# Patient Record
Sex: Female | Born: 1940
Health system: Southern US, Community
[De-identification: ages and names within clinical notes are randomized; demographics above are authoritative.]

## PROBLEM LIST (undated history)

## (undated) DIAGNOSIS — I10 Essential (primary) hypertension: Secondary | ICD-10-CM

## (undated) DIAGNOSIS — E785 Hyperlipidemia, unspecified: Secondary | ICD-10-CM

## (undated) DIAGNOSIS — Z8673 Personal history of transient ischemic attack (TIA), and cerebral infarction without residual deficits: Secondary | ICD-10-CM

## (undated) DIAGNOSIS — I272 Pulmonary hypertension, unspecified: Secondary | ICD-10-CM

## (undated) DIAGNOSIS — E1165 Type 2 diabetes mellitus with hyperglycemia: Secondary | ICD-10-CM

## (undated) DIAGNOSIS — E1159 Type 2 diabetes mellitus with other circulatory complications: Secondary | ICD-10-CM

## (undated) DIAGNOSIS — E119 Type 2 diabetes mellitus without complications: Secondary | ICD-10-CM

## (undated) HISTORY — DX: Type 2 diabetes mellitus without complications: E11.9

## (undated) HISTORY — DX: Hyperlipidemia, unspecified: E78.5

## (undated) HISTORY — PX: ABDOMINAL HYSTERECTOMY: SHX81

## (undated) HISTORY — DX: Essential (primary) hypertension: I10

## (undated) HISTORY — DX: Personal history of transient ischemic attack (TIA), and cerebral infarction without residual deficits: Z86.73

## (undated) HISTORY — DX: Pulmonary hypertension, unspecified: I27.20

---

## 1998-03-27 ENCOUNTER — Emergency Department (HOSPITAL_COMMUNITY): Admission: EM | Admit: 1998-03-27 | Discharge: 1998-03-27 | Payer: Self-pay | Admitting: Emergency Medicine

## 1998-03-28 ENCOUNTER — Encounter: Payer: Self-pay | Admitting: Emergency Medicine

## 1998-11-15 ENCOUNTER — Emergency Department (HOSPITAL_COMMUNITY): Admission: EM | Admit: 1998-11-15 | Discharge: 1998-11-15 | Payer: Self-pay | Admitting: Emergency Medicine

## 1998-11-15 ENCOUNTER — Encounter: Payer: Self-pay | Admitting: Emergency Medicine

## 2000-04-10 DIAGNOSIS — Z8673 Personal history of transient ischemic attack (TIA), and cerebral infarction without residual deficits: Secondary | ICD-10-CM

## 2000-04-10 HISTORY — DX: Personal history of transient ischemic attack (TIA), and cerebral infarction without residual deficits: Z86.73

## 2000-08-23 ENCOUNTER — Encounter: Payer: Self-pay | Admitting: Emergency Medicine

## 2000-08-23 ENCOUNTER — Emergency Department (HOSPITAL_COMMUNITY): Admission: EM | Admit: 2000-08-23 | Discharge: 2000-08-23 | Payer: Self-pay

## 2000-08-27 ENCOUNTER — Ambulatory Visit (HOSPITAL_COMMUNITY): Admission: RE | Admit: 2000-08-27 | Discharge: 2000-08-27 | Payer: Self-pay | Admitting: Neurology

## 2000-09-04 ENCOUNTER — Ambulatory Visit (HOSPITAL_COMMUNITY): Admission: RE | Admit: 2000-09-04 | Discharge: 2000-09-04 | Payer: Self-pay | Admitting: Neurology

## 2000-09-25 ENCOUNTER — Encounter: Admission: RE | Admit: 2000-09-25 | Discharge: 2000-11-07 | Payer: Self-pay | Admitting: Family Medicine

## 2000-10-10 ENCOUNTER — Emergency Department (HOSPITAL_COMMUNITY): Admission: EM | Admit: 2000-10-10 | Discharge: 2000-10-11 | Payer: Self-pay

## 2001-10-05 ENCOUNTER — Emergency Department (HOSPITAL_COMMUNITY): Admission: EM | Admit: 2001-10-05 | Discharge: 2001-10-05 | Payer: Self-pay | Admitting: *Deleted

## 2002-04-11 ENCOUNTER — Encounter: Payer: Self-pay | Admitting: Internal Medicine

## 2002-04-11 ENCOUNTER — Encounter: Admission: RE | Admit: 2002-04-11 | Discharge: 2002-04-11 | Payer: Self-pay | Admitting: Internal Medicine

## 2002-05-20 ENCOUNTER — Encounter: Admission: RE | Admit: 2002-05-20 | Discharge: 2002-05-20 | Payer: Self-pay | Admitting: Internal Medicine

## 2002-05-20 ENCOUNTER — Encounter: Payer: Self-pay | Admitting: Internal Medicine

## 2003-03-17 HISTORY — PX: CARDIAC CATHETERIZATION: SHX172

## 2004-06-24 ENCOUNTER — Encounter: Admission: RE | Admit: 2004-06-24 | Discharge: 2004-06-24 | Payer: Self-pay | Admitting: Internal Medicine

## 2004-07-22 ENCOUNTER — Emergency Department (HOSPITAL_COMMUNITY): Admission: EM | Admit: 2004-07-22 | Discharge: 2004-07-22 | Payer: Self-pay | Admitting: Family Medicine

## 2005-02-28 ENCOUNTER — Emergency Department (HOSPITAL_COMMUNITY): Admission: EM | Admit: 2005-02-28 | Discharge: 2005-02-28 | Payer: Self-pay | Admitting: Emergency Medicine

## 2005-02-28 ENCOUNTER — Emergency Department (HOSPITAL_COMMUNITY): Admission: EM | Admit: 2005-02-28 | Discharge: 2005-03-01 | Payer: Self-pay | Admitting: Emergency Medicine

## 2005-11-22 ENCOUNTER — Encounter: Admission: RE | Admit: 2005-11-22 | Discharge: 2005-11-22 | Payer: Self-pay | Admitting: Internal Medicine

## 2006-03-07 ENCOUNTER — Ambulatory Visit (HOSPITAL_COMMUNITY): Admission: RE | Admit: 2006-03-07 | Discharge: 2006-03-07 | Payer: Self-pay | Admitting: Internal Medicine

## 2006-08-28 HISTORY — PX: CARDIOVASCULAR STRESS TEST: SHX262

## 2007-01-31 ENCOUNTER — Ambulatory Visit (HOSPITAL_COMMUNITY): Admission: RE | Admit: 2007-01-31 | Discharge: 2007-01-31 | Payer: Self-pay | Admitting: Family Medicine

## 2007-02-16 ENCOUNTER — Emergency Department (HOSPITAL_COMMUNITY): Admission: EM | Admit: 2007-02-16 | Discharge: 2007-02-16 | Payer: Self-pay | Admitting: Family Medicine

## 2007-09-20 ENCOUNTER — Encounter: Admission: RE | Admit: 2007-09-20 | Discharge: 2007-09-20 | Payer: Self-pay | Admitting: Internal Medicine

## 2007-10-27 ENCOUNTER — Emergency Department (HOSPITAL_COMMUNITY): Admission: EM | Admit: 2007-10-27 | Discharge: 2007-10-27 | Payer: Self-pay | Admitting: Family Medicine

## 2008-06-21 ENCOUNTER — Emergency Department (HOSPITAL_COMMUNITY): Admission: EM | Admit: 2008-06-21 | Discharge: 2008-06-21 | Payer: Self-pay | Admitting: Family Medicine

## 2010-02-17 HISTORY — PX: TRANSTHORACIC ECHOCARDIOGRAM: SHX275

## 2010-11-28 ENCOUNTER — Inpatient Hospital Stay (INDEPENDENT_AMBULATORY_CARE_PROVIDER_SITE_OTHER)
Admission: RE | Admit: 2010-11-28 | Discharge: 2010-11-28 | Disposition: A | Discharge status: Home / Self Care | Payer: Medicare Other | Source: Ambulatory Visit | Attending: Family Medicine | Admitting: Family Medicine

## 2010-11-28 DIAGNOSIS — E119 Type 2 diabetes mellitus without complications: Secondary | ICD-10-CM

## 2010-11-28 DIAGNOSIS — W57XXXA Bitten or stung by nonvenomous insect and other nonvenomous arthropods, initial encounter: Secondary | ICD-10-CM

## 2010-11-28 DIAGNOSIS — T148 Other injury of unspecified body region: Secondary | ICD-10-CM

## 2011-01-19 ENCOUNTER — Inpatient Hospital Stay (INDEPENDENT_AMBULATORY_CARE_PROVIDER_SITE_OTHER)
Admission: RE | Admit: 2011-01-19 | Discharge: 2011-01-19 | Disposition: A | Discharge status: Home / Self Care | Payer: Medicare Other | Source: Ambulatory Visit | Attending: Emergency Medicine | Admitting: Emergency Medicine

## 2011-01-19 DIAGNOSIS — J029 Acute pharyngitis, unspecified: Secondary | ICD-10-CM

## 2011-03-14 ENCOUNTER — Other Ambulatory Visit: Payer: Self-pay | Admitting: Internal Medicine

## 2011-03-14 DIAGNOSIS — Z1231 Encounter for screening mammogram for malignant neoplasm of breast: Secondary | ICD-10-CM

## 2011-04-19 ENCOUNTER — Ambulatory Visit
Admission: RE | Admit: 2011-04-19 | Discharge: 2011-04-19 | Disposition: A | Discharge status: Home / Self Care | Payer: Medicare Other | Source: Ambulatory Visit | Attending: Internal Medicine | Admitting: Internal Medicine

## 2011-04-19 DIAGNOSIS — Z1231 Encounter for screening mammogram for malignant neoplasm of breast: Secondary | ICD-10-CM

## 2011-08-07 DIAGNOSIS — H2510 Age-related nuclear cataract, unspecified eye: Secondary | ICD-10-CM | POA: Insufficient documentation

## 2011-08-07 DIAGNOSIS — H401133 Primary open-angle glaucoma, bilateral, severe stage: Secondary | ICD-10-CM | POA: Insufficient documentation

## 2012-03-26 ENCOUNTER — Ambulatory Visit: Payer: Medicare Other | Admitting: Dietician

## 2012-04-19 ENCOUNTER — Ambulatory Visit: Payer: Medicare Other | Admitting: Dietician

## 2012-05-08 ENCOUNTER — Ambulatory Visit: Payer: Medicare Other | Admitting: Dietician

## 2012-08-22 DIAGNOSIS — E11311 Type 2 diabetes mellitus with unspecified diabetic retinopathy with macular edema: Secondary | ICD-10-CM | POA: Insufficient documentation

## 2012-08-22 DIAGNOSIS — H269 Unspecified cataract: Secondary | ICD-10-CM | POA: Insufficient documentation

## 2012-08-22 DIAGNOSIS — E113311 Type 2 diabetes mellitus with moderate nonproliferative diabetic retinopathy with macular edema, right eye: Secondary | ICD-10-CM | POA: Insufficient documentation

## 2012-08-22 HISTORY — DX: Type 2 diabetes mellitus with unspecified diabetic retinopathy with macular edema: E11.311

## 2012-08-29 ENCOUNTER — Other Ambulatory Visit: Payer: Self-pay

## 2012-08-29 DIAGNOSIS — Z1231 Encounter for screening mammogram for malignant neoplasm of breast: Secondary | ICD-10-CM

## 2012-10-07 ENCOUNTER — Ambulatory Visit
Admission: RE | Admit: 2012-10-07 | Discharge: 2012-10-07 | Disposition: A | Discharge status: Home / Self Care | Payer: Medicare Other | Source: Ambulatory Visit

## 2012-10-07 DIAGNOSIS — Z1231 Encounter for screening mammogram for malignant neoplasm of breast: Secondary | ICD-10-CM

## 2013-01-22 ENCOUNTER — Encounter: Payer: Self-pay | Admitting: Cardiology

## 2013-01-22 DIAGNOSIS — E1165 Type 2 diabetes mellitus with hyperglycemia: Secondary | ICD-10-CM | POA: Insufficient documentation

## 2013-01-22 DIAGNOSIS — E119 Type 2 diabetes mellitus without complications: Secondary | ICD-10-CM | POA: Insufficient documentation

## 2013-01-22 DIAGNOSIS — E1159 Type 2 diabetes mellitus with other circulatory complications: Secondary | ICD-10-CM | POA: Insufficient documentation

## 2013-01-22 DIAGNOSIS — E785 Hyperlipidemia, unspecified: Secondary | ICD-10-CM

## 2013-01-22 DIAGNOSIS — I10 Essential (primary) hypertension: Secondary | ICD-10-CM | POA: Insufficient documentation

## 2013-01-24 ENCOUNTER — Ambulatory Visit: Payer: Medicare Other | Admitting: Cardiovascular Disease

## 2013-02-06 ENCOUNTER — Ambulatory Visit (INDEPENDENT_AMBULATORY_CARE_PROVIDER_SITE_OTHER): Payer: Medicare Other | Admitting: Cardiovascular Disease

## 2013-02-06 ENCOUNTER — Encounter: Payer: Self-pay | Admitting: Cardiovascular Disease

## 2013-02-06 VITALS — BP 160/90 | HR 74 | Ht 62.0 in | Wt 145.5 lb

## 2013-02-06 DIAGNOSIS — I519 Heart disease, unspecified: Secondary | ICD-10-CM

## 2013-02-06 DIAGNOSIS — I5189 Other ill-defined heart diseases: Secondary | ICD-10-CM

## 2013-02-06 DIAGNOSIS — I69359 Hemiplegia and hemiparesis following cerebral infarction affecting unspecified side: Secondary | ICD-10-CM

## 2013-02-06 DIAGNOSIS — I10 Essential (primary) hypertension: Secondary | ICD-10-CM

## 2013-02-06 DIAGNOSIS — E785 Hyperlipidemia, unspecified: Secondary | ICD-10-CM

## 2013-02-06 DIAGNOSIS — E119 Type 2 diabetes mellitus without complications: Secondary | ICD-10-CM

## 2013-02-06 DIAGNOSIS — I517 Cardiomegaly: Secondary | ICD-10-CM

## 2013-02-06 DIAGNOSIS — I69959 Hemiplegia and hemiparesis following unspecified cerebrovascular disease affecting unspecified side: Secondary | ICD-10-CM

## 2013-02-06 HISTORY — DX: Other ill-defined heart diseases: I51.89

## 2013-02-06 HISTORY — DX: Hemiplegia and hemiparesis following cerebral infarction affecting unspecified side: I69.359

## 2013-02-06 MED ORDER — METOPROLOL SUCCINATE ER 25 MG PO TB24: 25.0000 mg | ORAL_TABLET | Freq: Every day | ORAL | Status: DC

## 2013-02-06 NOTE — Patient Instructions (Signed)
Your physician recommends that you schedule a follow-up appointment in: 6 weeks  Your physician has requested that you have an echocardiogram. Echocardiography is a painless test that uses sound waves to create images of your heart. It provides your doctor with information about the size and shape of your heart and how well your heart's chambers and valves are working. This procedure takes approximately one hour. There are no restrictions for this procedure.  Your physician has recommended you make the following change in your medication: Start Metoprolol 25 mg daily

## 2013-02-06 NOTE — Progress Notes (Signed)
Patient ID: MENA BORGMAN, female   DOB: 1941/02/02, 72 y.o.   MRN: [id number]     HPI: AASHNA MACHA is a 72 y.o. female who presents for one year cardiology evaluation. She is a former patient of Dr. Melvern Banker last saw him in June 2008.  Ms. Gassert has a remote history of CVA which he suffered in 2002. In 2004 Dr. Melvern Banker performed cardiac catheterization which revealed normal coronary arteries. In May 2008 a nuclear perfusion study showed normal perfusion. An echo Doppler study in November 2011 revealed asymmetric left ventricular hypertrophy with grade 1 diastolic dysfunction and increased LA pressure. She did have mild to moderate mitral annular calcification with trace MR and mild pulmonary hypertension.  She has had diabetes that have been out of control and last year her hemoglobin and see if there is a 12.3. She also has a history of hyperlipidemia.  Over the past year, she denies recent episodes of chest pain. She denies shortness of breath. She states that her blood pressure times is labile. She presents for evaluation.  Past Medical History  Diagnosis Date  . H/O: CVA (cerebrovascular accident) 2002  . Pulmonary HTN     mild by echo  . Diabetes mellitus   . Hyperlipidemia   . HTN (hypertension)     Past Surgical History  Procedure Laterality Date  . Cardiac catheterization  03/17/2003    Normal, patent coronary arteries. Normal LV systolic function  . Cardiovascular stress test  08/28/2006    Small sized, moderate ischemiain the Basal Inferoseptal, Basal Inferior, Mid Inferior and Apical Inferior regions  . Transthoracic echocardiogram  02/17/2010    EF >55%, moderate-severely thickened septum, mild-moderate mitral annular calcification    No Known Allergies  Current Outpatient Prescriptions  Medication Sig Dispense Refill  . ALPHAGAN P 0.1 % SOLN       . amLODipine (NORVASC) 10 MG tablet Take 10 mg by mouth daily.      Marland Kitchen aspirin 325 MG EC tablet Take 325 mg by  mouth daily.      Marland Kitchen FLUZONE HIGH-DOSE injection       . hydrOXYzine (ATARAX/VISTARIL) 25 MG tablet Take 25 mg by mouth as needed for itching.      . insulin detemir (LEVEMIR) 100 UNIT/ML injection Inject 15 Units into the skin daily.      . magnesium oxide (MAG-OX) 400 MG tablet Take 400 mg by mouth daily.      . metFORMIN (GLUCOPHAGE) 1000 MG tablet Take 1,000 mg by mouth 2 (two) times daily with a meal.      . Multiple Vitamin (MULTIVITAMIN) tablet Take 1 tablet by mouth daily.      . pantoprazole (PROTONIX) 40 MG tablet Take 40 mg by mouth daily.      . rosuvastatin (CRESTOR) 10 MG tablet Take 10 mg by mouth daily.      Marland Kitchen TIMOLOL MALEATE OP Place 1 drop into both eyes daily.      . valsartan-hydrochlorothiazide (DIOVAN-HCT) 320-25 MG per tablet Take 1 tablet by mouth daily.      . metoprolol succinate (TOPROL XL) 25 MG 24 hr tablet Take 1 tablet (25 mg total) by mouth daily.  30 tablet  6  . potassium chloride SA (K-DUR,KLOR-CON) 20 MEQ tablet Take 1 tablet by mouth daily.       No current facility-administered medications for this visit.    History   Social History  . Marital Status: Married    Spouse Name: N/A  Number of Children: N/A  . Years of Education: N/A   Occupational History  . Not on file.   Social History Main Topics  . Smoking status: Never Smoker   . Smokeless tobacco: Never Used  . Alcohol Use: No  . Drug Use: Not on file  . Sexual Activity: Not on file   Other Topics Concern  . Not on file   Social History Narrative  . No narrative on file   Social history is notable in that she is married has 8 children 14 grandchildren 3 great-grandchildren. She does try to walk on the treadmill. There is no tobacco or alcohol use. History reviewed. No pertinent family history.  ROS is negative for fevers, chills or night sweats. She denies visual symptoms she denies PND or orthopnea. She denies wheezing. She denies tachycardia palpitations, presyncope or syncope.  She denies chest pressure. She denies rash. She denies bleeding issues. She denies change in bowel or bladder habits. She denies urine symptoms. There is no blood in her stool. She denies significant edema. Denies claudication. She does have diabetes and sees Dr. Baird Cancer who is been monitoring her blood sugar and laboratory and has seen Dr. Jeanann Lewandowsky.  Other comprehensive 12 point system review is negative.  PE BP 160/90  Pulse 74  Ht 5\' 2"  (1.575 m)  Wt 145 lb 8 oz (65.998 kg)  BMI 26.61 kg/m2 repeat blood pressure 170/94 General: Alert, oriented, no distress.  Skin: normal turgor, no rashes HEENT: Normocephalic, atraumatic. Pupils round and reactive; sclera anicteric;no lid lag.  Nose without nasal septal hypertrophy Mouth/Parynx benign; Mallinpatti scale 3 Neck: No JVD, no carotid briuts Lungs: clear to ausculatation and percussion; no wheezing or rales Heart: RRR, s1 s2 normal 1/6 systolic murmur Abdomen: soft, nontender; no hepatosplenomehaly, BS+; abdominal aorta nontender and not dilated by palpation. Pulses 2+ Extremities: no clubbing cyanosis or edema, Homan's sign negative  Neurologic: grossly nonfocal Psychologic: normal affect and mood.  ECG: Sinus rhythm with left axis deviation heart rate 74. Poor progression anteriorly.  LABS:  BMET No results found for this basename: na, k, cl, co2, glucose, bun, creatinine, calcium, gfrnonaa, gfraa     Hepatic Function Panel  No results found for this basename: prot, albumin, ast, alt, alkphos, bilitot, bilidir, ibili     CBC No results found for this basename: wbc, rbc, hgb, hct, plt, mcv, mch, mchc, rdw, neutrabs, lymphsabs, monoabs, eosabs, basosabs     BNP No results found for this basename: probnp    Lipid Panel  No results found for this basename: chol, trig, hdl, cholhdl, vldl, ldlcalc     RADIOLOGY: No results found.    ASSESSMENT AND PLAN: Ms. Kristof is a 72 year old Afro-American female who  suffered a left CVA 12 years ago in 2002. She has been Dr. have normal coronary arteries at heart catheterization done by Dr. Melvern Banker. She does have asymmetric left ventricular hypertrophy with grade 1 diastolic dysfunction with increased LA pressure. Her last echo Doppler study was November 2011 her blood pressure today is elevated. I am recommending initiation of Toprol XL 25 mg to take in addition to her ready taking Diovan HCT 320/25, amlodipine 10 mg. She is on Crestor for hyperlipidemia, pantoprazole for GERD, and metformin 1000 twice a day for diabetes mellitus. I am scheduling her for three-year followup echo Doppler study. I will see her back in the office in 6 weeks for followup evaluation.     Troy Sine, MD, [facility]  02/06/2013 2:48  PM

## 2013-02-25 ENCOUNTER — Ambulatory Visit (HOSPITAL_COMMUNITY): Payer: Medicare Other

## 2013-03-20 ENCOUNTER — Ambulatory Visit: Payer: Medicare Other | Admitting: Cardiovascular Disease

## 2013-03-31 ENCOUNTER — Encounter: Payer: Self-pay | Admitting: Cardiovascular Disease

## 2013-09-10 ENCOUNTER — Other Ambulatory Visit: Payer: Self-pay

## 2013-09-10 MED ORDER — METOPROLOL SUCCINATE ER 25 MG PO TB24: 25.0000 mg | ORAL_TABLET | Freq: Every day | ORAL | Status: DC

## 2013-09-10 NOTE — Telephone Encounter (Signed)
Rx was sent to pharmacy electronically. 

## 2013-11-24 ENCOUNTER — Other Ambulatory Visit: Payer: Self-pay

## 2013-11-24 DIAGNOSIS — Z1231 Encounter for screening mammogram for malignant neoplasm of breast: Secondary | ICD-10-CM

## 2013-12-11 ENCOUNTER — Ambulatory Visit
Admission: RE | Admit: 2013-12-11 | Discharge: 2013-12-11 | Disposition: A | Discharge status: Home / Self Care | Payer: Medicare Other | Source: Ambulatory Visit

## 2013-12-11 ENCOUNTER — Encounter (INDEPENDENT_AMBULATORY_CARE_PROVIDER_SITE_OTHER): Payer: Self-pay

## 2013-12-11 DIAGNOSIS — Z1231 Encounter for screening mammogram for malignant neoplasm of breast: Secondary | ICD-10-CM

## 2013-12-31 ENCOUNTER — Encounter: Payer: Self-pay | Admitting: Podiatry

## 2014-01-01 ENCOUNTER — Encounter: Payer: Self-pay | Admitting: Podiatry

## 2014-01-01 ENCOUNTER — Ambulatory Visit (INDEPENDENT_AMBULATORY_CARE_PROVIDER_SITE_OTHER): Payer: Medicare Other | Admitting: Podiatry

## 2014-01-01 DIAGNOSIS — B351 Tinea unguium: Secondary | ICD-10-CM

## 2014-01-01 DIAGNOSIS — M79673 Pain in unspecified foot: Secondary | ICD-10-CM

## 2014-01-01 DIAGNOSIS — M79609 Pain in unspecified limb: Secondary | ICD-10-CM

## 2014-01-01 NOTE — Patient Instructions (Signed)
Diabetes and Foot Care Diabetes may cause you to have problems because of poor blood supply (circulation) to your feet and legs. This may cause the skin on your feet to become thinner, break easier, and heal more slowly. Your skin may become dry, and the skin may peel and crack. You may also have nerve damage in your legs and feet causing decreased feeling in them. You may not notice minor injuries to your feet that could lead to infections or more serious problems. Taking care of your feet is one of the most important things you can do for yourself.  HOME CARE INSTRUCTIONS  Wear shoes at all times, even in the house. Do not go barefoot. Bare feet are easily injured.  Check your feet daily for blisters, cuts, and redness. If you cannot see the bottom of your feet, use a mirror or ask someone for help.  Wash your feet with warm water (do not use hot water) and mild soap. Then pat your feet and the areas between your toes until they are completely dry. Do not soak your feet as this can dry your skin.  Apply a moisturizing lotion or petroleum jelly (that does not contain alcohol and is unscented) to the skin on your feet and to dry, brittle toenails. Do not apply lotion between your toes.  Trim your toenails straight across. Do not dig under them or around the cuticle. File the edges of your nails with an emery board or nail file.  Do not cut corns or calluses or try to remove them with medicine.  Wear clean socks or stockings every day. Make sure they are not too tight. Do not wear knee-high stockings since they may decrease blood flow to your legs.  Wear shoes that fit properly and have enough cushioning. To break in new shoes, wear them for just a few hours a day. This prevents you from injuring your feet. Always look in your shoes before you put them on to be sure there are no objects inside.  Do not cross your legs. This may decrease the blood flow to your feet.  If you find a minor scrape,  cut, or break in the skin on your feet, keep it and the skin around it clean and dry. These areas may be cleansed with mild soap and water. Do not cleanse the area with peroxide, alcohol, or iodine.  When you remove an adhesive bandage, be sure not to damage the skin around it.  If you have a wound, look at it several times a day to make sure it is healing.  Do not use heating pads or hot water bottles. They may burn your skin. If you have lost feeling in your feet or legs, you may not know it is happening until it is too late.  Make sure your health care provider performs a complete foot exam at least annually or more often if you have foot problems. Report any cuts, sores, or bruises to your health care provider immediately. SEEK MEDICAL CARE IF:   You have an injury that is not healing.  You have cuts or breaks in the skin.  You have an ingrown nail.  You notice redness on your legs or feet.  You feel burning or tingling in your legs or feet.  You have pain or cramps in your legs and feet.  Your legs or feet are numb.  Your feet always feel cold. SEEK IMMEDIATE MEDICAL CARE IF:   There is increasing redness,   swelling, or pain in or around a wound.  There is a red line that goes up your leg.  Pus is coming from a wound.  You develop a fever or as directed by your health care provider.  You notice a bad smell coming from an ulcer or wound. Document Released: 03/24/2000 Document Revised: 11/27/2012 Document Reviewed: 09/03/2012 Exit[employer] Patient Information 2015 [employer]. This information is not intended to replace advice given to you by your health care provider. Make sure you discuss any questions you have with your health care provider.  

## 2014-01-02 NOTE — Progress Notes (Signed)
Subjective:     Patient ID: [name] [name], female   DOB: 02/22/1941, 73 y.o.   MRN: [id number]  HPI patient presents with nail disease 1-5 both feet that's painful and also just have some concerns about tingling of her feet   Review of Systems     Objective:   Physical Exam Neurovascular status unchanged with thick yellow brittle nailbeds 1-5 both feet that are painful and she cannot cut. It seems that the tingling is very sporadic and only bothers her on a very occasional basis    Assessment:     Mycotic nail infection with pain with possible neuropathic like symptoms that are very early stay    Plan:     If symptoms persist she will let us know we may consider medications but today we did debridement of nailbeds 1-5 both feet with no iatrogenic bleeding noted

## 2014-01-26 ENCOUNTER — Other Ambulatory Visit: Payer: Self-pay

## 2014-01-26 MED ORDER — METOPROLOL SUCCINATE ER 25 MG PO TB24: 25.0000 mg | ORAL_TABLET | Freq: Every day | ORAL | Status: DC

## 2014-01-26 NOTE — Telephone Encounter (Signed)
Rx was sent to pharmacy electronically. 

## 2014-03-30 ENCOUNTER — Ambulatory Visit (INDEPENDENT_AMBULATORY_CARE_PROVIDER_SITE_OTHER): Payer: Medicare Other | Admitting: Podiatry

## 2014-03-30 DIAGNOSIS — B351 Tinea unguium: Secondary | ICD-10-CM

## 2014-03-30 DIAGNOSIS — M79673 Pain in unspecified foot: Secondary | ICD-10-CM

## 2014-03-30 NOTE — Progress Notes (Signed)
Subjective:     Patient ID: [name] [name], female   DOB: 07-05-1940, 72 y.o.   MRN: [id number]  HPI patient presents with nail disease 1-5 both feet that's painful and also just have some concerns about tingling of her feet   Review of Systems     Objective:   Physical Exam Neurovascular status unchanged with thick yellow brittle nailbeds 1-5 both feet that are painful and she cannot cut. It seems that the tingling is very sporadic and only bothers her on a very occasional basis    Assessment:     Mycotic nail infection with pain with possible neuropathic like symptoms that are very early stay    Plan:     If symptoms persist she will let us know we may consider medications but today we did debridement of nailbeds 1-5 both feet with no iatrogenic bleeding noted

## 2014-04-06 ENCOUNTER — Other Ambulatory Visit: Payer: Self-pay

## 2014-04-06 MED ORDER — METOPROLOL SUCCINATE ER 25 MG PO TB24: 25.0000 mg | ORAL_TABLET | Freq: Every day | ORAL | Status: DC

## 2014-04-06 NOTE — Telephone Encounter (Signed)
Rx sent to pharmacy   

## 2014-06-29 ENCOUNTER — Ambulatory Visit: Payer: Medicare Other

## 2014-08-17 ENCOUNTER — Ambulatory Visit: Payer: Self-pay

## 2014-08-21 ENCOUNTER — Ambulatory Visit: Payer: Self-pay

## 2014-08-28 ENCOUNTER — Ambulatory Visit (INDEPENDENT_AMBULATORY_CARE_PROVIDER_SITE_OTHER): Payer: Medicare Other

## 2014-08-28 DIAGNOSIS — M79675 Pain in left toe(s): Secondary | ICD-10-CM | POA: Diagnosis not present

## 2014-08-28 DIAGNOSIS — M79674 Pain in right toe(s): Secondary | ICD-10-CM

## 2014-08-28 DIAGNOSIS — B351 Tinea unguium: Secondary | ICD-10-CM | POA: Diagnosis not present

## 2014-08-28 NOTE — Progress Notes (Signed)
Patient ID: [name] [name], female   DOB: 23-May-1940, 74 y.o.   MRN: [id number] Complaint:  Visit Type: Patient returns to my office for continued preventative foot care services. Complaint: Patient states" my nails have grown long and thick and become painful to walk and wear shoes" Patient has been diagnosed with DM with no complications. He presents for preventative foot care services. No changes to ROS  Podiatric Exam: Vascular: dorsalis pedis and posterior tibial pulses are palpable bilateral. Capillary return is immediate. Temperature gradient is WNL. Skin turgor WNL  Sensorium: Normal Semmes Weinstein monofilament test. Normal tactile sensation bilaterally. Nail Exam: Pt has thick disfigured discolored nails with subungual debris noted bilateral entire nail hallux through fifth toenails Ulcer Exam: There is no evidence of ulcer or pre-ulcerative changes or infection. Orthopedic Exam: Muscle tone and strength are WNL. No limitations in general ROM. No crepitus or effusions noted. Foot type and digits show no abnormalities. Bony prominences are unremarkable. Skin: No Porokeratosis. No infection or ulcers  Diagnosis:  Tinea unguium, Pain in right toe, pain in left toes  Treatment & Plan Procedures and Treatment: Consent by patient was obtained for treatment procedures. The patient understood the discussion of treatment and procedures well. All questions were answered thoroughly reviewed. Debridement of mycotic and hypertrophic toenails, 1 through 5 bilateral and clearing of subungual debris. No ulceration, no infection noted.  Return Visit-Office Procedure: Patient instructed to return to the office for a follow up visit 3 months for continued evaluation and treatment.

## 2014-12-03 ENCOUNTER — Ambulatory Visit: Payer: Medicare Other | Admitting: Podiatry

## 2014-12-18 ENCOUNTER — Encounter: Payer: Self-pay | Admitting: Podiatry

## 2014-12-18 ENCOUNTER — Ambulatory Visit (INDEPENDENT_AMBULATORY_CARE_PROVIDER_SITE_OTHER): Payer: Medicare Other | Admitting: Podiatry

## 2014-12-18 DIAGNOSIS — B351 Tinea unguium: Secondary | ICD-10-CM

## 2014-12-18 DIAGNOSIS — M79673 Pain in unspecified foot: Secondary | ICD-10-CM | POA: Diagnosis not present

## 2014-12-18 NOTE — Progress Notes (Signed)
Patient ID: [name] [name], female   DOB: 03-04-1941, 74 y.o.   MRN: [id number] Complaint:  Visit Type: Patient returns to my office for continued preventative foot care services. Complaint: Patient states" my nails have grown long and thick and become painful to walk and wear shoes" Patient has been diagnosed with DM with no complications. He presents for preventative foot care services. No changes to ROS  Podiatric Exam: Vascular: dorsalis pedis and posterior tibial pulses are palpable bilateral. Capillary return is immediate. Temperature gradient is WNL. Skin turgor WNL  Sensorium: Normal Semmes Weinstein monofilament test. Normal tactile sensation bilaterally. Nail Exam: Pt has thick disfigured discolored nails with subungual debris noted bilateral entire nail hallux through fifth toenails Ulcer Exam: There is no evidence of ulcer or pre-ulcerative changes or infection. Orthopedic Exam: Muscle tone and strength are WNL. No limitations in general ROM. No crepitus or effusions noted. Foot type and digits show no abnormalities. Bony prominences are unremarkable. Skin: No Porokeratosis. No infection or ulcers  Diagnosis:  Tinea unguium, Pain in right toe, pain in left toes  Treatment & Plan Procedures and Treatment: Consent by patient was obtained for treatment procedures. The patient understood the discussion of treatment and procedures well. All questions were answered thoroughly reviewed. Debridement of mycotic and hypertrophic toenails, 1 through 5 bilateral and clearing of subungual debris. No ulceration, no infection noted.  Return Visit-Office Procedure: Patient instructed to return to the office for a follow up visit 3 months for continued evaluation and treatment.

## 2015-03-16 ENCOUNTER — Ambulatory Visit: Payer: Medicare Other | Admitting: Podiatry

## 2015-04-16 ENCOUNTER — Encounter: Payer: Self-pay | Admitting: Podiatry

## 2015-04-16 ENCOUNTER — Ambulatory Visit (INDEPENDENT_AMBULATORY_CARE_PROVIDER_SITE_OTHER): Payer: Medicare Other | Admitting: Podiatry

## 2015-04-16 DIAGNOSIS — B351 Tinea unguium: Secondary | ICD-10-CM | POA: Diagnosis not present

## 2015-04-16 DIAGNOSIS — M79673 Pain in unspecified foot: Secondary | ICD-10-CM

## 2015-04-16 NOTE — Progress Notes (Signed)
Patient ID: [name] [name], female   DOB: 01/07/1941, 75 y.o.   MRN: [id number] Complaint:  Visit Type: Patient returns to my office for continued preventative foot care services. Complaint: Patient states" my nails have grown long and thick and become painful to walk and wear shoes" Patient has been diagnosed with DM with no complications. He presents for preventative foot care services. No changes to ROS  Podiatric Exam: Vascular: dorsalis pedis and posterior tibial pulses are palpable bilateral. Capillary return is immediate. Temperature gradient is WNL. Skin turgor WNL  Sensorium: Normal Semmes Weinstein monofilament test. Normal tactile sensation bilaterally. Nail Exam: Pt has thick disfigured discolored nails with subungual debris noted bilateral entire nail hallux through fifth toenails Ulcer Exam: There is no evidence of ulcer or pre-ulcerative changes or infection. Orthopedic Exam: Muscle tone and strength are WNL. No limitations in general ROM. No crepitus or effusions noted. Foot type and digits show no abnormalities. Bony prominences are unremarkable. Skin: No Porokeratosis. No infection or ulcers  Diagnosis:  Tinea unguium, Pain in right toe, pain in left toes  Treatment & Plan Procedures and Treatment: Consent by patient was obtained for treatment procedures. The patient understood the discussion of treatment and procedures well. All questions were answered thoroughly reviewed. Debridement of mycotic and hypertrophic toenails, 1 through 5 bilateral and clearing of subungual debris. No ulceration, no infection noted.  Return Visit-Office Procedure: Patient instructed to return to the office for a follow up visit 3 months for continued evaluation and treatment   Gardiner Barefoot DPM.

## 2015-05-10 DIAGNOSIS — I129 Hypertensive chronic kidney disease with stage 1 through stage 4 chronic kidney disease, or unspecified chronic kidney disease: Secondary | ICD-10-CM | POA: Diagnosis not present

## 2015-05-10 DIAGNOSIS — E1122 Type 2 diabetes mellitus with diabetic chronic kidney disease: Secondary | ICD-10-CM | POA: Diagnosis not present

## 2015-05-10 DIAGNOSIS — Z0000 Encounter for general adult medical examination without abnormal findings: Secondary | ICD-10-CM | POA: Diagnosis not present

## 2015-05-10 DIAGNOSIS — N182 Chronic kidney disease, stage 2 (mild): Secondary | ICD-10-CM | POA: Diagnosis not present

## 2015-05-10 DIAGNOSIS — N08 Glomerular disorders in diseases classified elsewhere: Secondary | ICD-10-CM | POA: Diagnosis not present

## 2015-05-10 DIAGNOSIS — E559 Vitamin D deficiency, unspecified: Secondary | ICD-10-CM | POA: Diagnosis not present

## 2015-05-12 ENCOUNTER — Encounter (HOSPITAL_COMMUNITY): Payer: Self-pay | Admitting: Emergency Medicine

## 2015-05-12 ENCOUNTER — Emergency Department (HOSPITAL_COMMUNITY)
Admission: EM | Admit: 2015-05-12 | Discharge: 2015-05-12 | Disposition: A | Discharge status: Home / Self Care | Payer: Medicare Other | Attending: Emergency Medicine | Admitting: Emergency Medicine

## 2015-05-12 ENCOUNTER — Emergency Department (HOSPITAL_COMMUNITY): Payer: Medicare Other

## 2015-05-12 DIAGNOSIS — Z7982 Long term (current) use of aspirin: Secondary | ICD-10-CM | POA: Diagnosis not present

## 2015-05-12 DIAGNOSIS — E119 Type 2 diabetes mellitus without complications: Secondary | ICD-10-CM | POA: Diagnosis not present

## 2015-05-12 DIAGNOSIS — Z79899 Other long term (current) drug therapy: Secondary | ICD-10-CM | POA: Diagnosis not present

## 2015-05-12 DIAGNOSIS — Z7984 Long term (current) use of oral hypoglycemic drugs: Secondary | ICD-10-CM | POA: Insufficient documentation

## 2015-05-12 DIAGNOSIS — E785 Hyperlipidemia, unspecified: Secondary | ICD-10-CM | POA: Diagnosis not present

## 2015-05-12 DIAGNOSIS — Z794 Long term (current) use of insulin: Secondary | ICD-10-CM | POA: Diagnosis not present

## 2015-05-12 DIAGNOSIS — M25551 Pain in right hip: Secondary | ICD-10-CM | POA: Diagnosis not present

## 2015-05-12 DIAGNOSIS — Z8673 Personal history of transient ischemic attack (TIA), and cerebral infarction without residual deficits: Secondary | ICD-10-CM | POA: Diagnosis not present

## 2015-05-12 DIAGNOSIS — Z9889 Other specified postprocedural states: Secondary | ICD-10-CM | POA: Insufficient documentation

## 2015-05-12 DIAGNOSIS — I10 Essential (primary) hypertension: Secondary | ICD-10-CM | POA: Diagnosis not present

## 2015-05-12 MED ORDER — HYDROCODONE-ACETAMINOPHEN 5-325 MG PO TABS: 1.0000 | ORAL_TABLET | Freq: Four times a day (QID) | ORAL | Status: DC | PRN

## 2015-05-12 MED ORDER — HYDROCODONE-ACETAMINOPHEN 5-325 MG PO TABS
1.0000 | ORAL_TABLET | Freq: Once | ORAL | Status: AC
  Administered 2015-05-12: 1 via ORAL
  Filled 2015-05-12: qty 1

## 2015-05-12 NOTE — Discharge Instructions (Signed)
Follow up with your family md next week if not improving

## 2015-05-12 NOTE — ED Notes (Signed)
Pt reports RT sided, lower back pain that radiates to left side. Pt states it increases with movement. Pt went to PCP on Monday, but states, "I never heard back." Pt denies recent injury. Denies urinary problems.

## 2015-05-12 NOTE — ED Notes (Signed)
Pt c/o pain in right hip radiating across lower back. Denies injury. Denies gi/gu sx. Pt seen by pcp Monday for same.

## 2015-05-12 NOTE — ED Provider Notes (Signed)
CSN: [id number]     Arrival date & time 05/12/15  [id number] History  By signing my name below, I, [name] [name], attest that this documentation has been prepared under the direction and in the presence of [name] Ferguson, MD. Electronically Signed: Judithann [name], ED Scribe. 05/12/2015. 8:07 AM.    Chief Complaint  Patient presents with  . Back Pain   Patient is a 75 y.o. female presenting with hip pain. The history is provided by the patient. No language interpreter was used.  Hip Pain This is a new problem. The current episode started more than 2 days ago. The problem occurs rarely. The problem has been gradually worsening. Pertinent negatives include no chest pain, no abdominal pain, no headaches and no shortness of breath. Nothing aggravates the symptoms. Nothing relieves the symptoms. She has tried nothing for the symptoms.   HPI Comments: [name] [name] is a 75 y.o. female who presents to the Emergency Department complaining of gradually worsening posterior right hip pain onset 5 days ago. She states that she saw her PCP 3 days ago for the same but denies getting an x-ray. She denies any recent falls, injuries, or trauma. No alleviating factors noted. Pt has not tried any medications. No fever, chills, SOB, or lacerations.   PCP: Dr. Baird [name], [name] [name]   Past Medical History  Diagnosis Date  . H/O: CVA (cerebrovascular accident) 2002  . Pulmonary HTN ([place])     mild by echo  . Diabetes mellitus ([name] [name])   . Hyperlipidemia   . HTN (hypertension)    Past Surgical History  Procedure Laterality Date  . Cardiac catheterization  03/17/2003    Normal, patent coronary arteries. Normal LV systolic function  . Cardiovascular stress test  08/28/2006    Small sized, moderate ischemiain the Basal Inferoseptal, Basal Inferior, Mid Inferior and Apical Inferior regions  . Transthoracic echocardiogram  02/17/2010    EF >55%, moderate-severely thickened septum, mild-moderate mitral annular  calcification  . Abdominal hysterectomy     No family history on file. Social History  Substance Use Topics  . Smoking status: Never Smoker   . Smokeless tobacco: Never Used  . Alcohol Use: No   OB History    No data available     Review of Systems  Constitutional: Negative for appetite change and fatigue.  HENT: Negative for congestion, ear discharge and sinus pressure.   Eyes: Negative for discharge.  Respiratory: Negative for cough and shortness of breath.   Cardiovascular: Negative for chest pain.  Gastrointestinal: Negative for abdominal pain and diarrhea.  Genitourinary: Negative for frequency and hematuria.  Musculoskeletal: Positive for arthralgias (posterior right hip). Negative for back pain.  Skin: Negative for rash.  Neurological: Negative for seizures and headaches.  Psychiatric/Behavioral: Negative for hallucinations.      Allergies  Brimonidine and Timolol  Home Medications   Prior to Admission medications   Medication Sig Start Date End Date Taking? Authorizing Provider  ACCU-CHEK AVIVA PLUS test strip  07/14/14   Historical Provider, MD  ALPHAGAN P 0.1 % SOLN  12/23/12   Historical Provider, MD  amLODipine (NORVASC) 10 MG tablet Take 10 mg by mouth daily.    Historical Provider, MD  aspirin 325 MG EC tablet Take 325 mg by mouth daily.    Historical Provider, MD  B-D UF III MINI PEN NEEDLES 31G X 5 MM MISC  07/14/14   Historical Provider, MD  FLUZONE HIGH-DOSE injection  12/31/12   Historical Provider, MD  hydrOXYzine (ATARAX/VISTARIL) 25 MG  tablet Take 25 mg by mouth as needed for itching.    Historical Provider, MD  insulin detemir (LEVEMIR) 100 UNIT/ML injection Inject 15 Units into the skin daily.    Historical Provider, MD  latanoprost (XALATAN) 0.005 % ophthalmic solution  07/17/14   Historical Provider, MD  LEVEMIR FLEXTOUCH 100 UNIT/ML Pen  07/14/14   Historical Provider, MD  magnesium oxide (MAG-OX) 400 MG tablet Take 400 mg by mouth daily.    Historical  Provider, MD  metFORMIN (GLUCOPHAGE) 1000 MG tablet Take 1,000 mg by mouth 2 (two) times daily with a meal.    Historical Provider, MD  metoprolol succinate (TOPROL XL) 25 MG 24 hr tablet Take 1 tablet (25 mg total) by mouth daily. NEED APPOINTMENT FOR FUTURE REFILLS. 04/06/14   Troy Sine, MD  Multiple Vitamin (MULTIVITAMIN) tablet Take 1 tablet by mouth daily.    Historical Provider, MD  pantoprazole (PROTONIX) 40 MG tablet Take 40 mg by mouth daily.    Historical Provider, MD  potassium chloride SA (K-DUR,KLOR-CON) 20 MEQ tablet Take 1 tablet by mouth daily. 01/03/13   Historical Provider, MD  rosuvastatin (CRESTOR) 10 MG tablet Take 10 mg by mouth daily.    Historical Provider, MD  TIMOLOL MALEATE OP Place 1 drop into both eyes daily.    Historical Provider, MD  valsartan-hydrochlorothiazide (DIOVAN-HCT) 320-25 MG per tablet Take 1 tablet by mouth daily.    Historical Provider, MD   BP 160/86 mmHg  Pulse 98  Temp(Src) 98.2 F (36.8 C) (Oral)  Resp 18  Ht 5' (1.524 m)  Wt 150 lb (68.04 kg)  BMI 29.30 kg/m2  SpO2 97% Physical Exam  Constitutional: She is oriented to person, place, and time. She appears well-developed.  HENT:  Head: Normocephalic.  Eyes: Conjunctivae are normal.  Neck: No tracheal deviation present.  Cardiovascular:  No murmur heard. Musculoskeletal: Normal range of motion. She exhibits tenderness.  Tenderness to posterior right hip Full ROM  Neurological: She is oriented to person, place, and time.  Skin: Skin is warm.  Psychiatric: She has a normal mood and affect.    ED Course  Procedures (including critical care time) DIAGNOSTIC STUDIES: Oxygen Saturation is 97% on RA, normal by my interpretation.    COORDINATION OF CARE: 8:03 AM- Pt advised of plan for treatment and pt agrees. Pt will receive x-ray for further evaluation. She will also receive pain medication.   Imaging Review No results found.   [name] Ferguson, MD has personally reviewed and  evaluated these images and lab results as part of his medical decision-making.   MDM   Final diagnoses:  None    X-ray unremarkable. Patient with pain in right hip probably secondary to ligament strain will treat patient with Vicodin and follow-up with PCP   The chart was scribed for me under my direct supervision.  I personally performed the history, physical, and medical decision making and all procedures in the evaluation of this patient.[name] Ferguson, MD 05/12/15 [phone]

## 2015-05-18 DIAGNOSIS — M25551 Pain in right hip: Secondary | ICD-10-CM | POA: Diagnosis not present

## 2015-05-18 DIAGNOSIS — M542 Cervicalgia: Secondary | ICD-10-CM | POA: Diagnosis not present

## 2015-05-18 DIAGNOSIS — I129 Hypertensive chronic kidney disease with stage 1 through stage 4 chronic kidney disease, or unspecified chronic kidney disease: Secondary | ICD-10-CM | POA: Diagnosis not present

## 2015-05-18 DIAGNOSIS — E1122 Type 2 diabetes mellitus with diabetic chronic kidney disease: Secondary | ICD-10-CM | POA: Diagnosis not present

## 2015-06-01 ENCOUNTER — Ambulatory Visit: Payer: Medicare Other | Admitting: Orthopaedic Surgery

## 2015-06-15 ENCOUNTER — Encounter: Payer: Self-pay | Admitting: Orthopedic Surgery

## 2015-06-15 ENCOUNTER — Ambulatory Visit (INDEPENDENT_AMBULATORY_CARE_PROVIDER_SITE_OTHER): Payer: Medicare Other | Admitting: Orthopedic Surgery

## 2015-06-15 ENCOUNTER — Ambulatory Visit (INDEPENDENT_AMBULATORY_CARE_PROVIDER_SITE_OTHER): Payer: Medicare Other

## 2015-06-15 VITALS — BP 158/75 | Ht 60.0 in | Wt 144.0 lb

## 2015-06-15 DIAGNOSIS — M545 Low back pain, unspecified: Secondary | ICD-10-CM

## 2015-06-15 DIAGNOSIS — M5136 Other intervertebral disc degeneration, lumbar region: Secondary | ICD-10-CM | POA: Diagnosis not present

## 2015-06-15 MED ORDER — DICLOFENAC POTASSIUM 50 MG PO TABS: 50.0000 mg | ORAL_TABLET | Freq: Two times a day (BID) | ORAL | Status: DC

## 2015-06-15 NOTE — Patient Instructions (Addendum)
Call [facility] therapy dept to schedule therapy visits  New medication sent to your pharmacy  Back Pain, Adult Back pain is very common in adults.The cause of back pain is rarely dangerous and the pain often gets better over time.The cause of your back pain may not be known. Some common causes of back pain include:  Strain of the muscles or ligaments supporting the spine.  Wear and tear (degeneration) of the spinal disks.  Arthritis.  Direct injury to the back. For many people, back pain may return. Since back pain is rarely dangerous, most people can learn to manage this condition on their own. HOME CARE INSTRUCTIONS Watch your back pain for any changes. The following actions may help to lessen any discomfort you are feeling:  Remain active. It is stressful on your back to sit or stand in one place for long periods of time. Do not sit, drive, or stand in one place for more than 30 minutes at a time. Take short walks on even surfaces as soon as you are able.Try to increase the length of time you walk each day.  Exercise regularly as directed by your health care provider. Exercise helps your back heal faster. It also helps avoid future injury by keeping your muscles strong and flexible.  Do not stay in bed.Resting more than 1-2 days can delay your recovery.  Pay attention to your body when you bend and lift. The most comfortable positions are those that put less stress on your recovering back. Always use proper lifting techniques, including:  Bending your knees.  Keeping the load close to your body.  Avoiding twisting.  Find a comfortable position to sleep. Use a firm mattress and lie on your side with your knees slightly bent. If you lie on your back, put a pillow under your knees.  Avoid feeling anxious or stressed.Stress increases muscle tension and can worsen back pain.It is important to recognize when you are anxious or stressed and learn ways to manage it, such as with  exercise.  Take medicines only as directed by your health care provider. Over-the-counter medicines to reduce pain and inflammation are often the most helpful.Your health care provider may prescribe muscle relaxant drugs.These medicines help dull your pain so you can more quickly return to your normal activities and healthy exercise.  Apply ice to the injured area:  Put ice in a plastic bag.  Place a towel between your skin and the bag.  Leave the ice on for 20 minutes, 2-3 times a day for the first 2-3 days. After that, ice and heat may be alternated to reduce pain and spasms.  Maintain a healthy weight. Excess weight puts extra stress on your back and makes it difficult to maintain good posture. SEEK MEDICAL CARE IF:  You have pain that is not relieved with rest or medicine.  You have increasing pain going down into the legs or buttocks.  You have pain that does not improve in one week.  You have night pain.  You lose weight.  You have a fever or chills. SEEK IMMEDIATE MEDICAL CARE IF:   You develop new bowel or bladder control problems.  You have unusual weakness or numbness in your arms or legs.  You develop nausea or vomiting.  You develop abdominal pain.  You feel faint.   This information is not intended to replace advice given to you by your health care provider. Make sure you discuss any questions you have with your health care provider.  Document Released: 03/27/2005 Document Revised: 04/17/2014 Document Reviewed: 07/29/2013 Elsevier Interactive Patient Education [employer].

## 2015-06-15 NOTE — Progress Notes (Signed)
Patient ID: [name] [name], female   DOB: 1940/06/13, 75 y.o.   MRN: [id number]  Chief Complaint  Patient presents with  . Back Pain    er follow up right hip pain, xray hip 05/12/15- BACK PAIN    HPI [name] [name] is a 75 y.o. female.  Presents for evaluation of right hip pain. She had x-rays in February they were negative.  She actually is pain in the lower back without radicular symptoms. She was initially treated with baclofen and hydrocodone. She has improved since the initial onset of symptoms a month ago with improved ability to get up Out of bed etc. she was having significant pain where she needed help to get up out of the bed  Pain again is in the right hip as a dull ache it's initially was severe now as moderate in a month's duration it was constant at first now it's more intermittent. Associated symptoms of bowel or bladder dysfunction are negative  Review of Systems Review of Systems No constitutional symptoms of fever chills fatigue or weight loss. No neurologic symptoms   Past Medical History  Diagnosis Date  . H/O: CVA (cerebrovascular accident) 2002  . Pulmonary HTN ([place])     mild by echo  . Diabetes mellitus ([place])   . Hyperlipidemia   . HTN (hypertension)     Past Surgical History  Procedure Laterality Date  . Cardiac catheterization  03/17/2003    Normal, patent coronary arteries. Normal LV systolic function  . Cardiovascular stress test  08/28/2006    Small sized, moderate ischemiain the Basal Inferoseptal, Basal Inferior, Mid Inferior and Apical Inferior regions  . Transthoracic echocardiogram  02/17/2010    EF >55%, moderate-severely thickened septum, mild-moderate mitral annular calcification  . Abdominal hysterectomy      No family history on file.  Social History Social History  Substance Use Topics  . Smoking status: Never Smoker   . Smokeless tobacco: Never Used  . Alcohol Use: No    Allergies  Allergen Reactions  . Brimonidine Other  (See Comments)    Eyes swell and itch  . Timolol Other (See Comments)    Caused eyes to swell and itch    Current Outpatient Prescriptions  Medication Sig Dispense Refill  . ACCU-CHEK AVIVA PLUS test strip   1  . ALPHAGAN P 0.1 % SOLN     . amLODipine (NORVASC) 10 MG tablet Take 10 mg by mouth daily.    Marland Kitchen aspirin 325 MG EC tablet Take 325 mg by mouth daily.    . B-D UF III MINI PEN NEEDLES 31G X 5 MM MISC   1  . FLUZONE HIGH-DOSE injection     . HYDROcodone-acetaminophen (NORCO/VICODIN) 5-325 MG tablet Take 1 tablet by mouth every 6 (six) hours as needed. 20 tablet 0  . hydrOXYzine (ATARAX/VISTARIL) 25 MG tablet Take 25 mg by mouth as needed for itching.    . insulin detemir (LEVEMIR) 100 UNIT/ML injection Inject 15 Units into the skin daily.    Marland Kitchen latanoprost (XALATAN) 0.005 % ophthalmic solution   1  . LEVEMIR FLEXTOUCH 100 UNIT/ML Pen     . magnesium oxide (MAG-OX) 400 MG tablet Take 400 mg by mouth daily.    . metFORMIN (GLUCOPHAGE) 1000 MG tablet Take 1,000 mg by mouth 2 (two) times daily with a meal.    . metoprolol succinate (TOPROL XL) 25 MG 24 hr tablet Take 1 tablet (25 mg total) by mouth daily. NEED APPOINTMENT FOR FUTURE  REFILLS. 30 tablet 0  . Multiple Vitamin (MULTIVITAMIN) tablet Take 1 tablet by mouth daily.    . pantoprazole (PROTONIX) 40 MG tablet Take 40 mg by mouth daily.    . potassium chloride SA (K-DUR,KLOR-CON) 20 MEQ tablet Take 1 tablet by mouth daily.    . rosuvastatin (CRESTOR) 10 MG tablet Take 10 mg by mouth daily.    Marland Kitchen TIMOLOL MALEATE OP Place 1 drop into both eyes daily.    . valsartan-hydrochlorothiazide (DIOVAN-HCT) 320-25 MG per tablet Take 1 tablet by mouth daily.    . diclofenac (CATAFLAM) 50 MG tablet Take 1 tablet (50 mg total) by mouth 2 (two) times daily. 60 tablet 0   No current facility-administered medications for this visit.       Physical Exam Physical Exam Blood pressure 158/75, height 5' (1.524 m), weight 144 lb (65.318  kg). Appearance, there are no abnormalities in terms of appearance the patient was well-developed and well-nourished. The grooming and hygiene were normal.  Mental status orientation, there was normal alertness and orientation Mood pleasant Ambulatory status normal with no assistive devices  Examination of the lumbar spine reveal tenderness at the L4-5 disc space and also on the  right gluteal area  and lower back.  Decreased spinal flexion extension and rotation were noted.  The lower extremities exhibited normal dorsiflexion plantar flexion extension flexion of the knee as well as hip flexion. No atrophy was noted. The skin overlying the lumbar thoracic and cervical regions was warm dry and intact without laceration. The lower extremities also had normal skin.  Neurologic examination  Reflexes were 1+ and equal  Sensation was normal in both feet and legs  Babinski's tests were down going  Straight leg raise testing was normal bilaterally  The vascular examination revealed normal dorsalis pedis pulses in both feet and both feet were warm with good capillary refill   Data Reviewed Imaging of the hip includes the pelvis and AP lateral right hip from the hospital with the report. I interpreted that as a normal hip with no arthritic changes.  Lumbar spine films done in the office mild degenerative disc disease with joint space narrowing  Assessment  Lower back pain without radicular symptoms   Plan  Patient can go on an anti-inflammatory for her lumbar spine degenerative disc disease arthritis  Physical therapy times a month  Follow up in a month  Meds ordered this encounter  Medications  . diclofenac (CATAFLAM) 50 MG tablet    Sig: Take 1 tablet (50 mg total) by mouth 2 (two) times daily.    Dispense:  60 tablet    Refill:  0

## 2015-06-29 ENCOUNTER — Ambulatory Visit (HOSPITAL_COMMUNITY): Payer: Medicare Other | Attending: Orthopedic Surgery | Admitting: Physical Therapy

## 2015-06-29 DIAGNOSIS — R2681 Unsteadiness on feet: Secondary | ICD-10-CM | POA: Diagnosis not present

## 2015-06-29 DIAGNOSIS — R262 Difficulty in walking, not elsewhere classified: Secondary | ICD-10-CM | POA: Diagnosis not present

## 2015-06-29 DIAGNOSIS — R293 Abnormal posture: Secondary | ICD-10-CM | POA: Diagnosis not present

## 2015-06-29 DIAGNOSIS — R6889 Other general symptoms and signs: Secondary | ICD-10-CM | POA: Insufficient documentation

## 2015-06-29 DIAGNOSIS — M2560 Stiffness of unspecified joint, not elsewhere classified: Secondary | ICD-10-CM | POA: Diagnosis not present

## 2015-06-29 DIAGNOSIS — M545 Low back pain, unspecified: Secondary | ICD-10-CM

## 2015-06-29 DIAGNOSIS — M6281 Muscle weakness (generalized): Secondary | ICD-10-CM | POA: Diagnosis not present

## 2015-06-29 NOTE — Therapy (Signed)
[place], [place], [address] Phone: [phone]   Fax:  [phone]  Physical Therapy Evaluation  Patient Details  Name: [name] [name] MRN: [id number] Date of Birth: 04-04-1941 Referring Provider: Arther Abbott   Encounter Date: 06/29/2015      PT End of Session - 06/29/15 1208    Visit Number 1   Number of Visits 12   Date for PT Re-Evaluation 07/20/15   Authorization Type UHC Medicare [employer] HMO    Authorization Time Period 06/29/15 to 08/10/15   Authorization - Visit Number 1   Authorization - Number of Visits 10   PT Start Time [id number]   PT Stop Time 0928   PT Time Calculation (min) 41 min   Activity Tolerance Patient tolerated treatment well   Behavior During Therapy [facility] for tasks assessed/performed      Past Medical History  Diagnosis Date  . H/O: CVA (cerebrovascular accident) 2002  . Pulmonary HTN ([place])     mild by echo  . Diabetes mellitus ([place])   . Hyperlipidemia   . HTN (hypertension)     Past Surgical History  Procedure Laterality Date  . Cardiac catheterization  03/17/2003    Normal, patent coronary arteries. Normal LV systolic function  . Cardiovascular stress test  08/28/2006    Small sized, moderate ischemiain the Basal Inferoseptal, Basal Inferior, Mid Inferior and Apical Inferior regions  . Transthoracic echocardiogram  02/17/2010    EF >55%, moderate-severely thickened septum, mild-moderate mitral annular calcification  . Abdominal hysterectomy      There were no vitals filed for this visit.  Visit Diagnosis:  Right-sided low back pain without sciatica - Plan: PT plan of care cert/re-cert  Poor posture - Plan: PT plan of care cert/re-cert  Generalized stiffness - Plan: PT plan of care cert/re-cert  Muscle weakness - Plan: PT plan of care cert/re-cert  Difficulty walking - Plan: PT plan of care cert/re-cert  Unsteadiness - Plan: PT plan of care cert/re-cert  Decreased  functional activity tolerance - Plan: PT plan of care cert/re-cert      Subjective Assessment - 06/29/15 0851    Subjective Patient reports that her back started hurting her about 4 weeks ago, started as just a little pain that just kept getting worse; she has seen doctors and even went to the hospital at one point, just kept getting medications and shots, none of which really helped her. She was referred to Dr. Aline Brochure after her visit to the hospital for her back pain.    Pertinent History L ventricular diastolic dysfunction, Diabetes, history of stroke    How long can you sit comfortably? no limitations    How long can you stand comfortably? can stand to cook/wash dishes, not sure of exact time    How long can you walk comfortably? 10 minutes    Diagnostic tests x-ray    Patient Stated Goals to get better    Currently in Pain? No/denies  right now 0/10; at worst 6/10            [facility] PT Assessment - 06/29/15 0001    Assessment   Medical Diagnosis R sided LBP without sciatica    Referring Provider Arther Abbott    Onset Date/Surgical Date 06/01/15   Next MD Visit April 18th with Dr. Aline Brochure    Precautions   Precautions None   Restrictions   Weight Bearing Restrictions No   Balance Screen   Has the patient  fallen in the past 6 months No   Has the patient had a decrease in activity level because of a fear of falling?  No   Is the patient reluctant to leave their home because of a fear of falling?  No   Prior Function   Level of Independence Independent;Independent with basic ADLs;Independent with gait;Independent with transfers   Vocation Retired   Leisure going to senior center   Observation/Other Assessments   Observations no LLD noted today in supine    Focus on Therapeutic Outcomes (FOTO)  50% limited    Posture/Postural Control   Posture Comments reduced spinal curves, forward head, some general muscle atrophy, flexed at hips    AROM   Overall AROM Comments  moderate hip stiffness bilaterally; FABER negative B   Lumbar Flexion 72   Lumbar Extension 9   Lumbar - Right Side Bend approximately midline of knee joint    Lumbar - Left Side Bend approximately 0.7 inches more limited than R    Strength   Right Hip Flexion 3/5   Right Hip Extension 2+/5   Right Hip ABduction 3+/5   Left Hip Flexion 3/5   Left Hip Extension 3-/5   Left Hip ABduction 3/5   Right Knee Flexion 4-/5   Right Knee Extension 3+/5   Left Knee Flexion 4/5   Left Knee Extension 4-/5   Right Ankle Dorsiflexion 4-/5   Left Ankle Dorsiflexion 4/5   Flexibility   Hamstrings mild limitation    Piriformis mild-moderate limitation, R greater than L    Ambulation/Gait   Gait Comments reduced gait speed, reduced rotation of trunk and pelvis, some foot slap, reduced heel-toe pushoff   6 minute walk test results    Aerobic Endurance Distance Walked 378   Endurance additional comments 3MWT    Timed Up and Go Test   Normal TUG (seconds) 14.7                           PT Education - 06/29/15 1207    Education provided Yes   Education Details prognosis, plan of care, HEP    Person(s) Educated Patient   Methods Explanation;Demonstration;Handout   Comprehension Verbalized understanding;Returned demonstration;Need further instruction          PT Short Term Goals - 06/29/15 1212    PT SHORT TERM GOAL #1   Title Patient will demosntrate improvements in lumbar flexion and extension by 10 degrees, equal lateral lumbar flexion, and only minimal hip stiffness and limitation bilaterally in order to reduce pain and improve overall mechanics    Time 3   Period Weeks   Status New   PT SHORT TERM GOAL #2   Title Patient to be able to perform TUG test in 10 seconds or less in order to demonstrate improved mobiltiy and reduced fall risk    Time 3   Period Weeks   Status New   PT SHORT TERM GOAL #3   Title Patient to report pain no more than 2/10 during all  functional situations and in all positions in order to improve function and overall quality of life    Time 3   Period Weeks   Status New   PT SHORT TERM GOAL #4   Title Patient to consistently and correctly perform appropriate HEP, to be updated PRN    Time 3   Period Weeks   Status New  PT Long Term Goals - 07-11-2015 1215    PT LONG TERM GOAL #1   Title Patient to demonstrate strength at least 4/5 in all tested muscle groups in order to assist in reducing pain, improving posture, and improving overall function    Time 6   Period Weeks   Status New   PT LONG TERM GOAL #2   Title Patient to be able to ambulate at least 616ft during 3MWT, no rest breaks and minimal fatigue, in order to demonstrate improved ability to mobilize within community    Time 6   Period Weeks   Status New   PT LONG TERM GOAL #3   Title Patient to be performing regular exercise of moderate intensity, at least 20 minutes in duration, at least 3 times per week, in order to maintain functional gains and promote overall improved health status    Time 6   Period Weeks   Status New   PT LONG TERM GOAL #4   Title Patient to report that she has been able to return to regular activities at the senior center in order to assist in returning to [facility] and improved QOL   Time 6   Period Weeks   Status New               Plan - 07/11/2015 [place] arrives complaining of R sided low back pain that she reports has been around for a little over a month; she reports it is actually getting better compared to what it was like in February but is still bothering her a bit with worst pain being around 6/10 right now. Upon exmaination, patient displays signficant muscle weakness, postural and gait deviations, general stiffness, reduced functional activity tolerance, unsteadiness, and reduced functional task performance overall. At this time she will benefit from skilled PT services  in order to address functional limitations and to to assist in reaching optimal level of function.    Pt will benefit from skilled therapeutic intervention in order to improve on the following deficits Abnormal gait;Hypomobility;Decreased activity tolerance;Decreased strength;Pain;Difficulty walking;Decreased balance;Improper body mechanics;Decreased coordination;Impaired flexibility;Postural dysfunction   Rehab Potential Good   PT Frequency 2x / week   PT Duration 6 weeks   PT Treatment/Interventions ADLs/Self Care Home Management;Cryotherapy;Moist Heat;Gait training;Stair training;Functional mobility training;Therapeutic activities;Therapeutic exercise;Balance training;Neuromuscular re-education;Patient/family education;Manual techniques;Passive range of motion;Energy conservation;Taping   PT Next Visit Plan review HEP and goals; functional stretching, postural training, spine/hip mobility, functional strength    PT Home Exercise Plan given    Consulted and Agree with Plan of Care Patient          G-Codes - 07/11/2015 1222    Functional Assessment Tool Used FOTO 50% limited    Functional Limitation Mobility: Walking and moving around   Mobility: Walking and Moving Around Current Status [phone]) At least 40 percent but less than 60 percent impaired, limited or restricted   Mobility: Walking and Moving Around Goal Status [phone]) At least 20 percent but less than 40 percent impaired, limited or restricted       Problem List Patient Active Problem List   Diagnosis Date Noted  . Left ventricular diastolic dysfunction, NYHA class 1 02/06/2013  . CVA, old, hemiparesis ([place]) 02/06/2013  . Hyperlipidemia   . HTN (hypertension)   . Diabetes mellitus [facility])     Deniece Ree PT, DPT [place] [address] [place], [place], [address] Phone: [phone]  Fax:  [phone]  Name: [name] [name] MRN: [id number] Date of Birth:  27-Feb-1941

## 2015-06-29 NOTE — Patient Instructions (Signed)
   BRIDGING  While lying on your back, tighten your lower abdominals, squeeze your buttocks and then raise your buttocks off the floor/bed as creating a "Bridge" with your body.  Repeat 10 times, 2-3 times per day.     HIP ABDUCTION - SIDELYING  While lying on your side, slowly raise up your top leg to the side. Keep your knee straight and maintain your toes pointed forward the entire time.   The bottom leg can be bent to stabilize your body.  Repeat 10 times each side, twice a day.    PIRIFORMIS AND HIP STRETCH - SEATED  While sitting in a chair, cross your affected leg on top of the other as shown.   Next, gently lean forward until a stretch is felt along the crossed leg.  Hold for 30 seconds, and then repeat on your other side. Repeat 2-3 times each leg, twice a day.      Transverse Abdominus Activation  Lying on your back, pull your bellybutton into your spine. Hold for 2 seconds and relax. It should feel like you are sucking your belly button into your spine.  Repeat 20 times, 3-5 times per day.

## 2015-07-02 ENCOUNTER — Ambulatory Visit (HOSPITAL_COMMUNITY): Payer: Medicare Other

## 2015-07-02 DIAGNOSIS — R262 Difficulty in walking, not elsewhere classified: Secondary | ICD-10-CM

## 2015-07-02 DIAGNOSIS — R2681 Unsteadiness on feet: Secondary | ICD-10-CM

## 2015-07-02 DIAGNOSIS — M6281 Muscle weakness (generalized): Secondary | ICD-10-CM | POA: Diagnosis not present

## 2015-07-02 DIAGNOSIS — R6889 Other general symptoms and signs: Secondary | ICD-10-CM | POA: Diagnosis not present

## 2015-07-02 DIAGNOSIS — M545 Low back pain, unspecified: Secondary | ICD-10-CM

## 2015-07-02 DIAGNOSIS — M2560 Stiffness of unspecified joint, not elsewhere classified: Secondary | ICD-10-CM | POA: Diagnosis not present

## 2015-07-02 DIAGNOSIS — R293 Abnormal posture: Secondary | ICD-10-CM

## 2015-07-02 NOTE — Therapy (Signed)
[facility] [address] [place], [place], [address] Phone: [phone]   Fax:  [phone]  Physical Therapy Treatment  Patient Details  Name: WADIYA PHANN MRN: [id number] Date of Birth: 1940/10/20 Referring Provider: Arther Abbott   Encounter Date: 07/02/2015      PT End of Session - 07/02/15 0857    Visit Number 2   Number of Visits 12   Date for PT Re-Evaluation 07/20/15   Authorization Type UHC Medicare AARP HMO    Authorization Time Period 06/29/15 to 08/10/15   Authorization - Visit Number 2   Authorization - Number of Visits 10   PT Start Time [phone]   PT Stop Time 0930   PT Time Calculation (min) 38 min   Activity Tolerance Patient tolerated treatment well   Behavior During Therapy [facility] for tasks assessed/performed      Past Medical History  Diagnosis Date  . H/O: CVA (cerebrovascular accident) 2002  . Pulmonary HTN ([place])     mild by echo  . Diabetes mellitus ([place])   . Hyperlipidemia   . HTN (hypertension)     Past Surgical History  Procedure Laterality Date  . Cardiac catheterization  03/17/2003    Normal, patent coronary arteries. Normal LV systolic function  . Cardiovascular stress test  08/28/2006    Small sized, moderate ischemiain the Basal Inferoseptal, Basal Inferior, Mid Inferior and Apical Inferior regions  . Transthoracic echocardiogram  02/17/2010    EF >55%, moderate-severely thickened septum, mild-moderate mitral annular calcification  . Abdominal hysterectomy      There were no vitals filed for this visit.  Visit Diagnosis:  Right-sided low back pain without sciatica  Poor posture  Generalized stiffness  Muscle weakness  Difficulty walking  Unsteadiness  Decreased functional activity tolerance      Subjective Assessment - 07/02/15 0843    Subjective Pt stated she is feeling good today.  Reports complaince with HEP.     Pertinent History L ventricular diastolic dysfunction,  Diabetes, history of stroke    Patient Stated Goals to get better    Currently in Pain? No/denies            [facility] Adult PT Treatment/Exercise - 07/02/15 0001    Exercises   Exercises Lumbar   Lumbar Exercises: Stretches   Single Knee to Chest Stretch 3 reps;20 seconds   Lower Trunk Rotation 5 reps;10 seconds   Lumbar Exercises: Standing   Heel Raises 10 reps   Heel Raises Limitations Toe raises 10x   Functional Squats 10 reps   Functional Squats Limitations 3D hip excursion   Lumbar Exercises: Supine   Ab Set 10 reps;5 seconds   Bent Knee Raise 10 reps;3 seconds   Bridge 10 reps   Straight Leg Raise 10 reps   Lumbar Exercises: Sidelying   Hip Abduction 10 reps             PT Short Term Goals - 06/29/15 1212    PT SHORT TERM GOAL #1   Title Patient will demosntrate improvements in lumbar flexion and extension by 10 degrees, equal lateral lumbar flexion, and only minimal hip stiffness and limitation bilaterally in order to reduce pain and improve overall mechanics    Time 3   Period Weeks   Status New   PT SHORT TERM GOAL #2   Title Patient to be able to perform TUG test in 10 seconds or less in order to demonstrate improved mobiltiy and reduced fall risk  Time 3   Period Weeks   Status New   PT SHORT TERM GOAL #3   Title Patient to report pain no more than 2/10 during all functional situations and in all positions in order to improve function and overall quality of life    Time 3   Period Weeks   Status New   PT SHORT TERM GOAL #4   Title Patient to consistently and correctly perform appropriate HEP, to be updated PRN    Time 3   Period Weeks   Status New           PT Long Term Goals - 06/29/15 1215    PT LONG TERM GOAL #1   Title Patient to demonstrate strength at least 4/5 in all tested muscle groups in order to assist in reducing pain, improving posture, and improving overall function    Time 6   Period Weeks   Status New   PT LONG TERM GOAL #2    Title Patient to be able to ambulate at least 664ft during 3MWT, no rest breaks and minimal fatigue, in order to demonstrate improved ability to mobilize within community    Time 6   Period Weeks   Status New   PT LONG TERM GOAL #3   Title Patient to be performing regular exercise of moderate intensity, at least 20 minutes in duration, at least 3 times per week, in order to maintain functional gains and promote overall improved health status    Time 6   Period Weeks   Status New   PT LONG TERM GOAL #4   Title Patient to report that she has been able to return to regular activities at the senior center in order to assist in returning to PLOF and improved QOL   Time 6   Period Weeks   Status New               Plan - 07/02/15 [id number]    Clinical Impression Statement Reviewed goals, compliance and assured correct form with HEP and copy of evaluation given to pt.  Session focus on improving lumbar mobility as well as core and proximal musculature strengthening.  Pt required minimal verbal and tactile cueing for proper form with majority of exercises.  No reports of increased pain through session.   Pt will benefit from skilled therapeutic intervention in order to improve on the following deficits Abnormal gait;Hypomobility;Decreased activity tolerance;Decreased strength;Pain;Difficulty walking;Decreased balance;Improper body mechanics;Decreased coordination;Impaired flexibility;Postural dysfunction   Rehab Potential Good   PT Frequency 2x / week   PT Duration 6 weeks   PT Treatment/Interventions ADLs/Self Care Home Management;Cryotherapy;Moist Heat;Gait training;Stair training;Functional mobility training;Therapeutic activities;Therapeutic exercise;Balance training;Neuromuscular re-education;Patient/family education;Manual techniques;Passive range of motion;Energy conservation;Taping   PT Next Visit Plan Next session progress core and proximal musculature strengthening, spine/hip mobility,  postural training, and functional strengthening.  Next session progress standing exercises with lunges and theraband postural strengthening, continue sidelying for proper form with abduction.        Problem List Patient Active Problem List   Diagnosis Date Noted  . Left ventricular diastolic dysfunction, NYHA class 1 02/06/2013  . CVA, old, hemiparesis ([place]) 02/06/2013  . Hyperlipidemia   . HTN (hypertension)   . Diabetes mellitus [facility])    [place], [place]; [place] [phone]'  Aldona Lento 07/02/2015, 9:55 AM  [facility] [address] [place], [place], [address] Phone: [phone]   Fax:  [phone]  Name: JASMOND REINDERS MRN: [id number] Date  of Birth: [birth year]

## 2015-07-06 ENCOUNTER — Encounter (HOSPITAL_COMMUNITY): Payer: Medicare Other

## 2015-07-06 ENCOUNTER — Telehealth (HOSPITAL_COMMUNITY): Payer: Self-pay

## 2015-07-06 NOTE — Telephone Encounter (Signed)
No show, called and spoke to pt who stated the Arch was falling in her foot and increased pain and edema, applying ice on foot.  Pt stated she had previously called today and cancelled apt.  (Difficulty with phone lines at office today).  [address], [place]; [place] [phone]

## 2015-07-07 ENCOUNTER — Telehealth: Payer: Self-pay | Admitting: Podiatry

## 2015-07-07 NOTE — Telephone Encounter (Signed)
Instructed D. Miner to get pt in for an earlier appt.

## 2015-07-07 NOTE — Telephone Encounter (Signed)
Pt called and on left foot her arch is swollen and painful she can hardly walk.Pt is a diabetic. Should she get an appt sooner or is there something she can do until her appt on 4.7.17

## 2015-07-08 ENCOUNTER — Ambulatory Visit (INDEPENDENT_AMBULATORY_CARE_PROVIDER_SITE_OTHER): Payer: Medicare Other | Admitting: Podiatry

## 2015-07-08 ENCOUNTER — Encounter: Payer: Self-pay | Admitting: Podiatry

## 2015-07-08 ENCOUNTER — Encounter (HOSPITAL_COMMUNITY): Payer: Medicare Other | Admitting: Physical Therapy

## 2015-07-08 ENCOUNTER — Ambulatory Visit (INDEPENDENT_AMBULATORY_CARE_PROVIDER_SITE_OTHER): Payer: Medicare Other

## 2015-07-08 VITALS — BP 161/89 | HR 87 | Resp 16

## 2015-07-08 VITALS — BP 147/75 | HR 84 | Resp 14

## 2015-07-08 DIAGNOSIS — M1000 Idiopathic gout, unspecified site: Secondary | ICD-10-CM

## 2015-07-08 DIAGNOSIS — M6588 Other synovitis and tenosynovitis, other site: Secondary | ICD-10-CM | POA: Diagnosis not present

## 2015-07-08 DIAGNOSIS — M79672 Pain in left foot: Secondary | ICD-10-CM

## 2015-07-08 DIAGNOSIS — M7750 Other enthesopathy of unspecified foot: Secondary | ICD-10-CM

## 2015-07-08 MED ORDER — TRIAMCINOLONE ACETONIDE 10 MG/ML IJ SUSP
10.0000 mg | Freq: Once | INTRAMUSCULAR | Status: AC
  Administered 2015-07-08: 10 mg

## 2015-07-08 NOTE — Patient Instructions (Signed)

## 2015-07-08 NOTE — Progress Notes (Signed)
Subjective:     Patient ID: [name] [name], female   DOB: 09-02-1940, 75 y.o.   MRN: [id number]  HPI this patient presents the office with chief complaint of a red, inflamed area over her left arch. She admits to having been exercising and pulling on her left foot. This exercise was last Friday and it has developed into a red, swollen, inflamed area on the inside of her left foot. She states that the pain is causing her to have difficulty walking for which she has obtained a walker. She has provided no self treatment nor sought any professional help. She presents the office today for an evaluation and treatment of this condition Review of Systems     Objective:   Physical Exam neurovascular status is the same as the previous visit. She does have a red, inflamed, swollen area along the course of the posterior tibial tendon, left foot and its insertion     Assessment:     Tendinitis left foot     Plan:     ROV  After examination of her foot I decided she needed to have x-rays taken to evaluate foot for bone pathology.  She was sent to [place] office for [employer] and treatment.   Gardiner Barefoot DPM

## 2015-07-09 ENCOUNTER — Ambulatory Visit
Admission: RE | Admit: 2015-07-09 | Discharge: 2015-07-09 | Disposition: A | Discharge status: Home / Self Care | Payer: Medicare Other | Source: Ambulatory Visit

## 2015-07-09 ENCOUNTER — Other Ambulatory Visit: Payer: Self-pay

## 2015-07-09 DIAGNOSIS — Z1231 Encounter for screening mammogram for malignant neoplasm of breast: Secondary | ICD-10-CM

## 2015-07-09 NOTE — Progress Notes (Signed)
Subjective:     Patient ID: [name] [name], female   DOB: 27-Jul-1940, 75 y.o.   MRN: [id number]  HPI patient presents with quite a bit of discomfort in the medial ankle left of short-term duration with inflammation fluid buildup and redness noted with no drainage   Review of Systems     Objective:   Physical Exam Neurovascular status intact negative Homans sign noted with erythema around the medial ankle left with quite a bit of discomfort around the posterior tib and medial ankle group with fluid buildup but no indication currently of tendon dysfunction    Assessment:     Possibility for gout versus acute tendinitis left    Plan:     H&P and x-rays reviewed with patient. Spent a great deal of time reviewing this condition and at this time went ahead and did a careful sheath injection posterior tibial tendon 3 mg Kenalog 5 mg Xylocaine and then I went ahead and I educated her on gout and gave her sheet about the condition and foods to avoid. Patient will be seen back in the next several weeks or earlier if needed and may require blood work and treatment for gout if symptoms persist  Report indicates mild collapse medial longitudinal arch with no indications of stress fracture or arthritic condition

## 2015-07-13 ENCOUNTER — Telehealth (HOSPITAL_COMMUNITY): Payer: Self-pay

## 2015-07-13 ENCOUNTER — Encounter (HOSPITAL_COMMUNITY): Payer: Medicare Other

## 2015-07-13 NOTE — Telephone Encounter (Signed)
No show, called and spoke to pt. she reports Lt foot increased pain and swelling, has been to foot doctor last week.  Pt requested cancellation this Thursday.  Pt educated No show policy.  Reminded next apt date and time.    [address], [place]; CBIS [phone]

## 2015-07-14 DIAGNOSIS — I129 Hypertensive chronic kidney disease with stage 1 through stage 4 chronic kidney disease, or unspecified chronic kidney disease: Secondary | ICD-10-CM | POA: Diagnosis not present

## 2015-07-14 DIAGNOSIS — E1122 Type 2 diabetes mellitus with diabetic chronic kidney disease: Secondary | ICD-10-CM | POA: Diagnosis not present

## 2015-07-14 DIAGNOSIS — R202 Paresthesia of skin: Secondary | ICD-10-CM | POA: Diagnosis not present

## 2015-07-14 DIAGNOSIS — M25532 Pain in left wrist: Secondary | ICD-10-CM | POA: Diagnosis not present

## 2015-07-14 DIAGNOSIS — N182 Chronic kidney disease, stage 2 (mild): Secondary | ICD-10-CM | POA: Diagnosis not present

## 2015-07-14 DIAGNOSIS — M79672 Pain in left foot: Secondary | ICD-10-CM | POA: Diagnosis not present

## 2015-07-15 ENCOUNTER — Ambulatory Visit (HOSPITAL_COMMUNITY): Payer: Medicare Other | Attending: Orthopedic Surgery | Admitting: Physical Therapy

## 2015-07-16 ENCOUNTER — Ambulatory Visit: Payer: Medicare Other | Admitting: Podiatry

## 2015-07-20 ENCOUNTER — Ambulatory Visit (HOSPITAL_COMMUNITY): Payer: Medicare Other | Admitting: Physical Therapy

## 2015-07-22 ENCOUNTER — Ambulatory Visit (INDEPENDENT_AMBULATORY_CARE_PROVIDER_SITE_OTHER): Payer: Medicare Other | Admitting: Podiatry

## 2015-07-22 ENCOUNTER — Encounter: Payer: Self-pay | Admitting: Podiatry

## 2015-07-22 ENCOUNTER — Encounter (HOSPITAL_COMMUNITY): Payer: Medicare Other | Admitting: Physical Therapy

## 2015-07-22 DIAGNOSIS — M1000 Idiopathic gout, unspecified site: Secondary | ICD-10-CM

## 2015-07-22 DIAGNOSIS — M6588 Other synovitis and tenosynovitis, other site: Secondary | ICD-10-CM | POA: Diagnosis not present

## 2015-07-22 DIAGNOSIS — M7750 Other enthesopathy of unspecified foot: Secondary | ICD-10-CM

## 2015-07-25 NOTE — Progress Notes (Signed)
Subjective:     Patient ID: [name] [name], female   DOB: 03-07-1941, 75 y.o.   MRN: [id number]  HPI patient states the left foot is feeling quite a bit better with diminished discomfort in the first MPJ and continued reduction of discomfort around the medial ankle region   Review of Systems     Objective:   Physical Exam Neurovascular status intact with diminished edema in the left ankle around the tendinous complex posterior tib and diminished discomfort around the first MPJ with range of motion that's improved and muscle strength is adequate    Assessment:     Appears to be improved from probable gout episode with no results yet of uric acid levels    Plan:     Reviewed again uric acid and the importance of continued reduced activity and supportive shoe gear and compression. Patient will be seen back again if a flareup were to occur

## 2015-07-27 ENCOUNTER — Ambulatory Visit (INDEPENDENT_AMBULATORY_CARE_PROVIDER_SITE_OTHER): Payer: Medicare Other | Admitting: Orthopedic Surgery

## 2015-07-27 ENCOUNTER — Telehealth (HOSPITAL_COMMUNITY): Payer: Self-pay

## 2015-07-27 ENCOUNTER — Encounter (HOSPITAL_COMMUNITY): Payer: Medicare Other | Admitting: Physical Therapy

## 2015-07-27 VITALS — BP 178/77 | HR 70 | Ht 60.0 in | Wt 144.0 lb

## 2015-07-27 DIAGNOSIS — M5136 Other intervertebral disc degeneration, lumbar region: Secondary | ICD-10-CM

## 2015-07-27 DIAGNOSIS — M51369 Other intervertebral disc degeneration, lumbar region without mention of lumbar back pain or lower extremity pain: Secondary | ICD-10-CM

## 2015-07-27 NOTE — Telephone Encounter (Signed)
07/27/15 said she was doing good and didn't think she would be coming back.

## 2015-07-27 NOTE — Progress Notes (Signed)
Follow-up visit  Patient was diagnosed with lower back pain without radicular symptoms. She was treated with 50 mg diclofenac and physical therapy  She reports no symptoms at present she stopped taking the medication she's doing well  She will be discharged

## 2015-07-29 ENCOUNTER — Encounter (HOSPITAL_COMMUNITY): Payer: Medicare Other

## 2015-08-03 ENCOUNTER — Encounter (HOSPITAL_COMMUNITY): Payer: Medicare Other | Admitting: Physical Therapy

## 2015-08-05 ENCOUNTER — Ambulatory Visit (HOSPITAL_COMMUNITY): Payer: Medicare Other | Admitting: Physical Therapy

## 2015-08-11 DIAGNOSIS — Z794 Long term (current) use of insulin: Secondary | ICD-10-CM | POA: Diagnosis not present

## 2015-08-11 DIAGNOSIS — N08 Glomerular disorders in diseases classified elsewhere: Secondary | ICD-10-CM | POA: Diagnosis not present

## 2015-08-11 DIAGNOSIS — N182 Chronic kidney disease, stage 2 (mild): Secondary | ICD-10-CM | POA: Diagnosis not present

## 2015-08-11 DIAGNOSIS — E1122 Type 2 diabetes mellitus with diabetic chronic kidney disease: Secondary | ICD-10-CM | POA: Diagnosis not present

## 2015-11-11 DIAGNOSIS — I129 Hypertensive chronic kidney disease with stage 1 through stage 4 chronic kidney disease, or unspecified chronic kidney disease: Secondary | ICD-10-CM | POA: Diagnosis not present

## 2015-11-11 DIAGNOSIS — N182 Chronic kidney disease, stage 2 (mild): Secondary | ICD-10-CM | POA: Diagnosis not present

## 2015-11-11 DIAGNOSIS — N08 Glomerular disorders in diseases classified elsewhere: Secondary | ICD-10-CM | POA: Diagnosis not present

## 2015-11-11 DIAGNOSIS — E1122 Type 2 diabetes mellitus with diabetic chronic kidney disease: Secondary | ICD-10-CM | POA: Diagnosis not present

## 2015-12-09 ENCOUNTER — Encounter (HOSPITAL_COMMUNITY): Payer: Self-pay | Admitting: Physical Therapy

## 2015-12-09 NOTE — Therapy (Signed)
[facility] [address] [place], [place], [address] Phone: [phone]   Fax:  [phone]  Patient Details  Name: [name] [name] MRN: [id number] Date of Birth: 1940-08-16 Referring Provider:  No ref. provider found  Encounter Date: 12/09/2015   PHYSICAL THERAPY DISCHARGE SUMMARY  Visits from Start of Care: 2  Current functional level related to goals / functional outcomes: Patient has not returned since last skilled session    Remaining deficits: Unable to assess    Education / Equipment: N/A  Plan: Patient agrees to discharge.  Patient goals were not met. Patient is being discharged due to not returning since the last visit.  ?????    Deniece Ree PT, DPT [facility] [facility] [address] [place], [place], [address] Phone: [phone]   Fax:  [phone]

## 2016-01-03 DIAGNOSIS — N182 Chronic kidney disease, stage 2 (mild): Secondary | ICD-10-CM | POA: Diagnosis not present

## 2016-01-03 DIAGNOSIS — I129 Hypertensive chronic kidney disease with stage 1 through stage 4 chronic kidney disease, or unspecified chronic kidney disease: Secondary | ICD-10-CM | POA: Diagnosis not present

## 2016-01-03 DIAGNOSIS — Z23 Encounter for immunization: Secondary | ICD-10-CM | POA: Diagnosis not present

## 2016-01-14 ENCOUNTER — Other Ambulatory Visit: Payer: Self-pay | Admitting: *Deleted

## 2016-01-14 NOTE — Patient Outreach (Signed)
[facility]) Care Management  01/14/2016  [name] [name] January 13, 1941 [id number]  Referral from MD office for medication management.  Telephone call to patient who was advised of reason for call and of [facility] care management services.  HIPPA verification received.   Patient states she has already seen a nurse at her home from [facility]. States she comes out once a year.   Patient voices no  concerns about managing her medications. States she " takes the medicines as directed."  Voices that her blood pressure is okay.  Voices that she has diabetes and that it is up & down and that last "A1c' was 10. Patient voices that she does not have any diabetic complications.   States she is checking her blood sugar and administers own insulin.  She voices that she has diabetes classes and is not going back anymore.    Patient does agree to [facility] care coordinator services.  Patient voices that she will be out of town next week and will return on Oct 18 or 19th.   Assessment; Diabetes in uncontrolled per MD progress notes- last Hbg A1c-10.2 at office visit. Patient on greater than 10 medication. MD referral states patient needs assistance with medication management. Patient is independent, driving self to MD appointments, managing ADL's & IADL's . Patient has knowledge deficit regarding managing chronic disease.   Plan:  Refer to care management assistant to assign to [facility] care coordinator for chronic disease management & medication management.   Sherrin Daisy, RN BSN [place] Management Coordinator [facility] Care Management  [phone]

## 2016-01-27 ENCOUNTER — Other Ambulatory Visit: Payer: Self-pay | Admitting: *Deleted

## 2016-01-27 NOTE — Patient Outreach (Signed)
Telephone call to pt to schedule home visit, spoke with pt, HIPAA verified, initial home visit scheduled for 02/01/16.  Jacqlyn Larsen [facility], [place] Coordinator [phone]

## 2016-02-01 ENCOUNTER — Ambulatory Visit: Payer: Self-pay | Admitting: *Deleted

## 2016-02-11 ENCOUNTER — Other Ambulatory Visit: Payer: Self-pay | Admitting: *Deleted

## 2016-02-11 ENCOUNTER — Encounter: Payer: Self-pay | Admitting: *Deleted

## 2016-02-11 NOTE — Patient Outreach (Signed)
02/11/16- RN CM called pt to remind her of today's initial home visit and pt states she is not home and not sure when she will be back, upon further questioning, pt hesitant to reschedule visit and then states she does not feel she needs [facility] program and would like to opt out.  Case closed, RN CM faxed letter to primary MD Glendale Chard, sent case closure letter to pt with RN CM/ [facility] contact information for future reference.  Jacqlyn Larsen [facility], [place] Coordinator [phone]

## 2016-02-22 DIAGNOSIS — E1122 Type 2 diabetes mellitus with diabetic chronic kidney disease: Secondary | ICD-10-CM | POA: Diagnosis not present

## 2016-02-22 DIAGNOSIS — N182 Chronic kidney disease, stage 2 (mild): Secondary | ICD-10-CM | POA: Diagnosis not present

## 2016-02-22 DIAGNOSIS — N08 Glomerular disorders in diseases classified elsewhere: Secondary | ICD-10-CM | POA: Diagnosis not present

## 2016-02-22 DIAGNOSIS — E11311 Type 2 diabetes mellitus with unspecified diabetic retinopathy with macular edema: Secondary | ICD-10-CM | POA: Diagnosis not present

## 2016-06-07 DIAGNOSIS — Z0000 Encounter for general adult medical examination without abnormal findings: Secondary | ICD-10-CM | POA: Diagnosis not present

## 2016-06-07 DIAGNOSIS — N182 Chronic kidney disease, stage 2 (mild): Secondary | ICD-10-CM | POA: Diagnosis not present

## 2016-06-07 DIAGNOSIS — E1122 Type 2 diabetes mellitus with diabetic chronic kidney disease: Secondary | ICD-10-CM | POA: Diagnosis not present

## 2016-06-07 DIAGNOSIS — N08 Glomerular disorders in diseases classified elsewhere: Secondary | ICD-10-CM | POA: Diagnosis not present

## 2016-06-07 DIAGNOSIS — I129 Hypertensive chronic kidney disease with stage 1 through stage 4 chronic kidney disease, or unspecified chronic kidney disease: Secondary | ICD-10-CM | POA: Diagnosis not present

## 2016-06-07 DIAGNOSIS — E559 Vitamin D deficiency, unspecified: Secondary | ICD-10-CM | POA: Diagnosis not present

## 2016-06-13 ENCOUNTER — Other Ambulatory Visit: Payer: Self-pay | Admitting: Internal Medicine

## 2016-06-13 DIAGNOSIS — Z1231 Encounter for screening mammogram for malignant neoplasm of breast: Secondary | ICD-10-CM

## 2016-07-10 ENCOUNTER — Ambulatory Visit: Payer: Medicare Other

## 2016-07-13 DIAGNOSIS — L718 Other rosacea: Secondary | ICD-10-CM | POA: Diagnosis not present

## 2016-07-13 DIAGNOSIS — L298 Other pruritus: Secondary | ICD-10-CM | POA: Diagnosis not present

## 2016-07-18 ENCOUNTER — Ambulatory Visit
Admission: RE | Admit: 2016-07-18 | Discharge: 2016-07-18 | Disposition: A | Discharge status: Home / Self Care | Payer: Medicare Other | Source: Ambulatory Visit | Attending: Internal Medicine | Admitting: Internal Medicine

## 2016-07-18 DIAGNOSIS — Z1231 Encounter for screening mammogram for malignant neoplasm of breast: Secondary | ICD-10-CM | POA: Diagnosis not present

## 2016-07-24 DIAGNOSIS — H2513 Age-related nuclear cataract, bilateral: Secondary | ICD-10-CM | POA: Diagnosis not present

## 2016-07-24 DIAGNOSIS — Z8669 Personal history of other diseases of the nervous system and sense organs: Secondary | ICD-10-CM | POA: Diagnosis not present

## 2016-07-24 DIAGNOSIS — H44512 Absolute glaucoma, left eye: Secondary | ICD-10-CM | POA: Diagnosis not present

## 2016-07-24 DIAGNOSIS — H401113 Primary open-angle glaucoma, right eye, severe stage: Secondary | ICD-10-CM | POA: Diagnosis not present

## 2016-08-24 DIAGNOSIS — H5440 Blindness, one eye, unspecified eye: Secondary | ICD-10-CM | POA: Insufficient documentation

## 2016-08-24 DIAGNOSIS — H25813 Combined forms of age-related cataract, bilateral: Secondary | ICD-10-CM | POA: Diagnosis not present

## 2016-08-24 DIAGNOSIS — H401133 Primary open-angle glaucoma, bilateral, severe stage: Secondary | ICD-10-CM | POA: Diagnosis not present

## 2016-08-24 DIAGNOSIS — E113311 Type 2 diabetes mellitus with moderate nonproliferative diabetic retinopathy with macular edema, right eye: Secondary | ICD-10-CM | POA: Diagnosis not present

## 2016-08-24 HISTORY — DX: Combined forms of age-related cataract, bilateral: H25.813

## 2016-08-29 DIAGNOSIS — N182 Chronic kidney disease, stage 2 (mild): Secondary | ICD-10-CM | POA: Diagnosis not present

## 2016-08-29 DIAGNOSIS — N08 Glomerular disorders in diseases classified elsewhere: Secondary | ICD-10-CM | POA: Diagnosis not present

## 2016-08-29 DIAGNOSIS — I129 Hypertensive chronic kidney disease with stage 1 through stage 4 chronic kidney disease, or unspecified chronic kidney disease: Secondary | ICD-10-CM | POA: Diagnosis not present

## 2016-08-29 DIAGNOSIS — E1122 Type 2 diabetes mellitus with diabetic chronic kidney disease: Secondary | ICD-10-CM | POA: Diagnosis not present

## 2016-08-30 DIAGNOSIS — H401133 Primary open-angle glaucoma, bilateral, severe stage: Secondary | ICD-10-CM | POA: Diagnosis not present

## 2016-08-31 ENCOUNTER — Other Ambulatory Visit: Payer: Self-pay | Admitting: Internal Medicine

## 2016-08-31 DIAGNOSIS — E2839 Other primary ovarian failure: Secondary | ICD-10-CM

## 2016-09-08 ENCOUNTER — Ambulatory Visit
Admission: RE | Admit: 2016-09-08 | Discharge: 2016-09-08 | Disposition: A | Discharge status: Home / Self Care | Payer: Medicare Other | Source: Ambulatory Visit | Attending: Internal Medicine | Admitting: Internal Medicine

## 2016-09-08 DIAGNOSIS — Z780 Asymptomatic menopausal state: Secondary | ICD-10-CM | POA: Diagnosis not present

## 2016-09-08 DIAGNOSIS — E2839 Other primary ovarian failure: Secondary | ICD-10-CM

## 2016-09-08 DIAGNOSIS — Z13820 Encounter for screening for osteoporosis: Secondary | ICD-10-CM | POA: Diagnosis not present

## 2016-10-17 DIAGNOSIS — H401133 Primary open-angle glaucoma, bilateral, severe stage: Secondary | ICD-10-CM | POA: Diagnosis not present

## 2016-11-15 DIAGNOSIS — H903 Sensorineural hearing loss, bilateral: Secondary | ICD-10-CM | POA: Diagnosis not present

## 2016-11-29 DIAGNOSIS — N182 Chronic kidney disease, stage 2 (mild): Secondary | ICD-10-CM | POA: Diagnosis not present

## 2016-11-29 DIAGNOSIS — E1122 Type 2 diabetes mellitus with diabetic chronic kidney disease: Secondary | ICD-10-CM | POA: Diagnosis not present

## 2016-11-29 DIAGNOSIS — N08 Glomerular disorders in diseases classified elsewhere: Secondary | ICD-10-CM | POA: Diagnosis not present

## 2016-11-29 DIAGNOSIS — I129 Hypertensive chronic kidney disease with stage 1 through stage 4 chronic kidney disease, or unspecified chronic kidney disease: Secondary | ICD-10-CM | POA: Diagnosis not present

## 2016-12-06 DIAGNOSIS — H401133 Primary open-angle glaucoma, bilateral, severe stage: Secondary | ICD-10-CM | POA: Diagnosis not present

## 2017-01-05 DIAGNOSIS — L718 Other rosacea: Secondary | ICD-10-CM | POA: Diagnosis not present

## 2017-01-05 DIAGNOSIS — L298 Other pruritus: Secondary | ICD-10-CM | POA: Diagnosis not present

## 2017-01-05 DIAGNOSIS — B078 Other viral warts: Secondary | ICD-10-CM | POA: Diagnosis not present

## 2017-01-29 DIAGNOSIS — N183 Chronic kidney disease, stage 3 (moderate): Secondary | ICD-10-CM | POA: Diagnosis not present

## 2017-01-29 DIAGNOSIS — E083419 Diabetes mellitus due to underlying condition with severe nonproliferative diabetic retinopathy with macular edema, unspecified eye: Secondary | ICD-10-CM | POA: Diagnosis not present

## 2017-01-29 DIAGNOSIS — I129 Hypertensive chronic kidney disease with stage 1 through stage 4 chronic kidney disease, or unspecified chronic kidney disease: Secondary | ICD-10-CM | POA: Diagnosis not present

## 2017-02-24 ENCOUNTER — Emergency Department (HOSPITAL_COMMUNITY)
Admission: EM | Admit: 2017-02-24 | Discharge: 2017-02-24 | Disposition: A | Discharge status: Home / Self Care | Payer: Medicare Other | Attending: Emergency Medicine | Admitting: Emergency Medicine

## 2017-02-24 ENCOUNTER — Encounter (HOSPITAL_COMMUNITY): Payer: Self-pay | Admitting: Emergency Medicine

## 2017-02-24 DIAGNOSIS — E785 Hyperlipidemia, unspecified: Secondary | ICD-10-CM | POA: Diagnosis not present

## 2017-02-24 DIAGNOSIS — Z8673 Personal history of transient ischemic attack (TIA), and cerebral infarction without residual deficits: Secondary | ICD-10-CM | POA: Diagnosis not present

## 2017-02-24 DIAGNOSIS — I10 Essential (primary) hypertension: Secondary | ICD-10-CM | POA: Insufficient documentation

## 2017-02-24 DIAGNOSIS — E11649 Type 2 diabetes mellitus with hypoglycemia without coma: Secondary | ICD-10-CM | POA: Insufficient documentation

## 2017-02-24 DIAGNOSIS — Z7982 Long term (current) use of aspirin: Secondary | ICD-10-CM | POA: Insufficient documentation

## 2017-02-24 DIAGNOSIS — E876 Hypokalemia: Secondary | ICD-10-CM | POA: Diagnosis not present

## 2017-02-24 DIAGNOSIS — E162 Hypoglycemia, unspecified: Secondary | ICD-10-CM

## 2017-02-24 DIAGNOSIS — Z794 Long term (current) use of insulin: Secondary | ICD-10-CM | POA: Insufficient documentation

## 2017-02-24 DIAGNOSIS — Z79899 Other long term (current) drug therapy: Secondary | ICD-10-CM | POA: Diagnosis not present

## 2017-02-24 DIAGNOSIS — R42 Dizziness and giddiness: Secondary | ICD-10-CM | POA: Diagnosis not present

## 2017-02-24 LAB — BASIC METABOLIC PANEL
ANION GAP: 13 (ref 5–15)
Anion gap: 11 (ref 5–15)
BUN: 30 mg/dL — ABNORMAL HIGH (ref 6–20)
BUN: 29 mg/dL — AB (ref 6–20)
CALCIUM: 9.8 mg/dL (ref 8.9–10.3)
CHLORIDE: 102 mmol/L (ref 101–111)
CO2: 27 mmol/L (ref 22–32)
CO2: 24 mmol/L (ref 22–32)
CREATININE: 1.15 mg/dL — AB (ref 0.44–1.00)
Calcium: 8.9 mg/dL (ref 8.9–10.3)
Chloride: 105 mmol/L (ref 101–111)
Creatinine, Ser: 1.24 mg/dL — ABNORMAL HIGH (ref 0.44–1.00)
GFR calc Af Amer: 51 mL/min — ABNORMAL LOW (ref >60)
GFR, EST AFRICAN AMERICAN: 47 mL/min — AB (ref >60)
GFR, EST NON AFRICAN AMERICAN: 41 mL/min — AB (ref >60)
GFR, EST NON AFRICAN AMERICAN: 44 mL/min — AB (ref >60)
GLUCOSE: 60 mg/dL — AB (ref 65–99)
Glucose, Bld: 273 mg/dL — ABNORMAL HIGH (ref 65–99)
POTASSIUM: 3.8 mmol/L (ref 3.5–5.1)
Potassium: 2.3 mmol/L — CL (ref 3.5–5.1)
SODIUM: 137 mmol/L (ref 135–145)
Sodium: 145 mmol/L (ref 135–145)

## 2017-02-24 LAB — I-STAT CHEM 8, ED
BUN: 28 mg/dL — AB (ref 6–20)
CREATININE: 1.2 mg/dL — AB (ref 0.44–1.00)
Calcium, Ion: 1.22 mmol/L (ref 1.15–1.40)
Chloride: 104 mmol/L (ref 101–111)
Glucose, Bld: 56 mg/dL — ABNORMAL LOW (ref 65–99)
HCT: 33 % — ABNORMAL LOW (ref 36.0–46.0)
HEMOGLOBIN: 11.2 g/dL — AB (ref 12.0–15.0)
POTASSIUM: 2.4 mmol/L — AB (ref 3.5–5.1)
Sodium: 145 mmol/L (ref 135–145)
TCO2: 26 mmol/L (ref 22–32)

## 2017-02-24 LAB — CBG MONITORING, ED
GLUCOSE-CAPILLARY: 189 mg/dL — AB (ref 65–99)
Glucose-Capillary: 54 mg/dL — ABNORMAL LOW (ref 65–99)
Glucose-Capillary: 46 mg/dL — ABNORMAL LOW (ref 65–99)

## 2017-02-24 LAB — MAGNESIUM: Magnesium: 1.7 mg/dL (ref 1.7–2.4)

## 2017-02-24 MED ORDER — POTASSIUM CHLORIDE 10 MEQ/100ML IV SOLN
10.0000 meq | Freq: Once | INTRAVENOUS | Status: AC
  Administered 2017-02-24: 10 meq via INTRAVENOUS
  Filled 2017-02-24: qty 100

## 2017-02-24 MED ORDER — POTASSIUM CHLORIDE CRYS ER 20 MEQ PO TBCR: 20.0000 meq | EXTENDED_RELEASE_TABLET | Freq: Every day | ORAL | 0 refills | Status: DC

## 2017-02-24 MED ORDER — MAGNESIUM SULFATE 2 GM/50ML IV SOLN
2.0000 g | Freq: Once | INTRAVENOUS | Status: AC
  Administered 2017-02-24: 2 g via INTRAVENOUS
  Filled 2017-02-24: qty 50

## 2017-02-24 MED ORDER — POTASSIUM CHLORIDE CRYS ER 20 MEQ PO TBCR
40.0000 meq | EXTENDED_RELEASE_TABLET | Freq: Once | ORAL | Status: AC
  Administered 2017-02-24: 40 meq via ORAL
  Filled 2017-02-24: qty 2

## 2017-02-24 NOTE — ED Triage Notes (Signed)
Pt states she woke up from sleep "sweaty" and that when she stood up' she was "real dizzy". Pt states she thinks her sugar was low. Blood glucose upon arrival was 54. Pt given some orange juice and crackers in ED.

## 2017-02-24 NOTE — ED Notes (Signed)
Pt given 8 oz orange juices.

## 2017-02-24 NOTE — ED Notes (Signed)
CRITICAL VALUE ALERT  Critical Value: Potassium 2.3  Date & Time Notied:  02/24/17 @ 8250 Provider Notified:Dr Wickline  Orders Received/Actions taken: None yet

## 2017-02-24 NOTE — ED Provider Notes (Signed)
[facility] EMERGENCY DEPARTMENT Provider Note   CSN: [id number] Arrival date & time: 02/24/17  0244     History   Chief Complaint Chief Complaint  Patient presents with  . Dizziness    HPI [name] [name] is a 76 y.o. female.  The history is provided by the patient.  Dizziness  Quality:  Lightheadedness Severity:  Moderate Onset quality:  Sudden Timing:  Constant Progression:  Improving Chronicity:  New Relieved by:  Fluids Worsened by:  Nothing Associated symptoms: no chest pain, no shortness of breath, no syncope and no vomiting    Pt reports she went to bed feeling well She woke up feeling "sweaty" and dizzy She felt that her glucose may be low but she did not check it She came immediately to ER She had otherwise been well prior to tonight No cp/sob No fever/vomiting No change in diet No change in insulin dosing Past Medical History:  Diagnosis Date  . Diabetes mellitus ([place])   . H/O: CVA (cerebrovascular accident) 2002  . HTN (hypertension)   . Hyperlipidemia   . Pulmonary HTN ([place])    mild by echo    Patient Active Problem List   Diagnosis Date Noted  . Left ventricular diastolic dysfunction, NYHA class 1 02/06/2013  . CVA, old, hemiparesis ([place]) 02/06/2013  . Hyperlipidemia   . HTN (hypertension)   . Diabetes mellitus ([place])     Past Surgical History:  Procedure Laterality Date  . ABDOMINAL HYSTERECTOMY    . CARDIAC CATHETERIZATION  03/17/2003   Normal, patent coronary arteries. Normal LV systolic function  . CARDIOVASCULAR STRESS TEST  08/28/2006   Small sized, moderate ischemiain the Basal Inferoseptal, Basal Inferior, Mid Inferior and Apical Inferior regions  . TRANSTHORACIC ECHOCARDIOGRAM  02/17/2010   EF >55%, moderate-severely thickened septum, mild-moderate mitral annular calcification    OB History    No data available       Home Medications    Prior to Admission medications   Medication Sig Start Date End Date Taking?  Authorizing Provider  ACCU-CHEK AVIVA PLUS test strip  07/14/14  Yes [provider]  ALPHAGAN P 0.1 % SOLN  12/23/12  Yes [provider]  amLODipine (NORVASC) 10 MG tablet Take 10 mg by mouth daily.   Yes [provider]  aspirin 325 MG EC tablet Take 325 mg by mouth daily.   Yes [provider]  B-D UF III MINI PEN NEEDLES 31G X 5 MM MISC  07/14/14  Yes [provider]  FLUZONE HIGH-DOSE injection  12/31/12  Yes [provider]  hydrOXYzine (ATARAX/VISTARIL) 25 MG tablet Take 25 mg by mouth as needed for itching.   Yes [provider]  insulin detemir (LEVEMIR) 100 UNIT/ML injection Inject 15 Units into the skin daily.   Yes [provider]  latanoprost (XALATAN) 0.005 % ophthalmic solution  07/17/14  Yes [provider]  LEVEMIR FLEXTOUCH 100 UNIT/ML Pen  07/14/14  Yes [provider]  magnesium oxide (MAG-OX) 400 MG tablet Take 400 mg by mouth daily.   Yes [provider]  metFORMIN (GLUCOPHAGE) 1000 MG tablet Take 1,000 mg by mouth 2 (two) times daily with a meal.   Yes [provider]  metoprolol succinate (TOPROL XL) 25 MG 24 hr tablet Take 1 tablet (25 mg total) by mouth daily. NEED APPOINTMENT FOR FUTURE REFILLS. 04/06/14  Yes Troy Sine, MD  Multiple Vitamin (MULTIVITAMIN) tablet Take 1 tablet by mouth daily.   Yes [provider]  pantoprazole (PROTONIX) 40 MG tablet Take 40 mg by mouth daily.   Yes [provider]  potassium chloride SA (K-DUR,KLOR-CON) 20 MEQ tablet Take 1 tablet by mouth daily. 01/03/13  Yes [provider]  rosuvastatin (CRESTOR) 10 MG tablet Take 10 mg by mouth daily.   Yes [provider]  TIMOLOL MALEATE OP Place 1 drop into both eyes daily.   Yes [provider]  valsartan-hydrochlorothiazide (DIOVAN-HCT) 320-25 MG per tablet Take 1 tablet by mouth daily.   Yes [provider]  diclofenac (CATAFLAM) 50 MG  tablet Take 1 tablet (50 mg total) by mouth 2 (two) times daily. 06/15/15   Carole Civil, MD    Family History History reviewed. No pertinent family history.  Social History Social History   Tobacco Use  . Smoking status: Never Smoker  . Smokeless tobacco: Never Used  Substance Use Topics  . Alcohol use: No  . Drug use: Not on file     Allergies   Brimonidine and Timolol   Review of Systems Review of Systems  Constitutional: Negative for fever.  Respiratory: Negative for shortness of breath.   Cardiovascular: Negative for chest pain and syncope.  Gastrointestinal: Negative for vomiting.  Neurological: Positive for dizziness. Negative for syncope.  All other systems reviewed and are negative.    Physical Exam Updated Vital Signs BP (!) 174/71 (BP Location: Left Arm)   Pulse 99   Resp 17   Ht 1.575 m (5\' 2" )   Wt 65.3 kg (144 lb)   SpO2 95%   BMI 26.34 kg/m   Physical Exam  CONSTITUTIONAL: Well developed/well nourished, watching TV, no distress HEAD: Normocephalic/atraumatic EYES: EOMI/PERRL ENMT: Mucous membranes moist NECK: supple no meningeal signs SPINE/BACK:entire spine nontender CV: S1/S2 noted, no murmurs/rubs/gallops noted LUNGS: Lungs are clear to auscultation bilaterally, no apparent distress ABDOMEN: soft, nontender  GU:no cva tenderness NEURO: Pt is awake/alert/appropriate, moves all extremitiesx4.  No facial droop.   EXTREMITIES:  full ROM SKIN: warm, color normal PSYCH: no abnormalities of mood noted, alert and oriented to situation  ED Treatments / Results  Labs (all labs ordered are listed, but only abnormal results are displayed) Labs Reviewed  BASIC METABOLIC PANEL - Abnormal; Notable for the following components:      Result Value   Potassium 2.3 (*)    Glucose, Bld 60 (*)    BUN 30 (*)    Creatinine, Ser 1.24 (*)    GFR calc non Af Amer 41 (*)    GFR calc Af Amer 47 (*)    All other components within normal limits  BASIC  METABOLIC PANEL - Abnormal; Notable for the following components:   Glucose, Bld 273 (*)    BUN 29 (*)    Creatinine, Ser 1.15 (*)    GFR calc non Af Amer 44 (*)    GFR calc Af Amer 51 (*)    All other components within normal limits  CBG MONITORING, ED - Abnormal; Notable for the following components:   Glucose-Capillary 54 (*)    All other components within normal limits  I-STAT CHEM 8, ED - Abnormal; Notable for the following components:   Potassium 2.4 (*)    BUN 28 (*)    Creatinine, Ser 1.20 (*)    Glucose, Bld 56 (*)    Hemoglobin 11.2 (*)    HCT 33.0 (*)    All other components within normal limits  CBG MONITORING, ED - Abnormal; Notable for the  following components:   Glucose-Capillary 46 (*)    All other components within normal limits  CBG MONITORING, ED - Abnormal; Notable for the following components:   Glucose-Capillary 189 (*)    All other components within normal limits  MAGNESIUM    EKG  EKG Interpretation  Date/Time:  Saturday February 24 2017 03:14:41 EST Ventricular Rate:  95 PR Interval:    QRS Duration: 142 QT Interval:  389 QTC Calculation: 489 R Axis:   -10 Text Interpretation:  Sinus rhythm Left bundle branch block Abnormal ekg Confirmed by Ripley Fraise [id number]) on 02/24/2017 3:25:17 AM       EKG Interpretation  Date/Time:  Saturday February 24 2017 07:06:29 EST Ventricular Rate:  95 PR Interval:    QRS Duration: 130 QT Interval:  372 QTC Calculation: 468 R Axis:   -5 Text Interpretation:  Sinus rhythm Left bundle branch block No significant change since last tracing Confirmed by Ripley Fraise [phone]) on 02/24/2017 7:13:29 AM       Radiology No results found.  Procedures Procedures   Medications Ordered in ED Medications  potassium chloride SA (K-DUR,KLOR-CON) CR tablet 40 mEq (40 mEq Oral Given 02/24/17 0355)  potassium chloride 10 mEq in 100 mL IVPB (0 mEq Intravenous Stopped 02/24/17 0512)  potassium chloride 10 mEq in  100 mL IVPB (0 mEq Intravenous Stopped 02/24/17 0617)  magnesium sulfate IVPB 2 g 50 mL (0 g Intravenous Stopped 02/24/17 0617)     Initial Impression / Assessment and Plan / ED Course  I have reviewed the triage vital signs and the nursing notes.  Pertinent labs results that were available during my care of the patient were reviewed by me and considered in my medical decision making (see chart for details).     4:04 AM Pt found to have hypoglycemia on arrival, drank OJ and starting to improve No focal weakness noted She is noted to have significant HYPOkalemia Will replenish while in the ED and keep on cardiac monitor    Pt improved Potassium improved No episode of hypoglycemia She is awake/alert, no complaints She is ready for d/c home  She is on Diovan=HCT, likely culprit of HYPoKalemia Will give course of KCL and f/u with PCP next week for labs Also needs to monitor glucose prior to giving herself insulin at night  As for EKG, doubt these are acute changes as she denies cp/sob at this time  Final Clinical Impressions(s) / ED Diagnoses   Final diagnoses:  Hypokalemia  Hypoglycemia    ED Discharge Orders        Ordered    potassium chloride SA (K-DUR,KLOR-CON) 20 MEQ tablet  Daily     02/24/17 0714       Ripley Fraise, MD 02/24/17 0740

## 2017-02-24 NOTE — ED Notes (Signed)
Pt's blood sugar was 46. EDP notified. Orders received. Pt given graham crackers and peanut butter with 1 4 oz. apple juice and 4 oz. oj.

## 2017-02-28 DIAGNOSIS — E1122 Type 2 diabetes mellitus with diabetic chronic kidney disease: Secondary | ICD-10-CM | POA: Diagnosis not present

## 2017-02-28 DIAGNOSIS — N183 Chronic kidney disease, stage 3 (moderate): Secondary | ICD-10-CM | POA: Diagnosis not present

## 2017-02-28 DIAGNOSIS — E162 Hypoglycemia, unspecified: Secondary | ICD-10-CM | POA: Diagnosis not present

## 2017-02-28 DIAGNOSIS — E876 Hypokalemia: Secondary | ICD-10-CM | POA: Diagnosis not present

## 2017-03-12 DIAGNOSIS — H401133 Primary open-angle glaucoma, bilateral, severe stage: Secondary | ICD-10-CM | POA: Diagnosis not present

## 2017-03-26 DIAGNOSIS — E1122 Type 2 diabetes mellitus with diabetic chronic kidney disease: Secondary | ICD-10-CM | POA: Diagnosis not present

## 2017-03-26 DIAGNOSIS — N183 Chronic kidney disease, stage 3 (moderate): Secondary | ICD-10-CM | POA: Diagnosis not present

## 2017-03-26 DIAGNOSIS — I129 Hypertensive chronic kidney disease with stage 1 through stage 4 chronic kidney disease, or unspecified chronic kidney disease: Secondary | ICD-10-CM | POA: Diagnosis not present

## 2017-03-26 DIAGNOSIS — H269 Unspecified cataract: Secondary | ICD-10-CM | POA: Diagnosis not present

## 2017-05-04 DIAGNOSIS — H25813 Combined forms of age-related cataract, bilateral: Secondary | ICD-10-CM | POA: Diagnosis not present

## 2017-05-04 DIAGNOSIS — H524 Presbyopia: Secondary | ICD-10-CM | POA: Diagnosis not present

## 2017-05-04 DIAGNOSIS — E113413 Type 2 diabetes mellitus with severe nonproliferative diabetic retinopathy with macular edema, bilateral: Secondary | ICD-10-CM | POA: Diagnosis not present

## 2017-05-04 DIAGNOSIS — H401133 Primary open-angle glaucoma, bilateral, severe stage: Secondary | ICD-10-CM | POA: Diagnosis not present

## 2017-05-22 DIAGNOSIS — I129 Hypertensive chronic kidney disease with stage 1 through stage 4 chronic kidney disease, or unspecified chronic kidney disease: Secondary | ICD-10-CM | POA: Diagnosis not present

## 2017-05-22 DIAGNOSIS — N182 Chronic kidney disease, stage 2 (mild): Secondary | ICD-10-CM | POA: Diagnosis not present

## 2017-05-22 DIAGNOSIS — E1122 Type 2 diabetes mellitus with diabetic chronic kidney disease: Secondary | ICD-10-CM | POA: Diagnosis not present

## 2017-05-22 DIAGNOSIS — N08 Glomerular disorders in diseases classified elsewhere: Secondary | ICD-10-CM | POA: Diagnosis not present

## 2017-05-24 DIAGNOSIS — H5440 Blindness, one eye, unspecified eye: Secondary | ICD-10-CM | POA: Diagnosis not present

## 2017-05-24 DIAGNOSIS — H25813 Combined forms of age-related cataract, bilateral: Secondary | ICD-10-CM | POA: Diagnosis not present

## 2017-05-24 DIAGNOSIS — E113311 Type 2 diabetes mellitus with moderate nonproliferative diabetic retinopathy with macular edema, right eye: Secondary | ICD-10-CM | POA: Diagnosis not present

## 2017-05-24 DIAGNOSIS — H401133 Primary open-angle glaucoma, bilateral, severe stage: Secondary | ICD-10-CM | POA: Diagnosis not present

## 2017-05-24 DIAGNOSIS — H34812 Central retinal vein occlusion, left eye, with macular edema: Secondary | ICD-10-CM | POA: Diagnosis not present

## 2017-06-08 DIAGNOSIS — E1165 Type 2 diabetes mellitus with hyperglycemia: Secondary | ICD-10-CM | POA: Diagnosis not present

## 2017-06-27 ENCOUNTER — Other Ambulatory Visit: Payer: Self-pay | Admitting: Internal Medicine

## 2017-06-27 DIAGNOSIS — Z1231 Encounter for screening mammogram for malignant neoplasm of breast: Secondary | ICD-10-CM

## 2017-06-28 DIAGNOSIS — H34812 Central retinal vein occlusion, left eye, with macular edema: Secondary | ICD-10-CM | POA: Diagnosis not present

## 2017-06-28 DIAGNOSIS — E113311 Type 2 diabetes mellitus with moderate nonproliferative diabetic retinopathy with macular edema, right eye: Secondary | ICD-10-CM | POA: Diagnosis not present

## 2017-07-25 ENCOUNTER — Ambulatory Visit
Admission: RE | Admit: 2017-07-25 | Discharge: 2017-07-25 | Disposition: A | Discharge status: Home / Self Care | Payer: Medicare Other | Source: Ambulatory Visit | Attending: Internal Medicine | Admitting: Internal Medicine

## 2017-07-25 DIAGNOSIS — Z1231 Encounter for screening mammogram for malignant neoplasm of breast: Secondary | ICD-10-CM

## 2017-08-09 DIAGNOSIS — H34812 Central retinal vein occlusion, left eye, with macular edema: Secondary | ICD-10-CM | POA: Diagnosis not present

## 2017-08-09 DIAGNOSIS — E113311 Type 2 diabetes mellitus with moderate nonproliferative diabetic retinopathy with macular edema, right eye: Secondary | ICD-10-CM | POA: Diagnosis not present

## 2017-08-14 DIAGNOSIS — E1122 Type 2 diabetes mellitus with diabetic chronic kidney disease: Secondary | ICD-10-CM | POA: Diagnosis not present

## 2017-08-14 DIAGNOSIS — I129 Hypertensive chronic kidney disease with stage 1 through stage 4 chronic kidney disease, or unspecified chronic kidney disease: Secondary | ICD-10-CM | POA: Diagnosis not present

## 2017-08-14 DIAGNOSIS — Z1389 Encounter for screening for other disorder: Secondary | ICD-10-CM | POA: Diagnosis not present

## 2017-08-14 DIAGNOSIS — N08 Glomerular disorders in diseases classified elsewhere: Secondary | ICD-10-CM | POA: Diagnosis not present

## 2017-08-14 DIAGNOSIS — N183 Chronic kidney disease, stage 3 (moderate): Secondary | ICD-10-CM | POA: Diagnosis not present

## 2017-09-10 DIAGNOSIS — I129 Hypertensive chronic kidney disease with stage 1 through stage 4 chronic kidney disease, or unspecified chronic kidney disease: Secondary | ICD-10-CM | POA: Diagnosis not present

## 2017-09-10 DIAGNOSIS — E1122 Type 2 diabetes mellitus with diabetic chronic kidney disease: Secondary | ICD-10-CM | POA: Diagnosis not present

## 2017-09-10 DIAGNOSIS — N08 Glomerular disorders in diseases classified elsewhere: Secondary | ICD-10-CM | POA: Diagnosis not present

## 2017-09-10 DIAGNOSIS — N183 Chronic kidney disease, stage 3 (moderate): Secondary | ICD-10-CM | POA: Diagnosis not present

## 2017-09-20 DIAGNOSIS — E113311 Type 2 diabetes mellitus with moderate nonproliferative diabetic retinopathy with macular edema, right eye: Secondary | ICD-10-CM | POA: Diagnosis not present

## 2017-09-20 DIAGNOSIS — H34812 Central retinal vein occlusion, left eye, with macular edema: Secondary | ICD-10-CM | POA: Diagnosis not present

## 2017-12-11 DIAGNOSIS — E1122 Type 2 diabetes mellitus with diabetic chronic kidney disease: Secondary | ICD-10-CM | POA: Diagnosis not present

## 2017-12-11 DIAGNOSIS — I129 Hypertensive chronic kidney disease with stage 1 through stage 4 chronic kidney disease, or unspecified chronic kidney disease: Secondary | ICD-10-CM | POA: Diagnosis not present

## 2017-12-11 DIAGNOSIS — N08 Glomerular disorders in diseases classified elsewhere: Secondary | ICD-10-CM | POA: Diagnosis not present

## 2017-12-11 DIAGNOSIS — E559 Vitamin D deficiency, unspecified: Secondary | ICD-10-CM

## 2017-12-11 DIAGNOSIS — N183 Chronic kidney disease, stage 3 (moderate): Secondary | ICD-10-CM | POA: Diagnosis not present

## 2018-01-22 ENCOUNTER — Telehealth: Payer: Self-pay | Admitting: Endocrinology

## 2018-01-22 ENCOUNTER — Encounter: Payer: Self-pay | Admitting: Endocrinology

## 2018-01-22 NOTE — Telephone Encounter (Signed)
Ms. Leon is returning call. Will need to call lab to get critical before speaking with patient. Will return call later today.

## 2018-01-30 ENCOUNTER — Telehealth: Payer: Self-pay

## 2018-01-30 NOTE — Telephone Encounter (Signed)
New patient referral and chart notes placed on Dr. Cruzita Lederer desk.

## 2018-02-04 ENCOUNTER — Other Ambulatory Visit: Payer: Self-pay | Admitting: Internal Medicine

## 2018-02-12 ENCOUNTER — Other Ambulatory Visit: Payer: Self-pay

## 2018-02-12 DIAGNOSIS — N182 Chronic kidney disease, stage 2 (mild): Secondary | ICD-10-CM

## 2018-02-12 DIAGNOSIS — E1122 Type 2 diabetes mellitus with diabetic chronic kidney disease: Secondary | ICD-10-CM

## 2018-02-12 MED ORDER — ACCU-CHEK AVIVA DEVI: 0 refills | Status: AC

## 2018-02-12 MED ORDER — GLUCOSE BLOOD VI STRP: ORAL_STRIP | 12 refills | Status: DC

## 2018-02-12 MED ORDER — ACCU-CHEK SOFT TOUCH LANCETS MISC: 12 refills | Status: DC

## 2018-02-26 ENCOUNTER — Encounter: Payer: Self-pay | Admitting: Internal Medicine

## 2018-02-26 ENCOUNTER — Ambulatory Visit (INDEPENDENT_AMBULATORY_CARE_PROVIDER_SITE_OTHER): Payer: Medicare Other | Admitting: Internal Medicine

## 2018-02-26 VITALS — BP 170/80 | HR 90 | Ht 60.0 in | Wt 146.0 lb

## 2018-02-26 DIAGNOSIS — E1165 Type 2 diabetes mellitus with hyperglycemia: Secondary | ICD-10-CM | POA: Diagnosis not present

## 2018-02-26 DIAGNOSIS — E1159 Type 2 diabetes mellitus with other circulatory complications: Secondary | ICD-10-CM

## 2018-02-26 DIAGNOSIS — Z794 Long term (current) use of insulin: Secondary | ICD-10-CM

## 2018-02-26 DIAGNOSIS — IMO0002 Reserved for concepts with insufficient information to code with codable children: Secondary | ICD-10-CM

## 2018-02-26 LAB — POCT GLYCOSYLATED HEMOGLOBIN (HGB A1C): Hemoglobin A1C: 8.4 % — AB (ref 4.0–5.6)

## 2018-02-26 MED ORDER — GLIPIZIDE ER 2.5 MG PO TB24: 2.5000 mg | ORAL_TABLET | Freq: Every day | ORAL | 3 refills | Status: DC

## 2018-02-26 NOTE — Patient Instructions (Signed)
Please continue: - Metformin ER 750 mg 2x a day - Tresiba 28 units daily  Please start: - Glipizide ER 2.5 mg before b'fast  You can also crush a Glipizide tablet before a large meal.  Stop Onglyza when you run out.  Please let me know if the sugars are consistently <80 or >200.  Please come back for a follow-up appointment in 3 months.  PATIENT INSTRUCTIONS FOR TYPE 2 DIABETES:  **Please join MyChart!** - see attached instructions about how to join if you have not done so already.  DIET AND EXERCISE Diet and exercise is an important part of diabetic treatment.  We recommended aerobic exercise in the form of brisk walking (working between 40-60% of maximal aerobic capacity, similar to brisk walking) for 150 minutes per week (such as 30 minutes five days per week) along with 3 times per week performing 'resistance' training (using various gauge rubber tubes with handles) 5-10 exercises involving the major muscle groups (upper body, lower body and core) performing 10-15 repetitions (or near fatigue) each exercise. Start at half the above goal but build slowly to reach the above goals. If limited by weight, joint pain, or disability, we recommend daily walking in a swimming pool with water up to waist to reduce pressure from joints while allow for adequate exercise.    BLOOD GLUCOSES Monitoring your blood glucoses is important for continued management of your diabetes. Please check your blood glucoses 2-4 times a day: fasting, before meals and at bedtime (you can rotate these measurements - e.g. one day check before the 3 meals, the next day check before 2 of the meals and before bedtime, etc.).   HYPOGLYCEMIA (low blood sugar) Hypoglycemia is usually a reaction to not eating, exercising, or taking too much insulin/ other diabetes drugs.  Symptoms include tremors, sweating, hunger, confusion, headache, etc. Treat IMMEDIATELY with 15 grams of Carbs: . 4 glucose tablets .  cup regular  juice/soda . 2 tablespoons raisins . 4 teaspoons sugar . 1 tablespoon honey Recheck blood glucose in 15 mins and repeat above if still symptomatic/blood glucose <100.  RECOMMENDATIONS TO REDUCE YOUR RISK OF DIABETIC COMPLICATIONS: * Take your prescribed MEDICATION(S) * Follow a DIABETIC diet: Complex carbs, fiber rich foods, (monounsaturated and polyunsaturated) fats * AVOID saturated/trans fats, high fat foods, >2,300 mg salt per day. * EXERCISE at least 5 times a week for 30 minutes or preferably daily.  * DO NOT SMOKE OR DRINK more than 1 drink a day. * Check your FEET every day. Do not wear tightfitting shoes. Contact us if you develop an ulcer * See your EYE doctor once a year or more if needed * Get a FLU shot once a year * Get a PNEUMONIA vaccine once before and once after age 33 years  GOALS:  * Your Hemoglobin A1c of <7%  * fasting sugars need to be <130 * after meals sugars need to be <180 (2h after you start eating) * Your Systolic BP should be 258 or lower  * Your Diastolic BP should be 80 or lower  * Your HDL (Good Cholesterol) should be 40 or higher  * Your LDL (Bad Cholesterol) should be 100 or lower. * Your Triglycerides should be 150 or lower  * Your Urine microalbumin (kidney function) should be <30 * Your Body Mass Index should be 25 or lower    Please consider the following ways to cut down carbs and fat and increase fiber and micronutrients in your diet: - substitute  whole grain for white bread or pasta - substitute brown rice for white rice - substitute 90-calorie flat bread pieces for slices of bread when possible - substitute sweet potatoes or yams for white potatoes - substitute humus for margarine - substitute tofu for cheese when possible - substitute almond or rice milk for regular milk (would not drink soy milk daily due to concern for soy estrogen influence on breast cancer risk) - substitute dark chocolate for other sweets when possible -  substitute water - can add lemon or orange slices for taste - for diet sodas (artificial sweeteners will trick your body that you can eat sweets without getting calories and will lead you to overeating and weight gain in the long run) - do not skip breakfast or other meals (this will slow down the metabolism and will result in more weight gain over time)  - can try smoothies made from fruit and almond/rice milk in am instead of regular breakfast - can also try old-fashioned (not instant) oatmeal made with almond/rice milk in am - order the dressing on the side when eating salad at a restaurant (pour less than half of the dressing on the salad) - eat as little meat as possible - can try juicing, but should not forget that juicing will get rid of the fiber, so would alternate with eating raw veg./fruits or drinking smoothies - use as little oil as possible, even when using olive oil - can dress a salad with a mix of balsamic vinegar and lemon juice, for e.g. - use agave nectar, stevia sugar, or regular sugar rather than artificial sweateners - steam or broil/roast veggies  - snack on veggies/fruit/nuts (unsalted, preferably) when possible, rather than processed foods - reduce or eliminate aspartame in diet (it is in diet sodas, chewing gum, etc) Read the labels!  Try to read Dr. Janene Harvey book: "Program for Reversing Diabetes" for other ideas for healthy eating.

## 2018-02-26 NOTE — Addendum Note (Signed)
Addended by: Cardell Peach I on: 02/26/2018 09:55 AM   Modules accepted: Orders

## 2018-02-26 NOTE — Progress Notes (Signed)
Patient ID: [name] [name], female   DOB: [birth year], 77 y.o.   MRN: [id number]   HPI: [name] [name] is a 77 y.o.-year-old female, referred by her PCP, Dr. Baird Cancer, for management of DM2, dx in the 1980s, insulin-dependent since ~2010, uncontrolled, with complications (dCHF, Cerebrovascular disease - history of CVA 2002, CKD, DR, PN).  Last hemoglobin A1c was: 09/10/2017: HbA1c 10.4% No results found for: HGBA1C  Pt is on a regimen of: - Metformin ER 750 mg 2x a day - Onglyza 5 mg before breakfast - but expensive! - Tresiba 28 units daily at night She was previously also on Levemir 35 units daily.  Pt checks her sugars 1x a day and they are -per review of her log: - am: 57-253, but last 2 weeks: 69, 85-138, 175 - 2h after b'fast: n/c - before lunch: n/c - 2h after lunch: n/c - before dinner: n/c - 2h after dinner: n/c - bedtime: n/c - nighttime: n/c + lows. Lowest sugar was 50s in Spring >> went to the ED >> more recently: 65; she has hypoglycemia awareness at 70.  Highest sugar was 253.  Glucometer: Accu-Chek Aviva  Pt's meals are: - Breakfast: oatmeal + bacon - Lunch: veggies and a piece of chicken - small meal - Dinner: chicken, veggies - Snacks: apples, oranges  -+ CKD, last BUN/creatinine:  09/10/2017: 27/1.52, GFR 37 Lab Results  Component Value Date   BUN 29 (H) 02/24/2017   BUN 28 (H) 02/24/2017   CREATININE 1.15 (H) 02/24/2017   CREATININE 1.20 (H) 02/24/2017  On valsartan 320.  -+ HL; lipids not available for review: No results found for: CHOL, HDL, LDLCALC, LDLDIRECT, TRIG, CHOLHDL  On Crestor 10.  - last eye exam was in 08/2017. + DR. + IO injections q6 weeks. Stroke affected L eye vision.  - no numbness but has + tingling in her feet.  She is on ASA 325.  Pt has FH of DM in father.  ROS: Constitutional: no weight gain, no weight loss, no fatigue, no subjective hyperthermia, no subjective hypothermia, no nocturia Eyes: no blurry vision, no  xerophthalmia ENT: no sore throat, no nodules palpated in neck, no dysphagia, no odynophagia, no hoarseness, no tinnitus, no hypoacusis Cardiovascular: no CP, no SOB, no palpitations, no leg swelling Respiratory: no cough, no SOB, no wheezing Gastrointestinal: no N, no V, no D, no C, no acid reflux Musculoskeletal: no muscle, no joint aches Skin: no rash, no hair loss Neurological: no tremors, no numbness or tingling/no dizziness/no HAs Psychiatric: no depression, no anxiety  Past Medical History:  Diagnosis Date  . Diabetes mellitus ([place])   . H/O: CVA (cerebrovascular accident) 2002  . HTN (hypertension)   . Hyperlipidemia   . Pulmonary HTN ([place])    mild by echo   Past Surgical History:  Procedure Laterality Date  . ABDOMINAL HYSTERECTOMY    . CARDIAC CATHETERIZATION  03/17/2003   Normal, patent coronary arteries. Normal LV systolic function  . CARDIOVASCULAR STRESS TEST  08/28/2006   Small sized, moderate ischemiain the Basal Inferoseptal, Basal Inferior, Mid Inferior and Apical Inferior regions  . TRANSTHORACIC ECHOCARDIOGRAM  02/17/2010   EF >55%, moderate-severely thickened septum, mild-moderate mitral annular calcification   Social History   Socioeconomic History  . Marital status: Married    Spouse name: Not on file  . Number of children: Not on file  . Years of education: Not on file  . Highest education level: Not on file  Occupational History  . Not  on file  Social Needs  . Financial resource strain: Not on file  . Food insecurity:    Worry: Not on file    Inability: Not on file  . Transportation needs:    Medical: Not on file    Non-medical: Not on file  Tobacco Use  . Smoking status: Never Smoker  . Smokeless tobacco: Never Used  Substance and Sexual Activity  . Alcohol use: No  . Drug use: Not on file  . Sexual activity: Not on file  Lifestyle  . Physical activity:    Days per week: Not on file    Minutes per session: Not on file  . Stress: Not on  file  Relationships  . Social connections:    Talks on phone: Not on file    Gets together: Not on file    Attends religious service: Not on file    Active member of club or organization: Not on file    Attends meetings of clubs or organizations: Not on file    Relationship status: Not on file  . Intimate partner violence:    Fear of current or ex partner: Not on file    Emotionally abused: Not on file    Physically abused: Not on file    Forced sexual activity: Not on file  Other Topics Concern  . Not on file  Social History Narrative  . Not on file   Current Outpatient Medications on File Prior to Visit  Medication Sig Dispense Refill  . ACCU-CHEK AVIVA PLUS test strip   1  . ALPHAGAN P 0.1 % SOLN     . amLODipine (NORVASC) 10 MG tablet Take 10 mg by mouth daily.    Marland Kitchen aspirin 325 MG EC tablet Take 325 mg by mouth daily.    . B-D UF III MINI PEN NEEDLES 31G X 5 MM MISC   1  . Blood Glucose Monitoring Suppl (ACCU-CHEK AVIVA) device Check blood sugars 3 times daily E11.65 1 each 0  . FLUZONE HIGH-DOSE injection     . glucose blood (ACCU-CHEK AVIVA PLUS) test strip Check blood sugars 3 times daily E11.65 100 each 12  . hydrOXYzine (ATARAX/VISTARIL) 25 MG tablet Take 25 mg by mouth as needed for itching.    . insulin detemir (LEVEMIR) 100 UNIT/ML injection Inject 15 Units into the skin daily.    . Lancets (ACCU-CHEK SOFT TOUCH) lancets Check blood sugars 3 times daily E11.65 100 each 12  . latanoprost (XALATAN) 0.005 % ophthalmic solution   1  . LEVEMIR FLEXTOUCH 100 UNIT/ML Pen     . magnesium oxide (MAG-OX) 400 MG tablet Take 400 mg by mouth daily.    . metFORMIN (GLUCOPHAGE) 1000 MG tablet Take 1,000 mg by mouth 2 (two) times daily with a meal.    . metoprolol succinate (TOPROL-XL) 25 MG 24 hr tablet TAKE 1 TABLET BY MOUTH ONCE DAILY 90 tablet 0  . Multiple Vitamin (MULTIVITAMIN) tablet Take 1 tablet by mouth daily.    . pantoprazole (PROTONIX) 40 MG tablet Take 40 mg by mouth  daily.    . potassium chloride SA (K-DUR,KLOR-CON) 20 MEQ tablet Take 1 tablet (20 mEq total) daily by mouth. 5 tablet 0  . rosuvastatin (CRESTOR) 10 MG tablet Take 10 mg by mouth daily.    Marland Kitchen TIMOLOL MALEATE OP Place 1 drop into both eyes daily.    . valsartan-hydrochlorothiazide (DIOVAN-HCT) 320-25 MG per tablet Take 1 tablet by mouth daily.    . metFORMIN (GLUCOPHAGE-XR)  750 MG 24 hr tablet TK 1 T PO BID WF  1  . ONGLYZA 5 MG TABS tablet     . TRESIBA FLEXTOUCH 200 UNIT/ML SOPN INJECT 30 UNITS INTO THE SKIN ONCE DAILY AT BEDTIME  2   No current facility-administered medications on file prior to visit.    Allergies  Allergen Reactions  . Brimonidine Other (See Comments)    Eyes swell and itch  . Dorzolamide     Other reaction(s): Other (See Comments) Red, inflamed eyelid, dermatitis   . Timolol Other (See Comments)    Caused eyes to swell and itch   No family history on file.  PE: BP (!) 170/80   Pulse 90   Ht 5' (1.524 m) Comment: measured  Wt 146 lb (66.2 kg)   SpO2 98%   BMI 28.51 kg/m  On repeat BP: 160/80. Wt Readings from Last 3 Encounters:  02/26/18 146 lb (66.2 kg)  02/24/17 144 lb (65.3 kg)  07/27/15 144 lb (65.3 kg)   Constitutional: slightly overweight, in NAD Eyes: PERRLA, EOMI, no exophthalmos ENT: moist mucous membranes, no thyromegaly, no cervical lymphadenopathy Cardiovascular: RRR, No MRG Respiratory: CTA B Gastrointestinal: abdomen soft, NT, ND, BS+ Musculoskeletal: no deformities, strength intact in all 4 Skin: moist, warm, no rashes Neurological: no tremor with outstretched hands, DTR normal in all 4  ASSESSMENT: 1. DM2, insulin-dependent, uncontrolled, with long-term complications - dCHF - Cerebrovascular disease, history of CVA 2002 - CKD - DR - PN  PLAN:  1. Patient with long-standing, uncontrolled diabetes, on oral antidiabetic regimen + basal insulin, which became insufficient. HbA1c today: 8.4% (improved).  In the last 2 weeks, she  started to improve her diet and her sugars are significantly better.  Unfortunately, she is only checking in the morning and I strongly advised her to check later in the day, also, rotating check times.  I also gave her a log and explained the times of the day at which she needs to start checking her sugars.  We also discussed about CBG targets for treatment. - At this visit, I strongly advised her to continue improving her diet and I made specific suggestions about further improvements. - For now, we will need to stop Onglyza since she cannot afford it.  She ran out of it 4 days ago.  Instead, I believe that she will need some mealtime coverage and I advised her to start the lowest dose of glipizide ER, 2.5 mg daily.  I advised her to take this before breakfast.  We also discussed that if she has a larger meal (for example a holiday meal), she may take a crushed glipizide ER before that meal.  We will continue the metformin and the Tresiba by the current doses. - I suggested to:  Patient Instructions  Please continue: - Metformin ER 750 mg 2x a day - Tresiba 28 units daily  Please start: - Glipizide ER 2.5 mg before b'fast  You can also crush a Glipizide tablet before a large meal.  Stop Onglyza when you run out.  Please let me know if the sugars are consistently <80 or >200.  Please come back for a follow-up appointment in 3 months.  - given sugar log and advised how to fill it and to bring it at next appt  - given foot care handout and explained the principles  - given instructions for hypoglycemia management "15-15 rule"  - advised for yearly eye exams.  She is up-to-date. - She is also up-to-date  with flu shot - Return to clinic in 3 mo with sugar log   Philemon Kingdom, MD PhD [facility]

## 2018-03-27 ENCOUNTER — Ambulatory Visit: Payer: Medicare Other | Admitting: Internal Medicine

## 2018-04-16 ENCOUNTER — Encounter: Payer: Self-pay | Admitting: Internal Medicine

## 2018-04-16 ENCOUNTER — Other Ambulatory Visit: Payer: Self-pay | Admitting: Internal Medicine

## 2018-04-16 ENCOUNTER — Ambulatory Visit (INDEPENDENT_AMBULATORY_CARE_PROVIDER_SITE_OTHER): Payer: Medicare Other | Admitting: Internal Medicine

## 2018-04-16 VITALS — BP 164/88 | HR 88 | Temp 97.5°F | Ht 60.0 in | Wt 147.2 lb

## 2018-04-16 DIAGNOSIS — I129 Hypertensive chronic kidney disease with stage 1 through stage 4 chronic kidney disease, or unspecified chronic kidney disease: Secondary | ICD-10-CM

## 2018-04-16 DIAGNOSIS — Z7982 Long term (current) use of aspirin: Secondary | ICD-10-CM

## 2018-04-16 DIAGNOSIS — N182 Chronic kidney disease, stage 2 (mild): Secondary | ICD-10-CM | POA: Diagnosis not present

## 2018-04-16 MED ORDER — HYDRALAZINE HCL 25 MG PO TABS: 25.0000 mg | ORAL_TABLET | Freq: Two times a day (BID) | ORAL | 1 refills | Status: DC

## 2018-04-16 NOTE — Patient Instructions (Signed)
DASH Eating Plan  DASH stands for "Dietary Approaches to Stop Hypertension." The DASH eating plan is a healthy eating plan that has been shown to reduce high blood pressure (hypertension). It may also reduce your risk for type 2 diabetes, heart disease, and stroke. The DASH eating plan may also help with weight loss.  What are tips for following this plan?    General guidelines   Avoid eating more than 2,300 mg (milligrams) of salt (sodium) a day. If you have hypertension, you may need to reduce your sodium intake to 1,500 mg a day.   Limit alcohol intake to no more than 1 drink a day for nonpregnant women and 2 drinks a day for men. One drink equals 12 oz of beer, 5 oz of wine, or 1 oz of hard liquor.   Work with your health care provider to maintain a healthy body weight or to lose weight. Ask what an ideal weight is for you.   Get at least 30 minutes of exercise that causes your heart to beat faster (aerobic exercise) most days of the week. Activities may include walking, swimming, or biking.   Work with your health care provider or diet and nutrition specialist (dietitian) to adjust your eating plan to your individual calorie needs.  Reading food labels     Check food labels for the amount of sodium per serving. Choose foods with less than 5 percent of the Daily Value of sodium. Generally, foods with less than 300 mg of sodium per serving fit into this eating plan.   To find whole grains, look for the word "whole" as the first word in the ingredient list.  Shopping   Buy products labeled as "low-sodium" or "no salt added."   Buy fresh foods. Avoid canned foods and premade or frozen meals.  Cooking   Avoid adding salt when cooking. Use salt-free seasonings or herbs instead of table salt or sea salt. Check with your health care provider or pharmacist before using salt substitutes.   Do not fry foods. Cook foods using healthy methods such as baking, boiling, grilling, and broiling instead.   Cook with  heart-healthy oils, such as olive, canola, soybean, or sunflower oil.  Meal planning   Eat a balanced diet that includes:  ? 5 or more servings of fruits and vegetables each day. At each meal, try to fill half of your plate with fruits and vegetables.  ? Up to 6-8 servings of whole grains each day.  ? Less than 6 oz of lean meat, poultry, or fish each day. A 3-oz serving of meat is about the same size as a deck of cards. One egg equals 1 oz.  ? 2 servings of low-fat dairy each day.  ? A serving of nuts, seeds, or beans 5 times each week.  ? Heart-healthy fats. Healthy fats called Omega-3 fatty acids are found in foods such as flaxseeds and coldwater fish, like sardines, salmon, and mackerel.   Limit how much you eat of the following:  ? Canned or prepackaged foods.  ? Food that is high in trans fat, such as fried foods.  ? Food that is high in saturated fat, such as fatty meat.  ? Sweets, desserts, sugary drinks, and other foods with added sugar.  ? Full-fat dairy products.   Do not salt foods before eating.   Try to eat at least 2 vegetarian meals each week.   Eat more home-cooked food and less restaurant, buffet, and fast food.     When eating at a restaurant, ask that your food be prepared with less salt or no salt, if possible.  What foods are recommended?  The items listed may not be a complete list. Talk with your dietitian about what dietary choices are best for you.  Grains  Whole-grain or whole-wheat bread. Whole-grain or whole-wheat pasta. Brown rice. Oatmeal. Quinoa. Bulgur. Whole-grain and low-sodium cereals. Pita bread. Low-fat, low-sodium crackers. Whole-wheat flour tortillas.  Vegetables  Fresh or frozen vegetables (raw, steamed, roasted, or grilled). Low-sodium or reduced-sodium tomato and vegetable juice. Low-sodium or reduced-sodium tomato sauce and tomato paste. Low-sodium or reduced-sodium canned vegetables.  Fruits  All fresh, dried, or frozen fruit. Canned fruit in natural juice (without  added sugar).  Meat and other protein foods  Skinless chicken or turkey. Ground chicken or turkey. Pork with fat trimmed off. Fish and seafood. Egg whites. Dried beans, peas, or lentils. Unsalted nuts, nut butters, and seeds. Unsalted canned beans. Lean cuts of beef with fat trimmed off. Low-sodium, lean deli meat.  Dairy  Low-fat (1%) or fat-free (skim) milk. Fat-free, low-fat, or reduced-fat cheeses. Nonfat, low-sodium ricotta or cottage cheese. Low-fat or nonfat yogurt. Low-fat, low-sodium cheese.  Fats and oils  Soft margarine without trans fats. Vegetable oil. Low-fat, reduced-fat, or light mayonnaise and salad dressings (reduced-sodium). Canola, safflower, olive, soybean, and sunflower oils. Avocado.  Seasoning and other foods  Herbs. Spices. Seasoning mixes without salt. Unsalted popcorn and pretzels. Fat-free sweets.  What foods are not recommended?  The items listed may not be a complete list. Talk with your dietitian about what dietary choices are best for you.  Grains  Baked goods made with fat, such as croissants, muffins, or some breads. Dry pasta or rice meal packs.  Vegetables  Creamed or fried vegetables. Vegetables in a cheese sauce. Regular canned vegetables (not low-sodium or reduced-sodium). Regular canned tomato sauce and paste (not low-sodium or reduced-sodium). Regular tomato and vegetable juice (not low-sodium or reduced-sodium). Pickles. Olives.  Fruits  Canned fruit in a light or heavy syrup. Fried fruit. Fruit in cream or butter sauce.  Meat and other protein foods  Fatty cuts of meat. Ribs. Fried meat. Bacon. Sausage. Bologna and other processed lunch meats. Salami. Fatback. Hotdogs. Bratwurst. Salted nuts and seeds. Canned beans with added salt. Canned or smoked fish. Whole eggs or egg yolks. Chicken or turkey with skin.  Dairy  Whole or 2% milk, cream, and half-and-half. Whole or full-fat cream cheese. Whole-fat or sweetened yogurt. Full-fat cheese. Nondairy creamers. Whipped toppings.  Processed cheese and cheese spreads.  Fats and oils  Butter. Stick margarine. Lard. Shortening. Ghee. Bacon fat. Tropical oils, such as coconut, palm kernel, or palm oil.  Seasoning and other foods  Salted popcorn and pretzels. Onion salt, garlic salt, seasoned salt, table salt, and sea salt. Worcestershire sauce. Tartar sauce. Barbecue sauce. Teriyaki sauce. Soy sauce, including reduced-sodium. Steak sauce. Canned and packaged gravies. Fish sauce. Oyster sauce. Cocktail sauce. Horseradish that you find on the shelf. Ketchup. Mustard. Meat flavorings and tenderizers. Bouillon cubes. Hot sauce and Tabasco sauce. Premade or packaged marinades. Premade or packaged taco seasonings. Relishes. Regular salad dressings.  Where to find more information:   National Heart, Lung, and Blood Institute: www.nhlbi.nih.gov   American Heart Association: www.heart.org  Summary   The DASH eating plan is a healthy eating plan that has been shown to reduce high blood pressure (hypertension). It may also reduce your risk for type 2 diabetes, heart disease, and stroke.   With the   DASH eating plan, you should limit salt (sodium) intake to 2,300 mg a day. If you have hypertension, you may need to reduce your sodium intake to 1,500 mg a day.   When on the DASH eating plan, aim to eat more fresh fruits and vegetables, whole grains, lean proteins, low-fat dairy, and heart-healthy fats.   Work with your health care provider or diet and nutrition specialist (dietitian) to adjust your eating plan to your individual calorie needs.  This information is not intended to replace advice given to you by your health care provider. Make sure you discuss any questions you have with your health care provider.  Document Released: 03/16/2011 Document Revised: 03/20/2016 Document Reviewed: 03/20/2016  Elsevier Interactive Patient Education  2019 [employer] Inc[employer]

## 2018-04-29 ENCOUNTER — Encounter: Payer: Self-pay | Admitting: Internal Medicine

## 2018-04-29 NOTE — Progress Notes (Signed)
Subjective:     Patient ID: [name] [name] , female    DOB: 1940-05-05 , 78 y.o.   MRN: [id number]   Chief Complaint  Patient presents with  . Hypertension    HPI  Hypertension  This is a chronic problem. The current episode started more than 1 year ago. The problem is controlled. Pertinent negatives include no blurred vision, palpitations or shortness of breath. Risk factors for coronary artery disease include diabetes mellitus, dyslipidemia, post-menopausal state and sedentary lifestyle.     Past Medical History:  Diagnosis Date  . Diabetes mellitus ([place])   . H/O: CVA (cerebrovascular accident) 2002  . HTN (hypertension)   . Hyperlipidemia   . Pulmonary HTN ([facility])    mild by echo     Family History  Problem Relation Age of Onset  . Hypertension Mother   . Hypertension Father   . Heart Problems Father   . Hypertension Sister   . Diabetes Sister   . Hypertension Brother   . Diabetes Brother      Current Outpatient Medications:  .  amLODipine (NORVASC) 10 MG tablet, Take 10 mg by mouth daily., Disp: , Rfl:  .  aspirin 325 MG EC tablet, Take 325 mg by mouth daily., Disp: , Rfl:  .  B-D UF III MINI PEN NEEDLES 31G X 5 MM MISC, , Disp: , Rfl: 1 .  Blood Glucose Monitoring Suppl (ACCU-CHEK AVIVA) device, Check blood sugars 3 times daily E11.65, Disp: 1 each, Rfl: 0 .  glipiZIDE (GLUCOTROL XL) 2.5 MG 24 hr tablet, Take 1 tablet (2.5 mg total) by mouth daily with breakfast., Disp: 90 tablet, Rfl: 3 .  glucose blood (ACCU-CHEK AVIVA PLUS) test strip, Check blood sugars 3 times daily E11.65, Disp: 100 each, Rfl: 12 .  hydrOXYzine (ATARAX/VISTARIL) 25 MG tablet, Take 25 mg by mouth as needed for itching., Disp: , Rfl:  .  Lancets (ACCU-CHEK SOFT TOUCH) lancets, Check blood sugars 3 times daily E11.65, Disp: 100 each, Rfl: 12 .  latanoprost (XALATAN) 0.005 % ophthalmic solution, , Disp: , Rfl: 1 .  magnesium oxide (MAG-OX) 400 MG tablet, Take 400 mg by mouth daily., Disp: ,  Rfl:  .  metFORMIN (GLUCOPHAGE-XR) 750 MG 24 hr tablet, TK 1 T PO BID WF, Disp: , Rfl: 1 .  metoprolol succinate (TOPROL-XL) 25 MG 24 hr tablet, TAKE 1 TABLET BY MOUTH ONCE DAILY, Disp: 90 tablet, Rfl: 0 .  Multiple Vitamin (MULTIVITAMIN) tablet, Take 1 tablet by mouth daily., Disp: , Rfl:  .  pantoprazole (PROTONIX) 40 MG tablet, Take 40 mg by mouth daily., Disp: , Rfl:  .  potassium chloride SA (K-DUR,KLOR-CON) 20 MEQ tablet, Take 1 tablet (20 mEq total) daily by mouth., Disp: 5 tablet, Rfl: 0 .  rosuvastatin (CRESTOR) 10 MG tablet, Take 10 mg by mouth daily., Disp: , Rfl:  .  telmisartan-hydrochlorothiazide (MICARDIS HCT) 80-25 MG tablet, TK 1 T PO QD, Disp: , Rfl: 2 .  TRESIBA FLEXTOUCH 200 UNIT/ML SOPN, INJECT 30 UNITS INTO THE SKIN ONCE DAILY AT BEDTIME, Disp: , Rfl: 2 .  hydrALAZINE (APRESOLINE) 25 MG tablet, TAKE 1 TABLET(25 MG) BY MOUTH TWICE DAILY, Disp: 180 tablet, Rfl: 1   Allergies  Allergen Reactions  . Brimonidine Other (See Comments)    Eyes swell and itch  . Dorzolamide     Other reaction(s): Other (See Comments) Red, inflamed eyelid, dermatitis   . Timolol Other (See Comments)    Caused eyes to swell and itch  Review of Systems  Constitutional: Negative.   Eyes: Negative for blurred vision.  Respiratory: Negative.  Negative for shortness of breath.   Cardiovascular: Negative.  Negative for palpitations.  Gastrointestinal: Negative.   Neurological: Negative.   Psychiatric/Behavioral: Negative.      Today's Vitals   04/16/18 0957  BP: (!) 164/88  Pulse: 88  Temp: (!) 97.5 F (36.4 C)  TempSrc: Oral  Weight: 147 lb 3.2 oz (66.8 kg)  Height: 5' (1.524 m)  PainSc: 0-No pain   Body mass index is 28.75 kg/m.   Objective:  Physical Exam Vitals signs and nursing note reviewed.  Constitutional:      Appearance: Normal appearance.  HENT:     Head: Normocephalic and atraumatic.  Cardiovascular:     Rate and Rhythm: Normal rate and regular rhythm.      Heart sounds: Normal heart sounds.  Pulmonary:     Effort: Pulmonary effort is normal.     Breath sounds: Normal breath sounds.  Skin:    General: Skin is warm.  Neurological:     General: No focal deficit present.     Mental Status: She is alert.         Assessment And Plan:     1. Hypertensive nephropathy  Uncontrolled, I will start hydralazine 25mg  twice daily. She will rto in six weeks for re-evaluation. She is encouraged to avoid adding salt to her foods.   2. Chronic renal disease, stage II  Chronic. I will check GFR, Cr at her next visit.   Maximino Greenland, MD

## 2018-05-21 ENCOUNTER — Encounter: Payer: Self-pay | Admitting: Internal Medicine

## 2018-05-21 ENCOUNTER — Ambulatory Visit (INDEPENDENT_AMBULATORY_CARE_PROVIDER_SITE_OTHER): Payer: Medicare Other | Admitting: Internal Medicine

## 2018-05-21 VITALS — BP 132/80 | HR 75 | Temp 98.3°F | Ht 61.6 in | Wt 148.0 lb

## 2018-05-21 DIAGNOSIS — N182 Chronic kidney disease, stage 2 (mild): Secondary | ICD-10-CM | POA: Diagnosis not present

## 2018-05-21 DIAGNOSIS — I129 Hypertensive chronic kidney disease with stage 1 through stage 4 chronic kidney disease, or unspecified chronic kidney disease: Secondary | ICD-10-CM | POA: Diagnosis not present

## 2018-05-21 NOTE — Patient Instructions (Signed)

## 2018-05-22 LAB — BMP8+EGFR
BUN/Creatinine Ratio: 19 (ref 12–28)
BUN: 28 mg/dL — ABNORMAL HIGH (ref 8–27)
CHLORIDE: 104 mmol/L (ref 96–106)
CO2: 25 mmol/L (ref 20–29)
Calcium: 10.1 mg/dL (ref 8.7–10.3)
Creatinine, Ser: 1.47 mg/dL — ABNORMAL HIGH (ref 0.57–1.00)
GFR calc Af Amer: 39 mL/min/{1.73_m2} — ABNORMAL LOW (ref >59)
GFR calc non Af Amer: 34 mL/min/{1.73_m2} — ABNORMAL LOW (ref >59)
GLUCOSE: 132 mg/dL — AB (ref 65–99)
Potassium: 4.1 mmol/L (ref 3.5–5.2)
Sodium: 143 mmol/L (ref 134–144)

## 2018-05-23 ENCOUNTER — Other Ambulatory Visit: Payer: Self-pay | Admitting: Internal Medicine

## 2018-05-23 LAB — HM DIABETES EYE EXAM

## 2018-05-25 ENCOUNTER — Encounter: Payer: Self-pay | Admitting: Internal Medicine

## 2018-05-25 NOTE — Progress Notes (Addendum)
Subjective:     Patient ID: [name] [name] , female    DOB: [birth year] , 78 y.o.   MRN: [id number]   Chief Complaint  Patient presents with  . Hypertension    HPI  She was started on hydralazine 75m twice daily at her last visit. She is here today for bp f/u. She has not had any issues with the medication. States she feels fine.   Hypertension  This is a chronic problem. The current episode started more than 1 year ago. The problem has been gradually improving since onset. The problem is controlled. Pertinent negatives include no blurred vision, chest pain, palpitations or shortness of breath. Risk factors for coronary artery disease include diabetes mellitus, dyslipidemia, post-menopausal state and sedentary lifestyle.     Past Medical History:  Diagnosis Date  . [place]   . H/O: CVA (cerebrovascular accident) 2002  . HTN (hypertension)   . Hyperlipidemia   . Pulmonary HTN ([facility])    mild by echo     Family History  Problem Relation Age of Onset  . Hypertension Mother   . Hypertension Father   . Heart Problems Father   . Hypertension Sister   . Diabetes Sister   . Hypertension Brother   . Diabetes Brother      Current Outpatient Medications:  .  aspirin 325 MG EC tablet, Take 325 mg by mouth daily., Disp: , Rfl:  .  B-D UF III MINI PEN NEEDLES 31G X 5 MM MISC, , Disp: , Rfl: 1 .  Blood Glucose Monitoring Suppl (ACCU-CHEK AVIVA) device, Check blood sugars 3 times daily E11.65, Disp: 1 each, Rfl: 0 .  glipiZIDE (GLUCOTROL XL) 2.5 MG 24 hr tablet, Take 1 tablet (2.5 mg total) by mouth daily with breakfast., Disp: 90 tablet, Rfl: 3 .  glucose blood (ACCU-CHEK AVIVA PLUS) test strip, Check blood sugars 3 times daily E11.65, Disp: 100 each, Rfl: 12 .  hydrALAZINE (APRESOLINE) 25 MG tablet, TAKE 1 TABLET(25 MG) BY MOUTH TWICE DAILY, Disp: 180 tablet, Rfl: 1 .  Lancets (ACCU-CHEK SOFT TOUCH) lancets, Check blood sugars 3 times daily E11.65, Disp: 100 each,  Rfl: 12 .  latanoprost (XALATAN) 0.005 % ophthalmic solution, , Disp: , Rfl: 1 .  metFORMIN (GLUCOPHAGE-XR) 750 MG 24 hr tablet, TK 1 T PO BID WF, Disp: , Rfl: 1 .  metoprolol succinate (TOPROL-XL) 25 MG 24 hr tablet, TAKE 1 TABLET BY MOUTH ONCE DAILY, Disp: 90 tablet, Rfl: 0 .  Multiple Vitamin (MULTIVITAMIN) tablet, Take 1 tablet by mouth daily., Disp: , Rfl:  .  pantoprazole (PROTONIX) 40 MG tablet, Take 40 mg by mouth daily., Disp: , Rfl:  .  telmisartan-hydrochlorothiazide (MICARDIS HCT) 80-25 MG tablet, TK 1 T PO QD, Disp: , Rfl: 2 .  TRESIBA FLEXTOUCH 200 UNIT/ML SOPN, 28 Units. , Disp: , Rfl: 2 .  amLODipine (NORVASC) 10 MG tablet, TAKE 1 TABLET BY MOUTH EVERY DAY, Disp: 90 tablet, Rfl: 1 .  rosuvastatin (CRESTOR) 10 MG tablet, TAKE 1 TABLET BY MOUTH EVERY DAY, Disp: 90 tablet, Rfl: 1   Allergies  Allergen Reactions  . Brimonidine Other (See Comments)    Eyes swell and itch  . Dorzolamide     Other reaction(s): Other (See Comments) Red, inflamed eyelid, dermatitis   . Timolol Other (See Comments)    Caused eyes to swell and itch     Review of Systems  Constitutional: Negative.   Eyes: Negative for blurred vision.  Respiratory: Negative.  Negative for shortness of breath.   Cardiovascular: Negative.  Negative for chest pain and palpitations.  Gastrointestinal: Negative.   Neurological: Negative.   Psychiatric/Behavioral: Negative.      Today's Vitals   05/21/18 1027  BP: 132/80  Pulse: 75  Temp: 98.3 F (36.8 C)  TempSrc: Oral  Weight: 148 lb (67.1 kg)  Height: 5' 1.6" (1.565 m)  PainSc: 0-No pain   Body mass index is 27.42 kg/m.   Objective:  Physical Exam Vitals signs and nursing note reviewed.  Constitutional:      Appearance: Normal appearance.  HENT:     Head: Normocephalic and atraumatic.  Cardiovascular:     Rate and Rhythm: Normal rate and regular rhythm.     Heart sounds: Normal heart sounds.  Pulmonary:     Effort: Pulmonary effort is normal.      Breath sounds: Normal breath sounds.  Skin:    General: Skin is warm.  Neurological:     General: No focal deficit present.     Mental Status: She is alert.  Psychiatric:        Mood and Affect: Mood normal.         Assessment And Plan:     1. Hypertensive nephropathy  Improved. She will continue with hydralazine twice daily along with telmisartan/hctz. She will rto in six months for re-evaluation.   - BMP8+EGFR  2. Chronic renal disease, stage II  Chronic. I will check GFR, Cr today.   Maximino Greenland, MD

## 2018-05-29 ENCOUNTER — Other Ambulatory Visit: Payer: Self-pay

## 2018-06-11 ENCOUNTER — Ambulatory Visit: Payer: Medicare Other | Admitting: Internal Medicine

## 2018-06-14 ENCOUNTER — Other Ambulatory Visit: Payer: Self-pay

## 2018-06-14 ENCOUNTER — Ambulatory Visit (INDEPENDENT_AMBULATORY_CARE_PROVIDER_SITE_OTHER): Payer: Medicare Other | Admitting: Internal Medicine

## 2018-06-14 ENCOUNTER — Encounter: Payer: Self-pay | Admitting: Internal Medicine

## 2018-06-14 VITALS — BP 142/80 | HR 68 | Ht 60.0 in | Wt 149.0 lb

## 2018-06-14 DIAGNOSIS — IMO0002 Reserved for concepts with insufficient information to code with codable children: Secondary | ICD-10-CM

## 2018-06-14 DIAGNOSIS — E1165 Type 2 diabetes mellitus with hyperglycemia: Secondary | ICD-10-CM | POA: Diagnosis not present

## 2018-06-14 DIAGNOSIS — Z794 Long term (current) use of insulin: Secondary | ICD-10-CM

## 2018-06-14 DIAGNOSIS — E1159 Type 2 diabetes mellitus with other circulatory complications: Secondary | ICD-10-CM

## 2018-06-14 LAB — POCT GLYCOSYLATED HEMOGLOBIN (HGB A1C): Hemoglobin A1C: 9.6 % — AB (ref 4.0–5.6)

## 2018-06-14 MED ORDER — SEMAGLUTIDE(0.25 OR 0.5MG/DOS) 2 MG/1.5ML ~~LOC~~ SOPN: 0.5000 mg | PEN_INJECTOR | SUBCUTANEOUS | 5 refills | Status: DC

## 2018-06-14 NOTE — Patient Instructions (Addendum)
Please continue: - Metformin ER 750 mg but move this to dinnertime - Glipizide ER 2.5 mg before breakfast - Tresiba 28 units daily  Please start Ozempic 0.25 mg weekly in a.m. (for example on Sunday morning) x 4 weeks, then increase to 0.5 mg weekly in a.m. if no nausea or hypoglycemia.  Please come back for a follow-up appointment in 3 months.

## 2018-06-14 NOTE — Addendum Note (Signed)
Addended by: Cardell Peach I on: 06/14/2018 11:10 AM   Modules accepted: Orders

## 2018-06-14 NOTE — Progress Notes (Signed)
Patient ID: [name] [name], female   DOB: 1940/07/20, 78 y.o.   MRN: [id number]   HPI: [name] [name] is a 78 y.o.-year-old female, returning for follow-up for DM2, dx in the 1980s, insulin-dependent since ~2010, uncontrolled, with complications (dCHF, Cerebrovascular disease - history of CVA 2002, CKD, DR, PN).  Last visit 4 months ago.  Last hemoglobin A1c was: Lab Results  Component Value Date   HGBA1C 8.4 (A) 02/26/2018  09/10/2017: HbA1c 10.4%  Pt is on a regimen of: - Metformin ER 750 mg 2x a day >> 1x a day - Glipizide ER 2.5 mg before breakfast.  Did not use this - Tresiba 28 units daily (in am) In 02/2018: We stopped Onglyza since this was expensive and started glipizide ER low-dose. She was previously also on Levemir 35 units daily.  Pt checks her sugars once a day -per her log: - am: 57-253, (lately 69, 85-138, 175) >> 65, 79-250 (higher when eats out the night before) - 2h after b'fast: n/c >> 269 - before lunch: n/c - 2h after lunch: n/c >> 199 - before dinner: n/c - 2h after dinner: n/c - bedtime: n/c - nighttime: n/c Lowest sugar was 50s in Spring 2019  -had to go to the ED; afterwards: 57 >> 65; she has hypoglycemia awareness in the 70s.  Highest sugar was 253 >> 269.  Glucometer: Accu-Chek Aviva  Pt's meals are - eats 2x a day: - Breakfast: oatmeal + bacon - Lunch: veggies and a piece of chicken - small meal - Dinner: chicken, veggies - Snacks: apples, oranges  -+ CKD, last BUN/creatinine:  Lab Results  Component Value Date   BUN 28 (H) 05/21/2018   BUN 29 (H) 02/24/2017   CREATININE 1.47 (H) 05/21/2018   CREATININE 1.15 (H) 02/24/2017  09/10/2017: 27/1.52, GFR 37 On valsartan 320.  -+ HL; lipids: 08/14/2017: 151/187/44/70 No results found for: CHOL, HDL, LDLCALC, LDLDIRECT, TRIG, CHOLHDL  On Crestor 10.  - last eye exam was in 08/2017: + DR. + IO injections q6 weeks.  Stroke affected her left eye vision  -She denies numbness but has tingling in  her feet  On ASA 325.  Pt has FH of DM in father.  Latest TSH was reviewed and this was normal on 06/07/2016: 2.110.  ROS: Constitutional: no weight gain/no weight loss, no fatigue, no subjective hyperthermia, no subjective hypothermia Eyes: no blurry vision, no xerophthalmia ENT: no sore throat, no nodules palpated in neck, no dysphagia, no odynophagia, no hoarseness Cardiovascular: no CP/no SOB/no palpitations/no leg swelling Respiratory: no cough/no SOB/no wheezing Gastrointestinal: no N/no V/no D/no C/no acid reflux Musculoskeletal: no muscle aches/no joint aches Skin: no rashes, no hair loss Neurological: no tremors/no numbness/no tingling/no dizziness  I reviewed pt's medications, allergies, PMH, social hx, family hx, and changes were documented in the history of present illness. Otherwise, unchanged from my initial visit note.  Past Medical History:  Diagnosis Date  . Diabetes mellitus ([place])   . H/O: CVA (cerebrovascular accident) 2002  . HTN (hypertension)   . Hyperlipidemia   . Pulmonary HTN ([place])    mild by echo   Past Surgical History:  Procedure Laterality Date  . ABDOMINAL HYSTERECTOMY    . CARDIAC CATHETERIZATION  03/17/2003   Normal, patent coronary arteries. Normal LV systolic function  . CARDIOVASCULAR STRESS TEST  08/28/2006   Small sized, moderate ischemiain the Basal Inferoseptal, Basal Inferior, Mid Inferior and Apical Inferior regions  . TRANSTHORACIC ECHOCARDIOGRAM  02/17/2010   EF >55%,  moderate-severely thickened septum, mild-moderate mitral annular calcification   Social History   Socioeconomic History  . Marital status: Married    Spouse name: Not on file  . Number of children: Not on file  . Years of education: Not on file  . Highest education level: Not on file  Occupational History  . Not on file  Social Needs  . Financial resource strain: Not on file  . Food insecurity:    Worry: Not on file    Inability: Not on file  . Transportation  needs:    Medical: Not on file    Non-medical: Not on file  Tobacco Use  . Smoking status: Never Smoker  . Smokeless tobacco: Never Used  Substance and Sexual Activity  . Alcohol use: No  . Drug use: Not on file  . Sexual activity: Not on file  Lifestyle  . Physical activity:    Days per week: Not on file    Minutes per session: Not on file  . Stress: Not on file  Relationships  . Social connections:    Talks on phone: Not on file    Gets together: Not on file    Attends religious service: Not on file    Active member of club or organization: Not on file    Attends meetings of clubs or organizations: Not on file    Relationship status: Not on file  . Intimate partner violence:    Fear of current or ex partner: Not on file    Emotionally abused: Not on file    Physically abused: Not on file    Forced sexual activity: Not on file  Other Topics Concern  . Not on file  Social History Narrative  . Not on file   Current Outpatient Medications on File Prior to Visit  Medication Sig Dispense Refill  . amLODipine (NORVASC) 10 MG tablet TAKE 1 TABLET BY MOUTH EVERY DAY 90 tablet 1  . aspirin 325 MG EC tablet Take 325 mg by mouth daily.    . B-D UF III MINI PEN NEEDLES 31G X 5 MM MISC   1  . Blood Glucose Monitoring Suppl (ACCU-CHEK AVIVA) device Check blood sugars 3 times daily E11.65 1 each 0  . glipiZIDE (GLUCOTROL XL) 2.5 MG 24 hr tablet Take 1 tablet (2.5 mg total) by mouth daily with breakfast. 90 tablet 3  . glucose blood (ACCU-CHEK AVIVA PLUS) test strip Check blood sugars 3 times daily E11.65 100 each 12  . hydrALAZINE (APRESOLINE) 25 MG tablet TAKE 1 TABLET(25 MG) BY MOUTH TWICE DAILY 180 tablet 1  . Lancets (ACCU-CHEK SOFT TOUCH) lancets Check blood sugars 3 times daily E11.65 100 each 12  . latanoprost (XALATAN) 0.005 % ophthalmic solution   1  . metFORMIN (GLUCOPHAGE-XR) 750 MG 24 hr tablet 750 mg. 1 tablet daily  1  . metoprolol succinate (TOPROL-XL) 25 MG 24 hr tablet  TAKE 1 TABLET BY MOUTH ONCE DAILY 90 tablet 0  . Multiple Vitamin (MULTIVITAMIN) tablet Take 1 tablet by mouth daily.    . pantoprazole (PROTONIX) 40 MG tablet Take 40 mg by mouth daily.    . rosuvastatin (CRESTOR) 10 MG tablet TAKE 1 TABLET BY MOUTH EVERY DAY 90 tablet 1  . telmisartan-hydrochlorothiazide (MICARDIS HCT) 80-25 MG tablet TK 1 T PO QD  2  . TRESIBA FLEXTOUCH 200 UNIT/ML SOPN 28 Units.   2   No current facility-administered medications on file prior to visit.    Allergies  Allergen Reactions  .  Brimonidine Other (See Comments)    Eyes swell and itch  . Dorzolamide     Other reaction(s): Other (See Comments) Red, inflamed eyelid, dermatitis   . Timolol Other (See Comments)    Caused eyes to swell and itch   Family History  Problem Relation Age of Onset  . Hypertension Mother   . Hypertension Father   . Heart Problems Father   . Hypertension Sister   . Diabetes Sister   . Hypertension Brother   . Diabetes Brother     PE: BP (!) 142/80   Pulse 68   Ht 5' (1.524 m)   Wt 149 lb (67.6 kg)   SpO2 96%   BMI 29.10 kg/m   Wt Readings from Last 3 Encounters:  06/14/18 149 lb (67.6 kg)  05/21/18 148 lb (67.1 kg)  04/16/18 147 lb 3.2 oz (66.8 kg)   Constitutional: Slightly overweight, in NAD Eyes: PERRLA, EOMI, no exophthalmos ENT: moist mucous membranes, no thyromegaly, no cervical lymphadenopathy Cardiovascular: RRR, No MRG Respiratory: CTA B Gastrointestinal: abdomen soft, NT, ND, BS+ Musculoskeletal: no deformities, strength intact in all 4 Skin: moist, warm, no rashes Neurological: no tremor with outstretched hands, DTR normal in all 4  ASSESSMENT: 1. DM2, insulin-dependent, uncontrolled, with long-term complications - dCHF - Cerebrovascular disease, history of CVA 2002 - CKD - DR - PN  PLAN:  1. Patient with longstanding, uncontrolled, type 2 diabetes, on oral antidiabetic regimen + basal insulin, with improved control at last visit (HbA1c was  8.4%).  She started to improve her diet in the 2 weeks prior to our last appointment and sugars were significantly better.  At that time, she was only checking sugars in the morning and I strongly advised her to start taking later in the day, also, rotating check times.  At last visit, she could not afford Onglyza so we added glipizide XL low dose.   -She does have a history of CHF so ideally, she would be on an SGLT2 inhibitor, but her GFR is in the 30s so we cannot use this.  Another option is a GLP-1 receptor agonist, but it is not clear whether this is affordable. -At this visit, sugars are higher, and more fluctuating in the morning.  She is not usually checking sugars later in the day.  I strongly advised her to start doing so.  Also, she is not writing down comments about the different sugars and I again discussed with her that it would be very important to know what causes the fluctuations in her sugars.  She thinks that maybe sugars are higher in the morning when she eats out the night before.  She does not feel that missing insulin doses could be to blame for the high blood sugars in the morning. -At this visit, I suggested to start the GLP-1 receptor agonist and explained benefits and possible side effects.  We will start at a low dose and advance as tolerated.  I hope her insurance covers Ozempic.  If not, we may need to increase her glipizide in the morning, however, I would really prefer not to have to do this due to possible low blood sugars. - I suggested to:  Patient Instructions  Please continue: - Metformin ER 750 mg but move this to dinnertime - Glipizide ER 2.5 mg before breakfast - Tresiba 28 units daily  Please start Ozempic 0.25 mg weekly in a.m. (for example on Sunday morning) x 4 weeks, then increase to 0.5 mg weekly in a.m.  if no nausea or hypoglycemia.  Please come back for a follow-up appointment in 3 months.  - today, HbA1c is 9.6% (higher) - continue checking sugars at  different times of the day - check 1x a day, rotating checks - advised for yearly eye exams >> she is UTD - Return to clinic in 3 mo with sugar log   2. HL - Reviewed lipid panel from 08/2017: LDL lower than 100, triglycerides slightly high - Continues Crestor without side effects.   Philemon Kingdom, MD PhD [facility] Endocrinology

## 2018-06-17 ENCOUNTER — Other Ambulatory Visit: Payer: Self-pay | Admitting: Internal Medicine

## 2018-06-18 ENCOUNTER — Telehealth: Payer: Self-pay

## 2018-06-18 NOTE — Telephone Encounter (Signed)
Yes, let us send the PA

## 2018-06-18 NOTE — Telephone Encounter (Signed)
The patient called and left a message that she wanted to cancel her appt's for tomorrow and that she will call back to reschedule them.

## 2018-06-18 NOTE — Telephone Encounter (Signed)
Ozempic is not covered by insurance, please advise if you would like to attempt a PA.

## 2018-06-21 NOTE — Telephone Encounter (Signed)
PA in process

## 2018-06-21 NOTE — Telephone Encounter (Signed)
PA was cancel, it is covered even though they said it wasn't and had [place] waste our time doing a PA.

## 2018-07-20 ENCOUNTER — Other Ambulatory Visit: Payer: Self-pay | Admitting: Internal Medicine

## 2018-07-20 DIAGNOSIS — N182 Chronic kidney disease, stage 2 (mild): Secondary | ICD-10-CM

## 2018-07-20 DIAGNOSIS — E1122 Type 2 diabetes mellitus with diabetic chronic kidney disease: Secondary | ICD-10-CM

## 2018-07-22 ENCOUNTER — Other Ambulatory Visit: Payer: Self-pay | Admitting: Internal Medicine

## 2018-07-22 DIAGNOSIS — E1122 Type 2 diabetes mellitus with diabetic chronic kidney disease: Secondary | ICD-10-CM

## 2018-07-22 DIAGNOSIS — N182 Chronic kidney disease, stage 2 (mild): Secondary | ICD-10-CM

## 2018-08-13 ENCOUNTER — Ambulatory Visit (INDEPENDENT_AMBULATORY_CARE_PROVIDER_SITE_OTHER): Payer: Medicare Other

## 2018-08-13 ENCOUNTER — Encounter: Payer: Self-pay | Admitting: Internal Medicine

## 2018-08-13 ENCOUNTER — Ambulatory Visit (INDEPENDENT_AMBULATORY_CARE_PROVIDER_SITE_OTHER): Payer: Medicare Other | Admitting: Internal Medicine

## 2018-08-13 ENCOUNTER — Other Ambulatory Visit: Payer: Self-pay

## 2018-08-13 VITALS — BP 143/73 | HR 66 | Ht 62.0 in | Wt 148.0 lb

## 2018-08-13 VITALS — BP 143/73 | HR 66 | Ht 60.0 in | Wt 148.0 lb

## 2018-08-13 DIAGNOSIS — I129 Hypertensive chronic kidney disease with stage 1 through stage 4 chronic kidney disease, or unspecified chronic kidney disease: Secondary | ICD-10-CM

## 2018-08-13 DIAGNOSIS — E7800 Pure hypercholesterolemia, unspecified: Secondary | ICD-10-CM | POA: Diagnosis not present

## 2018-08-13 DIAGNOSIS — Z0000 Encounter for general adult medical examination without abnormal findings: Secondary | ICD-10-CM | POA: Diagnosis not present

## 2018-08-13 DIAGNOSIS — N182 Chronic kidney disease, stage 2 (mild): Secondary | ICD-10-CM | POA: Diagnosis not present

## 2018-08-13 DIAGNOSIS — K219 Gastro-esophageal reflux disease without esophagitis: Secondary | ICD-10-CM

## 2018-08-13 DIAGNOSIS — Z23 Encounter for immunization: Secondary | ICD-10-CM | POA: Diagnosis not present

## 2018-08-13 MED ORDER — HYDRALAZINE HCL 25 MG PO TABS: ORAL_TABLET | ORAL | 1 refills | Status: DC

## 2018-08-13 MED ORDER — PNEUMOCOCCAL 13-VAL CONJ VACC IM SUSP: 0.5000 mL | INTRAMUSCULAR | 0 refills | Status: AC

## 2018-08-13 NOTE — Patient Instructions (Signed)

## 2018-08-13 NOTE — Progress Notes (Signed)
Subjective:   [name] [name] is a 78 y.o. female who presents for Medicare Annual (Subsequent) preventive examination.  This visit type was conducted due to national recommendations for restrictions regarding the COVID-19 Pandemic (e.g. social distancing). This format is felt to be most appropriate for this patient at this time. All issues noted in this document were discussed and addressed. No physical exam was performed (except for noted visual exam findings with Video Visits). This patient, [name] [name], has given permission to perform this visit via telephone. Vital signs may be absent or patient reported.  Patient location: at home  Nurse location:  [place] office     Review of Systems:  n/a Cardiac Risk Factors include: advanced age (>55men, >46 women);diabetes mellitus;dyslipidemia;hypertension     Objective:     Vitals: BP (!) 143/73 Comment: per patient  Pulse 66 Comment: per patient  Ht 5\' 2"  (1.575 m) Comment: per patient  Wt 148 lb (67.1 kg) Comment: per patient  BMI 27.07 kg/m   Body mass index is 27.07 kg/m.  Advanced Directives 08/13/2018 07/02/2015 06/29/2015 05/12/2015  Does Patient Have a Medical Advance Directive? Yes No No No  Type of Paramedic of [place];Living will - - -  Copy of [place] in Chart? No - copy requested - - -  Would patient like information on creating a medical advance directive? - No - patient declined information No - patient declined information No - patient declined information    Tobacco Social History   Tobacco Use  Smoking Status Never Smoker  Smokeless Tobacco Never Used     Counseling given: Not Answered   Clinical Intake:  Pre-visit preparation completed: Yes  Pain : No/denies pain     Nutritional Status: BMI 25 -29 Overweight Nutritional Risks: None Diabetes: Yes CBG done?: No Did pt. bring in CBG monitor from home?: No  How often do you need to have someone  help you when you read instructions, pamphlets, or other written materials from your doctor or pharmacy?: 1 - Never What is the last grade level you completed in school?: 10th grade  Interpreter Needed?: No  Information entered by :: NAllen LPN  Past Medical History:  Diagnosis Date  . Diabetes mellitus ([place])   . H/O: CVA (cerebrovascular accident) 2002  . HTN (hypertension)   . Hyperlipidemia   . Pulmonary HTN ([place])    mild by echo   Past Surgical History:  Procedure Laterality Date  . ABDOMINAL HYSTERECTOMY    . CARDIAC CATHETERIZATION  03/17/2003   Normal, patent coronary arteries. Normal LV systolic function  . CARDIOVASCULAR STRESS TEST  08/28/2006   Small sized, moderate ischemiain the Basal Inferoseptal, Basal Inferior, Mid Inferior and Apical Inferior regions  . TRANSTHORACIC ECHOCARDIOGRAM  02/17/2010   EF >55%, moderate-severely thickened septum, mild-moderate mitral annular calcification   Family History  Problem Relation Age of Onset  . Hypertension Mother   . Hypertension Father   . Heart Problems Father   . Hypertension Sister   . Diabetes Sister   . Hypertension Brother   . Diabetes Brother    Social History   Socioeconomic History  . Marital status: Married    Spouse name: Not on file  . Number of children: Not on file  . Years of education: Not on file  . Highest education level: Not on file  Occupational History  . Occupation: retired  Scientific laboratory technician  . Financial resource strain: Not hard at all  .  Food insecurity:    Worry: Never true    Inability: Never true  . Transportation needs:    Medical: No    Non-medical: No  Tobacco Use  . Smoking status: Never Smoker  . Smokeless tobacco: Never Used  Substance and Sexual Activity  . Alcohol use: No  . Drug use: Never  . Sexual activity: Not Currently  Lifestyle  . Physical activity:    Days per week: 7 days    Minutes per session: 10 min  . Stress: Not at all  Relationships  . Social  connections:    Talks on phone: Not on file    Gets together: Not on file    Attends religious service: Not on file    Active member of club or organization: Not on file    Attends meetings of clubs or organizations: Not on file    Relationship status: Not on file  Other Topics Concern  . Not on file  Social History Narrative  . Not on file    Outpatient Encounter Medications as of 08/13/2018  Medication Sig  . ACCU-CHEK AVIVA PLUS test strip TEST 3 TIMES DAILY  . amLODipine (NORVASC) 10 MG tablet TAKE 1 TABLET BY MOUTH ONCE DAILY  . aspirin 325 MG EC tablet Take 325 mg by mouth daily.  . B-D UF III MINI PEN NEEDLES 31G X 5 MM MISC   . Blood Glucose Monitoring Suppl (ACCU-CHEK AVIVA) device Check blood sugars 3 times daily E11.65  . glipiZIDE (GLUCOTROL XL) 2.5 MG 24 hr tablet Take 1 tablet (2.5 mg total) by mouth daily with breakfast.  . hydrALAZINE (APRESOLINE) 25 MG tablet TAKE 1 TABLET(25 MG) BY MOUTH TWICE DAILY  . Lancets (ACCU-CHEK SOFT TOUCH) lancets Check blood sugars 3 times daily E11.65  . latanoprost (XALATAN) 0.005 % ophthalmic solution   . metFORMIN (GLUCOPHAGE-XR) 750 MG 24 hr tablet 750 mg. 1 tablet daily  . metoprolol succinate (TOPROL-XL) 25 MG 24 hr tablet TAKE 1 TABLET BY MOUTH ONCE DAILY  . Multiple Vitamin (MULTIVITAMIN) tablet Take 1 tablet by mouth daily.  . pantoprazole (PROTONIX) 40 MG tablet Take 40 mg by mouth daily.  . pneumococcal 13-valent conjugate vaccine (PREVNAR 13) SUSP injection Inject 0.5 mLs into the muscle tomorrow at 10 am for 1 dose.  . rosuvastatin (CRESTOR) 10 MG tablet TAKE 1 TABLET BY MOUTH EVERY DAY  . Semaglutide,0.25 or 0.5MG /DOS, (OZEMPIC, 0.25 OR 0.5 MG/DOSE,) 2 MG/1.5ML SOPN Inject 0.5 mg into the skin once a week.  . telmisartan-hydrochlorothiazide (MICARDIS HCT) 80-25 MG tablet TAKE 1 TABLET BY MOUTH EVERY DAY  . TRESIBA FLEXTOUCH 200 UNIT/ML SOPN 28 Units.    No facility-administered encounter medications on file as of 08/13/2018.      Activities of Daily Living In your present state of health, do you have any difficulty performing the following activities: 08/13/2018  Hearing? N  Vision? Y  Comment left eye difficulty  Difficulty concentrating or making decisions? N  Walking or climbing stairs? N  Dressing or bathing? N  Doing errands, shopping? N  Preparing Food and eating ? N  Using the Toilet? N  In the past six months, have you accidently leaked urine? N  Do you have problems with loss of bowel control? N  Managing your Medications? N  Managing your Finances? N  Housekeeping or managing your Housekeeping? N  Some recent data might be hidden    Patient Care Team: Glendale Chard, MD as PCP - General (Internal Medicine)  Assessment:   This is a routine wellness examination for [place].  Exercise Activities and Dietary recommendations Current Exercise Habits: Home exercise routine, Type of exercise: walking, Time (Minutes): 15, Frequency (Times/Week): 7, Weekly Exercise (Minutes/Week): 105  Goals    . Patient Stated     No goals       Fall Risk Fall Risk  08/13/2018 08/13/2018 05/21/2018 04/16/2018  Falls in the past year? 0 0 0 0  Risk for fall due to : Medication side effect - - -  Follow up Falls prevention discussed - - -   Is the patient's home free of loose throw rugs in walkways, pet beds, electrical cords, etc?   yes      Grab bars in the bathroom? no      Handrails on the stairs?   n/a      Adequate lighting?   yes  Timed Get Up and Go performed: n/a  Depression Screen PHQ 2/9 Scores 08/13/2018 08/13/2018 05/21/2018 04/16/2018  PHQ - 2 Score 0 0 0 0  PHQ- 9 Score 0 - - -     Cognitive Function     6CIT Screen 08/13/2018  What Year? 0 points  What month? 0 points  What time? 0 points  Count back from 20 0 points  Months in reverse 0 points  Repeat phrase 0 points  Total Score 0    Immunization History  Administered Date(s) Administered  . Influenza-Unspecified 04/10/2017  .  Pneumococcal-Unspecified 03/31/2014  . Zoster 12/31/2007    Qualifies for Shingles Vaccine? yes  Screening Tests Health Maintenance  Topic Date Due  . FOOT EXAM  07/05/1948  . OPHTHALMOLOGY EXAM  07/05/1948  . TETANUS/TDAP  07/05/1957  . PNA vac Low Risk Adult (2 of 2 - PCV13) 04/01/2015  . INFLUENZA VACCINE  11/09/2018  . HEMOGLOBIN A1C  12/15/2018  . DEXA SCAN  Completed    Cancer Screenings: Lung: Low Dose CT Chest recommended if Age 31-80 years, 30 pack-year currently smoking OR have quit w/in 15years. Patient does not qualify. Breast:  Up to date on Mammogram? Yes   Up to date of Bone Density/Dexa? Yes Colorectal: not required  Additional Screenings: : Hepatitis C Screening: n/a     Plan:   No goals at this time  I have personally reviewed and noted the following in the patient's chart:   . Medical and social history . Use of alcohol, tobacco or illicit drugs  . Current medications and supplements . Functional ability and status . Nutritional status . Physical activity . Advanced directives . List of other physicians . Hospitalizations, surgeries, and ER visits in previous 12 months . Vitals . Screenings to include cognitive, depression, and falls . Referrals and appointments  In addition, I have reviewed and discussed with patient certain preventive protocols, quality metrics, and best practice recommendations. A written personalized care plan for preventive services as well as general preventive health recommendations were provided to patient.     Kellie Simmering, LPN  [id number]

## 2018-08-13 NOTE — Patient Instructions (Signed)
[name] [name] , Thank you for taking time to come for your Medicare Wellness Visit. I appreciate your ongoing commitment to your health goals. Please review the following plan we discussed and let me know if I can assist you in the future.   Screening recommendations/referrals: Colonoscopy: not required Mammogram: 07/2017 Bone Density: 09/2016 Recommended yearly ophthalmology/optometry visit for glaucoma screening and checkup Recommended yearly dental visit for hygiene and checkup  Vaccinations: Influenza vaccine: 12/2017 Pneumococcal vaccine: 03/2014 Tdap vaccine: due Shingles vaccine: discussed    Advanced directives: Please bring a copy of your POA (Power of [place]) and/or Living Will to your next appointment.    Conditions/risks identified: Overweight  Next appointment: 08/13/2018   Preventive Care 48 Years and Older, Female Preventive care refers to lifestyle choices and visits with your health care provider that can promote health and wellness. What does preventive care include?  A yearly physical exam. This is also called an annual well check.  Dental exams once or twice a year.  Routine eye exams. Ask your health care provider how often you should have your eyes checked.  Personal lifestyle choices, including:  Daily care of your teeth and gums.  Regular physical activity.  Eating a healthy diet.  Avoiding tobacco and drug use.  Limiting alcohol use.  Practicing safe sex.  Taking low-dose aspirin every day.  Taking vitamin and mineral supplements as recommended by your health care provider. What happens during an annual well check? The services and screenings done by your health care provider during your annual well check will depend on your age, overall health, lifestyle risk factors, and family history of disease. Counseling  Your health care provider may ask you questions about your:  Alcohol use.  Tobacco use.  Drug use.  Emotional well-being.   Home and relationship well-being.  Sexual activity.  Eating habits.  History of falls.  Memory and ability to understand (cognition).  Work and work Statistician.  Reproductive health. Screening  You may have the following tests or measurements:  Height, weight, and BMI.  Blood pressure.  Lipid and cholesterol levels. These may be checked every 5 years, or more frequently if you are over 106 years old.  Skin check.  Lung cancer screening. You may have this screening every year starting at age 19 if you have a 30-pack-year history of smoking and currently smoke or have quit within the past 15 years.  Fecal occult blood test (FOBT) of the stool. You may have this test every year starting at age 36.  Flexible sigmoidoscopy or colonoscopy. You may have a sigmoidoscopy every 5 years or a colonoscopy every 10 years starting at age 37.  Hepatitis C blood test.  Hepatitis B blood test.  Sexually transmitted disease (STD) testing.  Diabetes screening. This is done by checking your blood sugar (glucose) after you have not eaten for a while (fasting). You may have this done every 1-3 years.  Bone density scan. This is done to screen for osteoporosis. You may have this done starting at age 49.  Mammogram. This may be done every 1-2 years. Talk to your health care provider about how often you should have regular mammograms. Talk with your health care provider about your test results, treatment options, and if necessary, the need for more tests. Vaccines  Your health care provider may recommend certain vaccines, such as:  Influenza vaccine. This is recommended every year.  Tetanus, diphtheria, and acellular pertussis (Tdap, Td) vaccine. You may need a Td booster every  10 years.  Zoster vaccine. You may need this after age 70.  Pneumococcal 13-valent conjugate (PCV13) vaccine. One dose is recommended after age 67.  Pneumococcal polysaccharide (PPSV23) vaccine. One dose is  recommended after age 6. Talk to your health care provider about which screenings and vaccines you need and how often you need them. This information is not intended to replace advice given to you by your health care provider. Make sure you discuss any questions you have with your health care provider. Document Released: 04/23/2015 Document Revised: 12/15/2015 Document Reviewed: 01/26/2015 Elsevier Interactive Patient Education  2017 [place] Prevention in the Home Falls can cause injuries. They can happen to people of all ages. There are many things you can do to make your home safe and to help prevent falls. What can I do on the outside of my home?  Regularly fix the edges of walkways and driveways and fix any cracks.  Remove anything that might make you trip as you walk through a door, such as a raised step or threshold.  Trim any bushes or trees on the path to your home.  Use bright outdoor lighting.  Clear any walking paths of anything that might make someone trip, such as rocks or tools.  Regularly check to see if handrails are loose or broken. Make sure that both sides of any steps have handrails.  Any raised decks and porches should have guardrails on the edges.  Have any leaves, snow, or ice cleared regularly.  Use sand or salt on walking paths during winter.  Clean up any spills in your garage right away. This includes oil or grease spills. What can I do in the bathroom?  Use night lights.  Install grab bars by the toilet and in the tub and shower. Do not use towel bars as grab bars.  Use non-skid mats or decals in the tub or shower.  If you need to sit down in the shower, use a plastic, non-slip stool.  Keep the floor dry. Clean up any water that spills on the floor as soon as it happens.  Remove soap buildup in the tub or shower regularly.  Attach bath mats securely with double-sided non-slip rug tape.  Do not have throw rugs and other things on  the floor that can make you trip. What can I do in the bedroom?  Use night lights.  Make sure that you have a light by your bed that is easy to reach.  Do not use any sheets or blankets that are too big for your bed. They should not hang down onto the floor.  Have a firm chair that has side arms. You can use this for support while you get dressed.  Do not have throw rugs and other things on the floor that can make you trip. What can I do in the kitchen?  Clean up any spills right away.  Avoid walking on wet floors.  Keep items that you use a lot in easy-to-reach places.  If you need to reach something above you, use a strong step stool that has a grab bar.  Keep electrical cords out of the way.  Do not use floor polish or wax that makes floors slippery. If you must use wax, use non-skid floor wax.  Do not have throw rugs and other things on the floor that can make you trip. What can I do with my stairs?  Do not leave any items on the stairs.  Make sure  that there are handrails on both sides of the stairs and use them. Fix handrails that are broken or loose. Make sure that handrails are as long as the stairways.  Check any carpeting to make sure that it is firmly attached to the stairs. Fix any carpet that is loose or worn.  Avoid having throw rugs at the top or bottom of the stairs. If you do have throw rugs, attach them to the floor with carpet tape.  Make sure that you have a light switch at the top of the stairs and the bottom of the stairs. If you do not have them, ask someone to add them for you. What else can I do to help prevent falls?  Wear shoes that:  Do not have high heels.  Have rubber bottoms.  Are comfortable and fit you well.  Are closed at the toe. Do not wear sandals.  If you use a stepladder:  Make sure that it is fully opened. Do not climb a closed stepladder.  Make sure that both sides of the stepladder are locked into place.  Ask someone to  hold it for you, if possible.  Clearly mark and make sure that you can see:  Any grab bars or handrails.  First and last steps.  Where the edge of each step is.  Use tools that help you move around (mobility aids) if they are needed. These include:  Canes.  Walkers.  Scooters.  Crutches.  Turn on the lights when you go into a dark area. Replace any light bulbs as soon as they burn out.  Set up your furniture so you have a clear path. Avoid moving your furniture around.  If any of your floors are uneven, fix them.  If there are any pets around you, be aware of where they are.  Review your medicines with your doctor. Some medicines can make you feel dizzy. This can increase your chance of falling. Ask your doctor what other things that you can do to help prevent falls. This information is not intended to replace advice given to you by your health care provider. Make sure you discuss any questions you have with your health care provider. Document Released: 01/21/2009 Document Revised: 09/02/2015 Document Reviewed: 05/01/2014 Elsevier Interactive Patient Education  2017 [employer].

## 2018-08-18 NOTE — Progress Notes (Signed)
Virtual Visit via Phone   This visit type was conducted due to national recommendations for restrictions regarding the COVID-19 Pandemic (e.g. social distancing) in an effort to limit this patient's exposure and mitigate transmission in our community.  Due to her co-morbid illnesses, this patient is at least at moderate risk for complications without adequate follow up.  This format is felt to be most appropriate for this patient at this time.  All issues noted in this document were discussed and addressed.  A limited physical exam was performed with this format.    This visit type was conducted due to national recommendations for restrictions regarding the COVID-19 Pandemic (e.g. social distancing) in an effort to limit this patient's exposure and mitigate transmission in our community.  Patients identity confirmed using two different identifiers.  This format is felt to be most appropriate for this patient at this time.  All issues noted in this document were discussed and addressed.  No physical exam was performed (except for noted visual exam findings with Video Visits).    Date:  08/18/2018   ID:  [name] [name], [name] [name] 07-14-1940, MRN [id number]  Patient Location:  Home  Provider location:   Office  Chief Complaint:  Blood pressure f/u   History of Present Illness:    [name] [name] is a 78 y.o. female who presents via video conferencing for a telehealth visit today.    The patient does not have symptoms concerning for COVID-19 infection (fever, chills, cough, or new shortness of breath).   She presents today for virtual visit. She prefers this method of contact due to COVID-19 pandemic. She reports she does not have the technology for a video visit. She feels well. She reports she has been following the stay at home orders.   Hypertension  This is a chronic problem. The current episode started more than 1 year ago. The problem has been gradually improving since onset. Pertinent  negatives include no blurred vision, chest pain, palpitations or shortness of breath. Risk factors for coronary artery disease include dyslipidemia, diabetes mellitus, sedentary lifestyle and post-menopausal state. Compliance problems include exercise.      Past Medical History:  Diagnosis Date  . Diabetes mellitus ([place])   . H/O: CVA (cerebrovascular accident) 2002  . HTN (hypertension)   . Hyperlipidemia   . Pulmonary HTN ([place])    mild by echo   Past Surgical History:  Procedure Laterality Date  . ABDOMINAL HYSTERECTOMY    . CARDIAC CATHETERIZATION  03/17/2003   Normal, patent coronary arteries. Normal LV systolic function  . CARDIOVASCULAR STRESS TEST  08/28/2006   Small sized, moderate ischemiain the Basal Inferoseptal, Basal Inferior, Mid Inferior and Apical Inferior regions  . TRANSTHORACIC ECHOCARDIOGRAM  02/17/2010   EF >55%, moderate-severely thickened septum, mild-moderate mitral annular calcification     Current Meds  Medication Sig  . ACCU-CHEK AVIVA PLUS test strip TEST 3 TIMES DAILY  . amLODipine (NORVASC) 10 MG tablet TAKE 1 TABLET BY MOUTH ONCE DAILY  . aspirin 325 MG EC tablet Take 325 mg by mouth daily.  . B-D UF III MINI PEN NEEDLES 31G X 5 MM MISC   . Blood Glucose Monitoring Suppl (ACCU-CHEK AVIVA) device Check blood sugars 3 times daily E11.65  . glipiZIDE (GLUCOTROL XL) 2.5 MG 24 hr tablet Take 1 tablet (2.5 mg total) by mouth daily with breakfast.  . hydrALAZINE (APRESOLINE) 25 MG tablet TAKE 1 TABLET(25 MG) BY MOUTH TWICE DAILY  . Lancets (ACCU-CHEK SOFT TOUCH)  lancets Check blood sugars 3 times daily E11.65  . latanoprost (XALATAN) 0.005 % ophthalmic solution   . metFORMIN (GLUCOPHAGE-XR) 750 MG 24 hr tablet 750 mg. 1 tablet daily  . metoprolol succinate (TOPROL-XL) 25 MG 24 hr tablet TAKE 1 TABLET BY MOUTH ONCE DAILY  . Multiple Vitamin (MULTIVITAMIN) tablet Take 1 tablet by mouth daily.  . rosuvastatin (CRESTOR) 10 MG tablet TAKE 1 TABLET BY MOUTH EVERY  DAY  . Semaglutide,0.25 or 0.5MG /DOS, (OZEMPIC, 0.25 OR 0.5 MG/DOSE,) 2 MG/1.5ML SOPN Inject 0.5 mg into the skin once a week.  . telmisartan-hydrochlorothiazide (MICARDIS HCT) 80-25 MG tablet TAKE 1 TABLET BY MOUTH EVERY DAY  . TRESIBA FLEXTOUCH 200 UNIT/ML SOPN 28 Units.   . [DISCONTINUED] hydrALAZINE (APRESOLINE) 25 MG tablet TAKE 1 TABLET(25 MG) BY MOUTH TWICE DAILY  . [DISCONTINUED] pantoprazole (PROTONIX) 40 MG tablet Take 40 mg by mouth daily.     Allergies:   Brimonidine; Dorzolamide; and Timolol   Social History   Tobacco Use  . Smoking status: Never Smoker  . Smokeless tobacco: Never Used  Substance Use Topics  . Alcohol use: No  . Drug use: Never     Family Hx: The patient's family history includes Diabetes in her brother and sister; Heart Problems in her father; Hypertension in her brother, father, mother, and sister.  ROS:   Please see the history of present illness.    Review of Systems  Constitutional: Negative.   Eyes: Negative for blurred vision.  Respiratory: Negative.  Negative for shortness of breath.   Cardiovascular: Negative.  Negative for chest pain and palpitations.  Gastrointestinal: Negative.   Neurological: Negative.   Psychiatric/Behavioral: Negative.     All other systems reviewed and are negative.   Labs/Other Tests and Data Reviewed:    Recent Labs: 05/21/2018: BUN 28; Creatinine, Ser 1.47; Potassium 4.1; Sodium 143   Recent Lipid Panel No results found for: CHOL, TRIG, HDL, CHOLHDL, LDLCALC, LDLDIRECT  Wt Readings from Last 3 Encounters:  08/13/18 148 lb (67.1 kg)  08/13/18 148 lb (67.1 kg)  06/14/18 149 lb (67.6 kg)     Exam:    Vital Signs:  BP (!) 143/73 (BP Location: Left Arm, Patient Position: Sitting, Cuff Size: Normal) Comment: pt provided  Pulse 66 Comment: pt provided  Ht 5' (1.524 m)   Wt 148 lb (67.1 kg) Comment: pt provided  BMI 28.90 kg/m    PE not performed, phone visit.  She speaks fluently. She does not  appear to be SOB.    ASSESSMENT & PLAN:     1. Hypertensive nephropathy  Fair control. She will continue with current meds. She is encouraged to incorporate more exercise into her daily routine. She will continue with current meds.   2. Chronic renal disease, stage II  Chronic. She is encouraged to stay well hydrated.   3. Pure hypercholesterolemia  She will continue with current meds. She is encouraged to avoid fried foods and to incorporate more exercise into her daily routine. She is also encouraged to increase her fiber intake.   4. Gastroesophageal reflux disease without esophagitis  Chronic. She admits that she does not take her meds daily. She will take prn. Risks associated with long-term PPI use was discussed with the patient in full detail.      COVID-19 Education: The signs and symptoms of COVID-19 were discussed with the patient and how to seek care for testing (follow up with PCP or arrange E-visit).  The importance of social distancing was  discussed today.  Patient Risk:   After full review of this patients clinical status, I feel that they are at least moderate risk at this time.  Time:   Today, I have spent 14 minutes/ seconds with the patient with telehealth technology discussing above diagnoses.     Medication Adjustments/Labs and Tests Ordered: Current medicines are reviewed at length with the patient today.  Concerns regarding medicines are outlined above.   Tests Ordered: No orders of the defined types were placed in this encounter.   Medication Changes: Meds ordered this encounter  Medications  . hydrALAZINE (APRESOLINE) 25 MG tablet    Sig: TAKE 1 TABLET(25 MG) BY MOUTH TWICE DAILY    Dispense:  180 tablet    Refill:  1    **Patient requests 90 days supply**    Disposition:  Follow up in 6 month(s)  Signed, Maximino Greenland, MD

## 2018-08-30 ENCOUNTER — Other Ambulatory Visit: Payer: Self-pay | Admitting: Internal Medicine

## 2018-08-30 DIAGNOSIS — Z1231 Encounter for screening mammogram for malignant neoplasm of breast: Secondary | ICD-10-CM

## 2018-09-11 ENCOUNTER — Ambulatory Visit: Payer: Medicare Other

## 2018-09-11 ENCOUNTER — Ambulatory Visit: Payer: Medicare Other | Admitting: Internal Medicine

## 2018-09-15 ENCOUNTER — Other Ambulatory Visit: Payer: Self-pay | Admitting: Internal Medicine

## 2018-10-07 ENCOUNTER — Other Ambulatory Visit: Payer: Self-pay

## 2018-10-09 ENCOUNTER — Encounter: Payer: Self-pay | Admitting: Internal Medicine

## 2018-10-09 ENCOUNTER — Ambulatory Visit: Payer: Medicare Other | Admitting: Internal Medicine

## 2018-10-09 ENCOUNTER — Other Ambulatory Visit: Payer: Self-pay

## 2018-10-09 VITALS — BP 126/70 | HR 90 | Ht 61.75 in | Wt 148.0 lb

## 2018-10-09 DIAGNOSIS — E663 Overweight: Secondary | ICD-10-CM | POA: Diagnosis not present

## 2018-10-09 DIAGNOSIS — Z794 Long term (current) use of insulin: Secondary | ICD-10-CM

## 2018-10-09 DIAGNOSIS — E785 Hyperlipidemia, unspecified: Secondary | ICD-10-CM | POA: Diagnosis not present

## 2018-10-09 DIAGNOSIS — E1159 Type 2 diabetes mellitus with other circulatory complications: Secondary | ICD-10-CM

## 2018-10-09 DIAGNOSIS — E782 Mixed hyperlipidemia: Secondary | ICD-10-CM | POA: Insufficient documentation

## 2018-10-09 DIAGNOSIS — E1165 Type 2 diabetes mellitus with hyperglycemia: Secondary | ICD-10-CM

## 2018-10-09 DIAGNOSIS — IMO0002 Reserved for concepts with insufficient information to code with codable children: Secondary | ICD-10-CM

## 2018-10-09 DIAGNOSIS — E1169 Type 2 diabetes mellitus with other specified complication: Secondary | ICD-10-CM | POA: Diagnosis not present

## 2018-10-09 HISTORY — DX: Overweight: E66.3

## 2018-10-09 LAB — POCT GLYCOSYLATED HEMOGLOBIN (HGB A1C): Hemoglobin A1C: 9.4 % — AB (ref 4.0–5.6)

## 2018-10-09 LAB — LIPID PANEL
Cholesterol: 158 mg/dL (ref 0–200)
HDL: 36.5 mg/dL — ABNORMAL LOW (ref >39.00)
LDL Cholesterol: 84 mg/dL (ref 0–99)
NonHDL: 121.42
Total CHOL/HDL Ratio: 4
Triglycerides: 189 mg/dL — ABNORMAL HIGH (ref 0.0–149.0)
VLDL: 37.8 mg/dL (ref 0.0–40.0)

## 2018-10-09 LAB — MICROALBUMIN / CREATININE URINE RATIO
Creatinine,U: 119.3 mg/dL
Microalb Creat Ratio: 271.6 mg/g — ABNORMAL HIGH (ref 0.0–30.0)
Microalb, Ur: 323.9 mg/dL — ABNORMAL HIGH (ref 0.0–1.9)

## 2018-10-09 NOTE — Patient Instructions (Addendum)
Please continue: - Metformin ER 750 mg at dinnertime - Glipizide ER 2.5 mg before breakfast - Tresiba 28 units daily >> move this to am and set alarm on your phone to take it - Ozempic 0.5 mg weekly  Check sugars later in the day, also.  Write comments when sugars are out of the ordinary for you.  Please come back for a follow-up appointment in 3 months.

## 2018-10-09 NOTE — Progress Notes (Signed)
Patient ID: [name] [name], female   DOB: [birth year], 78 y.o.   MRN: [id number]   HPI: [name] [name] is a 78 y.o.-year-old female, returning for follow-up for DM2, dx in the 1980s, insulin-dependent since ~2010, uncontrolled, with complications (dCHF, Cerebrovascular disease - history of CVA 2002, CKD, DR, PN).  Last visit 4 months ago.  Last hemoglobin A1c was: Lab Results  Component Value Date   HGBA1C 9.6 (A) 06/14/2018   HGBA1C 8.4 (A) 02/26/2018  09/10/2017: HbA1c 10.4%  Pt is on a regimen of: - Metformin ER 750 mg 2x a day >> once a day - Glipizide ER 2.5 mg before breakfast - Tresiba 28 units daily at night - Ozempic 0.5 mg weekly - added 06/2018 In 02/2018: We stopped Onglyza since this was expensive and started glipizide ER low-dose. She was previously also on Levemir 35 units daily.  Pt checks her sugars once a day per review of her log: - am: 57-253, (lately 69, 85-138, 175) >> 65, 79-250 >> 65, 71-168, 186-293 when forgets insulin - 2h after b'fast: n/c >> 269 >> n/c - before lunch: n/c - 2h after lunch: n/c >> 199 >> n/c - before dinner: n/c - 2h after dinner: n/c - bedtime: n/c - nighttime: n/c Lowest sugar was 50s in Spring 2019  -had to go to the ED; afterwards: 57 >> 65; she has hypoglycemia awareness in the 70s.  Highest sugar was 253 >> 269 >> 441 (missed insulin).  Glucometer: Accu-Chek [place]iva  Pt's meals are - eats 2x a day: - Breakfast: oatmeal + bacon - Lunch: veggies and a piece of chicken - small meal - Dinner: chicken, veggies - Snacks: apples, oranges  -+ CKD, last BUN/creatinine:  Lab Results  Component Value Date   BUN 28 (H) 05/21/2018   BUN 29 (H) 02/24/2017   CREATININE 1.47 (H) 05/21/2018   CREATININE 1.15 (H) 02/24/2017  09/10/2017: 27/1.52, GFR 37 On valsartan 320.  -+ HL; lipids: 08/14/2017: 151/187/44/70 No results found for: CHOL, HDL, LDLCALC, LDLDIRECT, TRIG, CHOLHDL  On Crestor 10.  - last eye exam was in 08/2017: + DR. +  IO injections q6 weeks.  Stroke affected her left eye vision  -No numbness but has tingling in her feet  On [name] 325.  Pt has FH of DM in father.  Latest TSH was reviewed and this was normal on 06/07/2016: 2.110.  ROS: Constitutional: no weight gain/no weight loss, no fatigue, no subjective hyperthermia, no subjective hypothermia Eyes: no blurry vision, no xerophthalmia ENT: no sore throat, no nodules palpated in neck, no dysphagia, no odynophagia, no hoarseness Cardiovascular: no CP/no SOB/no palpitations/no leg swelling Respiratory: no cough/no SOB/no wheezing Gastrointestinal: no N/no V/no D/no C/no acid reflux Musculoskeletal: no muscle aches/no joint aches Skin: no rashes, no hair loss Neurological: no tremors/no numbness/no tingling/no dizziness  I reviewed pt's medications, allergies, PMH, social hx, family hx, and changes were documented in the history of present illness. Otherwise, unchanged from my initial visit note.  Past Medical History:  Diagnosis Date  . Diabetes mellitus ([place])   . H/O: CVA (cerebrovascular accident) 2002  . HTN (hypertension)   . Hyperlipidemia   . Pulmonary HTN ([place])    mild by echo   Past Surgical History:  Procedure Laterality Date  . ABDOMINAL HYSTERECTOMY    . CARDIAC CATHETERIZATION  03/17/2003   Normal, patent coronary arteries. Normal LV systolic function  . CARDIOVASCULAR STRESS TEST  08/28/2006   Small sized, moderate ischemiain the Basal Inferoseptal,  Basal Inferior, Mid Inferior and Apical Inferior regions  . TRANSTHORACIC ECHOCARDIOGRAM  02/17/2010   EF >55%, moderate-severely thickened septum, mild-moderate mitral annular calcification   Social History   Socioeconomic History  . Marital status: Married    Spouse name: Not on file  . Number of children: Not on file  . Years of education: Not on file  . Highest education level: Not on file  Occupational History  . Occupation: retired  Scientific laboratory technician  . Financial resource  strain: Not hard at all  . Food insecurity    Worry: Never true    Inability: Never true  . Transportation needs    Medical: No    Non-medical: No  Tobacco Use  . Smoking status: Never Smoker  . Smokeless tobacco: Never Used  Substance and Sexual Activity  . Alcohol use: No  . Drug use: Never  . Sexual activity: Not Currently  Lifestyle  . Physical activity    Days per week: 7 days    Minutes per session: 10 min  . Stress: Not at all  Relationships  . Social Herbalist on phone: Not on file    Gets together: Not on file    Attends religious service: Not on file    Active member of club or organization: Not on file    Attends meetings of clubs or organizations: Not on file    Relationship status: Not on file  . Intimate partner violence    Fear of current or ex partner: No    Emotionally abused: No    Physically abused: No    Forced sexual activity: No  Other Topics Concern  . Not on file  Social History Narrative  . Not on file   Current Outpatient Medications on File Prior to Visit  Medication Sig Dispense Refill  . ACCU-CHEK AVIVA PLUS test strip TEST 3 TIMES DAILY 100 each 11  . amLODipine (NORVASC) 10 MG tablet TAKE 1 TABLET BY MOUTH ONCE DAILY 90 tablet 1  . aspirin 325 MG EC tablet Take 325 mg by mouth daily.    . B-D UF III MINI PEN NEEDLES 31G X 5 MM MISC   1  . Blood Glucose Monitoring Suppl (ACCU-CHEK AVIVA) device Check blood sugars 3 times daily E11.65 1 each 0  . glipiZIDE (GLUCOTROL XL) 2.5 MG 24 hr tablet Take 1 tablet (2.5 mg total) by mouth daily with breakfast. 90 tablet 3  . hydrALAZINE (APRESOLINE) 25 MG tablet TAKE 1 TABLET(25 MG) BY MOUTH TWICE DAILY 180 tablet 1  . Lancets (ACCU-CHEK SOFT TOUCH) lancets Check blood sugars 3 times daily E11.65 100 each 12  . latanoprost (XALATAN) 0.005 % ophthalmic solution   1  . metFORMIN (GLUCOPHAGE-XR) 750 MG 24 hr tablet 750 mg. 1 tablet daily  1  . metoprolol succinate (TOPROL-XL) 25 MG 24 hr  tablet TAKE 1 TABLET BY MOUTH ONCE DAILY 90 tablet 0  . Multiple Vitamin (MULTIVITAMIN) tablet Take 1 tablet by mouth daily.    . rosuvastatin (CRESTOR) 10 MG tablet TAKE 1 TABLET BY MOUTH EVERY DAY 90 tablet 1  . Semaglutide,0.25 or 0.5MG /DOS, (OZEMPIC, 0.25 OR 0.5 MG/DOSE,) 2 MG/1.5ML SOPN Inject 0.5 mg into the skin once a week. 2 pen 5  . telmisartan-hydrochlorothiazide (MICARDIS HCT) 80-25 MG tablet TAKE 1 TABLET BY MOUTH EVERY DAY 90 tablet 1  . TRESIBA FLEXTOUCH 200 UNIT/ML SOPN 28 Units.   2   No current facility-administered medications on file prior to visit.  Allergies  Allergen Reactions  . Brimonidine Other (See Comments)    Eyes swell and itch  . Dorzolamide     Other reaction(s): Other (See Comments) Red, inflamed eyelid, dermatitis   . Timolol Other (See Comments)    Caused eyes to swell and itch   Family History  Problem Relation Age of Onset  . Hypertension Mother   . Hypertension Father   . Heart Problems Father   . Hypertension Sister   . Diabetes Sister   . Hypertension Brother   . Diabetes Brother     PE: BP 126/70   Pulse 90   Ht 5' 1.75" (1.568 m) Comment: MEASURED WITHOUT SHOES TODAY  Wt 148 lb (67.1 kg)   SpO2 98%   BMI 27.29 kg/m   Wt Readings from Last 3 Encounters:  10/09/18 148 lb (67.1 kg)  08/13/18 148 lb (67.1 kg)  08/13/18 148 lb (67.1 kg)   Constitutional: Slightly over weight, in NAD Eyes: PERRLA, EOMI, no exophthalmos ENT: moist mucous membranes, no thyromegaly, no cervical lymphadenopathy Cardiovascular: RRR, No MRG Respiratory: CTA B Gastrointestinal: abdomen soft, NT, ND, BS+ Musculoskeletal: no deformities, strength intact in all 4 Skin: moist, warm, no rashes Neurological: no tremor with outstretched hands, DTR normal in all 4  ASSESSMENT: 1. DM2, insulin-dependent, uncontrolled, with long-term complications - dCHF - Cerebrovascular disease, history of CVA 2002 - CKD - DR - PN  2. HL  3.  Overweight  PLAN:   1. Patient with longstanding, uncontrolled type 2 diabetes, on oral antidiabetic regimen and also basal insulin, to which we added a weekly GLP-1 receptor agonist at last visit.  She has a history of CHF so ideally should be on an SGLT2 inhibitor, but her GFR is low so we cannot use this.  At last visit, sugars were higher, and more fluctuating in the was not checking later in the day.  Sugars were higher in the morning whenever she was eating out so we discussed about reducing this.  I also advised her to start to check sugars later in the day, also.  She did see improvement in her blood sugars with improved diet in the past. -At this visit, she tells me that she tolerates Ozempic well and this is covered by her insurance.  We will continue with it.  However, sugars continue to be high in the morning whenever she forgets the insulin, which is frequently.  At last visit I advised her to take [place] in the morning but she is actually taking it at night and many times she falls asleep without taking it.  We discussed that this is not a bowel movement in the morning and may be even setting alarms on her phone to take it.  She is not checking sugars later in the day and I strongly advised her to do so.  For now, we will continue the current medications. - I suggested to:  Patient Instructions  Please continue: - Metformin ER 750 mg at dinnertime - Glipizide ER 2.5 mg before breakfast - Tresiba 28 units daily >> move this to am and set alarm on your phone to take it - Ozempic 0.5 mg weekly  Check sugars later in the day, also.  Write comments when sugars are out of the ordinary for you.  Please come back for a follow-up appointment in 3 months.  - today, HbA1c is 9.4% (slightly better) - continue checking sugars at different times of the day - check 1x a day, rotating checks -  advised for yearly eye exams >> she is not UTD, but she has an exam pending tomorrow - Return to clinic in 3 mo with sugar  log   2. HL - Reviewed latest lipid panel from 08/2017 at [facility], triglycerides slightly high - Continues Crestor without side effects. - Recheck lipid panel today-these fasting  3.  Overweight -Weight stable since last visit -Continue Ozempic which should also help with weight loss  Component     Latest Ref Rng & Units 10/09/2018          Glucose     65 - 99 mg/dL 89  BUN     7 - 25 mg/dL 35 (H)  Creatinine     0.60 - 0.88 mg/dL 1.95 (H)  GFR, Est Non African American     > OR = 60 mL/min/1.51m2 24 (L)  GFR, Est African American     > OR = 60 mL/min/1.18m2 27 (L)  BUN/Creatinine Ratio     6 - 22 (calc) 18  Sodium     135 - 146 mmol/L 142  Potassium     3.5 - 5.3 mmol/L 4.1  Chloride     98 - 110 mmol/L 103  CO2     20 - 32 mmol/L 29  Calcium     8.6 - 10.4 mg/dL 10.1  Total Protein     6.1 - 8.1 g/dL 6.8  Albumin MSPROF     3.6 - 5.1 g/dL 4.0  Globulin     1.9 - 3.7 g/dL (calc) 2.8  AG Ratio     1.0 - 2.5 (calc) 1.4  Total Bilirubin     0.2 - 1.2 mg/dL 0.4  Alkaline phosphatase (APISO)     37 - 153 U/L 93  AST     10 - 35 U/L 17  ALT     6 - 29 U/L 13  Cholesterol     0 - 200 mg/dL 158  Triglycerides     0.0 - 149.0 mg/dL 189.0 (H)  HDL Cholesterol     >39.00 mg/dL 36.50 (L)  VLDL     0.0 - 40.0 mg/dL 37.8  LDL (calc)     0 - 99 mg/dL 84  Total CHOL/HDL Ratio      4  NonHDL      121.42  [place], Ur     0.0 - 1.9 mg/dL 323.9 (H)  Creatinine,U     mg/dL 119.3  MICROALB/CREAT RATIO     0.0 - 30.0 mg/g 271.6 (H)   Kidney function is lower.  ACR is elevated.  She is on a low dose of metformin, which I would continue for now.  She may benefit from referral to nephrology.  Philemon Kingdom, MD PhD [facility] Endocrinology

## 2018-10-10 LAB — COMPLETE METABOLIC PANEL WITH GFR
AG Ratio: 1.4 (calc) (ref 1.0–2.5)
ALT: 13 U/L (ref 6–29)
AST: 17 U/L (ref 10–35)
Albumin: 4 g/dL (ref 3.6–5.1)
Alkaline phosphatase (APISO): 93 U/L (ref 37–153)
BUN/Creatinine Ratio: 18 (calc) (ref 6–22)
BUN: 35 mg/dL — ABNORMAL HIGH (ref 7–25)
CO2: 29 mmol/L (ref 20–32)
Calcium: 10.1 mg/dL (ref 8.6–10.4)
Chloride: 103 mmol/L (ref 98–110)
Creat: 1.95 mg/dL — ABNORMAL HIGH (ref 0.60–0.88)
GFR, Est African American: 27 mL/min/{1.73_m2} — ABNORMAL LOW (ref >=60)
GFR, Est Non African American: 24 mL/min/{1.73_m2} — ABNORMAL LOW (ref >=60)
Globulin: 2.8 g/dL (calc) (ref 1.9–3.7)
Glucose, Bld: 89 mg/dL (ref 65–99)
Potassium: 4.1 mmol/L (ref 3.5–5.3)
Sodium: 142 mmol/L (ref 135–146)
Total Bilirubin: 0.4 mg/dL (ref 0.2–1.2)
Total Protein: 6.8 g/dL (ref 6.1–8.1)

## 2018-10-15 ENCOUNTER — Telehealth: Payer: Self-pay

## 2018-10-15 NOTE — Telephone Encounter (Signed)
-----   Message from Philemon Kingdom, MD sent at 10/10/2018  8:39 AM EDT ----- Lenna Sciara, can you please call pt:  Lipid panel is ok.  Liver function is normal. Kidney function is lower.  Her urine proteins are slightly elevated.  She is on a low dose of metformin, which I would continue for now.  She may benefit from referral to nephrology.  I sent the results to her PCP.

## 2018-10-16 NOTE — Telephone Encounter (Signed)
Lm for patient to return call.

## 2018-10-17 ENCOUNTER — Ambulatory Visit
Admission: RE | Admit: 2018-10-17 | Discharge: 2018-10-17 | Disposition: A | Discharge status: Home / Self Care | Payer: Medicare Other | Source: Ambulatory Visit | Attending: Internal Medicine | Admitting: Internal Medicine

## 2018-10-17 ENCOUNTER — Other Ambulatory Visit: Payer: Self-pay

## 2018-10-17 DIAGNOSIS — Z1231 Encounter for screening mammogram for malignant neoplasm of breast: Secondary | ICD-10-CM

## 2018-10-21 NOTE — Telephone Encounter (Signed)
Notified patient of message from Dr. Gherghe, patient expressed understanding and agreement. No further questions.  

## 2018-11-14 ENCOUNTER — Other Ambulatory Visit: Payer: Self-pay | Admitting: Internal Medicine

## 2018-12-19 DIAGNOSIS — H34812 Central retinal vein occlusion, left eye, with macular edema: Secondary | ICD-10-CM | POA: Diagnosis not present

## 2018-12-19 DIAGNOSIS — H401133 Primary open-angle glaucoma, bilateral, severe stage: Secondary | ICD-10-CM | POA: Diagnosis not present

## 2018-12-19 DIAGNOSIS — E11311 Type 2 diabetes mellitus with unspecified diabetic retinopathy with macular edema: Secondary | ICD-10-CM | POA: Diagnosis not present

## 2018-12-19 DIAGNOSIS — E113311 Type 2 diabetes mellitus with moderate nonproliferative diabetic retinopathy with macular edema, right eye: Secondary | ICD-10-CM | POA: Diagnosis not present

## 2018-12-19 DIAGNOSIS — H5440 Blindness, one eye, unspecified eye: Secondary | ICD-10-CM | POA: Diagnosis not present

## 2019-01-01 DIAGNOSIS — L298 Other pruritus: Secondary | ICD-10-CM | POA: Diagnosis not present

## 2019-01-01 DIAGNOSIS — L718 Other rosacea: Secondary | ICD-10-CM | POA: Diagnosis not present

## 2019-01-17 ENCOUNTER — Ambulatory Visit: Payer: Medicare Other | Admitting: Internal Medicine

## 2019-01-23 ENCOUNTER — Other Ambulatory Visit: Payer: Self-pay

## 2019-01-23 DIAGNOSIS — IMO0002 Reserved for concepts with insufficient information to code with codable children: Secondary | ICD-10-CM

## 2019-01-23 DIAGNOSIS — E1165 Type 2 diabetes mellitus with hyperglycemia: Secondary | ICD-10-CM

## 2019-01-23 DIAGNOSIS — E1159 Type 2 diabetes mellitus with other circulatory complications: Secondary | ICD-10-CM

## 2019-01-23 MED ORDER — TRESIBA FLEXTOUCH 200 UNIT/ML ~~LOC~~ SOPN: 28.0000 [IU] | PEN_INJECTOR | Freq: Every day | SUBCUTANEOUS | 5 refills | Status: DC

## 2019-01-27 ENCOUNTER — Other Ambulatory Visit: Payer: Self-pay

## 2019-01-27 MED ORDER — BD PEN NEEDLE MINI U/F 31G X 5 MM MISC: 1 refills | Status: DC

## 2019-02-11 ENCOUNTER — Other Ambulatory Visit: Payer: Self-pay | Admitting: Internal Medicine

## 2019-02-12 ENCOUNTER — Encounter (HOSPITAL_COMMUNITY): Payer: Self-pay | Admitting: Emergency Medicine

## 2019-02-12 ENCOUNTER — Other Ambulatory Visit: Payer: Self-pay

## 2019-02-12 ENCOUNTER — Emergency Department (HOSPITAL_COMMUNITY): Payer: Medicare Other

## 2019-02-12 ENCOUNTER — Emergency Department (HOSPITAL_COMMUNITY)
Admission: EM | Admit: 2019-02-12 | Discharge: 2019-02-12 | Disposition: A | Discharge status: Home / Self Care | Payer: Medicare Other | Attending: Emergency Medicine | Admitting: Emergency Medicine

## 2019-02-12 DIAGNOSIS — Z79899 Other long term (current) drug therapy: Secondary | ICD-10-CM | POA: Insufficient documentation

## 2019-02-12 DIAGNOSIS — Z7984 Long term (current) use of oral hypoglycemic drugs: Secondary | ICD-10-CM | POA: Diagnosis not present

## 2019-02-12 DIAGNOSIS — I10 Essential (primary) hypertension: Secondary | ICD-10-CM | POA: Diagnosis not present

## 2019-02-12 DIAGNOSIS — E119 Type 2 diabetes mellitus without complications: Secondary | ICD-10-CM | POA: Diagnosis not present

## 2019-02-12 DIAGNOSIS — R531 Weakness: Secondary | ICD-10-CM | POA: Diagnosis not present

## 2019-02-12 DIAGNOSIS — E876 Hypokalemia: Secondary | ICD-10-CM

## 2019-02-12 DIAGNOSIS — Z743 Need for continuous supervision: Secondary | ICD-10-CM | POA: Diagnosis not present

## 2019-02-12 DIAGNOSIS — R42 Dizziness and giddiness: Secondary | ICD-10-CM | POA: Diagnosis not present

## 2019-02-12 DIAGNOSIS — E1165 Type 2 diabetes mellitus with hyperglycemia: Secondary | ICD-10-CM | POA: Diagnosis not present

## 2019-02-12 DIAGNOSIS — R55 Syncope and collapse: Secondary | ICD-10-CM

## 2019-02-12 DIAGNOSIS — Z7982 Long term (current) use of aspirin: Secondary | ICD-10-CM | POA: Insufficient documentation

## 2019-02-12 LAB — CBC WITH DIFFERENTIAL/PLATELET
Abs Immature Granulocytes: 0.02 10*3/uL (ref 0.00–0.07)
Basophils Absolute: 0.1 10*3/uL (ref 0.0–0.1)
Basophils Relative: 1 %
Eosinophils Absolute: 0.5 10*3/uL (ref 0.0–0.5)
Eosinophils Relative: 5 %
HCT: 32.3 % — ABNORMAL LOW (ref 36.0–46.0)
Hemoglobin: 10.8 g/dL — ABNORMAL LOW (ref 12.0–15.0)
Immature Granulocytes: 0 %
Lymphocytes Relative: 20 %
Lymphs Abs: 2 10*3/uL (ref 0.7–4.0)
MCH: 29 pg (ref 26.0–34.0)
MCHC: 33.4 g/dL (ref 30.0–36.0)
MCV: 86.8 fL (ref 80.0–100.0)
Monocytes Absolute: 0.8 10*3/uL (ref 0.1–1.0)
Monocytes Relative: 8 %
Neutro Abs: 6.6 10*3/uL (ref 1.7–7.7)
Neutrophils Relative %: 66 %
Platelets: 382 10*3/uL (ref 150–400)
RBC: 3.72 MIL/uL — ABNORMAL LOW (ref 3.87–5.11)
RDW: 12.5 % (ref 11.5–15.5)
WBC: 9.9 10*3/uL (ref 4.0–10.5)
nRBC: 0 % (ref 0.0–0.2)

## 2019-02-12 LAB — URINALYSIS, ROUTINE W REFLEX MICROSCOPIC
Bilirubin Urine: NEGATIVE
Glucose, UA: 150 mg/dL — AB
Hgb urine dipstick: NEGATIVE
Ketones, ur: NEGATIVE mg/dL
Leukocytes,Ua: NEGATIVE
Nitrite: NEGATIVE
Protein, ur: 100 mg/dL — AB
Specific Gravity, Urine: 1.013 (ref 1.005–1.030)
pH: 6 (ref 5.0–8.0)

## 2019-02-12 LAB — BASIC METABOLIC PANEL
Anion gap: 13 (ref 5–15)
BUN: 32 mg/dL — ABNORMAL HIGH (ref 8–23)
CO2: 24 mmol/L (ref 22–32)
Calcium: 9.5 mg/dL (ref 8.9–10.3)
Chloride: 103 mmol/L (ref 98–111)
Creatinine, Ser: 1.56 mg/dL — ABNORMAL HIGH (ref 0.44–1.00)
GFR calc Af Amer: 36 mL/min — ABNORMAL LOW (ref >60)
GFR calc non Af Amer: 31 mL/min — ABNORMAL LOW (ref >60)
Glucose, Bld: 285 mg/dL — ABNORMAL HIGH (ref 70–99)
Potassium: 3 mmol/L — ABNORMAL LOW (ref 3.5–5.1)
Sodium: 140 mmol/L (ref 135–145)

## 2019-02-12 LAB — TROPONIN I (HIGH SENSITIVITY)
Troponin I (High Sensitivity): 13 ng/L (ref <18)
Troponin I (High Sensitivity): 15 ng/L (ref <18)

## 2019-02-12 LAB — CBG MONITORING, ED: Glucose-Capillary: 274 mg/dL — ABNORMAL HIGH (ref 70–99)

## 2019-02-12 MED ORDER — SODIUM CHLORIDE 0.9 % IV BOLUS: 1000.0000 mL | Freq: Once | INTRAVENOUS | Status: DC

## 2019-02-12 MED ORDER — POTASSIUM CHLORIDE CRYS ER 20 MEQ PO TBCR
20.0000 meq | EXTENDED_RELEASE_TABLET | Freq: Once | ORAL | Status: AC
  Administered 2019-02-12: 15:00:00 20 meq via ORAL
  Filled 2019-02-12: qty 1

## 2019-02-12 MED ORDER — POTASSIUM CHLORIDE CRYS ER 20 MEQ PO TBCR: 20.0000 meq | EXTENDED_RELEASE_TABLET | Freq: Every day | ORAL | 0 refills | Status: DC

## 2019-02-12 MED ORDER — METOPROLOL SUCCINATE ER 25 MG PO TB24
25.0000 mg | ORAL_TABLET | Freq: Every day | ORAL | Status: DC
  Administered 2019-02-12: 25 mg via ORAL
  Filled 2019-02-12: qty 1

## 2019-02-12 MED ORDER — AMLODIPINE BESYLATE 5 MG PO TABS
10.0000 mg | ORAL_TABLET | Freq: Once | ORAL | Status: AC
  Administered 2019-02-12: 10 mg via ORAL
  Filled 2019-02-12: qty 2

## 2019-02-12 MED ORDER — HYDRALAZINE HCL 25 MG PO TABS
25.0000 mg | ORAL_TABLET | Freq: Once | ORAL | Status: AC
  Administered 2019-02-12: 13:00:00 25 mg via ORAL
  Filled 2019-02-12: qty 1

## 2019-02-12 NOTE — ED Notes (Signed)
Patient was able to sit and stand without any dizziness.

## 2019-02-12 NOTE — ED Notes (Signed)
Pt returned from MRI °

## 2019-02-12 NOTE — Discharge Instructions (Addendum)
Your lab tests, ekg, MRI and exam today are reassuring.  Your potassium was a little low but probably not the reason for your symptoms today.  You have been prescribed additional potassium to complete this replacement.  Call your MD for an office visit to recheck your symptoms. Return here for any new or return of your symptoms.

## 2019-02-12 NOTE — ED Notes (Signed)
Patient ambulatory to the bathroom - denies any dizziness

## 2019-02-12 NOTE — ED Triage Notes (Signed)
Pt was in her kitchen, got dizzy.  BP elevated and BG 390

## 2019-02-12 NOTE — ED Notes (Signed)
Patient transported to MRI 

## 2019-02-12 NOTE — ED Provider Notes (Signed)
[facility] EMERGENCY DEPARTMENT Provider Note   CSN: [id number] Arrival date & time: 02/12/19  1139     History   Chief Complaint Chief Complaint  Patient presents with  . Dizziness    HPI [name] [name] is a 78 y.o. 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  Her husband helped her to the ground, so she did not fall.  She reports feeling generalized weakness without focal weakness, denies having any cp, palpitations, sob, also no headache, vision changes, n/v or other complaints. She currently is sx free.     The history is provided by the patient.    Past Medical History:  Diagnosis Date  . Diabetes mellitus ([place])   . H/O: CVA (cerebrovascular accident) 2002  . HTN (hypertension)   . Hyperlipidemia   . Pulmonary HTN ([place])    mild by echo    Patient Active Problem List   Diagnosis Date Noted  . Hyperlipidemia associated with type 2 diabetes mellitus ([place]) 10/09/2018  . Overweight (BMI 25.0-29.9) 10/09/2018  . Left ventricular diastolic dysfunction, NYHA class 1 02/06/2013  . CVA, old, hemiparesis ([place]) 02/06/2013  . HTN (hypertension)   . Uncontrolled type 2 diabetes mellitus with circulatory disorder, with long-term current use of insulin [facility])     Past Surgical History:  Procedure Laterality Date  . ABDOMINAL HYSTERECTOMY    . CARDIAC CATHETERIZATION  03/17/2003   Normal, patent coronary arteries. Normal LV systolic function  . CARDIOVASCULAR STRESS TEST  08/28/2006   Small sized, moderate ischemiain the Basal Inferoseptal, Basal Inferior, Mid Inferior and Apical Inferior regions  . TRANSTHORACIC ECHOCARDIOGRAM  02/17/2010   EF >55%,  moderate-severely thickened septum, mild-moderate mitral annular calcification     OB History   No obstetric history on file.      Home Medications    Prior to Admission medications   Medication Sig Start Date End Date Taking? Authorizing Provider  amLODipine (NORVASC) 10 MG tablet TAKE 1 TABLET BY MOUTH EVERY DAY 11/15/18  Yes Glendale Chard, MD  aspirin 325 MG EC tablet Take 325 mg by mouth daily.   Yes [provider]  glipiZIDE (GLUCOTROL XL) 2.5 MG 24 hr tablet TAKE 1 TABLET(2.5 MG) BY MOUTH DAILY WITH BREAKFAST 02/11/19  Yes Philemon Kingdom, MD  hydrALAZINE (APRESOLINE) 25 MG tablet TAKE 1 TABLET(25 MG) BY MOUTH TWICE DAILY Patient taking differently: Take 25 mg by mouth daily. TAKE 1 TABLET(25 MG) BY MOUTH TWICE DAILY 08/13/18  Yes Glendale Chard, MD  hydrOXYzine (ATARAX/VISTARIL) 25 MG tablet Take 1 tablet by mouth at bedtime as needed. 1-2 tablets at bedtime prn 01/01/19  Yes [provider]  Icosapent Ethyl (VASCEPA) 1 g CAPS Take 1 capsule by mouth at bedtime.   Yes [provider]  metFORMIN (GLUCOPHAGE-XR) 750 MG 24 hr tablet TAKE 1 TABLET BY MOUTH TWICE DAILY WITH FOOD Patient taking differently: Take 1,000 mg by mouth daily with breakfast.  11/15/18  Yes Glendale Chard, MD  Multiple Vitamin (MULTIVITAMIN) tablet Take 1 tablet by mouth daily.   Yes [provider]  Semaglutide,0.25 or 0.5MG /DOS, (OZEMPIC, 0.25 OR 0.5 MG/DOSE,) 2 MG/1.5ML  SOPN Inject 0.5 mg into the skin once a week. 06/14/18  Yes Philemon Kingdom, MD  timolol (BETIMOL) 0.5 % ophthalmic solution Place 1 drop into both eyes at bedtime.   Yes [provider]  TRESIBA FLEXTOUCH 200 UNIT/ML SOPN Inject 28 Units into the skin at bedtime. 01/23/19  Yes Glendale Chard, MD  valsartan (DIOVAN) 320 MG tablet Take 320 mg by mouth daily.   Yes [provider]  ACCU-CHEK AVIVA PLUS test strip TEST 3 TIMES DAILY Patient not taking: Reported on 02/12/2019 07/22/18   Glendale Chard, MD  B-D UF III MINI PEN NEEDLES 31G X 5 MM MISC Use as directed once per day with Victoza and 2 times per day with Levemir 01/27/19   Glendale Chard, MD  Lancets (ACCU-CHEK SOFT [facility]) lancets Check blood sugars 3 times daily E11.65 Patient not taking: Reported on 02/12/2019 02/12/18   Glendale Chard, MD  metoprolol succinate (TOPROL-XL) 25 MG 24 hr tablet TAKE 1 TABLET BY MOUTH EVERY DAY Patient not taking: Reported on 02/12/2019 11/15/18   Glendale Chard, MD  metroNIDAZOLE (METROCREAM) 0.75 % cream Apply 1 application topically daily as needed. 01/01/19   [provider]  potassium chloride SA (KLOR-CON) 20 MEQ tablet Take 1 tablet (20 mEq total) by mouth daily. 02/12/19   Evalee Jefferson, PA-C  rosuvastatin (CRESTOR) 10 MG tablet TAKE 1 TABLET BY MOUTH EVERY DAY Patient not taking: Reported on 02/12/2019 11/15/18   Glendale Chard, MD  telmisartan-hydrochlorothiazide (MICARDIS HCT) 80-25 MG tablet TAKE 1 TABLET BY MOUTH EVERY DAY Patient not taking: Reported on 02/12/2019 09/17/18   Glendale Chard, MD    Family History Family History  Problem Relation Age of Onset  . Hypertension Mother   . Hypertension Father   . Heart Problems Father   . Hypertension Sister   . Diabetes Sister   . Hypertension Brother   . Diabetes Brother     Social History Social History   Tobacco Use  . Smoking status: Never Smoker  . Smokeless tobacco: Never Used  Substance Use Topics  . Alcohol use: No  . Drug use: Never     Allergies   Brimonidine, Dorzolamide, and Timolol   Review of Systems Review of Systems  Constitutional: Negative for fever.  HENT: Negative for congestion and sore throat.   Eyes: Negative.   Respiratory: Negative for chest tightness and shortness of breath.   Cardiovascular: Negative for chest pain.  Gastrointestinal: Negative for abdominal pain, nausea and vomiting.  Genitourinary: Negative.   Musculoskeletal: Negative for arthralgias, joint swelling and neck pain.   Skin: Negative.  Negative for rash and wound.  Neurological: Positive for weakness. Negative for dizziness, light-headedness, numbness and headaches.  Psychiatric/Behavioral: Negative.      Physical Exam Updated Vital Signs BP (!) 141/52   Pulse 75   Temp 98.2 F (36.8 C)   Resp 16   Ht 5\' 2"  (1.575 m)   Wt 65.3 kg   SpO2 98%   BMI 26.34 kg/m   Physical Exam Vitals signs and nursing note reviewed.  Constitutional:      Appearance: She is well-developed.  HENT:     Head: Normocephalic and atraumatic.  Eyes:     Extraocular Movements: Extraocular movements intact.     Conjunctiva/sclera: Conjunctivae normal.  Neck:     Musculoskeletal: Normal range of motion.  Cardiovascular:     Rate and Rhythm: Normal rate and regular rhythm.     Pulses: Normal pulses.     Heart  sounds: Normal heart sounds. No murmur.  Pulmonary:     Effort: Pulmonary effort is normal.     Breath sounds: Normal breath sounds. No wheezing.  Abdominal:     General: Bowel sounds are normal.     Palpations: Abdomen is soft.     Tenderness: There is no abdominal tenderness.  Musculoskeletal: Normal range of motion.  Skin:    General: Skin is warm and dry.  Neurological:     General: No focal deficit present.     Mental Status: She is alert and oriented to person, place, and time.     Cranial Nerves: No cranial nerve deficit.     Sensory: No sensory deficit.     Motor: No weakness.     Coordination: Coordination is intact.     Comments: Equal grip strength.      ED Treatments / Results  Labs (all labs ordered are listed, but only abnormal results are displayed) Labs Reviewed  CBC WITH DIFFERENTIAL/PLATELET - Abnormal; Notable for the following components:      Result Value   RBC 3.72 (*)    Hemoglobin 10.8 (*)    HCT 32.3 (*)    All other components within normal limits  BASIC METABOLIC PANEL - Abnormal; Notable for the following components:   Potassium 3.0 (*)    Glucose, Bld 285 (*)     BUN 32 (*)    Creatinine, Ser 1.56 (*)    GFR calc non Af Amer 31 (*)    GFR calc Af Amer 36 (*)    All other components within normal limits  URINALYSIS, ROUTINE W REFLEX MICROSCOPIC - Abnormal; Notable for the following components:   Color, Urine STRAW (*)    Glucose, UA 150 (*)    Protein, ur 100 (*)    Bacteria, UA RARE (*)    All other components within normal limits  CBG MONITORING, ED - Abnormal; Notable for the following components:   Glucose-Capillary 274 (*)    All other components within normal limits  TROPONIN I (HIGH SENSITIVITY)  TROPONIN I (HIGH SENSITIVITY)    EKG EKG Interpretation  Date/Time:  Wednesday February 12 2019 11:47:15 EST Ventricular Rate:  74 PR Interval:    QRS Duration: 135 QT Interval:  424 QTC Calculation: 471 R Axis:   -48 Text Interpretation: Sinus rhythm Left bundle branch block No significant change was found Confirmed by Ezequiel Essex [phone]) on 02/12/2019 11:57:38 AM   Radiology Mr Brain Wo Contrast  Result Date: 02/12/2019 CLINICAL DATA:  Persistent vertigo EXAM: MRI HEAD WITHOUT CONTRAST TECHNIQUE: Multiplanar, multiecho pulse sequences of the brain and surrounding structures were obtained without intravenous contrast. COMPARISON:  None. FINDINGS: Brain: There is encephalomalacia and gliosis involving the right inferior parietal, posterior temporal, and occipital lobes with associated ex vacuo dilatation of the adjacent right lateral ventricle. Few additional scattered foci of T2 hyperintensity in the supratentorial white matter are nonspecific though may reflect mild chronic microvascular ischemic changes. There is no acute infarction or intracranial hemorrhage. There is no intracranial mass, mass effect, or edema. There is no hydrocephalus or extra-axial fluid collection. Vascular: Major vessel flow voids at the skull base are preserved. Skull and upper cervical spine: Normal marrow signal is preserved. Sinuses/Orbits: Paranasal  sinuses are aerated. Orbits are unremarkable. Other: Sella is unremarkable.  Mastoid air cells are clear. IMPRESSION: No evidence of recent infarction, intracranial hemorrhage, or mass. Chronic right MCA/PCA territory infarcts. Mild chronic microvascular ischemic changes. Electronically Signed   By:  Macy Mis M.D.   On: 02/12/2019 14:13   Dg Chest Portable 1 View  Result Date: 02/12/2019 CLINICAL DATA:  Weakness.  Near-syncope. EXAM: PORTABLE CHEST 1 VIEW COMPARISON:  Chest x-ray dated February 28, 2005. FINDINGS: The heart size and mediastinal contours are within normal limits. Normal pulmonary vascularity. No focal consolidation, pleural effusion, or pneumothorax. Unchanged elevation of the right hemidiaphragm. No acute osseous abnormality. IMPRESSION: No active disease. Electronically Signed   By: Titus Dubin M.D.   On: 02/12/2019 12:43    Procedures Procedures (including critical care time)  Medications Ordered in ED Medications  amLODipine (NORVASC) tablet 10 mg (10 mg Oral Given 02/12/19 1253)  hydrALAZINE (APRESOLINE) tablet 25 mg (25 mg Oral Given 02/12/19 1253)  potassium chloride SA (KLOR-CON) CR tablet 20 mEq (20 mEq Oral Given 02/12/19 1514)     Initial Impression / Assessment and Plan / ED Course  I have reviewed the triage vital signs and the nursing notes.  Pertinent labs & imaging results that were available during my care of the patient were reviewed by me and considered in my medical decision making (see chart for details).        Pt's labs and imaging reviewed and discussed with pt.  No acute findings.  She had no sx while here, ambulated without weakness or dizziness.  Mild hypokalemia which was supplemented, but doubt this as source of sx.  Elevated glucose, pt has not had her DM meds yet today, no dka.   Monitored while here with no evidence for arrhythmias, asymptomatic during orthostatic maneuvers.  MRI negative for brainstem cva.  Advised recheck by pcp  within 1 week. Close watch on cbg's and diet.  Renal insufficiency is chronic.   Final Clinical Impressions(s) / ED Diagnoses   Final diagnoses:  Near syncope  Hypokalemia    ED Discharge Orders         Ordered    potassium chloride SA (KLOR-CON) 20 MEQ tablet  Daily     02/12/19 1610           Evalee Jefferson, PA-C 02/13/19 0754    Ezequiel Essex, MD 02/13/19 1036

## 2019-02-13 ENCOUNTER — Encounter: Payer: Self-pay | Admitting: Internal Medicine

## 2019-02-13 ENCOUNTER — Ambulatory Visit (INDEPENDENT_AMBULATORY_CARE_PROVIDER_SITE_OTHER): Payer: Medicare Other | Admitting: Internal Medicine

## 2019-02-13 ENCOUNTER — Other Ambulatory Visit: Payer: Self-pay

## 2019-02-13 VITALS — BP 132/70 | HR 73 | Temp 98.4°F | Ht 62.0 in | Wt 145.8 lb

## 2019-02-13 DIAGNOSIS — E1122 Type 2 diabetes mellitus with diabetic chronic kidney disease: Secondary | ICD-10-CM

## 2019-02-13 DIAGNOSIS — Z6826 Body mass index (BMI) 26.0-26.9, adult: Secondary | ICD-10-CM

## 2019-02-13 DIAGNOSIS — E663 Overweight: Secondary | ICD-10-CM

## 2019-02-13 DIAGNOSIS — E876 Hypokalemia: Secondary | ICD-10-CM

## 2019-02-13 DIAGNOSIS — R55 Syncope and collapse: Secondary | ICD-10-CM | POA: Diagnosis not present

## 2019-02-13 DIAGNOSIS — I129 Hypertensive chronic kidney disease with stage 1 through stage 4 chronic kidney disease, or unspecified chronic kidney disease: Secondary | ICD-10-CM | POA: Diagnosis not present

## 2019-02-13 DIAGNOSIS — N182 Chronic kidney disease, stage 2 (mild): Secondary | ICD-10-CM

## 2019-02-13 MED ORDER — HYDRALAZINE HCL 25 MG PO TABS: ORAL_TABLET | ORAL | 1 refills | Status: DC

## 2019-02-13 MED ORDER — METOPROLOL SUCCINATE ER 25 MG PO TB24: 25.0000 mg | ORAL_TABLET | Freq: Every day | ORAL | 1 refills | Status: DC

## 2019-02-14 ENCOUNTER — Encounter: Payer: Self-pay | Admitting: Internal Medicine

## 2019-02-14 ENCOUNTER — Ambulatory Visit (INDEPENDENT_AMBULATORY_CARE_PROVIDER_SITE_OTHER): Payer: Medicare Other | Admitting: Internal Medicine

## 2019-02-14 VITALS — BP 150/60 | HR 72 | Ht 61.75 in | Wt 147.0 lb

## 2019-02-14 DIAGNOSIS — E663 Overweight: Secondary | ICD-10-CM | POA: Diagnosis not present

## 2019-02-14 DIAGNOSIS — E1165 Type 2 diabetes mellitus with hyperglycemia: Secondary | ICD-10-CM | POA: Diagnosis not present

## 2019-02-14 DIAGNOSIS — E785 Hyperlipidemia, unspecified: Secondary | ICD-10-CM | POA: Diagnosis not present

## 2019-02-14 DIAGNOSIS — IMO0002 Reserved for concepts with insufficient information to code with codable children: Secondary | ICD-10-CM

## 2019-02-14 DIAGNOSIS — E1169 Type 2 diabetes mellitus with other specified complication: Secondary | ICD-10-CM

## 2019-02-14 DIAGNOSIS — E1159 Type 2 diabetes mellitus with other circulatory complications: Secondary | ICD-10-CM

## 2019-02-14 DIAGNOSIS — Z794 Long term (current) use of insulin: Secondary | ICD-10-CM

## 2019-02-14 LAB — HEMOGLOBIN A1C
Est. average glucose Bld gHb Est-mCnc: 209 mg/dL
Hgb A1c MFr Bld: 8.9 % — ABNORMAL HIGH (ref 4.8–5.6)

## 2019-02-14 NOTE — Progress Notes (Signed)
Patient ID: [name] [name], female   DOB: 1940-12-05, 78 y.o.   MRN: [id number]   HPI: [name] [name] is a 78 y.o.-year-old female, returning for follow-up for DM2, dx in the 1980s, insulin-dependent since ~2010, uncontrolled, with complications (dCHF, Cerebrovascular disease - history of CVA 2002, CKD, DR, PN).  Last visit 4 months ago.  She had an episode of near syncope recently.  Her sugars were high at that time, 295, and potassium was low, at 3.0.  At this visit, she forgot her sugar log at home and she unfortunately cannot remember the values very well.  Reviewed HbA1c levels: Lab Results  Component Value Date   HGBA1C 8.9 (H) 02/13/2019   HGBA1C 9.4 (A) 10/09/2018   HGBA1C 9.6 (A) 06/14/2018   HGBA1C 8.4 (A) 02/26/2018  09/10/2017: HbA1c 10.4%  Pt is on a regimen of: - Metformin ER 750 mg with dinner - Glipizide ER 2.5 mg before breakfast - Tresiba 28 units daily at night >> in am - now daily - Ozempic 0.5 mg weekly-added 06/2018 In 02/2018: We stopped Onglyza since this was expensive and started glipizide ER low-dose. She was previously also on Levemir 35 units daily.  Pt checks her sugars once a day (forgot log and cannot remember correctly): - am: 65, 79-250 >> 65, 71-168, 186-293 when forgets insulin >> 68 x1, 69 x1, 89-120s, 133, 200s (when forgetting meds) - 2h after b'fast: n/c >> 269 >> n/c - before lunch: n/c >> ?, 190 - 2h after lunch: n/c >> 199 >> n/c - before dinner: n/c - 2h after dinner: n/c - bedtime: n/c - nighttime: n/c Lowest sugar was 50s in Spring 2019  >> 57 >> 65 >> 68; she has hypoglycemia awareness in the 46s. Highest sugar was  441 (missed insulin) >> 285.  Glucometer: Accu-Chek [employer]iva  Pt's meals are - eats 2x a day: - Breakfast: oatmeal + bacon - Lunch: veggies and a piece of chicken - small meal - Dinner: chicken, veggies - Snacks: apples, oranges  -+ CKD, last BUN/creatinine:  Lab Results  Component Value Date   BUN 32 (H)  02/12/2019   BUN 35 (H) 10/09/2018   CREATININE 1.56 (H) 02/12/2019   CREATININE 1.95 (H) 10/09/2018  09/10/2017: 27/1.52, GFR 37 On telmisartan 80  << valsartan 320.  -+ HL; lipids: Lab Results  Component Value Date   CHOL 158 10/09/2018   HDL 36.50 (L) 10/09/2018   LDLCALC 84 10/09/2018   TRIG 189.0 (H) 10/09/2018   CHOLHDL 4 10/09/2018  08/14/2017: 151/187/44/70 On Crestor 10, Vascepa.  - last eye exam was in 11/2018: + DR. She finished IO injections q6 weeks.  Her stroke affected her left eye vision.   -No numbness but has tingling in her feet  On ASA 325.  Pt has FH of DM in father.  Latest TSH was normal: 06/07/2016: 2.110. No results found for: TSH  ROS: Constitutional: no weight gain/no weight loss, no fatigue, no subjective hyperthermia, no subjective hypothermia Eyes: no blurry vision, no xerophthalmia ENT: no sore throat, no nodules palpated in neck, no dysphagia, no odynophagia, no hoarseness Cardiovascular: no CP/no SOB/no palpitations/no leg swelling Respiratory: no cough/no SOB/no wheezing Gastrointestinal: no N/no V/no D/no C/no acid reflux Musculoskeletal: no muscle aches/no joint aches Skin: no rashes, no hair loss Neurological: no tremors/+ numbness/+ tingling - R fingertips/no dizziness  I reviewed pt's medications, allergies, PMH, social hx, family hx, and changes were documented in the history of present illness. Otherwise, unchanged from  my initial visit note.  Past Medical History:  Diagnosis Date  . Diabetes mellitus ([place])   . H/O: CVA (cerebrovascular accident) 2002  . HTN (hypertension)   . Hyperlipidemia   . Pulmonary HTN ([place])    mild by echo   Past Surgical History:  Procedure Laterality Date  . ABDOMINAL HYSTERECTOMY    . CARDIAC CATHETERIZATION  03/17/2003   Normal, patent coronary arteries. Normal LV systolic function  . CARDIOVASCULAR STRESS TEST  08/28/2006   Small sized, moderate ischemiain the Basal Inferoseptal, Basal Inferior,  Mid Inferior and Apical Inferior regions  . TRANSTHORACIC ECHOCARDIOGRAM  02/17/2010   EF >55%, moderate-severely thickened septum, mild-moderate mitral annular calcification   Social History   Socioeconomic History  . Marital status: Married    Spouse name: Not on file  . Number of children: Not on file  . Years of education: Not on file  . Highest education level: Not on file  Occupational History  . Occupation: retired  Scientific laboratory technician  . Financial resource strain: Not hard at all  . Food insecurity    Worry: Never true    Inability: Never true  . Transportation needs    Medical: No    Non-medical: No  Tobacco Use  . Smoking status: Never Smoker  . Smokeless tobacco: Never Used  Substance and Sexual Activity  . Alcohol use: No  . Drug use: Never  . Sexual activity: Not Currently  Lifestyle  . Physical activity    Days per week: 7 days    Minutes per session: 10 min  . Stress: Not at all  Relationships  . Social Herbalist on phone: Not on file    Gets together: Not on file    Attends religious service: Not on file    Active member of club or organization: Not on file    Attends meetings of clubs or organizations: Not on file    Relationship status: Not on file  . Intimate partner violence    Fear of current or ex partner: No    Emotionally abused: No    Physically abused: No    Forced sexual activity: No  Other Topics Concern  . Not on file  Social History Narrative  . Not on file   Current Outpatient Medications on File Prior to Visit  Medication Sig Dispense Refill  . ACCU-CHEK AVIVA PLUS test strip TEST 3 TIMES DAILY (Patient not taking: Reported on 02/12/2019) 100 each 11  . amLODipine (NORVASC) 10 MG tablet TAKE 1 TABLET BY MOUTH EVERY DAY 90 tablet 1  . aspirin 325 MG EC tablet Take 325 mg by mouth daily.    . B-D UF III MINI PEN NEEDLES 31G X 5 MM MISC Use as directed once per day with Victoza and 2 times per day with Levemir 200 each 1  .  glipiZIDE (GLUCOTROL XL) 2.5 MG 24 hr tablet TAKE 1 TABLET(2.5 MG) BY MOUTH DAILY WITH BREAKFAST 90 tablet 3  . hydrALAZINE (APRESOLINE) 25 MG tablet TAKE 1 TABLET(25 MG) BY MOUTH TWICE DAILY 180 tablet 1  . hydrOXYzine (ATARAX/VISTARIL) 25 MG tablet Take 1 tablet by mouth at bedtime as needed. 1-2 tablets at bedtime prn    . Icosapent Ethyl (VASCEPA) 1 g CAPS Take 1 capsule by mouth at bedtime.    . Lancets (ACCU-CHEK SOFT TOUCH) lancets Check blood sugars 3 times daily E11.65 100 each 12  . metFORMIN (GLUCOPHAGE-XR) 750 MG 24 hr tablet TAKE 1 TABLET BY  MOUTH TWICE DAILY WITH FOOD (Patient taking differently: Take 750 mg by mouth daily with breakfast. ) 180 tablet 1  . metoprolol succinate (TOPROL-XL) 25 MG 24 hr tablet Take 1 tablet (25 mg total) by mouth daily. 90 tablet 1  . metroNIDAZOLE (METROCREAM) 0.75 % cream Apply 1 application topically daily as needed.    . Multiple Vitamin (MULTIVITAMIN) tablet Take 1 tablet by mouth daily.    . potassium chloride SA (KLOR-CON) 20 MEQ tablet Take 1 tablet (20 mEq total) by mouth daily. 7 tablet 0  . rosuvastatin (CRESTOR) 10 MG tablet TAKE 1 TABLET BY MOUTH EVERY DAY 90 tablet 1  . Semaglutide,0.25 or 0.5MG /DOS, (OZEMPIC, 0.25 OR 0.5 MG/DOSE,) 2 MG/1.5ML SOPN Inject 0.5 mg into the skin once a week. 2 pen 5  . telmisartan-hydrochlorothiazide (MICARDIS HCT) 80-25 MG tablet TAKE 1 TABLET BY MOUTH EVERY DAY 90 tablet 1  . timolol (BETIMOL) 0.5 % ophthalmic solution Place 1 drop into both eyes at bedtime.    . TRESIBA FLEXTOUCH 200 UNIT/ML SOPN Inject 28 Units into the skin at bedtime. 15 pen 5   No current facility-administered medications on file prior to visit.    Allergies  Allergen Reactions  . Brimonidine Other (See Comments)    Eyes swell and itch  . Dorzolamide     Other reaction(s): Other (See Comments) Red, inflamed eyelid, dermatitis   . Timolol Other (See Comments)    Caused eyes to swell and itch   Family History  Problem Relation  Age of Onset  . Hypertension Mother   . Hypertension Father   . Heart Problems Father   . Hypertension Sister   . Diabetes Sister   . Hypertension Brother   . Diabetes Brother     PE: BP (!) 150/60   Pulse 72   Ht 5' 1.75" (1.568 m)   Wt 147 lb (66.7 kg)   SpO2 97%   BMI 27.10 kg/m   Wt Readings from Last 3 Encounters:  02/14/19 147 lb (66.7 kg)  02/13/19 145 lb 12.8 oz (66.1 kg)  02/12/19 144 lb (65.3 kg)   Constitutional: Slightly overweight, in NAD Eyes: PERRLA, EOMI, no exophthalmos ENT: moist mucous membranes, no thyromegaly, no cervical lymphadenopathy Cardiovascular: RRR, No MRG Respiratory: CTA B Gastrointestinal: abdomen soft, NT, ND, BS+ Musculoskeletal: no deformities, strength intact in all 4 Skin: moist, warm, no rashes Neurological: no tremor with outstretched hands, DTR normal in all 4  ASSESSMENT: 1. DM2, insulin-dependent, uncontrolled, with long-term complications - dCHF - Cerebrovascular disease, history of CVA 2002 - CKD - DR - PN  2. HL  3.  Overweight  PLAN:  1. Patient with longstanding, uncontrolled, type 2 diabetes, on oral antidiabetic regimen with metformin and sulfonylurea, also weekly GLP-1 receptor agonist and daily long-acting insulin.  She has a history of CHF so ideally she would also be on an SGLT2 inhibitor but her GFR was too low at last visit and we cannot use it.  At last visit, sugars were higher in the morning especially when she forgot insulin, which was quite frequently.  I again advised her to move [place] in the morning and set alarms on her phone to take it.  At that time, she was not checking sugars later in the day and I strongly advised her to do so and also put comments in her log whenever she sees sugars that are out of the ordinary for her.  We also discussed about improving diet especially reducing eating out. -At  this visit, we reviewed together her latest HbA1c from yesterday which was lower, but still high, at 8.9%.   She did not bring her log and she does not remember her sugars very well.  In the morning, however, she had to slightly low blood sugars, in the 60s and she tells me that she feels that her dose [name] is too high.  We will back off the dose slightly especially as, reportedly, her sugars in the morning have improved significantly.  However, she still has sugars in the 200s when she is missing the [place] dose, so I again strongly advised her to set alarms on her phone to remember to take the [place].  Otherwise, I would not change her regimen since I do not have further data. - I suggested to:  Patient Instructions  Please continue: - Metformin ER 750 mg at dinnertime - Glipizide ER 2.5 mg before breakfast - Ozempic 0.5 mg weekly  Please decrease: - Tresiba 26 units daily  Please come back for a follow-up appointment in 3 to 4 months.  - advised to check sugars at different times of the day - 1-2x a day, rotating check times - advised for yearly eye exams >> she is UTD - return to clinic in 3-4 months  2. HL - Reviewed latest lipid panel from this summer: LDL higher than goal (<70), triglycerides high, HDL low Lab Results  Component Value Date   CHOL 158 10/09/2018   HDL 36.50 (L) 10/09/2018   LDLCALC 84 10/09/2018   TRIG 189.0 (H) 10/09/2018   CHOLHDL 4 10/09/2018  - Continues Crestor 10 and Vascepa without side effects.  3.  Overweight -No significant weight loss since last visit -Continue Ozempic which should also help with weight loss   Philemon Kingdom, MD PhD [facility] Endocrinology

## 2019-02-14 NOTE — Patient Instructions (Signed)
Please continue: - Metformin ER 750 mg at dinnertime - Glipizide ER 2.5 mg before breakfast - Ozempic 0.5 mg weekly  Please decrease: - Tresiba 26 units daily  Please come back for a follow-up appointment in 3 to 4 months.

## 2019-02-27 ENCOUNTER — Other Ambulatory Visit: Payer: Self-pay

## 2019-02-27 ENCOUNTER — Other Ambulatory Visit: Payer: Self-pay | Admitting: Internal Medicine

## 2019-02-27 ENCOUNTER — Other Ambulatory Visit: Payer: Medicare Other

## 2019-02-27 DIAGNOSIS — E876 Hypokalemia: Secondary | ICD-10-CM | POA: Diagnosis not present

## 2019-02-28 LAB — BMP8+EGFR
BUN/Creatinine Ratio: 16 (ref 12–28)
BUN: 28 mg/dL — ABNORMAL HIGH (ref 8–27)
CO2: 24 mmol/L (ref 20–29)
Calcium: 9.9 mg/dL (ref 8.7–10.3)
Chloride: 103 mmol/L (ref 96–106)
Creatinine, Ser: 1.73 mg/dL — ABNORMAL HIGH (ref 0.57–1.00)
GFR calc Af Amer: 32 mL/min/{1.73_m2} — ABNORMAL LOW (ref >59)
GFR calc non Af Amer: 27 mL/min/{1.73_m2} — ABNORMAL LOW (ref >59)
Glucose: 111 mg/dL — ABNORMAL HIGH (ref 65–99)
Potassium: 4.5 mmol/L (ref 3.5–5.2)
Sodium: 142 mmol/L (ref 134–144)

## 2019-03-09 NOTE — Progress Notes (Signed)
Subjective:     Patient ID: [name] [name] , female    DOB: May 04, 1940 , 78 y.o.   MRN: [id number]   Chief Complaint  Patient presents with  . Hypertension  . ER f/u  . Immunizations    tdap / pneumonia    HPI  She is here today for BP check. She adds that she was seen at [facility] on 11/4 for near syncopal episode.  She reports that she began to feel lightheaded while in the kitchen and had to be helped to the ground by her husband.  Did not fall or lose consciousness.  Denies any preceding chest pain or shortness of breath.  States she had not eaten, or had anything to drink prior to this episode.  She had yet to take her meds as well. Workup negative in ER. She was given gentle hydration with an improvement in her sx. She was found to have hypokalemia which was repleted.  She has not had any recurrent symptoms since being discharged home.   Hypertension This is a chronic problem. The current episode started more than 1 year ago. The problem has been gradually improving since onset. The problem is controlled. Pertinent negatives include no blurred vision, chest pain, palpitations or shortness of breath. The current treatment provides moderate improvement. Compliance problems include exercise.  Hypertensive end-organ damage includes kidney disease.     Past Medical History:  Diagnosis Date  . Diabetes mellitus ([place])   . H/O: CVA (cerebrovascular accident) 2002  . HTN (hypertension)   . Hyperlipidemia   . Pulmonary HTN (HCC)    mild by echo     Family History  Problem Relation Age of Onset  . Hypertension Mother   . Hypertension Father   . Heart Problems Father   . Hypertension Sister   . Diabetes Sister   . Hypertension Brother   . Diabetes Brother      Current Outpatient Medications:  .  amLODipine (NORVASC) 10 MG tablet, TAKE 1 TABLET BY MOUTH EVERY DAY, Disp: 90 tablet, Rfl: 1 .  aspirin 325 MG EC tablet, Take 325 mg by mouth daily., Disp: , Rfl:  .  B-D UF III  MINI PEN NEEDLES 31G X 5 MM MISC, Use as directed once per day with Victoza and 2 times per day with Levemir, Disp: 200 each, Rfl: 1 .  hydrALAZINE (APRESOLINE) 25 MG tablet, TAKE 1 TABLET(25 MG) BY MOUTH TWICE DAILY, Disp: 180 tablet, Rfl: 1 .  hydrOXYzine (ATARAX/VISTARIL) 25 MG tablet, Take 1 tablet by mouth at bedtime as needed. 1-2 tablets at bedtime prn, Disp: , Rfl:  .  Icosapent Ethyl (VASCEPA) 1 g CAPS, Take 1 capsule by mouth at bedtime., Disp: , Rfl:  .  Lancets (ACCU-CHEK SOFT TOUCH) lancets, Check blood sugars 3 times daily E11.65, Disp: 100 each, Rfl: 12 .  metFORMIN (GLUCOPHAGE-XR) 750 MG 24 hr tablet, TAKE 1 TABLET BY MOUTH TWICE DAILY WITH FOOD (Patient taking differently: Take 750 mg by mouth daily with breakfast. ), Disp: 180 tablet, Rfl: 1 .  Multiple Vitamin (MULTIVITAMIN) tablet, Take 1 tablet by mouth daily., Disp: , Rfl:  .  potassium chloride SA (KLOR-CON) 20 MEQ tablet, Take 1 tablet (20 mEq total) by mouth daily., Disp: 7 tablet, Rfl: 0 .  rosuvastatin (CRESTOR) 10 MG tablet, TAKE 1 TABLET BY MOUTH EVERY DAY, Disp: 90 tablet, Rfl: 1 .  Semaglutide,0.25 or 0.5MG/DOS, (OZEMPIC, 0.25 OR 0.5 MG/DOSE,) 2 MG/1.5ML SOPN, Inject 0.5 mg into the skin  once a week., Disp: 2 pen, Rfl: 5 .  telmisartan-hydrochlorothiazide (MICARDIS HCT) 80-25 MG tablet, TAKE 1 TABLET BY MOUTH EVERY DAY, Disp: 90 tablet, Rfl: 1 .  timolol (BETIMOL) 0.5 % ophthalmic solution, Place 1 drop into both eyes at bedtime., Disp: , Rfl:  .  TRESIBA FLEXTOUCH 200 UNIT/ML SOPN, Inject 28 Units into the skin at bedtime., Disp: 15 pen, Rfl: 5 .  ACCU-CHEK AVIVA PLUS test strip, TEST 3 TIMES DAILY (Patient not taking: Reported on 02/12/2019), Disp: 100 each, Rfl: 11 .  glipiZIDE (GLUCOTROL XL) 2.5 MG 24 hr tablet, TAKE 1 TABLET(2.5 MG) BY MOUTH DAILY WITH BREAKFAST, Disp: 90 tablet, Rfl: 3 .  metoprolol succinate (TOPROL-XL) 25 MG 24 hr tablet, Take 1 tablet (25 mg total) by mouth daily., Disp: 90 tablet, Rfl: 1 .   metroNIDAZOLE (METROCREAM) 0.75 % cream, Apply 1 application topically daily as needed., Disp: , Rfl:    Allergies  Allergen Reactions  . Brimonidine Other (See Comments)    Eyes swell and itch  . Dorzolamide     Other reaction(s): Other (See Comments) Red, inflamed eyelid, dermatitis   . Timolol Other (See Comments)    Caused eyes to swell and itch     Review of Systems  Constitutional: Negative.   Eyes: Negative for blurred vision.  Respiratory: Negative.  Negative for shortness of breath.   Cardiovascular: Negative.  Negative for chest pain and palpitations.  Gastrointestinal: Negative.   Neurological: Negative.   Psychiatric/Behavioral: Negative.      Today's Vitals   02/13/19 1027  BP: 132/70  Pulse: 73  Temp: 98.4 F (36.9 C)  TempSrc: Oral  Weight: 145 lb 12.8 oz (66.1 kg)  Height: 5' 2"  (1.575 m)  PainSc: 0-No pain   Body mass index is 26.67 kg/m.   Objective:  Physical Exam Vitals signs and nursing note reviewed.  Constitutional:      Appearance: Normal appearance.  HENT:     Head: Normocephalic and atraumatic.  Cardiovascular:     Rate and Rhythm: Normal rate and regular rhythm.     Heart sounds: Normal heart sounds.  Pulmonary:     Effort: Pulmonary effort is normal.     Breath sounds: Normal breath sounds.  Skin:    General: Skin is warm.  Neurological:     General: No focal deficit present.     Mental Status: She is alert.  Psychiatric:        Mood and Affect: Mood normal.        Behavior: Behavior normal.         Assessment And Plan:     1. Hypertensive nephropathy  Chronic, controlled. She will continue with current meds. She is encouraged to avoid adding salt to her foods.   2. Chronic renal disease, stage II  Chronic. Importance of adequate hydration was discussed with the patient.   3. Near syncope  ER records reviewed in full detail. She is encouraged to try to eat shortly after awakening and to stay well hydrated.   4.  Type 2 diabetes mellitus with stage 2 chronic kidney disease, without long-term current use of insulin (HCC)  Chronic. She has upcoming f/u with endocrinologist. I will check hba1c today.   - Hemoglobin A1c  5. Hypokalemia  She is encouraged to rto in two weeks for repeat BMP testing. She is also encouraged to increase her intake of potassium rich foods.   - BMP8+EGFR; Future  6. Overweight with body mass index (BMI) of  26 to 26.9 in adult  Her weight is stable. She is encouraged to incorporate more exercise into her daily routine.   Maximino Greenland, MD    THE PATIENT IS ENCOURAGED TO PRACTICE SOCIAL DISTANCING DUE TO THE COVID-19 PANDEMIC.

## 2019-03-20 DIAGNOSIS — E11311 Type 2 diabetes mellitus with unspecified diabetic retinopathy with macular edema: Secondary | ICD-10-CM | POA: Diagnosis not present

## 2019-03-20 DIAGNOSIS — H401133 Primary open-angle glaucoma, bilateral, severe stage: Secondary | ICD-10-CM | POA: Diagnosis not present

## 2019-03-20 DIAGNOSIS — H34812 Central retinal vein occlusion, left eye, with macular edema: Secondary | ICD-10-CM | POA: Diagnosis not present

## 2019-03-20 DIAGNOSIS — E113311 Type 2 diabetes mellitus with moderate nonproliferative diabetic retinopathy with macular edema, right eye: Secondary | ICD-10-CM | POA: Diagnosis not present

## 2019-03-20 DIAGNOSIS — H5440 Blindness, one eye, unspecified eye: Secondary | ICD-10-CM | POA: Diagnosis not present

## 2019-04-30 ENCOUNTER — Encounter: Payer: Self-pay | Admitting: Internal Medicine

## 2019-05-03 ENCOUNTER — Other Ambulatory Visit: Payer: Self-pay | Admitting: Internal Medicine

## 2019-05-14 ENCOUNTER — Ambulatory Visit (INDEPENDENT_AMBULATORY_CARE_PROVIDER_SITE_OTHER): Payer: Medicare Other | Admitting: Podiatry

## 2019-05-14 ENCOUNTER — Other Ambulatory Visit: Payer: Self-pay

## 2019-05-14 ENCOUNTER — Encounter: Payer: Self-pay | Admitting: Podiatry

## 2019-05-14 ENCOUNTER — Ambulatory Visit (INDEPENDENT_AMBULATORY_CARE_PROVIDER_SITE_OTHER): Payer: Medicare Other

## 2019-05-14 VITALS — Temp 97.2°F

## 2019-05-14 DIAGNOSIS — G629 Polyneuropathy, unspecified: Secondary | ICD-10-CM

## 2019-05-14 DIAGNOSIS — E1149 Type 2 diabetes mellitus with other diabetic neurological complication: Secondary | ICD-10-CM | POA: Diagnosis not present

## 2019-05-14 DIAGNOSIS — M21619 Bunion of unspecified foot: Secondary | ICD-10-CM | POA: Diagnosis not present

## 2019-05-14 DIAGNOSIS — M2042 Other hammer toe(s) (acquired), left foot: Secondary | ICD-10-CM

## 2019-05-14 DIAGNOSIS — M2041 Other hammer toe(s) (acquired), right foot: Secondary | ICD-10-CM | POA: Diagnosis not present

## 2019-05-14 DIAGNOSIS — E1140 Type 2 diabetes mellitus with diabetic neuropathy, unspecified: Secondary | ICD-10-CM

## 2019-05-14 NOTE — Progress Notes (Signed)
Subjective:   Patient ID: [name] [name], female   DOB: 79 y.o.   MRN: [id number]   HPI Patient states she is developed more tingling and burning in her right foot and is concerned about that and states that she is taking reasonable care of her sugar but does take insulin and last A1c was 9.  Patient states that she also has bunions and hammertoes   Review of Systems  All other systems reviewed and are negative.       Objective:  Physical Exam Vitals and nursing note reviewed.  Constitutional:      Appearance: She is well-developed.  Pulmonary:     Effort: Pulmonary effort is normal.  Musculoskeletal:        General: Normal range of motion.  Skin:    General: Skin is warm.  Neurological:     Mental Status: She is alert.     Vascular status mildly diminished but intact with neurological diminished but sharp dull vibratory bilateral to mild nature.  Patient is found to have structural bunion deformity hammertoe deformity and complains of tingling of the right foot worse at nighttime but not keeping her from sleeping currently.  Patient has good digital perfusion well oriented x3       Assessment:  Appears to be moderate diabetic neuropathy right over left with patient having structural deformity bilateral foot hammertoe and bunion deformity     Plan:  H&P conditions reviewed and I discussed treatment options.  We discussed possible medication but I am going to hold off currently as I would like her not to have to start that if possible and I have recommended diabetic shoes to try to provide for better cushion and to take some of the pressure off of her feet.  Patient will be seen back when we get approval for this and I educated her on usage of diabetic shoes  X-rays indicate structural bunion deformity with hammertoe deformity right foot with moderate osteoporosis osteoarthritis

## 2019-05-21 ENCOUNTER — Other Ambulatory Visit: Payer: Self-pay | Admitting: Internal Medicine

## 2019-05-21 DIAGNOSIS — Z1231 Encounter for screening mammogram for malignant neoplasm of breast: Secondary | ICD-10-CM

## 2019-05-22 ENCOUNTER — Ambulatory Visit: Payer: Medicare Other | Admitting: Internal Medicine

## 2019-07-03 ENCOUNTER — Other Ambulatory Visit: Payer: Self-pay

## 2019-07-03 ENCOUNTER — Encounter: Payer: Self-pay | Admitting: Internal Medicine

## 2019-07-03 ENCOUNTER — Ambulatory Visit: Payer: Medicare Other | Admitting: Internal Medicine

## 2019-07-03 VITALS — BP 150/58 | HR 88 | Ht 61.75 in | Wt 148.0 lb

## 2019-07-03 DIAGNOSIS — E663 Overweight: Secondary | ICD-10-CM | POA: Diagnosis not present

## 2019-07-03 DIAGNOSIS — E1159 Type 2 diabetes mellitus with other circulatory complications: Secondary | ICD-10-CM

## 2019-07-03 DIAGNOSIS — E785 Hyperlipidemia, unspecified: Secondary | ICD-10-CM

## 2019-07-03 DIAGNOSIS — E1169 Type 2 diabetes mellitus with other specified complication: Secondary | ICD-10-CM | POA: Diagnosis not present

## 2019-07-03 DIAGNOSIS — Z794 Long term (current) use of insulin: Secondary | ICD-10-CM | POA: Diagnosis not present

## 2019-07-03 DIAGNOSIS — E1165 Type 2 diabetes mellitus with hyperglycemia: Secondary | ICD-10-CM | POA: Diagnosis not present

## 2019-07-03 DIAGNOSIS — IMO0002 Reserved for concepts with insufficient information to code with codable children: Secondary | ICD-10-CM

## 2019-07-03 LAB — POCT GLYCOSYLATED HEMOGLOBIN (HGB A1C): Hemoglobin A1C: 8.8 % — AB (ref 4.0–5.6)

## 2019-07-03 MED ORDER — OZEMPIC (1 MG/DOSE) 2 MG/1.5ML ~~LOC~~ SOPN: 1.0000 mg | PEN_INJECTOR | SUBCUTANEOUS | 3 refills | Status: DC

## 2019-07-03 NOTE — Patient Instructions (Addendum)
Please continue: - Metformin ER 750 mg at dinnertime - Glipizide ER 2.5 mg before breakfast  Please increase: - Ozempic 1 mg weekly  Please use: - Tresiba 20 units and move this to morning  Let me know if the sugars in am decrease to <70.  Please come back for a follow-up appointment in 3 to 4 months.

## 2019-07-03 NOTE — Progress Notes (Signed)
Patient ID: [name] [name], female   DOB: 06-03-1940, 79 y.o.   MRN: [id number]   This visit occurred during the SARS-CoV-2 public health emergency.  Safety protocols were in place, including screening questions prior to the visit, additional usage of staff PPE, and extensive cleaning of exam room while observing appropriate contact time as indicated for disinfecting solutions.   HPI: [name] [name] is a 79 y.o.-year-old female, returning for follow-up for DM2, dx in the 1980s, insulin-dependent since ~2010, uncontrolled, with complications (dCHF, Cerebrovascular disease - history of CVA 2002, CKD, DR, PN).  Last visit 4.5 months ago.  Reviewed HbA1c levels: Lab Results  Component Value Date   HGBA1C 8.9 (H) 02/13/2019   HGBA1C 9.4 (A) 10/09/2018   HGBA1C 9.6 (A) 06/14/2018   HGBA1C 8.4 (A) 02/26/2018  09/10/2017: HbA1c 10.4%  Pt is on a regimen of: - Metformin ER 750 mg with dinner - Glipizide ER 2.5 mg before breakfast - Tresiba 28 >> 26 >> 20 units at night - Ozempic 0.5 mg weekly-added 06/2018 In 02/2018: We stopped Onglyza since this was expensive and started glipizide ER low-dose. She was previously also on Levemir 35 units daily.  Pt checks her sugars once a day: - am: 68 x1, 69 x1, 89-120s, 133, 200s w/o insulin >> 59-141, 197-224 if she forgets insulin - 2h after b'fast: n/c >> 269 >> n/c - before lunch: n/c >> ?, 190 - 2h after lunch: n/c >> 199 >> n/c - before dinner: n/c - 2h after dinner: n/c - bedtime: n/c - nighttime: n/c Lowest sugar was 50s in Spring 2019  >> 57 >> 65 >> 68 >> 59; she has hypoglycemia awareness in the 16s. Highest sugar was  441 (missed insulin) >> 285 >> 224.  Glucometer: Accu-Chek Aviva  Pt's meals are - eats 2x a day: - Breakfast: oatmeal + bacon - Lunch: veggies and a piece of chicken - small meal - Dinner: chicken, veggies - Snacks: apples, oranges  -+ CKD, last BUN/creatinine:  Lab Results  Component Value Date   BUN 28 (H)  02/27/2019   BUN 32 (H) 02/12/2019   CREATININE 1.73 (H) 02/27/2019   CREATININE 1.56 (H) 02/12/2019  09/10/2017: 27/1.52, GFR 37 Previously on valsartan 320, now telmisartan 80.  -+ HL; lipids: Lab Results  Component Value Date   CHOL 158 10/09/2018   HDL 36.50 (L) 10/09/2018   LDLCALC 84 10/09/2018   TRIG 189.0 (H) 10/09/2018   CHOLHDL 4 10/09/2018  08/14/2017: 151/187/44/70 On Crestor 10, Vascepa.  - last eye exam was in 03/2019: + Moderate NPDR OD + macular edema. She finished IO injections q6 weeks.  Her stroke affected her left eye vision.   -No numbness but has tingling in her feet  On ASA 325.  Pt has FH of DM in father.  Latest TSH was normal: 06/07/2016: 2.110. No results found for: TSH  ROS: Constitutional: no weight gain/no weight loss, no fatigue, no subjective hyperthermia, no subjective hypothermia Eyes: no blurry vision, no xerophthalmia ENT: no sore throat, no nodules palpated in neck, no dysphagia, no odynophagia, no hoarseness Cardiovascular: no CP/no SOB/no palpitations/no leg swelling Respiratory: no cough/no SOB/no wheezing Gastrointestinal: no N/no V/no D/no C/no acid reflux Musculoskeletal: no muscle aches/no joint aches Skin: no rashes, no hair loss Neurological: no tremors/+ numbness and tingling in right fingertips/no dizziness  I reviewed pt's medications, allergies, PMH, social hx, family hx, and changes were documented in the history of present illness. Otherwise, unchanged from my initial  visit note.  Past Medical History:  Diagnosis Date  . Diabetes mellitus ([place])   . H/O: CVA (cerebrovascular accident) 2002  . HTN (hypertension)   . Hyperlipidemia   . Pulmonary HTN ([place])    mild by echo   Past Surgical History:  Procedure Laterality Date  . ABDOMINAL HYSTERECTOMY    . CARDIAC CATHETERIZATION  03/17/2003   Normal, patent coronary arteries. Normal LV systolic function  . CARDIOVASCULAR STRESS TEST  08/28/2006   Small sized, moderate  ischemiain the Basal Inferoseptal, Basal Inferior, Mid Inferior and Apical Inferior regions  . TRANSTHORACIC ECHOCARDIOGRAM  02/17/2010   EF >55%, moderate-severely thickened septum, mild-moderate mitral annular calcification   Social History   Socioeconomic History  . Marital status: Married    Spouse name: Not on file  . Number of children: Not on file  . Years of education: Not on file  . Highest education level: Not on file  Occupational History  . Occupation: retired  Tobacco Use  . Smoking status: Never Smoker  . Smokeless tobacco: Never Used  Substance and Sexual Activity  . Alcohol use: No  . Drug use: Never  . Sexual activity: Not Currently  Other Topics Concern  . Not on file  Social History Narrative  . Not on file   Social Determinants of Health   Financial Resource Strain: Low Risk   . Difficulty of Paying Living Expenses: Not hard at all  Food Insecurity: No Food Insecurity  . Worried About Charity fundraiser in the Last Year: Never true  . Ran Out of Food in the Last Year: Never true  Transportation Needs: No Transportation Needs  . Lack of Transportation (Medical): No  . Lack of Transportation (Non-Medical): No  Physical Activity: Insufficiently Active  . Days of Exercise per Week: 7 days  . Minutes of Exercise per Session: 10 min  Stress: No Stress Concern Present  . Feeling of Stress : Not at all  Social Connections:   . Frequency of Communication with Friends and Family:   . Frequency of Social Gatherings with Friends and Family:   . Attends Religious Services:   . Active Member of Clubs or Organizations:   . Attends Archivist Meetings:   Marland Kitchen Marital Status:   Intimate Partner Violence: Not At Risk  . Fear of Current or Ex-Partner: No  . Emotionally Abused: No  . Physically Abused: No  . Sexually Abused: No   Current Outpatient Medications on File Prior to Visit  Medication Sig Dispense Refill  . ACCU-CHEK AVIVA PLUS test strip TEST  3 TIMES DAILY 100 each 11  . amLODipine (NORVASC) 10 MG tablet TAKE 1 TABLET BY MOUTH EVERY DAY 90 tablet 1  . aspirin 325 MG EC tablet Take 325 mg by mouth daily.    . B-D UF III MINI PEN NEEDLES 31G X 5 MM MISC Use as directed once per day with Victoza and 2 times per day with Levemir 200 each 1  . glipiZIDE (GLUCOTROL XL) 2.5 MG 24 hr tablet TAKE 1 TABLET(2.5 MG) BY MOUTH DAILY WITH BREAKFAST 90 tablet 3  . hydrALAZINE (APRESOLINE) 25 MG tablet TAKE 1 TABLET(25 MG) BY MOUTH TWICE DAILY 180 tablet 1  . hydrOXYzine (ATARAX/VISTARIL) 25 MG tablet Take 1 tablet by mouth at bedtime as needed. 1-2 tablets at bedtime prn    . Lancets (ACCU-CHEK SOFT TOUCH) lancets Check blood sugars 3 times daily E11.65 100 each 12  . latanoprost (XALATAN) 0.005 % ophthalmic solution  1 drop at bedtime.    . metFORMIN (GLUCOPHAGE-XR) 750 MG 24 hr tablet Take 1 tablet (750 mg total) by mouth daily with breakfast. 90 tablet 1  . Multiple Vitamin (MULTIVITAMIN) tablet Take 1 tablet by mouth daily.    . potassium chloride SA (KLOR-CON) 20 MEQ tablet Take 1 tablet (20 mEq total) by mouth daily. 7 tablet 0  . rosuvastatin (CRESTOR) 10 MG tablet TAKE 1 TABLET BY MOUTH EVERY DAY 90 tablet 1  . Semaglutide,0.25 or 0.5MG /DOS, (OZEMPIC, 0.25 OR 0.5 MG/DOSE,) 2 MG/1.5ML SOPN Inject 0.5 mg into the skin once a week. 2 pen 5  . timolol (BETIMOL) 0.5 % ophthalmic solution Place 1 drop into both eyes at bedtime.    . TRESIBA FLEXTOUCH 200 UNIT/ML SOPN Inject 28 Units into the skin at bedtime. 15 pen 5   No current facility-administered medications on file prior to visit.   Allergies  Allergen Reactions  . Brimonidine Other (See Comments)    Eyes swell and itch  . Dorzolamide     Other reaction(s): Other (See Comments) Red, inflamed eyelid, dermatitis   . Timolol Other (See Comments)    Caused eyes to swell and itch   Family History  Problem Relation Age of Onset  . Hypertension Mother   . Hypertension Father   . Heart  Problems Father   . Hypertension Sister   . Diabetes Sister   . Hypertension Brother   . Diabetes Brother    PE: BP (!) 150/58   Pulse 88   Ht 5' 1.75" (1.568 m)   Wt 148 lb (67.1 kg)   SpO2 97%   BMI 27.29 kg/m   Wt Readings from Last 3 Encounters:  07/03/19 148 lb (67.1 kg)  02/14/19 147 lb (66.7 kg)  02/13/19 145 lb 12.8 oz (66.1 kg)   Constitutional: Slightly overweight, in NAD Eyes: PERRLA, EOMI, no exophthalmos ENT: moist mucous membranes, no thyromegaly, no cervical lymphadenopathy Cardiovascular: RRR, No MRG Respiratory: CTA B Gastrointestinal: abdomen soft, NT, ND, BS+ Musculoskeletal: no deformities, strength intact in all 4 Skin: moist, warm, no rashes Neurological: no tremor with outstretched hands, DTR normal in all 4  ASSESSMENT: 1. DM2, insulin-dependent, uncontrolled, with long-term complications - dCHF - Cerebrovascular disease, history of CVA 2002 - CKD - DR - PN  2. HL  3.  Overweight  PLAN:  1. Patient with longstanding uncontrolled, type 2 diabetes, on oral antidiabetic regimen with Metformin, sulfonylurea, long-acting insulin and weekly GLP-1 receptor agonist.  She has a history of CHF so ideally she would be on initially 3 L but her GFR was too low and we could not use it. -At last visits we discussed about the importance of setting alarms to not forget to see find the morning and also improving her diet especially reducing eating out. -At last visit, HbA1c was lower, at 8.9% but she did not bring her sugar log and she could not remember the CBGs very well.  She still had sugars in the 200s when she was missing [place] so again I advised her to set alarms on her phone to remember to take it.  We also decreased the dose of Tresiba since, when she was taking it, sugars are much better compared to previously.  Otherwise we did not change the regimen as I did not have enough data. -At this visit, sugars are lower in the morning if she takes her insulin  and she had to decrease the dose as she was having more lows.  Lowest was in the 50s, at 59.  She is taking [place] at night and I advised her to move this in the morning, not necessarily to improve the a.m. sugars but to make sure that she is not forgetting it.  She is still forgetting doses and when she does, sugars in the morning are in the 200s mostly.   -She is not checking sugars later in the day, but I suspect that they are very high, since her HbA1c is 8.8% (stable).  I again advised her to start checking later in the day, also.  For now, we will increase the dose of Ozempic.  I advised her to let me know if she is seeing any more low blood sugars, in which case, will need to probably stop glipizide ER and may be even reducing Tresiba dose. - I suggested to:  Patient Instructions  Please continue: - Metformin ER 750 mg at dinnertime - Glipizide ER 2.5 mg before breakfast  Please increase: - Ozempic 1 mg weekly  Please use: - Tresiba 20 units and move this to morning  Let me know if the sugars in am decrease to <70.  Please come back for a follow-up appointment in 3 to 4 months.  - advised to check sugars at different times of the day - 1-2x a day, rotating check times - advised for yearly eye exams >> she is UTD - return to clinic in 3-4 months  2. HL -Reviewed latest lipid panel from 10/2018: LDL higher than goal which is less than 70, triglycerides also high, HDL low Lab Results  Component Value Date   CHOL 158 10/09/2018   HDL 36.50 (L) 10/09/2018   LDLCALC 84 10/09/2018   TRIG 189.0 (H) 10/09/2018   CHOLHDL 4 10/09/2018  -Continues Crestor 10 and Vascepa without side effects  3.  Overweight -Continue Ozempic (actually we will increase the dose) which should also help with weight loss and we were also able to decrease Tresiba dose, which should also help -Weight stable since last visit   Philemon Kingdom, MD PhD [facility]

## 2019-07-03 NOTE — Addendum Note (Signed)
Addended by: Cardell Peach I on: 07/03/2019 11:39 AM   Modules accepted: Orders

## 2019-07-18 ENCOUNTER — Ambulatory Visit: Payer: Medicare Other | Admitting: Orthotics

## 2019-07-18 ENCOUNTER — Other Ambulatory Visit: Payer: Self-pay

## 2019-07-18 DIAGNOSIS — M2042 Other hammer toe(s) (acquired), left foot: Secondary | ICD-10-CM

## 2019-07-18 DIAGNOSIS — E1140 Type 2 diabetes mellitus with diabetic neuropathy, unspecified: Secondary | ICD-10-CM

## 2019-07-18 DIAGNOSIS — G629 Polyneuropathy, unspecified: Secondary | ICD-10-CM

## 2019-07-18 DIAGNOSIS — M2041 Other hammer toe(s) (acquired), right foot: Secondary | ICD-10-CM

## 2019-07-18 NOTE — Progress Notes (Signed)

## 2019-07-24 DIAGNOSIS — H4052X3 Glaucoma secondary to other eye disorders, left eye, severe stage: Secondary | ICD-10-CM

## 2019-07-24 DIAGNOSIS — H547 Unspecified visual loss: Secondary | ICD-10-CM | POA: Diagnosis not present

## 2019-07-24 DIAGNOSIS — H25813 Combined forms of age-related cataract, bilateral: Secondary | ICD-10-CM | POA: Diagnosis not present

## 2019-07-24 DIAGNOSIS — H401133 Primary open-angle glaucoma, bilateral, severe stage: Secondary | ICD-10-CM | POA: Diagnosis not present

## 2019-07-24 DIAGNOSIS — H34812 Central retinal vein occlusion, left eye, with macular edema: Secondary | ICD-10-CM | POA: Diagnosis not present

## 2019-07-24 DIAGNOSIS — E113311 Type 2 diabetes mellitus with moderate nonproliferative diabetic retinopathy with macular edema, right eye: Secondary | ICD-10-CM | POA: Diagnosis not present

## 2019-07-24 HISTORY — DX: Glaucoma secondary to other eye disorders, left eye, severe stage: H40.52X3

## 2019-08-01 ENCOUNTER — Other Ambulatory Visit: Payer: Self-pay | Admitting: Internal Medicine

## 2019-08-13 ENCOUNTER — Telehealth: Payer: Self-pay | Admitting: Internal Medicine

## 2019-08-13 NOTE — Chronic Care Management (AMB) (Signed)
  Chronic Care Management   Note  08/13/2019 Name: TRYSTIN HARGROVE MRN: [id number] DOB: 1940-11-03  TAELAR GRONEWOLD is a 79 y.o. year old female who is a primary care patient of Glendale Chard, MD. I reached out to [name] by phone today in response to a referral sent by Ms. Lanny Hurst Cohenour's health plan.     Ms. Sigler was given information about Chronic Care Management services today including:  1. CCM service includes personalized support from designated clinical staff supervised by her physician, including individualized plan of care and coordination with other care providers 2. 24/7 contact phone numbers for assistance for urgent and routine care needs. 3. Service will only be billed when office clinical staff spend 20 minutes or more in a month to coordinate care. 4. Only one practitioner may furnish and bill the service in a calendar month. 5. The patient may stop CCM services at any time (effective at the end of the month) by phone call to the office staff. 6. The patient will be responsible for cost sharing (co-pay) of up to 20% of the service fee (after annual deductible is met).  Patient agreed to services and verbal consent obtained.   Follow up plan: Face to Face appointment with care management team member scheduled for: 09/04/2019.  [place], [place] [address] Direct Dial: [phone] [email or web address]

## 2019-08-19 ENCOUNTER — Encounter: Payer: Medicare Other | Admitting: Internal Medicine

## 2019-08-19 ENCOUNTER — Ambulatory Visit: Payer: Medicare Other

## 2019-09-04 ENCOUNTER — Ambulatory Visit: Payer: Medicare Other

## 2019-09-04 ENCOUNTER — Other Ambulatory Visit: Payer: Self-pay

## 2019-09-04 ENCOUNTER — Ambulatory Visit (INDEPENDENT_AMBULATORY_CARE_PROVIDER_SITE_OTHER): Payer: Medicare Other | Admitting: Internal Medicine

## 2019-09-04 ENCOUNTER — Ambulatory Visit (INDEPENDENT_AMBULATORY_CARE_PROVIDER_SITE_OTHER): Payer: Medicare Other

## 2019-09-04 ENCOUNTER — Telehealth: Payer: Medicare Other

## 2019-09-04 ENCOUNTER — Encounter: Payer: Self-pay | Admitting: Internal Medicine

## 2019-09-04 VITALS — BP 156/70 | HR 76 | Temp 98.1°F | Ht 60.0 in | Wt 143.4 lb

## 2019-09-04 DIAGNOSIS — Z0000 Encounter for general adult medical examination without abnormal findings: Secondary | ICD-10-CM | POA: Diagnosis not present

## 2019-09-04 DIAGNOSIS — E1165 Type 2 diabetes mellitus with hyperglycemia: Secondary | ICD-10-CM

## 2019-09-04 DIAGNOSIS — I129 Hypertensive chronic kidney disease with stage 1 through stage 4 chronic kidney disease, or unspecified chronic kidney disease: Secondary | ICD-10-CM | POA: Diagnosis not present

## 2019-09-04 DIAGNOSIS — E1159 Type 2 diabetes mellitus with other circulatory complications: Secondary | ICD-10-CM | POA: Diagnosis not present

## 2019-09-04 DIAGNOSIS — Z23 Encounter for immunization: Secondary | ICD-10-CM

## 2019-09-04 DIAGNOSIS — IMO0002 Reserved for concepts with insufficient information to code with codable children: Secondary | ICD-10-CM

## 2019-09-04 DIAGNOSIS — H6123 Impacted cerumen, bilateral: Secondary | ICD-10-CM

## 2019-09-04 DIAGNOSIS — Z794 Long term (current) use of insulin: Secondary | ICD-10-CM

## 2019-09-04 DIAGNOSIS — E785 Hyperlipidemia, unspecified: Secondary | ICD-10-CM

## 2019-09-04 DIAGNOSIS — E1169 Type 2 diabetes mellitus with other specified complication: Secondary | ICD-10-CM

## 2019-09-04 LAB — POCT URINALYSIS DIPSTICK
Bilirubin, UA: NEGATIVE
Blood, UA: NEGATIVE
Glucose, UA: NEGATIVE
Ketones, UA: NEGATIVE
Leukocytes, UA: NEGATIVE
Nitrite, UA: NEGATIVE
Protein, UA: POSITIVE — AB
Spec Grav, UA: >=1.03 — AB (ref 1.010–1.025)
Urobilinogen, UA: 0.2 E.U./dL
pH, UA: 5.5 (ref 5.0–8.0)

## 2019-09-04 LAB — POCT UA - MICROALBUMIN
Albumin/Creatinine Ratio, Urine, POC: >300
Creatinine, POC: 200 mg/dL
Microalbumin Ur, POC: 150 mg/L

## 2019-09-04 MED ORDER — TELMISARTAN-HCTZ 80-25 MG PO TABS: 1.0000 | ORAL_TABLET | Freq: Every day | ORAL | 1 refills | Status: DC

## 2019-09-04 MED ORDER — BOOSTRIX 5-2.5-18.5 LF-MCG/0.5 IM SUSP: 0.5000 mL | Freq: Once | INTRAMUSCULAR | 0 refills | Status: AC

## 2019-09-04 MED ORDER — PREVNAR 13 IM SUSP: 0.5000 mL | INTRAMUSCULAR | 0 refills | Status: AC

## 2019-09-04 NOTE — Addendum Note (Signed)
Addended by: Kellie Simmering on: 09/04/2019 02:21 PM   Modules accepted: Orders

## 2019-09-04 NOTE — Patient Instructions (Signed)
Health Maintenance, Female Adopting a healthy lifestyle and getting preventive care are important in promoting health and wellness. Ask your health care provider about:  The right schedule for you to have regular tests and exams.  Things you can do on your own to prevent diseases and keep yourself healthy. What should I know about diet, weight, and exercise? Eat a healthy diet   Eat a diet that includes plenty of vegetables, fruits, low-fat dairy products, and lean protein.  Do not eat a lot of foods that are high in solid fats, added sugars, or sodium. Maintain a healthy weight Body mass index (BMI) is used to identify weight problems. It estimates body fat based on height and weight. Your health care provider can help determine your BMI and help you achieve or maintain a healthy weight. Get regular exercise Get regular exercise. This is one of the most important things you can do for your health. Most adults should:  Exercise for at least 150 minutes each week. The exercise should increase your heart rate and make you sweat (moderate-intensity exercise).  Do strengthening exercises at least twice a week. This is in addition to the moderate-intensity exercise.  Spend less time sitting. Even light physical activity can be beneficial. Watch cholesterol and blood lipids Have your blood tested for lipids and cholesterol at 81 years of age, then have this test every 5 years. Have your cholesterol levels checked more often if:  Your lipid or cholesterol levels are high.  You are older than 81 years of age.  You are at high risk for heart disease. What should I know about cancer screening? Depending on your health history and family history, you may need to have cancer screening at various ages. This may include screening for:  Breast cancer.  Cervical cancer.  Colorectal cancer.  Skin cancer.  Lung cancer. What should I know about heart disease, diabetes, and high blood  pressure? Blood pressure and heart disease  High blood pressure causes heart disease and increases the risk of stroke. This is more likely to develop in people who have high blood pressure readings, are of African descent, or are overweight.  Have your blood pressure checked: ? Every 3-5 years if you are 18-39 years of age. ? Every year if you are 40 years old or older. Diabetes Have regular diabetes screenings. This checks your fasting blood sugar level. Have the screening done:  Once every three years after age 40 if you are at a normal weight and have a low risk for diabetes.  More often and at a younger age if you are overweight or have a high risk for diabetes. What should I know about preventing infection? Hepatitis B If you have a higher risk for hepatitis B, you should be screened for this virus. Talk with your health care provider to find out if you are at risk for hepatitis B infection. Hepatitis C Testing is recommended for:  Everyone born from 1945 through 1965.  Anyone with known risk factors for hepatitis C. Sexually transmitted infections (STIs)  Get screened for STIs, including gonorrhea and chlamydia, if: ? You are sexually active and are younger than 81 years of age. ? You are older than 81 years of age and your health care provider tells you that you are at risk for this type of infection. ? Your sexual activity has changed since you were last screened, and you are at increased risk for chlamydia or gonorrhea. Ask your health care provider if   you are at risk.  Ask your health care provider about whether you are at high risk for HIV. Your health care provider may recommend a prescription medicine to help prevent HIV infection. If you choose to take medicine to prevent HIV, you should first get tested for HIV. You should then be tested every 3 months for as long as you are taking the medicine. Pregnancy  If you are about to stop having your period (premenopausal) and  you may become pregnant, seek counseling before you get pregnant.  Take 400 to 800 micrograms (mcg) of folic acid every day if you become pregnant.  Ask for birth control (contraception) if you want to prevent pregnancy. Osteoporosis and menopause Osteoporosis is a disease in which the bones lose minerals and strength with aging. This can result in bone fractures. If you are 65 years old or older, or if you are at risk for osteoporosis and fractures, ask your health care provider if you should:  Be screened for bone loss.  Take a calcium or vitamin D supplement to lower your risk of fractures.  Be given hormone replacement therapy (HRT) to treat symptoms of menopause. Follow these instructions at home: Lifestyle  Do not use any products that contain nicotine or tobacco, such as cigarettes, e-cigarettes, and chewing tobacco. If you need help quitting, ask your health care provider.  Do not use street drugs.  Do not share needles.  Ask your health care provider for help if you need support or information about quitting drugs. Alcohol use  Do not drink alcohol if: ? Your health care provider tells you not to drink. ? You are pregnant, may be pregnant, or are planning to become pregnant.  If you drink alcohol: ? Limit how much you use to 0-1 drink a day. ? Limit intake if you are breastfeeding.  Be aware of how much alcohol is in your drink. In the [place] one drink equals one 12 oz bottle of beer (355 mL), one 5 oz glass of wine (148 mL), or one 1 oz glass of hard liquor (44 mL). General instructions  Schedule regular health, dental, and eye exams.  Stay current with your vaccines.  Tell your health care provider if: ? You often feel depressed. ? You have ever been abused or do not feel safe at home. Summary  Adopting a healthy lifestyle and getting preventive care are important in promoting health and wellness.  Follow your health care provider's instructions about healthy  diet, exercising, and getting tested or screened for diseases.  Follow your health care provider's instructions on monitoring your cholesterol and blood pressure. This information is not intended to replace advice given to you by your health care provider. Make sure you discuss any questions you have with your health care provider. Document Revised: 03/20/2018 Document Reviewed: 03/20/2018 Elsevier Patient Education  2020 [employer] Inc[employer]  

## 2019-09-04 NOTE — Progress Notes (Signed)
This visit occurred during the SARS-CoV-2 public health emergency.  Safety protocols were in place, including screening questions prior to the visit, additional usage of staff PPE, and extensive cleaning of exam room while observing appropriate contact time as indicated for disinfecting solutions.  Subjective:   [name] [name] is a 79 y.o. female who presents for Medicare Annual (Subsequent) preventive examination.  Review of Systems:  n/a Cardiac Risk Factors include: advanced age (>85men, >76 women);diabetes mellitus;dyslipidemia;sedentary lifestyle;hypertension     Objective:     Vitals: BP (!) 156/70 (BP Location: Left Arm, Patient Position: Sitting, Cuff Size: Normal)   Pulse 76   Temp 98.1 F (36.7 C) (Oral)   Ht 5' (1.524 m)   Wt 143 lb 6.4 oz (65 kg)   SpO2 97%   BMI 28.01 kg/m   Body mass index is 28.01 kg/m.  Advanced Directives 09/04/2019 02/12/2019 08/13/2018 07/02/2015 06/29/2015 05/12/2015  Does Patient Have a Medical Advance Directive? No No Yes No No No  Type of Advance Directive - Public librarian;Living will - - -  Copy of [place] in Chart? - - No - copy requested - - -  Would patient like information on creating a medical advance directive? No - Patient declined - - No - patient declined information No - patient declined information No - patient declined information    Tobacco Social History   Tobacco Use  Smoking Status Never Smoker  Smokeless Tobacco Never Used     Counseling given: Not Answered   Clinical Intake:  Pre-visit preparation completed: Yes  Pain : No/denies pain     Nutritional Status: BMI 25 -29 Overweight Nutritional Risks: None Diabetes: Yes  How often do you need to have someone help you when you read instructions, pamphlets, or other written materials from your doctor or pharmacy?: 1 - Never What is the last grade level you completed in school?: 10th grade  Interpreter Needed?:  No  Information entered by :: NAllen LPN  Past Medical History:  Diagnosis Date  . Diabetes mellitus ([place])   . H/O: CVA (cerebrovascular accident) 2002  . HTN (hypertension)   . Hyperlipidemia   . Pulmonary HTN ([place])    mild by echo   Past Surgical History:  Procedure Laterality Date  . ABDOMINAL HYSTERECTOMY    . CARDIAC CATHETERIZATION  03/17/2003   Normal, patent coronary arteries. Normal LV systolic function  . CARDIOVASCULAR STRESS TEST  08/28/2006   Small sized, moderate ischemiain the Basal Inferoseptal, Basal Inferior, Mid Inferior and Apical Inferior regions  . TRANSTHORACIC ECHOCARDIOGRAM  02/17/2010   EF >55%, moderate-severely thickened septum, mild-moderate mitral annular calcification   Family History  Problem Relation Age of Onset  . Hypertension Mother   . Hypertension Father   . Heart Problems Father   . Hypertension Sister   . Diabetes Sister   . Hypertension Brother   . Diabetes Brother    Social History   Socioeconomic History  . Marital status: Married    Spouse name: Not on file  . Number of children: Not on file  . Years of education: Not on file  . Highest education level: Not on file  Occupational History  . Occupation: retired  Tobacco Use  . Smoking status: Never Smoker  . Smokeless tobacco: Never Used  Substance and Sexual Activity  . Alcohol use: No  . Drug use: Never  . Sexual activity: Not Currently  Other Topics Concern  . Not on file  Social History Narrative  . Not on file   Social Determinants of Health   Financial Resource Strain: Low Risk   . Difficulty of Paying Living Expenses: Not hard at all  Food Insecurity: No Food Insecurity  . Worried About Charity fundraiser in the Last Year: Never true  . Ran Out of Food in the Last Year: Never true  Transportation Needs: No Transportation Needs  . Lack of Transportation (Medical): No  . Lack of Transportation (Non-Medical): No  Physical Activity: Inactive  . Days of  Exercise per Week: 0 days  . Minutes of Exercise per Session: 0 min  Stress: No Stress Concern Present  . Feeling of Stress : Not at all  Social Connections:   . Frequency of Communication with Friends and Family:   . Frequency of Social Gatherings with Friends and Family:   . Attends Religious Services:   . Active Member of Clubs or Organizations:   . Attends Archivist Meetings:   Marland Kitchen Marital Status:     Outpatient Encounter Medications as of 09/04/2019  Medication Sig  . ACCU-CHEK AVIVA PLUS test strip TEST 3 TIMES DAILY  . amLODipine (NORVASC) 10 MG tablet TAKE 1 TABLET BY MOUTH EVERY DAY  . aspirin 325 MG EC tablet Take 325 mg by mouth daily.  . B-D UF III MINI PEN NEEDLES 31G X 5 MM MISC Use as directed once per day with Victoza and 2 times per day with Levemir  . glipiZIDE (GLUCOTROL XL) 2.5 MG 24 hr tablet TAKE 1 TABLET(2.5 MG) BY MOUTH DAILY WITH BREAKFAST  . hydrALAZINE (APRESOLINE) 25 MG tablet TAKE 1 TABLET(25 MG) BY MOUTH TWICE DAILY  . hydrOXYzine (ATARAX/VISTARIL) 25 MG tablet Take 1 tablet by mouth at bedtime as needed. 1-2 tablets at bedtime prn  . Lancets (ACCU-CHEK SOFT TOUCH) lancets Check blood sugars 3 times daily E11.65  . latanoprost (XALATAN) 0.005 % ophthalmic solution 1 drop at bedtime.  . metFORMIN (GLUCOPHAGE-XR) 750 MG 24 hr tablet Take 1 tablet (750 mg total) by mouth daily with breakfast.  . Multiple Vitamin (MULTIVITAMIN) tablet Take 1 tablet by mouth daily.  . potassium chloride SA (KLOR-CON) 20 MEQ tablet Take 1 tablet (20 mEq total) by mouth daily.  . rosuvastatin (CRESTOR) 10 MG tablet TAKE 1 TABLET BY MOUTH EVERY DAY  . Semaglutide, 1 MG/DOSE, (OZEMPIC, 1 MG/DOSE,) 2 MG/1.5ML SOPN Inject 1 mg into the skin once a week.  . TRESIBA FLEXTOUCH 200 UNIT/ML SOPN Inject 28 Units into the skin at bedtime.  . pneumococcal 13-valent conjugate vaccine (PREVNAR 13) SUSP injection Inject 0.5 mLs into the muscle tomorrow at 10 am for 1 dose.  . Tdap  (BOOSTRIX) 5-2.5-18.5 LF-MCG/0.5 injection Inject 0.5 mLs into the muscle once for 1 dose.  . timolol (BETIMOL) 0.5 % ophthalmic solution Place 1 drop into both eyes at bedtime.   No facility-administered encounter medications on file as of 09/04/2019.    Activities of Daily Living In your present state of health, do you have any difficulty performing the following activities: 09/04/2019  Hearing? N  Vision? Y  Comment can't see out of left eye  Difficulty concentrating or making decisions? N  Walking or climbing stairs? N  Dressing or bathing? N  Doing errands, shopping? N  Preparing Food and eating ? N  Using the Toilet? N  In the past six months, have you accidently leaked urine? N  Do you have problems with loss of bowel control? N  Managing your  Medications? N  Managing your Finances? N  Housekeeping or managing your Housekeeping? N  Some recent data might be hidden    Patient Care Team: Glendale Chard, MD as PCP - General (Internal Medicine) Cyril Mourning, [facility] (Pharmacist)    Assessment:   This is a routine wellness examination for [name] [name].  Exercise Activities and Dietary recommendations    Goals    . Patient Stated     No goals    . Patient Stated     09/04/2019, no goals       Fall Risk Fall Risk  09/04/2019 08/13/2018 08/13/2018 05/21/2018 04/16/2018  Falls in the past year? 0 0 0 0 0  Risk for fall due to : Medication side effect Medication side effect - - -  Follow up Falls evaluation completed;Education provided;Falls prevention discussed Falls prevention discussed - - -   Is the patient's home free of loose throw rugs in walkways, pet beds, electrical cords, etc?   yes      Grab bars in the bathroom? yes      Handrails on the stairs?   yes      Adequate lighting?   yes  Timed Get Up and Go performed: n/a  Depression Screen PHQ 2/9 Scores 09/04/2019 08/13/2018 08/13/2018 05/21/2018  PHQ - 2 Score 0 0 0 0  PHQ- 9 Score 0 0 - -     Cognitive Function      6CIT Screen 09/04/2019 08/13/2018  What Year? 0 points 0 points  What month? 0 points 0 points  What time? 0 points 0 points  Count back from 20 0 points 0 points  Months in reverse 0 points 0 points  Repeat phrase 6 points 0 points  Total Score 6 0    Immunization History  Administered Date(s) Administered  . Fluad Quad(high Dose 65+) 12/12/2018  . Influenza, High Dose Seasonal PF 01/08/2018, 01/02/2019  . Influenza-Unspecified 04/10/2017  . Moderna SARS-COVID-2 Vaccination 05/22/2019, 06/20/2019  . Pneumococcal Polysaccharide-23 03/31/2014  . Pneumococcal-Unspecified 03/31/2014  . Zoster 12/31/2007    Qualifies for Shingles Vaccine? yes  Screening Tests Health Maintenance  Topic Date Due  . TETANUS/TDAP  Never done  . PNA vac Low Risk Adult (2 of 2 - PCV13) 04/01/2015  . OPHTHALMOLOGY EXAM  05/24/2019  . INFLUENZA VACCINE  11/09/2019  . HEMOGLOBIN A1C  01/03/2020  . FOOT EXAM  02/13/2020  . DEXA SCAN  Completed  . COVID-19 Vaccine  Completed    Cancer Screenings: Lung: Low Dose CT Chest recommended if Age 66-80 years, 30 pack-year currently smoking OR have quit w/in 15years. Patient does not qualify. Breast:  Up to date on Mammogram? Yes   Up to date of Bone Density/Dexa? Yes Colorectal: not required  Additional Screenings: : Hepatitis C Screening: n/a     Plan:    Patient has no goals set at this time.   I have personally reviewed and noted the following in the patient's chart:   . Medical and social history . Use of alcohol, tobacco or illicit drugs  . Current medications and supplements . Functional ability and status . Nutritional status . Physical activity . Advanced directives . List of other physicians . Hospitalizations, surgeries, and ER visits in previous 12 months . Vitals . Screenings to include cognitive, depression, and falls . Referrals and appointments  In addition, I have reviewed and discussed with patient certain preventive  protocols, quality metrics, and best practice recommendations. A written personalized care plan for preventive  services as well as general preventive health recommendations were provided to patient.     Kellie Simmering, LPN  0/12/2328

## 2019-09-04 NOTE — Progress Notes (Signed)
This visit occurred during the SARS-CoV-2 public health emergency.  Safety protocols were in place, including screening questions prior to the visit, additional usage of staff PPE, and extensive cleaning of exam room while observing appropriate contact time as indicated for disinfecting solutions.  Subjective:     Patient ID: [name] [name] , female    DOB: 1940/10/04 , 79 y.o.   MRN: [id number]   Chief Complaint  Patient presents with  . Annual Exam  . Diabetes  . Hypertension    HPI  She is here today for a full physical exam. She is no longer followed by GYN. She feels well. She denies cp, shortness of breath and palpitations.   Diabetes She presents for her follow-up diabetic visit. She has type 2 diabetes mellitus. There are no hypoglycemic associated symptoms. Pertinent negatives for diabetes include no blurred vision and no chest pain. There are no hypoglycemic complications. Risk factors for coronary artery disease include diabetes mellitus, dyslipidemia, sedentary lifestyle and post-menopausal. She is following a generally healthy diet. She participates in exercise intermittently. An ACE inhibitor/angiotensin II receptor blocker is not being taken. She sees a podiatrist.Eye exam is current.  Hypertension This is a chronic problem. The current episode started more than 1 year ago. The problem has been gradually improving since onset. The problem is controlled. Pertinent negatives include no blurred vision, chest pain, palpitations or shortness of breath. Risk factors for coronary artery disease include diabetes mellitus, dyslipidemia, post-menopausal state and sedentary lifestyle.     Past Medical History:  Diagnosis Date  . Diabetes mellitus ([place])   . H/O: CVA (cerebrovascular accident) 2002  . HTN (hypertension)   . Hyperlipidemia   . Pulmonary HTN ([facility])    mild by echo     Family History  Problem Relation Age of Onset  . Hypertension Mother   . Hypertension Father    . Heart Problems Father   . Hypertension Sister   . Diabetes Sister   . Hypertension Brother   . Diabetes Brother      Current Outpatient Medications:  .  ACCU-CHEK AVIVA PLUS test strip, TEST 3 TIMES DAILY, Disp: 100 each, Rfl: 11 .  amLODipine (NORVASC) 10 MG tablet, TAKE 1 TABLET BY MOUTH EVERY DAY, Disp: 90 tablet, Rfl: 1 .  aspirin 325 MG EC tablet, Take 325 mg by mouth daily., Disp: , Rfl:  .  B-D UF III MINI PEN NEEDLES 31G X 5 MM MISC, Use as directed once per day with Victoza and 2 times per day with Levemir, Disp: 200 each, Rfl: 1 .  glipiZIDE (GLUCOTROL XL) 2.5 MG 24 hr tablet, TAKE 1 TABLET(2.5 MG) BY MOUTH DAILY WITH BREAKFAST, Disp: 90 tablet, Rfl: 3 .  hydrALAZINE (APRESOLINE) 25 MG tablet, TAKE 1 TABLET(25 MG) BY MOUTH TWICE DAILY, Disp: 180 tablet, Rfl: 1 .  hydrOXYzine (ATARAX/VISTARIL) 25 MG tablet, Take 1 tablet by mouth at bedtime as needed. 1-2 tablets at bedtime prn, Disp: , Rfl:  .  Lancets (ACCU-CHEK SOFT TOUCH) lancets, Check blood sugars 3 times daily E11.65, Disp: 100 each, Rfl: 12 .  latanoprost (XALATAN) 0.005 % ophthalmic solution, 1 drop at bedtime., Disp: , Rfl:  .  metFORMIN (GLUCOPHAGE-XR) 750 MG 24 hr tablet, Take 1 tablet (750 mg total) by mouth daily with breakfast., Disp: 90 tablet, Rfl: 1 .  Multiple Vitamin (MULTIVITAMIN) tablet, Take 1 tablet by mouth daily., Disp: , Rfl:  .  pneumococcal 13-valent conjugate vaccine (PREVNAR 13) SUSP injection, Inject 0.5 mLs into  the muscle tomorrow at 10 am for 1 dose., Disp: 0.5 mL, Rfl: 0 .  potassium chloride SA (KLOR-CON) 20 MEQ tablet, Take 1 tablet (20 mEq total) by mouth daily., Disp: 7 tablet, Rfl: 0 .  rosuvastatin (CRESTOR) 10 MG tablet, TAKE 1 TABLET BY MOUTH EVERY DAY, Disp: 90 tablet, Rfl: 1 .  Semaglutide, 1 MG/DOSE, (OZEMPIC, 1 MG/DOSE,) 2 MG/1.5ML SOPN, Inject 1 mg into the skin once a week., Disp: 6 pen, Rfl: 3 .  Tdap (BOOSTRIX) 5-2.5-18.5 LF-MCG/0.5 injection, Inject 0.5 mLs into the muscle once  for 1 dose., Disp: 0.5 mL, Rfl: 0 .  timolol (BETIMOL) 0.5 % ophthalmic solution, Place 1 drop into both eyes at bedtime., Disp: , Rfl:  .  TRESIBA FLEXTOUCH 200 UNIT/ML SOPN, Inject 28 Units into the skin at bedtime., Disp: 15 pen, Rfl: 5   Allergies  Allergen Reactions  . Brimonidine Other (See Comments)    Eyes swell and itch  . Dorzolamide     Other reaction(s): Other (See Comments) Red, inflamed eyelid, dermatitis   . Timolol Other (See Comments)    Caused eyes to swell and itch     The patient states she uses post menopausal status for birth control. Last LMP was No LMP recorded. Patient has had a hysterectomy.. Negative for Dysmenorrhea  Negative for: breast discharge, breast lump(s), breast pain and breast self exam. Associated symptoms include abnormal vaginal bleeding. Pertinent negatives include abnormal bleeding (hematology), anxiety, decreased libido, depression, difficulty falling sleep, dyspareunia, history of infertility, nocturia, sexual dysfunction, sleep disturbances, urinary incontinence, urinary urgency, vaginal discharge and vaginal itching. Diet regular.The patient states her exercise level is  intermittent.  . The patient's tobacco use is:  Social History   Tobacco Use  Smoking Status Never Smoker  Smokeless Tobacco Never Used  . She has been exposed to passive smoke. The patient's alcohol use is:  Social History   Substance and Sexual Activity  Alcohol Use No    Review of Systems  Constitutional: Negative.   HENT: Negative.   Eyes: Negative.  Negative for blurred vision.  Respiratory: Negative.  Negative for shortness of breath.   Cardiovascular: Negative.  Negative for chest pain and palpitations.  Endocrine: Negative.   Genitourinary: Negative.   Musculoskeletal: Negative.   Skin: Negative.   Allergic/Immunologic: Negative.   Neurological: Negative.   Hematological: Negative.   Psychiatric/Behavioral: Negative.      Today's Vitals   09/04/19  1050  BP: (!) 156/70  Pulse: 76  Temp: 98.1 F (36.7 C)  TempSrc: Oral  Weight: 143 lb 6.4 oz (65 kg)  Height: 5' (1.524 m)  PainSc: 0-No pain   Body mass index is 28.01 kg/m.   Objective:  Physical Exam Vitals and nursing note reviewed.  Constitutional:      Appearance: Normal appearance.  HENT:     Head: Normocephalic and atraumatic.     Right Ear: Ear canal and external ear normal. There is impacted cerumen.     Left Ear: Ear canal and external ear normal. There is impacted cerumen.     Nose:     Comments: Deferred, masked    Mouth/Throat:     Comments: Deferred, masked Eyes:     Extraocular Movements: Extraocular movements intact.     Conjunctiva/sclera: Conjunctivae normal.     Pupils: Pupils are equal, round, and reactive to light.  Cardiovascular:     Rate and Rhythm: Normal rate and regular rhythm.     Pulses: Normal pulses.  Dorsalis pedis pulses are 2+ on the right side and 2+ on the left side.     Heart sounds: Normal heart sounds.  Pulmonary:     Effort: Pulmonary effort is normal.     Breath sounds: Normal breath sounds.  Chest:     Breasts: Tanner Score is 5.        Right: Normal.        Left: Normal.  Abdominal:     General: Abdomen is flat. Bowel sounds are normal.     Palpations: Abdomen is soft.  Genitourinary:    Comments: deferred Musculoskeletal:        General: Normal range of motion.     Cervical back: Normal range of motion and neck supple.     Right foot: Bunion present.     Left foot: Bunion present.  Feet:     Right foot:     Protective Sensation: 5 sites tested. 5 sites sensed.     Skin integrity: Dry skin present.     Toenail Condition: Right toenails are normal.     Left foot:     Protective Sensation: 5 sites tested. 5 sites sensed.     Skin integrity: Dry skin present.     Toenail Condition: Left toenails are normal.  Skin:    General: Skin is warm and dry.  Neurological:     General: No focal deficit present.      Mental Status: She is alert and oriented to person, place, and time.  Psychiatric:        Mood and Affect: Mood normal.        Behavior: Behavior normal.         Assessment And Plan:     1. Routine general medical examination at health care facility  A full exam was performed.  Importance of monthly self breast exams was discussed with the patient. PATIENT IS ADVISED TO GET 30-45 MINUTES REGULAR EXERCISE NO LESS THAN FOUR TO FIVE DAYS PER WEEK - BOTH WEIGHTBEARING EXERCISES AND AEROBIC ARE RECOMMENDED.  SHE IS ADVISED TO FOLLOW A HEALTHY DIET WITH AT LEAST SIX FRUITS/VEGGIES PER DAY, DECREASE INTAKE OF RED MEAT, AND TO INCREASE FISH INTAKE TO TWO DAYS PER WEEK.  MEATS/FISH SHOULD NOT BE FRIED, BAKED OR BROILED IS PREFERABLE.  I SUGGEST WEARING SPF 50 SUNSCREEN ON EXPOSED PARTS AND ESPECIALLY WHEN IN THE DIRECT SUNLIGHT FOR AN EXTENDED PERIOD OF TIME.  PLEASE AVOID FAST FOOD RESTAURANTS AND INCREASE YOUR WATER INTAKE.   2. Uncontrolled type 2 diabetes mellitus with circulatory disorder, with long-term current use of insulin ([place])  Diabetic foot exam was performed. I DISCUSSED WITH THE PATIENT AT LENGTH REGARDING THE GOALS OF GLYCEMIC CONTROL AND POSSIBLE LONG-TERM COMPLICATIONS.  I  ALSO STRESSED THE IMPORTANCE OF COMPLIANCE WITH HOME GLUCOSE MONITORING, DIETARY RESTRICTIONS INCLUDING AVOIDANCE OF SUGARY DRINKS/PROCESSED FOODS,  ALONG WITH REGULAR EXERCISE.  I  ALSO STRESSED THE IMPORTANCE OF ANNUAL EYE EXAMS, SELF FOOT CARE AND COMPLIANCE WITH OFFICE VISITS.  - CMP14+EGFR - CBC - Lipid panel - Hemoglobin A1c  3. Hypertensive nephropathy  Chronic, uncontrolled. She is missing two medications out of her bag today - both telmisartan and metoprolol. She is not sure why she is not taking these meds. Rx for telmisartan/hctz was called into the pharmacy. She will rto in four weeks for re-evaluation. Will consider CCM nurse eval for medication adherence. Importance of both dietary and medication  compliance was discussed with the patient.   4. Bilateral impacted cerumen  AFTER OBTAINING VERBAL CONSENT, BOTH EARS WERE FLUSHED BY IRRIGATION. SHE TOLERATED PROCEDURE WELL WITHOUT ANY COMPLICATIONS. NO TM ABNORMALITIES WERE NOTED.  - Ear Lavage    Maximino Greenland, MD    THE PATIENT IS ENCOURAGED TO PRACTICE SOCIAL DISTANCING DUE TO THE COVID-19 PANDEMIC.

## 2019-09-04 NOTE — Patient Instructions (Signed)
Ms. [name] [name] , Thank you for taking time to come for your Medicare Wellness Visit. I appreciate your ongoing commitment to your health goals. Please review the following plan we discussed and let me know if I can assist you in the future.   Screening recommendations/referrals: Colonoscopy: not required Mammogram: 10/2018 Bone Density: 09/2016 Recommended yearly ophthalmology/optometry visit for glaucoma screening and checkup Recommended yearly dental visit for hygiene and checkup  Vaccinations: Influenza vaccine: 12/2018 Pneumococcal vaccine: sent to pharmacy Tdap vaccine: sent to pharmacy Shingles vaccine: discussed    Advanced directives: Advance directive discussed with you today. Even though you declined this today please call our office should you change your mind and we can give you the proper paperwork for you to fill out.   Conditions/risks identified: overweight  Next appointment: 09/09/2020 at 10:45   Preventive Care 65 Years and Older, Female Preventive care refers to lifestyle choices and visits with your health care provider that can promote health and wellness. What does preventive care include?  A yearly physical exam. This is also called an annual well check.  Dental exams once or twice a year.  Routine eye exams. Ask your health care provider how often you should have your eyes checked.  Personal lifestyle choices, including:  Daily care of your teeth and gums.  Regular physical activity.  Eating a healthy diet.  Avoiding tobacco and drug use.  Limiting alcohol use.  Practicing safe sex.  Taking low-dose aspirin every day.  Taking vitamin and mineral supplements as recommended by your health care provider. What happens during an annual well check? The services and screenings done by your health care provider during your annual well check will depend on your age, overall health, lifestyle risk factors, and family history of disease. Counseling  Your  health care provider may ask you questions about your:  Alcohol use.  Tobacco use.  Drug use.  Emotional well-being.  Home and relationship well-being.  Sexual activity.  Eating habits.  History of falls.  Memory and ability to understand (cognition).  Work and work Statistician.  Reproductive health. Screening  You may have the following tests or measurements:  Height, weight, and BMI.  Blood pressure.  Lipid and cholesterol levels. These may be checked every 5 years, or more frequently if you are over 48 years old.  Skin check.  Lung cancer screening. You may have this screening every year starting at age 71 if you have a 30-pack-year history of smoking and currently smoke or have quit within the past 15 years.  Fecal occult blood test (FOBT) of the stool. You may have this test every year starting at age 59.  Flexible sigmoidoscopy or colonoscopy. You may have a sigmoidoscopy every 5 years or a colonoscopy every 10 years starting at age 80.  Hepatitis C blood test.  Hepatitis B blood test.  Sexually transmitted disease (STD) testing.  Diabetes screening. This is done by checking your blood sugar (glucose) after you have not eaten for a while (fasting). You may have this done every 1-3 years.  Bone density scan. This is done to screen for osteoporosis. You may have this done starting at age 31.  Mammogram. This may be done every 1-2 years. Talk to your health care provider about how often you should have regular mammograms. Talk with your health care provider about your test results, treatment options, and if necessary, the need for more tests. Vaccines  Your health care provider may recommend certain vaccines, such as:  Influenza  vaccine. This is recommended every year.  Tetanus, diphtheria, and acellular pertussis (Tdap, Td) vaccine. You may need a Td booster every 10 years.  Zoster vaccine. You may need this after age 53.  Pneumococcal 13-valent  conjugate (PCV13) vaccine. One dose is recommended after age 8.  Pneumococcal polysaccharide (PPSV23) vaccine. One dose is recommended after age 48. Talk to your health care provider about which screenings and vaccines you need and how often you need them. This information is not intended to replace advice given to you by your health care provider. Make sure you discuss any questions you have with your health care provider. Document Released: 04/23/2015 Document Revised: 12/15/2015 Document Reviewed: 01/26/2015 Elsevier Interactive Patient Education  2017 [place] Prevention in the Home Falls can cause injuries. They can happen to people of all ages. There are many things you can do to make your home safe and to help prevent falls. What can I do on the outside of my home?  Regularly fix the edges of walkways and driveways and fix any cracks.  Remove anything that might make you trip as you walk through a door, such as a raised step or threshold.  Trim any bushes or trees on the path to your home.  Use bright outdoor lighting.  Clear any walking paths of anything that might make someone trip, such as rocks or tools.  Regularly check to see if handrails are loose or broken. Make sure that both sides of any steps have handrails.  Any raised decks and porches should have guardrails on the edges.  Have any leaves, snow, or ice cleared regularly.  Use sand or salt on walking paths during winter.  Clean up any spills in your garage right away. This includes oil or grease spills. What can I do in the bathroom?  Use night lights.  Install grab bars by the toilet and in the tub and shower. Do not use towel bars as grab bars.  Use non-skid mats or decals in the tub or shower.  If you need to sit down in the shower, use a plastic, non-slip stool.  Keep the floor dry. Clean up any water that spills on the floor as soon as it happens.  Remove soap buildup in the tub or  shower regularly.  Attach bath mats securely with double-sided non-slip rug tape.  Do not have throw rugs and other things on the floor that can make you trip. What can I do in the bedroom?  Use night lights.  Make sure that you have a light by your bed that is easy to reach.  Do not use any sheets or blankets that are too big for your bed. They should not hang down onto the floor.  Have a firm chair that has side arms. You can use this for support while you get dressed.  Do not have throw rugs and other things on the floor that can make you trip. What can I do in the kitchen?  Clean up any spills right away.  Avoid walking on wet floors.  Keep items that you use a lot in easy-to-reach places.  If you need to reach something above you, use a strong step stool that has a grab bar.  Keep electrical cords out of the way.  Do not use floor polish or wax that makes floors slippery. If you must use wax, use non-skid floor wax.  Do not have throw rugs and other things on the floor that can  make you trip. What can I do with my stairs?  Do not leave any items on the stairs.  Make sure that there are handrails on both sides of the stairs and use them. Fix handrails that are broken or loose. Make sure that handrails are as long as the stairways.  Check any carpeting to make sure that it is firmly attached to the stairs. Fix any carpet that is loose or worn.  Avoid having throw rugs at the top or bottom of the stairs. If you do have throw rugs, attach them to the floor with carpet tape.  Make sure that you have a light switch at the top of the stairs and the bottom of the stairs. If you do not have them, ask someone to add them for you. What else can I do to help prevent falls?  Wear shoes that:  Do not have high heels.  Have rubber bottoms.  Are comfortable and fit you well.  Are closed at the toe. Do not wear sandals.  If you use a stepladder:  Make sure that it is fully  opened. Do not climb a closed stepladder.  Make sure that both sides of the stepladder are locked into place.  Ask someone to hold it for you, if possible.  Clearly mark and make sure that you can see:  Any grab bars or handrails.  First and last steps.  Where the edge of each step is.  Use tools that help you move around (mobility aids) if they are needed. These include:  Canes.  Walkers.  Scooters.  Crutches.  Turn on the lights when you go into a dark area. Replace any light bulbs as soon as they burn out.  Set up your furniture so you have a clear path. Avoid moving your furniture around.  If any of your floors are uneven, fix them.  If there are any pets around you, be aware of where they are.  Review your medicines with your doctor. Some medicines can make you feel dizzy. This can increase your chance of falling. Ask your doctor what other things that you can do to help prevent falls. This information is not intended to replace advice given to you by your health care provider. Make sure you discuss any questions you have with your health care provider. Document Released: 01/21/2009 Document Revised: 09/02/2015 Document Reviewed: 05/01/2014 Elsevier Interactive Patient Education  2017 [employer].

## 2019-09-04 NOTE — Chronic Care Management (AMB) (Signed)
Chronic Care Management Pharmacy  Name: [name] [name]  MRN: [id number] DOB: Aug 23, 1940  Chief Complaint/ HPI  [name] [name],  79 y.o. , female presents for their Initial CCM visit with the clinical pharmacist In office.  PCP : [name] Chard, MD  Their chronic conditions include: Hypertension, Hyperlipidemia, Diabetes  Office Visits: 02/13/19 OV: Presented for HTN check and ER follow up. Labs ordered (HgbA1c). Pt encouraged to return in 2 weeks for repeat BMP8+EGFR testing.   Consult Visit: 07/24/19 Ophthalmology OV w. Dr. Cordelia [name]: Presented for follow up for diabetic macular edema of both eyes. Pt has undergone multiple procedures. Primary open angle glaucoma and nuclear cataracts present in both eyes. Placed on timolol drops BID OU in 2015 for elevated IOP, still using today. Pt reports vision is stable. Exam revealed edema and worsening of vision in right eye. Avastin injection administered in right eye. Follow up in 3 months.  07/18/19 Podiatry OV: Pt was measured for medical necessity extra depth shoes and custom foam orthotics. Forms sent to PCP to verify and sign off on medical necessity.   07/03/19 Endocrinology OV w/ Dr. Cruzita [name]: Presented for follow up for DM. Pt reported several low BG readings in the AM. [name] [name] was decreased at previous appt to 20 units nightly due to BG improving and low BG. Instructed pt to move [name] [name] dose to AM so that she does not forget it. Increased Ozempic to 46m weekly. Follow up in 3-4 months.   05/14/19 Podiatry OV w/ Dr. RPaulla [name] Pt reported more tingling and burning in right foot. Recommended diabetic shoes.   03/20/19 Ophthalmology OV w. Dr. GCordelia [name] Diabetic macular edema, OD stable. Hold eye injection today due to stability. Follow up in 3 months.   02/12/19 ED visit: Presented for near syncopal episode. Pt reported waking up this morning and became lightheaded after walking in to the kitchen to make coffee. Her husband helped her to the ground  and she reported generalized weakness. She denied chest pain, SOB, headache, vision changes, or nausea/vomiting. No acute findings. Pt experienced no symptoms while in ED. Mild hypokalemia present and supplemented. Recheck with PCP in 1 week.   Medications: Outpatient Encounter Medications as of 09/04/2019  Medication Sig  . ACCU-CHEK AVIVA PLUS test strip TEST 3 TIMES DAILY  . amLODipine (NORVASC) 10 MG tablet TAKE 1 TABLET BY MOUTH EVERY DAY  . aspirin 325 MG EC tablet Take 325 mg by mouth daily.  . B-D UF III MINI [name] NEEDLES 31G X 5 MM MISC Use as directed once per day with Victoza and 2 times per day with Levemir  . glipiZIDE (GLUCOTROL XL) 2.5 MG 24 hr tablet TAKE 1 TABLET(2.5 MG) BY MOUTH DAILY WITH BREAKFAST  . hydrALAZINE (APRESOLINE) 25 MG tablet TAKE 1 TABLET(25 MG) BY MOUTH TWICE DAILY  . hydrOXYzine (ATARAX/VISTARIL) 25 MG tablet Take 1 tablet by mouth at bedtime as needed. 1-2 tablets at bedtime prn  . Lancets (ACCU-CHEK SOFT TOUCH) lancets Check blood sugars 3 times daily E11.65  . latanoprost (XALATAN) 0.005 % ophthalmic solution Place 1 drop into both eyes at bedtime.   . metFORMIN (GLUCOPHAGE-XR) 750 MG 24 hr tablet Take 1 tablet (750 mg total) by mouth daily with breakfast. (Patient taking differently: Take 750 mg by mouth daily with breakfast. Takes with dinner)  . metoprolol succinate (TOPROL-XL) 25 MG 24 hr tablet Take 25 mg by mouth daily.  . Multiple Vitamin (MULTIVITAMIN) tablet Take 1 tablet by mouth daily.  . rosuvastatin (CRESTOR) 10  MG tablet TAKE 1 TABLET BY MOUTH EVERY DAY  . Semaglutide, 1 MG/DOSE, (OZEMPIC, 1 MG/DOSE,) 2 MG/1.5ML SOPN Inject 1 mg into the skin once a week.  . TRESIBA FLEXTOUCH 200 UNIT/ML SOPN Inject 28 Units into the skin at bedtime.  . potassium chloride SA (KLOR-CON) 20 MEQ tablet Take 1 tablet (20 mEq total) by mouth daily. (Patient not taking: Reported on 10/08/2019)  . timolol (BETIMOL) 0.5 % ophthalmic solution Place 1 drop into both eyes at  bedtime. (Patient not taking: Reported on 10/08/2019)   No facility-administered encounter medications on file as of 09/04/2019.    Current Diagnosis/Assessment:  SDOH Interventions     Most Recent Value  SDOH Interventions  Financial Strain Interventions --  [Offered patient assistance as an option when patient enters coverage gap]      Goals Addressed            This Visit's Progress   . Pharmacy Care Plan       CARE PLAN ENTRY (see longitudinal plan of care for additional care plan information)  Current Barriers:  . Chronic Disease Management support, education, and care coordination needs related to Hypertension, Hyperlipidemia, and Diabetes   Hypertension BP Readings from Last 3 Encounters:  09/04/19 (!) 156/70  09/04/19 (!) 156/70  07/03/19 (!) 150/58   . Pharmacist Clinical Goal(s): o Over the next 90 days, patient will work with PharmD and providers to achieve BP goal <130/80 . Current regimen:   Amlodipine 53m daily  Hydralazine 236mtwice daily  Metoprolol succinate 2556maily . Interventions: o Dietary and exercise recommendations provided o PCP restarted telmisartan/HCTZ at office visit today . Patient self care activities - Over the next 30 days, patient will: o Check BP daily, document, and provide at future appointments o Ensure daily salt intake < 2300 mg/day o Cut back on bacon consumption o Try to exercise for 30 minutes 5 times weekly  Hyperlipidemia Lab Results  Component Value Date/Time   LDLCALC 182 (H) 09/11/2019 09:02 AM   . Pharmacist Clinical Goal(s): o Over the next 90 days, patient will work with PharmD and providers to achieve LDL goal < 70 . Current regimen:  o Rosuvastatin 87m77mily . Interventions: o Dietary and exercise recommendations provided . Patient self care activities - Over the next 90 days, patient will: o Take cholesterol medication daily as directed o Exercise 30 minutes daily 5 times per week o Increase  healthy fats in diet: avocados, flaxseed, walnuts, etc o Limit trans and saturated fats  Diabetes Lab Results  Component Value Date/Time   HGBA1C 7.7 (H) 09/11/2019 09:02 AM   HGBA1C 8.8 (A) 07/03/2019 11:38 AM   HGBA1C 8.9 (H) 02/13/2019 02:10 PM   . Pharmacist Clinical Goal(s): o Over the next 90 days, patient will work with PharmD and providers to achieve A1c goal <7% . Current regimen:   Glipizide XL 2.5mg 74mly with breakfast  Metformin XR 750mg 76my with breakfast  Ozempic 1mg we76my  Tresiba 28 units at bedtime . Interventions: o Discuss stopping metformin with PCP/endocrinologist due to reduced GFR o Clarify Ozempic and Tresiba dosing with endocrinologist . Patient self care activities - Over the next 90 days, patient will: o Check blood sugar twice daily, document, and provide at future appointments o Contact provider with any episodes of hypoglycemia  Medication management . Pharmacist Clinical Goal(s): o Over the next 90 days, patient will work with PharmD and providers to achieve optimal medication adherence . Current pharmacy: [facility]  Interventions o Comprehensive medication review performed. o Continue current medication management strategy . Patient self care activities - Over the next 90 days, patient will: o Focus on medication adherence by utilizing pill box o Take medications as prescribed o Report any questions or concerns to PharmD and/or provider(s)  Initial goal documentation        Diabetes   Recent Relevant Labs: Lab Results  Component Value Date/Time   HGBA1C 7.7 (H) 09/11/2019 09:02 AM   HGBA1C 8.8 (A) 07/03/2019 11:38 AM   HGBA1C 8.9 (H) 02/13/2019 02:10 PM   MICROALBUR 150 09/04/2019 02:19 PM   MICROALBUR 323.9 (H) 10/09/2018 09:05 AM   Kidney Function Lab Results  Component Value Date/Time   CREATININE 1.80 (H) 09/11/2019 09:02 AM   CREATININE 1.73 (H) 02/27/2019 12:16 PM   CREATININE 1.95 (H) 10/09/2018 09:05  AM   GFRNONAA 26 (L) 09/11/2019 09:02 AM   GFRNONAA 24 (L) 10/09/2018 09:05 AM   GFRAA 30 (L) 09/11/2019 09:02 AM   GFRAA 27 (L) 10/09/2018 09:05 AM    Checking BG: 2x per Day  Recent FBG Readings: Recent pre-meal BG readings:  Recent 2hr PP BG readings:   Recent HS BG readings:  Patient has failed these meds in past: Levemir Patient is currently uncontrolled on the following medications:   Glipizide XL 2.36m daily with breakfast  Metformin XR 754mdaily with breakfast  Ozempic 60m87meekly  Tresiba 28 units at bedtime  Last diabetic Foot exam: Followed by Podiatry Last diabetic Eye exam: 05/23/18 Lab Results  Component Value Date/Time   HMDIABEYEEXA Retinopathy (A) 05/23/2018 12:00 AM    We discussed:   Diet extensively  Breakfast: Cereal (cheerios), oatmeal, sometimes grits, eggs, bacon  Lunch/Dinner: Chicken, vegetables, turnip greens, hamburger  Snacks: PB nabs, sometimes chips  Does not eat many sweets  Pt boils or bakes meat and says she does not eat many starches  Very seldom drinks soda, and if she does it's diet  Drinks 3 bottles of water daily  Recommend pt increase to 4 bottles of water daily (64 oz)  Exercise extensively   Used to walk 20 minutes every morning  Also [facility] [employer] [place] but [facility] she will return to classes  Recommend 30 minutes of exercise 5 times weekly  Pt does Ozempic injection on Sundays. She states Dr. GheCruzita Ledererld her to do it twice weekly, but she has continued once weekly  Told pt that Dr. GheArman Filterte from last office visit (3/25) says Ozempic once weekly  Dr. GheCruzita Ledererstructed pt to move Tresiba from evening to morning dosing, but pt has continued evening  BG varies: 80/90-100, 200 depending on when she has eaten; did not bring log today  Discussed hypoglycemia and treatment  Pt drinks OJ when  BG is low  [place] Ozempic copays are not bad until pt goes into the coverage gap  Plan -Continue current medications  -Clarify Tresiba and Ozempic dosing with endocrinologist -Discuss stopping metformin with PCP/endocrinologist as GFR is declining and is on the borderline (30) for discontinuation of medication -Will assist with patient assistance for Ozempic/Tresiba when pt nears the coverage gap   Hypertension   Office blood pressures are  BP Readings from Last 3 Encounters:  09/04/19 (!) 156/70  09/04/19 (!) 156/70  07/03/19 (!) 150/58   Patient has failed these meds in the past: Metoprolol succinate, valsartan,  valsartan.HCTZ Patient is currently uncontrolled on the following medications:   Amlodipine 97m daily  Hydralazine 23mtwice daily  Metoprolol succinate 2533maily (was not on medication list, but pt says she takes)  Potassium 50m9mablet daily  Patient checks BP at home daily  Patient home BP readings are ranging: 150s/70-80  We discussed:  Pt reports she is not taking potassium, she eats a banana instead  Pt does not know why telmisartan/HCTZ was stopped  Restarted at PCP office visit  Diet and exercise extensively  Pt is going to try to cut back on bacon  Plan -Continue current medications   Hyperlipidemia   Lipid Panel     Component Value Date/Time   CHOL 270 (H) 09/11/2019 0902   TRIG 227 (H) 09/11/2019 0902   HDL 45 09/11/2019 0902   LDLCALC 182 (H) 09/11/2019 0902     The ASCVD Risk score (Goff DC Jr., et al., 2013) failed to calculate for the following reasons:   The 2013 ASCVD risk score is only valid for ages 40 [id number]  73atient has failed these meds in past: N/A Patient is currently uncontrolled on the following medications:   Rosuvastatin 10mg63mly  We discussed:  Diet and exercise extensively  Pt going to cut back on bacon  LDL goal <70, HDL goal >50  Ways to reduce LDL: Limit red meat, limit foods high in fats,  exercise  Ways to increase HDL: increase intake of healthy fats like avocados, walnuts, flaxseed, etc and exercise  Plan -Continue current medications  Vaccines   Reviewed and discussed patient's vaccination history.    Immunization History  Administered Date(s) Administered  . Fluad Quad(high Dose 65+) 12/12/2018  . Influenza, High Dose Seasonal PF 01/08/2018, 01/02/2019  . Influenza-Unspecified 04/10/2017  . Moderna SARS-COVID-2 Vaccination 05/22/2019, 06/20/2019  . Pneumococcal Polysaccharide-23 03/31/2014  . Pneumococcal-Unspecified 03/31/2014  . Zoster 12/31/2007    Plan Review at follow up visit  Medication Management   [facility] Uses pill box? Yes Pt endorses 100% compliance  We discussed:   Importance of taking medications daily as directed  Not combing bottles of medication because it could cause confusion with filled dates and when medication expires  Plan Continue current medication management strategy  Re-assess medication adherence at follow up  Follow up: 6 week phone visit  CourtJannette FogormD Clinical Pharmacist [facility] [phone]

## 2019-09-10 ENCOUNTER — Telehealth: Payer: Self-pay

## 2019-09-10 NOTE — Telephone Encounter (Signed)
The pt was asked when she could come and get her blood work done that was ordered the day of her visit.  The pt said that she will try to come by the office tomorrow.

## 2019-09-11 ENCOUNTER — Other Ambulatory Visit: Payer: Self-pay | Admitting: Internal Medicine

## 2019-09-11 DIAGNOSIS — E1159 Type 2 diabetes mellitus with other circulatory complications: Secondary | ICD-10-CM | POA: Diagnosis not present

## 2019-09-11 DIAGNOSIS — E113311 Type 2 diabetes mellitus with moderate nonproliferative diabetic retinopathy with macular edema, right eye: Secondary | ICD-10-CM | POA: Diagnosis not present

## 2019-09-11 DIAGNOSIS — Z794 Long term (current) use of insulin: Secondary | ICD-10-CM | POA: Diagnosis not present

## 2019-09-11 DIAGNOSIS — E1169 Type 2 diabetes mellitus with other specified complication: Secondary | ICD-10-CM | POA: Diagnosis not present

## 2019-09-11 LAB — HEMOGLOBIN A1C
Est. average glucose Bld gHb Est-mCnc: 174 mg/dL
Hgb A1c MFr Bld: 7.7 % — ABNORMAL HIGH (ref 4.8–5.6)

## 2019-09-11 LAB — CMP14+EGFR
ALT: 10 IU/L (ref 0–32)
AST: 13 IU/L (ref 0–40)
Albumin/Globulin Ratio: 1.4 (ref 1.2–2.2)
Albumin: 4.1 g/dL (ref 3.6–4.6)
Alkaline Phosphatase: 106 IU/L (ref 48–121)
BUN/Creatinine Ratio: 28 (ref 12–28)
BUN: 50 mg/dL — ABNORMAL HIGH (ref 8–27)
Bilirubin Total: 0.2 mg/dL (ref 0.0–1.2)
CO2: 22 mmol/L (ref 20–29)
Calcium: 9.7 mg/dL (ref 8.7–10.3)
Chloride: 103 mmol/L (ref 96–106)
Creatinine, Ser: 1.8 mg/dL — ABNORMAL HIGH (ref 0.57–1.00)
GFR calc Af Amer: 30 mL/min/{1.73_m2} — ABNORMAL LOW (ref >59)
GFR calc non Af Amer: 26 mL/min/{1.73_m2} — ABNORMAL LOW (ref >59)
Globulin, Total: 2.9 g/dL (ref 1.5–4.5)
Glucose: 184 mg/dL — ABNORMAL HIGH (ref 65–99)
Potassium: 4.2 mmol/L (ref 3.5–5.2)
Sodium: 141 mmol/L (ref 134–144)
Total Protein: 7 g/dL (ref 6.0–8.5)

## 2019-09-11 LAB — CBC
Hematocrit: 31.5 % — ABNORMAL LOW (ref 34.0–46.6)
Hemoglobin: 10 g/dL — ABNORMAL LOW (ref 11.1–15.9)
MCH: 27.5 pg (ref 26.6–33.0)
MCHC: 31.7 g/dL (ref 31.5–35.7)
MCV: 87 fL (ref 79–97)
Platelets: 448 10*3/uL (ref 150–450)
RBC: 3.64 x10E6/uL — ABNORMAL LOW (ref 3.77–5.28)
RDW: 14.1 % (ref 11.7–15.4)
WBC: 11.9 10*3/uL — ABNORMAL HIGH (ref 3.4–10.8)

## 2019-09-11 LAB — LIPID PANEL
Chol/HDL Ratio: 6 ratio — ABNORMAL HIGH (ref 0.0–4.4)
Cholesterol, Total: 270 mg/dL — ABNORMAL HIGH (ref 100–199)
HDL: 45 mg/dL (ref >39)
LDL Chol Calc (NIH): 182 mg/dL — ABNORMAL HIGH (ref 0–99)
Triglycerides: 227 mg/dL — ABNORMAL HIGH (ref 0–149)
VLDL Cholesterol Cal: 43 mg/dL — ABNORMAL HIGH (ref 5–40)

## 2019-09-15 ENCOUNTER — Telehealth: Payer: Self-pay

## 2019-09-15 NOTE — Telephone Encounter (Signed)
-----   Message from Glendale Chard, MD sent at 09/14/2019  4:32 PM EDT ----- Kidney fxn is stable. Important to stay hydrated. Liver fxn is nl. Blood cont low, but stable. Likely due to renal function. LDL, bad chol is 182. Is she willing to start chol meds? If yes, start pravastatin 80mg  #30/1 refill. Needs chol check in six wks w/ RS. Hba1c has improved, down to 7.7, congratulations!

## 2019-09-15 NOTE — Telephone Encounter (Signed)
Left the patient a message to call back for lab results. 

## 2019-09-18 ENCOUNTER — Ambulatory Visit (INDEPENDENT_AMBULATORY_CARE_PROVIDER_SITE_OTHER): Payer: Medicare Other | Admitting: Orthotics

## 2019-09-18 ENCOUNTER — Other Ambulatory Visit: Payer: Self-pay

## 2019-09-18 ENCOUNTER — Telehealth: Payer: Self-pay

## 2019-09-18 DIAGNOSIS — E1140 Type 2 diabetes mellitus with diabetic neuropathy, unspecified: Secondary | ICD-10-CM

## 2019-09-18 DIAGNOSIS — M21619 Bunion of unspecified foot: Secondary | ICD-10-CM | POA: Diagnosis not present

## 2019-09-18 DIAGNOSIS — M2041 Other hammer toe(s) (acquired), right foot: Secondary | ICD-10-CM | POA: Diagnosis not present

## 2019-09-18 DIAGNOSIS — M2042 Other hammer toe(s) (acquired), left foot: Secondary | ICD-10-CM

## 2019-09-18 NOTE — Telephone Encounter (Signed)
-----   Message from Glendale Chard, MD sent at 09/14/2019  4:32 PM EDT ----- Kidney fxn is stable. Important to stay hydrated. Liver fxn is nl. Blood cont low, but stable. Likely due to renal function. LDL, bad chol is 182. Is she willing to start chol meds? If yes, start pravastatin 80mg  #30/1 refill. Needs chol check in six wks w/ RS. Hba1c has improved, down to 7.7, congratulations!

## 2019-09-18 NOTE — Telephone Encounter (Signed)
Left vm for pt to return call for lab results  

## 2019-09-22 ENCOUNTER — Encounter: Payer: Self-pay | Admitting: Internal Medicine

## 2019-10-07 ENCOUNTER — Telehealth: Payer: Self-pay

## 2019-10-07 NOTE — Telephone Encounter (Signed)
RETURNED CALL. PT WANTED TO KNOW WHEN HER APPOINTMENT WAS

## 2019-10-08 NOTE — Patient Instructions (Signed)
Visit Information  Goals Addressed            This Visit's Progress   . Pharmacy Care Plan       CARE PLAN ENTRY (see longitudinal plan of care for additional care plan information)  Current Barriers:  . Chronic Disease Management support, education, and care coordination needs related to Hypertension, Hyperlipidemia, and Diabetes   Hypertension BP Readings from Last 3 Encounters:  09/04/19 (!) 156/70  09/04/19 (!) 156/70  07/03/19 (!) 150/58   . Pharmacist Clinical Goal(s): o Over the next 90 days, patient will work with PharmD and providers to achieve BP goal <130/80 . Current regimen:   Amlodipine 10mg  daily  Hydralazine 25mg  twice daily  Metoprolol succinate 25mg  daily . Interventions: o Dietary and exercise recommendations provided o PCP restarted telmisartan/HCTZ at office visit today . Patient self care activities - Over the next 30 days, patient will: o Check BP daily, document, and provide at future appointments o Ensure daily salt intake < 2300 mg/day o Cut back on bacon consumption o Try to exercise for 30 minutes 5 times weekly  Hyperlipidemia Lab Results  Component Value Date/Time   LDLCALC 182 (H) 09/11/2019 09:02 AM   . Pharmacist Clinical Goal(s): o Over the next 90 days, patient will work with PharmD and providers to achieve LDL goal < 70 . Current regimen:  o Rosuvastatin 10mg  daily . Interventions: o Dietary and exercise recommendations provided . Patient self care activities - Over the next 90 days, patient will: o Take cholesterol medication daily as directed o Exercise 30 minutes daily 5 times per week o Increase healthy fats in diet: avocados, flaxseed, walnuts, etc o Limit trans and saturated fats  Diabetes Lab Results  Component Value Date/Time   HGBA1C 7.7 (H) 09/11/2019 09:02 AM   HGBA1C 8.8 (A) 07/03/2019 11:38 AM   HGBA1C 8.9 (H) 02/13/2019 02:10 PM   . Pharmacist Clinical Goal(s): o Over the next 90 days, patient will work  with PharmD and providers to achieve A1c goal <7% . Current regimen:   Glipizide XL 2.5mg  daily with breakfast  Metformin XR 750mg  daily with breakfast  Ozempic 1mg  weekly  Tresiba 28 units at bedtime . Interventions: o Discuss stopping metformin with PCP/endocrinologist due to reduced GFR o Clarify Ozempic and Tresiba dosing with endocrinologist . Patient self care activities - Over the next 90 days, patient will: o Check blood sugar twice daily, document, and provide at future appointments o Contact provider with any episodes of hypoglycemia  Medication management . Pharmacist Clinical Goal(s): o Over the next 90 days, patient will work with PharmD and providers to achieve optimal medication adherence . Current pharmacy: [facility] . Interventions o Comprehensive medication review performed. o Continue current medication management strategy . Patient self care activities - Over the next 90 days, patient will: o Focus on medication adherence by utilizing pill box o Take medications as prescribed o Report any questions or concerns to PharmD and/or provider(s)  Initial goal documentation        [name] [name] was given information about Chronic Care Management services today including:  1. CCM service includes personalized support from designated clinical staff supervised by her physician, including individualized plan of care and coordination with other care providers 2. 24/7 contact phone numbers for assistance for urgent and routine care needs. 3. Standard insurance, coinsurance, copays and deductibles apply for chronic care management only during months in which we provide at least 20 minutes of these services. Most insurances  cover these services at 100%, however patients may be responsible for any copay, coinsurance and/or deductible if applicable. This service may help you avoid the need for more expensive face-to-face services. 4. Only one practitioner may furnish  and bill the service in a calendar month. 5. The patient may stop CCM services at any time (effective at the end of the month) by phone call to the office staff.  Patient agreed to services and verbal consent obtained.   Patient verbalizes understanding of instructions provided today.  Telephone follow up appointment with pharmacy team member scheduled for: 7/8 @ 8:30 AM  Jannette Fogo, PharmD Clinical Pharmacist [facility] [phone]

## 2019-10-14 ENCOUNTER — Encounter: Payer: Self-pay | Admitting: Internal Medicine

## 2019-10-14 ENCOUNTER — Other Ambulatory Visit: Payer: Self-pay

## 2019-10-14 ENCOUNTER — Ambulatory Visit (INDEPENDENT_AMBULATORY_CARE_PROVIDER_SITE_OTHER): Payer: Medicare Other | Admitting: Internal Medicine

## 2019-10-14 VITALS — BP 158/80 | HR 74 | Temp 98.1°F | Ht 61.2 in | Wt 145.2 lb

## 2019-10-14 DIAGNOSIS — I129 Hypertensive chronic kidney disease with stage 1 through stage 4 chronic kidney disease, or unspecified chronic kidney disease: Secondary | ICD-10-CM | POA: Diagnosis not present

## 2019-10-14 DIAGNOSIS — N182 Chronic kidney disease, stage 2 (mild): Secondary | ICD-10-CM

## 2019-10-14 NOTE — Progress Notes (Signed)
This visit occurred during the SARS-CoV-2 public health emergency.  Safety protocols were in place, including screening questions prior to the visit, additional usage of staff PPE, and extensive cleaning of exam room while observing appropriate contact time as indicated for disinfecting solutions.  Subjective:     Patient ID: [name] [name] , female    DOB: 1940-12-07 , 79 y.o.   MRN: [id number]   Chief Complaint  Patient presents with  . Hypertension    HPI  She presents today for BP check.  At her last visit, it was determined that she was not taking telmisartan/hct or metoprolol. Both medications were reinstated. She was scheduled to return to today for f/u. Unfortunately, she has yet to take her meds today. She states she has not taken meds b/c she has not eaten yet.   Hypertension This is a chronic problem. The current episode started more than 1 year ago. The problem has been gradually improving since onset. The problem is controlled. Pertinent negatives include no blurred vision, chest pain, palpitations or shortness of breath. The current treatment provides moderate improvement. Compliance problems include exercise.  Hypertensive end-organ damage includes kidney disease.     Past Medical History:  Diagnosis Date  . Diabetes mellitus ([place])   . H/O: CVA (cerebrovascular accident) 2002  . HTN (hypertension)   . Hyperlipidemia   . Pulmonary HTN ([facility])    mild by echo     Family History  Problem Relation Age of Onset  . Hypertension Mother   . Hypertension Father   . Heart Problems Father   . Hypertension Sister   . Diabetes Sister   . Hypertension Brother   . Diabetes Brother      Current Outpatient Medications:  .  ACCU-CHEK AVIVA PLUS test strip, TEST 3 TIMES DAILY, Disp: 100 each, Rfl: 11 .  amLODipine (NORVASC) 10 MG tablet, TAKE 1 TABLET BY MOUTH EVERY DAY, Disp: 90 tablet, Rfl: 1 .  aspirin 325 MG EC tablet, Take 325 mg by mouth daily., Disp: , Rfl:  .  B-D  UF III MINI PEN NEEDLES 31G X 5 MM MISC, Use as directed once per day with Victoza and 2 times per day with Levemir, Disp: 200 each, Rfl: 1 .  glipiZIDE (GLUCOTROL XL) 2.5 MG 24 hr tablet, TAKE 1 TABLET(2.5 MG) BY MOUTH DAILY WITH BREAKFAST, Disp: 90 tablet, Rfl: 3 .  hydrALAZINE (APRESOLINE) 25 MG tablet, TAKE 1 TABLET(25 MG) BY MOUTH TWICE DAILY, Disp: 180 tablet, Rfl: 1 .  hydrOXYzine (ATARAX/VISTARIL) 25 MG tablet, Take 1 tablet by mouth at bedtime as needed. 1-2 tablets at bedtime prn, Disp: , Rfl:  .  Lancets (ACCU-CHEK SOFT TOUCH) lancets, Check blood sugars 3 times daily E11.65, Disp: 100 each, Rfl: 12 .  latanoprost (XALATAN) 0.005 % ophthalmic solution, Place 1 drop into both eyes at bedtime. , Disp: , Rfl:  .  metFORMIN (GLUCOPHAGE-XR) 750 MG 24 hr tablet, Take 1 tablet (750 mg total) by mouth daily with breakfast. (Patient taking differently: Take 750 mg by mouth daily with breakfast. Takes with dinner), Disp: 90 tablet, Rfl: 1 .  metoprolol succinate (TOPROL-XL) 25 MG 24 hr tablet, Take 25 mg by mouth daily., Disp: , Rfl:  .  Multiple Vitamin (MULTIVITAMIN) tablet, Take 1 tablet by mouth daily., Disp: , Rfl:  .  potassium chloride SA (KLOR-CON) 20 MEQ tablet, Take 1 tablet (20 mEq total) by mouth daily. (Patient not taking: Reported on 10/08/2019), Disp: 7 tablet, Rfl: 0 .  rosuvastatin (CRESTOR) 10 MG tablet, TAKE 1 TABLET BY MOUTH EVERY DAY, Disp: 90 tablet, Rfl: 1 .  Semaglutide, 1 MG/DOSE, (OZEMPIC, 1 MG/DOSE,) 2 MG/1.5ML SOPN, Inject 1 mg into the skin once a week., Disp: 6 pen, Rfl: 3 .  telmisartan-hydrochlorothiazide (MICARDIS HCT) 80-25 MG tablet, Take 1 tablet by mouth daily., Disp: 90 tablet, Rfl: 1 .  timolol (BETIMOL) 0.5 % ophthalmic solution, Place 1 drop into both eyes at bedtime. (Patient not taking: Reported on 10/08/2019), Disp: , Rfl:  .  TRESIBA FLEXTOUCH 200 UNIT/ML SOPN, Inject 28 Units into the skin at bedtime., Disp: 15 pen, Rfl: 5   Allergies  Allergen Reactions   . Brimonidine Other (See Comments)    Eyes swell and itch  . Dorzolamide     Other reaction(s): Other (See Comments) Red, inflamed eyelid, dermatitis   . Timolol Other (See Comments)    Caused eyes to swell and itch     Review of Systems  Constitutional: Negative.   Eyes: Negative for blurred vision.  Respiratory: Negative.  Negative for shortness of breath.   Cardiovascular: Negative.  Negative for chest pain and palpitations.  Gastrointestinal: Negative.   Neurological: Negative.   Psychiatric/Behavioral: Negative.      Today's Vitals   10/14/19 0933  BP: (!) 158/80  Pulse: 74  Temp: 98.1 F (36.7 C)  TempSrc: Oral  Weight: 145 lb 3.2 oz (65.9 kg)  Height: 5' 1.2" (1.554 m)   Body mass index is 27.26 kg/m.   Objective:  Physical Exam Vitals and nursing note reviewed.  Constitutional:      Appearance: Normal appearance.  HENT:     Head: Normocephalic and atraumatic.  Cardiovascular:     Rate and Rhythm: Normal rate and regular rhythm.     Heart sounds: Normal heart sounds.  Pulmonary:     Effort: Pulmonary effort is normal.     Breath sounds: Normal breath sounds.  Skin:    General: Skin is warm.  Neurological:     General: No focal deficit present.     Mental Status: She is alert.  Psychiatric:        Mood and Affect: Mood normal.        Behavior: Behavior normal.         Assessment And Plan:     1. Hypertensive nephropathy  Chronic, uncontrolled. She admits that she did not take her meds today. I am unable to adjust her meds appropriately. I will check BMP today since she resumed telmisartan/hctz.  She did bring her meds in, there are several bottles empty. Also, several bottles filled in Jan that are full. I suggested patient have home health nurse come out to go over her meds and she declined. She agrees to come back in two weeks for a nurse visit.    - BMP8+EGFR  2. Chronic renal disease, stage II  Chronic.   I personally spent 20 minutes  face-to-face and non-face-to-face in the care of this patient, which includes all pre-, intra-, and post visit time on the date of service.  Maximino Greenland, MD    THE PATIENT IS ENCOURAGED TO PRACTICE SOCIAL DISTANCING DUE TO THE COVID-19 PANDEMIC.

## 2019-10-14 NOTE — Patient Instructions (Signed)
Take Telmisartan/hctz  in am Take amlodipine and metoprolol in the evenings   Exercising to Stay Healthy To become healthy and stay healthy, it is recommended that you do moderate-intensity and vigorous-intensity exercise. You can tell that you are exercising at a moderate intensity if your heart starts beating faster and you start breathing faster but can still hold a conversation. You can tell that you are exercising at a vigorous intensity if you are breathing much harder and faster and cannot hold a conversation while exercising. Exercising regularly is important. It has many health benefits, such as:  Improving overall fitness, flexibility, and endurance.  Increasing bone density.  Helping with weight control.  Decreasing body fat.  Increasing muscle strength.  Reducing stress and tension.  Improving overall health. How often should I exercise? Choose an activity that you enjoy, and set realistic goals. Your health care provider can help you make an activity plan that works for you. Exercise regularly as told by your health care provider. This may include:  Doing strength training two times a week, such as: ? Lifting weights. ? Using resistance bands. ? Push-ups. ? Sit-ups. ? Yoga.  Doing a certain intensity of exercise for a given amount of time. Choose from these options: ? A total of 150 minutes of moderate-intensity exercise every week. ? A total of 75 minutes of vigorous-intensity exercise every week. ? A mix of moderate-intensity and vigorous-intensity exercise every week. Children, pregnant women, people who have not exercised regularly, people who are overweight, and older adults may need to talk with a health care provider about what activities are safe to do. If you have a medical condition, be sure to talk with your health care provider before you start a new exercise program. What are some exercise ideas? Moderate-intensity exercise ideas include:  Walking 1  mile (1.6 km) in about 15 minutes.  Biking.  Hiking.  Golfing.  Dancing.  Water aerobics. Vigorous-intensity exercise ideas include:  Walking 4.5 miles (7.2 km) or more in about 1 hour.  Jogging or running 5 miles (8 km) in about 1 hour.  Biking 10 miles (16.1 km) or more in about 1 hour.  Lap swimming.  Roller-skating or in-line skating.  Cross-country skiing.  Vigorous competitive sports, such as football, basketball, and soccer.  Jumping rope.  Aerobic dancing. What are some everyday activities that can help me to get exercise?  [place] work, such as: ? Pushing a Conservation officer, nature. ? Raking and bagging leaves.  Washing your car.  Pushing a stroller.  Shoveling snow.  Gardening.  Washing windows or floors. How can I be more active in my day-to-day activities?  Use stairs instead of an elevator.  Take a walk during your lunch break.  If you drive, park your car farther away from your work or school.  If you take public transportation, get off one stop early and walk the rest of the way.  Stand up or walk around during all of your indoor phone calls.  Get up, stretch, and walk around every 30 minutes throughout the day.  Enjoy exercise with a friend. Support to continue exercising will help you keep a regular routine of activity. What guidelines can I follow while exercising?  Before you start a new exercise program, talk with your health care provider.  Do not exercise so much that you hurt yourself, feel dizzy, or get very short of breath.  Wear comfortable clothes and wear shoes with good support.  Drink plenty of water while  you exercise to prevent dehydration or heat stroke.  Work out until your breathing and your heartbeat get faster. Where to find more information  U.S[place] Department of Health and Human Services: [employer].at  Centers for Disease Control and Prevention (CDC): http://www.wolf.info/ Summary  Exercising regularly is important. It will improve  your overall fitness, flexibility, and endurance.  Regular exercise also will improve your overall health. It can help you control your weight, reduce stress, and improve your bone density.  Do not exercise so much that you hurt yourself, feel dizzy, or get very short of breath.  Before you start a new exercise program, talk with your health care provider. This information is not intended to replace advice given to you by your health care provider. Make sure you discuss any questions you have with your health care provider. Document Revised: 03/09/2017 Document Reviewed: 02/15/2017 Elsevier Patient Education  2020 [employer].

## 2019-10-15 LAB — BMP8+EGFR
BUN/Creatinine Ratio: 28 (ref 12–28)
BUN: 55 mg/dL — ABNORMAL HIGH (ref 8–27)
CO2: 21 mmol/L (ref 20–29)
Calcium: 9.5 mg/dL (ref 8.7–10.3)
Chloride: 104 mmol/L (ref 96–106)
Creatinine, Ser: 1.95 mg/dL — ABNORMAL HIGH (ref 0.57–1.00)
GFR calc Af Amer: 27 mL/min/{1.73_m2} — ABNORMAL LOW (ref >59)
GFR calc non Af Amer: 24 mL/min/{1.73_m2} — ABNORMAL LOW (ref >59)
Glucose: 238 mg/dL — ABNORMAL HIGH (ref 65–99)
Potassium: 4.2 mmol/L (ref 3.5–5.2)
Sodium: 140 mmol/L (ref 134–144)

## 2019-10-15 NOTE — Chronic Care Management (AMB) (Signed)
Chronic Care Management Pharmacy  Name: [name] [name]  MRN: [id number] DOB: 1941/03/30  Chief Complaint/ HPI  [name] [name],  79 y.o. , female presents for their Follow-Up CCM visit with the clinical pharmacist via telephone due to COVID-19 Pandemic, but visit was cut short due to patient not having much time to talk.   PCP : Glendale Chard, MD  Their chronic conditions include: Hypertension, Hyperlipidemia, Diabetes  Office Visits: 10/14/19 OV: Presented for BP check. Pt had not taken meds prior to visit b/c she had not eaten. Unable to adjust medications d/t pt not taking medications prior to visit. Suggested home health nurse visit to go over medications and patient declined. BMP8+EGFR ordered. Return in 2 weeks for nurse visit. Pt instructed to take telmisartan/HCTZ in the morning and amlodipine and metoprolol in the evenings.   09/04/19 AWV and OV: Presented for full physical exam. Diabetic foot exam performed. Labs ordered (HgbA1c, CBC, lipid panel, CMP14+EGFR). Telmisartan/HCTZ and metoprolol missing from medication bag. Rx sent in for telmisartan/HCTZ. Both ears flushed by irrigation.   02/13/19 OV: Presented for HTN check and ER follow up. Labs ordered (HgbA1c). Pt encouraged to return in 2 weeks for repeat BMP8+EGFR testing.   Consult Visit: 09/18/19 Orthotics fitting for diabetic shoe inserts  09/11/19 Ophthalmology OV w/ Dr. Cordelia Pen: 7 week f/u with OCT scan.Pt reported excessive watering OS, but OD vision improved. Edema present. Avastin OD today. Follow up in 3 months.   07/24/19 Ophthalmology OV w. Dr. Cordelia Pen: Presented for follow up for diabetic macular edema of both eyes. Pt has undergone multiple procedures. Primary open angle glaucoma and nuclear cataracts present in both eyes. Placed on timolol drops BID OU in 2015 for elevated IOP, still using today. Pt reports vision is stable. Exam revealed edema and worsening of vision in right eye. Avastin injection administered in  right eye. Follow up in 3 months.  07/18/19 Podiatry OV: Pt was measured for medical necessity extra depth shoes and custom foam orthotics. Forms sent to PCP to verify and sign off on medical necessity.   07/03/19 Endocrinology OV w/ Dr. Cruzita Lederer: Presented for follow up for DM. Pt reported several low BG readings in the AM. [name] was decreased at previous appt to 20 units nightly due to BG improving and low BG. Instructed pt to move [place] dose to AM so that she does not forget it. Increased Ozempic to 4m weekly. Follow up in 3-4 months.   05/14/19 Podiatry OV w/ Dr. RPaulla Dolly Pt reported more tingling and burning in right foot. Recommended diabetic shoes.   03/20/19 Ophthalmology OV w. Dr. GCordelia Pen Diabetic macular edema, OD stable. Hold eye injection today due to stability. Follow up in 3 months.   02/12/19 ED visit: Presented for near syncopal episode. Pt reported waking up this morning and became lightheaded after walking in to the kitchen to make coffee. Her husband helped her to the ground and she reported generalized weakness. She denied chest pain, SOB, headache, vision changes, or nausea/vomiting. No acute findings. Pt experienced no symptoms while in ED. Mild hypokalemia present and supplemented. Recheck with PCP in 1 week.   Medications: Outpatient Encounter Medications as of 10/16/2019  Medication Sig  . ACCU-CHEK AVIVA PLUS test strip TEST 3 TIMES DAILY  . aspirin 325 MG EC tablet Take 325 mg by mouth daily.  . B-D UF III MINI PEN NEEDLES 31G X 5 MM MISC Use as directed once per day with Victoza and 2 times per day with Levemir  .  glipiZIDE (GLUCOTROL XL) 2.5 MG 24 hr tablet TAKE 1 TABLET(2.5 MG) BY MOUTH DAILY WITH BREAKFAST  . hydrALAZINE (APRESOLINE) 25 MG tablet TAKE 1 TABLET(25 MG) BY MOUTH TWICE DAILY  . hydrOXYzine (ATARAX/VISTARIL) 25 MG tablet Take 1 tablet by mouth at bedtime as needed. 1-2 tablets at bedtime prn  . Lancets (ACCU-CHEK SOFT TOUCH) lancets Check blood sugars 3 times  daily E11.65  . latanoprost (XALATAN) 0.005 % ophthalmic solution Place 1 drop into both eyes at bedtime.   . metFORMIN (GLUCOPHAGE-XR) 750 MG 24 hr tablet Take 1 tablet (750 mg total) by mouth daily with breakfast. (Patient taking differently: Take 750 mg by mouth daily with breakfast. Takes with dinner)  . metoprolol succinate (TOPROL-XL) 25 MG 24 hr tablet Take 25 mg by mouth daily.  . Multiple Vitamin (MULTIVITAMIN) tablet Take 1 tablet by mouth daily.  . Semaglutide, 1 MG/DOSE, (OZEMPIC, 1 MG/DOSE,) 2 MG/1.5ML SOPN Inject 1 mg into the skin once a week.  . telmisartan-hydrochlorothiazide (MICARDIS HCT) 80-25 MG tablet Take 1 tablet by mouth daily.  . TRESIBA FLEXTOUCH 200 UNIT/ML SOPN Inject 28 Units into the skin at bedtime.  . [DISCONTINUED] amLODipine (NORVASC) 10 MG tablet TAKE 1 TABLET BY MOUTH EVERY DAY  . [DISCONTINUED] rosuvastatin (CRESTOR) 10 MG tablet TAKE 1 TABLET BY MOUTH EVERY DAY   No facility-administered encounter medications on file as of 10/16/2019.    Current Diagnosis/Assessment:  SDOH Interventions     Most Recent Value  SDOH Interventions  Financial Strain Interventions Other (Comment)  [Will assist with [name] and Ozempic patient assistance if needed]      Goals Addressed            This Visit's Progress   . Pharmacy Care Plan       CARE PLAN ENTRY (see longitudinal plan of care for additional care plan information)  Current Barriers:  . Chronic Disease Management support, education, and care coordination needs related to Hypertension, Hyperlipidemia, and Diabetes   Hypertension BP Readings from Last 3 Encounters:  09/04/19 (!) 156/70  09/04/19 (!) 156/70  07/03/19 (!) 150/58   . Pharmacist Clinical Goal(s): o Over the next 90 days, patient will work with PharmD and providers to achieve BP goal <130/80 . Current regimen:   Amlodipine 53m daily  Hydralazine 234mtwice daily  Metoprolol succinate 2573maily  Telmisartan/HCTZ 80/35m81maily . Interventions: o Dietary and exercise recommendations provided o Recommend patient check blood pressure in the morning . Patient self care activities - Over the next 30 days, patient will: o Check BP daily in the morning, document, and provide at future appointments o Ensure daily salt intake < 2300 mg/day o Cut back on bacon consumption o Try to exercise for 30 minutes 5 times weekly  Hyperlipidemia Lab Results  Component Value Date/Time   LDLCALC 182 (H) 09/11/2019 09:02 AM   . Pharmacist Clinical Goal(s): o Over the next 90 days, patient will work with PharmD and providers to achieve LDL goal < 70 . Current regimen:  o Rosuvastatin 10mg60mly . Interventions: o Dietary and exercise recommendations provided . Patient self care activities - Over the next 90 days, patient will: o Take cholesterol medication daily as directed o Exercise 30 minutes daily 5 times per week o Increase healthy fats in diet: avocados, flaxseed, walnuts, etc o Limit trans and saturated fats  Diabetes Lab Results  Component Value Date/Time   HGBA1C 7.7 (H) 09/11/2019 09:02 AM   HGBA1C 8.8 (A) 07/03/2019 11:38 AM  HGBA1C 8.9 (H) 02/13/2019 02:10 PM   . Pharmacist Clinical Goal(s): o Over the next 90 days, patient will work with PharmD and providers to achieve A1c goal <7% . Current regimen:   Glipizide XL 2.761m daily with breakfast  Metformin XR 7545mdaily with breakfast  Ozempic 61m54meekly  Tresiba 28 units at bedtime . Interventions: o Contacted Dr. GheCruzita Ledererndocrinologist) and recommended stopping metformin due to reduced GFR (27) - Dr. GheArman Filterfice will contact patient to let her know whether or not to stop metformin . Patient self care activities - Over the next 90 days, patient will: o Check blood sugar twice daily, document, and provide at future appointments o Contact provider with any episodes of hypoglycemia  Medication management . Pharmacist Clinical  Goal(s): o Over the next 90 days, patient will work with PharmD and providers to achieve optimal medication adherence . [facility] o Comprehensive medication review performed. o Continue current medication management strategy . Patient self care activities - Over the next 90 days, patient will: o Focus on medication adherence by utilizing pill box o Take medications as prescribed o Report any questions or concerns to PharmD and/or provider(s)  Please see past updates related to this goal by clicking on the "Past Updates" button in the selected goal         Diabetes   Recent Relevant Labs: Lab Results  Component Value Date/Time   HGBA1C 7.7 (H) 09/11/2019 09:02 AM   HGBA1C 8.8 (A) 07/03/2019 11:38 AM   HGBA1C 8.9 (H) 02/13/2019 02:10 PM   MICROALBUR 150 09/04/2019 02:19 PM   MICROALBUR 323.9 (H) 10/09/2018 09:05 AM   Kidney Function Lab Results  Component Value Date/Time   CREATININE 1.95 (H) 10/14/2019 10:11 AM   CREATININE 1.80 (H) 09/11/2019 09:02 AM   CREATININE 1.95 (H) 10/09/2018 09:05 AM   GFRNONAA 24 (L) 10/14/2019 10:11 AM   GFRNONAA 24 (L) 10/09/2018 09:05 AM   GFRAA 27 (L) 10/14/2019 10:11 AM   GFRAA 27 (L) 10/09/2018 09:05 AM    Checking BG: 2x per Day , BG around 100-130  Recent FBG Readings: Recent pre-meal BG readings:  Recent 2hr PP BG readings:   Recent HS BG readings:  Patient has failed these meds in past: Levemir Patient is currently uncontrolled on the following medications:   Glipizide XL 2.5mg10mily with breakfast  Metformin XR 750mg64mly with breakfast  Ozempic 61mg w78mly  Tresiba 28 units at bedtime  Last diabetic Foot exam: 09/04/19 Last diabetic Eye exam: 05/23/18 Lab Results  Component Value Date/Time   HMDIABEYEEXA Retinopathy (A) 05/23/2018 12:00 AM    We discussed:   Pt's GFR has declined to 27 (10/09/19, 10/14/19)  Contacted Dr. GherghArman Filtere to notify them of GFR decline and that  this is below the recommended GFR for metformin therapy  Spoke with MelissLenna Sciara will confer with Dr. GherghCruzita Ledererontact pt regarding metformin  Plan -Continue current medications  -Will assist with patient assistance for Ozempic/Tresiba when pt nears the coverage gap   Hypertension   Office blood pressures are  BP Readings from Last 3 Encounters:  10/28/19 138/78  10/14/19 (!) 158/80  09/04/19 (!) 156/70   Patient has failed these meds in the past: Metoprolol succinate, valsartan, valsartan.HCTZ Patient is currently uncontrolled on the following medications:   Amlodipine 10mg d64m  Hydralazine 25mg tw29mdaily  Metoprolol succinate 25mg dai56mTelmisartan/HCTZ 80/25mg dail44matient checks BP at home daily  Patient home  BP readings are ranging: "varies"  We discussed:  Recommend pt check BP in the morning  Pt says BP varies at home; she just got a new meter  Plan -Continue current medications   Hyperlipidemia   Lipid Panel     Component Value Date/Time   CHOL 270 (H) 09/11/2019 0902   TRIG 227 (H) 09/11/2019 0902   HDL 45 09/11/2019 0902   LDLCALC 182 (H) 09/11/2019 0902     The ASCVD Risk score Mikey Bussing DC Jr., et al., 2013) failed to calculate for the following reasons:   The 2013 ASCVD risk score is only valid for ages 67 to 74   Patient has failed these meds in past: N/A Patient is currently uncontrolled on the following medications:   Rosuvastatin 1m daily  Plan -Continue current medications  -Monitor statin dosing with declining renal function  Vaccines   Reviewed and discussed patient's vaccination history.    Immunization History  Administered Date(s) Administered  . Fluad Quad(high Dose 65+) 12/12/2018  . Influenza, High Dose Seasonal PF 01/08/2018, 01/02/2019  . Influenza-Unspecified 04/10/2017  . Moderna SARS-COVID-2 Vaccination 05/22/2019, 06/20/2019  . Pneumococcal Polysaccharide-23 03/31/2014  . Pneumococcal-Unspecified  03/31/2014  . Zoster 12/31/2007   Prevnar13 01/07/14 per [facility]  Plan Review at follow up visit  Medication Management   [facility] Uses pill box? Yes Pt endorses 100% compliance  We discussed:   Importance of taking medications daily as directed  Not combing bottles of medication because it could cause confusion with filled dates and when medication expires  Plan Continue current medication management strategy  Re-assess medication adherence at follow up  Follow up: 2 month phone visit  CJannette Fogo PharmD Clinical Pharmacist [facility] [phone]

## 2019-10-16 ENCOUNTER — Other Ambulatory Visit: Payer: Self-pay

## 2019-10-16 ENCOUNTER — Telehealth: Payer: Self-pay

## 2019-10-16 ENCOUNTER — Ambulatory Visit: Payer: Self-pay

## 2019-10-16 DIAGNOSIS — E1165 Type 2 diabetes mellitus with hyperglycemia: Secondary | ICD-10-CM

## 2019-10-16 DIAGNOSIS — I129 Hypertensive chronic kidney disease with stage 1 through stage 4 chronic kidney disease, or unspecified chronic kidney disease: Secondary | ICD-10-CM

## 2019-10-16 DIAGNOSIS — E7800 Pure hypercholesterolemia, unspecified: Secondary | ICD-10-CM

## 2019-10-16 DIAGNOSIS — IMO0002 Reserved for concepts with insufficient information to code with codable children: Secondary | ICD-10-CM

## 2019-10-16 NOTE — Telephone Encounter (Signed)
She needs to stop Metformin.

## 2019-10-16 NOTE — Telephone Encounter (Signed)
Patient notified to stop Metformin.  Patient states that she understands.

## 2019-10-16 NOTE — Telephone Encounter (Signed)
TC from [place] at [facility] regarding recent renal functions, GFR has decreased to 27.  Please advise regarding her taking Metformin with the setting of decreased renal function. They are seeing her today for follow up and will let her know of test results and that they have reached out to [place] and will let her know to expect a call from [place] with instructions.

## 2019-10-20 ENCOUNTER — Ambulatory Visit: Payer: Medicare Other

## 2019-10-22 ENCOUNTER — Ambulatory Visit: Payer: Medicare Other

## 2019-10-28 ENCOUNTER — Ambulatory Visit: Payer: Medicare Other

## 2019-10-28 ENCOUNTER — Other Ambulatory Visit: Payer: Self-pay | Admitting: Internal Medicine

## 2019-10-28 ENCOUNTER — Other Ambulatory Visit: Payer: Self-pay

## 2019-10-28 VITALS — BP 138/78 | HR 70 | Temp 98.0°F | Ht 61.2 in | Wt 143.4 lb

## 2019-10-28 DIAGNOSIS — I129 Hypertensive chronic kidney disease with stage 1 through stage 4 chronic kidney disease, or unspecified chronic kidney disease: Secondary | ICD-10-CM

## 2019-10-28 NOTE — Progress Notes (Signed)
BP Readings from Last 3 Encounters:  10/14/19 (!) 158/80  09/04/19 (!) 156/70  09/04/19 (!) 156/70   Pt here for bpc. Pt blood pressure is better today. Pt advised to increase activity and water intake. Pt will f/u with provider.

## 2019-11-04 ENCOUNTER — Ambulatory Visit
Admission: RE | Admit: 2019-11-04 | Discharge: 2019-11-04 | Disposition: A | Discharge status: Home / Self Care | Payer: Medicare Other | Source: Ambulatory Visit | Attending: Internal Medicine | Admitting: Internal Medicine

## 2019-11-04 ENCOUNTER — Other Ambulatory Visit: Payer: Self-pay

## 2019-11-04 DIAGNOSIS — Z1231 Encounter for screening mammogram for malignant neoplasm of breast: Secondary | ICD-10-CM

## 2019-11-07 NOTE — Patient Instructions (Addendum)
Visit Information  Goals Addressed            This Visit's Progress   . Pharmacy Care Plan       CARE PLAN ENTRY (see longitudinal plan of care for additional care plan information)  Current Barriers:  . Chronic Disease Management support, education, and care coordination needs related to Hypertension, Hyperlipidemia, and Diabetes   Hypertension BP Readings from Last 3 Encounters:  09/04/19 (!) 156/70  09/04/19 (!) 156/70  07/03/19 (!) 150/58   . Pharmacist Clinical Goal(s): o Over the next 90 days, patient will work with PharmD and providers to achieve BP goal <130/80 . Current regimen:   Amlodipine 10mg  daily  Hydralazine 25mg  twice daily  Metoprolol succinate 25mg  daily  Telmisartan/HCTZ 80/25mg  daily . Interventions: o Dietary and exercise recommendations provided o Recommend patient check blood pressure in the morning . Patient self care activities - Over the next 30 days, patient will: o Check BP daily in the morning, document, and provide at future appointments o Ensure daily salt intake < 2300 mg/day o Cut back on bacon consumption o Try to exercise for 30 minutes 5 times weekly  Hyperlipidemia Lab Results  Component Value Date/Time   LDLCALC 182 (H) 09/11/2019 09:02 AM   . Pharmacist Clinical Goal(s): o Over the next 90 days, patient will work with PharmD and providers to achieve LDL goal < 70 . Current regimen:  o Rosuvastatin 10mg  daily . Interventions: o Dietary and exercise recommendations provided . Patient self care activities - Over the next 90 days, patient will: o Take cholesterol medication daily as directed o Exercise 30 minutes daily 5 times per week o Increase healthy fats in diet: avocados, flaxseed, walnuts, etc o Limit trans and saturated fats  Diabetes Lab Results  Component Value Date/Time   HGBA1C 7.7 (H) 09/11/2019 09:02 AM   HGBA1C 8.8 (A) 07/03/2019 11:38 AM   HGBA1C 8.9 (H) 02/13/2019 02:10 PM   . Pharmacist Clinical  Goal(s): o Over the next 90 days, patient will work with PharmD and providers to achieve A1c goal <7% . Current regimen:   Glipizide XL 2.5mg  daily with breakfast  Metformin XR 750mg  daily with breakfast  Ozempic 1mg  weekly  Tresiba 28 units at bedtime . Interventions: o Contacted Dr. Cruzita Lederer (Endocrinologist) and recommended stopping metformin due to reduced GFR (27) - Dr. Arman Filter office will contact patient to let her know whether or not to stop metformin . Patient self care activities - Over the next 90 days, patient will: o Check blood sugar twice daily, document, and provide at future appointments o Contact provider with any episodes of hypoglycemia  Medication management . Pharmacist Clinical Goal(s): o Over the next 90 days, patient will work with PharmD and providers to achieve optimal medication adherence . Current pharmacy: [facility] . Interventions o Comprehensive medication review performed. o Continue current medication management strategy . Patient self care activities - Over the next 90 days, patient will: o Focus on medication adherence by utilizing pill box o Take medications as prescribed o Report any questions or concerns to PharmD and/or provider(s)  Please see past updates related to this goal by clicking on the "Past Updates" button in the selected goal         The patient verbalized understanding of instructions provided today and agreed to receive a mailed copy of patient instruction and/or educational materials.  Telephone follow up appointment with pharmacy team member scheduled for: 12/22/19 @ 3:30 PM  Jannette Fogo, PharmD Clinical  Pharmacist [facility] [phone]   DASH Eating Plan [place] stands for "Dietary Approaches to Stop Hypertension." The DASH eating plan is a healthy eating plan that has been shown to reduce high blood pressure (hypertension). It may also reduce your risk for type 2 diabetes,  heart disease, and stroke. The DASH eating plan may also help with weight loss. What are tips for following this plan?  General guidelines  Avoid eating more than 2,300 mg (milligrams) of salt (sodium) a day. If you have hypertension, you may need to reduce your sodium intake to 1,500 mg a day.  Limit alcohol intake to no more than 1 drink a day for nonpregnant women and 2 drinks a day for men. One drink equals 12 oz of beer, 5 oz of wine, or 1 oz of hard liquor.  Work with your health care provider to maintain a healthy body weight or to lose weight. Ask what an ideal weight is for you.  Get at least 30 minutes of exercise that causes your heart to beat faster (aerobic exercise) most days of the week. Activities may include walking, swimming, or biking.  Work with your health care provider or diet and nutrition specialist (dietitian) to adjust your eating plan to your individual calorie needs. Reading food labels   Check food labels for the amount of sodium per serving. Choose foods with less than 5 percent of the Daily Value of sodium. Generally, foods with less than 300 mg of sodium per serving fit into this eating plan.  To find whole grains, look for the word "whole" as the first word in the ingredient list. Shopping  Buy products labeled as "low-sodium" or "no salt added."  Buy fresh foods. Avoid canned foods and premade or frozen meals. Cooking  Avoid adding salt when cooking. Use salt-free seasonings or herbs instead of table salt or sea salt. Check with your health care provider or pharmacist before using salt substitutes.  Do not fry foods. Cook foods using healthy methods such as baking, boiling, grilling, and broiling instead.  Cook with heart-healthy oils, such as olive, canola, soybean, or sunflower oil. Meal planning  Eat a balanced diet that includes: ? 5 or more servings of fruits and vegetables each day. At each meal, try to fill half of your plate with fruits  and vegetables. ? Up to 6-8 servings of whole grains each day. ? Less than 6 oz of lean meat, poultry, or fish each day. A 3-oz serving of meat is about the same size as a deck of cards. One egg equals 1 oz. ? 2 servings of low-fat dairy each day. ? A serving of nuts, seeds, or beans 5 times each week. ? Heart-healthy fats. Healthy fats called Omega-3 fatty acids are found in foods such as flaxseeds and coldwater fish, like sardines, salmon, and mackerel.  Limit how much you eat of the following: ? Canned or prepackaged foods. ? Food that is high in trans fat, such as fried foods. ? Food that is high in saturated fat, such as fatty meat. ? Sweets, desserts, sugary drinks, and other foods with added sugar. ? Full-fat dairy products.  Do not salt foods before eating.  Try to eat at least 2 vegetarian meals each week.  Eat more home-cooked food and less restaurant, buffet, and fast food.  When eating at a restaurant, ask that your food be prepared with less salt or no salt, if possible. What foods are recommended? The items listed may not  be a complete list. Talk with your dietitian about what dietary choices are best for you. Grains Whole-grain or whole-wheat bread. Whole-grain or whole-wheat pasta. Brown rice. [place]. Bulgur. Whole-grain and low-sodium cereals. Pita bread. Low-fat, low-sodium crackers. Whole-wheat flour tortillas. Vegetables Fresh or frozen vegetables (raw, steamed, roasted, or grilled). Low-sodium or reduced-sodium tomato and vegetable juice. Low-sodium or reduced-sodium tomato sauce and tomato paste. Low-sodium or reduced-sodium canned vegetables. Fruits All fresh, dried, or frozen fruit. Canned fruit in natural juice (without added sugar). Meat and other protein foods Skinless chicken or [place]. Ground chicken or [place]. Pork with fat trimmed off. Fish and seafood. Egg whites. Dried beans, peas, or lentils. Unsalted nuts, nut butters, and seeds. Unsalted  canned beans. Lean cuts of beef with fat trimmed off. Low-sodium, lean deli meat. Dairy Low-fat (1%) or fat-free (skim) milk. Fat-free, low-fat, or reduced-fat cheeses. Nonfat, low-sodium ricotta or cottage cheese. Low-fat or nonfat yogurt. Low-fat, low-sodium cheese. Fats and oils Soft margarine without trans fats. Vegetable oil. Low-fat, reduced-fat, or light mayonnaise and salad dressings (reduced-sodium). Canola, safflower, olive, soybean, and sunflower oils. Avocado. Seasoning and other foods Herbs. Spices. Seasoning mixes without salt. Unsalted popcorn and pretzels. Fat-free sweets. What foods are not recommended? The items listed may not be a complete list. Talk with your dietitian about what dietary choices are best for you. Grains Baked goods made with fat, such as croissants, muffins, or some breads. Dry pasta or rice meal packs. Vegetables Creamed or fried vegetables. Vegetables in a cheese sauce. Regular canned vegetables (not low-sodium or reduced-sodium). Regular canned tomato sauce and paste (not low-sodium or reduced-sodium). Regular tomato and vegetable juice (not low-sodium or reduced-sodium). Angie Fava. Olives. Fruits Canned fruit in a light or heavy syrup. Fried fruit. Fruit in cream or butter sauce. Meat and other protein foods Fatty cuts of meat. Ribs. Fried meat. Berniece Salines. Sausage. Bologna and other processed lunch meats. Salami. Fatback. Hotdogs. Bratwurst. Salted nuts and seeds. Canned beans with added salt. Canned or smoked fish. Whole eggs or egg yolks. Chicken or [place] with skin. Dairy Whole or 2% milk, cream, and half-and-half. Whole or full-fat cream cheese. Whole-fat or sweetened yogurt. Full-fat cheese. Nondairy creamers. Whipped toppings. Processed cheese and cheese spreads. Fats and oils Butter. Stick margarine. Lard. Shortening. Ghee. Bacon fat. Tropical oils, such as coconut, palm kernel, or palm oil. Seasoning and other foods Salted popcorn and pretzels. Onion  salt, garlic salt, seasoned salt, table salt, and sea salt. Worcestershire sauce. Tartar sauce. Barbecue sauce. Teriyaki sauce. Soy sauce, including reduced-sodium. Steak sauce. Canned and packaged gravies. Fish sauce. Oyster sauce. Cocktail sauce. Horseradish that you find on the shelf. Ketchup. Mustard. Meat flavorings and tenderizers. Bouillon cubes. Hot sauce and Tabasco sauce. Premade or packaged marinades. Premade or packaged taco seasonings. Relishes. Regular salad dressings. Where to find more information:  National Heart, Lung, and [place]: https://wilson-eaton.com/  American Heart Association: www.heart.org Summary  The DASH eating plan is a healthy eating plan that has been shown to reduce high blood pressure (hypertension). It may also reduce your risk for type 2 diabetes, heart disease, and stroke.  With the DASH eating plan, you should limit salt (sodium) intake to 2,300 mg a day. If you have hypertension, you may need to reduce your sodium intake to 1,500 mg a day.  When on the DASH eating plan, aim to eat more fresh fruits and vegetables, whole grains, lean proteins, low-fat dairy, and heart-healthy fats.  Work with your health care provider or diet and nutrition  specialist (dietitian) to adjust your eating plan to your individual calorie needs. This information is not intended to replace advice given to you by your health care provider. Make sure you discuss any questions you have with your health care provider. Document Revised: 03/09/2017 Document Reviewed: 03/20/2016 Elsevier Patient Education  2020 [employer].

## 2019-11-13 DIAGNOSIS — E113311 Type 2 diabetes mellitus with moderate nonproliferative diabetic retinopathy with macular edema, right eye: Secondary | ICD-10-CM | POA: Diagnosis not present

## 2019-11-13 DIAGNOSIS — H401130 Primary open-angle glaucoma, bilateral, stage unspecified: Secondary | ICD-10-CM | POA: Diagnosis not present

## 2019-11-13 DIAGNOSIS — H5789 Other specified disorders of eye and adnexa: Secondary | ICD-10-CM | POA: Diagnosis not present

## 2019-11-13 DIAGNOSIS — H5440 Blindness, one eye, unspecified eye: Secondary | ICD-10-CM | POA: Diagnosis not present

## 2019-11-25 ENCOUNTER — Telehealth: Payer: Self-pay

## 2019-11-25 NOTE — Chronic Care Management (AMB) (Addendum)
Chronic Care Management Pharmacy Assistant   Name: [name] [name]  MRN: [id number] DOB: 1940-11-22  Reason for Encounter: Disease State/ Diabetes Adherence Call.   Patient Questions:  1.  Have you seen any other providers since your last visit? Yes, 11/13/2019 Dr Cordelia Pen- Opthalmology  2.  Any changes in your medicines or health? No Called patient, she needed to call back.   PCP : Glendale Chard, MD  Allergies:   Allergies  Allergen Reactions   Brimonidine Other (See Comments)    Eyes swell and itch   Dorzolamide     Other reaction(s): Other (See Comments) Red, inflamed eyelid, dermatitis    Timolol Other (See Comments)    Caused eyes to swell and itch    Medications: Outpatient Encounter Medications as of 11/25/2019  Medication Sig   ACCU-CHEK AVIVA PLUS test strip TEST 3 TIMES DAILY   amLODipine (NORVASC) 10 MG tablet TAKE 1 TABLET BY MOUTH EVERY DAY   aspirin 325 MG EC tablet Take 325 mg by mouth daily.   B-D UF III MINI PEN NEEDLES 31G X 5 MM MISC Use as directed once per day with Victoza and 2 times per day with Levemir   glipiZIDE (GLUCOTROL XL) 2.5 MG 24 hr tablet TAKE 1 TABLET(2.5 MG) BY MOUTH DAILY WITH BREAKFAST   hydrALAZINE (APRESOLINE) 25 MG tablet TAKE 1 TABLET(25 MG) BY MOUTH TWICE DAILY   hydrOXYzine (ATARAX/VISTARIL) 25 MG tablet Take 1 tablet by mouth at bedtime as needed. 1-2 tablets at bedtime prn   Lancets (ACCU-CHEK SOFT TOUCH) lancets Check blood sugars 3 times daily E11.65   latanoprost (XALATAN) 0.005 % ophthalmic solution Place 1 drop into both eyes at bedtime.    metFORMIN (GLUCOPHAGE-XR) 750 MG 24 hr tablet Take 1 tablet (750 mg total) by mouth daily with breakfast. (Patient taking differently: Take 750 mg by mouth daily with breakfast. Takes with dinner)   metoprolol succinate (TOPROL-XL) 25 MG 24 hr tablet Take 25 mg by mouth daily.   Multiple Vitamin (MULTIVITAMIN) tablet Take 1 tablet by mouth daily.   rosuvastatin (CRESTOR) 10 MG tablet  TAKE 1 TABLET BY MOUTH EVERY DAY   Semaglutide, 1 MG/DOSE, (OZEMPIC, 1 MG/DOSE,) 2 MG/1.5ML SOPN Inject 1 mg into the skin once a week.   telmisartan-hydrochlorothiazide (MICARDIS HCT) 80-25 MG tablet Take 1 tablet by mouth daily.   TRESIBA FLEXTOUCH 200 UNIT/ML SOPN Inject 28 Units into the skin at bedtime.   No facility-administered encounter medications on file as of 11/25/2019.    Current Diagnosis: Patient Active Problem List   Diagnosis Date Noted   Hyperlipidemia associated with type 2 diabetes mellitus ([place]) 10/09/2018   Overweight (BMI 25.0-29.9) 10/09/2018   Left ventricular diastolic dysfunction, NYHA class 1 02/06/2013   CVA, old, hemiparesis ([place]) 02/06/2013   HTN (hypertension)    Uncontrolled type 2 diabetes mellitus with circulatory disorder, with long-term current use of insulin ([facility])     Goals Addressed   None    Recent Relevant Labs: Lab Results  Component Value Date/Time   HGBA1C 7.7 (H) 09/11/2019 09:02 AM   HGBA1C 8.8 (A) 07/03/2019 11:38 AM   HGBA1C 8.9 (H) 02/13/2019 02:10 PM   MICROALBUR 150 09/04/2019 02:19 PM   MICROALBUR 323.9 (H) 10/09/2018 09:05 AM    Kidney Function Lab Results  Component Value Date/Time   CREATININE 1.95 (H) 10/14/2019 10:11 AM   CREATININE 1.80 (H) 09/11/2019 09:02 AM   CREATININE 1.95 (H) 10/09/2018 09:05 AM   GFRNONAA 24 (L) 10/14/2019  10:11 AM   GFRNONAA 24 (L) 10/09/2018 09:05 AM   GFRAA 27 (L) 10/14/2019 10:11 AM   GFRAA 27 (L) 10/09/2018 09:05 AM    Current antihyperglycemic regimen:  Glipizide 2.5 mg, Ozempic 1 mg and Tresiba 28 units What recent interventions/DTPs have been made to improve glycemic control: None Patient's metformin was recently discontinued by Dr Cruzita Lederer due to low GFR readings.  Have there been any recent hospitalizations or ED visits since last visit with CPP? No Patient denies hypoglycemic symptoms, including Pale, Sweaty, Shaky, Hungry, Nervous/irritable and Vision changes Patient denies  hyperglycemic symptoms, including blurry vision, excessive thirst, fatigue, polyuria and weakness How often are you checking your blood sugar? once daily What are your blood sugars ranging?  Fasting: Patient did not give exact number but did say they are below 100. Before meals: none After meals: none Bedtime: none During the week, how often does your blood glucose drop below 70? Never Are you checking your feet daily/regularly? Yes  Adherence Review: Is the patient currently on a STATIN medication? Yes- Rosuvastatin 10 mg Is the patient currently on ACE/ARB medication? Yes- Telmisartan- hydrochlorothiazide 80/25 mg  Does the patient have >5 day gap between last estimated fill dates? No   Follow-Up:  Pharmacist Review- Patient is not interested in getting Patient Assistance for Tresiba  or Ozempic, she states she is getting medications from the pharmacy with no problems or cost concerns. Jannette Fogo, CPP notified.  Pattricia Boss, [place] Pharmacist Assistant [phone]

## 2019-11-27 ENCOUNTER — Encounter: Payer: Self-pay | Admitting: Internal Medicine

## 2019-11-27 ENCOUNTER — Other Ambulatory Visit: Payer: Self-pay | Admitting: Internal Medicine

## 2019-11-27 ENCOUNTER — Other Ambulatory Visit: Payer: Self-pay

## 2019-11-27 ENCOUNTER — Ambulatory Visit: Payer: Medicare Other | Admitting: Internal Medicine

## 2019-11-27 VITALS — BP 148/60 | HR 80 | Ht 61.2 in | Wt 149.0 lb

## 2019-11-27 DIAGNOSIS — E1169 Type 2 diabetes mellitus with other specified complication: Secondary | ICD-10-CM

## 2019-11-27 DIAGNOSIS — Z794 Long term (current) use of insulin: Secondary | ICD-10-CM

## 2019-11-27 DIAGNOSIS — E1165 Type 2 diabetes mellitus with hyperglycemia: Secondary | ICD-10-CM | POA: Diagnosis not present

## 2019-11-27 DIAGNOSIS — E1159 Type 2 diabetes mellitus with other circulatory complications: Secondary | ICD-10-CM

## 2019-11-27 DIAGNOSIS — IMO0002 Reserved for concepts with insufficient information to code with codable children: Secondary | ICD-10-CM

## 2019-11-27 DIAGNOSIS — E785 Hyperlipidemia, unspecified: Secondary | ICD-10-CM | POA: Diagnosis not present

## 2019-11-27 LAB — POCT GLYCOSYLATED HEMOGLOBIN (HGB A1C): Hemoglobin A1C: 8.8 % — AB (ref 4.0–5.6)

## 2019-11-27 MED ORDER — ROSUVASTATIN CALCIUM 20 MG PO TABS: 20.0000 mg | ORAL_TABLET | Freq: Every day | ORAL | 3 refills | Status: DC

## 2019-11-27 MED ORDER — GLIPIZIDE ER 2.5 MG PO TB24: ORAL_TABLET | ORAL | 3 refills | Status: DC

## 2019-11-27 NOTE — Progress Notes (Signed)
Patient ID: [name] [name], female   DOB: [birth year], 79 y.o.   MRN: [id number]   This visit occurred during the SARS-CoV-2 public health emergency.  Safety protocols were in place, including screening questions prior to the visit, additional usage of staff PPE, and extensive cleaning of exam room while observing appropriate contact time as indicated for disinfecting solutions.   HPI: [name] [name] is a 79 y.o.-year-old female, returning for follow-up for DM2, dx in the 1980s, insulin-dependent since ~2010, uncontrolled, with complications (dCHF, Cerebrovascular disease - history of CVA 2002, CKD, DR, PN).  Last visit 5 months ago.  Reviewed HbA1c levels: Lab Results  Component Value Date   HGBA1C 7.7 (H) 09/11/2019   HGBA1C 8.8 (A) 07/03/2019   HGBA1C 8.9 (H) 02/13/2019   HGBA1C 9.4 (A) 10/09/2018   HGBA1C 9.6 (A) 06/14/2018   HGBA1C 8.4 (A) 02/26/2018  09/10/2017: HbA1c 10.4%  Pt is on a regimen of: -  >> stopped 10/2019 due to worsening kidney function - Glipizide ER 2.5 mg before breakfast - Tresiba 28 >> 26 >> 20 units at night - Ozempic 0.5 mg weekly-added 06/2018 >> increased to 1 mg weekly 06/2019 In 02/2018: We stopped Onglyza since this was expensive and started glipizide ER low-dose. She was previously also on Levemir 35 units daily.  Pt checks her sugars once a day: - am: 68 x1, 69 x1, 89-120s, 133, 200s w/o insulin >> 59-141, 197-224 if she forgets insulin >> 80-125 - 2h after b'fast: n/c >> 269 >> n/c - before lunch: n/c >> ?, 190 >> n/c - 2h after lunch: n/c >> 199 >> n/c - before dinner: n/c - 2h after dinner: n/c - bedtime: n/c - nighttime: n/c Lowest sugar was 50s in Spring 2019  ... >> 59 >> 80; she has hypoglycemia awareness in the 81s. Highest sugar was  441 (missed insulin) >> 285 >> 224 >> 200.  Glucometer: Accu-Chek [employer]iva  Pt's meals are - eats 2x a day: - Breakfast: oatmeal + bacon - Lunch: veggies and a piece of chicken - small meal - Dinner:  chicken, veggies - Snacks: apples, oranges  -+ CKD, last BUN/creatinine:  Lab Results  Component Value Date   BUN 55 (H) 10/14/2019   BUN 50 (H) 09/11/2019   CREATININE 1.95 (H) 10/14/2019   CREATININE 1.80 (H) 09/11/2019  09/10/2017: 27/1.52, GFR 37 On telmisartan 80.  -+ HL; lipids: Lab Results  Component Value Date   CHOL 270 (H) 09/11/2019   HDL 45 09/11/2019   LDLCALC 182 (H) 09/11/2019   TRIG 227 (H) 09/11/2019   CHOLHDL 6.0 (H) 09/11/2019  08/14/2017: 151/187/44/70 On Crestor 10. She is also off Vascepa.  - last eye exam was in 03/2019: + Moderate NPDR OD, + macular edema.  She was taking IO injections.  Her stroke affected her left eye vision.   -+ Numbness and tingling in her feet  On ASA 325.  Pt has FH of DM in father.  TSH was normal: 06/07/2016: 2.110. No results found for: TSH  ROS: Constitutional: no weight gain/no weight loss, no fatigue, no subjective hyperthermia, no subjective hypothermia Eyes: no blurry vision, no xerophthalmia ENT: no sore throat, no nodules palpated in neck, no dysphagia, no odynophagia, no hoarseness Cardiovascular: no CP/no SOB/no palpitations/no leg swelling Respiratory: no cough/no SOB/no wheezing Gastrointestinal: no N/no V/no D/no C/no acid reflux Musculoskeletal: no muscle aches/no joint aches Skin: no rashes, no hair loss Neurological: no tremors/+ numbness/n+ tingling/no dizziness  I reviewed pt's medications,  allergies, PMH, social hx, family hx, and changes were documented in the history of present illness. Otherwise, unchanged from my initial visit note.  Past Medical History:  Diagnosis Date  . Diabetes mellitus ([place])   . H/O: CVA (cerebrovascular accident) 2002  . HTN (hypertension)   . Hyperlipidemia   . Pulmonary HTN ([place])    mild by echo   Past Surgical History:  Procedure Laterality Date  . ABDOMINAL HYSTERECTOMY    . CARDIAC CATHETERIZATION  03/17/2003   Normal, patent coronary arteries. Normal LV  systolic function  . CARDIOVASCULAR STRESS TEST  08/28/2006   Small sized, moderate ischemiain the Basal Inferoseptal, Basal Inferior, Mid Inferior and Apical Inferior regions  . TRANSTHORACIC ECHOCARDIOGRAM  02/17/2010   EF >55%, moderate-severely thickened septum, mild-moderate mitral annular calcification   Social History   Socioeconomic History  . Marital status: Married    Spouse name: Not on file  . Number of children: Not on file  . Years of education: Not on file  . Highest education level: Not on file  Occupational History  . Occupation: retired  Tobacco Use  . Smoking status: Never Smoker  . Smokeless tobacco: Never Used  Vaping Use  . Vaping Use: Never used  Substance and Sexual Activity  . Alcohol use: No  . Drug use: Never  . Sexual activity: Not Currently  Other Topics Concern  . Not on file  Social History Narrative  . Not on file   Social Determinants of Health   Financial Resource Strain: Low Risk   . Difficulty of Paying Living Expenses: Not hard at all  Food Insecurity: No Food Insecurity  . Worried About Charity fundraiser in the Last Year: Never true  . Ran Out of Food in the Last Year: Never true  Transportation Needs: No Transportation Needs  . Lack of Transportation (Medical): No  . Lack of Transportation (Non-Medical): No  Physical Activity: Inactive  . Days of Exercise per Week: 0 days  . Minutes of Exercise per Session: 0 min  Stress: No Stress Concern Present  . Feeling of Stress : Not at all  Social Connections:   . Frequency of Communication with Friends and Family: Not on file  . Frequency of Social Gatherings with Friends and Family: Not on file  . Attends Religious Services: Not on file  . Active Member of Clubs or Organizations: Not on file  . Attends Archivist Meetings: Not on file  . Marital Status: Not on file  Intimate Partner Violence:   . Fear of Current or Ex-Partner: Not on file  . Emotionally Abused: Not on  file  . Physically Abused: Not on file  . Sexually Abused: Not on file   Current Outpatient Medications on File Prior to Visit  Medication Sig Dispense Refill  . ACCU-CHEK AVIVA PLUS test strip TEST 3 TIMES DAILY 100 each 11  . amLODipine (NORVASC) 10 MG tablet TAKE 1 TABLET BY MOUTH EVERY DAY 90 tablet 1  . aspirin 325 MG EC tablet Take 325 mg by mouth daily.    . B-D UF III MINI PEN NEEDLES 31G X 5 MM MISC Use as directed once per day with Victoza and 2 times per day with Levemir 200 each 1  . glipiZIDE (GLUCOTROL XL) 2.5 MG 24 hr tablet TAKE 1 TABLET(2.5 MG) BY MOUTH DAILY WITH BREAKFAST 90 tablet 3  . hydrALAZINE (APRESOLINE) 25 MG tablet TAKE 1 TABLET(25 MG) BY MOUTH TWICE DAILY 180 tablet 1  .  hydrOXYzine (ATARAX/VISTARIL) 25 MG tablet Take 1 tablet by mouth at bedtime as needed. 1-2 tablets at bedtime prn    . Lancets (ACCU-CHEK SOFT TOUCH) lancets Check blood sugars 3 times daily E11.65 100 each 12  . latanoprost (XALATAN) 0.005 % ophthalmic solution Place 1 drop into both eyes at bedtime.     . metFORMIN (GLUCOPHAGE-XR) 750 MG 24 hr tablet Take 1 tablet (750 mg total) by mouth daily with breakfast. (Patient taking differently: Take 750 mg by mouth daily with breakfast. Takes with dinner) 90 tablet 1  . metoprolol succinate (TOPROL-XL) 25 MG 24 hr tablet Take 25 mg by mouth daily.    . Multiple Vitamin (MULTIVITAMIN) tablet Take 1 tablet by mouth daily.    . rosuvastatin (CRESTOR) 10 MG tablet TAKE 1 TABLET BY MOUTH EVERY DAY 90 tablet 1  . Semaglutide, 1 MG/DOSE, (OZEMPIC, 1 MG/DOSE,) 2 MG/1.5ML SOPN Inject 1 mg into the skin once a week. 6 pen 3  . telmisartan-hydrochlorothiazide (MICARDIS HCT) 80-25 MG tablet Take 1 tablet by mouth daily. 90 tablet 1  . TRESIBA FLEXTOUCH 200 UNIT/ML SOPN Inject 28 Units into the skin at bedtime. 15 pen 5   No current facility-administered medications on file prior to visit.   Allergies  Allergen Reactions  . Brimonidine Other (See Comments)     Eyes swell and itch  . Dorzolamide     Other reaction(s): Other (See Comments) Red, inflamed eyelid, dermatitis   . Timolol Other (See Comments)    Caused eyes to swell and itch   Family History  Problem Relation Age of Onset  . Hypertension Mother   . Hypertension Father   . Heart Problems Father   . Hypertension Sister   . Diabetes Sister   . Hypertension Brother   . Diabetes Brother    PE: BP (!) 148/60   Pulse 80   Ht 5' 1.2" (1.554 m)   Wt 149 lb (67.6 kg)   SpO2 98%   BMI 27.97 kg/m   Wt Readings from Last 3 Encounters:  11/27/19 149 lb (67.6 kg)  10/28/19 143 lb 6.4 oz (65 kg)  10/14/19 145 lb 3.2 oz (65.9 kg)   Constitutional: + Slightly overweight, in NAD Eyes: PERRLA, EOMI, no exophthalmos ENT: moist mucous membranes, no thyromegaly, no cervical lymphadenopathy Cardiovascular: RRR, No MRG Respiratory: CTA B Gastrointestinal: abdomen soft, NT, ND, BS+ Musculoskeletal: no deformities, strength intact in all 4 Skin: moist, warm, no rashes Neurological: no tremor with outstretched hands, DTR normal in all 4  ASSESSMENT: 1. DM2, insulin-dependent, uncontrolled, with long-term complications - dCHF - Cerebrovascular disease, history of CVA 2002 - CKD - DR - PN  2. HL  3.  Overweight  PLAN:  1. Patient with longstanding, uncontrolled, type 2 diabetes, on sulfonylurea, long-acting insulin, and weekly GLP-1 receptor agonist.  Since last visit, Metformin was stopped due to worsening CKD.  She has a history of CHF so ideally she would be on an SGLT2 inhibitor, but her GFR was too low so we could not start it.  We discussed in the past about setting alarms on her phone not to forget to take her medications and also to try to reduce eating out.  She was missing [place] dose occasionally and sugars were in the 200s when she was doing so. -At last visit, HbA1c was stable, at 8.8%, above target.  She was only checking sugars in the morning and I advised her to also  start checking later in the day.  I also suggested to increase the dose of Ozempic.  Since she had a low of 59 one morning, I advised her to let me know if she experienced any more low blood sugars, as in that case, we would have needed to stop glipizide ER or Reduce Tresiba dose.  She did not experience any more lows since last visit. -We reviewed together her latest HbA1c obtained 2 months ago, which  improved to 7.7% from 8.8% -At this visit, she is off Metformin, which was stopped 1 month ago due to worsening kidney function.  Her morning blood sugars are at goal, and she is not checking later in the day.  I strongly advised her to start doing so and given her sugar logs.  However, we checked her HbA1c Today: 8.8% (higher) -For now, will increase her glipizide in the morning to 5 mg -I advised her to send me her sugars in 2 weeks to see if we do not need to add mealtime insulin - I suggested to:  Patient Instructions  Please increase: - Glipizide ER to 5 mg before breakfast  Continue: - Ozempic 1 mg weekly - Tresiba 20 units daily in a.m.  Check sugars later in the day, also.  Please call us with the sugars in 2 weeks.  Increase rosuvastatin to 20 mg daily.  Please come back for a follow-up appointment in 3 months.  - advised to check sugars at different times of the day - 1-2x a day, rotating check times - advised for yearly eye exams >> she is UTD - return to clinic in 3 months  2. HL -Reviewed latest lipid panel from 09/2019: LDL very high, triglycerides high, HDL at goal: Lab Results  Component Value Date   CHOL 270 (H) 09/11/2019   HDL 45 09/11/2019   LDLCALC 182 (H) 09/11/2019   TRIG 227 (H) 09/11/2019   CHOLHDL 6.0 (H) 09/11/2019  -At the return of the above labs, PCP advised her to add atorvastatin 80 mg daily.  She did not start this, however, she brings her medication back with her and she apparently takes rosuvastatin 10 mg.  She is off Vascepa.  I advised her to  increase rosuvastatin to 20 mg daily and to take it consistently. -She also absolutely needs to improve her diet  3.  Overweight -Continue Ozempic which should also help with weight loss -weight net stable since last OV  Philemon Kingdom, MD PhD [facility]

## 2019-11-27 NOTE — Addendum Note (Signed)
Addended by: Cardell Peach I on: 11/27/2019 12:46 PM   Modules accepted: Orders

## 2019-11-27 NOTE — Patient Instructions (Addendum)
Please increase: - Glipizide ER to 5 mg before breakfast  Continue: - Ozempic 1 mg weekly - Tresiba 20 units daily in a.m.  Check sugars later in the day, also.  Please call us with the sugars in 2 weeks.  Increase rosuvastatin to 20 mg daily.  Please come back for a follow-up appointment in 3 months.

## 2019-12-19 NOTE — Chronic Care Management (AMB) (Signed)
Chronic Care Management Pharmacy  Name: ANNAELLE KASEL  MRN: [id number] DOB: 07/21/1940  Chief Complaint/ HPI  [name],  79 y.o. , female presents for their Follow-Up CCM visit with the clinical pharmacist via telephone due to COVID-19 Pandemic.  PCP : Glendale Chard, MD  Their chronic conditions include: Hypertension, Hyperlipidemia, Diabetes  Office Visits: 10/28/19 Nurse visit: BP check. 138/78 today. Improved from last OV. Advised pt to increase activity and water intake.    10/14/19 OV: Presented for BP check. Pt had not taken meds prior to visit b/c she had not eaten. Unable to adjust medications d/t pt not taking medications prior to visit. Suggested home health nurse visit to go over medications and patient declined. BMP8+EGFR ordered. Return in 2 weeks for nurse visit. Pt instructed to take telmisartan/HCTZ in the morning and amlodipine and metoprolol in the evenings.   09/04/19 AWV and OV: Presented for full physical exam. Diabetic foot exam performed. Labs ordered (HgbA1c, CBC, lipid panel, CMP14+EGFR). Telmisartan/HCTZ and metoprolol missing from medication bag. Rx sent in for telmisartan/HCTZ. Both ears flushed by irrigation.   02/13/19 OV: Presented for HTN check and ER follow up. Labs ordered (HgbA1c). Pt encouraged to return in 2 weeks for repeat BMP8+EGFR testing.   Consult Visit: 11/27/19 Endocrinology OV w/ Dr. Cruzita Lederer: HgbA1c up to 8.8%. Advised pt to check blood sugars twice daily. Increase glipizide ER to 31m in the morning. Continue Ozempic 128mweekly and Tresiba 20 units daily in the morning. Increase rosuvastatin to 2051maily. Follow up in 3 months. Call with blood sugars in 2 weeks.   09/18/19 Orthotics fitting for diabetic shoe inserts  09/11/19 Ophthalmology OV w/ Dr. GreCordelia Pen week f/u with OCT scan.Pt reported excessive watering OS, but OD vision improved. Edema present. Avastin OD today. Follow up in 3 months.   07/24/19 Ophthalmology OV w. Dr.  GreCordelia Penresented for follow up for diabetic macular edema of both eyes. Pt has undergone multiple procedures. Primary open angle glaucoma and nuclear cataracts present in both eyes. Placed on timolol drops BID OU in 2015 for elevated IOP, still using today. Pt reports vision is stable. Exam revealed edema and worsening of vision in right eye. Avastin injection administered in right eye. Follow up in 3 months.  07/18/19 Podiatry OV: Pt was measured for medical necessity extra depth shoes and custom foam orthotics. Forms sent to PCP to verify and sign off on medical necessity.   07/03/19 Endocrinology OV w/ Dr. GheCruzita Ledererresented for follow up for DM. Pt reported several low BG readings in the AM. TreTyler Aass decreased at previous appt to 20 units nightly due to BG improving and low BG. Instructed pt to move Tre[place]igua and Barbudase to AM so that she does not forget it. Increased Ozempic to 1mg60mekly. Follow up in 3-4 months.   05/14/19 Podiatry OV w/ Dr. RegaPaulla Dolly reported more tingling and burning in right foot. Recommended diabetic shoes.   03/20/19 Ophthalmology OV w. Dr. GrevCordelia Penabetic macular edema, OD stable. Hold eye injection today due to stability. Follow up in 3 months.   02/12/19 ED visit: Presented for near syncopal episode. Pt reported waking up this morning and became lightheaded after walking in to the kitchen to make coffee. Her husband helped her to the ground and she reported generalized weakness. She denied chest pain, SOB, headache, vision changes, or nausea/vomiting. No acute findings. Pt experienced no symptoms while in ED. Mild hypokalemia present and supplemented. Recheck with PCP in 1 week.  Medications: Outpatient Encounter Medications as of 12/22/2019  Medication Sig  . ACCU-CHEK AVIVA PLUS test strip TEST 3 TIMES DAILY  . amLODipine (NORVASC) 10 MG tablet TAKE 1 TABLET BY MOUTH EVERY DAY  . B-D UF III MINI PEN NEEDLES 31G X 5 MM MISC Use as directed once per day with Victoza and 2 times  per day with Levemir  . glipiZIDE (GLUCOTROL XL) 2.5 MG 24 hr tablet TAKE 5 MG BY MOUTH DAILY before BREAKFAST  . hydrALAZINE (APRESOLINE) 25 MG tablet TAKE 1 TABLET(25 MG) BY MOUTH TWICE DAILY  . Lancets (ACCU-CHEK SOFT TOUCH) lancets Check blood sugars 3 times daily E11.65  . metoprolol succinate (TOPROL-XL) 25 MG 24 hr tablet Take 25 mg by mouth daily.  . rosuvastatin (CRESTOR) 20 MG tablet Take 1 tablet (20 mg total) by mouth daily.  . Semaglutide, 1 MG/DOSE, (OZEMPIC, 1 MG/DOSE,) 2 MG/1.5ML SOPN Inject 1 mg into the skin once a week.  . telmisartan-hydrochlorothiazide (MICARDIS HCT) 80-25 MG tablet TAKE 1 TABLET BY MOUTH EVERY DAY  . TRESIBA FLEXTOUCH 200 UNIT/ML SOPN Inject 28 Units into the skin at bedtime.  Marland Kitchen aspirin 325 MG EC tablet Take 325 mg by mouth daily.  . hydrOXYzine (ATARAX/VISTARIL) 25 MG tablet Take 1 tablet by mouth at bedtime as needed. 1-2 tablets at bedtime prn  . latanoprost (XALATAN) 0.005 % ophthalmic solution Place 1 drop into both eyes at bedtime.   . Multiple Vitamin (MULTIVITAMIN) tablet Take 1 tablet by mouth daily.   No facility-administered encounter medications on file as of 12/22/2019.    Current Diagnosis/Assessment:  SDOH Interventions     Most Recent Value  SDOH Interventions  Financial Strain Interventions Intervention Not Indicated      Goals Addressed            This Visit's Progress   . Pharmacy Care Plan       CARE PLAN ENTRY (see longitudinal plan of care for additional care plan information)  Current Barriers:  . Chronic Disease Management support, education, and care coordination needs related to Hypertension, Hyperlipidemia, and Diabetes   Hypertension BP Readings from Last 3 Encounters:  09/04/19 (!) 156/70  09/04/19 (!) 156/70  07/03/19 (!) 150/58   . Pharmacist Clinical Goal(s): o Over the next 90 days, patient will work with PharmD and providers to achieve BP goal <130/80 . Current regimen:   Amlodipine 72m  daily  Hydralazine 228mtwice daily  Metoprolol succinate 2579maily  Telmisartan/HCTZ 80/58m43mily . Interventions: o Dietary and exercise recommendations provided o Recommend patient check blood pressure in the morning . Patient self care activities - Over the next 30 days, patient will: o Check BP daily in the morning, document, and provide at future appointments o Ensure daily salt intake < 2300 mg/day o Cut back on bacon consumption o Try to exercise for 30 minutes 5 times weekly (150  minutes per week)  Hyperlipidemia Lab Results  Component Value Date/Time   LDLCALC 182 (H) 09/11/2019 09:02 AM   . Pharmacist Clinical Goal(s): o Over the next 90 days, patient will work with PharmD and providers to achieve LDL goal < 70 . Current regimen:  o Rosuvastatin 20mg85mly . Interventions: o Dietary and exercise recommendations provided o Rosuvastatin recently increased to 20mg 17my by endocrinologist o Collaborate with endocrinologist and recommend changing rosuvastatin to atorvastatin due to patient's kidney function . Patient self care activities - Over the next 90 days, patient will: o Take cholesterol medication daily as directed o  Exercise 30 minutes daily 5 times per week (150 minutes per week) o Increase healthy fats in diet: avocados, flaxseed, walnuts, etc o Limit trans and saturated fats  Diabetes Lab Results  Component Value Date/Time   HGBA1C 7.7 (H) 09/11/2019 09:02 AM   HGBA1C 8.8 (A) 07/03/2019 11:38 AM   HGBA1C 8.9 (H) 02/13/2019 02:10 PM   . Pharmacist Clinical Goal(s): o Over the next 90 days, patient will work with PharmD and providers to achieve A1c goal <7% . Current regimen:   Glipizide XL 56m daily with breakfast  Ozempic 168mweekly  Tresiba 28 units at bedtime . Interventions: o Provided dietary and exercise recommendations o Determined glipizide was increased to 70m47maily after metformin was discontinued due to kidney function . Patient  self care activities - Over the next 90 days, patient will: o Check blood sugar twice daily, document, and provide at future appointments o Contact provider with any episodes of hypoglycemia o Exercise 30 minutes daily 5 times per week (150 minutes per week)  Medication management . Pharmacist Clinical Goal(s): o Over the next 90 days, patient will work with PharmD and providers to achieve optimal medication adherence . [facility] o Comprehensive medication review performed. o Continue current medication management strategy . Patient self care activities - Over the next 90 days, patient will: o Focus on medication adherence by utilizing pill box o Take medications as prescribed o Report any questions or concerns to PharmD and/or provider(s)  Please see past updates related to this goal by clicking on the "Past Updates" button in the selected goal         Diabetes   Recent Relevant Labs: Lab Results  Component Value Date/Time   HGBA1C 8.8 (A) 11/27/2019 12:46 PM   HGBA1C 7.7 (H) 09/11/2019 09:02 AM   HGBA1C 8.8 (A) 07/03/2019 11:38 AM   HGBA1C 8.9 (H) 02/13/2019 02:10 PM   MICROALBUR 150 09/04/2019 02:19 PM   MICROALBUR 323.9 (H) 10/09/2018 09:05 AM   Kidney Function Lab Results  Component Value Date/Time   CREATININE 1.95 (H) 10/14/2019 10:11 AM   CREATININE 1.80 (H) 09/11/2019 09:02 AM   CREATININE 1.95 (H) 10/09/2018 09:05 AM   GFRNONAA 24 (L) 10/14/2019 10:11 AM   GFRNONAA 24 (L) 10/09/2018 09:05 AM   GFRAA 27 (L) 10/14/2019 10:11 AM   GFRAA 27 (L) 10/09/2018 09:05 AM    Checking BG: 3x per Day   Recent FBG Readings: Recent pre-meal BG readings:  Recent 2hr PP BG readings:   Recent HS BG readings:  Patient has failed these meds in past: Levemir, metformin Patient is currently uncontrolled on the following medications:   Glipizide XL 70mg46mily with breakfast  Ozempic 1mg 47mkly  Tresiba 20 units at bedtime  Last  diabetic Foot exam: 09/04/19 Last diabetic Eye exam: 05/23/18 Lab Results  Component Value Date/Time   HMDIABEYEEXA Retinopathy (A) 05/23/2018 12:00 AM    We discussed:   Pt could not provide BG during this visit (did not have meter with her)  Pt states BG was 66 this morning . Exercise extensively o Walks for 15-20 minutes most days o Recommend pt get 30 minutes of moderate intensity exercise daily 5 times a week (150 minutes total per week)  Plan Continue current medications  Will assist with patient assistance for Ozempic/Tresiba when pt nears the coverage gap   Hypertension   Office blood pressures are  BP Readings from Last 3 Encounters:  11/27/19 (!) 148/60  10/28/19  138/78  10/14/19 (!) 158/80   Patient has failed these meds in the past: Metoprolol succinate, valsartan, valsartan/HCTZ Patient is currently uncontrolled on the following medications:   Amlodipine 34m daily  Hydralazine 217mtwice daily  Metoprolol succinate 2536maily  Telmisartan/HCTZ 80/62m14mily  Patient checks BP at home daily  Patient home BP readings are ranging: 147/70, 148/78, 154/72  We discussed: . Diet and exercise extensively o Does not use salt  Checks BP in the morning when she first gets up  Home BP readings still elevated  Plan Continue current medications   Hyperlipidemia   Lipid Panel     Component Value Date/Time   CHOL 270 (H) 09/11/2019 0902   TRIG 227 (H) 09/11/2019 0902   HDL 45 09/11/2019 0902   LDLCALC 182 (H) 09/11/2019 0902     The ASCVD Risk score (Goff DC Jr., et al., 2013) failed to calculate for the following reasons:   The 2013 ASCVD risk score is only valid for ages 40 t6479  46atient has failed these meds in past: N/A Patient is currently uncontrolled on the following medications:   Rosuvastatin 20mg73mly (increased by Dr. GhergCruzita Ledereran Continue current medications  Will discuss changing rosuvastatin to atorvastatin with Endocrinologist  due to kidney function  Vaccines   Reviewed and discussed patient's vaccination history.    Immunization History  Administered Date(s) Administered  . Fluad Quad(high Dose 65+) 12/12/2018  . Influenza, High Dose Seasonal PF 01/08/2018, 01/02/2019  . Influenza-Unspecified 04/10/2017  . Moderna SARS-COVID-2 Vaccination 05/22/2019, 06/20/2019  . Pneumococcal Polysaccharide-23 03/31/2014  . Pneumococcal-Unspecified 03/31/2014  . Zoster 12/31/2007  Prevnar13 01/07/14 per [facility] [place] Buttsdiscussed:  Pt has not received Shingrix due to pharmacy being busy with COVID  Pt will check on getting Shingrix at the pharmacy now  Plan Recommend Shingrix vaccine at local pharmacy  Medication Management   [facility] uses [facility] medications Uses pill box? Yes Pt endorses 100% compliance  We discussed:   Importance of taking medications daily as directed  Denies missing doses of medications  Plan Continue current medication management strategy   Follow up: 3 month phone visit  CourtJannette FogormD Clinical Pharmacist [facility] [phone]

## 2019-12-22 ENCOUNTER — Other Ambulatory Visit: Payer: Self-pay

## 2019-12-22 ENCOUNTER — Ambulatory Visit: Payer: Self-pay

## 2019-12-22 DIAGNOSIS — E785 Hyperlipidemia, unspecified: Secondary | ICD-10-CM

## 2019-12-22 DIAGNOSIS — E1165 Type 2 diabetes mellitus with hyperglycemia: Secondary | ICD-10-CM

## 2019-12-22 DIAGNOSIS — IMO0002 Reserved for concepts with insufficient information to code with codable children: Secondary | ICD-10-CM

## 2019-12-22 DIAGNOSIS — E1169 Type 2 diabetes mellitus with other specified complication: Secondary | ICD-10-CM

## 2019-12-22 DIAGNOSIS — I129 Hypertensive chronic kidney disease with stage 1 through stage 4 chronic kidney disease, or unspecified chronic kidney disease: Secondary | ICD-10-CM

## 2020-01-01 DIAGNOSIS — L298 Other pruritus: Secondary | ICD-10-CM | POA: Diagnosis not present

## 2020-01-01 DIAGNOSIS — L718 Other rosacea: Secondary | ICD-10-CM | POA: Diagnosis not present

## 2020-01-08 ENCOUNTER — Telehealth: Payer: Self-pay

## 2020-01-08 ENCOUNTER — Other Ambulatory Visit: Payer: Self-pay | Admitting: Internal Medicine

## 2020-01-08 MED ORDER — ATORVASTATIN CALCIUM 40 MG PO TABS: 40.0000 mg | ORAL_TABLET | Freq: Every day | ORAL | 3 refills | Status: DC

## 2020-01-08 NOTE — Telephone Encounter (Signed)
-----   Message from Philemon Kingdom, MD sent at 01/08/2020 12:26 PM EDT ----- Regarding: RE: Patient recommendation from [place], Per the pharmacist message below, we need to change her from Crestor 20 to atorvastatin 40.  I added this to her medication list but did not send it until you can talk to her.  It is on no print.  Can you please send it if she agrees? Ty, C ----- Message ----- From: Cyril Mourning, [facility] Sent: 01/08/2020  10:14 AM EDT To: Philemon Kingdom, MD Subject: Patient recommendation from [place] morning, Dr. Cruzita Lederer!   I am the Chronic Care Management Pharmacist at [facility] and had a phone call with Mrs. Pabst recently. During chart review I saw that her rosuvastatin was increased to 20mg  daily. With rosuvastatin, the manufacturer recommends limiting the dose to 5-10mg  daily if CrCl is less than 30. I would like to recommend considering changing this patient to atorvastatin 40mg  daily because it does not have those same dosing restrictions in regards to renal function. Would you consider making this change or would you prefer to continue with the rosuvastatin 20mg  daily? Please let me know if you have any questions. Thank you.   Jannette Fogo, PharmD Clinical Pharmacist [facility] [phone]

## 2020-01-08 NOTE — Patient Instructions (Addendum)
Visit Information  Goals Addressed            This Visit's Progress   . Pharmacy Care Plan       CARE PLAN ENTRY (see longitudinal plan of care for additional care plan information)  Current Barriers:  . Chronic Disease Management support, education, and care coordination needs related to Hypertension, Hyperlipidemia, and Diabetes   Hypertension BP Readings from Last 3 Encounters:  09/04/19 (!) 156/70  09/04/19 (!) 156/70  07/03/19 (!) 150/58   . Pharmacist Clinical Goal(s): o Over the next 90 days, patient will work with PharmD and providers to achieve BP goal <130/80 . Current regimen:   Amlodipine 10mg  daily  Hydralazine 25mg  twice daily  Metoprolol succinate 25mg  daily  Telmisartan/HCTZ 80/25mg  daily . Interventions: o Dietary and exercise recommendations provided o Recommend patient check blood pressure in the morning . Patient self care activities - Over the next 30 days, patient will: o Check BP daily in the morning, document, and provide at future appointments o Ensure daily salt intake < 2300 mg/day o Cut back on bacon consumption o Try to exercise for 30 minutes 5 times weekly (150  minutes per week)  Hyperlipidemia Lab Results  Component Value Date/Time   LDLCALC 182 (H) 09/11/2019 09:02 AM   . Pharmacist Clinical Goal(s): o Over the next 90 days, patient will work with PharmD and providers to achieve LDL goal < 70 . Current regimen:  o Rosuvastatin 20mg  daily . Interventions: o Dietary and exercise recommendations provided o Rosuvastatin recently increased to 20mg  daily by endocrinologist o Collaborate with endocrinologist and recommend changing rosuvastatin to atorvastatin due to patient's kidney function . Patient self care activities - Over the next 90 days, patient will: o Take cholesterol medication daily as directed o Exercise 30 minutes daily 5 times per week (150 minutes per week) o Increase healthy fats in diet: avocados, flaxseed,  walnuts, etc o Limit trans and saturated fats  Diabetes Lab Results  Component Value Date/Time   HGBA1C 7.7 (H) 09/11/2019 09:02 AM   HGBA1C 8.8 (A) 07/03/2019 11:38 AM   HGBA1C 8.9 (H) 02/13/2019 02:10 PM   . Pharmacist Clinical Goal(s): o Over the next 90 days, patient will work with PharmD and providers to achieve A1c goal <7% . Current regimen:   Glipizide XL 5mg  daily with breakfast  Ozempic 1mg  weekly  Tresiba 28 units at bedtime . Interventions: o Provided dietary and exercise recommendations o Determined glipizide was increased to 5mg  daily after metformin was discontinued due to kidney function . Patient self care activities - Over the next 90 days, patient will: o Check blood sugar twice daily, document, and provide at future appointments o Contact provider with any episodes of hypoglycemia o Exercise 30 minutes daily 5 times per week (150 minutes per week)  Medication management . Pharmacist Clinical Goal(s): o Over the next 90 days, patient will work with PharmD and providers to achieve optimal medication adherence . Current pharmacy: [facility] . Interventions o Comprehensive medication review performed. o Continue current medication management strategy . Patient self care activities - Over the next 90 days, patient will: o Focus on medication adherence by utilizing pill box o Take medications as prescribed o Report any questions or concerns to PharmD and/or provider(s)  Please see past updates related to this goal by clicking on the "Past Updates" button in the selected goal         The patient verbalized understanding of instructions provided today and agreed to  receive a mailed copy of patient instruction and/or educational materials.  Telephone follow up appointment with pharmacy team member scheduled for: 03/22/20 @ 4:15 PM  Jannette Fogo, PharmD Clinical Pharmacist [facility] [phone]   Diabetes Mellitus  and Exercise Exercising regularly is important for your overall health, especially when you have diabetes (diabetes mellitus). Exercising is not only about losing weight. It has many other health benefits, such as increasing muscle strength and bone density and reducing body fat and stress. This leads to improved fitness, flexibility, and endurance, all of which result in better overall health. Exercise has additional benefits for people with diabetes, including:  Reducing appetite.  Helping to lower and control blood glucose.  Lowering blood pressure.  Helping to control amounts of fatty substances (lipids) in the blood, such as cholesterol and triglycerides.  Helping the body to respond better to insulin (improving insulin sensitivity).  Reducing how much insulin the body needs.  Decreasing the risk for heart disease by: ? Lowering cholesterol and triglyceride levels. ? Increasing the levels of good cholesterol. ? Lowering blood glucose levels. What is my activity plan? Your health care provider or certified diabetes educator can help you make a plan for the type and frequency of exercise (activity plan) that works for you. Make sure that you:  Do at least 150 minutes of moderate-intensity or vigorous-intensity exercise each week. This could be brisk walking, biking, or water aerobics. ? Do stretching and strength exercises, such as yoga or weightlifting, at least 2 times a week. ? Spread out your activity over at least 3 days of the week.  Get some form of physical activity every day. ? Do not go more than 2 days in a row without some kind of physical activity. ? Avoid being inactive for more than 30 minutes at a time. Take frequent breaks to walk or stretch.  Choose a type of exercise or activity that you enjoy, and set realistic goals.  Start slowly, and gradually increase the intensity of your exercise over time. What do I need to know about managing my diabetes?   Check  your blood glucose before and after exercising. ? If your blood glucose is 240 mg/dL (13.3 mmol/L) or higher before you exercise, check your urine for ketones. If you have ketones in your urine, do not exercise until your blood glucose returns to normal. ? If your blood glucose is 100 mg/dL (5.6 mmol/L) or lower, eat a snack containing 15-20 grams of carbohydrate. Check your blood glucose 15 minutes after the snack to make sure that your level is above 100 mg/dL (5.6 mmol/L) before you start your exercise.  Know the symptoms of low blood glucose (hypoglycemia) and how to treat it. Your risk for hypoglycemia increases during and after exercise. Common symptoms of hypoglycemia can include: ? Hunger. ? Anxiety. ? Sweating and feeling clammy. ? Confusion. ? Dizziness or feeling light-headed. ? Increased heart rate or palpitations. ? Blurry vision. ? Tingling or numbness around the mouth, lips, or tongue. ? Tremors or shakes. ? Irritability.  Keep a rapid-acting carbohydrate snack available before, during, and after exercise to help prevent or treat hypoglycemia.  Avoid injecting insulin into areas of the body that are going to be exercised. For example, avoid injecting insulin into: ? The arms, when playing tennis. ? The legs, when jogging.  Keep records of your exercise habits. Doing this can help you and your health care provider adjust your diabetes management plan as needed. Write down: ?  Food that you eat before and after you exercise. ? Blood glucose levels before and after you exercise. ? The type and amount of exercise you have done. ? When your insulin is expected to peak, if you use insulin. Avoid exercising at times when your insulin is peaking.  When you start a new exercise or activity, work with your health care provider to make sure the activity is safe for you, and to adjust your insulin, medicines, or food intake as needed.  Drink plenty of water while you exercise to  prevent dehydration or heat stroke. Drink enough fluid to keep your urine clear or pale yellow. Summary  Exercising regularly is important for your overall health, especially when you have diabetes (diabetes mellitus).  Exercising has many health benefits, such as increasing muscle strength and bone density and reducing body fat and stress.  Your health care provider or certified diabetes educator can help you make a plan for the type and frequency of exercise (activity plan) that works for you.  When you start a new exercise or activity, work with your health care provider to make sure the activity is safe for you, and to adjust your insulin, medicines, or food intake as needed. This information is not intended to replace advice given to you by your health care provider. Make sure you discuss any questions you have with your health care provider. Document Revised: 10/19/2016 Document Reviewed: 09/06/2015 Elsevier Patient Education  [place].

## 2020-01-12 ENCOUNTER — Telehealth: Payer: Self-pay

## 2020-01-12 NOTE — Telephone Encounter (Signed)
I returned the pt's daughter's call sonya and left a message that Dr. Baird Cancer wanted to know why the pt needed that it's ok for the pt to take a booster in shot in writing.

## 2020-01-14 ENCOUNTER — Other Ambulatory Visit: Payer: Self-pay

## 2020-01-14 NOTE — Telephone Encounter (Signed)
I left the pt's daughter [name] a message that I was calling to see what pharmacy the pt is going to get her shingles vaccination because Dr. Baird Cancer can fax the prescription to the pharmacy and the pt wouldn't need a letter.

## 2020-01-15 DIAGNOSIS — E113311 Type 2 diabetes mellitus with moderate nonproliferative diabetic retinopathy with macular edema, right eye: Secondary | ICD-10-CM | POA: Diagnosis not present

## 2020-01-15 DIAGNOSIS — Z7984 Long term (current) use of oral hypoglycemic drugs: Secondary | ICD-10-CM | POA: Diagnosis not present

## 2020-01-26 ENCOUNTER — Other Ambulatory Visit: Payer: Self-pay | Admitting: Internal Medicine

## 2020-01-28 ENCOUNTER — Ambulatory Visit: Payer: Medicare Other | Admitting: Internal Medicine

## 2020-02-09 ENCOUNTER — Ambulatory Visit: Payer: Medicare Other | Admitting: Internal Medicine

## 2020-02-20 ENCOUNTER — Telehealth: Payer: Self-pay

## 2020-02-20 NOTE — Chronic Care Management (AMB) (Signed)
Chronic Care Management Pharmacy Assistant   Name: AVNI TRAORE  MRN: [id number] DOB: 11/05/1940  Reason for Encounter: Disease State - Diabetes, Hypertension and Lipid Adherence Call.  Patient Questions:  1.  Have you seen any other providers since your last visit? Yes. 01/15/2020 [name],  Ophthalmology   2.  Any changes in your medicines or health? No     PCP : Glendale Chard, MD  Allergies:   Allergies  Allergen Reactions   Brimonidine Other (See Comments)    Eyes swell and itch   Dorzolamide     Other reaction(s): Other (See Comments) Red, inflamed eyelid, dermatitis    Timolol Other (See Comments)    Caused eyes to swell and itch    Medications: Outpatient Encounter Medications as of 02/20/2020  Medication Sig   ACCU-CHEK AVIVA PLUS test strip TEST 3 TIMES DAILY   amLODipine (NORVASC) 10 MG tablet TAKE 1 TABLET BY MOUTH EVERY DAY   aspirin 325 MG EC tablet Take 325 mg by mouth daily.   atorvastatin (LIPITOR) 40 MG tablet Take 1 tablet (40 mg total) by mouth daily.   B-D UF III MINI PEN NEEDLES 31G X 5 MM MISC Use as directed once per day with Victoza and 2 times per day with Levemir   glipiZIDE (GLUCOTROL XL) 2.5 MG 24 hr tablet TAKE 5 MG BY MOUTH DAILY before BREAKFAST   hydrALAZINE (APRESOLINE) 25 MG tablet TAKE 1 TABLET(25 MG) BY MOUTH TWICE DAILY   hydrOXYzine (ATARAX/VISTARIL) 25 MG tablet Take 1 tablet by mouth at bedtime as needed. 1-2 tablets at bedtime prn   Lancets (ACCU-CHEK SOFT TOUCH) lancets Check blood sugars 3 times daily E11.65   latanoprost (XALATAN) 0.005 % ophthalmic solution Place 1 drop into both eyes at bedtime.    metoprolol succinate (TOPROL-XL) 25 MG 24 hr tablet Take 25 mg by mouth daily.   Multiple Vitamin (MULTIVITAMIN) tablet Take 1 tablet by mouth daily.   Semaglutide, 1 MG/DOSE, (OZEMPIC, 1 MG/DOSE,) 2 MG/1.5ML SOPN Inject 1 mg into the skin once a week.   telmisartan-hydrochlorothiazide (MICARDIS  HCT) 80-25 MG tablet TAKE 1 TABLET BY MOUTH EVERY DAY   TRESIBA FLEXTOUCH 200 UNIT/ML SOPN Inject 28 Units into the skin at bedtime.   No facility-administered encounter medications on file as of 02/20/2020.    Current Diagnosis: Patient Active Problem List   Diagnosis Date Noted   Hyperlipidemia associated with type 2 diabetes mellitus ([place]) 10/09/2018   Overweight (BMI 25.0-29.9) 10/09/2018   Left ventricular diastolic dysfunction, NYHA class 1 02/06/2013   CVA, old, hemiparesis ([place]) 02/06/2013   HTN (hypertension)    Uncontrolled type 2 diabetes mellitus with circulatory disorder, with long-term current use of insulin ([facility])     Recent Relevant Labs: Lab Results  Component Value Date/Time   HGBA1C 8.8 (A) 11/27/2019 12:46 PM   HGBA1C 7.7 (H) 09/11/2019 09:02 AM   HGBA1C 8.8 (A) 07/03/2019 11:38 AM   HGBA1C 8.9 (H) 02/13/2019 02:10 PM   MICROALBUR 150 09/04/2019 02:19 PM   MICROALBUR 323.9 (H) 10/09/2018 09:05 AM    Kidney Function Lab Results  Component Value Date/Time   CREATININE 1.95 (H) 10/14/2019 10:11 AM   CREATININE 1.80 (H) 09/11/2019 09:02 AM   CREATININE 1.95 (H) 10/09/2018 09:05 AM   GFRNONAA 24 (L) 10/14/2019 10:11 AM   GFRNONAA 24 (L) 10/09/2018 09:05 AM   GFRAA 27 (L) 10/14/2019 10:11 AM   GFRAA 27 (L) 10/09/2018 09:05 AM     Current  antihyperglycemic regimen:  o Glipizide XL  5 mg daily with breakfast o Ozempic 1 mg weekly o Tresiba 28 units at bedtime.  What recent interventions/DTPs have been made to improve glycemic control: o Patient states she is taking her medications as directed by provider.   Have there been any recent hospitalizations or ED visits since last visit with CPP? No    Patient denies hypoglycemic symptoms, including Pale, Sweaty, Shaky, Hungry, Nervous/irritable and Vision changes    Patient denies hyperglycemic symptoms, including blurry vision, excessive thirst, fatigue, polyuria and weakness    How often are you  checking your blood sugar? twice daily    What are your blood sugars ranging? Patient did not have her diary available. o Fasting: 02/20/2020 - 122 o Before meals: None o After meals: None o Bedtime: None   During the week, how often does your blood glucose drop below 70? Never  Are you checking your feet daily/regularly? Yes, Denies any open sores, numbness, tingling or any pain.  Adherence Review: Is the patient currently on a STATIN medication?  Atorvastatin 40  mg daily  Is the patient currently on ACE/ARB medication ? Yes  Does the patient have >5 day gap between last estimated fill dates? Reviewed chart and adherence measures . Per [employer] data medication adherence for cholesterol 100% med compliance/ Medication adherence for hypertension  100% med compliance.  Reviewed chart prior to disease state call. Spoke with patient regarding BP  Recent Office Vitals: BP Readings from Last 3 Encounters:  11/27/19 (!) 148/60  10/28/19 138/78  10/14/19 (!) 158/80   Pulse Readings from Last 3 Encounters:  11/27/19 80  10/28/19 70  10/14/19 74    Wt Readings from Last 3 Encounters:  11/27/19 149 lb (67.6 kg)  10/28/19 143 lb 6.4 oz (65 kg)  10/14/19 145 lb 3.2 oz (65.9 kg)     Kidney Function Lab Results  Component Value Date/Time   CREATININE 1.95 (H) 10/14/2019 10:11 AM   CREATININE 1.80 (H) 09/11/2019 09:02 AM   CREATININE 1.95 (H) 10/09/2018 09:05 AM   GFRNONAA 24 (L) 10/14/2019 10:11 AM   GFRNONAA 24 (L) 10/09/2018 09:05 AM   GFRAA 27 (L) 10/14/2019 10:11 AM   GFRAA 27 (L) 10/09/2018 09:05 AM    BMP Latest Ref Rng & Units 10/14/2019 09/11/2019 02/27/2019  Glucose 65 - 99 mg/dL 238(H) 184(H) 111(H)  BUN 8 - 27 mg/dL 55(H) 50(H) 28(H)  Creatinine 0.57 - 1.00 mg/dL 1.95(H) 1.80(H) 1.73(H)  BUN/Creat Ratio 12 - 28 28 28 16   Sodium 134 - 144 mmol/L 140 141 142  Potassium 3.5 - 5.2 mmol/L 4.2 4.2 4.5  Chloride 96 - 106 mmol/L 104 103 103  CO2 20 - 29 mmol/L 21 22 24     Calcium 8.7 - 10.3 mg/dL 9.5 9.7 9.9     Current antihypertensive regimen:  o Amlodipine 10 mg daily o Hydralazine 25 mg twice a day o Metoprolol Succinate 25 mg daily o Telmisartan/HCTZ 80/25 daily   How often are you checking your Blood Pressure? Patient states she is checking blood pressure once a day.   Current home BP readings: 130/72 -02/20/2020 / 128/70- 02/19/2020   What recent interventions/DTPs have been made by any provider to improve Blood Pressure control since last CPP Visit: Patient states she has been taking her medications as directed.   Any recent hospitalizations or ED visits since last visit with CPP? No   What diet changes have been made to improve Blood Pressure Control?  o Patient states she has been eating healthy , baked foods /no fried foods/ low salt.  What exercise is being done to improve your Blood Pressure Control?  o Patient states she walks 1/2 mile a day.  Adherence Review: Is the patient currently on ACE/ARB medication? Yes Does the patient have >5 day gap between last estimated fill dates? Reviewed chart and adherence measures . Per [employer] data medication adherence for cholesterol 100% med compliance/ Medication adherence for hypertension  100% med compliance.  Comprehensive medication review performed; Spoke to patient regarding cholesterol  Lipid Panel    Component Value Date/Time   CHOL 270 (H) 09/11/2019 0902   TRIG 227 (H) 09/11/2019 0902   HDL 45 09/11/2019 0902   LDLCALC 182 (H) 09/11/2019 0902    10-year ASCVD risk score: The ASCVD Risk score Mikey Bussing DC Jr., et al., 2013) failed to calculate for the following reasons:   The 2013 ASCVD risk score is only valid for ages 34 to 42   Current antihyperlipidemic regimen:  o Atorvastatin 40 mg daily  Previous antihyperlipidemic medications tried: Rosuvastatin   ASCVD risk enhancing conditions: age >83, DM and HTN    What recent interventions/DTPs have been made by any  provider to improve Cholesterol control since last CPP Visit: Patient states she takes her medications as directed.   Any recent hospitalizations or ED visits since last visit with CPP? NO   What diet changes have been made to improve Cholesterol?               Patient states she has been eating healthy , baked foods               no fried foods/ low salt  What exercise is being done to improve Cholesterol?               Patient states she walks 1/2 mile a day.  Adherence Review: Does the patient have >5 day gap between last estimated fill dates? Reviewed chart and adherence measures . Per [employer] data medication adherence for cholesterol 100% med compliance/ Medication adherence for hypertension  100% med compliance.      .   Goals Addressed            This [place]   On track    CARE PLAN ENTRY (see longitudinal plan of care for additional care plan information)  Current Barriers:   Chronic Disease Management support, education, and care coordination needs related to Hypertension, Hyperlipidemia, and Diabetes   Hypertension BP Readings from Last 3 Encounters:  09/04/19 (!) 156/70  09/04/19 (!) 156/70  07/03/19 (!) 150/58    Pharmacist Clinical Goal(s): o Over the next 90 days, patient will work with PharmD and providers to achieve BP goal <130/80  Current regimen:   Amlodipine 10mg  daily  Hydralazine 25mg  twice daily  Metoprolol succinate 25mg  daily  Telmisartan/HCTZ 80/25mg  daily  Interventions: o Dietary and exercise recommendations provided o Recommend patient check blood pressure in the morning  Patient self care activities - Over the next 30 days, patient will: o Check BP daily in the morning, document, and provide at future appointments o Ensure daily salt intake < 2300 mg/day o Cut back on bacon consumption o Try to exercise for 30 minutes 5 times weekly (150  minutes per week)  Hyperlipidemia Lab Results    Component Value Date/Time   LDLCALC 182 (H) 09/11/2019 09:02 AM    Pharmacist Clinical Goal(s): o Over  the next 90 days, patient will work with PharmD and providers to achieve LDL goal < 70  Current regimen:  o Rosuvastatin 20mg  daily  Interventions: o Dietary and exercise recommendations provided o Rosuvastatin recently increased to 20mg  daily by endocrinologist o Collaborate with endocrinologist and recommend changing rosuvastatin to atorvastatin due to patient's kidney function  Patient self care activities - Over the next 90 days, patient will: o Take cholesterol medication daily as directed o Exercise 30 minutes daily 5 times per week (150 minutes per week) o Increase healthy fats in diet: avocados, flaxseed, walnuts, etc o Limit trans and saturated fats  Diabetes Lab Results  Component Value Date/Time   HGBA1C 7.7 (H) 09/11/2019 09:02 AM   HGBA1C 8.8 (A) 07/03/2019 11:38 AM   HGBA1C 8.9 (H) 02/13/2019 02:10 PM    Pharmacist Clinical Goal(s): o Over the next 90 days, patient will work with PharmD and providers to achieve A1c goal <7%  Current regimen:   Glipizide XL 5mg  daily with breakfast  Ozempic 1mg  weekly  Tresiba 28 units at bedtime  Interventions: o Provided dietary and exercise recommendations o Determined glipizide was increased to 5mg  daily after metformin was discontinued due to kidney function  Patient self care activities - Over the next 90 days, patient will: o Check blood sugar twice daily, document, and provide at future appointments o Contact provider with any episodes of hypoglycemia o Exercise 30 minutes daily 5 times per week (150 minutes per week)  Medication management  Pharmacist Clinical Goal(s): o Over the next 90 days, patient will work with PharmD and providers to achieve optimal medication adherence  Current pharmacy: [facility]  Interventions o Comprehensive medication review performed. o Continue current medication  management strategy  Patient self care activities - Over the next 90 days, patient will: o Focus on medication adherence by utilizing pill box o Take medications as prescribed o Report any questions or concerns to PharmD and/or provider(s)  Please see past updates related to this goal by clicking on the "Past Updates" button in the selected goal         Follow-Up:  Pharmacist Review    Vallie J.Pearson, CPP. Notified  Judithann Sheen, [place] Pharmacist Assistant [phone]

## 2020-02-23 ENCOUNTER — Telehealth: Payer: Self-pay | Admitting: *Deleted

## 2020-02-23 NOTE — Chronic Care Management (AMB) (Signed)
°  Care Management   Note  02/23/2020 Name: [name] MRN: [id number] DOB: 04/12/1940  [name] [name] is a 79 y.o. year old female who is a primary care patient of Glendale Chard, MD and is actively engaged with the care management team. I reached out to [name] by phone today to assist with re-scheduling a follow up visit with the Pharmacist  Follow up plan: Unsuccessful telephone outreach attempt made. A HIPAA compliant phone message was left for the patient providing contact information and requesting a return call. The care management team will reach out to the patient again over the next 7 days. If patient returns call to provider office, please advise to call [place] at [facility]  Direct Dial: [phone]

## 2020-03-01 NOTE — Chronic Care Management (AMB) (Signed)
  Care Management   Note  03/01/2020 Name: [name] MRN: [id number] DOB: 02/24/1941  [name] [name] is a 79 y.o. year old female who is a primary care patient of Glendale Chard, MD and is actively engaged with the care management team. I reached out to [name] by phone today to assist with re-scheduling a follow up visit with the Pharmacist  Follow up plan: Unsuccessful telephone outreach attempt made. A HIPAA compliant phone message was left for the patient providing contact information and requesting a return call. The care management team will reach out to the patient again over the next 7 days. If patient returns call to provider office, please advise to call [place] at [phone].  [facility]

## 2020-03-09 NOTE — Chronic Care Management (AMB) (Signed)
  Care Management   Note  03/09/2020 Name: [name] [name] MRN: [id number] DOB: 04-12-1940  [name] [name] is a 79 y.o. year old female who is a primary care patient of Glendale Chard, MD and is actively engaged with the care management team. I reached out to [name] by phone today to assist with re-scheduling a follow up visit with the Pharmacist.  Follow up plan: Telephone appointment with care management team member scheduled for:  [facility]  Direct Dial [phone]

## 2020-03-11 DIAGNOSIS — E113311 Type 2 diabetes mellitus with moderate nonproliferative diabetic retinopathy with macular edema, right eye: Secondary | ICD-10-CM | POA: Diagnosis not present

## 2020-03-11 DIAGNOSIS — H401133 Primary open-angle glaucoma, bilateral, severe stage: Secondary | ICD-10-CM | POA: Diagnosis not present

## 2020-03-11 DIAGNOSIS — H5440 Blindness, one eye, unspecified eye: Secondary | ICD-10-CM | POA: Diagnosis not present

## 2020-03-15 ENCOUNTER — Encounter: Payer: Self-pay | Admitting: Internal Medicine

## 2020-03-15 ENCOUNTER — Ambulatory Visit (INDEPENDENT_AMBULATORY_CARE_PROVIDER_SITE_OTHER): Payer: Medicare Other | Admitting: Internal Medicine

## 2020-03-15 ENCOUNTER — Other Ambulatory Visit: Payer: Self-pay

## 2020-03-15 VITALS — BP 140/72 | HR 71 | Temp 98.1°F | Ht 59.8 in | Wt 151.2 lb

## 2020-03-15 DIAGNOSIS — R202 Paresthesia of skin: Secondary | ICD-10-CM | POA: Diagnosis not present

## 2020-03-15 DIAGNOSIS — E7800 Pure hypercholesterolemia, unspecified: Secondary | ICD-10-CM | POA: Diagnosis not present

## 2020-03-15 DIAGNOSIS — N182 Chronic kidney disease, stage 2 (mild): Secondary | ICD-10-CM | POA: Diagnosis not present

## 2020-03-15 DIAGNOSIS — E1159 Type 2 diabetes mellitus with other circulatory complications: Secondary | ICD-10-CM

## 2020-03-15 DIAGNOSIS — I129 Hypertensive chronic kidney disease with stage 1 through stage 4 chronic kidney disease, or unspecified chronic kidney disease: Secondary | ICD-10-CM | POA: Diagnosis not present

## 2020-03-15 DIAGNOSIS — IMO0002 Reserved for concepts with insufficient information to code with codable children: Secondary | ICD-10-CM

## 2020-03-15 DIAGNOSIS — Z79899 Other long term (current) drug therapy: Secondary | ICD-10-CM

## 2020-03-15 DIAGNOSIS — E663 Overweight: Secondary | ICD-10-CM

## 2020-03-15 DIAGNOSIS — Z794 Long term (current) use of insulin: Secondary | ICD-10-CM

## 2020-03-15 DIAGNOSIS — E1165 Type 2 diabetes mellitus with hyperglycemia: Secondary | ICD-10-CM | POA: Diagnosis not present

## 2020-03-15 MED ORDER — ROSUVASTATIN CALCIUM 20 MG PO TABS
20.0000 mg | ORAL_TABLET | Freq: Every day | ORAL | 3 refills | Status: DC
Start: 2020-03-15 — End: 2020-04-28

## 2020-03-15 NOTE — Progress Notes (Signed)
This visit occurred during the SARS-CoV-2 public health emergency.  Safety protocols were in place, including screening questions prior to the visit, additional usage of staff PPE, and extensive cleaning of exam room while observing appropriate contact time as indicated for disinfecting solutions.  Subjective:     Patient ID: [name] [name] , female    DOB: [birth year] , 79 y.o.   MRN: [id number]   Chief Complaint  Patient presents with  . Diabetes  . Hypertension    HPI  She presents today for BP check. She states that she is compliant with medications. She has no concerns at this time. She is scheduled to see Dr. Cruzita Lederer later this month.   Diabetes She presents for her follow-up diabetic visit. She has type 2 diabetes mellitus. Pertinent negatives for diabetes include no blurred vision and no chest pain. Risk factors for coronary artery disease include diabetes mellitus, dyslipidemia, hypertension, post-menopausal and sedentary lifestyle. Current diabetic treatment includes insulin injections and oral agent (monotherapy). She participates in exercise intermittently. An ACE inhibitor/angiotensin II receptor blocker is being taken. Eye exam is current.  Hypertension This is a chronic problem. The current episode started more than 1 year ago. The problem has been gradually improving since onset. The problem is controlled. Pertinent negatives include no blurred vision, chest pain, palpitations or shortness of breath. Risk factors for coronary artery disease include diabetes mellitus, dyslipidemia, post-menopausal state and sedentary lifestyle. The current treatment provides moderate improvement. Compliance problems include exercise.  Hypertensive end-organ damage includes kidney disease.     Past Medical History:  Diagnosis Date  . Diabetes mellitus ([place])   . H/O: CVA (cerebrovascular accident) 2002  . HTN (hypertension)   . Hyperlipidemia   . Pulmonary HTN ([facility])    mild by echo      Family History  Problem Relation Age of Onset  . Hypertension Mother   . Hypertension Father   . Heart Problems Father   . Hypertension Sister   . Diabetes Sister   . Hypertension Brother   . Diabetes Brother      Current Outpatient Medications:  .  ACCU-CHEK AVIVA PLUS test strip, TEST 3 TIMES DAILY, Disp: 100 each, Rfl: 11 .  amLODipine (NORVASC) 10 MG tablet, TAKE 1 TABLET BY MOUTH EVERY DAY, Disp: 90 tablet, Rfl: 1 .  aspirin 325 MG EC tablet, Take 325 mg by mouth daily., Disp: , Rfl:  .  B-D UF III MINI PEN NEEDLES 31G X 5 MM MISC, Use as directed once per day with Victoza and 2 times per day with Levemir, Disp: 200 each, Rfl: 1 .  glipiZIDE (GLUCOTROL XL) 2.5 MG 24 hr tablet, TAKE 5 MG BY MOUTH DAILY before BREAKFAST, Disp: 180 tablet, Rfl: 3 .  hydrALAZINE (APRESOLINE) 25 MG tablet, TAKE 1 TABLET(25 MG) BY MOUTH TWICE DAILY, Disp: 180 tablet, Rfl: 1 .  hydrOXYzine (ATARAX/VISTARIL) 25 MG tablet, Take 1 tablet by mouth at bedtime as needed. 1-2 tablets at bedtime prn, Disp: , Rfl:  .  Lancets (ACCU-CHEK SOFT TOUCH) lancets, Check blood sugars 3 times daily E11.65, Disp: 100 each, Rfl: 12 .  latanoprost (XALATAN) 0.005 % ophthalmic solution, Place 1 drop into both eyes at bedtime. , Disp: , Rfl:  .  metoprolol succinate (TOPROL-XL) 25 MG 24 hr tablet, Take 25 mg by mouth daily., Disp: , Rfl:  .  Multiple Vitamin (MULTIVITAMIN) tablet, Take 1 tablet by mouth daily., Disp: , Rfl:  .  Semaglutide, 1 MG/DOSE, (OZEMPIC, 1 MG/DOSE,) 2  MG/1.5ML SOPN, Inject 1 mg into the skin once a week., Disp: 6 pen, Rfl: 3 .  telmisartan-hydrochlorothiazide (MICARDIS HCT) 80-25 MG tablet, TAKE 1 TABLET BY MOUTH EVERY DAY, Disp: 90 tablet, Rfl: 1 .  TRESIBA FLEXTOUCH 200 UNIT/ML SOPN, Inject 28 Units into the skin at bedtime., Disp: 15 pen, Rfl: 5 .  rosuvastatin (CRESTOR) 20 MG tablet, Take 1 tablet (20 mg total) by mouth daily., Disp: 90 tablet, Rfl: 3   Allergies  Allergen Reactions  .  Brimonidine Other (See Comments)    Eyes swell and itch  . Dorzolamide     Other reaction(s): Other (See Comments) Red, inflamed eyelid, dermatitis   . Timolol Other (See Comments)    Caused eyes to swell and itch     Review of Systems  Constitutional: Negative.   Eyes: Negative for blurred vision.  Respiratory: Negative.  Negative for shortness of breath.   Cardiovascular: Negative.  Negative for chest pain and palpitations.  Gastrointestinal: Negative.   Neurological: Positive for numbness.       She c/o tingling/numbness in her upr/lower extremities. Denies UE/LE weakness.      Today's Vitals   03/15/20 0839  BP: 140/72  Pulse: 71  Temp: 98.1 F (36.7 C)  TempSrc: Oral  Weight: 151 lb 3.2 oz (68.6 kg)  Height: 4' 11.8" (1.519 m)   Body mass index is 29.73 kg/m.  Wt Readings from Last 3 Encounters:  03/15/20 151 lb 3.2 oz (68.6 kg)  11/27/19 149 lb (67.6 kg)  10/28/19 143 lb 6.4 oz (65 kg)     Objective:  Physical Exam Vitals and nursing note reviewed.  Constitutional:      Appearance: Normal appearance.  HENT:     Head: Normocephalic and atraumatic.  Cardiovascular:     Rate and Rhythm: Normal rate and regular rhythm.     Pulses:          Dorsalis pedis pulses are 1+ on the right side and 1+ on the left side.     Heart sounds: Normal heart sounds.  Pulmonary:     Effort: Pulmonary effort is normal.     Breath sounds: Normal breath sounds.  Feet:     Right foot:     Protective Sensation: 5 sites tested. 5 sites sensed.     Skin integrity: Dry skin present.     Toenail Condition: Right toenails are normal.     Left foot:     Protective Sensation: 5 sites tested. 5 sites sensed.     Skin integrity: Dry skin present.     Toenail Condition: Left toenails are normal.  Skin:    General: Skin is warm.  Neurological:     General: No focal deficit present.     Mental Status: She is alert.  Psychiatric:        Mood and Affect: Mood normal.        Behavior:  Behavior normal.         Assessment And Plan:     1. Uncontrolled type 2 diabetes mellitus with circulatory disorder, with long-term current use of insulin ([place]) Comments: She reports her eye exam was last week. I will request her records from Dr. Cordelia Pen. Diabetic foot exam was performed. I will check labs as listed below.  - CMP14+EGFR - Hemoglobin A1c - Lipid panel  2. Hypertensive nephropathy Comments: Chronic, fair control. She will c/w current meds. States her BP is 120s/70s at home. Encouraged to follow low sodium diet.  3. Pure hypercholesterolemia Comments: Chronic, reports compliance with rosuvastatin. Atorvastatin was listed in her chart, so chart was updated appropriately.  - Lipid panel  4. Paresthesia Comments: Pt advised her sx are likely due to diabetic neuropathy. I will check thyroid function and vitamin B12 levels. If normal, will consider gabapentin.  - TSH  5. Overweight (BMI 25.0-29.9) Comments: BMI 29. Her BMI is acceptable for her demographic. She is encouraged to incorporate more exercise into her daily routine.   6. Drug therapy - Vitamin B12    Patient was given opportunity to ask questions. Patient verbalized understanding of the plan and was able to repeat key elements of the plan. All questions were answered to their satisfaction.  Maximino Greenland, MD   I, Maximino Greenland, MD, have reviewed all documentation for this visit. The documentation on 03/15/20 for the exam, diagnosis, procedures, and orders are all accurate and complete.  THE PATIENT IS ENCOURAGED TO PRACTICE SOCIAL DISTANCING DUE TO THE COVID-19 PANDEMIC.

## 2020-03-15 NOTE — Patient Instructions (Signed)

## 2020-03-16 LAB — HEMOGLOBIN A1C
Est. average glucose Bld gHb Est-mCnc: 252 mg/dL
Hgb A1c MFr Bld: 10.4 % — ABNORMAL HIGH (ref 4.8–5.6)

## 2020-03-16 LAB — CMP14+EGFR
ALT: 27 IU/L (ref 0–32)
AST: 27 IU/L (ref 0–40)
Albumin/Globulin Ratio: 1.3 (ref 1.2–2.2)
Albumin: 4.3 g/dL (ref 3.6–4.6)
Alkaline Phosphatase: 120 IU/L (ref 44–121)
BUN/Creatinine Ratio: 18 (ref 12–28)
BUN: 47 mg/dL — ABNORMAL HIGH (ref 8–27)
Bilirubin Total: <0.2 mg/dL (ref 0.0–1.2)
CO2: 24 mmol/L (ref 20–29)
Calcium: 9.5 mg/dL (ref 8.7–10.3)
Chloride: 99 mmol/L (ref 96–106)
Creatinine, Ser: 2.55 mg/dL — ABNORMAL HIGH (ref 0.57–1.00)
GFR calc Af Amer: 20 mL/min/{1.73_m2} — ABNORMAL LOW (ref >59)
GFR calc non Af Amer: 17 mL/min/{1.73_m2} — ABNORMAL LOW (ref >59)
Globulin, Total: 3.2 g/dL (ref 1.5–4.5)
Glucose: 262 mg/dL — ABNORMAL HIGH (ref 65–99)
Potassium: 3.7 mmol/L (ref 3.5–5.2)
Sodium: 140 mmol/L (ref 134–144)
Total Protein: 7.5 g/dL (ref 6.0–8.5)

## 2020-03-16 LAB — VITAMIN B12: Vitamin B-12: 1310 pg/mL — ABNORMAL HIGH (ref 232–1245)

## 2020-03-16 LAB — TSH: TSH: 1.41 u[IU]/mL (ref 0.450–4.500)

## 2020-03-16 LAB — LIPID PANEL
Chol/HDL Ratio: 4.1 ratio (ref 0.0–4.4)
Cholesterol, Total: 122 mg/dL (ref 100–199)
HDL: 30 mg/dL — ABNORMAL LOW (ref >39)
LDL Chol Calc (NIH): 60 mg/dL (ref 0–99)
Triglycerides: 190 mg/dL — ABNORMAL HIGH (ref 0–149)
VLDL Cholesterol Cal: 32 mg/dL (ref 5–40)

## 2020-03-17 ENCOUNTER — Other Ambulatory Visit: Payer: Self-pay | Admitting: Internal Medicine

## 2020-03-17 DIAGNOSIS — IMO0002 Reserved for concepts with insufficient information to code with codable children: Secondary | ICD-10-CM

## 2020-03-17 DIAGNOSIS — E1159 Type 2 diabetes mellitus with other circulatory complications: Secondary | ICD-10-CM

## 2020-03-17 DIAGNOSIS — E1165 Type 2 diabetes mellitus with hyperglycemia: Secondary | ICD-10-CM

## 2020-03-22 ENCOUNTER — Telehealth: Payer: Self-pay

## 2020-03-26 ENCOUNTER — Other Ambulatory Visit: Payer: Self-pay

## 2020-03-26 DIAGNOSIS — N1832 Chronic kidney disease, stage 3b: Secondary | ICD-10-CM

## 2020-03-30 ENCOUNTER — Encounter: Payer: Self-pay | Admitting: Internal Medicine

## 2020-03-30 ENCOUNTER — Ambulatory Visit (INDEPENDENT_AMBULATORY_CARE_PROVIDER_SITE_OTHER): Payer: Medicare Other | Admitting: Internal Medicine

## 2020-03-30 ENCOUNTER — Other Ambulatory Visit: Payer: Self-pay

## 2020-03-30 VITALS — BP 120/70 | Ht 59.0 in | Wt 151.0 lb

## 2020-03-30 DIAGNOSIS — E1159 Type 2 diabetes mellitus with other circulatory complications: Secondary | ICD-10-CM | POA: Diagnosis not present

## 2020-03-30 DIAGNOSIS — Z794 Long term (current) use of insulin: Secondary | ICD-10-CM

## 2020-03-30 DIAGNOSIS — IMO0002 Reserved for concepts with insufficient information to code with codable children: Secondary | ICD-10-CM

## 2020-03-30 DIAGNOSIS — E663 Overweight: Secondary | ICD-10-CM

## 2020-03-30 DIAGNOSIS — E7800 Pure hypercholesterolemia, unspecified: Secondary | ICD-10-CM | POA: Diagnosis not present

## 2020-03-30 DIAGNOSIS — E1165 Type 2 diabetes mellitus with hyperglycemia: Secondary | ICD-10-CM | POA: Diagnosis not present

## 2020-03-30 MED ORDER — BD PEN NEEDLE MINI U/F 31G X 5 MM MISC: 3 refills | Status: DC

## 2020-03-30 MED ORDER — HUMALOG KWIKPEN 200 UNIT/ML ~~LOC~~ SOPN: PEN_INJECTOR | SUBCUTANEOUS | 3 refills | Status: DC

## 2020-03-30 NOTE — Progress Notes (Signed)
Patient ID: TIFFINEY SPARROW, female   DOB: [birth year], 79 y.o.   MRN: [id number]   This visit occurred during the SARS-CoV-2 public health emergency.  Safety protocols were in place, including screening questions prior to the visit, additional usage of staff PPE, and extensive cleaning of exam room while observing appropriate contact time as indicated for disinfecting solutions.   HPI: JAKYLAH BASSINGER is a 79 y.o.-year-old female, returning for follow-up for DM2, dx in the 1980s, insulin-dependent since ~2010, uncontrolled, with complications (dCHF, Cerebrovascular disease - history of CVA 2002, CKD, DR, PN).  Last visit 4 months ago.  Reviewed HbA1c levels: Lab Results  Component Value Date   HGBA1C 10.4 (H) 03/15/2020   HGBA1C 8.8 (A) 11/27/2019   HGBA1C 7.7 (H) 09/11/2019   HGBA1C 8.8 (A) 07/03/2019   HGBA1C 8.9 (H) 02/13/2019   HGBA1C 9.4 (A) 10/09/2018   HGBA1C 9.6 (A) 06/14/2018   HGBA1C 8.4 (A) 02/26/2018  09/10/2017: HbA1c 10.4%  Pt is on a regimen of: - Glipizide ER 2.5 >> 5 mg before breakfast - Tresiba 28 >> 26 >> 20 >> but increased since last visit to 28 units daily - Ozempic 0.5 mg weekly-added 06/2018 >> increased to 1 mg weekly 06/2019 In 02/2018: We stopped Onglyza since this was expensive and started glipizide ER low-dose. She was previously also on Levemir 35 units daily. She was previously on Metformin but stopped 10/2019 due to worsening kidney function.  Pt checks her sugars once a day: - am: 59-141, 197-224 if she forgets insulin >> 80-125 >> 64-190, 216, 330 - 2h after b'fast: n/c >> 269 >> n/c - before lunch: n/c >> ?, 190 >> n/c >> 246 - 2h after lunch: n/c >> 199 >> n/c - before dinner: n/c  - 2h after dinner: n/c - bedtime: n/c >> 98, 151-352 - nighttime: n/c Lowest sugar was 59 >> 80 >> 64; she has hypoglycemia awareness in the 70s. Highest sugar was  441 (missed insulin) ... >> 200 >> 352.  Glucometer: Accu-Chek [employer]  Pt's meals are - eats 2x a  day: - Breakfast: oatmeal + bacon - Lunch: veggies and a piece of chicken - small meal - Dinner: chicken, veggies - Snacks: apples, oranges  -+ CKD, last BUN/creatinine:  Lab Results  Component Value Date   BUN 47 (H) 03/15/2020   BUN 55 (H) 10/14/2019   CREATININE 2.55 (H) 03/15/2020   CREATININE 1.95 (H) 10/14/2019  09/10/2017: 27/1.52, GFR 37 On telmisartan 80.  -+ HL; lipids: Lab Results  Component Value Date   CHOL 122 03/15/2020   HDL 30 (L) 03/15/2020   LDLCALC 60 03/15/2020   TRIG 190 (H) 03/15/2020   CHOLHDL 4.1 03/15/2020  08/14/2017: 151/187/44/70 On Crestor 20, increased at last visit.  She is off Vascepa.  - last eye exam was in 02/2020: reportedly + Moderate NPDR OD, + macular edema.  She was taking IO injections.  Her stroke affected her left eye vision.  -She has numbness and tingling in her feet  On ASA 325.  Pt has FH of DM in father.  TFTs reviewed: Lab Results  Component Value Date   TSH 1.410 03/15/2020  06/07/2016: 2.110.  ROS: Constitutional: no weight gain/no weight loss, no fatigue, no subjective hyperthermia, no subjective hypothermia Eyes: no blurry vision, no xerophthalmia ENT: no sore throat, no nodules palpated in neck, no dysphagia, no odynophagia, no hoarseness Cardiovascular: no CP/no SOB/no palpitations/no leg swelling Respiratory: no cough/no SOB/no wheezing Gastrointestinal: no N/no V/no  D/no C/no acid reflux Musculoskeletal: no muscle aches/no joint aches Skin: no rashes, no hair loss Neurological: no tremors/+ numbness/+ tingling/no dizziness  I reviewed pt's medications, allergies, PMH, social hx, family hx, and changes were documented in the history of present illness. Otherwise, unchanged from my initial visit note.  Past Medical History:  Diagnosis Date  . Diabetes mellitus ([place])   . H/O: CVA (cerebrovascular accident) 2002  . HTN (hypertension)   . Hyperlipidemia   . Pulmonary HTN ([place])    mild by echo   Past  Surgical History:  Procedure Laterality Date  . ABDOMINAL HYSTERECTOMY    . CARDIAC CATHETERIZATION  03/17/2003   Normal, patent coronary arteries. Normal LV systolic function  . CARDIOVASCULAR STRESS TEST  08/28/2006   Small sized, moderate ischemiain the Basal Inferoseptal, Basal Inferior, Mid Inferior and Apical Inferior regions  . TRANSTHORACIC ECHOCARDIOGRAM  02/17/2010   EF >55%, moderate-severely thickened septum, mild-moderate mitral annular calcification   Social History   Socioeconomic History  . Marital status: Married    Spouse name: Not on file  . Number of children: Not on file  . Years of education: Not on file  . Highest education level: Not on file  Occupational History  . Occupation: retired  Tobacco Use  . Smoking status: Never Smoker  . Smokeless tobacco: Never Used  Vaping Use  . Vaping Use: Never used  Substance and Sexual Activity  . Alcohol use: No  . Drug use: Never  . Sexual activity: Not Currently  Other Topics Concern  . Not on file  Social History Narrative  . Not on file   Social Determinants of Health   Financial Resource Strain: Low Risk   . Difficulty of Paying Living Expenses: Not hard at all  Food Insecurity: No Food Insecurity  . Worried About Charity fundraiser in the Last Year: Never true  . Ran Out of Food in the Last Year: Never true  Transportation Needs: No Transportation Needs  . Lack of Transportation (Medical): No  . Lack of Transportation (Non-Medical): No  Physical Activity: Inactive  . Days of Exercise per Week: 0 days  . Minutes of Exercise per Session: 0 min  Stress: No Stress Concern Present  . Feeling of Stress : Not at all  Social Connections: Not on file  Intimate Partner Violence: Not on file   Current Outpatient Medications on File Prior to Visit  Medication Sig Dispense Refill  . ACCU-CHEK AVIVA PLUS test strip TEST 3 TIMES DAILY 100 each 11  . amLODipine (NORVASC) 10 MG tablet TAKE 1 TABLET BY MOUTH EVERY  DAY 90 tablet 1  . aspirin 325 MG EC tablet Take 325 mg by mouth daily.    . B-D UF III MINI PEN NEEDLES 31G X 5 MM MISC Use as directed once per day with Victoza and 2 times per day with Levemir 200 each 1  . glipiZIDE (GLUCOTROL XL) 2.5 MG 24 hr tablet TAKE 5 MG BY MOUTH DAILY before BREAKFAST 180 tablet 3  . hydrALAZINE (APRESOLINE) 25 MG tablet TAKE 1 TABLET(25 MG) BY MOUTH TWICE DAILY 180 tablet 1  . hydrOXYzine (ATARAX/VISTARIL) 25 MG tablet Take 1 tablet by mouth at bedtime as needed. 1-2 tablets at bedtime prn    . Lancets (ACCU-CHEK SOFT TOUCH) lancets Check blood sugars 3 times daily E11.65 100 each 12  . latanoprost (XALATAN) 0.005 % ophthalmic solution Place 1 drop into both eyes at bedtime.     . metoprolol succinate (  TOPROL-XL) 25 MG 24 hr tablet Take 25 mg by mouth daily.    . Multiple Vitamin (MULTIVITAMIN) tablet Take 1 tablet by mouth daily.    . rosuvastatin (CRESTOR) 20 MG tablet Take 1 tablet (20 mg total) by mouth daily. 90 tablet 3  . Semaglutide, 1 MG/DOSE, (OZEMPIC, 1 MG/DOSE,) 2 MG/1.5ML SOPN Inject 1 mg into the skin once a week. 6 pen 3  . telmisartan-hydrochlorothiazide (MICARDIS HCT) 80-25 MG tablet TAKE 1 TABLET BY MOUTH EVERY DAY 90 tablet 1  . TRESIBA FLEXTOUCH 200 UNIT/ML FlexTouch Pen INJECT 28 UNITS INTO THE SKIN AT BEDTIME 15 mL 1   No current facility-administered medications on file prior to visit.   Allergies  Allergen Reactions  . Brimonidine Other (See Comments)    Eyes swell and itch  . Dorzolamide     Other reaction(s): Other (See Comments) Red, inflamed eyelid, dermatitis   . Timolol Other (See Comments)    Caused eyes to swell and itch   Family History  Problem Relation Age of Onset  . Hypertension Mother   . Hypertension Father   . Heart Problems Father   . Hypertension Sister   . Diabetes Sister   . Hypertension Brother   . Diabetes Brother    PE: BP 120/70   Ht 4\' 11"  (1.499 m)   Wt 151 lb (68.5 kg)   BMI 30.50 kg/m   Wt  Readings from Last 3 Encounters:  03/30/20 151 lb (68.5 kg)  03/15/20 151 lb 3.2 oz (68.6 kg)  11/27/19 149 lb (67.6 kg)   Constitutional: + Slightly overweight, in NAD Eyes: PERRLA, EOMI, no exophthalmos ENT: moist mucous membranes, no thyromegaly, no cervical lymphadenopathy Cardiovascular: RRR, No MRG Respiratory: CTA B Gastrointestinal: abdomen soft, NT, ND, BS+ Musculoskeletal: no deformities, strength intact in all 4 Skin: moist, warm, no rashes Neurological: no tremor with outstretched hands, DTR normal in all 4  ASSESSMENT: 1. DM2, insulin-dependent, uncontrolled, with long-term complications - dCHF - Cerebrovascular disease, history of CVA 2002 - CKD - DR - PN  2. HL  3.  Overweight  PLAN:  1. Patient with longstanding, uncontrolled, type 2 diabetes, on sulfonylurea, long-acting insulin, and GLP-1 receptor agonist.  She has a history of CHF so ideally should be on an SGLT2 inhibitor but her GFR is too low to start it for now.  She had problems in the past remembering to take her medicines and we discussed about setting alarms on her phone to do so.  I also advised her to try to reduce eating out.  At last visit, HbA1c was higher, at 8.8% after stopping Metformin.  Sugars in the morning were at goal but she was not checking sugars later in the day and I strongly advised her to do so and I gave her sugar logs.  I advised her to send me her sugars in 2 weeks to see if we needed to add mealtime insulin but she did not do so.  She had another HbA1c obtained this month and this was much higher, at 10.4%. -At today's visit, sugars are closer to goal in the morning with 1 low blood sugar at 64, however, later in the day, they are quite high, in the 200s and even 300s.  At this point, we discussed the need to add mealtime insulin.  I advised him to start Humalog 8 to 10 units 15 minutes before each meal.  I advised her that the lag time was 60 minutes is very important.  Also, we  discussed about how to change the dose of Humalog depending on the size and consistency of her meals.  We will keep the rest of the regimen the same, including the higher dose of Tresiba, 28 units daily. - I suggested to:  Patient Instructions  Please continue: - Glipizide ER 5 mg before breakfast but stop after you run out - Ozempic 1 mg weekly - Tresiba 28 units daily in a.m.  Please add: - Humalog 8-10 units 15 min before meals  Please come back for a follow-up appointment in 3 months.  - advised to check sugars at different times of the day - 1-2x a day, rotating check times - advised for yearly eye exams >> she is UTD - return to clinic in 3 months  2. HL -Reviewed latest lipid panel from earlier this month: LDL at goal, triglycerides slightly high, HDL low: Lab Results  Component Value Date   CHOL 122 03/15/2020   HDL 30 (L) 03/15/2020   LDLCALC 60 03/15/2020   TRIG 190 (H) 03/15/2020   CHOLHDL 4.1 03/15/2020  -At last visit we increased her Crestor from 10 mg daily to 20 mg daily and advised her to take it consistently -she is not taking the higher dose.  We also discussed about the absolute need to improve her diet.  3.  Overweight -We will continue Ozempic which should also help with weight loss -At last visit, weight was stable from the previous visit. Now gained 2 lbs.  Philemon Kingdom, MD PhD [facility]

## 2020-03-30 NOTE — Patient Instructions (Addendum)
Please continue: - Glipizide ER 5 mg before breakfast but stop after you run out - Ozempic 1 mg weekly - Tresiba 28 units daily in a.m.  Please add: - Humalog 8-10 units 15 min before meals  Please come back for a follow-up appointment in 3 months.

## 2020-04-17 ENCOUNTER — Other Ambulatory Visit: Payer: Self-pay | Admitting: Internal Medicine

## 2020-04-19 DIAGNOSIS — D631 Anemia in chronic kidney disease: Secondary | ICD-10-CM | POA: Diagnosis not present

## 2020-04-19 DIAGNOSIS — N179 Acute kidney failure, unspecified: Secondary | ICD-10-CM | POA: Diagnosis not present

## 2020-04-19 DIAGNOSIS — E1122 Type 2 diabetes mellitus with diabetic chronic kidney disease: Secondary | ICD-10-CM | POA: Diagnosis not present

## 2020-04-19 DIAGNOSIS — N184 Chronic kidney disease, stage 4 (severe): Secondary | ICD-10-CM | POA: Diagnosis not present

## 2020-04-19 DIAGNOSIS — R801 Persistent proteinuria, unspecified: Secondary | ICD-10-CM | POA: Diagnosis not present

## 2020-04-19 DIAGNOSIS — I129 Hypertensive chronic kidney disease with stage 1 through stage 4 chronic kidney disease, or unspecified chronic kidney disease: Secondary | ICD-10-CM | POA: Diagnosis not present

## 2020-04-19 DIAGNOSIS — N189 Chronic kidney disease, unspecified: Secondary | ICD-10-CM | POA: Diagnosis not present

## 2020-04-27 ENCOUNTER — Other Ambulatory Visit: Payer: Self-pay | Admitting: Nephrology

## 2020-04-27 DIAGNOSIS — N184 Chronic kidney disease, stage 4 (severe): Secondary | ICD-10-CM

## 2020-04-28 ENCOUNTER — Ambulatory Visit: Payer: Medicare Other

## 2020-04-28 DIAGNOSIS — IMO0002 Reserved for concepts with insufficient information to code with codable children: Secondary | ICD-10-CM

## 2020-04-28 DIAGNOSIS — E7800 Pure hypercholesterolemia, unspecified: Secondary | ICD-10-CM

## 2020-04-28 DIAGNOSIS — E785 Hyperlipidemia, unspecified: Secondary | ICD-10-CM

## 2020-04-28 DIAGNOSIS — E1169 Type 2 diabetes mellitus with other specified complication: Secondary | ICD-10-CM

## 2020-04-28 DIAGNOSIS — E1165 Type 2 diabetes mellitus with hyperglycemia: Secondary | ICD-10-CM

## 2020-04-28 NOTE — Chronic Care Management (AMB) (Signed)
Chronic Care Management Pharmacy  Name: [facility]  MRN: [id number] DOB: 06-Nov-1940  Chief Complaint/ HPI  [name] [name],  80 y.o. , female presents for their Follow-Up CCM visit with the clinical pharmacist via telephone due to COVID-19 Pandemic. She tries to make sure she is healthy. She has been married for over 21 years. She walks to the mailbox everyday for about a quarter of a mile. She used to go to the senior center but due to [place] she no longer goes. She goes to the grocery store and to church everyone once in a while. In the morning she has oatmeal and sometimes cheerios. She was born in [place] but she was raised in [place]. When she got married she worked at [employer] and [employer]. She raised 8 children  She has had two pass away. She is a great grandma. She is sleeping well and she usually goes to bed when she is ready to and wakes up when she is ready. She is always well rested.  She is taking ASA 325 mg since she had a stroke in 2001.   PCP : Glendale Chard, MD  Their chronic conditions include: Hypertension, Hyperlipidemia, Diabetes  Office Visits: 03/15/2020 MD OV  Consult Visit: 11/27/19 Endocrinology OV w/ Dr. Cruzita Lederer: HgbA1c up to 8.8%. Advised pt to check blood sugars twice daily. Increase glipizide ER to '5mg'$  in the morning. Continue Ozempic '1mg'$  weekly and Tresiba 20 units daily in the morning. Increase rosuvastatin to '20mg'$  daily. Follow up in 3 months. Call with blood sugars in 2 weeks.   09/18/19 Orthotics fitting for diabetic shoe inserts  09/11/19 Ophthalmology OV w/ Dr. Cordelia Pen: 7 week f/u with OCT scan.Pt reported excessive watering OS, but OD vision improved. Edema present. Avastin OD today. Follow up in 3 months.     Medications: Outpatient Encounter Medications as of 04/28/2020  Medication Sig  . ACCU-CHEK AVIVA PLUS test strip TEST 3 TIMES DAILY  . amLODipine (NORVASC) 10 MG tablet TAKE 1 TABLET BY MOUTH EVERY DAY   . aspirin 325 MG EC tablet Take 325 mg by mouth daily.  . B-D UF III MINI PEN NEEDLES 31G X 5 MM MISC Use as directed 4x a day  . hydrALAZINE (APRESOLINE) 25 MG tablet TAKE 1 TABLET(25 MG) BY MOUTH TWICE DAILY  . hydrOXYzine (ATARAX/VISTARIL) 25 MG tablet Take 1 tablet by mouth at bedtime as needed. 1-2 tablets at bedtime prn  . insulin lispro (HUMALOG KWIKPEN) 200 UNIT/ML KwikPen Inject under skin 8-10 units 15 min before the 3 main meals  . Lancets (ACCU-CHEK SOFT TOUCH) lancets Check blood sugars 3 times daily E11.65  . latanoprost (XALATAN) 0.005 % ophthalmic solution Place 1 drop into both eyes at bedtime.   . metoprolol succinate (TOPROL-XL) 25 MG 24 hr tablet Take 25 mg by mouth daily.  . Multiple Vitamin (MULTIVITAMIN) tablet Take 1 tablet by mouth daily.  . rosuvastatin (CRESTOR) 10 MG tablet TAKE 1 TABLET BY MOUTH EVERY DAY  . rosuvastatin (CRESTOR) 20 MG tablet Take 1 tablet (20 mg total) by mouth daily.  . Semaglutide, 1 MG/DOSE, (OZEMPIC, 1 MG/DOSE,) 2 MG/1.5ML SOPN Inject 1 mg into the skin once a week.  . telmisartan-hydrochlorothiazide (MICARDIS HCT) 80-25 MG tablet TAKE 1 TABLET BY MOUTH EVERY DAY  . TRESIBA FLEXTOUCH 200 UNIT/ML FlexTouch Pen INJECT 28 UNITS INTO THE SKIN AT BEDTIME   No facility-administered encounter medications on file as of 04/28/2020.    Current Diagnosis/Assessment:  Goals Addressed            This Visit's Progress   . Pharmacy Care Plan       CARE PLAN ENTRY (see longitudinal plan of care for additional care plan information)  Current Barriers:  . Chronic Disease Management support, education, and care coordination needs related to Hypertension, Hyperlipidemia, and Diabetes   Hypertension BP Readings from Last 3 Encounters:  09/04/19 (!) 156/70  09/04/19 (!) 156/70  07/03/19 (!) 150/58   . Pharmacist Clinical Goal(s): o Over the next 90 days, patient will work with PharmD and providers to achieve BP goal <130/80 . Current  regimen:   Amlodipine '10mg'$  daily  Hydralazine '25mg'$  twice daily  Metoprolol succinate '25mg'$  daily  Telmisartan/HCTZ 80/'25mg'$  daily . Interventions: o Dietary and exercise recommendations provided o Recommend patient check blood pressure in the morning . Patient self care activities - Over the next 30 days, patient will: o Check BP daily in the morning, document, and provide at future appointments o Ensure daily salt intake < 2300 mg/day o Cut back on bacon consumption o Try to exercise for 30 minutes 5 times weekly (150  minutes per week)  Hyperlipidemia Lab Results  Component Value Date/Time   LDLCALC 60 03/15/2020 11:49 AM   . Pharmacist Clinical Goal(s): o Over the next 90 days, patient will work with PharmD and providers to maintain LDL goal < 70 . Current regimen:  o Rosuvastatin '20mg'$  daily . Interventions: o Dietary and exercise recommendations provided o Rosuvastatin recently increased to '20mg'$  daily by endocrinologist o Collaborate with endocrinologist and recommend changing rosuvastatin to atorvastatin due to patient's kidney function . Patient self care activities - Over the next 90 days, patient will: o Take cholesterol medication daily as directed o Exercise 30 minutes daily 5 times per week (150 minutes per week) o Increase healthy fats in diet: avocados, flaxseed, walnuts, etc o Limit trans and saturated fats  Diabetes Lab Results  Component Value Date/Time   HGBA1C 7.7 (H) 09/11/2019 09:02 AM   HGBA1C 8.8 (A) 07/03/2019 11:38 AM   HGBA1C 8.9 (H) 02/13/2019 02:10 PM   . Pharmacist Clinical Goal(s): o Over the next 90 days, patient will work with PharmD and providers to achieve A1c goal <7% . Current regimen:   Glipizide XL '5mg'$  daily with breakfast  Humalog 8-10 units 15 min before the 3 main meals.    Ozempic '1mg'$  weekly  Tresiba 28 units at bedtime . Interventions: o Provided dietary and exercise recommendations o Determined glipizide was increased  to '5mg'$  daily after metformin was discontinued due to kidney function . Patient self care activities - Over the next 90 days, patient will: o Check blood sugar twice daily, document, and provide at future appointments o Contact provider with any episodes of hypoglycemia o Exercise 30 minutes daily 5 times per week (150 minutes per week)  Medication management . Pharmacist Clinical Goal(s): o Over the next 90 days, patient will work with PharmD and providers to achieve optimal medication adherence . Current pharmacy: [facility] . Interventions o Comprehensive medication review performed. o Continue current medication management strategy . Patient self care activities - Over the next 90 days, patient will: o Focus on medication adherence by utilizing pill box o Take medications as prescribed o Report any questions or concerns to PharmD and/or provider(s)  Please see past updates related to this goal by clicking on the "Past Updates" button in the selected goal         Diabetes  A1c Goal <7% Recent Relevant Labs: Lab Results  Component Value Date/Time   HGBA1C 10.4 (H) 03/15/2020 11:49 AM   HGBA1C 8.8 (A) 11/27/2019 12:46 PM   HGBA1C 7.7 (H) 09/11/2019 09:02 AM   MICROALBUR 150 09/04/2019 02:19 PM   MICROALBUR 323.9 (H) 10/09/2018 09:05 AM   Kidney Function Lab Results  Component Value Date/Time   CREATININE 2.55 (H) 03/15/2020 11:49 AM   CREATININE 1.95 (H) 10/14/2019 10:11 AM   CREATININE 1.95 (H) 10/09/2018 09:05 AM   GFRNONAA 17 (L) 03/15/2020 11:49 AM   GFRNONAA 24 (L) 10/09/2018 09:05 AM   GFRAA 20 (L) 03/15/2020 11:49 AM   GFRAA 27 (L) 10/09/2018 09:05 AM    Checking BG: 2x per Day , in the morning and prior to going to bed    Recent FBG Readings: 121-80  Recent pre-meal BG readings:  Recent 2hr PP BG readings:   Recent HS BG readings:  Patient has failed these meds in past: Levemir, metformin Patient is currently uncontrolled on the following  medications:   Glipizide XL '5mg'$  daily with breakfast  Humalog 8-10 units 15 min before the 3 main meals.  Patient has not received this medication because the pharmacy reports they do no have the prescription. Confirmed the prescription was available   Ozempic '1mg'$  weekly on friday  Tresiba 20 units at bedtime  Last diabetic Foot exam: 09/04/19 Last diabetic Eye exam: 05/23/18 Lab Results  Component Value Date/Time   HMDIABEYEEXA Retinopathy (A) 05/23/2018 12:00 AM    We discussed:   Pt reports that she has not received her Humalog as of today but she reports that she will pick it up  Checking BS at least three times per day   We discussed diet and exercise extensively:  Patient only drinks water   Only eats twice a day morning and evening   Eating more vegetables including green leafy vegetables   We discussed the importance of cooking at home and not purchasing a lot fast food.   Yesterday for dinner :   She had chinese food mixed vegetables and chicken and brown rice  Explained the importance of healthy eating   We discussed signs and symptoms of Hypoglycemia   Pt reports that when her BS gets to low she can get weak, but that has not happened . Exercise extensively o Walks for 15-20 minutes most days o Recommend pt get 30 minutes of moderate intensity exercise daily 5 times a week (150 minutes total per week)  Plan Continue current medications  CPA to call patient once a week to check on blood sugar readings Medication was placed on file by [facility] and it is now filled today it will be $35 for a 90 day supply - confirmed with [place] at [place]   Hypertension   Office blood pressures are  BP Readings from Last 3 Encounters:  03/30/20 120/70  03/15/20 140/72  11/27/19 (!) 148/60   Patient has failed these meds in the past: Metoprolol succinate, valsartan, valsartan/HCTZ Patient is currently uncontrolled on the following medications:    Amlodipine '10mg'$  daily  Hydralazine '25mg'$  twice daily  Metoprolol succinate '25mg'$  daily  Telmisartan/HCTZ 80/'25mg'$  daily  Patient checks BP at home daily  Patient home BP readings are ranging: around  120/70  We discussed: . Diet and exercise extensively o Does not use salt o Does eat fast food   Checks BP in the morning when she first gets up  Home BP readings still elevated  Plan Continue  current medications   Hyperlipidemia   LDL Goal <70  Lipid Panel     Component Value Date/Time   CHOL 122 03/15/2020 1149   TRIG 190 (H) 03/15/2020 1149   HDL 30 (L) 03/15/2020 1149   LDLCALC 60 03/15/2020 1149     The ASCVD Risk score (Goff DC Jr., et al., 2013) failed to calculate for the following reasons:   The 2013 ASCVD risk score is only valid for ages 24 to 40   Patient has failed these meds in past: N/A Patient is currently controlled on the following medications:   Rosuvastatin 10 mg daily   We Discussed:   Diet and Exercise Extensively:   Increasing leafy green vegetables.  Decrease starches    Exercising by going to the mailbox once a day and walking a 1/2 mile  Plan Continue current medications  Will discontinue Rosuvustatin 20 mg daily from patients current medication list, and follow up with [facility] to make sure it has not been picked up and it is discontinued    Vaccines   Reviewed and discussed patient's vaccination history.    Immunization History  Administered Date(s) Administered  . Fluad Quad(high Dose 65+) 12/12/2018  . Influenza, High Dose Seasonal PF 01/08/2018, 01/02/2019  . Influenza-Unspecified 04/10/2017  . Moderna Sars-Covid-2 Vaccination 05/22/2019, 06/20/2019, 01/30/2020  . Pneumococcal Conjugate-13 01/07/2014  . Pneumococcal Polysaccharide-23 03/31/2014  . Pneumococcal-Unspecified 03/31/2014  . Zoster 12/31/2007   Shingrix Vaccine - administered at [facility] in October 2021  [employer]  Second dose of Shingrix vaccine at  local pharmacy Patient would like [place] to call the pharmacy to schedule the appointment  Medication Management   Pt uses [facility] for all medications Uses pill box? Yes Pt endorses 100% compliance  We discussed:   Importance of taking medications daily as directed  Denies missing doses of medications  Plan Continue current medication management strategy   Follow up: 1 month phone visit  [name], PharmD Clinical Pharmacist [facility] [phone]

## 2020-04-29 ENCOUNTER — Telehealth: Payer: Self-pay

## 2020-04-29 NOTE — Chronic Care Management (AMB) (Signed)
° ° °  Chronic Care Management Pharmacy Assistant   Name: [name] [name]  MRN: [id number] DOB: 06-Aug-1940  Reason for Encounter: Patient Assistance Coordination  PCP : Glendale Chard, MD    04/29/2020- Filling out patient assistance forms for Ozempic and Tresiba with [employer] patient assistance program. Called patient to inform that forms are filled out, awaiting her signature, income documentation and provider signature. Patient states she would like form to be mailed to her. Placing patient portion of form into the mail for patient to sign and return to PCP office.  [name], CPP notified.    Allergies:   Allergies  Allergen Reactions   Brimonidine Other (See Comments)    Eyes swell and itch   Dorzolamide     Other reaction(s): Other (See Comments) Red, inflamed eyelid, dermatitis    Timolol Other (See Comments)    Caused eyes to swell and itch    Medications: Outpatient Encounter Medications as of 04/29/2020  Medication Sig   ACCU-CHEK AVIVA PLUS test strip TEST 3 TIMES DAILY   amLODipine (NORVASC) 10 MG tablet TAKE 1 TABLET BY MOUTH EVERY DAY   aspirin 325 MG EC tablet Take 325 mg by mouth daily.   B-D UF III MINI PEN NEEDLES 31G X 5 MM MISC Use as directed 4x a day   hydrALAZINE (APRESOLINE) 25 MG tablet TAKE 1 TABLET(25 MG) BY MOUTH TWICE DAILY   hydrOXYzine (ATARAX/VISTARIL) 25 MG tablet Take 1 tablet by mouth at bedtime as needed. 1-2 tablets at bedtime prn   insulin lispro (HUMALOG KWIKPEN) 200 UNIT/ML KwikPen Inject under skin 8-10 units 15 min before the 3 main meals   Lancets (ACCU-CHEK SOFT TOUCH) lancets Check blood sugars 3 times daily E11.65   latanoprost (XALATAN) 0.005 % ophthalmic solution Place 1 drop into both eyes at bedtime.    metoprolol succinate (TOPROL-XL) 25 MG 24 hr tablet Take 25 mg by mouth daily.   Multiple Vitamin (MULTIVITAMIN) tablet Take 1 tablet by mouth daily.   rosuvastatin (CRESTOR) 10 MG tablet TAKE 1 TABLET BY  MOUTH EVERY DAY   Semaglutide, 1 MG/DOSE, (OZEMPIC, 1 MG/DOSE,) 2 MG/1.5ML SOPN Inject 1 mg into the skin once a week.   telmisartan-hydrochlorothiazide (MICARDIS HCT) 80-25 MG tablet TAKE 1 TABLET BY MOUTH EVERY DAY   TRESIBA FLEXTOUCH 200 UNIT/ML FlexTouch Pen INJECT 28 UNITS INTO THE SKIN AT BEDTIME   No facility-administered encounter medications on file as of 04/29/2020.    Current Diagnosis: Patient Active Problem List   Diagnosis Date Noted   Hyperlipidemia associated with type 2 diabetes mellitus ([place]) 10/09/2018   Overweight (BMI 25.0-29.9) 10/09/2018   Left ventricular diastolic dysfunction, NYHA class 1 02/06/2013   HTN (hypertension)    Uncontrolled type 2 diabetes mellitus with circulatory disorder, with long-term current use of insulin [facility])      Follow-Up:  Patient [name], [place] Pharmacist Assistant [phone]

## 2020-05-06 NOTE — Patient Instructions (Signed)
Visit Information  Goals Addressed            This Visit's Progress   . Pharmacy Care Plan       CARE PLAN ENTRY (see longitudinal plan of care for additional care plan information)  Current Barriers:  . Chronic Disease Management support, education, and care coordination needs related to Hypertension, Hyperlipidemia, and Diabetes   Hypertension BP Readings from Last 3 Encounters:  09/04/19 (!) 156/70  09/04/19 (!) 156/70  07/03/19 (!) 150/58   . Pharmacist Clinical Goal(s): o Over the next 90 days, patient will work with PharmD and providers to achieve BP goal <130/80 . Current regimen:   Amlodipine '10mg'$  daily  Hydralazine '25mg'$  twice daily  Metoprolol succinate '25mg'$  daily  Telmisartan/HCTZ 80/'25mg'$  daily . Interventions: o Dietary and exercise recommendations provided o Recommend patient check blood pressure in the morning . Patient self care activities - Over the next 30 days, patient will: o Check BP daily in the morning, document, and provide at future appointments o Ensure daily salt intake < 2300 mg/day o Cut back on bacon consumption o Try to exercise for 30 minutes 5 times weekly (150  minutes per week)  Hyperlipidemia Lab Results  Component Value Date/Time   LDLCALC 60 03/15/2020 11:49 AM   . Pharmacist Clinical Goal(s): o Over the next 90 days, patient will work with PharmD and providers to maintain LDL goal < 70 . Current regimen:  o Rosuvastatin '20mg'$  daily . Interventions: o Dietary and exercise recommendations provided o Rosuvastatin recently increased to '20mg'$  daily by endocrinologist o Collaborate with endocrinologist and recommend changing rosuvastatin to atorvastatin due to patient's kidney function . Patient self care activities - Over the next 90 days, patient will: o Take cholesterol medication daily as directed o Exercise 30 minutes daily 5 times per week (150 minutes per week) o Increase healthy fats in diet: avocados, flaxseed, walnuts,  etc o Limit trans and saturated fats  Diabetes Lab Results  Component Value Date/Time   HGBA1C 7.7 (H) 09/11/2019 09:02 AM   HGBA1C 8.8 (A) 07/03/2019 11:38 AM   HGBA1C 8.9 (H) 02/13/2019 02:10 PM   . Pharmacist Clinical Goal(s): o Over the next 90 days, patient will work with PharmD and providers to achieve A1c goal <7% . Current regimen:   Glipizide XL '5mg'$  daily with breakfast  Humalog 8-10 units 15 min before the 3 main meals.    Ozempic '1mg'$  weekly  Tresiba 28 units at bedtime . Interventions: o Provided dietary and exercise recommendations o Determined glipizide was increased to '5mg'$  daily after metformin was discontinued due to kidney function . Patient self care activities - Over the next 90 days, patient will: o Check blood sugar twice daily, document, and provide at future appointments o Contact provider with any episodes of hypoglycemia o Exercise 30 minutes daily 5 times per week (150 minutes per week)  Medication management . Pharmacist Clinical Goal(s): o Over the next 90 days, patient will work with PharmD and providers to achieve optimal medication adherence . Current pharmacy: [facility] . Interventions o Comprehensive medication review performed. o Continue current medication management strategy . Patient self care activities - Over the next 90 days, patient will: o Focus on medication adherence by utilizing pill box o Take medications as prescribed o Report any questions or concerns to PharmD and/or provider(s)  Please see past updates related to this goal by clicking on the "Past Updates" button in the selected goal  The patient verbalized understanding of instructions, educational materials, and care plan provided today and agreed to receive a mailed copy of patient instructions, educational materials, and care plan.   The pharmacy team will reach out to the patient again over the next 30 days.   Mayford Knife, [facility]

## 2020-05-13 DIAGNOSIS — H34812 Central retinal vein occlusion, left eye, with macular edema: Secondary | ICD-10-CM | POA: Diagnosis not present

## 2020-05-13 DIAGNOSIS — E113311 Type 2 diabetes mellitus with moderate nonproliferative diabetic retinopathy with macular edema, right eye: Secondary | ICD-10-CM | POA: Diagnosis not present

## 2020-05-13 DIAGNOSIS — H401133 Primary open-angle glaucoma, bilateral, severe stage: Secondary | ICD-10-CM | POA: Diagnosis not present

## 2020-05-18 ENCOUNTER — Ambulatory Visit
Admission: RE | Admit: 2020-05-18 | Discharge: 2020-05-18 | Disposition: A | Discharge status: Home / Self Care | Payer: Medicare Other | Source: Ambulatory Visit | Attending: Nephrology | Admitting: Nephrology

## 2020-05-18 DIAGNOSIS — N184 Chronic kidney disease, stage 4 (severe): Secondary | ICD-10-CM | POA: Diagnosis not present

## 2020-05-18 DIAGNOSIS — N281 Cyst of kidney, acquired: Secondary | ICD-10-CM | POA: Diagnosis not present

## 2020-05-19 ENCOUNTER — Other Ambulatory Visit: Payer: Self-pay | Admitting: Internal Medicine

## 2020-05-31 ENCOUNTER — Telehealth: Payer: Self-pay

## 2020-05-31 NOTE — Progress Notes (Signed)
05/31/20- Attempted to outreach the patient to remind her of upcoming CCM Call Visit with [name], CPP on 06/01/20 at 9:45 am and to briefly speak regarding the status on completing patient assistance application that was mailed to house. No answer from the patient and unable to leave a voicemail because mailbox is full.   [name], CPP Notified.  Raynelle Highland, [place] Pharmacist Assistant [phone] CCM Total Time: 5 minutes

## 2020-06-01 ENCOUNTER — Telehealth: Payer: Self-pay

## 2020-06-15 ENCOUNTER — Other Ambulatory Visit: Payer: Self-pay | Admitting: Internal Medicine

## 2020-06-15 ENCOUNTER — Telehealth: Payer: Self-pay

## 2020-06-15 NOTE — Chronic Care Management (AMB) (Signed)
Chronic Care Management Pharmacy Assistant   Name: [name] [name]  MRN: [id number] DOB: 03-30-1941  Reason for Encounter: Diabetes Adherence Call    Recent office visits:  None.  Recent consult visits:  05/13/2020-Greven, Harmon Dun, MD (Procedure Visit)-Ophthalmology.  Hospital visits:  None in previous 6 months  Medications: Outpatient Encounter Medications as of 06/15/2020  Medication Sig   ACCU-CHEK AVIVA PLUS test strip TEST 3 TIMES DAILY   amLODipine (NORVASC) 10 MG tablet TAKE 1 TABLET BY MOUTH EVERY DAY   aspirin 325 MG EC tablet Take 325 mg by mouth daily.   B-D UF III MINI PEN NEEDLES 31G X 5 MM MISC Use as directed 4x a day   hydrALAZINE (APRESOLINE) 25 MG tablet TAKE 1 TABLET(25 MG) BY MOUTH TWICE DAILY   hydrOXYzine (ATARAX/VISTARIL) 25 MG tablet Take 1 tablet by mouth at bedtime as needed. 1-2 tablets at bedtime prn   insulin lispro (HUMALOG KWIKPEN) 200 UNIT/ML KwikPen Inject under skin 8-10 units 15 min before the 3 main meals   Lancets (ACCU-CHEK SOFT TOUCH) lancets Check blood sugars 3 times daily E11.65   latanoprost (XALATAN) 0.005 % ophthalmic solution Place 1 drop into both eyes at bedtime.    metoprolol succinate (TOPROL-XL) 25 MG 24 hr tablet Take 25 mg by mouth daily.   Multiple Vitamin (MULTIVITAMIN) tablet Take 1 tablet by mouth daily.   rosuvastatin (CRESTOR) 10 MG tablet TAKE 1 TABLET BY MOUTH EVERY DAY   Semaglutide, 1 MG/DOSE, (OZEMPIC, 1 MG/DOSE,) 2 MG/1.5ML SOPN Inject 1 mg into the skin once a week.   telmisartan-hydrochlorothiazide (MICARDIS HCT) 80-25 MG tablet TAKE 1 TABLET BY MOUTH EVERY DAY   TRESIBA FLEXTOUCH 200 UNIT/ML FlexTouch Pen INJECT 28 UNITS INTO THE SKIN AT BEDTIME   No facility-administered encounter medications on file as of 06/15/2020.   Recent Relevant Labs: Lab Results  Component Value Date/Time   HGBA1C 10.4 (H) 03/15/2020 11:49 AM   HGBA1C 8.8 (A) 11/27/2019 12:46 PM   HGBA1C 7.7 (H) 09/11/2019  09:02 AM   MICROALBUR 150 09/04/2019 02:19 PM   MICROALBUR 323.9 (H) 10/09/2018 09:05 AM    Kidney Function Lab Results  Component Value Date/Time   CREATININE 2.55 (H) 03/15/2020 11:49 AM   CREATININE 1.95 (H) 10/14/2019 10:11 AM   CREATININE 1.95 (H) 10/09/2018 09:05 AM   GFRNONAA 17 (L) 03/15/2020 11:49 AM   GFRNONAA 24 (L) 10/09/2018 09:05 AM   GFRAA 20 (L) 03/15/2020 11:49 AM   GFRAA 27 (L) 10/09/2018 09:05 AM     Current antihyperglycemic regimen:   Glipizide XL '5mg'$  daily with breakfast  Humalog 8-10 units 15 min before the 3 main meals.    Ozempic '1mg'$  weekly  Tresiba 28 units at bedtime   What recent interventions/DTPs have been made to improve glycemic control:  o Patient reports she is checking her blood sugars once in the morning and once at bedtime. o Patient stated she is taking her medications as directed.    Have there been any recent hospitalizations or ED visits since last visit with CPP? No    Patient denies hypoglycemic symptoms, including Pale, Sweaty, Shaky, Hungry, Nervous/irritable and Vision changes    Patient denies hyperglycemic symptoms, including blurry vision, excessive thirst, fatigue, polyuria and weakness    How often are you checking your blood sugar? twice daily, in the morning before eating or drinking and at bedtime    What are your blood sugars ranging?  o Fasting: 133 am o Before meals: none  o After meals: none o Bedtime: none to give at this time.   During the week, how often does your blood glucose drop below 70? Never    Are you checking your feet daily/regularly? Patient states she is checking her feet daily  Adherence Review: Is the patient currently on a STATIN medication? Yes Is the patient currently on ACE/ARB medication? No Does the patient have >5 day gap between last estimated fill dates? Yes   Star Rating Drugs: Rosuvastatin- #90 Tablets, Last Filled 04/19/2020.  The patient stated she just sent in  refill requests to [facility] today for Metoprolol Succinate 25 MG and Hydralazine 25 MG.  The patient confirmed a CCM follow-up appointment with [name], CPP on 07/15/2020 at 9:00 AM.  [name], CPP Notified.  Raynelle Highland, [place] Pharmacist Assistant [phone] CCM Total Time:28 minutes

## 2020-06-28 DIAGNOSIS — N184 Chronic kidney disease, stage 4 (severe): Secondary | ICD-10-CM | POA: Diagnosis not present

## 2020-06-28 DIAGNOSIS — E1122 Type 2 diabetes mellitus with diabetic chronic kidney disease: Secondary | ICD-10-CM | POA: Diagnosis not present

## 2020-06-28 DIAGNOSIS — D631 Anemia in chronic kidney disease: Secondary | ICD-10-CM | POA: Diagnosis not present

## 2020-06-28 DIAGNOSIS — I129 Hypertensive chronic kidney disease with stage 1 through stage 4 chronic kidney disease, or unspecified chronic kidney disease: Secondary | ICD-10-CM | POA: Diagnosis not present

## 2020-06-29 ENCOUNTER — Other Ambulatory Visit: Payer: Self-pay | Admitting: Internal Medicine

## 2020-07-05 NOTE — Progress Notes (Deleted)
Patient ID: [name] [name], female   DOB: [birth year], 80 y.o.   MRN: [id number]   This visit occurred during the SARS-CoV-2 public health emergency.  Safety protocols were in place, including screening questions prior to the visit, additional usage of staff PPE, and extensive cleaning of exam room while observing appropriate contact time as indicated for disinfecting solutions.   HPI: [name] [name] is a 80 y.o.-year-old female, returning for follow-up for DM2, dx in the 1980s, insulin-dependent since ~2010, uncontrolled, with complications (dCHF, Cerebrovascular disease - history of CVA 2002, CKD, DR, PN).  Last visit almost 4 months ago.  Interim history: Patient recently saw nephrology (Dr. Joelyn Oms) and had kidney function checked.  This was low, 20.  It was commented that an SGLT2 would probably be not be indicated due to the low GFR.  At that time, glucose was low, at 62.  Reviewed HbA1c levels: Lab Results  Component Value Date   HGBA1C 10.4 (H) 03/15/2020   HGBA1C 8.8 (A) 11/27/2019   HGBA1C 7.7 (H) 09/11/2019   HGBA1C 8.8 (A) 07/03/2019   HGBA1C 8.9 (H) 02/13/2019   HGBA1C 9.4 (A) 10/09/2018   HGBA1C 9.6 (A) 06/14/2018   HGBA1C 8.4 (A) 02/26/2018  09/10/2017: HbA1c 10.4%  Pt is on a regimen of: >> now off - Tresiba 28 >> 26 >> 20 >> 28 units daily - Ozempic 0.5 mg weekly-added 06/2018 >> increased to 1 mg weekly 06/2019 - Humalog 8 to 10 units before each meal-added 03/2020 In 02/2018: We stopped Onglyza since this was expensive and started glipizide ER low-dose. She was previously also on Levemir 35 units daily. She was previously on Metformin but stopped 10/2019 due to worsening kidney function.  Pt checks her sugars once a day: - am: 59-141, 197-224 >> 80-125 >> 64-190, 216, 330 - 2h after b'fast: n/c >> 269 >> n/c - before lunch: n/c >> ?, 190 >> n/c >> 246 - 2h after lunch: n/c >> 199 >> n/c - before dinner: n/c  - 2h after dinner: n/c - bedtime: n/c >> 98,  151-352 - nighttime: n/c Lowest sugar was 59 >> 80 >> 64; she has hypoglycemia awareness in the 70s. Highest sugar was  441 (missed insulin) ... >> 200 >> 352.  Glucometer: Accu-Chek Aviva  Pt's meals are - eats 2x a day: - Breakfast: oatmeal + bacon - Lunch: veggies and a piece of chicken - small meal - Dinner: chicken, veggies - Snacks: apples, oranges  -+ CKD, last BUN/creatinine:  06/28/2020: Glucose 62, BUN/creatinine 47/2.41, GFR 20, ACR 1358 mg/g Lab Results  Component Value Date   BUN 47 (H) 03/15/2020   BUN 55 (H) 10/14/2019   CREATININE 2.55 (H) 03/15/2020   CREATININE 1.95 (H) 10/14/2019  09/10/2017: 27/1.52, GFR 37 On telmisartan 80.  -+ HL; lipids: Lab Results  Component Value Date   CHOL 122 03/15/2020   HDL 30 (L) 03/15/2020   LDLCALC 60 03/15/2020   TRIG 190 (H) 03/15/2020   CHOLHDL 4.1 03/15/2020  08/14/2017: 151/187/44/70 On Crestor 20.  She is off Vascepa.  - last eye exam was in 02/2020: reportedly + Moderate NPDR OD, + macular edema.  She was taking IO injections.  Her stroke affected her left eye vision.  -She has numbness and tingling in her feet  On ASA 325.  Pt has FH of DM in father.  TFTs reviewed: Lab Results  Component Value Date   TSH 1.410 03/15/2020  06/07/2016: 2.110.  ROS: Constitutional: no weight gain/no  weight loss, no fatigue, no subjective hyperthermia, no subjective hypothermia Eyes: no blurry vision, no xerophthalmia ENT: no sore throat, no nodules palpated in neck, no dysphagia, no odynophagia, no hoarseness Cardiovascular: no CP/no SOB/no palpitations/no leg swelling Respiratory: no cough/no SOB/no wheezing Gastrointestinal: no N/no V/no D/no C/no acid reflux Musculoskeletal: no muscle aches/no joint aches Skin: no rashes, no hair loss Neurological: no tremors/+ numbness/+ tingling/no dizziness  I reviewed pt's medications, allergies, PMH, social hx, family hx, and changes were documented in the history of present  illness. Otherwise, unchanged from my initial visit note.  Past Medical History:  Diagnosis Date  . Diabetes mellitus ([place])   . H/O: CVA (cerebrovascular accident) 2002  . HTN (hypertension)   . Hyperlipidemia   . Pulmonary HTN ([place])    mild by echo   Past Surgical History:  Procedure Laterality Date  . ABDOMINAL HYSTERECTOMY    . CARDIAC CATHETERIZATION  03/17/2003   Normal, patent coronary arteries. Normal LV systolic function  . CARDIOVASCULAR STRESS TEST  08/28/2006   Small sized, moderate ischemiain the Basal Inferoseptal, Basal Inferior, Mid Inferior and Apical Inferior regions  . TRANSTHORACIC ECHOCARDIOGRAM  02/17/2010   EF >55%, moderate-severely thickened septum, mild-moderate mitral annular calcification   Social History   Socioeconomic History  . Marital status: Married    Spouse name: Not on file  . Number of children: Not on file  . Years of education: Not on file  . Highest education level: Not on file  Occupational History  . Occupation: retired  Tobacco Use  . Smoking status: Never Smoker  . Smokeless tobacco: Never Used  Vaping Use  . Vaping Use: Never used  Substance and Sexual Activity  . Alcohol use: No  . Drug use: Never  . Sexual activity: Not Currently  Other Topics Concern  . Not on file  Social History Narrative  . Not on file   Social Determinants of Health   Financial Resource Strain: Low Risk   . Difficulty of Paying Living Expenses: Not hard at all  Food Insecurity: No Food Insecurity  . Worried About Charity fundraiser in the Last Year: Never true  . Ran Out of Food in the Last Year: Never true  Transportation Needs: No Transportation Needs  . Lack of Transportation (Medical): No  . Lack of Transportation (Non-Medical): No  Physical Activity: Inactive  . Days of Exercise per Week: 0 days  . Minutes of Exercise per Session: 0 min  Stress: No Stress Concern Present  . Feeling of Stress : Not at all  Social Connections: Not on  file  Intimate Partner Violence: Not on file   Current Outpatient Medications on File Prior to Visit  Medication Sig Dispense Refill  . ACCU-CHEK AVIVA PLUS test strip TEST 3 TIMES DAILY 100 each 11  . amLODipine (NORVASC) 10 MG tablet TAKE 1 TABLET BY MOUTH EVERY DAY 90 tablet 1  . aspirin 325 MG EC tablet Take 325 mg by mouth daily.    . B-D UF III MINI PEN NEEDLES 31G X 5 MM MISC Use as directed 4x a day 300 each 3  . hydrALAZINE (APRESOLINE) 25 MG tablet TAKE 1 TABLET(25 MG) BY MOUTH TWICE DAILY 180 tablet 1  . hydrOXYzine (ATARAX/VISTARIL) 25 MG tablet Take 1 tablet by mouth at bedtime as needed. 1-2 tablets at bedtime prn    . insulin lispro (HUMALOG KWIKPEN) 200 UNIT/ML KwikPen Inject under skin 8-10 units 15 min before the 3 main meals 9 mL  3  . Lancets (ACCU-CHEK SOFT TOUCH) lancets Check blood sugars 3 times daily E11.65 100 each 12  . latanoprost (XALATAN) 0.005 % ophthalmic solution Place 1 drop into both eyes at bedtime.     . metoprolol succinate (TOPROL-XL) 25 MG 24 hr tablet TAKE 1 TABLET BY MOUTH EVERY DAY 90 tablet 1  . Multiple Vitamin (MULTIVITAMIN) tablet Take 1 tablet by mouth daily.    Marland Kitchen OZEMPIC, 1 MG/DOSE, 4 MG/3ML SOPN INJECT 1 MG UNDER THE SKIN ONE DAY A WEEK 3 mL 0  . rosuvastatin (CRESTOR) 10 MG tablet TAKE 1 TABLET BY MOUTH EVERY DAY 90 tablet 1  . Semaglutide, 1 MG/DOSE, (OZEMPIC, 1 MG/DOSE,) 2 MG/1.5ML SOPN Inject 1 mg into the skin once a week. 6 pen 3  . telmisartan-hydrochlorothiazide (MICARDIS HCT) 80-25 MG tablet TAKE 1 TABLET BY MOUTH EVERY DAY 90 tablet 1  . TRESIBA FLEXTOUCH 200 UNIT/ML FlexTouch Pen INJECT 28 UNITS INTO THE SKIN AT BEDTIME 15 mL 1   No current facility-administered medications on file prior to visit.   Allergies  Allergen Reactions  . Brimonidine Other (See Comments)    Eyes swell and itch  . Dorzolamide     Other reaction(s): Other (See Comments) Red, inflamed eyelid, dermatitis   . Timolol Other (See Comments)    Caused eyes  to swell and itch   Family History  Problem Relation Age of Onset  . Hypertension Mother   . Hypertension Father   . Heart Problems Father   . Hypertension Sister   . Diabetes Sister   . Hypertension Brother   . Diabetes Brother    PE: There were no vitals taken for this visit.  Wt Readings from Last 3 Encounters:  03/30/20 151 lb (68.5 kg)  03/15/20 151 lb 3.2 oz (68.6 kg)  11/27/19 149 lb (67.6 kg)   Constitutional: + Slightly overweight, in NAD Eyes: PERRLA, EOMI, no exophthalmos ENT: moist mucous membranes, no thyromegaly, no cervical lymphadenopathy Cardiovascular: RRR, No MRG Respiratory: CTA B Gastrointestinal: abdomen soft, NT, ND, BS+ Musculoskeletal: no deformities, strength intact in all 4 Skin: moist, warm, no rashes Neurological: no tremor with outstretched hands, DTR normal in all 4  ASSESSMENT: 1. DM2, insulin-dependent, uncontrolled, with long-term complications - dCHF - Cerebrovascular disease, history of CVA 2002 - CKD - DR - PN  2. HL  3.  Overweight  PLAN:  1. Patient with longstanding, uncontrolled, type 2 diabetes, on basal-bolus insulin regimen and also weekly GLP-1 receptor agonist, with still poor control.  At last visit, HbA1c was 10.4%, very high.  At that time, I advised her to not refill glipizide after she ran out and we started Humalog before each meal.  Sugars were closer to goal in the morning but they were quite high later in the day in the 200s and even 300s.  - I suggested to:  Patient Instructions  Please continue: - Ozempic 1 mg weekly - Tresiba 28 units daily in a.m. - Humalog 8-10 units 15 min before meals  Please come back for a follow-up appointment in 3 months.  - we checked her HbA1c: 7%  - advised to check sugars at different times of the day - 3x a day, rotating check times - advised for yearly eye exams >> she is UTD - return to clinic in 3 months  2. HL -Reviewed latest lipid panel from 03/2020: LDL at goal,  HDL low, triglycerides high Lab Results  Component Value Date   CHOL 122 03/15/2020  HDL 30 (L) 03/15/2020   LDLCALC 60 03/15/2020   TRIG 190 (H) 03/15/2020   CHOLHDL 4.1 03/15/2020  -She is now on 20 mg of Crestor daily.  No side effects.  3.  Overweight -We will continue Ozempic which should also help with weight loss - At this visit,***  Philemon Kingdom, MD PhD [facility] Endocrinology

## 2020-07-06 ENCOUNTER — Ambulatory Visit: Payer: Medicare Other | Admitting: Internal Medicine

## 2020-07-15 ENCOUNTER — Telehealth: Payer: Medicare Other

## 2020-07-20 ENCOUNTER — Other Ambulatory Visit: Payer: Self-pay | Admitting: Internal Medicine

## 2020-07-20 DIAGNOSIS — E1122 Type 2 diabetes mellitus with diabetic chronic kidney disease: Secondary | ICD-10-CM

## 2020-07-20 DIAGNOSIS — N182 Chronic kidney disease, stage 2 (mild): Secondary | ICD-10-CM

## 2020-07-26 ENCOUNTER — Telehealth: Payer: Self-pay

## 2020-07-26 NOTE — Chronic Care Management (AMB) (Signed)
Called patient and left voicemail with appointment reminder for 07/27/20 at [place] Pharmacist Assistant [phone]

## 2020-07-27 ENCOUNTER — Ambulatory Visit (INDEPENDENT_AMBULATORY_CARE_PROVIDER_SITE_OTHER): Payer: Medicare Other

## 2020-07-27 DIAGNOSIS — I10 Essential (primary) hypertension: Secondary | ICD-10-CM | POA: Diagnosis not present

## 2020-07-27 DIAGNOSIS — E7800 Pure hypercholesterolemia, unspecified: Secondary | ICD-10-CM

## 2020-07-27 DIAGNOSIS — Z794 Long term (current) use of insulin: Secondary | ICD-10-CM

## 2020-07-27 DIAGNOSIS — E1165 Type 2 diabetes mellitus with hyperglycemia: Secondary | ICD-10-CM | POA: Diagnosis not present

## 2020-07-27 DIAGNOSIS — IMO0002 Reserved for concepts with insufficient information to code with codable children: Secondary | ICD-10-CM

## 2020-07-27 DIAGNOSIS — E1159 Type 2 diabetes mellitus with other circulatory complications: Secondary | ICD-10-CM | POA: Diagnosis not present

## 2020-07-27 DIAGNOSIS — E1169 Type 2 diabetes mellitus with other specified complication: Secondary | ICD-10-CM | POA: Diagnosis not present

## 2020-07-27 DIAGNOSIS — E785 Hyperlipidemia, unspecified: Secondary | ICD-10-CM | POA: Diagnosis not present

## 2020-07-27 NOTE — Progress Notes (Signed)
Chronic Care Management Pharmacy Note  08/03/2020 Name:  [name] [name] MRN:  [id number] DOB:  March 16, 1941  Subjective: [name] [name] is an 80 y.o. year old female who is a primary patient of Glendale Chard, MD.  The CCM team was consulted for assistance with disease management and care coordination needs.  She has two daughters and 10 great grand children. She went to [place] for her birthday.   Engaged with patient by telephone for follow up visit in response to provider referral for pharmacy case management and/or care coordination services.   Consent to Services:  The patient was given information about Chronic Care Management services, agreed to services, and gave verbal consent prior to initiation of services.  Please see initial visit note for detailed documentation.   Patient Care Team: Glendale Chard, MD as PCP - General (Internal Medicine) Mayford Knife, [facility] (Pharmacist)  Recent office visits: 03/15/2020 PCP OV   Recent consult visits: 03/30/2020 Endocrinology  Appointment - patient to finish taking Glipizide 25m and startt taking Humalog 8-10 units 15 minutes prior to meal    Hospital visits: None in previous 6 months  Objective:  Lab Results  Component Value Date   CREATININE 2.55 (H) 03/15/2020   BUN 47 (H) 03/15/2020   GFRNONAA 17 (L) 03/15/2020   GFRAA 20 (L) 03/15/2020   NA 140 03/15/2020   K 3.7 03/15/2020   CALCIUM 9.5 03/15/2020   CO2 24 03/15/2020   GLUCOSE 262 (H) 03/15/2020    Lab Results  Component Value Date/Time   HGBA1C 10.4 (H) 03/15/2020 11:49 AM   HGBA1C 8.8 (A) 11/27/2019 12:46 PM   HGBA1C 7.7 (H) 09/11/2019 09:02 AM   MICROALBUR 150 09/04/2019 02:19 PM   MICROALBUR 323.9 (H) 10/09/2018 09:05 AM    Last diabetic Eye exam:  Lab Results  Component Value Date/Time   HMDIABEYEEXA Retinopathy (A) 05/23/2018 12:00 AM    Last diabetic Foot exam: No results found for: HMDIABFOOTEX   Lab Results  Component Value Date    CHOL 122 03/15/2020   HDL 30 (L) 03/15/2020   LDLCALC 60 03/15/2020   TRIG 190 (H) 03/15/2020   CHOLHDL 4.1 03/15/2020    Hepatic Function Latest Ref Rng & Units 03/15/2020 09/11/2019 10/09/2018  Total Protein 6.0 - 8.5 g/dL 7.5 7.0 6.8  Albumin 3.6 - 4.6 g/dL 4.3 4.1 -  AST 0 - 40 IU/L _0 ALT 0 - 32 IU/L _1 Alk Phosphatase 44 - 121 IU/L 120 106 -  Total Bilirubin 0.0 - 1.2 mg/dL <0.2 0.2 0.4    Lab Results  Component Value Date/Time   TSH 1.410 03/15/2020 11:49 AM    CBC Latest Ref Rng & Units 09/11/2019 02/12/2019 02/24/2017  WBC 3.4 - 10.8 x10E3/uL 11.9(H) 9.9 -  Hemoglobin 11.1 - 15.9 g/dL 10.0(L) 10.8(L) 11.2(L)  Hematocrit 34.0 - 46.6 % 31.5(L) 32.3(L) 33.0(L)  Platelets 150 - 450 x10E3/uL 448 382 -    No results found for: VD25OH  Clinical ASCVD: Yes  The ASCVD Risk score (Mikey BussingDC Jr., et al., 2013) failed to calculate for the following reasons:   The 2013 ASCVD risk score is only valid for ages 429to 752   Depression screen PHQ 2/9 10/14/2019 09/04/2019 08/13/2018  Decreased Interest 0 0 0  Down, Depressed, Hopeless 0 0 0  PHQ - 2 Score 0 0 0  Altered sleeping - 0 0  Tired, decreased energy - 0 0  Change in  appetite - 0 0  Feeling bad or failure about yourself  - 0 0  Trouble concentrating - 0 0  Moving slowly or fidgety/restless - 0 0  Suicidal thoughts - 0 0  PHQ-9 Score - 0 0  Difficult doing work/chores - Not difficult at all -     Social History   Tobacco Use  Smoking Status Never Smoker  Smokeless Tobacco Never Used   BP Readings from Last 3 Encounters:  03/30/20 120/70  03/15/20 140/72  11/27/19 (!) 148/60   Pulse Readings from Last 3 Encounters:  03/15/20 71  11/27/19 80  10/28/19 70   Wt Readings from Last 3 Encounters:  03/30/20 151 lb (68.5 kg)  03/15/20 151 lb 3.2 oz (68.6 kg)  11/27/19 149 lb (67.6 kg)   BMI Readings from Last 3 Encounters:  03/30/20 30.50 kg/m  03/15/20 29.73 kg/m  11/27/19 27.97 kg/m     Assessment/Interventions: Review of patient past medical history, allergies, medications, health status, including review of consultants reports, laboratory and other test data, was performed as part of comprehensive evaluation and provision of chronic care management services.   SDOH:  (Social Determinants of Health) assessments and interventions performed: No  SDOH Screenings   Alcohol Screen: Not on file  Depression (PHQ2-9): Low Risk   . PHQ-2 Score: 0  Financial Resource Strain: Low Risk   . Difficulty of Paying Living Expenses: Not hard at all  Food Insecurity: No Food Insecurity  . Worried About Charity fundraiser in the Last Year: Never true  . Ran Out of Food in the Last Year: Never true  Housing: Not on file  Physical Activity: Inactive  . Days of Exercise per Week: 0 days  . Minutes of Exercise per Session: 0 min  Social Connections: Not on file  Stress: No Stress Concern Present  . Feeling of Stress : Not at all  Tobacco Use: Low Risk   . Smoking Tobacco Use: Never Smoker  . Smokeless Tobacco Use: Never Used  Transportation Needs: No Transportation Needs  . Lack of Transportation (Medical): No  . Lack of Transportation (Non-Medical): No    CCM Care Plan  Allergies  Allergen Reactions  . Brimonidine Other (See Comments)    Eyes swell and itch  . Dorzolamide     Other reaction(s): Other (See Comments) Red, inflamed eyelid, dermatitis   . Timolol Other (See Comments)    Caused eyes to swell and itch    Medications Reviewed Today    Reviewed by Mayford Knife, RPH (Pharmacist) on 07/27/20 at 1023  Med List Status: <None>  Medication Order Taking? Sig Documenting Provider Last Dose Status Informant  ACCU-CHEK AVIVA PLUS test strip [id number]  TEST THREE TIMES DAILY AS DIRECTED Glendale Chard, MD  Active   amLODipine (NORVASC) 10 MG tablet [id number] Yes TAKE 1 TABLET BY MOUTH EVERY DAY Glendale Chard, MD Taking Active   aspirin 325 MG EC tablet [id number]  Yes Take 325 mg by mouth daily. [provider] Taking Active Family Member  B-D UF III MINI PEN NEEDLES 31G X 5 MM MISC [id number] Yes Use as directed 4x a day Philemon Kingdom, MD Taking Active   hydrALAZINE (APRESOLINE) 25 MG tablet [id number] Yes TAKE 1 TABLET(25 MG) BY MOUTH TWICE DAILY Glendale Chard, MD Taking Active   hydrOXYzine (ATARAX/VISTARIL) 25 MG tablet [id number] Yes Take 1 tablet by mouth at bedtime as needed. 1-2 tablets at bedtime prn [provider] Taking Active Family Member  insulin lispro (  HUMALOG KWIKPEN) 200 UNIT/ML KwikPen [id number] No Inject under skin 8-10 units 15 min before the 3 main meals  Patient not taking: Reported on 07/27/2020   Philemon Kingdom, MD Not Taking Active   Lancets (ACCU-CHEK SOFT TOUCH) lancets [id number]  Check blood sugars 3 times daily E11.65 Glendale Chard, MD  Active Family Member  latanoprost (XALATAN) 0.005 % ophthalmic solution [id number]  Place 1 drop into both eyes at bedtime.  [provider]  Active   metoprolol succinate (TOPROL-XL) 25 MG 24 hr tablet [id number] Yes TAKE 1 TABLET BY MOUTH EVERY DAY Glendale Chard, MD Taking Active   Multiple Vitamin (MULTIVITAMIN) tablet [id number]  Take 1 tablet by mouth daily. [provider]  Active Family Member  rosuvastatin (CRESTOR) 10 MG tablet [id number] Yes TAKE 1 TABLET BY MOUTH EVERY DAY Glendale Chard, MD Taking Active   Semaglutide, 1 MG/DOSE, (OZEMPIC, 1 MG/DOSE,) 2 MG/1.5ML SOPN [id number] Yes Inject 1 mg into the skin once a week. Philemon Kingdom, MD Taking Active   telmisartan-hydrochlorothiazide (MICARDIS HCT) 80-25 MG tablet [id number] Yes TAKE 1 TABLET BY MOUTH EVERY DAY Glendale Chard, MD Taking Active   TRESIBA FLEXTOUCH 200 UNIT/ML FlexTouch Pen [id number] Yes INJECT 28 UNITS INTO THE SKIN AT BEDTIME Glendale Chard, MD Taking Active           Patient Active Problem List   Diagnosis Date Noted  . Hyperlipidemia associated with type 2 diabetes  mellitus ([place]) 10/09/2018  . Overweight (BMI 25.0-29.9) 10/09/2018  . Left ventricular diastolic dysfunction, NYHA class 1 02/06/2013  . HTN (hypertension)   . Uncontrolled type 2 diabetes mellitus with circulatory disorder, with long-term current use of insulin ([place])     Immunization History  Administered Date(s) Administered  . Fluad Quad(high Dose 65+) 12/12/2018  . Influenza, High Dose Seasonal PF 01/08/2018, 01/02/2019  . Influenza-Unspecified 04/10/2017  . Moderna Sars-Covid-2 Vaccination 05/22/2019, 06/20/2019, 01/30/2020  . Pneumococcal Conjugate-13 01/07/2014  . Pneumococcal Polysaccharide-23 03/31/2014  . Pneumococcal-Unspecified 03/31/2014  . Zoster 12/31/2007    Conditions to be addressed/monitored:  Hypertension, Hyperlipidemia and Diabetes  There are no care plans that you recently modified to display for this patient.    Medication Assistance: None required.  Patient affirms current coverage meets needs.  Patient's preferred pharmacy is:  [facility] [phone] - [place], [place] AT [place] [address] [place] [address] Phone: [phone] Fax: [phone]  Uses pill box? Yes Pt endorses 90% compliance  We discussed: Benefits of medication synchronization, packaging and delivery as well as enhanced pharmacist oversight with [employer]. Patient decided to: Continue current medication management strategy  Care Plan and Follow Up Patient Decision:  Patient agrees to Care Plan and Follow-up.  Plan: Telephone follow up appointment with care management team member scheduled for:  09/02/2020 and The patient has been provided with contact information for the care management team and has been advised to call with any health related questions or concerns.   [name], PharmD Clinical Pharmacist [facility] [phone]

## 2020-08-03 NOTE — Patient Instructions (Signed)
Visit Information It was great speaking with you today!  Please let me know if you have any questions about our visit.  Goals Addressed            This Visit's Progress   . Manage My Medicine       Timeframe:  Long-Range Goal Priority:  High Start Date:    07/27/2020                         Expected End Date:                       Follow Up Date 09/02/2020   - call for medicine refill 2 or 3 days before it runs out - call if I am sick and can't take my medicine - keep a list of all the medicines I take; vitamins and herbals too - use a pillbox to sort medicine - use an alarm clock or phone to remind me to take my medicine    Why is this important?   . These steps will help you keep on track with your medicines.          Patient Care Plan: CCM Pharmacy Care Plan    Problem Identified: HTN, HLD, DM II   Priority: High    Long-Range Goal: Disease Management   Start Date: 07/27/2020  This Visit's Progress: On track  Priority: High  Note:    Current Barriers:  . Unable to independently monitor therapeutic efficacy . Unable to achieve control of Diabetes    Pharmacist Clinical Goal(s):  Marland Kitchen Patient will achieve adherence to monitoring guidelines and medication adherence to achieve therapeutic efficacy . achieve control of Diabetes as evidenced by A1c <8.0 through collaboration with PharmD and provider.    Interventions: . 1:1 collaboration with Glendale Chard, MD regarding development and update of comprehensive plan of care as evidenced by provider attestation and co-signature . Inter-disciplinary care team collaboration (see longitudinal plan of care) . Comprehensive medication review performed; medication list updated in electronic medical record  Hypertension (BP goal <140/90) -Controlled -Current treatment: . Metoprolol Succinate 25 mg - taking 1 tablet by mouth daily  . Amlodipine 10 mg tablet at bedtime  . Telmisartan- Hydrocholorothiazide - first thing in  the morning prior to breakfast  -Current home readings: 149/83, usually its less < 140/70, 136/80,  -Current exercise habits: patient walks and goes to the center and does exercise. She is walking 15 minutes outside and then she has an Art therapist at the [facility] that does exercise with her.  -Denies hypotensive/hypertensive symptoms -Educated on BP goals and benefits of medications for prevention of heart attack, stroke and kidney damage; Daily salt intake goal < 2300 mg; Importance of home blood pressure monitoring; Proper BP monitoring technique;  -Counseled to monitor BP at home once per day and, document, and provide log at future appointments -Recommended to continue current medication  Hyperlipidemia: (LDL goal < 70) -Controlled -Current treatment: . Aspirin 325 mg tablet once per day  . Rosuvastatin 10  mg tablet daily -Current dietary patterns: limiting fried and fatty food -Current exercise habits: exercising at least 15 minutes once a day and twice a week going to the [facility] to exercise.  -Endocrinologist increased patient medication to Rosuvastatin 20 mg tablet daily. Patient reports that she is taking 10 mg tablet daily.  -Educated on Benefits of statin for ASCVD risk reduction; Importance of limiting foods high in  cholesterol; Exercise goal of 150 minutes per week; -Recommended to continue current medication  Diabetes (A1c goal <8%) -Uncontrolled -Current medications: . Tresiba 28 units at bedtime  . Ozempic 1 mg into the skin once a week on Friday mornings  . Glipizide ER 2.5 mg taking 2 tablets daily  o Written by Dr. Renne Crigler . Humalog 8-10 units 15 minute before meals  o Written by Dr. Renne Crigler  -Medications previously tried: -Current home glucose readings . fasting glucose: 164, 98 - other readings are upstairs and at this time she does not feel like getting them    . post prandial glucose: patient reports sometimes up and sometimes down   -Denies hypoglycemic/hyperglycemic symptoms -Current meal patterns:  . Breakfast: oatmeal or egg with a piece of bacon  . lunch: salad, or whatever is left over the day before  . dinner: green beans, potato salad, roast beef  . drinks: half sweet and half un-sweet,. Sometimes juice if she feels her blood sugar is low-this was not recent , other times she only drinks a half bottle of the juice  -Current exercise: please see hyperlipidemia for details -Educated on A1c and blood sugar goals; Complications of diabetes including kidney damage, retinal damage, and cardiovascular disease; Exercise goal of 150 minutes per week; Proper insulin injection technique;  -Explained to patient that medication regimen was changed -Patient to stop taking Glipizide once she has completed all of the pills including the 30 in the pill bottle, and the weekly reminder system  -Then explained that patient is to start taking Humalog after she is done with the bottle of Glipizide.  -Patient stated that she will be seeing Dr. Letta Median soon within the next couple of weeks.  -Counseled to check feet daily and get yearly eye exams -Counseled on diet and exercise extensively Recommended to continue current medication  Health Maintenance -Vaccine gaps:   -COVID -19 = received 4 th booster [facility] on 07/20/2020              -Zostavax - first dose in September 2021, eligible for second dose    -Patient reports receiving vaccine in September at [facility] in [place] [address].       -Checked [facility] no documentation in registry.  Recommended patient receive second dose and ask for this information to be placed in the registry and bring copies of the paperwork of getting the shingles vaccine.  -Current therapy:  . Centrum Silver Multivitamin  - once a day  -Patient is satisfied with current therapy and denies issues -Recommended to continue current medication   Patient Goals/Self-Care Activities . Patient will:  -  take medications as prescribed check glucose at least twice per day and, document, and provide at future appointments  Follow Up Plan: Telephone follow up appointment with care management team member scheduled for: 09/02/2020 The patient has been provided with contact information for the care management team and has been advised to call with any health related questions or concerns.       Patient agreed to services and verbal consent obtained.   The patient verbalized understanding of instructions, educational materials, and care plan provided today and agreed to receive a mailed copy of patient instructions, educational materials, and care plan.   [name], PharmD Clinical Pharmacist [facility] [phone]

## 2020-08-17 ENCOUNTER — Other Ambulatory Visit: Payer: Self-pay | Admitting: Internal Medicine

## 2020-09-01 ENCOUNTER — Telehealth: Payer: Self-pay

## 2020-09-01 NOTE — Chronic Care Management (AMB) (Signed)
  Called patient for an appointment reminder with [name] on 09-02-2020 at 9:45. Patient stated she is not available and would like to reschedule to 10-26-2020 at 9:45. Sent scheduling a message to reschedule.   [place] Pharmacist Assistant [phone]'

## 2020-09-02 ENCOUNTER — Telehealth: Payer: Self-pay

## 2020-09-08 ENCOUNTER — Telehealth: Payer: Self-pay

## 2020-09-08 NOTE — Chronic Care Management (AMB) (Addendum)
Chronic Care Management Pharmacy Assistant   Name: [name] [name]  MRN: [id number] DOB: 1940-07-14   Reason for Encounter: Adherance call/ Diabetes  Recent office visits:  None  Recent consult visits:  None  Hospital visits:  None in previous 6 months  Medications: Outpatient Encounter Medications as of 09/08/2020  Medication Sig   ACCU-CHEK AVIVA PLUS test strip TEST THREE TIMES DAILY AS DIRECTED   amLODipine (NORVASC) 10 MG tablet TAKE 1 TABLET BY MOUTH EVERY DAY   aspirin 325 MG EC tablet Take 325 mg by mouth daily.   B-D UF III MINI PEN NEEDLES 31G X 5 MM MISC Use as directed 4x a day   hydrALAZINE (APRESOLINE) 25 MG tablet TAKE 1 TABLET(25 MG) BY MOUTH TWICE DAILY   hydrOXYzine (ATARAX/VISTARIL) 25 MG tablet Take 1 tablet by mouth at bedtime as needed. 1-2 tablets at bedtime prn   insulin lispro (HUMALOG KWIKPEN) 200 UNIT/ML KwikPen Inject under skin 8-10 units 15 min before the 3 main meals (Patient not taking: Reported on 07/27/2020)   Lancets (ACCU-CHEK SOFT TOUCH) lancets Check blood sugars 3 times daily E11.65   latanoprost (XALATAN) 0.005 % ophthalmic solution Place 1 drop into both eyes at bedtime.    metoprolol succinate (TOPROL-XL) 25 MG 24 hr tablet TAKE 1 TABLET BY MOUTH EVERY DAY   Multiple Vitamin (MULTIVITAMIN) tablet Take 1 tablet by mouth daily.   rosuvastatin (CRESTOR) 10 MG tablet TAKE 1 TABLET BY MOUTH EVERY DAY   Semaglutide, 1 MG/DOSE, (OZEMPIC, 1 MG/DOSE,) 2 MG/1.5ML SOPN Inject 1 mg into the skin once a week.   telmisartan-hydrochlorothiazide (MICARDIS HCT) 80-25 MG tablet TAKE 1 TABLET BY MOUTH EVERY DAY   TRESIBA FLEXTOUCH 200 UNIT/ML FlexTouch Pen INJECT 28 UNITS INTO THE SKIN AT BEDTIME   No facility-administered encounter medications on file as of 09/08/2020.   Recent Relevant Labs: Lab Results  Component Value Date/Time   HGBA1C 10.4 (H) 03/15/2020 11:49 AM   HGBA1C 8.8 (A) 11/27/2019 12:46 PM   HGBA1C 7.7 (H) 09/11/2019 09:02 AM    MICROALBUR 150 09/04/2019 02:19 PM   MICROALBUR 323.9 (H) 10/09/2018 09:05 AM    Kidney Function Lab Results  Component Value Date/Time   CREATININE 2.55 (H) 03/15/2020 11:49 AM   CREATININE 1.95 (H) 10/14/2019 10:11 AM   CREATININE 1.95 (H) 10/09/2018 09:05 AM   GFRNONAA 17 (L) 03/15/2020 11:49 AM   GFRNONAA 24 (L) 10/09/2018 09:05 AM   GFRAA 20 (L) 03/15/2020 11:49 AM   GFRAA 27 (L) 10/09/2018 09:05 AM    Current antihyperglycemic regimen:  Tresiba 28 units at bedtime  Ozempic 1 mg into the skin once a week on Friday mornings  Glipizide ER 2.5 mg taking 2 tablets daily  Humalog 8-10 units 15 minute before meals   What recent interventions/DTPs have been made to improve glycemic control:  Patient states she is taking medications as directed and checking blood sugars twice daily.  Have there been any recent hospitalizations or ED visits since last visit with CPP? No   Patient denies hypoglycemic symptoms  Patient denies hyperglycemic symptoms  How often are you checking your blood sugar? twice daily   What are your blood sugars ranging? Patient stated she wasn't around a log with readings but will provide them at her visit on 09-09-2020 with Dr. Baird Cancer. Fasting: None Before meals: None After meals: None Bedtime: None  During the week, how often does your blood glucose drop below 70? Never   Are you checking your  feet daily/regularly? Patient stated daily  Adherence Review: Is the patient currently on a STATIN medication? Yes Is the patient currently on ACE/ARB medication? Yes Does the patient have >5 day gap between last estimated fill dates? No   Star Rating Drugs: Glipizide 2.5 MG- Last filled 08-18-2020 90 DS [facility] Rosuvastatin 10 MG- Last filled 08-17-2020 90 DS [facility] Telmisartan-HCTZ 80-25 MG- Last filled 08-23-2020 90 DS [facility] Ozempic 1 MG- Last filled 05-21-2020 28 DS [place] Clinical Pharmacist  Assistant [phone]

## 2020-09-09 ENCOUNTER — Ambulatory Visit (INDEPENDENT_AMBULATORY_CARE_PROVIDER_SITE_OTHER): Payer: Medicare Other | Admitting: Internal Medicine

## 2020-09-09 ENCOUNTER — Encounter: Payer: Self-pay | Admitting: Internal Medicine

## 2020-09-09 ENCOUNTER — Ambulatory Visit (INDEPENDENT_AMBULATORY_CARE_PROVIDER_SITE_OTHER): Payer: Medicare Other

## 2020-09-09 ENCOUNTER — Other Ambulatory Visit: Payer: Self-pay

## 2020-09-09 VITALS — BP 116/62 | HR 70 | Temp 97.9°F | Wt 157.2 lb

## 2020-09-09 VITALS — BP 116/62 | HR 70 | Temp 97.9°F | Ht 60.2 in | Wt 157.0 lb

## 2020-09-09 DIAGNOSIS — Z794 Long term (current) use of insulin: Secondary | ICD-10-CM

## 2020-09-09 DIAGNOSIS — E1165 Type 2 diabetes mellitus with hyperglycemia: Secondary | ICD-10-CM | POA: Diagnosis not present

## 2020-09-09 DIAGNOSIS — Z0000 Encounter for general adult medical examination without abnormal findings: Secondary | ICD-10-CM | POA: Diagnosis not present

## 2020-09-09 DIAGNOSIS — IMO0002 Reserved for concepts with insufficient information to code with codable children: Secondary | ICD-10-CM

## 2020-09-09 DIAGNOSIS — I129 Hypertensive chronic kidney disease with stage 1 through stage 4 chronic kidney disease, or unspecified chronic kidney disease: Secondary | ICD-10-CM

## 2020-09-09 DIAGNOSIS — R202 Paresthesia of skin: Secondary | ICD-10-CM

## 2020-09-09 DIAGNOSIS — Z23 Encounter for immunization: Secondary | ICD-10-CM

## 2020-09-09 DIAGNOSIS — I509 Heart failure, unspecified: Secondary | ICD-10-CM

## 2020-09-09 DIAGNOSIS — Z6830 Body mass index (BMI) 30.0-30.9, adult: Secondary | ICD-10-CM

## 2020-09-09 DIAGNOSIS — E1159 Type 2 diabetes mellitus with other circulatory complications: Secondary | ICD-10-CM

## 2020-09-09 DIAGNOSIS — E6609 Other obesity due to excess calories: Secondary | ICD-10-CM

## 2020-09-09 DIAGNOSIS — H02402 Unspecified ptosis of left eyelid: Secondary | ICD-10-CM

## 2020-09-09 DIAGNOSIS — I10 Essential (primary) hypertension: Secondary | ICD-10-CM

## 2020-09-09 DIAGNOSIS — E1122 Type 2 diabetes mellitus with diabetic chronic kidney disease: Secondary | ICD-10-CM

## 2020-09-09 DIAGNOSIS — E11319 Type 2 diabetes mellitus with unspecified diabetic retinopathy without macular edema: Secondary | ICD-10-CM

## 2020-09-09 DIAGNOSIS — E1142 Type 2 diabetes mellitus with diabetic polyneuropathy: Secondary | ICD-10-CM

## 2020-09-09 DIAGNOSIS — N1832 Chronic kidney disease, stage 3b: Secondary | ICD-10-CM | POA: Diagnosis not present

## 2020-09-09 DIAGNOSIS — I272 Pulmonary hypertension, unspecified: Secondary | ICD-10-CM

## 2020-09-09 LAB — POCT UA - MICROALBUMIN
Creatinine, POC: 300 mg/dL
Microalbumin Ur, POC: 150 mg/L

## 2020-09-09 LAB — POCT URINALYSIS DIPSTICK
Bilirubin, UA: NEGATIVE
Blood, UA: NEGATIVE
Glucose, UA: NEGATIVE
Ketones, UA: NEGATIVE
Leukocytes, UA: NEGATIVE
Nitrite, UA: NEGATIVE
Protein, UA: POSITIVE — AB
Spec Grav, UA: >=1.03 — AB (ref 1.010–1.025)
Urobilinogen, UA: 0.2 E.U./dL
pH, UA: 5.5 (ref 5.0–8.0)

## 2020-09-09 MED ORDER — BOOSTRIX 5-2.5-18.5 LF-MCG/0.5 IM SUSP: 0.5000 mL | Freq: Once | INTRAMUSCULAR | 0 refills | Status: AC

## 2020-09-09 NOTE — Patient Instructions (Addendum)
Hypertension, Adult Hypertension is another name for high blood pressure. High blood pressure forces your heart to work harder to pump blood. This can cause problems over time. There are two numbers in a blood pressure reading. There is a top number (systolic) over a bottom number (diastolic). It is best to have a blood pressure that is below 120/80. Healthy choices can help lower your blood pressure, or you may need medicine to help lower it. What are the causes? The cause of this condition is not known. Some conditions may be related to high blood pressure. What increases the risk?  Smoking.  Having type 2 diabetes mellitus, high cholesterol, or both.  Not getting enough exercise or physical activity.  Being overweight.  Having too much fat, sugar, calories, or salt (sodium) in your diet.  Drinking too much alcohol.  Having long-term (chronic) kidney disease.  Having a family history of high blood pressure.  Age. Risk increases with age.  Race. You may be at higher risk if you are African American.  Gender. Men are at higher risk than women before age 45. After age 65, women are at higher risk than men.  Having obstructive sleep apnea.  Stress. What are the signs or symptoms?  High blood pressure may not cause symptoms. Very high blood pressure (hypertensive crisis) may cause: ? Headache. ? Feelings of worry or nervousness (anxiety). ? Shortness of breath. ? Nosebleed. ? A feeling of being sick to your stomach (nausea). ? Throwing up (vomiting). ? Changes in how you see. ? Very bad chest pain. ? Seizures. How is this treated?  This condition is treated by making healthy lifestyle changes, such as: ? Eating healthy foods. ? Exercising more. ? Drinking less alcohol.  Your health care provider may prescribe medicine if lifestyle changes are not enough to get your blood pressure under control, and if: ? Your top number is above 130. ? Your bottom number is above  80.  Your personal target blood pressure may vary. Follow these instructions at home: Eating and drinking  If told, follow the DASH eating plan. To follow this plan: ? Fill one half of your plate at each meal with fruits and vegetables. ? Fill one fourth of your plate at each meal with whole grains. Whole grains include whole-wheat pasta, brown rice, and whole-grain bread. ? Eat or drink low-fat dairy products, such as skim milk or low-fat yogurt. ? Fill one fourth of your plate at each meal with low-fat (lean) proteins. Low-fat proteins include fish, chicken without skin, eggs, beans, and tofu. ? Avoid fatty meat, cured and processed meat, or chicken with skin. ? Avoid pre-made or processed food.  Eat less than 1,500 mg of salt each day.  Do not drink alcohol if: ? Your doctor tells you not to drink. ? You are pregnant, may be pregnant, or are planning to become pregnant.  If you drink alcohol: ? Limit how much you use to:  0-1 drink a day for women.  0-2 drinks a day for men. ? Be aware of how much alcohol is in your drink. In the [place] one drink equals one 12 oz bottle of beer (355 mL), one 5 oz glass of wine (148 mL), or one 1 oz glass of hard liquor (44 mL).   Lifestyle  Work with your doctor to stay at a healthy weight or to lose weight. Ask your doctor what the best weight is for you.  Get at least 30 minutes of exercise most   days of the week. This may include walking, swimming, or biking.  Get at least 30 minutes of exercise that strengthens your muscles (resistance exercise) at least 3 days a week. This may include lifting weights or doing Pilates.  Do not use any products that contain nicotine or tobacco, such as cigarettes, e-cigarettes, and chewing tobacco. If you need help quitting, ask your doctor.  Check your blood pressure at home as told by your doctor.  Keep all follow-up visits as told by your doctor. This is important.   Medicines  Take over-the-counter  and prescription medicines only as told by your doctor. Follow directions carefully.  Do not skip doses of blood pressure medicine. The medicine does not work as well if you skip doses. Skipping doses also puts you at risk for problems.  Ask your doctor about side effects or reactions to medicines that you should watch for. Contact a doctor if you:  Think you are having a reaction to the medicine you are taking.  Have headaches that keep coming back (recurring).  Feel dizzy.  Have swelling in your ankles.  Have trouble with your vision. Get help right away if you:  Get a very bad headache.  Start to feel mixed up (confused).  Feel weak or numb.  Feel faint.  Have very bad pain in your: ? Chest. ? Belly (abdomen).  Throw up more than once.  Have trouble breathing. Summary  Hypertension is another name for high blood pressure.  High blood pressure forces your heart to work harder to pump blood.  For most people, a normal blood pressure is less than 120/80.  Making healthy choices can help lower blood pressure. If your blood pressure does not get lower with healthy choices, you may need to take medicine. This information is not intended to replace advice given to you by your health care provider. Make sure you discuss any questions you have with your health care provider. Document Revised: 12/05/2017 Document Reviewed: 12/05/2017 Elsevier Patient Education  2021 [place] Syndrome  Carpal tunnel syndrome is a condition that causes pain, weakness, and numbness in your hand and arm. Numbness is when you cannot feel an area in your body. The carpal tunnel is a narrow area that is on the palm side of your wrist. Repeated wrist motion or certain diseases may cause swelling in the tunnel. This swelling can pinch the main nerve in the wrist. This nerve is called the median nerve. What are the causes? This condition may be caused by: Moving your hand and  wrist over and over again while doing a task. Injury to the wrist. Arthritis. A sac of fluid (cyst) or abnormal growth (tumor) in the carpal tunnel. Fluid buildup during pregnancy. Use of tools that vibrate. Sometimes the cause is not known. What increases the risk? The following factors may make you more likely to have this condition: Having a job that makes you do these things: Move your hand over and over again. Work with tools that vibrate, such as drills or sanders. Being a woman. Having diabetes, obesity, thyroid problems, or kidney failure. What are the signs or symptoms? Symptoms of this condition include: A tingling feeling in your fingers. Tingling or loss of feeling in your hand. Pain in your entire arm. This pain may get worse when you bend your wrist and elbow for a long time. Pain in your wrist that goes up your arm to your shoulder. Pain that goes down into your palm  or fingers. Weakness in your hands. You may find it hard to grab and hold items. You may feel worse at night. How is this treated? This condition may be treated with: Lifestyle changes. You will be asked to stop or change the activity that caused your problem. Doing exercises and activities that make bones, muscles, and tendons stronger (physical therapy). Learning how to use your hand again (occupational therapy). Medicines for pain and swelling. You may have injections in your wrist. A wrist splint or brace. Surgery. Follow these instructions at home: If you have a splint or brace: Wear the splint or brace as told by your doctor. Take it off only as told by your doctor. Loosen the splint if your fingers: Tingle. Become numb. Turn cold and blue. Keep the splint or brace clean. If the splint or brace is not waterproof: Do not let it get wet. Cover it with a watertight covering when you take a bath or a shower. Managing pain, stiffness, and swelling If told, put ice on the painful area: If you  have a removable splint or brace, remove it as told by your doctor. Put ice in a plastic bag. Place a towel between your skin and the bag. Leave the ice on for 20 minutes, 2-3 times per day. Do not fall asleep with the cold pack on your skin. Take off the ice if your skin turns bright red. This is very important. If you cannot feel pain, heat, or cold, you have a greater risk of damage to the area. Move your fingers often to reduce stiffness and swelling.   General instructions Take over-the-counter and prescription medicines only as told by your doctor. Rest your wrist from any activity that may cause pain. If needed, talk with your boss at work about changes that can help your wrist heal. Do exercises as told by your doctor, physical therapist, or occupational therapist. Keep all follow-up visits. Contact a doctor if: You have new symptoms. Medicine does not help your pain. Your symptoms get worse. Get help right away if: You have very bad numbness or tingling in your wrist or hand. Summary Carpal tunnel syndrome is a condition that causes pain in your hand and arm. It is often caused by repeated wrist motions. Lifestyle changes and medicines are used to treat this problem. Surgery may help in very bad cases. Follow your doctor's instructions about wearing a splint, resting your wrist, keeping follow-up visits, and calling for help. This information is not intended to replace advice given to you by your health care provider. Make sure you discuss any questions you have with your health care provider. Document Revised: 08/07/2019 Document Reviewed: 08/07/2019 Elsevier Patient Education  [place].

## 2020-09-09 NOTE — Addendum Note (Signed)
Addended by: Glenna Durand E on: 09/09/2020 12:46 PM   Modules accepted: Orders

## 2020-09-09 NOTE — Progress Notes (Signed)
This visit occurred during the SARS-CoV-2 public health emergency.  Safety protocols were in place, including screening questions prior to the visit, additional usage of staff PPE, and extensive cleaning of exam room while observing appropriate contact time as indicated for disinfecting solutions.  Subjective:   [name] [name] is a 80 y.o. female who presents for Medicare Annual (Subsequent) preventive examination.  Review of Systems     Cardiac Risk Factors include: advanced age (>75mn, >>25women);diabetes mellitus;dyslipidemia;hypertension;obesity (BMI >30kg/m2);sedentary lifestyle     Objective:    Today's Vitals   09/09/20 1051  BP: 116/62  Pulse: 70  Temp: 97.9 F (36.6 C)  TempSrc: Oral  SpO2: 97%  Weight: 157 lb (71.2 kg)  Height: 5' 0.2" (1.529 m)   Body mass index is 30.46 kg/m.  Advanced Directives 09/09/2020 09/04/2019 02/12/2019 08/13/2018 07/02/2015 06/29/2015 05/12/2015  Does Patient Have a Medical Advance Directive? Yes No No Yes No No No  Type of [place]amed[place] will - - [place] will - - -  Copy of [place]? No - copy requested - - No - copy requested - - -  Would patient like information on creating a medical advance directive? - No - Patient declined - - No - patient declined information No - patient declined information No - patient declined information    Current Medications (verified) Outpatient Encounter Medications as of 09/09/2020  Medication Sig  . ACCU-CHEK AVIVA PLUS test strip TEST THREE TIMES DAILY AS DIRECTED  . amLODipine (NORVASC) 10 MG tablet TAKE 1 TABLET BY MOUTH EVERY DAY  . aspirin 325 MG EC tablet Take 325 mg by mouth daily.  . B-D UF III MINI PEN NEEDLES 31G X 5 MM MISC Use as directed 4x a day  . glipiZIDE (GLUCOTROL XL) 2.5 MG 24 hr tablet Take 5 mg by mouth every morning.  . hydrALAZINE (APRESOLINE) 25 MG tablet TAKE 1 TABLET(25 MG) BY MOUTH TWICE  DAILY  . hydrOXYzine (ATARAX/VISTARIL) 25 MG tablet Take 1 tablet by mouth at bedtime as needed. 1-2 tablets at bedtime prn  . insulin lispro (HUMALOG KWIKPEN) 200 UNIT/ML KwikPen Inject under skin 8-10 units 15 min before the 3 main meals  . Lancets (ACCU-CHEK SOFT TOUCH) lancets Check blood sugars 3 times daily E11.65  . latanoprost (XALATAN) 0.005 % ophthalmic solution Place 1 drop into both eyes at bedtime.   .Marland Kitchenloratadine (CLARITIN) 10 MG tablet Take 10 mg by mouth daily.  . metoprolol succinate (TOPROL-XL) 25 MG 24 hr tablet TAKE 1 TABLET BY MOUTH EVERY DAY  . Multiple Vitamin (MULTIVITAMIN) tablet Take 1 tablet by mouth daily.  . rosuvastatin (CRESTOR) 10 MG tablet TAKE 1 TABLET BY MOUTH EVERY DAY  . Semaglutide, 1 MG/DOSE, (OZEMPIC, 1 MG/DOSE,) 2 MG/1.5ML SOPN Inject 1 mg into the skin once a week.  . telmisartan-hydrochlorothiazide (MICARDIS HCT) 80-25 MG tablet TAKE 1 TABLET BY MOUTH EVERY DAY  . TRESIBA FLEXTOUCH 200 UNIT/ML FlexTouch Pen INJECT 28 UNITS INTO THE SKIN AT BEDTIME   No facility-administered encounter medications on file as of 09/09/2020.    Allergies (verified) Brimonidine, Dorzolamide, and Timolol   History: Past Medical History:  Diagnosis Date  . Diabetes mellitus ([place]   . H/O: CVA (cerebrovascular accident) 2002  . HTN (hypertension)   . Hyperlipidemia   . Pulmonary HTN ([place]    mild by echo   Past Surgical History:  Procedure Laterality Date  . ABDOMINAL HYSTERECTOMY    .  CARDIAC CATHETERIZATION  03/17/2003   Normal, patent coronary arteries. Normal LV systolic function  . CARDIOVASCULAR STRESS TEST  08/28/2006   Small sized, moderate ischemiain the Basal Inferoseptal, Basal Inferior, Mid Inferior and Apical Inferior regions  . TRANSTHORACIC ECHOCARDIOGRAM  02/17/2010   EF >55%, moderate-severely thickened septum, mild-moderate mitral annular calcification   Family History  Problem Relation Age of Onset  . Hypertension Mother   . Hypertension  Father   . Heart Problems Father   . Hypertension Sister   . Diabetes Sister   . Hypertension Brother   . Diabetes Brother    Social History   Socioeconomic History  . Marital status: Married    Spouse name: Not on file  . Number of children: Not on file  . Years of education: Not on file  . Highest education level: Not on file  Occupational History  . Occupation: retired  Tobacco Use  . Smoking status: Never Smoker  . Smokeless tobacco: Never Used  Vaping Use  . Vaping Use: Never used  Substance and Sexual Activity  . Alcohol use: No  . Drug use: Never  . Sexual activity: Not Currently  Other Topics Concern  . Not on file  Social History Narrative  . Not on file   Social Determinants of Health   Financial Resource Strain: Low Risk   . Difficulty of Paying Living Expenses: Not hard at all  Food Insecurity: No Food Insecurity  . Worried About Charity fundraiser in the Last Year: Never true  . Ran Out of Food in the Last Year: Never true  Transportation Needs: No Transportation Needs  . Lack of Transportation (Medical): No  . Lack of Transportation (Non-Medical): No  Physical Activity: Inactive  . Days of Exercise per Week: 0 days  . Minutes of Exercise per Session: 0 min  Stress: No Stress Concern Present  . Feeling of Stress : Not at all  Social Connections: Not on file    Tobacco Counseling Counseling given: Not Answered   Clinical Intake:  Pre-visit preparation completed: Yes  Pain : No/denies pain     Nutritional Status: BMI > 30  Obese Nutritional Risks: None Diabetes: Yes  How often do you need to have someone help you when you read instructions, pamphlets, or other written materials from your doctor or pharmacy?: 1 - Never What is the last grade level you completed in school?: 10th grade  Diabetic? Yes Nutrition Risk Assessment:  Has the patient had any N/V/D within the last 2 months?  No  Does the patient have any non-healing wounds?  No   Has the patient had any unintentional weight loss or weight gain?  No   Diabetes:  Is the patient diabetic?  Yes  If diabetic, was a CBG obtained today?  No  Did the patient bring in their glucometer from home?  No  How often do you monitor your CBG's? Twice daily.   Financial Strains and Diabetes Management:  Are you having any financial strains with the device, your supplies or your medication? No .  Does the patient want to be seen by Chronic Care Management for management of their diabetes?  No  Would the patient like to be referred to a Nutritionist or for Diabetic Management?  No   Diabetic Exams:  Diabetic Eye Exam: Overdue for diabetic eye exam. Pt has been advised about the importance in completing this exam. Patient advised to call and schedule an eye exam. Diabetic Foot Exam: Overdue,  Pt has been advised about the importance in completing this exam. Pt is scheduled for diabetic foot exam on next appointment.   Interpreter Needed?: No  Information entered by :: NAllen LPN   Activities of Daily Living In your present state of health, do you have any difficulty performing the following activities: 09/09/2020  Hearing? N  Vision? Y  Comment can't see out of left eye  Difficulty concentrating or making decisions? N  Walking or climbing stairs? N  Dressing or bathing? N  Doing errands, shopping? N  Preparing Food and eating ? N  Using the Toilet? N  In the past six months, have you accidently leaked urine? N  Do you have problems with loss of bowel control? N  Managing your Medications? N  Managing your Finances? N  Housekeeping or managing your Housekeeping? N  Some recent data might be hidden    Patient Care Team: Gl[place]ale Chard, MD as PCP - General (Internal Medicine) Mayford Knife, [facility] (Pharmacist)  Indicate any recent Medical Services you may have received from other than [facility] providers in the past year (date may be approximate).     Assessment:    This is a routine wellness examination for [place].  Hearing/Vision screen  Hearing Screening   '125Hz'$  '250Hz'$  '500Hz'$  '1000Hz'$  '2000Hz'$  '3000Hz'$  '4000Hz'$  '6000Hz'$  '8000Hz'$   Right ear:           Left ear:           Vision Screening Comments: Regular eye exams, Dr. Cordelia Pen  Dietary issues and exercise activities discussed: Current Exercise Habits: The patient does not participate in regular exercise at present  Goals Addressed            This Visit's Progress   . Patient Stated       09/09/2020, wants to weigh 142 pounds      Depression Screen PHQ 2/9 Scores 09/09/2020 10/14/2019 09/04/2019 08/13/2018 08/13/2018 05/21/2018 04/16/2018  PHQ - 2 Score 0 0 0 0 0 0 0  PHQ- 9 Score - - 0 0 - - -    Fall Risk Fall Risk  09/09/2020 10/14/2019 09/04/2019 08/13/2018 08/13/2018  Falls in the past year? 0 0 0 0 0  Number falls in past yr: - 0 - - -  Injury with Fall? - 0 - - -  Risk for fall due to : Medication side effect - Medication side effect Medication side effect -  Follow up Falls evaluation completed;Education provided;Falls prevention discussed - Falls evaluation completed;Education provided;Falls prevention discussed Falls prevention discussed -    FALL RISK PREVENTION PERTAINING TO THE HOME:  Any stairs in or around the home? Yes  If so, are there any without handrails? No  Home free of loose throw rugs in walkways, pet beds, electrical cords, etc? Yes  Adequate lighting in your home to reduce risk of falls? Yes   ASSISTIVE DEVICES UTILIZED TO PREVENT FALLS:  Life alert? No  Use of a cane, walker or w/c? No  Grab bars in the bathroom? Yes  Shower chair or bench in shower? Yes  Elevated toilet seat or a handicapped toilet? Yes   TIMED UP AND GO:  Was the test performed? No .     Gait slow and steady without use of assistive device  Cognitive Function:     6CIT Screen 09/09/2020 09/04/2019 08/13/2018  What Year? 0 points 0 points 0 points  What month? 0 points 0 points 0 points  What time? 0 points 0  points  0 points  Count back from 20 0 points 0 points 0 points  Months in reverse 0 points 0 points 0 points  Repeat phrase 8 points 6 points 0 points  Total Score 8 6 0    Immunizations Immunization History  Administered Date(s) Administered  . Fluad Quad(high Dose 65+) 12/12/2018  . Influenza, High Dose Seasonal PF 01/08/2018, 01/02/2019  . Influenza-Unspecified 04/10/2017  . Moderna Sars-Covid-2 Vaccination 05/22/2019, 06/20/2019, 01/30/2020  . Pneumococcal Conjugate-13 01/07/2014  . Pneumococcal Polysaccharide-23 03/31/2014  . Pneumococcal-Unspecified 03/31/2014  . Zoster, Live 12/31/2007    TDAP status: Due, Education has been provided regarding the importance of this vaccine. Advised may receive this vaccine at local pharmacy or Health Dept. Aware to provide a copy of the vaccination record if obtained from local pharmacy or Health Dept. Verbalized acceptance and understanding.  Flu Vaccine status: Up to date  Pneumococcal vaccine status: Up to date  Covid-19 vaccine status: Completed vaccines  Qualifies for Shingles Vaccine? Yes   Zostavax completed Yes   Shingrix Completed?: Yes  Screening Tests Health Maintenance  Topic Date Due  . TETANUS/TDAP  Never done  . Zoster Vaccines- Shingrix (1 of 2) Never done  . OPHTHALMOLOGY EXAM  05/24/2019  . FOOT EXAM  09/03/2020  . HEMOGLOBIN A1C  09/13/2020  . INFLUENZA VACCINE  11/08/2020  . DEXA SCAN  Completed  . COVID-19 Vaccine  Completed  . PNA vac Low Risk Adult  Completed  . HPV VACCINES  Aged Out    Health Maintenance  Health Maintenance Due  Topic Date Due  . TETANUS/TDAP  Never done  . Zoster Vaccines- Shingrix (1 of 2) Never done  . OPHTHALMOLOGY EXAM  05/24/2019  . FOOT EXAM  09/03/2020    Colorectal cancer screening: No longer required.   Mammogram status: Completed 11/04/2019. Repeat every year  Bone Density status: Completed 09/08/2016.   Lung Cancer Screening: (Low Dose CT Chest recommended if Age  103-80 years, 30 pack-year currently smoking OR have quit w/in 15years.) does not qualify.   Lung Cancer Screening Referral: no  Additional Screening:  Hepatitis C Screening: does not qualify;   Vision Screening: Recommended annual ophthalmology exams for early detection of glaucoma and other disorders of the eye. Is the patient up to date with their annual eye exam?  No  Who is the provider or what is the name of the office in which the patient attends annual eye exams? Dr. Cordelia Pen If pt is not established with a provider, would they like to be referred to a provider to establish care? No .   Dental Screening: Recommended annual dental exams for proper oral hygiene  Community Resource Referral / Chronic Care Management: CRR required this visit?  No   CCM required this visit?  No      Plan:     I have personally reviewed and noted the following in the patient's chart:   . Medical and social history . Use of alcohol, tobacco or illicit drugs  . Current medications and supplements including opioid prescriptions.  . Functional ability and status . Nutritional status . Physical activity . Advanced directives . List of other physicians . Hospitalizations, surgeries, and ER visits in previous 12 months . Vitals . Screenings to include cognitive, depression, and falls . Referrals and appointments  In addition, I have reviewed and discussed with patient certain preventive protocols, quality metrics, and best practice recommendations. A written personalized care plan for preventive services as well as general preventive health recommendations  were provided to patient.     Kellie Simmering, LPN   [phone]   Nurse Notes:

## 2020-09-09 NOTE — Progress Notes (Signed)
I,[name] [name],acting as a Education administrator for [name] Greenland, MD.,have documented all relevant documentation on the behalf of [name] Greenland, MD,as directed by  [name] Greenland, MD while in the presence of [name] Greenland, MD.  This visit occurred during the SARS-CoV-2 public health emergency.  Safety protocols were in place, including screening questions prior to the visit, additional usage of staff PPE, and extensive cleaning of exam room while observing appropriate contact time as indicated for disinfecting solutions.  Subjective:     Patient ID: [name] [name] , female    DOB: 1940-11-15 , 80 y.o.   MRN: [id number]   Chief Complaint  Patient presents with   Diabetes   Hypertension    HPI  She presents today for a diabetes and blood pressure f/u.  She reports compliance with meds. She is now followed by Endocrine for her diabetes mgmt. She is also scheduled for AWV with [facility] Advisor.   Diabetes She presents for her follow-up diabetic visit. She has type 2 diabetes mellitus. Pertinent negatives for diabetes include no blurred vision and no chest pain. Risk factors for coronary artery disease include diabetes mellitus, dyslipidemia, hypertension, post-menopausal and sedentary lifestyle. Current diabetic treatment includes insulin injections and oral agent (monotherapy). She participates in exercise intermittently. An ACE inhibitor/angiotensin II receptor blocker is being taken. Eye exam is current.  Hypertension This is a chronic problem. The current episode started more than 1 year ago. The problem has been gradually improving since onset. The problem is controlled. Pertinent negatives include no blurred vision, chest pain, palpitations or shortness of breath. Risk factors for coronary artery disease include diabetes mellitus, dyslipidemia, post-menopausal state and sedentary lifestyle. The current treatment provides moderate improvement. Compliance problems include exercise.  Hypertensive  end-organ damage includes kidney disease.    Past Medical History:  Diagnosis Date   Diabetes mellitus ([place])    H/O: CVA (cerebrovascular accident) 2002   HTN (hypertension)    Hyperlipidemia    Pulmonary HTN (HCC)    mild by echo     Family History  Problem Relation Age of Onset   Hypertension Mother    Hypertension Father    Heart Problems Father    Hypertension Sister    Diabetes Sister    Hypertension Brother    Diabetes Brother      Current Outpatient Medications:    ACCU-CHEK AVIVA PLUS test strip, TEST THREE TIMES DAILY AS DIRECTED, Disp: 100 strip, Rfl: 11   amLODipine (NORVASC) 10 MG tablet, TAKE 1 TABLET BY MOUTH EVERY DAY, Disp: 90 tablet, Rfl: 1   aspirin 325 MG EC tablet, Take 325 mg by mouth daily., Disp: , Rfl:    B-D UF III MINI PEN NEEDLES 31G X 5 MM MISC, Use as directed 4x a day, Disp: 300 each, Rfl: 3   glipiZIDE (GLUCOTROL XL) 2.5 MG 24 hr tablet, Take 5 mg by mouth every morning., Disp: , Rfl:    hydrALAZINE (APRESOLINE) 25 MG tablet, TAKE 1 TABLET(25 MG) BY MOUTH TWICE DAILY, Disp: 180 tablet, Rfl: 1   hydrOXYzine (ATARAX/VISTARIL) 25 MG tablet, Take 1 tablet by mouth at bedtime as needed. 1-2 tablets at bedtime prn, Disp: , Rfl:    insulin lispro (HUMALOG KWIKPEN) 200 UNIT/ML KwikPen, Inject under skin 8-10 units 15 min before the 3 main meals, Disp: 9 mL, Rfl: 3   Lancets (ACCU-CHEK SOFT TOUCH) lancets, Check blood sugars 3 times daily E11.65, Disp: 100 each, Rfl: 12   latanoprost (XALATAN) 0.005 % ophthalmic  solution, Place 1 drop into both eyes at bedtime. , Disp: , Rfl:    loratadine (CLARITIN) 10 MG tablet, Take 10 mg by mouth daily., Disp: , Rfl:    metoprolol succinate (TOPROL-XL) 25 MG 24 hr tablet, TAKE 1 TABLET BY MOUTH EVERY DAY, Disp: 90 tablet, Rfl: 1   Multiple Vitamin (MULTIVITAMIN) tablet, Take 1 tablet by mouth daily., Disp: , Rfl:    rosuvastatin (CRESTOR) 10 MG tablet, TAKE 1 TABLET BY MOUTH EVERY DAY, Disp: 90 tablet, Rfl: 1    Semaglutide, 1 MG/DOSE, (OZEMPIC, 1 MG/DOSE,) 2 MG/1.5ML SOPN, Inject 1 mg into the skin once a week., Disp: 6 pen, Rfl: 3   telmisartan-hydrochlorothiazide (MICARDIS HCT) 80-25 MG tablet, TAKE 1 TABLET BY MOUTH EVERY DAY, Disp: 90 tablet, Rfl: 1   TRESIBA FLEXTOUCH 200 UNIT/ML FlexTouch Pen, INJECT 28 UNITS INTO THE SKIN AT BEDTIME, Disp: 15 mL, Rfl: 1   Allergies  Allergen Reactions   Brimonidine Other (See Comments)    Eyes swell and itch   Dorzolamide     Other reaction(s): Other (See Comments) Red, inflamed eyelid, dermatitis    Timolol Other (See Comments)    Caused eyes to swell and itch     Review of Systems  Constitutional: Negative.   Eyes:  Negative for blurred vision.  Respiratory: Negative.  Negative for shortness of breath.   Cardiovascular: Negative.  Negative for chest pain and palpitations.  Gastrointestinal: Negative.   Neurological:  Positive for numbness.       She c/o tingling in right hand. Not sure when this started, maybe 3 months ago. Sx are worse in her thumb. She is r handed. Denies RUE weakness.   Psychiatric/Behavioral: Negative.    All other systems reviewed and are negative.   Today's Vitals   09/09/20 1112  BP: 116/62  Pulse: 70  Temp: 97.9 F (36.6 C)  TempSrc: Oral  Weight: 157 lb 3.2 oz (71.3 kg)   Body mass index is 30.5 kg/m.  Wt Readings from Last 3 Encounters:  09/09/20 157 lb 3.2 oz (71.3 kg)  09/09/20 157 lb (71.2 kg)  03/30/20 151 lb (68.5 kg)   BP Readings from Last 3 Encounters:  09/09/20 116/62  09/09/20 116/62  03/30/20 120/70   Objective:  Physical Exam Vitals and nursing note reviewed.  Constitutional:      Appearance: Normal appearance.  HENT:     Head: Normocephalic and atraumatic.  Cardiovascular:     Rate and Rhythm: Normal rate and regular rhythm.     Heart sounds: Normal heart sounds.  Pulmonary:     Effort: Pulmonary effort is normal.     Breath sounds: Normal breath sounds.  Skin:    General: Skin is  warm.  Neurological:     General: No focal deficit present.     Mental Status: She is alert.  Psychiatric:        Mood and Affect: Mood normal.        Behavior: Behavior normal.        Assessment And Plan:     1. Uncontrolled type 2 diabetes mellitus with circulatory disorder, with long-term current use of insulin (HCC) Comments: Chronic, I will check an a1c today. I will be sure to forward to her endocrinologist for review.  - Hemoglobin A1c - CBC no Diff - CMP14+EGFR  2. Diabetic polyneuropathy associated with type 2 diabetes mellitus (HCC) Comments: Chronic, this could be cause for LUE numbness/tingling. Will consider EMG/NCS.   3. Hypertensive nephropathy  Comments: Chronic, well controlled. ADvised to follow low sodium diet.   4. Stage 3b chronic kidney disease ([place]) Comments: Chronic, now followed by Renal.   5. Right hand paresthesia Comments: She was given wrist splint to use nightly. If no improvement in her sx, I will consider EMG/NCS evaluation.   6. Ptosis of eyelid, left Comments: States her Ophthalmologist is working her up for this. Denies visual changes.   7. Class 1 obesity due to excess calories with serious comorbidity and body mass index (BMI) of 30.0 to 30.9 in adult Comments: Her BMI is acceptable for her demographic. Encouraged to walk 20-30 minutes four to five days per week.   Patient was given opportunity to ask questions. Patient verbalized understanding of the plan and was able to repeat key elements of the plan. All questions were answered to their satisfaction.   I, [name] Greenland, MD, have reviewed all documentation for this visit. The documentation on 09/20/20 for the exam, diagnosis, procedures, and orders are all accurate and complete.   IF YOU HAVE BEEN REFERRED TO A SPECIALIST, IT MAY TAKE 1-2 WEEKS TO SCHEDULE/PROCESS THE REFERRAL. IF YOU HAVE NOT HEARD FROM US/SPECIALIST IN TWO WEEKS, PLEASE GIVE [place] A CALL AT [phone].   THE  PATIENT IS ENCOURAGED TO PRACTICE SOCIAL DISTANCING DUE TO THE COVID-19 PANDEMIC.

## 2020-09-09 NOTE — Patient Instructions (Signed)
[name] [name] , Thank you for taking time to come for your Medicare Wellness Visit. I appreciate your ongoing commitment to your health goals. Please review the following plan we discussed and let me know if I can assist you in the future.   Screening recommendations/referrals: Colonoscopy: not required Mammogram: completed 11/04/2019 Bone Density: completed 09/08/2016 Recommended yearly ophthalmology/optometry visit for glaucoma screening and checkup Recommended yearly dental visit for hygiene and checkup  Vaccinations: Influenza vaccine: due 11/08/2020 Pneumococcal vaccine: completed 03/31/2014 Tdap vaccine: sent to pharmacy Shingles vaccine: completed   Covid-19: 07/20/2020, 01/30/2020, 06/20/2019, 05/22/2019  Advanced directives: Please bring a copy of your POA (Power of Attorney) and/or Living Will to your next appointment.   Conditions/risks identified: none  Next appointment: Follow up in one year for your annual wellness visit    Preventive Care 65 Years and Older, Female Preventive care refers to lifestyle choices and visits with your health care provider that can promote health and wellness. What does preventive care include?  A yearly physical exam. This is also called an annual well check.  Dental exams once or twice a year.  Routine eye exams. Ask your health care provider how often you should have your eyes checked.  Personal lifestyle choices, including:  Daily care of your teeth and gums.  Regular physical activity.  Eating a healthy diet.  Avoiding tobacco and drug use.  Limiting alcohol use.  Practicing safe sex.  Taking low-dose aspirin every day.  Taking vitamin and mineral supplements as recommended by your health care provider. What happens during an annual well check? The services and screenings done by your health care provider during your annual well check will depend on your age, overall health, lifestyle risk factors, and family history of  disease. Counseling  Your health care provider may ask you questions about your:  Alcohol use.  Tobacco use.  Drug use.  Emotional well-being.  Home and relationship well-being.  Sexual activity.  Eating habits.  History of falls.  Memory and ability to understand (cognition).  Work and work Statistician.  Reproductive health. Screening  You may have the following tests or measurements:  Height, weight, and BMI.  Blood pressure.  Lipid and cholesterol levels. These may be checked every 5 years, or more frequently if you are over 33 years old.  Skin check.  Lung cancer screening. You may have this screening every year starting at age 93 if you have a 30-pack-year history of smoking and currently smoke or have quit within the past 15 years.  Fecal occult blood test (FOBT) of the stool. You may have this test every year starting at age 76.  Flexible sigmoidoscopy or colonoscopy. You may have a sigmoidoscopy every 5 years or a colonoscopy every 10 years starting at age 41.  Hepatitis C blood test.  Hepatitis B blood test.  Sexually transmitted disease (STD) testing.  Diabetes screening. This is done by checking your blood sugar (glucose) after you have not eaten for a while (fasting). You may have this done every 1-3 years.  Bone density scan. This is done to screen for osteoporosis. You may have this done starting at age 64.  Mammogram. This may be done every 1-2 years. Talk to your health care provider about how often you should have regular mammograms. Talk with your health care provider about your test results, treatment options, and if necessary, the need for more tests. Vaccines  Your health care provider may recommend certain vaccines, such as:  Influenza vaccine. This  is recommended every year.  Tetanus, diphtheria, and acellular pertussis (Tdap, Td) vaccine. You may need a Td booster every 10 years.  Zoster vaccine. You may need this after age  83.  Pneumococcal 13-valent conjugate (PCV13) vaccine. One dose is recommended after age 49.  Pneumococcal polysaccharide (PPSV23) vaccine. One dose is recommended after age 21. Talk to your health care provider about which screenings and vaccines you need and how often you need them. This information is not intended to replace advice given to you by your health care provider. Make sure you discuss any questions you have with your health care provider. Document Released: 04/23/2015 Document Revised: 12/15/2015 Document Reviewed: 01/26/2015 Elsevier Interactive Patient Education  2017 [place] Prevention in the Home Falls can cause injuries. They can happen to people of all ages. There are many things you can do to make your home safe and to help prevent falls. What can I do on the outside of my home?  Regularly fix the edges of walkways and driveways and fix any cracks.  Remove anything that might make you trip as you walk through a door, such as a raised step or threshold.  Trim any bushes or trees on the path to your home.  Use bright outdoor lighting.  Clear any walking paths of anything that might make someone trip, such as rocks or tools.  Regularly check to see if handrails are loose or broken. Make sure that both sides of any steps have handrails.  Any raised decks and porches should have guardrails on the edges.  Have any leaves, snow, or ice cleared regularly.  Use sand or salt on walking paths during winter.  Clean up any spills in your garage right away. This includes oil or grease spills. What can I do in the bathroom?  Use night lights.  Install grab bars by the toilet and in the tub and shower. Do not use towel bars as grab bars.  Use non-skid mats or decals in the tub or shower.  If you need to sit down in the shower, use a plastic, non-slip stool.  Keep the floor dry. Clean up any water that spills on the floor as soon as it happens.  Remove  soap buildup in the tub or shower regularly.  Attach bath mats securely with double-sided non-slip rug tape.  Do not have throw rugs and other things on the floor that can make you trip. What can I do in the bedroom?  Use night lights.  Make sure that you have a light by your bed that is easy to reach.  Do not use any sheets or blankets that are too big for your bed. They should not hang down onto the floor.  Have a firm chair that has side arms. You can use this for support while you get dressed.  Do not have throw rugs and other things on the floor that can make you trip. What can I do in the kitchen?  Clean up any spills right away.  Avoid walking on wet floors.  Keep items that you use a lot in easy-to-reach places.  If you need to reach something above you, use a strong step stool that has a grab bar.  Keep electrical cords out of the way.  Do not use floor polish or wax that makes floors slippery. If you must use wax, use non-skid floor wax.  Do not have throw rugs and other things on the floor that can make you  trip. What can I do with my stairs?  Do not leave any items on the stairs.  Make sure that there are handrails on both sides of the stairs and use them. Fix handrails that are broken or loose. Make sure that handrails are as long as the stairways.  Check any carpeting to make sure that it is firmly attached to the stairs. Fix any carpet that is loose or worn.  Avoid having throw rugs at the top or bottom of the stairs. If you do have throw rugs, attach them to the floor with carpet tape.  Make sure that you have a light switch at the top of the stairs and the bottom of the stairs. If you do not have them, ask someone to add them for you. What else can I do to help prevent falls?  Wear shoes that:  Do not have high heels.  Have rubber bottoms.  Are comfortable and fit you well.  Are closed at the toe. Do not wear sandals.  If you use a  stepladder:  Make sure that it is fully opened. Do not climb a closed stepladder.  Make sure that both sides of the stepladder are locked into place.  Ask someone to hold it for you, if possible.  Clearly mark and make sure that you can see:  Any grab bars or handrails.  First and last steps.  Where the edge of each step is.  Use tools that help you move around (mobility aids) if they are needed. These include:  Canes.  Walkers.  Scooters.  Crutches.  Turn on the lights when you go into a dark area. Replace any light bulbs as soon as they burn out.  Set up your furniture so you have a clear path. Avoid moving your furniture around.  If any of your floors are uneven, fix them.  If there are any pets around you, be aware of where they are.  Review your medicines with your doctor. Some medicines can make you feel dizzy. This can increase your chance of falling. Ask your doctor what other things that you can do to help prevent falls. This information is not intended to replace advice given to you by your health care provider. Make sure you discuss any questions you have with your health care provider. Document Released: 01/21/2009 Document Revised: 09/02/2015 Document Reviewed: 05/01/2014 Elsevier Interactive Patient Education  2017 [employer].

## 2020-09-10 LAB — HEMOGLOBIN A1C
Est. average glucose Bld gHb Est-mCnc: 240 mg/dL
Hgb A1c MFr Bld: 10 % — ABNORMAL HIGH (ref 4.8–5.6)

## 2020-09-10 LAB — CMP14+EGFR
ALT: 15 IU/L (ref 0–32)
AST: 19 IU/L (ref 0–40)
Albumin/Globulin Ratio: 1.6 (ref 1.2–2.2)
Albumin: 4.5 g/dL (ref 3.6–4.6)
Alkaline Phosphatase: 118 IU/L (ref 44–121)
BUN/Creatinine Ratio: 19 (ref 12–28)
BUN: 46 mg/dL — ABNORMAL HIGH (ref 8–27)
Bilirubin Total: <0.2 mg/dL (ref 0.0–1.2)
CO2: 24 mmol/L (ref 20–29)
Calcium: 10.4 mg/dL — ABNORMAL HIGH (ref 8.7–10.3)
Chloride: 107 mmol/L — ABNORMAL HIGH (ref 96–106)
Creatinine, Ser: 2.37 mg/dL — ABNORMAL HIGH (ref 0.57–1.00)
Globulin, Total: 2.8 g/dL (ref 1.5–4.5)
Glucose: 96 mg/dL (ref 65–99)
Potassium: 4.8 mmol/L (ref 3.5–5.2)
Sodium: 147 mmol/L — ABNORMAL HIGH (ref 134–144)
Total Protein: 7.3 g/dL (ref 6.0–8.5)
eGFR: 20 mL/min/{1.73_m2} — ABNORMAL LOW (ref >59)

## 2020-09-10 LAB — CBC
Hematocrit: 32.9 % — ABNORMAL LOW (ref 34.0–46.6)
Hemoglobin: 10.7 g/dL — ABNORMAL LOW (ref 11.1–15.9)
MCH: 28.1 pg (ref 26.6–33.0)
MCHC: 32.5 g/dL (ref 31.5–35.7)
MCV: 86 fL (ref 79–97)
Platelets: 388 10*3/uL (ref 150–450)
RBC: 3.81 x10E6/uL (ref 3.77–5.28)
RDW: 13.4 % (ref 11.7–15.4)
WBC: 10.2 10*3/uL (ref 3.4–10.8)

## 2020-09-16 DIAGNOSIS — Z794 Long term (current) use of insulin: Secondary | ICD-10-CM | POA: Diagnosis not present

## 2020-09-16 DIAGNOSIS — E11311 Type 2 diabetes mellitus with unspecified diabetic retinopathy with macular edema: Secondary | ICD-10-CM | POA: Diagnosis not present

## 2020-09-16 DIAGNOSIS — E113311 Type 2 diabetes mellitus with moderate nonproliferative diabetic retinopathy with macular edema, right eye: Secondary | ICD-10-CM | POA: Diagnosis not present

## 2020-09-16 DIAGNOSIS — Z7984 Long term (current) use of oral hypoglycemic drugs: Secondary | ICD-10-CM | POA: Diagnosis not present

## 2020-09-16 DIAGNOSIS — H2102 Hyphema, left eye: Secondary | ICD-10-CM | POA: Diagnosis not present

## 2020-09-16 DIAGNOSIS — H401133 Primary open-angle glaucoma, bilateral, severe stage: Secondary | ICD-10-CM | POA: Diagnosis not present

## 2020-09-20 DIAGNOSIS — I1310 Hypertensive heart and chronic kidney disease without heart failure, with stage 1 through stage 4 chronic kidney disease, or unspecified chronic kidney disease: Secondary | ICD-10-CM

## 2020-09-20 DIAGNOSIS — E1142 Type 2 diabetes mellitus with diabetic polyneuropathy: Secondary | ICD-10-CM | POA: Insufficient documentation

## 2020-09-20 DIAGNOSIS — Z6830 Body mass index (BMI) 30.0-30.9, adult: Secondary | ICD-10-CM | POA: Insufficient documentation

## 2020-09-20 DIAGNOSIS — E11319 Type 2 diabetes mellitus with unspecified diabetic retinopathy without macular edema: Secondary | ICD-10-CM

## 2020-09-20 DIAGNOSIS — N184 Chronic kidney disease, stage 4 (severe): Secondary | ICD-10-CM | POA: Diagnosis not present

## 2020-09-20 DIAGNOSIS — H02402 Unspecified ptosis of left eyelid: Secondary | ICD-10-CM | POA: Insufficient documentation

## 2020-09-20 DIAGNOSIS — R202 Paresthesia of skin: Secondary | ICD-10-CM | POA: Insufficient documentation

## 2020-09-20 DIAGNOSIS — I129 Hypertensive chronic kidney disease with stage 1 through stage 4 chronic kidney disease, or unspecified chronic kidney disease: Secondary | ICD-10-CM | POA: Insufficient documentation

## 2020-09-20 DIAGNOSIS — E6609 Other obesity due to excess calories: Secondary | ICD-10-CM | POA: Insufficient documentation

## 2020-09-20 HISTORY — DX: Hypertensive heart and chronic kidney disease without heart failure, with stage 1 through stage 4 chronic kidney disease, or unspecified chronic kidney disease: I13.10

## 2020-09-20 HISTORY — DX: Type 2 diabetes mellitus with unspecified diabetic retinopathy without macular edema: E11.319

## 2020-10-21 ENCOUNTER — Ambulatory Visit (INDEPENDENT_AMBULATORY_CARE_PROVIDER_SITE_OTHER): Payer: Medicare Other | Admitting: Nurse Practitioner

## 2020-10-21 ENCOUNTER — Other Ambulatory Visit: Payer: Self-pay

## 2020-10-21 VITALS — Temp 98.0°F | Ht 60.8 in | Wt 158.6 lb

## 2020-10-21 DIAGNOSIS — E6609 Other obesity due to excess calories: Secondary | ICD-10-CM | POA: Diagnosis not present

## 2020-10-21 DIAGNOSIS — Z6830 Body mass index (BMI) 30.0-30.9, adult: Secondary | ICD-10-CM

## 2020-10-21 DIAGNOSIS — H6123 Impacted cerumen, bilateral: Secondary | ICD-10-CM | POA: Diagnosis not present

## 2020-10-21 DIAGNOSIS — R0981 Nasal congestion: Secondary | ICD-10-CM

## 2020-10-21 DIAGNOSIS — R42 Dizziness and giddiness: Secondary | ICD-10-CM | POA: Diagnosis not present

## 2020-10-21 LAB — POCT URINALYSIS DIPSTICK
Bilirubin, UA: NEGATIVE
Blood, UA: NEGATIVE
Glucose, UA: NEGATIVE
Ketones, UA: NEGATIVE
Leukocytes, UA: NEGATIVE
Nitrite, UA: NEGATIVE
Protein, UA: POSITIVE — AB
Spec Grav, UA: 1.02 (ref 1.010–1.025)
Urobilinogen, UA: 0.2 E.U./dL
pH, UA: 6.5 (ref 5.0–8.0)

## 2020-10-21 MED ORDER — FLUTICASONE PROPIONATE 50 MCG/ACT NA SUSP: 2.0000 | Freq: Every day | NASAL | 2 refills | Status: DC

## 2020-10-21 NOTE — Progress Notes (Signed)
I,Tianna Badgett,acting as a Education administrator for [employer], FNP.,have documented all relevant documentation on the behalf of [name], FNP,as directed by  [name], FNP while in the presence of [name], [place].  This visit occurred during the SARS-CoV-2 public health emergency.  Safety protocols were in place, including screening questions prior to the visit, additional usage of staff PPE, and extensive cleaning of exam room while observing appropriate contact time as indicated for disinfecting solutions.  Subjective:     Patient ID: [name] [name] , female    DOB: 10/02/1940 , 80 y.o.   MRN: [id number]   Chief Complaint  Patient presents with   Dizziness         HPI  Patient is here for dizziness. When she gets up her head feels dizzy. This has been going on for 3 days. Blood sugars ranging 117-125.  She is having phlegm. She has been more nasally and throat irritation for the last 2 weeks. She drinks approximately 4 - 8 oz bottles of water a day. She does not like the nasal sprays.   Dizziness This is a new problem. Episode onset: 3 days. The problem occurs intermittently. Pertinent negatives include no abdominal pain, anorexia, congestion, fatigue, headaches, nausea, sore throat, vertigo, visual change, vomiting or weakness. Exacerbated by: changing positions.    Past Medical History:  Diagnosis Date   Diabetes mellitus ([place])    H/O: CVA (cerebrovascular accident) 2002   HTN (hypertension)    Hyperlipidemia    Pulmonary HTN ([facility])    mild by echo     Family History  Problem Relation Age of Onset   Hypertension Mother    Hypertension Father    Heart Problems Father    Hypertension Sister    Diabetes Sister    Hypertension Brother    Diabetes Brother      Current Outpatient Medications:    fluticasone (FLONASE) 50 MCG/ACT nasal spray, Place 2 sprays into both nostrils daily., Disp: 18.2 mL, Rfl: 2   ACCU-CHEK AVIVA PLUS test strip, TEST THREE TIMES DAILY AS  DIRECTED, Disp: 100 strip, Rfl: 11   amLODipine (NORVASC) 10 MG tablet, TAKE 1 TABLET BY MOUTH EVERY DAY, Disp: 90 tablet, Rfl: 1   aspirin 325 MG EC tablet, Take 325 mg by mouth daily., Disp: , Rfl:    B-D UF III MINI PEN NEEDLES 31G X 5 MM MISC, Use as directed 4x a day, Disp: 300 each, Rfl: 3   cetirizine (ZYRTEC ALLERGY) 10 MG tablet, Take 1 tablet (10 mg total) by mouth daily., Disp: 90 tablet, Rfl: 1   glipiZIDE (GLUCOTROL XL) 2.5 MG 24 hr tablet, Take 5 mg by mouth every morning., Disp: , Rfl:    hydrALAZINE (APRESOLINE) 25 MG tablet, TAKE 1 TABLET(25 MG) BY MOUTH TWICE DAILY, Disp: 180 tablet, Rfl: 1   hydrOXYzine (ATARAX/VISTARIL) 25 MG tablet, Take 1 tablet by mouth at bedtime as needed. 1-2 tablets at bedtime prn, Disp: , Rfl:    insulin lispro (HUMALOG KWIKPEN) 200 UNIT/ML KwikPen, Inject under skin 8-10 units 15 min before the 3 main meals, Disp: 9 mL, Rfl: 3   Lancets (ACCU-CHEK SOFT TOUCH) lancets, Check blood sugars 3 times daily E11.65, Disp: 100 each, Rfl: 12   latanoprost (XALATAN) 0.005 % ophthalmic solution, Place 1 drop into both eyes at bedtime. , Disp: , Rfl:    meclizine (ANTIVERT) 12.5 MG tablet, Take 1 tablet (12.5 mg total) by mouth 3 (three) times daily as needed for dizziness., Disp:  30 tablet, Rfl: 0   metoprolol succinate (TOPROL-XL) 25 MG 24 hr tablet, TAKE 1 TABLET BY MOUTH EVERY DAY, Disp: 90 tablet, Rfl: 1   Multiple Vitamin (MULTIVITAMIN) tablet, Take 1 tablet by mouth daily., Disp: , Rfl:    rosuvastatin (CRESTOR) 10 MG tablet, TAKE 1 TABLET BY MOUTH EVERY DAY, Disp: 90 tablet, Rfl: 1   Semaglutide, 1 MG/DOSE, (OZEMPIC, 1 MG/DOSE,) 2 MG/1.5ML SOPN, Inject 1 mg into the skin once a week., Disp: 6 pen, Rfl: 3   telmisartan-hydrochlorothiazide (MICARDIS HCT) 80-25 MG tablet, TAKE 1 TABLET BY MOUTH EVERY DAY, Disp: 90 tablet, Rfl: 1   TRESIBA FLEXTOUCH 200 UNIT/ML FlexTouch Pen, INJECT 28 UNITS INTO THE SKIN AT BEDTIME, Disp: 15 mL, Rfl: 1   Allergies  Allergen  Reactions   Brimonidine Other (See Comments)    Eyes swell and itch   Dorzolamide     Other reaction(s): Other (See Comments) Red, inflamed eyelid, dermatitis    Timolol Other (See Comments)    Caused eyes to swell and itch     Review of Systems  Constitutional: Negative.  Negative for fatigue.  HENT:  Negative for congestion and sore throat.   Respiratory: Negative.    Cardiovascular: Negative.   Gastrointestinal: Negative.  Negative for abdominal pain, anorexia, nausea and vomiting.  Neurological:  Positive for dizziness. Negative for vertigo, weakness and headaches.    Today's Vitals   10/21/20 1014  Temp: 98 F (36.7 C)  TempSrc: Oral  Weight: 158 lb 9.6 oz (71.9 kg)  Height: 5' 0.8" (1.544 m)   Body mass index is 30.16 kg/m.  Wt Readings from Last 3 Encounters:  11/08/20 160 lb 9.6 oz (72.8 kg)  10/21/20 158 lb 9.6 oz (71.9 kg)  09/09/20 157 lb 3.2 oz (71.3 kg)    Objective:  Physical Exam Constitutional:      General: She is not in acute distress.    Appearance: Normal appearance.  HENT:     Right Ear: External ear normal. There is no impacted cerumen (full cerumen but not impacted).     Left Ear: External ear normal. There is no impacted cerumen (full cerumen but not impacted).     Nose: No congestion.     Right Sinus: No frontal sinus tenderness.     Left Sinus: No frontal sinus tenderness.  Cardiovascular:     Rate and Rhythm: Normal rate and regular rhythm.     Pulses: Normal pulses.     Heart sounds: Normal heart sounds. No murmur heard. Pulmonary:     Effort: Pulmonary effort is normal. No respiratory distress.     Breath sounds: Normal breath sounds.  Neurological:     Mental Status: She is alert.  Psychiatric:        Mood and Affect: Mood normal.        Behavior: Behavior normal.        Thought Content: Thought content normal.        Judgment: Judgment normal.        Assessment And Plan:     1. Dizziness Comments: Orthostats are slightly  positive with 20 point decrease in systolic  Encouraged to stay well hydrated - POCT Urinalysis Dipstick (81002) - fluticasone (FLONASE) 50 MCG/ACT nasal spray; Place 2 sprays into both nostrils daily.  Dispense: 18.2 mL; Refill: 2  2. Nasal congestion Comments: take over the counter antihistamine may be related to full sinuses - fluticasone (FLONASE) 50 MCG/ACT nasal spray; Place 2 sprays into both nostrils  daily.  Dispense: 18.2 mL; Refill: 2  3. Excessive cerumen in both ear canals Comments: This can contribute to dizziness, due to the congestion did not remove  Encouraged to use 1/2 water and 1/2 peroxide to ears  4. Class 1 obesity due to excess calories with serious comorbidity and body mass index (BMI) of 30.0 to 30.9 in adult She is encouraged to strive for BMI less than 30 to decrease cardiac risk. Advised to aim for at least 150 minutes of exercise per week.    Patient was given opportunity to ask questions. Patient verbalized understanding of the plan and was able to repeat key elements of the plan. All questions were answered to their satisfaction.  [name], FNP    I, [name], FNP, have reviewed all documentation for this visit. The documentation on 10/21/20 for the exam, diagnosis, procedures, and orders are all accurate and complete.  IF YOU HAVE BEEN REFERRED TO A SPECIALIST, IT MAY TAKE 1-2 WEEKS TO SCHEDULE/PROCESS THE REFERRAL. IF YOU HAVE NOT HEARD FROM US/SPECIALIST IN TWO WEEKS, PLEASE GIVE [place] A CALL AT [phone].   THE PATIENT IS ENCOURAGED TO PRACTICE SOCIAL DISTANCING DUE TO THE COVID-19 PANDEMIC.

## 2020-10-21 NOTE — Patient Instructions (Signed)
Dizziness Dizziness is a common problem. It makes you feel unsteady or light-headed. You may feel like you are about to pass out (faint). Dizziness can lead to getting hurt if you stumble or fall. Dizziness can be caused by many things, including: Medicines. Not having enough water in your body (dehydration). Illness. Follow these instructions at home: Eating and drinking  Drink enough fluid to keep your pee (urine) pale yellow. This helps to keep you from getting dehydrated. Try to drink more clear fluids, such as water. Do not drink alcohol. Limit how much caffeine you drink or eat, if your doctor tells you to do that. Limit how much salt (sodium) you drink or eat, if your doctor tells you to do that.  Activity  Avoid making quick movements. Stand up slowly from sitting in a chair, and steady yourself until you feel okay. In the morning, first sit up on the side of the bed. When you feel okay, stand up slowly while you hold onto something. Do this until you know that your balance is okay. If you need to stand in one place for a long time, move your legs often. Tighten and relax the muscles in your legs while you are standing. Do not drive or use machinery if you feel dizzy. Avoid bending down if you feel dizzy. Place items in your home so you can reach them easily without leaning over.  Lifestyle Do not smoke or use any products that contain nicotine or tobacco. If you need help quitting, ask your doctor. Try to lower your stress level. You can do this by using methods such as yoga or meditation. Talk with your doctor if you need help. General instructions Watch your dizziness for any changes. Take over-the-counter and prescription medicines only as told by your doctor. Talk with your doctor if you think that you are dizzy because of a medicine that you are taking. Tell a friend or a family member that you are feeling dizzy. If he or she notices any changes in your behavior, have this  person call your doctor. Keep all follow-up visits. Contact a doctor if: Your dizziness does not go away. Your dizziness or light-headedness gets worse. You feel like you may vomit (are nauseous). You have trouble hearing. You have new symptoms. You are unsteady on your feet. You feel like the room is spinning. You have neck pain or a stiff neck. You have a fever. Get help right away if: You vomit or have watery poop (diarrhea), and you cannot eat or drink anything. You have trouble: Talking. Walking. Swallowing. Using your arms, hands, or legs. You feel generally weak. You are not thinking clearly, or you have trouble forming sentences. A friend or family member may notice this. You have: Chest pain. Pain in your belly (abdomen). Shortness of breath. Sweating. Your vision changes. You are bleeding. You have a very bad headache. These symptoms may be an emergency. Get help right away. Call your local emergency services (911 in the [place] Do not wait to see if the symptoms will go away. Do not drive yourself to the hospital. Summary Dizziness makes you feel unsteady or light-headed. You may feel like you are about to pass out (faint). Drink enough fluid to keep your pee (urine) pale yellow. Do not drink alcohol. Avoid making quick movements if you feel dizzy. Watch your dizziness for any changes. This information is not intended to replace advice given to you by your health care provider. Make sure you discuss   any questions you have with your healthcare provider. Document Revised: 03/01/2020 Document Reviewed: 03/01/2020 Elsevier Patient Education  2022 [employer]  

## 2020-10-25 ENCOUNTER — Telehealth: Payer: Self-pay

## 2020-10-25 ENCOUNTER — Other Ambulatory Visit: Payer: Self-pay | Admitting: Internal Medicine

## 2020-10-25 NOTE — Chronic Care Management (AMB) (Signed)
  Patient aware of telephone appointment with [name] CPP on 10-26-2020 at 9:45. Patient aware to have/bring all medications, supplements, blood pressure and/or blood sugar logs to visit.  Questions: Have you had any recent office visit or specialist visit outside of [facility]? Patient stated no  Are there any concerns you would like to discuss during your office visit? Patient stated she is still having issues with her sinus and dizziness.  Are you having any problems obtaining your medications? (Whether it pharmacy issues or cost) Patient stated no  If patient has any PAP medications ask if they are having any problems getting their PAP medication or refill? No PAP medications  Care Gaps: Tdap overdue Foot exam overdue Ophthalmology overdue Medicare Wellness 10-02-2021 RAF= 1.242%  Star Rating Drug: Glipizide 2.5 mg- Last filled 08-18-2020 90 DS [facility] Telmisartan/HCTZ 80-25 mg- Last filled 08-23-2020 90 DS [facility] Rosuvastatin 10 mg- Last filled 08-17-2020 90 DS [facility] Ozempic 1 mg- Last filled 06-29-2020 28 DS [facility]  Any gaps in medications fill history? No  [place] Pharmacist Assistant [phone]

## 2020-10-26 ENCOUNTER — Ambulatory Visit (INDEPENDENT_AMBULATORY_CARE_PROVIDER_SITE_OTHER): Payer: Medicare Other

## 2020-10-26 DIAGNOSIS — Z794 Long term (current) use of insulin: Secondary | ICD-10-CM | POA: Diagnosis not present

## 2020-10-26 DIAGNOSIS — E1165 Type 2 diabetes mellitus with hyperglycemia: Secondary | ICD-10-CM

## 2020-10-26 DIAGNOSIS — E1159 Type 2 diabetes mellitus with other circulatory complications: Secondary | ICD-10-CM

## 2020-10-26 DIAGNOSIS — IMO0002 Reserved for concepts with insufficient information to code with codable children: Secondary | ICD-10-CM

## 2020-10-26 DIAGNOSIS — I129 Hypertensive chronic kidney disease with stage 1 through stage 4 chronic kidney disease, or unspecified chronic kidney disease: Secondary | ICD-10-CM

## 2020-10-26 NOTE — Patient Instructions (Signed)
Visit Information It was great speaking with you today!  Please let me know if you have any questions about our visit.   Goals Addressed             This Visit's Progress    Manage My Medicine       Timeframe:  Long-Range Goal Priority:  High Start Date:    07/27/2020                         Expected End Date:                       Follow Up Date 03/31/2021   - call for medicine refill 2 or 3 days before it runs out - call if I am sick and can't take my medicine - keep a list of all the medicines I take; vitamins and herbals too - use a pillbox to sort medicine - use an alarm clock or phone to remind me to take my medicine    Why is this important?   These steps will help you keep on track with your medicines.           Patient Care Plan: CCM Pharmacy Care Plan     Problem Identified: HTN, DM II   Priority: High     Long-Range Goal: Disease Management   Start Date: 07/27/2020  Recent Progress: On track  Priority: High  Note:     Current Barriers:  Unable to achieve control of diabetes    Pharmacist Clinical Goal(s):  Patient will achieve adherence to monitoring guidelines and medication adherence to achieve therapeutic efficacy through collaboration with PharmD and provider.   Interventions: 1:1 collaboration with Glendale Chard, MD regarding development and update of comprehensive plan of care as evidenced by provider attestation and co-signature Inter-disciplinary care team collaboration (see longitudinal plan of care) Comprehensive medication review performed; medication list updated in electronic medical record  Hypertension (BP goal <130/80) -Controlled -Current treatment: Amlodipine 10 mg tablet once per day Hydralazine 25 mg tablet taking it twice per day  Metoprolol Succinate 25 mg tablet once per day  Telmisartan - hydrochlorothiazide 80 -25 mg tablet once per day  -Current home readings: 117/74, 145/72 -Current dietary habits: please see  diabetes for more information  -Current exercise habits: curently not exercising  -Denies hypotensive/hypertensive symptoms -Educated on BP goals and benefits of medications for prevention of heart attack, stroke and kidney damage; Importance of home blood pressure monitoring; -Counseled to monitor BP at home every day, document, and provide log at future appointments -Recommended to continue current medication  Diabetes (A1c goal <8%) -Uncontrolled -Current medications: Ozempic 1 mg once a week  Tresiba Flextouch 200 unitl - 28 units into the skin at bedtime Humalog- inject 8-10 units 15 minutes before the 3 main meals.  Glipizide 2.5 mg tablet once per day  -Medications previously tried: Levemir, Metformin -Current home glucose readings fasting glucose: 120's - 130, sometimes they increase to 145  -Denies hypoglycemic/hyperglycemic symptoms -Current meal patterns:  breakfast: boiled egg, couple pieces of toast, apple sauce and coffee  lunch: whatever is in the refrigerator cooked  dinner: vegetables, and whatever is available to cook. Usually something she makes at home like chicken and green beans snacks: she does not eat snacks usually but sometimes she eats Nabs, not everyday but sometimes  drinks: cup of coffee, and water, eating fruit cups daily  -Current exercise: she has not done to  much exercise, she tries to exercise one a week when she feels well.  -Educated on Exercise goal of 150 minutes per week; -Goal Ms. [place] is going to start eating whole fruit and cut back on fruit cups  -Counseled to check feet daily and get yearly eye exams -Recommended to continue current medication  Patient Goals/Self-Care Activities Patient will:  - take medications as prescribed  Follow Up Plan: The patient has been provided with contact information for the care management team and has been advised to call with any health related questions or concerns.        Patient agreed to  services and verbal consent obtained.   The patient verbalized understanding of instructions, educational materials, and care plan provided today and agreed to receive a mailed copy of patient instructions, educational materials, and care plan.   [name], PharmD Clinical Pharmacist [facility] [phone]

## 2020-10-26 NOTE — Progress Notes (Addendum)
Chronic Care Management Pharmacy Note  10/26/2020 Name:  [name] [name] MRN:  [id number] DOB:  Feb 20, 1941  Summary: Patient reports that she has not been feeling well and was prescribed ear drops and nasal spray.   Recommendations/Changes made from today's visit: Recommend patient continue to check BS and BP daily.  Recommend patient increase fresh fruits and vegetables  Plan: Check BS on a consistent basis.  Substitute fresh fruit for fruit cups Take medication at the same time each day.   Subjective: [name] [name] is an 80 y.o. year old female who is a primary patient of Glendale Chard, MD.  The CCM team was consulted for assistance with disease management and care coordination needs.    Engaged with patient by telephone for follow up visit in response to provider referral for pharmacy case management and/or care coordination services.   Consent to Services:  The patient was given information about Chronic Care Management services, agreed to services, and gave verbal consent prior to initiation of services.  Please see initial visit note for detailed documentation.   Patient Care Team: Glendale Chard, MD as PCP - General (Internal Medicine) Mayford Knife, [facility] (Pharmacist)  Recent office visits: 09/13/2020 PCP OV  Recent consult visits: 06/16/2020 Opthamology OV 04/19/2020 Nephrology [facility] visits: None in previous 6 months   Objective:  Lab Results  Component Value Date   CREATININE 2.37 (H) 09/09/2020   BUN 46 (H) 09/09/2020   GFRNONAA 17 (L) 03/15/2020   GFRAA 20 (L) 03/15/2020   NA 147 (H) 09/09/2020   K 4.8 09/09/2020   CALCIUM 10.4 (H) 09/09/2020   CO2 24 09/09/2020   GLUCOSE 96 09/09/2020    Lab Results  Component Value Date/Time   HGBA1C 10.0 (H) 09/09/2020 12:15 PM   HGBA1C 10.4 (H) 03/15/2020 11:49 AM   MICROALBUR 150 09/09/2020 12:43 PM   MICROALBUR 150 09/04/2019 02:19 PM    Last diabetic Eye exam:  Lab Results  Component  Value Date/Time   HMDIABEYEEXA Retinopathy (A) 05/23/2018 12:00 AM    Last diabetic Foot exam: No results found for: HMDIABFOOTEX   Lab Results  Component Value Date   CHOL 122 03/15/2020   HDL 30 (L) 03/15/2020   LDLCALC 60 03/15/2020   TRIG 190 (H) 03/15/2020   CHOLHDL 4.1 03/15/2020    Hepatic Function Latest Ref Rng & Units 09/09/2020 03/15/2020 09/11/2019  Total Protein 6.0 - 8.5 g/dL 7.3 7.5 7.0  Albumin 3.6 - 4.6 g/dL 4.5 4.3 4.1  AST 0 - 40 IU/L 19 27 13   ALT 0 - 32 IU/L 15 27 10   Alk Phosphatase 44 - 121 IU/L 118 120 106  Total Bilirubin 0.0 - 1.2 mg/dL <0.2 <0.2 0.2    Lab Results  Component Value Date/Time   TSH 1.410 03/15/2020 11:49 AM    CBC Latest Ref Rng & Units 09/09/2020 09/11/2019 02/12/2019  WBC 3.4 - 10.8 x10E3/uL 10.2 11.9(H) 9.9  Hemoglobin 11.1 - 15.9 g/dL 10.7(L) 10.0(L) 10.8(L)  Hematocrit 34.0 - 46.6 % 32.9(L) 31.5(L) 32.3(L)  Platelets 150 - 450 x10E3/uL 388 448 382    No results found for: VD25OH  Clinical ASCVD: No  The ASCVD Risk score Mikey Bussing DC Jr., et al., 2013) failed to calculate for the following reasons:   The 2013 ASCVD risk score is only valid for ages 50 to 26    Depression screen PHQ 2/9 09/09/2020 10/14/2019 09/04/2019  Decreased Interest 0 0 0  Down, Depressed, Hopeless 0 0 0  PHQ - 2 Score 0 0 0  Altered sleeping - - 0  Tired, decreased energy - - 0  Change in appetite - - 0  Feeling bad or failure about yourself  - - 0  Trouble concentrating - - 0  Moving slowly or fidgety/restless - - 0  Suicidal thoughts - - 0  PHQ-9 Score - - 0  Difficult doing work/chores - - Not difficult at all     Social History   Tobacco Use  Smoking Status Never  Smokeless Tobacco Never   BP Readings from Last 3 Encounters:  09/09/20 116/62  09/09/20 116/62  03/30/20 120/70   Pulse Readings from Last 3 Encounters:  09/09/20 70  09/09/20 70  03/15/20 71   Wt Readings from Last 3 Encounters:  10/21/20 158 lb 9.6 oz (71.9 kg)  09/09/20 157 lb  3.2 oz (71.3 kg)  09/09/20 157 lb (71.2 kg)   BMI Readings from Last 3 Encounters:  10/21/20 30.16 kg/m  09/09/20 30.50 kg/m  09/09/20 30.46 kg/m    Assessment/Interventions: Review of patient past medical history, allergies, medications, health status, including review of consultants reports, laboratory and other test data, was performed as part of comprehensive evaluation and provision of chronic care management services.   SDOH:  (Social Determinants of Health) assessments and interventions performed: No  SDOH Screenings   Alcohol Screen: Not on file  Depression (PHQ2-9): Low Risk    PHQ-2 Score: 0  Financial Resource Strain: Low Risk    Difficulty of Paying Living Expenses: Not hard at all  Food Insecurity: No Food Insecurity   Worried About Charity fundraiser in the Last Year: Never true   Ran Out of Food in the Last Year: Never true  Housing: Not on file  Physical Activity: Inactive   Days of Exercise per Week: 0 days   Minutes of Exercise per Session: 0 min  Social Connections: Not on file  Stress: No Stress Concern Present   Feeling of Stress : Not at all  Tobacco Use: Low Risk    Smoking Tobacco Use: Never   Smokeless Tobacco Use: Never  Transportation Needs: No Transportation Needs   Lack of Transportation (Medical): No   Lack of Transportation (Non-Medical): No    CCM Care Plan  Allergies  Allergen Reactions   Brimonidine Other (See Comments)    Eyes swell and itch   Dorzolamide     Other reaction(s): Other (See Comments) Red, inflamed eyelid, dermatitis    Timolol Other (See Comments)    Caused eyes to swell and itch    Medications Reviewed Today     Reviewed by Mayford Knife, RPH (Pharmacist) on 10/26/20 at 1100  Med List Status: <None>   Medication Order Taking? Sig Documenting Provider Last Dose Status Informant  ACCU-CHEK AVIVA PLUS test strip [id number] Yes TEST THREE TIMES DAILY AS DIRECTED Glendale Chard, MD Taking Active   amLODipine  (NORVASC) 10 MG tablet [id number] Yes TAKE 1 TABLET BY MOUTH EVERY DAY Glendale Chard, MD Taking Active   aspirin 325 MG EC tablet [id number]  Take 325 mg by mouth daily. [provider]  Active Family Member  B-D UF III MINI PEN NEEDLES 31G X 5 MM MISC [id number]  Use as directed 4x a day Philemon Kingdom, MD  Active   fluticasone (FLONASE) 50 MCG/ACT nasal spray [id number]  Place 2 sprays into both nostrils daily. [name], FNP  Active   glipiZIDE (GLUCOTROL XL) 2.5 MG 24 hr tablet [id number]  Take 5 mg by mouth every morning. [provider]  Active   hydrALAZINE (APRESOLINE) 25 MG tablet [id number]  TAKE 1 TABLET(25 MG) BY MOUTH TWICE DAILY Glendale Chard, MD  Active   hydrOXYzine (ATARAX/VISTARIL) 25 MG tablet [id number]  Take 1 tablet by mouth at bedtime as needed. 1-2 tablets at bedtime prn [provider]  Active Family Member  insulin lispro (HUMALOG KWIKPEN) 200 UNIT/ML KwikPen [id number]  Inject under skin 8-10 units 15 min before the 3 main meals Philemon Kingdom, MD  Active   Lancets (ACCU-CHEK SOFT TOUCH) lancets [id number]  Check blood sugars 3 times daily E11.65 Glendale Chard, MD  Active Family Member  latanoprost (XALATAN) 0.005 % ophthalmic solution [id number]  Place 1 drop into both eyes at bedtime.  [provider]  Active   loratadine (CLARITIN) 10 MG tablet [id number]  Take 10 mg by mouth daily. [provider]  Active Self  metoprolol succinate (TOPROL-XL) 25 MG 24 hr tablet [id number]  TAKE 1 TABLET BY MOUTH EVERY DAY Glendale Chard, MD  Active   Multiple Vitamin (MULTIVITAMIN) tablet [id number]  Take 1 tablet by mouth daily. [provider]  Active Family Member  rosuvastatin (CRESTOR) 10 MG tablet [id number]  TAKE 1 TABLET BY MOUTH EVERY DAY Glendale Chard, MD  Active   Semaglutide, 1 MG/DOSE, (OZEMPIC, 1 MG/DOSE,) 2 MG/1.5ML SOPN [id number]  Inject 1 mg into the skin once a week. Philemon Kingdom, MD  Active    telmisartan-hydrochlorothiazide (MICARDIS HCT) 80-25 MG tablet [id number]  TAKE 1 TABLET BY MOUTH EVERY DAY Glendale Chard, MD  Active   TRESIBA FLEXTOUCH 200 UNIT/ML FlexTouch Pen [id number]  INJECT 50 UNITS INTO THE SKIN AT BEDTIME Glendale Chard, MD  Active             Patient Active Problem List   Diagnosis Date Noted   Diabetic polyneuropathy associated with type 2 diabetes mellitus ([place]) 09/20/2020   Hypertensive nephropathy 09/20/2020   Right hand paresthesia 09/20/2020   Diabetic retinopathy associated with type 2 diabetes mellitus ([place]) 09/20/2020   Ptosis of eyelid, left 09/20/2020   Class 1 obesity due to excess calories with serious comorbidity and body mass index (BMI) of 30.0 to 30.9 in adult 09/20/2020   Hyperlipidemia associated with type 2 diabetes mellitus ([place]) 10/09/2018   Overweight (BMI 25.0-29.9) 10/09/2018   Left ventricular diastolic dysfunction, NYHA class 1 02/06/2013   HTN (hypertension)    Uncontrolled type 2 diabetes mellitus with circulatory disorder, with long-term current use of insulin ([place])     Immunization History  Administered Date(s) Administered   Fluad Quad(high Dose 65+) 12/12/2018   Influenza, High Dose Seasonal PF 01/08/2018, 01/02/2019   Influenza-Unspecified 04/10/2017   Moderna Sars-Covid-2 Vaccination 05/22/2019, 06/20/2019, 01/30/2020, 07/20/2020   Pneumococcal Conjugate-13 01/07/2014   Pneumococcal Polysaccharide-23 03/31/2014   Pneumococcal-Unspecified 03/31/2014   Zoster Recombinat (Shingrix) 02/22/2020, 07/27/2020   Zoster, Live 12/31/2007    Conditions to be addressed/monitored:  Hyperlipidemia and Diabetes  Care Plan : [place]  Updates made by Mayford Knife, [place] since 10/26/2020 12:00 AM     Problem: HTN, DM II   Priority: High     Long-Range Goal: Disease Management   Start Date: 07/27/2020  Recent Progress: On track  Priority: High  Note:     Current Barriers:  Unable to achieve control of  diabetes    Pharmacist Clinical Goal(s):  Patient will achieve adherence to monitoring guidelines and medication adherence to achieve therapeutic efficacy through collaboration with  PharmD and provider.   Interventions: 1:1 collaboration with Glendale Chard, MD regarding development and update of comprehensive plan of care as evidenced by provider attestation and co-signature Inter-disciplinary care team collaboration (see longitudinal plan of care) Comprehensive medication review performed; medication list updated in electronic medical record  Hypertension (BP goal <130/80) -Controlled -Current treatment: Amlodipine 10 mg tablet once per day Hydralazine 25 mg tablet taking it twice per day  Metoprolol Succinate 25 mg tablet once per day  Telmisartan - hydrochlorothiazide 80 -25 mg tablet once per day  -Current home readings: 117/74, 145/72 -Current dietary habits: please see diabetes for more information  -Current exercise habits: curently not exercising  -Denies hypotensive/hypertensive symptoms -Educated on BP goals and benefits of medications for prevention of heart attack, stroke and kidney damage; Importance of home blood pressure monitoring; -Counseled to monitor BP at home every day, document, and provide log at future appointments -Recommended to continue current medication  Diabetes (A1c goal <8%) -Uncontrolled -Current medications: Ozempic 1 mg once a week  Tresiba Flextouch 200 unitl - 28 units into the skin at bedtime Humalog- inject 8-10 units 15 minutes before the 3 main meals.  Glipizide 2.5 mg tablet once per day  -Medications previously tried: Levemir, Metformin -Current home glucose readings fasting glucose: 120's - 130, sometimes they increase to 145  -Denies hypoglycemic/hyperglycemic symptoms -Current meal patterns:  breakfast: boiled egg, couple pieces of toast, apple sauce and coffee  lunch: whatever is in the refrigerator cooked  dinner: vegetables,  and whatever is available to cook. Usually something she makes at home like chicken and green beans snacks: she does not eat snacks usually but sometimes she eats Nabs, not everyday but sometimes  drinks: cup of coffee, and water, eating fruit cups daily  -Current exercise: she has not done to much exercise, she tries to exercise one a week when she feels well.  -Educated on Exercise goal of 150 minutes per week; -Goal Ms. Fiumara is going to start eating whole fruit and cut back on fruit cups  -Counseled to check feet daily and get yearly eye exams -Recommended to continue current medication  Patient Goals/Self-Care Activities Patient will:  - take medications as prescribed  Follow Up Plan: The patient has been provided with contact information for the care management team and has been advised to call with any health related questions or concerns.        Medication Assistance: None required.  Patient affirms current coverage meets needs.  Compliance/Adherence/Medication fill history: Care Gaps: TDAP vaccine  Opthamology Exam - not scheduled until October  Foot Exam - not scheduled yet, she is going to make one for this year   Star-Rating Drugs: Ozempic 1 mg  Glipizide 2.5 mg  Telmisartan - HCTZ 80-25 mg Rosuvustatin 10 mg   Patient's preferred pharmacy is:  [facility], [place] [facility] [place] [address] [place] [address] Phone: [phone] Fax: [phone]  Uses pill box? Yes Pt endorses 85% compliance  We discussed: Benefits of medication synchronization, packaging and delivery as well as enhanced pharmacist oversight with [employer]. Patient decided to: Continue current medication management strategy  Care Plan and Follow Up Patient Decision:  Patient agrees to Care Plan and Follow-up.  Plan: The patient has been provided with contact information for the care management team and has been  advised to call with any health related questions or concerns.   [name], PharmD Clinical Pharmacist [facility] [phone]

## 2020-10-28 ENCOUNTER — Encounter: Payer: Self-pay | Admitting: Internal Medicine

## 2020-11-01 ENCOUNTER — Other Ambulatory Visit: Payer: Self-pay | Admitting: Nurse Practitioner

## 2020-11-01 ENCOUNTER — Telehealth: Payer: Self-pay | Admitting: Nurse Practitioner

## 2020-11-01 ENCOUNTER — Other Ambulatory Visit: Payer: Self-pay | Admitting: Internal Medicine

## 2020-11-01 DIAGNOSIS — Z1231 Encounter for screening mammogram for malignant neoplasm of breast: Secondary | ICD-10-CM

## 2020-11-01 DIAGNOSIS — R0981 Nasal congestion: Secondary | ICD-10-CM

## 2020-11-01 DIAGNOSIS — R42 Dizziness and giddiness: Secondary | ICD-10-CM

## 2020-11-01 MED ORDER — MECLIZINE HCL 12.5 MG PO TABS: 12.5000 mg | ORAL_TABLET | Freq: Three times a day (TID) | ORAL | 0 refills | Status: DC | PRN

## 2020-11-01 MED ORDER — AMOXICILLIN-POT CLAVULANATE 875-125 MG PO TABS: 1.0000 | ORAL_TABLET | Freq: Two times a day (BID) | ORAL | 0 refills | Status: DC

## 2020-11-01 NOTE — Telephone Encounter (Signed)
Called daughter Roselee Nova about her mother's continued dizziness, she has been giving her mucinex dm with a little relief. I will send a Rx for augmentin and meclizine as needed. Will f/u on Wednesday to see if doing better.

## 2020-11-08 ENCOUNTER — Encounter: Payer: Self-pay | Admitting: Nurse Practitioner

## 2020-11-08 ENCOUNTER — Other Ambulatory Visit: Payer: Self-pay

## 2020-11-08 ENCOUNTER — Ambulatory Visit (INDEPENDENT_AMBULATORY_CARE_PROVIDER_SITE_OTHER): Payer: Medicare Other | Admitting: Nurse Practitioner

## 2020-11-08 VITALS — BP 142/70 | HR 62 | Temp 97.9°F | Ht 60.4 in | Wt 160.6 lb

## 2020-11-08 DIAGNOSIS — H6122 Impacted cerumen, left ear: Secondary | ICD-10-CM

## 2020-11-08 DIAGNOSIS — R0981 Nasal congestion: Secondary | ICD-10-CM | POA: Diagnosis not present

## 2020-11-08 DIAGNOSIS — R42 Dizziness and giddiness: Secondary | ICD-10-CM

## 2020-11-08 MED ORDER — CETIRIZINE HCL 10 MG PO TABS: 10.0000 mg | ORAL_TABLET | Freq: Every day | ORAL | 1 refills | Status: DC

## 2020-11-08 NOTE — Progress Notes (Signed)
I,Yamilka Roman [employer] as a Education administrator for [employer], FNP.,have documented all relevant documentation on the behalf of [name], FNP,as directed by  [name], FNP while in the presence of [name], [place].  This visit occurred during the SARS-CoV-2 public health emergency.  Safety protocols were in place, including screening questions prior to the visit, additional usage of staff PPE, and extensive cleaning of exam room while observing appropriate contact time as indicated for disinfecting solutions.  Subjective:     Patient ID: [name] [name] , female    DOB: 03-Jul-1940 , 80 y.o.   MRN: [id number]   Chief Complaint  Patient presents with   Dizziness    HPI  Patient presents today for a f/u on her sinus problems and dizziness. She stated she is feeling better, rating her improvement to 7/10. She took her last dose yesterday.  She is using flonase nasal spray and zyrtec. She is taking the meclizine 3 times a day due to when she stands up she is unsteady.  She does well after standing and walking. She is drinking about 36 oz of water a day. Her daughter feels like her hearing is better since taking her Augmentin.   Daughter is present during visit.      Dizziness This is a recurrent problem. The current episode started 1 to 4 weeks ago. The problem occurs constantly. The problem has been gradually improving. Pertinent negatives include no abdominal pain, anorexia, chest pain, congestion, fatigue, headaches, nausea, sore throat, vertigo, vomiting or weakness. Exacerbated by: changing positions. Treatments tried: Now on antibiotics.    Past Medical History:  Diagnosis Date   Diabetes mellitus ([place])    H/O: CVA (cerebrovascular accident) 2002   HTN (hypertension)    Hyperlipidemia    Pulmonary HTN ([facility])    mild by echo     Family History  Problem Relation Age of Onset   Hypertension Mother    Hypertension Father    Heart Problems Father    Hypertension Sister     Diabetes Sister    Hypertension Brother    Diabetes Brother      Current Outpatient Medications:    ACCU-CHEK AVIVA PLUS test strip, TEST THREE TIMES DAILY AS DIRECTED, Disp: 100 strip, Rfl: 11   amLODipine (NORVASC) 10 MG tablet, TAKE 1 TABLET BY MOUTH EVERY DAY, Disp: 90 tablet, Rfl: 1   aspirin 325 MG EC tablet, Take 325 mg by mouth daily., Disp: , Rfl:    B-D UF III MINI PEN NEEDLES 31G X 5 MM MISC, Use as directed 4x a day, Disp: 300 each, Rfl: 3   cetirizine (ZYRTEC ALLERGY) 10 MG tablet, Take 1 tablet (10 mg total) by mouth daily., Disp: 90 tablet, Rfl: 1   fluticasone (FLONASE) 50 MCG/ACT nasal spray, Place 2 sprays into both nostrils daily., Disp: 18.2 mL, Rfl: 2   glipiZIDE (GLUCOTROL XL) 2.5 MG 24 hr tablet, Take 5 mg by mouth every morning., Disp: , Rfl:    hydrALAZINE (APRESOLINE) 25 MG tablet, TAKE 1 TABLET(25 MG) BY MOUTH TWICE DAILY, Disp: 180 tablet, Rfl: 1   hydrOXYzine (ATARAX/VISTARIL) 25 MG tablet, Take 1 tablet by mouth at bedtime as needed. 1-2 tablets at bedtime prn, Disp: , Rfl:    insulin lispro (HUMALOG KWIKPEN) 200 UNIT/ML KwikPen, Inject under skin 8-10 units 15 min before the 3 main meals, Disp: 9 mL, Rfl: 3   Lancets (ACCU-CHEK SOFT TOUCH) lancets, Check blood sugars 3 times daily E11.65, Disp: 100 each, Rfl:  12   latanoprost (XALATAN) 0.005 % ophthalmic solution, Place 1 drop into both eyes at bedtime. , Disp: , Rfl:    meclizine (ANTIVERT) 12.5 MG tablet, Take 1 tablet (12.5 mg total) by mouth 3 (three) times daily as needed for dizziness., Disp: 30 tablet, Rfl: 0   metoprolol succinate (TOPROL-XL) 25 MG 24 hr tablet, TAKE 1 TABLET BY MOUTH EVERY DAY, Disp: 90 tablet, Rfl: 1   Multiple Vitamin (MULTIVITAMIN) tablet, Take 1 tablet by mouth daily., Disp: , Rfl:    rosuvastatin (CRESTOR) 10 MG tablet, TAKE 1 TABLET BY MOUTH EVERY DAY, Disp: 90 tablet, Rfl: 1   Semaglutide, 1 MG/DOSE, (OZEMPIC, 1 MG/DOSE,) 2 MG/1.5ML SOPN, Inject 1 mg into the skin once a week.,  Disp: 6 pen, Rfl: 3   telmisartan-hydrochlorothiazide (MICARDIS HCT) 80-25 MG tablet, TAKE 1 TABLET BY MOUTH EVERY DAY, Disp: 90 tablet, Rfl: 1   TRESIBA FLEXTOUCH 200 UNIT/ML FlexTouch Pen, INJECT 28 UNITS INTO THE SKIN AT BEDTIME, Disp: 15 mL, Rfl: 1   Allergies  Allergen Reactions   Brimonidine Other (See Comments)    Eyes swell and itch   Dorzolamide     Other reaction(s): Other (See Comments) Red, inflamed eyelid, dermatitis    Timolol Other (See Comments)    Caused eyes to swell and itch     Review of Systems  Constitutional: Negative.  Negative for fatigue.  HENT:  Positive for hearing loss (daughter feels her hearing is better since being treated for sinus congestion). Negative for congestion and sore throat.   Respiratory: Negative.    Cardiovascular: Negative.  Negative for chest pain, palpitations and leg swelling.  Gastrointestinal: Negative.  Negative for abdominal pain, anorexia, nausea and vomiting.  Neurological:  Positive for dizziness. Negative for vertigo, weakness and headaches.  Psychiatric/Behavioral: Negative.      Today's Vitals   11/08/20 0900  BP: (!) 142/70  Pulse: 62  Temp: 97.9 F (36.6 C)  Weight: 160 lb 9.6 oz (72.8 kg)  Height: 5' 0.4" (1.534 m)  PainSc: 0-No pain   Body mass index is 30.95 kg/m.   Objective:  Physical Exam Vitals reviewed.  Constitutional:      General: She is not in acute distress.    Appearance: Normal appearance. She is well-developed. She is obese.  HENT:     Right Ear: Tympanic membrane, ear canal and external ear normal. There is no impacted cerumen.     Left Ear: Tympanic membrane, ear canal and external ear normal. There is no impacted cerumen.     Ears:     Comments: She has dried exudate that looks as though could be dried blood in ear canal Cardiovascular:     Rate and Rhythm: Normal rate and regular rhythm.     Pulses: Normal pulses.     Heart sounds: Normal heart sounds. No murmur heard. Pulmonary:      Effort: Pulmonary effort is normal. No respiratory distress.     Breath sounds: Normal breath sounds. No wheezing.  Chest:     Chest wall: No tenderness.  Skin:    General: Skin is warm and dry.     Capillary Refill: Capillary refill takes less than 2 seconds.  Neurological:     General: No focal deficit present.     Mental Status: She is alert and oriented to person, place, and time.     Cranial Nerves: No cranial nerve deficit.     Motor: No weakness.  Psychiatric:  Mood and Affect: Mood normal.        Behavior: Behavior normal.        Thought Content: Thought content normal.        Judgment: Judgment normal.        Assessment And Plan:     1. Dizziness [place]berg negative, she is doing better since being treated with Augmentin Will refer to ENT for further evaluation Orthostats -  Encouraged to drink at least 4-5 (12oz) water daily - Ambulatory referral to ENT  2. Sinus congestion Continue with flonase and zyrtec Likely had a sinus infection  She is also being referred to ENT  - cetirizine (ZYRTEC ALLERGY) 10 MG tablet; Take 1 tablet (10 mg total) by mouth daily.  Dispense: 90 tablet; Refill: 1  3. Excessive cerumen in ear canal, left Has cerumen but also has reddened exudate looks as though cold be dried blood.  - Ambulatory referral to ENT    Patient was given opportunity to ask questions. Patient verbalized understanding of the plan and was able to repeat key elements of the plan. All questions were answered to their satisfaction.  [name], FNP   I, [name], FNP, have reviewed all documentation for this visit. The documentation on 11/08/20 for the exam, diagnosis, procedures, and orders are all accurate and complete.   IF YOU HAVE BEEN REFERRED TO A SPECIALIST, IT MAY TAKE 1-2 WEEKS TO SCHEDULE/PROCESS THE REFERRAL. IF YOU HAVE NOT HEARD FROM US/SPECIALIST IN TWO WEEKS, PLEASE GIVE [place] A CALL AT [phone] X [phone].   THE PATIENT IS ENCOURAGED TO  PRACTICE SOCIAL DISTANCING DUE TO THE COVID-19 PANDEMIC.

## 2020-11-12 ENCOUNTER — Other Ambulatory Visit: Payer: Self-pay

## 2020-11-12 ENCOUNTER — Other Ambulatory Visit: Payer: Self-pay | Admitting: Nurse Practitioner

## 2020-11-12 ENCOUNTER — Ambulatory Visit
Admission: RE | Admit: 2020-11-12 | Discharge: 2020-11-12 | Disposition: A | Discharge status: Home / Self Care | Payer: Medicare Other | Source: Ambulatory Visit | Attending: Internal Medicine | Admitting: Internal Medicine

## 2020-11-12 DIAGNOSIS — Z1231 Encounter for screening mammogram for malignant neoplasm of breast: Secondary | ICD-10-CM | POA: Diagnosis not present

## 2020-11-12 DIAGNOSIS — R42 Dizziness and giddiness: Secondary | ICD-10-CM

## 2020-11-14 ENCOUNTER — Encounter: Payer: Self-pay | Admitting: Nurse Practitioner

## 2020-11-17 ENCOUNTER — Other Ambulatory Visit: Payer: Self-pay | Admitting: Internal Medicine

## 2020-11-18 ENCOUNTER — Other Ambulatory Visit: Payer: Self-pay | Admitting: Internal Medicine

## 2020-12-03 DIAGNOSIS — H5462 Unqualified visual loss, left eye, normal vision right eye: Secondary | ICD-10-CM | POA: Diagnosis not present

## 2020-12-03 DIAGNOSIS — H938X3 Other specified disorders of ear, bilateral: Secondary | ICD-10-CM | POA: Diagnosis not present

## 2020-12-03 DIAGNOSIS — R42 Dizziness and giddiness: Secondary | ICD-10-CM | POA: Diagnosis not present

## 2020-12-08 ENCOUNTER — Other Ambulatory Visit: Payer: Self-pay

## 2020-12-08 ENCOUNTER — Other Ambulatory Visit: Payer: Self-pay | Admitting: Nurse Practitioner

## 2020-12-08 DIAGNOSIS — R42 Dizziness and giddiness: Secondary | ICD-10-CM

## 2020-12-08 MED ORDER — MECLIZINE HCL 25 MG PO TABS: ORAL_TABLET | ORAL | 2 refills | Status: DC

## 2020-12-08 MED ORDER — MECLIZINE HCL 12.5 MG PO TABS
ORAL_TABLET | ORAL | 2 refills | Status: DC
Start: 2020-12-08 — End: 2020-12-08

## 2020-12-23 ENCOUNTER — Other Ambulatory Visit: Payer: Self-pay | Admitting: Internal Medicine

## 2020-12-23 DIAGNOSIS — H5462 Unqualified visual loss, left eye, normal vision right eye: Secondary | ICD-10-CM | POA: Diagnosis not present

## 2020-12-23 DIAGNOSIS — H4052X3 Glaucoma secondary to other eye disorders, left eye, severe stage: Secondary | ICD-10-CM | POA: Diagnosis not present

## 2020-12-23 DIAGNOSIS — H349 Unspecified retinal vascular occlusion: Secondary | ICD-10-CM | POA: Diagnosis not present

## 2020-12-23 DIAGNOSIS — H2102 Hyphema, left eye: Secondary | ICD-10-CM | POA: Diagnosis not present

## 2020-12-23 DIAGNOSIS — Z794 Long term (current) use of insulin: Secondary | ICD-10-CM | POA: Diagnosis not present

## 2020-12-23 DIAGNOSIS — Z7984 Long term (current) use of oral hypoglycemic drugs: Secondary | ICD-10-CM | POA: Diagnosis not present

## 2020-12-23 DIAGNOSIS — H401133 Primary open-angle glaucoma, bilateral, severe stage: Secondary | ICD-10-CM | POA: Diagnosis not present

## 2020-12-23 DIAGNOSIS — E1159 Type 2 diabetes mellitus with other circulatory complications: Secondary | ICD-10-CM

## 2020-12-23 DIAGNOSIS — E113311 Type 2 diabetes mellitus with moderate nonproliferative diabetic retinopathy with macular edema, right eye: Secondary | ICD-10-CM | POA: Diagnosis not present

## 2020-12-23 DIAGNOSIS — IMO0002 Reserved for concepts with insufficient information to code with codable children: Secondary | ICD-10-CM

## 2020-12-29 ENCOUNTER — Telehealth: Payer: Self-pay

## 2020-12-29 NOTE — Chronic Care Management (AMB) (Signed)
Chronic Care Management Pharmacy Assistant   Name: [name] [name]  MRN: [id number] DOB: 23-Dec-1940   Reason for Encounter: Disease State/ Diabetes  Recent office visits:  11-08-2020 [name] [name], [place]. START Zyrtec 10 mg. STOP Claritin and Augmentin. Referral placed for ENT.   Recent consult visits:  12-03-2020 [name] [name] [name] Earma [place], PA-C (ENT). Initial appointment for dizziness.  12-23-2020 [name] Larve, MD [facility]). Follow up  Hospital visits:  None in previous 6 months  Medications: Outpatient Encounter Medications as of 12/29/2020  Medication Sig   ACCU-CHEK AVIVA PLUS test strip TEST THREE TIMES DAILY AS DIRECTED   amLODipine (NORVASC) 10 MG tablet TAKE 1 TABLET BY MOUTH EVERY DAY   aspirin 325 MG EC tablet Take 325 mg by mouth daily.   B-D UF III MINI PEN NEEDLES 31G X 5 MM MISC Use as directed 4x a day   cetirizine (ZYRTEC ALLERGY) 10 MG tablet Take 1 tablet (10 mg total) by mouth daily.   fluticasone (FLONASE) 50 MCG/ACT nasal spray Place 2 sprays into both nostrils daily.   glipiZIDE (GLUCOTROL XL) 2.5 MG 24 hr tablet Take 5 mg by mouth every morning.   hydrALAZINE (APRESOLINE) 25 MG tablet TAKE 1 TABLET(25 MG) BY MOUTH TWICE DAILY   hydrOXYzine (ATARAX/VISTARIL) 25 MG tablet Take 1 tablet by mouth at bedtime as needed. 1-2 tablets at bedtime prn   insulin lispro (HUMALOG KWIKPEN) 200 UNIT/ML KwikPen Inject under skin 8-10 units 15 min before the 3 main meals   Lancets (ACCU-CHEK SOFT TOUCH) lancets Check blood sugars 3 times daily E11.65   latanoprost (XALATAN) 0.005 % ophthalmic solution Place 1 drop into both eyes at bedtime.    meclizine (ANTIVERT) 25 MG tablet TAKE 1 TABLET BY MOUTH THREE TIMES DAILY AS NEEDED FOR DIZZINESS   metoprolol succinate (TOPROL-XL) 25 MG 24 hr tablet TAKE 1 TABLET BY MOUTH EVERY DAY   Multiple Vitamin (MULTIVITAMIN) tablet Take 1 tablet by mouth daily.   rosuvastatin (CRESTOR) 10 MG tablet TAKE 1 TABLET BY  MOUTH EVERY DAY   Semaglutide, 1 MG/DOSE, (OZEMPIC, 1 MG/DOSE,) 2 MG/1.5ML SOPN Inject 1 mg into the skin once a week.   telmisartan-hydrochlorothiazide (MICARDIS HCT) 80-25 MG tablet TAKE 1 TABLET BY MOUTH EVERY DAY   TRESIBA FLEXTOUCH 200 UNIT/ML FlexTouch Pen INJECT 28 UNITS INTO THE SKIN AT BEDTIME   No facility-administered encounter medications on file as of 12/29/2020.   Recent Relevant Labs: Lab Results  Component Value Date/Time   HGBA1C 10.0 (H) 09/09/2020 12:15 PM   HGBA1C 10.4 (H) 03/15/2020 11:49 AM   MICROALBUR 150 09/09/2020 12:43 PM   MICROALBUR 150 09/04/2019 02:19 PM    Kidney Function Lab Results  Component Value Date/Time   CREATININE 2.37 (H) 09/09/2020 12:15 PM   CREATININE 2.55 (H) 03/15/2020 11:49 AM   CREATININE 1.95 (H) 10/09/2018 09:05 AM   GFRNONAA 17 (L) 03/15/2020 11:49 AM   GFRNONAA 24 (L) 10/09/2018 09:05 AM   GFRAA 20 (L) 03/15/2020 11:49 AM   GFRAA 27 (L) 10/09/2018 09:05 AM    Current antihyperglycemic regimen:  Ozempic '1mg'$  weekly Tresiba Flextouch 200 unitl - 28 units into the skin at bedtime Humalog- inject 8-10 units 15 minutes before the 3 main meals.  Glipizide 2.5 mg tablet once per day   What recent interventions/DTPs have been made to improve glycemic control:  Educated on Exercise goal of 150 minutes per week Goal Ms. Steenhoek is going to start eating whole fruit and cut back on  fruit cups  Counseled to check feet daily and get yearly eye exams   Have there been any recent hospitalizations or ED visits since last visit with CPP? No  Patient denies hypoglycemic symptoms  Patient denies hyperglycemic symptoms  How often are you checking your blood sugar? once daily  What are your blood sugars ranging?  Fasting: 119 Before meals: None After meals: None Bedtime: None  During the week, how often does your blood glucose drop below 70? Never  Are you checking your feet daily/regularly? Patient states daily  Adherence  Review: Is the patient currently on a STATIN medication? Yes Is the patient currently on ACE/ARB medication? Yes Does the patient have >5 day gap between last estimated fill dates? Yes  NOTES: Patient states she is doing great and has plenty supply of ozempic.  Care Gaps: Tdap overdue Yearly foot exam overdue Covid booster overdue AWV 10-06-2021  Star Rating Drugs: Ozempic 1 mg- Patient assistance Telmisartan-HCTZ 80-25 mg- Last filled 11-19-2020 90 DS [facility] Rosuvastatin 10 mg- Last filled 11-17-2020 90 DS [facility] Glipizide 2.5 mg- Last filled 11-17-2020 90 DS [place] Clinical Pharmacist Assistant [phone]

## 2020-12-30 DIAGNOSIS — L298 Other pruritus: Secondary | ICD-10-CM | POA: Diagnosis not present

## 2020-12-30 DIAGNOSIS — L718 Other rosacea: Secondary | ICD-10-CM | POA: Diagnosis not present

## 2021-01-11 ENCOUNTER — Encounter: Payer: Medicare Other | Admitting: Internal Medicine

## 2021-01-14 DIAGNOSIS — H903 Sensorineural hearing loss, bilateral: Secondary | ICD-10-CM | POA: Diagnosis not present

## 2021-01-14 DIAGNOSIS — H8112 Benign paroxysmal vertigo, left ear: Secondary | ICD-10-CM | POA: Diagnosis not present

## 2021-01-24 DIAGNOSIS — H8112 Benign paroxysmal vertigo, left ear: Secondary | ICD-10-CM | POA: Insufficient documentation

## 2021-01-24 DIAGNOSIS — H6981 Other specified disorders of Eustachian tube, right ear: Secondary | ICD-10-CM | POA: Diagnosis not present

## 2021-01-24 DIAGNOSIS — H903 Sensorineural hearing loss, bilateral: Secondary | ICD-10-CM

## 2021-01-24 DIAGNOSIS — H6991 Unspecified Eustachian tube disorder, right ear: Secondary | ICD-10-CM | POA: Insufficient documentation

## 2021-01-24 HISTORY — DX: Benign paroxysmal vertigo, left ear: H81.12

## 2021-01-24 HISTORY — DX: Sensorineural hearing loss, bilateral: H90.3

## 2021-02-07 DIAGNOSIS — R42 Dizziness and giddiness: Secondary | ICD-10-CM | POA: Diagnosis not present

## 2021-02-15 ENCOUNTER — Other Ambulatory Visit: Payer: Self-pay | Admitting: Internal Medicine

## 2021-02-15 ENCOUNTER — Telehealth: Payer: Self-pay

## 2021-02-15 NOTE — Chronic Care Management (AMB) (Signed)
02/15/2021- Called patient in inquire if she was still in need of assistance of [place] and Ozempic with [employer] patient assistance program. Patient voiced that she still needs assistance, patient aware [id number] application is being placed in the mail and requested patient to return to PCP office once signed and income attached.   Pattricia Boss, [place] Pharmacist Assistant [phone]

## 2021-02-17 ENCOUNTER — Other Ambulatory Visit: Payer: Self-pay

## 2021-02-17 ENCOUNTER — Encounter: Payer: Self-pay | Admitting: Internal Medicine

## 2021-02-17 ENCOUNTER — Ambulatory Visit (INDEPENDENT_AMBULATORY_CARE_PROVIDER_SITE_OTHER): Payer: Medicare Other | Admitting: Internal Medicine

## 2021-02-17 VITALS — BP 126/70 | HR 68 | Temp 98.0°F | Ht 60.4 in | Wt 168.2 lb

## 2021-02-17 DIAGNOSIS — R062 Wheezing: Secondary | ICD-10-CM | POA: Diagnosis not present

## 2021-02-17 DIAGNOSIS — R142 Eructation: Secondary | ICD-10-CM

## 2021-02-17 DIAGNOSIS — R5383 Other fatigue: Secondary | ICD-10-CM

## 2021-02-17 DIAGNOSIS — N1832 Chronic kidney disease, stage 3b: Secondary | ICD-10-CM

## 2021-02-17 DIAGNOSIS — E11319 Type 2 diabetes mellitus with unspecified diabetic retinopathy without macular edema: Secondary | ICD-10-CM

## 2021-02-17 DIAGNOSIS — R42 Dizziness and giddiness: Secondary | ICD-10-CM

## 2021-02-17 DIAGNOSIS — R0609 Other forms of dyspnea: Secondary | ICD-10-CM | POA: Diagnosis not present

## 2021-02-17 DIAGNOSIS — R0683 Snoring: Secondary | ICD-10-CM

## 2021-02-17 DIAGNOSIS — I129 Hypertensive chronic kidney disease with stage 1 through stage 4 chronic kidney disease, or unspecified chronic kidney disease: Secondary | ICD-10-CM | POA: Diagnosis not present

## 2021-02-17 DIAGNOSIS — J3089 Other allergic rhinitis: Secondary | ICD-10-CM | POA: Diagnosis not present

## 2021-02-17 NOTE — Patient Instructions (Signed)
Vertigo Vertigo is the feeling that you or the things around you are moving when they are not. This feeling can come and go at any time. Vertigo often goes away on its own. This condition can be dangerous if it happens when you are doingactivities like driving or working with machines. Your doctor will do tests to find the cause of your vertigo. These tests willalso help your doctor decide on the best treatment for you. Follow these instructions at home: Eating and drinking     Drink enough fluid to keep your pee (urine) pale yellow. Do not drink alcohol. Activity Return to your normal activities when your doctor says that it is safe. In the morning, first sit up on the side of the bed. When you feel okay, stand slowly while you hold onto something until you know that your balance is fine. Move slowly. Avoid sudden body or head movements or certain positions, as told by your doctor. Use a cane if you have trouble standing or walking. Sit down right away if you feel dizzy. Avoid doing any tasks or activities that can cause danger to you or others if you get dizzy. Avoid bending down if you feel dizzy. Place items in your home so that they are easy for you to reach without bending or leaning over. Do not drive or use machinery if you feel dizzy. General instructions Take over-the-counter and prescription medicines only as told by your doctor. Keep all follow-up visits. Contact a doctor if: Your medicine does not help your vertigo. Your problems get worse or you have new symptoms. You have a fever. You feel like you may vomit (nauseous), or this feeling gets worse. You start to vomit. Your family or friends see changes in how you act. You lose feeling (have numbness) in part of your body. You feel prickling and tingling in a part of your body. Get help right away if: You are always dizzy. You faint. You get very bad headaches. You get a stiff neck. Bright light starts to bother  you. You have trouble moving or talking. You feel weak in your hands, arms, or legs. You have changes in your hearing or in how you see (vision). These symptoms may be an emergency. Get help right away. Call your local emergency services (911 in the [place] Do not wait to see if the symptoms will go away. Do not drive yourself to the hospital. Summary Vertigo is the feeling that you or the things around you are moving when they are not. Your doctor will do tests to find the cause of your vertigo. You may be told to avoid some tasks, positions, or movements. Contact a doctor if your medicine is not helping, or if you have a fever, new symptoms, or a change in how you act. Get help right away if you get very bad headaches, or if you have changes in how you speak, hear, or see. This information is not intended to replace advice given to you by your health care provider. Make sure you discuss any questions you have with your healthcare provider. Document Revised: 02/25/2020 Document Reviewed: 02/25/2020 Elsevier Patient Education  2022 Elsevier Inc.  

## 2021-02-17 NOTE — Progress Notes (Signed)
Rich Brave [place],acting as a Education administrator for Maximino Greenland, MD.,have documented all relevant documentation on the behalf of Maximino Greenland, MD,as directed by  Maximino Greenland, MD while in the presence of Maximino Greenland, MD.  This visit occurred during the SARS-CoV-2 public health emergency.  Safety protocols were in place, including screening questions prior to the visit, additional usage of staff PPE, and extensive cleaning of exam room while observing appropriate contact time as indicated for disinfecting solutions.  Subjective:     Patient ID: [name] [name] , female    DOB: 23-Sep-1940 , 80 y.o.   MRN: [id number]   Chief Complaint  Patient presents with   Dizziness   Wheezing    HPI  Patient presents today for an eval on her vertigo and wheezing. She is accompanied by her daughter, [name] [name]. The daughter stated she has been seen by the ENT specialist for her vertigo. The vertigo was caused by an ear infection. Pt was prescribed abx, she reports she completed the full course. The daughter reported she is now referred pt to Physical therapy. Ms.Gartley reports that her vertigo has improved since starting PT.   The daughter also reports she feels that the patient is wheezing and it has gotten worse. Pt denies SOB w/ exertion, but states she gets "tired" when walking to mailbox and taking out the trash. She takes care of most household duties. Denies having any chest pain and diaphoresis.   Dizziness This is a recurrent problem. The current episode started more than 1 month ago. The problem occurs intermittently. The problem has been gradually improving. Associated symptoms include fatigue. Pertinent negatives include no coughing.  Wheezing  This is a new problem. The current episode started more than 1 month ago. The problem has been gradually worsening. Pertinent negatives include no coughing or shortness of breath. Nothing aggravates the symptoms. Treatments tried: claritin and nasal  spray.    Past Medical History:  Diagnosis Date   Diabetes mellitus ([place])    H/O: CVA (cerebrovascular accident) 2002   HTN (hypertension)    Hyperlipidemia    Pulmonary HTN ([facility])    mild by echo     Family History  Problem Relation Age of Onset   Hypertension Mother    Hypertension Father    Heart Problems Father    Hypertension Sister    Diabetes Sister    Hypertension Brother    Diabetes Brother      Current Outpatient Medications:    ACCU-CHEK AVIVA PLUS test strip, TEST THREE TIMES DAILY AS DIRECTED, Disp: 100 strip, Rfl: 11   amLODipine (NORVASC) 10 MG tablet, TAKE 1 TABLET BY MOUTH EVERY DAY, Disp: 90 tablet, Rfl: 1   aspirin 325 MG EC tablet, Take 325 mg by mouth daily., Disp: , Rfl:    B-D UF III MINI PEN NEEDLES 31G X 5 MM MISC, Use as directed 4x a day, Disp: 300 each, Rfl: 3   cetirizine (ZYRTEC ALLERGY) 10 MG tablet, Take 1 tablet (10 mg total) by mouth daily., Disp: 90 tablet, Rfl: 1   fluticasone (FLONASE) 50 MCG/ACT nasal spray, Place 2 sprays into both nostrils daily., Disp: 18.2 mL, Rfl: 2   glipiZIDE (GLUCOTROL XL) 2.5 MG 24 hr tablet, Take 5 mg by mouth every morning., Disp: , Rfl:    hydrALAZINE (APRESOLINE) 25 MG tablet, TAKE 1 TABLET(25 MG) BY MOUTH TWICE DAILY, Disp: 180 tablet, Rfl: 1   hydrOXYzine (ATARAX/VISTARIL) 25 MG tablet, Take 1 tablet by mouth  at bedtime as needed. 1-2 tablets at bedtime prn, Disp: , Rfl:    insulin lispro (HUMALOG KWIKPEN) 200 UNIT/ML KwikPen, Inject under skin 8-10 units 15 min before the 3 main meals, Disp: 9 mL, Rfl: 3   Lancets (ACCU-CHEK SOFT TOUCH) lancets, Check blood sugars 3 times daily E11.65, Disp: 100 each, Rfl: 12   latanoprost (XALATAN) 0.005 % ophthalmic solution, Place 1 drop into both eyes at bedtime. , Disp: , Rfl:    metoprolol succinate (TOPROL-XL) 25 MG 24 hr tablet, TAKE 1 TABLET BY MOUTH EVERY DAY, Disp: 90 tablet, Rfl: 1   Multiple Vitamin (MULTIVITAMIN) tablet, Take 1 tablet by mouth daily., Disp: ,  Rfl:    rosuvastatin (CRESTOR) 10 MG tablet, TAKE 1 TABLET BY MOUTH EVERY DAY, Disp: 90 tablet, Rfl: 1   Semaglutide, 1 MG/DOSE, (OZEMPIC, 1 MG/DOSE,) 2 MG/1.5ML SOPN, Inject 1 mg into the skin once a week., Disp: 6 pen, Rfl: 3   telmisartan-hydrochlorothiazide (MICARDIS HCT) 80-25 MG tablet, TAKE 1 TABLET BY MOUTH EVERY DAY, Disp: 90 tablet, Rfl: 1   TRESIBA FLEXTOUCH 200 UNIT/ML FlexTouch Pen, INJECT 28 UNITS INTO THE SKIN AT BEDTIME, Disp: 15 mL, Rfl: 1   meclizine (ANTIVERT) 25 MG tablet, TAKE 1 TABLET BY MOUTH THREE TIMES DAILY AS NEEDED FOR DIZZINESS (Patient not taking: Reported on 02/17/2021), Disp: 30 tablet, Rfl: 2   Allergies  Allergen Reactions   Brimonidine Other (See Comments)    Eyes swell and itch   Dorzolamide     Other reaction(s): Other (See Comments) Red, inflamed eyelid, dermatitis    Timolol Other (See Comments)    Caused eyes to swell and itch     Review of Systems  Constitutional:  Positive for fatigue.  Respiratory:  Positive for wheezing. Negative for cough and shortness of breath.   Cardiovascular: Negative.   Gastrointestinal: Negative.   Neurological:  Positive for dizziness.  Psychiatric/Behavioral: Negative.      Today's Vitals   02/17/21 1616  BP: 126/70  Pulse: 68  Temp: 98 F (36.7 C)  Weight: 168 lb 3.2 oz (76.3 kg)  Height: 5' 0.4" (1.534 m)  PainSc: 0-No pain   Body mass index is 32.42 kg/m.  Wt Readings from Last 3 Encounters:  02/17/21 168 lb 3.2 oz (76.3 kg)  11/08/20 160 lb 9.6 oz (72.8 kg)  10/21/20 158 lb 9.6 oz (71.9 kg)     Objective:  Physical Exam Vitals and nursing note reviewed.  Constitutional:      Appearance: Normal appearance.  HENT:     Head: Normocephalic and atraumatic.     Nose:     Comments: Masked     Mouth/Throat:     Comments: Masked  Eyes:     Extraocular Movements: Extraocular movements intact.  Cardiovascular:     Rate and Rhythm: Normal rate and regular rhythm.     Heart sounds: Normal heart  sounds.  Pulmonary:     Effort: Pulmonary effort is normal.     Breath sounds: Normal breath sounds.  Musculoskeletal:     Cervical back: Normal range of motion.     Comments: Trace b/l LE edema  Skin:    General: Skin is warm.  Neurological:     General: No focal deficit present.     Mental Status: She is alert.  Psychiatric:        Mood and Affect: Mood normal.        Behavior: Behavior normal.        Assessment  And Plan:     1. Vertigo Comments: As per ENT. Care Everywhere notes reviewed.   2. Wheezing Comments: Not present on exam today.   3. Fatigue, unspecified type Comments: Due to cardiac risk factors, EKG performed -- NSR w/ LBBB - not a new finding.  This is also a common sx in those with stage 4 CKD.  She is  encouraged to stay well hydrated.  - EKG 12-Lead - TSH  4. Dyspnea on exertion Comments: I will schedule her for 2d echocardiogram. Due to her cardiac risk factors, I  will also refer her for Cardiology evaluation. - Brain natriuretic peptide (99242) - Ambulatory referral to Cardiology - ECHOCARDIOGRAM COMPLETE; Future  5. Seasonal allergic rhinitis due to other allergic trigger Comments: She will c/w loratadine and fluticasone NS.   6. Diabetic retinopathy associated with type 2 diabetes mellitus, macular edema presence unspecified, unspecified laterality, unspecified retinopathy severity ([place]) Comments: Chronic, I will check labs as listed below. She is encouraged to f/u with Endo as scheduled. Importance of dietary compliance was d/w patient.  - CMP14+EGFR - Hemoglobin A1c - TSH - Lipid panel  7. Hypertensive nephropathy Comments: Chronic, well controlled. She is encouraged to follow low sodium diet. She will f/u in 3-4 months for re-evaluation.  - CMP14+EGFR - CBC no Diff - TSH - Lipid panel   Patient was given opportunity to ask questions. Patient verbalized understanding of the plan and was able to repeat key elements of the plan. All  questions were answered to their satisfaction.   I, Maximino Greenland, MD, have reviewed all documentation for this visit. The documentation on 02/17/21 for the exam, diagnosis, procedures, and orders are all accurate and complete.   IF YOU HAVE BEEN REFERRED TO A SPECIALIST, IT MAY TAKE 1-2 WEEKS TO SCHEDULE/PROCESS THE REFERRAL. IF YOU HAVE NOT HEARD FROM US/SPECIALIST IN TWO WEEKS, PLEASE GIVE [place] A CALL AT [phone] X [phone].   THE PATIENT IS ENCOURAGED TO PRACTICE SOCIAL DISTANCING DUE TO THE COVID-19 PANDEMIC.

## 2021-02-18 DIAGNOSIS — R42 Dizziness and giddiness: Secondary | ICD-10-CM | POA: Diagnosis not present

## 2021-02-18 LAB — CMP14+EGFR
ALT: 17 IU/L (ref 0–32)
AST: 21 IU/L (ref 0–40)
Albumin/Globulin Ratio: 1.4 (ref 1.2–2.2)
Albumin: 4.1 g/dL (ref 3.6–4.6)
Alkaline Phosphatase: 108 IU/L (ref 44–121)
BUN/Creatinine Ratio: 22 (ref 12–28)
BUN: 61 mg/dL — ABNORMAL HIGH (ref 8–27)
Bilirubin Total: <0.2 mg/dL (ref 0.0–1.2)
CO2: 22 mmol/L (ref 20–29)
Calcium: 9.1 mg/dL (ref 8.7–10.3)
Chloride: 101 mmol/L (ref 96–106)
Creatinine, Ser: 2.77 mg/dL — ABNORMAL HIGH (ref 0.57–1.00)
Globulin, Total: 2.9 g/dL (ref 1.5–4.5)
Glucose: 252 mg/dL — ABNORMAL HIGH (ref 70–99)
Potassium: 4.1 mmol/L (ref 3.5–5.2)
Sodium: 137 mmol/L (ref 134–144)
Total Protein: 7 g/dL (ref 6.0–8.5)
eGFR: 17 mL/min/{1.73_m2} — ABNORMAL LOW (ref >59)

## 2021-02-18 LAB — LIPID PANEL
Chol/HDL Ratio: 4.3 ratio (ref 0.0–4.4)
Cholesterol, Total: 169 mg/dL (ref 100–199)
HDL: 39 mg/dL — ABNORMAL LOW (ref >39)
LDL Chol Calc (NIH): 97 mg/dL (ref 0–99)
Triglycerides: 190 mg/dL — ABNORMAL HIGH (ref 0–149)
VLDL Cholesterol Cal: 33 mg/dL (ref 5–40)

## 2021-02-18 LAB — CBC
Hematocrit: 31.1 % — ABNORMAL LOW (ref 34.0–46.6)
Hemoglobin: 10 g/dL — ABNORMAL LOW (ref 11.1–15.9)
MCH: 27.9 pg (ref 26.6–33.0)
MCHC: 32.2 g/dL (ref 31.5–35.7)
MCV: 87 fL (ref 79–97)
Platelets: 351 10*3/uL (ref 150–450)
RBC: 3.59 x10E6/uL — ABNORMAL LOW (ref 3.77–5.28)
RDW: 13.1 % (ref 11.7–15.4)
WBC: 10.6 10*3/uL (ref 3.4–10.8)

## 2021-02-18 LAB — HEMOGLOBIN A1C
Est. average glucose Bld gHb Est-mCnc: 255 mg/dL
Hgb A1c MFr Bld: 10.5 % — ABNORMAL HIGH (ref 4.8–5.6)

## 2021-02-18 LAB — BRAIN NATRIURETIC PEPTIDE: BNP: 125.6 pg/mL — ABNORMAL HIGH (ref 0.0–100.0)

## 2021-02-18 LAB — TSH: TSH: 1.52 u[IU]/mL (ref 0.450–4.500)

## 2021-02-27 DIAGNOSIS — R062 Wheezing: Secondary | ICD-10-CM | POA: Insufficient documentation

## 2021-02-27 DIAGNOSIS — R42 Dizziness and giddiness: Secondary | ICD-10-CM | POA: Insufficient documentation

## 2021-03-07 DIAGNOSIS — E1165 Type 2 diabetes mellitus with hyperglycemia: Secondary | ICD-10-CM | POA: Diagnosis not present

## 2021-03-07 DIAGNOSIS — J069 Acute upper respiratory infection, unspecified: Secondary | ICD-10-CM | POA: Diagnosis not present

## 2021-03-07 DIAGNOSIS — I10 Essential (primary) hypertension: Secondary | ICD-10-CM | POA: Diagnosis not present

## 2021-03-08 ENCOUNTER — Ambulatory Visit (INDEPENDENT_AMBULATORY_CARE_PROVIDER_SITE_OTHER): Payer: Medicare Other | Admitting: Internal Medicine

## 2021-03-08 ENCOUNTER — Other Ambulatory Visit: Payer: Self-pay

## 2021-03-08 ENCOUNTER — Encounter: Payer: Self-pay | Admitting: Internal Medicine

## 2021-03-08 VITALS — BP 122/70 | HR 85 | Temp 98.5°F | Ht 60.4 in | Wt 152.6 lb

## 2021-03-08 DIAGNOSIS — E11319 Type 2 diabetes mellitus with unspecified diabetic retinopathy without macular edema: Secondary | ICD-10-CM | POA: Diagnosis not present

## 2021-03-08 DIAGNOSIS — E663 Overweight: Secondary | ICD-10-CM

## 2021-03-08 DIAGNOSIS — I129 Hypertensive chronic kidney disease with stage 1 through stage 4 chronic kidney disease, or unspecified chronic kidney disease: Secondary | ICD-10-CM

## 2021-03-08 DIAGNOSIS — N184 Chronic kidney disease, stage 4 (severe): Secondary | ICD-10-CM | POA: Diagnosis not present

## 2021-03-08 DIAGNOSIS — Z0000 Encounter for general adult medical examination without abnormal findings: Secondary | ICD-10-CM

## 2021-03-08 MED ORDER — TELMISARTAN-HCTZ 80-25 MG PO TABS: 1.0000 | ORAL_TABLET | Freq: Every day | ORAL | 1 refills | Status: DC

## 2021-03-08 MED ORDER — TETANUS-DIPHTH-ACELL PERTUSSIS 5-2.5-18.5 LF-MCG/0.5 IM SUSP: 0.5000 mL | Freq: Once | INTRAMUSCULAR | 0 refills | Status: AC

## 2021-03-08 MED ORDER — TRUE METRIX BLOOD GLUCOSE TEST VI STRP: ORAL_STRIP | 12 refills | Status: DC

## 2021-03-08 MED ORDER — TRUE METRIX METER DEVI: 3 refills | Status: DC

## 2021-03-08 MED ORDER — BLOOD GLUCOSE METER KIT: PACK | 0 refills | Status: DC

## 2021-03-08 NOTE — Progress Notes (Signed)
I,Tianna Badgett,acting as a Education administrator for Maximino Greenland, MD.,have documented all relevant documentation on the behalf of Maximino Greenland, MD,as directed by  Maximino Greenland, MD while in the presence of Maximino Greenland, MD.  This visit occurred during the SARS-CoV-2 public health emergency.  Safety protocols were in place, including screening questions prior to the visit, additional usage of staff PPE, and extensive cleaning of exam room while observing appropriate contact time as indicated for disinfecting solutions.  Subjective:     Patient ID: [name] [name] , female    DOB: 04-16-1940 , 80 y.o.   MRN: [id number]   Chief Complaint  Patient presents with   Annual Exam   Diabetes   Hypertension    HPI  She presents today for physical exam. She states that she is compliant with medications. She has no concerns at this time.   Diabetes She presents for her follow-up diabetic visit. She has type 2 diabetes mellitus. Pertinent negatives for diabetes include no blurred vision and no chest pain. Risk factors for coronary artery disease include diabetes mellitus, dyslipidemia, hypertension, post-menopausal and sedentary lifestyle. Current diabetic treatment includes insulin injections and oral agent (monotherapy). She participates in exercise intermittently. An ACE inhibitor/angiotensin II receptor blocker is being taken. Eye exam is current.  Hypertension This is a chronic problem. The current episode started more than 1 year ago. The problem has been gradually improving since onset. The problem is controlled. Pertinent negatives include no blurred vision, chest pain, palpitations or shortness of breath. Risk factors for coronary artery disease include diabetes mellitus, dyslipidemia, post-menopausal state and sedentary lifestyle. The current treatment provides moderate improvement. Compliance problems include exercise.  Hypertensive end-organ damage includes kidney disease.    Past Medical  History:  Diagnosis Date   Diabetes mellitus ([place])    H/O: CVA (cerebrovascular accident) 2002   HTN (hypertension)    Hyperlipidemia    Pulmonary HTN ([facility])    mild by echo     Family History  Problem Relation Age of Onset   Hypertension Mother    Hypertension Father    Heart Problems Father    Hypertension Sister    Diabetes Sister    Hypertension Brother    Diabetes Brother      Current Outpatient Medications:    benzonatate (TESSALON) 100 MG capsule, Take by mouth 3 (three) times daily as needed for cough., Disp: , Rfl:    Blood Glucose Monitoring Suppl (TRUE METRIX METER) DEVI, Use to check blood sugars 3 times a day., Disp: 1 each, Rfl: 3   glucose blood (TRUE METRIX BLOOD GLUCOSE TEST) test strip, Use as instructed to check blood sugar 3 times a day. Dx code e11.65, Disp: 100 each, Rfl: 12   ACCU-CHEK AVIVA PLUS test strip, TEST THREE TIMES DAILY AS DIRECTED, Disp: 100 strip, Rfl: 11   amLODipine (NORVASC) 10 MG tablet, TAKE 1 TABLET BY MOUTH EVERY DAY, Disp: 90 tablet, Rfl: 1   aspirin 325 MG EC tablet, Take 325 mg by mouth daily., Disp: , Rfl:    azithromycin (ZITHROMAX) 250 MG tablet, Take 250 mg by mouth as directed., Disp: , Rfl:    B-D UF III MINI PEN NEEDLES 31G X 5 MM MISC, Use as directed 4x a day, Disp: 300 each, Rfl: 3   blood glucose meter kit and supplies, Dispense based on patient and insurance preference. Use up to four times daily as directed.dx code e11.65, Disp: 1 each, Rfl: 0   cetirizine (ZYRTEC  ALLERGY) 10 MG tablet, Take 1 tablet (10 mg total) by mouth daily., Disp: 90 tablet, Rfl: 1   fluticasone (FLONASE) 50 MCG/ACT nasal spray, Place 2 sprays into both nostrils daily., Disp: 18.2 mL, Rfl: 2   glipiZIDE (GLUCOTROL XL) 2.5 MG 24 hr tablet, Take 5 mg by mouth every morning., Disp: , Rfl:    hydrALAZINE (APRESOLINE) 25 MG tablet, TAKE 1 TABLET(25 MG) BY MOUTH TWICE DAILY, Disp: 180 tablet, Rfl: 1   hydrOXYzine (ATARAX/VISTARIL) 25 MG tablet, Take 1  tablet by mouth at bedtime as needed. 1-2 tablets at bedtime prn, Disp: , Rfl:    insulin lispro (HUMALOG KWIKPEN) 200 UNIT/ML KwikPen, Inject under skin 8-10 units 15 min before the 3 main meals, Disp: 9 mL, Rfl: 3   Lancets (ACCU-CHEK SOFT TOUCH) lancets, Check blood sugars 3 times daily E11.65, Disp: 100 each, Rfl: 12   latanoprost (XALATAN) 0.005 % ophthalmic solution, Place 1 drop into both eyes at bedtime. , Disp: , Rfl:    meclizine (ANTIVERT) 25 MG tablet, TAKE 1 TABLET BY MOUTH THREE TIMES DAILY AS NEEDED FOR DIZZINESS (Patient not taking: Reported on 02/17/2021), Disp: 30 tablet, Rfl: 2   metoprolol succinate (TOPROL-XL) 25 MG 24 hr tablet, TAKE 1 TABLET BY MOUTH EVERY DAY, Disp: 90 tablet, Rfl: 1   Multiple Vitamin (MULTIVITAMIN) tablet, Take 1 tablet by mouth daily., Disp: , Rfl:    rosuvastatin (CRESTOR) 10 MG tablet, TAKE 1 TABLET BY MOUTH EVERY DAY, Disp: 90 tablet, Rfl: 1   Semaglutide, 1 MG/DOSE, (OZEMPIC, 1 MG/DOSE,) 2 MG/1.5ML SOPN, Inject 1 mg into the skin once a week., Disp: 6 pen, Rfl: 3   telmisartan-hydrochlorothiazide (MICARDIS HCT) 80-25 MG tablet, Take 1 tablet by mouth daily., Disp: 90 tablet, Rfl: 1   TRESIBA FLEXTOUCH 200 UNIT/ML FlexTouch Pen, INJECT 28 UNITS INTO THE SKIN AT BEDTIME (Patient taking differently: Inject 30 Units into the skin at bedtime.), Disp: 15 mL, Rfl: 1   Allergies  Allergen Reactions   Brimonidine Other (See Comments)    Eyes swell and itch   Dorzolamide     Other reaction(s): Other (See Comments) Red, inflamed eyelid, dermatitis    Timolol Other (See Comments)    Caused eyes to swell and itch     Review of Systems  Constitutional: Negative.   HENT: Negative.    Eyes: Negative.  Negative for blurred vision.  Respiratory: Negative.  Negative for shortness of breath.   Cardiovascular: Negative.  Negative for chest pain and palpitations.  Gastrointestinal: Negative.   Endocrine: Negative.   Genitourinary: Negative.   Musculoskeletal:  Negative.   Skin: Negative.   Allergic/Immunologic: Negative.   Neurological: Negative.   Hematological: Negative.   Psychiatric/Behavioral: Negative.      Today's Vitals   03/08/21 1107  BP: 122/70  Pulse: 85  Temp: 98.5 F (36.9 C)  TempSrc: Oral  Weight: 152 lb 9.6 oz (69.2 kg)  Height: 5' 0.4" (1.534 m)   Body mass index is 29.41 kg/m.  Wt Readings from Last 3 Encounters:  03/08/21 152 lb 9.6 oz (69.2 kg)  02/17/21 168 lb 3.2 oz (76.3 kg)  11/08/20 160 lb 9.6 oz (72.8 kg)    Objective:  Physical Exam Vitals and nursing note reviewed.  Constitutional:      Appearance: Normal appearance.  HENT:     Head: Normocephalic and atraumatic.     Right Ear: Tympanic membrane, ear canal and external ear normal.     Left Ear: Tympanic membrane, ear canal  and external ear normal.     Nose:     Comments: Masked     Mouth/Throat:     Comments: Masked  Eyes:     Extraocular Movements: Extraocular movements intact.     Conjunctiva/sclera: Conjunctivae normal.     Pupils: Pupils are equal, round, and reactive to light.  Cardiovascular:     Rate and Rhythm: Normal rate and regular rhythm.     Pulses:          Dorsalis pedis pulses are 1+ on the right side and 1+ on the left side.     Heart sounds: Normal heart sounds.  Pulmonary:     Effort: Pulmonary effort is normal.     Breath sounds: Normal breath sounds.  Chest:  Breasts:    Tanner Score is 5.     Right: Normal.     Left: Normal.  Abdominal:     General: Abdomen is flat. Bowel sounds are normal.     Palpations: Abdomen is soft.  Genitourinary:    Comments: deferred Musculoskeletal:        General: Normal range of motion.     Cervical back: Normal range of motion and neck supple.  Feet:     Right foot:     Protective Sensation: 5 sites tested.  4 sites sensed.     Skin integrity: Dry skin present.     Toenail Condition: Right toenails are abnormally thick.     Left foot:     Protective Sensation: 5 sites  tested.  4 sites sensed.     Skin integrity: Dry skin present.     Toenail Condition: Left toenails are abnormally thick.  Skin:    General: Skin is warm and dry.  Neurological:     General: No focal deficit present.     Mental Status: She is alert and oriented to person, place, and time.  Psychiatric:        Mood and Affect: Mood normal.        Behavior: Behavior normal.        Assessment And Plan:     1. Encounter for annual physical exam Comments: CPE performed. Importance of monthly self breast exams was discussed with the patient. PATIENT IS ADVISED TO GET 30-45 MINUTES REGULAR EXERCISE NO LESS THAN FOUR TO FIVE DAYS PER WEEK - BOTH WEIGHTBEARING EXERCISES AND AEROBIC ARE RECOMMENDED.  PATIENT IS ADVISED TO FOLLOW A HEALTHY DIET WITH AT LEAST SIX FRUITS/VEGGIES PER DAY, DECREASE INTAKE OF RED MEAT, AND TO INCREASE FISH INTAKE TO TWO DAYS PER WEEK.  MEATS/FISH SHOULD NOT BE FRIED, BAKED OR BROILED IS PREFERABLE.  IT IS ALSO IMPORTANT TO CUT BACK ON YOUR SUGAR INTAKE. PLEASE AVOID ANYTHING WITH ADDED SUGAR, CORN SYRUP OR OTHER SWEETENERS. IF YOU MUST USE A SWEETENER, YOU CAN TRY STEVIA. IT IS ALSO IMPORTANT TO AVOID ARTIFICIALLY SWEETENERS AND DIET BEVERAGES. LASTLY, I SUGGEST WEARING SPF 50 SUNSCREEN ON EXPOSED PARTS AND ESPECIALLY WHEN IN THE DIRECT SUNLIGHT FOR AN EXTENDED PERIOD OF TIME.  PLEASE AVOID FAST FOOD RESTAURANTS AND INCREASE YOUR WATER INTAKE.  2. Diabetic retinopathy associated with type 2 diabetes mellitus, macular edema presence unspecified, unspecified laterality, unspecified retinopathy severity ([place]) Comments: Uncontrolled. Previous labs reviewed, hba1c is 10.5%. She admits her sugars are above 200 in the mornings. I will increase Tresiba to 30 units nightly. She is encouraged to reschedule Endo appt. I DISCUSSED WITH THE PATIENT AT LENGTH REGARDING THE GOALS OF GLYCEMIC CONTROL AND POSSIBLE LONG-TERM COMPLICATIONS.  I  ALSO STRESSED THE IMPORTANCE OF COMPLIANCE WITH HOME  GLUCOSE MONITORING, DIETARY RESTRICTIONS INCLUDING AVOIDANCE OF SUGARY DRINKS/PROCESSED FOODS,  ALONG WITH REGULAR EXERCISE.  I  ALSO STRESSED THE IMPORTANCE OF ANNUAL EYE EXAMS, SELF FOOT CARE AND COMPLIANCE WITH OFFICE VISITS.   3. Hypertensive nephropathy Comments: Chronic, well controlled. No med changes. EKG not performed, reading from 11/10 reviewed. She will f/u in 4-6 months for re-evaluation.  She has upcoming appt with Cardiology for further eval of DOE.   4. Chronic renal disease, stage IV (HCC) Comments: Chronic, now followed by Renal. Encouraged to stay well hydrated, keep BP/Bs under optimal control.   5. Overweight (BMI 25.0-29.9) Comments: Her BMi is acceptable for her demographics. Encouraged to aim for 150 minutes of exercise per week.   Patient was given opportunity to ask questions. Patient verbalized understanding of the plan and was able to repeat key elements of the plan. All questions were answered to their satisfaction.   I, Maximino Greenland, MD, have reviewed all documentation for this visit. The documentation on 03/08/21 for the exam, diagnosis, procedures, and orders are all accurate and complete.   IF YOU HAVE BEEN REFERRED TO A SPECIALIST, IT MAY TAKE 1-2 WEEKS TO SCHEDULE/PROCESS THE REFERRAL. IF YOU HAVE NOT HEARD FROM US/SPECIALIST IN TWO WEEKS, PLEASE GIVE [place] A CALL AT [phone].   THE PATIENT IS ENCOURAGED TO PRACTICE SOCIAL DISTANCING DUE TO THE COVID-19 PANDEMIC.

## 2021-03-08 NOTE — Patient Instructions (Addendum)
Stop glipizide when AM sugars come down less than 150.  ALSO - change to MWF dosing for now  Health Maintenance, Female Adopting a healthy lifestyle and getting preventive care are important in promoting health and wellness. Ask your health care provider about: The right schedule for you to have regular tests and exams. Things you can do on your own to prevent diseases and keep yourself healthy. What should I know about diet, weight, and exercise? Eat a healthy diet  Eat a diet that includes plenty of vegetables, fruits, low-fat dairy products, and lean protein. Do not eat a lot of foods that are high in solid fats, added sugars, or sodium. Maintain a healthy weight Body mass index (BMI) is used to identify weight problems. It estimates body fat based on height and weight. Your health care provider can help determine your BMI and help you achieve or maintain a healthy weight. Get regular exercise Get regular exercise. This is one of the most important things you can do for your health. Most adults should: Exercise for at least 150 minutes each week. The exercise should increase your heart rate and make you sweat (moderate-intensity exercise). Do strengthening exercises at least twice a week. This is in addition to the moderate-intensity exercise. Spend less time sitting. Even light physical activity can be beneficial. Watch cholesterol and blood lipids Have your blood tested for lipids and cholesterol at 82 years of age, then have this test every 5 years. Have your cholesterol levels checked more often if: Your lipid or cholesterol levels are high. You are older than 82 years of age. You are at high risk for heart disease. What should I know about cancer screening? Depending on your health history and family history, you may need to have cancer screening at various ages. This may include screening for: Breast cancer. Cervical cancer. Colorectal cancer. Skin cancer. Lung cancer. What  should I know about heart disease, diabetes, and high blood pressure? Blood pressure and heart disease High blood pressure causes heart disease and increases the risk of stroke. This is more likely to develop in people who have high blood pressure readings or are overweight. Have your blood pressure checked: Every 3-5 years if you are 22-20 years of age. Every year if you are 30 years old or older. Diabetes Have regular diabetes screenings. This checks your fasting blood sugar level. Have the screening done: Once every three years after age 30 if you are at a normal weight and have a low risk for diabetes. More often and at a younger age if you are overweight or have a high risk for diabetes. What should I know about preventing infection? Hepatitis B If you have a higher risk for hepatitis B, you should be screened for this virus. Talk with your health care provider to find out if you are at risk for hepatitis B infection. Hepatitis C Testing is recommended for: Everyone born from [birth year] through 1967. Anyone with known risk factors for hepatitis C. Sexually transmitted infections (STIs) Get screened for STIs, including gonorrhea and chlamydia, if: You are sexually active and are younger than 82 years of age. You are older than 82 years of age and your health care provider tells you that you are at risk for this type of infection. Your sexual activity has changed since you were last screened, and you are at increased risk for chlamydia or gonorrhea. Ask your health care provider if you are at risk. Ask your health care provider about whether  you are at high risk for HIV. Your health care provider may recommend a prescription medicine to help prevent HIV infection. If you choose to take medicine to prevent HIV, you should first get tested for HIV. You should then be tested every 3 months for as long as you are taking the medicine. Pregnancy If you are about to stop having your period  (premenopausal) and you may become pregnant, seek counseling before you get pregnant. Take 400 to 800 micrograms (mcg) of folic acid every day if you become pregnant. Ask for birth control (contraception) if you want to prevent pregnancy. Osteoporosis and menopause Osteoporosis is a disease in which the bones lose minerals and strength with aging. This can result in bone fractures. If you are 32 years old or older, or if you are at risk for osteoporosis and fractures, ask your health care provider if you should: Be screened for bone loss. Take a calcium or vitamin D supplement to lower your risk of fractures. Be given hormone replacement therapy (HRT) to treat symptoms of menopause. Follow these instructions at home: Alcohol use Do not drink alcohol if: Your health care provider tells you not to drink. You are pregnant, may be pregnant, or are planning to become pregnant. If you drink alcohol: Limit how much you have to: 0-1 drink a day. Know how much alcohol is in your drink. In the [place] one drink equals one 12 oz bottle of beer (355 mL), one 5 oz glass of wine (148 mL), or one 1 oz glass of hard liquor (44 mL). Lifestyle Do not use any products that contain nicotine or tobacco. These products include cigarettes, chewing tobacco, and vaping devices, such as e-cigarettes. If you need help quitting, ask your health care provider. Do not use street drugs. Do not share needles. Ask your health care provider for help if you need support or information about quitting drugs. General instructions Schedule regular health, dental, and eye exams. Stay current with your vaccines. Tell your health care provider if: You often feel depressed. You have ever been abused or do not feel safe at home. Summary Adopting a healthy lifestyle and getting preventive care are important in promoting health and wellness. Follow your health care provider's instructions about healthy diet, exercising, and getting  tested or screened for diseases. Follow your health care provider's instructions on monitoring your cholesterol and blood pressure. This information is not intended to replace advice given to you by your health care provider. Make sure you discuss any questions you have with your health care provider. Document Revised: 08/16/2020 Document Reviewed: 08/16/2020 Elsevier Patient Education  [employer].

## 2021-03-09 ENCOUNTER — Other Ambulatory Visit: Payer: Self-pay | Admitting: Nurse Practitioner

## 2021-03-13 ENCOUNTER — Other Ambulatory Visit: Payer: Self-pay | Admitting: Internal Medicine

## 2021-03-14 ENCOUNTER — Telehealth: Payer: Self-pay

## 2021-03-14 DIAGNOSIS — Z794 Long term (current) use of insulin: Secondary | ICD-10-CM | POA: Diagnosis not present

## 2021-03-14 DIAGNOSIS — E119 Type 2 diabetes mellitus without complications: Secondary | ICD-10-CM | POA: Diagnosis not present

## 2021-03-14 NOTE — Telephone Encounter (Signed)
Mrs. Oswaldo Milian left a message that the pt's sugars is 375.  The pt's daughter was notified that dr sanders said increase the night time insulin by 3 units, Mrs. Watlington confirmed that the pt will now be at 33 units of tresiba

## 2021-03-24 DIAGNOSIS — Z794 Long term (current) use of insulin: Secondary | ICD-10-CM | POA: Diagnosis not present

## 2021-03-24 DIAGNOSIS — H4052X3 Glaucoma secondary to other eye disorders, left eye, severe stage: Secondary | ICD-10-CM | POA: Diagnosis not present

## 2021-03-24 DIAGNOSIS — E113311 Type 2 diabetes mellitus with moderate nonproliferative diabetic retinopathy with macular edema, right eye: Secondary | ICD-10-CM | POA: Diagnosis not present

## 2021-03-24 DIAGNOSIS — Z7984 Long term (current) use of oral hypoglycemic drugs: Secondary | ICD-10-CM | POA: Diagnosis not present

## 2021-03-24 DIAGNOSIS — H401133 Primary open-angle glaucoma, bilateral, severe stage: Secondary | ICD-10-CM | POA: Diagnosis not present

## 2021-03-24 DIAGNOSIS — H348122 Central retinal vein occlusion, left eye, stable: Secondary | ICD-10-CM | POA: Diagnosis not present

## 2021-03-24 DIAGNOSIS — H2102 Hyphema, left eye: Secondary | ICD-10-CM | POA: Diagnosis not present

## 2021-03-30 ENCOUNTER — Telehealth: Payer: Self-pay

## 2021-03-30 NOTE — Progress Notes (Signed)
° ° °  Chronic Care Management Pharmacy Assistant   Name: [name] [name]  MRN: [id number] DOB: 03-Jun-1940  Reason for Encounter:  Called patient left message regarding appointment 03/31/21.   Medications: Outpatient Encounter Medications as of 03/30/2021  Medication Sig   Blood Glucose Monitoring Suppl (TRUE METRIX METER) DEVI Use to check blood sugars 3 times a day.   glucose blood (TRUE METRIX BLOOD GLUCOSE TEST) test strip Use as instructed to check blood sugar 3 times a day. Dx code e11.65   ACCU-CHEK AVIVA PLUS test strip TEST THREE TIMES DAILY AS DIRECTED   amLODipine (NORVASC) 10 MG tablet TAKE 1 TABLET BY MOUTH EVERY DAY   aspirin 325 MG EC tablet Take 325 mg by mouth daily.   azithromycin (ZITHROMAX) 250 MG tablet Take 250 mg by mouth as directed.   B-D UF III MINI PEN NEEDLES 31G X 5 MM MISC Use as directed 4x a day   benzonatate (TESSALON) 100 MG capsule Take by mouth 3 (three) times daily as needed for cough.   blood glucose meter kit and supplies Dispense based on patient and insurance preference. Use up to four times daily as directed.dx code e11.65   cetirizine (ZYRTEC ALLERGY) 10 MG tablet Take 1 tablet (10 mg total) by mouth daily.   fluticasone (FLONASE) 50 MCG/ACT nasal spray Place 2 sprays into both nostrils daily.   glipiZIDE (GLUCOTROL XL) 2.5 MG 24 hr tablet Take 5 mg by mouth every morning.   hydrALAZINE (APRESOLINE) 25 MG tablet TAKE 1 TABLET(25 MG) BY MOUTH TWICE DAILY   hydrOXYzine (ATARAX/VISTARIL) 25 MG tablet Take 1 tablet by mouth at bedtime as needed. 1-2 tablets at bedtime prn   insulin lispro (HUMALOG KWIKPEN) 200 UNIT/ML KwikPen Inject under skin 8-10 units 15 min before the 3 main meals   Lancets (ACCU-CHEK SOFT TOUCH) lancets Check blood sugars 3 times daily E11.65   latanoprost (XALATAN) 0.005 % ophthalmic solution Place 1 drop into both eyes at bedtime.    meclizine (ANTIVERT) 25 MG tablet TAKE 1 TABLET BY MOUTH THREE TIMES DAILY AS NEEDED FOR  DIZZINESS (Patient not taking: Reported on 02/17/2021)   metoprolol succinate (TOPROL-XL) 25 MG 24 hr tablet TAKE 1 TABLET BY MOUTH EVERY DAY   Multiple Vitamin (MULTIVITAMIN) tablet Take 1 tablet by mouth daily.   rosuvastatin (CRESTOR) 10 MG tablet TAKE 1 TABLET BY MOUTH EVERY DAY   Semaglutide, 1 MG/DOSE, (OZEMPIC, 1 MG/DOSE,) 2 MG/1.5ML SOPN Inject 1 mg into the skin once a week.   telmisartan-hydrochlorothiazide (MICARDIS HCT) 80-25 MG tablet Take 1 tablet by mouth daily.   TRESIBA FLEXTOUCH 200 UNIT/ML FlexTouch Pen INJECT 28 UNITS INTO THE SKIN AT BEDTIME (Patient taking differently: Inject 30 Units into the skin at bedtime.)   No facility-administered encounter medications on file as of 03/30/2021.   [place] Pharmacist Assistant [phone]

## 2021-03-31 ENCOUNTER — Telehealth: Payer: Self-pay

## 2021-04-01 ENCOUNTER — Ambulatory Visit: Payer: Medicare Other | Admitting: Cardiovascular Disease

## 2021-04-01 ENCOUNTER — Encounter: Payer: Self-pay | Admitting: Cardiovascular Disease

## 2021-04-01 ENCOUNTER — Other Ambulatory Visit: Payer: Self-pay

## 2021-04-01 DIAGNOSIS — E118 Type 2 diabetes mellitus with unspecified complications: Secondary | ICD-10-CM

## 2021-04-01 DIAGNOSIS — Z8673 Personal history of transient ischemic attack (TIA), and cerebral infarction without residual deficits: Secondary | ICD-10-CM | POA: Diagnosis not present

## 2021-04-01 DIAGNOSIS — N184 Chronic kidney disease, stage 4 (severe): Secondary | ICD-10-CM

## 2021-04-01 DIAGNOSIS — I5189 Other ill-defined heart diseases: Secondary | ICD-10-CM | POA: Diagnosis not present

## 2021-04-01 DIAGNOSIS — Z794 Long term (current) use of insulin: Secondary | ICD-10-CM | POA: Diagnosis not present

## 2021-04-01 DIAGNOSIS — R0609 Other forms of dyspnea: Secondary | ICD-10-CM | POA: Diagnosis not present

## 2021-04-01 DIAGNOSIS — I10 Essential (primary) hypertension: Secondary | ICD-10-CM | POA: Diagnosis not present

## 2021-04-01 DIAGNOSIS — R609 Edema, unspecified: Secondary | ICD-10-CM

## 2021-04-01 MED ORDER — FUROSEMIDE 20 MG PO TABS: 20.0000 mg | ORAL_TABLET | ORAL | 3 refills | Status: DC | PRN

## 2021-04-01 NOTE — Progress Notes (Incomplete)
Cardiology Office Note    Date:  04/01/2021   ID:  [name], [name] March 01, 1941, MRN [id number]  PCP:  [name] Chard, MD  Cardiologist:  Shelva Majestic, MD   No chief complaint on file.   History of Present Illness:  [name] [name] is a 80 y.o. female ***    Past Medical History:  Diagnosis Date   Diabetes mellitus ([place])    H/O: CVA (cerebrovascular accident) 2002   HTN (hypertension)    Hyperlipidemia    Pulmonary HTN ([place])    mild by echo    Past Surgical History:  Procedure Laterality Date   ABDOMINAL HYSTERECTOMY     CARDIAC CATHETERIZATION  03/17/2003   Normal, patent coronary arteries. Normal LV systolic function   CARDIOVASCULAR STRESS TEST  08/28/2006   Small sized, moderate ischemiain the Basal Inferoseptal, Basal Inferior, Mid Inferior and Apical Inferior regions   TRANSTHORACIC ECHOCARDIOGRAM  02/17/2010   EF >55%, moderate-severely thickened septum, mild-moderate mitral annular calcification    Current Medications: Outpatient Medications Prior to Visit  Medication Sig Dispense Refill   ACCU-CHEK AVIVA PLUS test strip TEST THREE TIMES DAILY AS DIRECTED 100 strip 11   amLODipine (NORVASC) 10 MG tablet TAKE 1 TABLET BY MOUTH EVERY DAY 90 tablet 1   aspirin 325 MG EC tablet Take 325 mg by mouth daily.     azithromycin (ZITHROMAX) 250 MG tablet Take 250 mg by mouth as directed.     B-D UF III MINI PEN NEEDLES 31G X 5 MM MISC Use as directed 4x a day 300 each 3   benzonatate (TESSALON) 100 MG capsule Take by mouth 3 (three) times daily as needed for cough.     blood glucose meter kit and supplies Dispense based on patient and insurance preference. Use up to four times daily as directed.dx code e11.65 1 each 0   Blood Glucose Monitoring Suppl (TRUE METRIX METER) DEVI Use to check blood sugars 3 times a day. 1 each 3   cetirizine (ZYRTEC ALLERGY) 10 MG tablet Take 1 tablet (10 mg total) by mouth daily. 90 tablet 1   fluticasone (FLONASE) 50 MCG/ACT nasal  spray Place 2 sprays into both nostrils daily. 18.2 mL 2   glucose blood (TRUE METRIX BLOOD GLUCOSE TEST) test strip Use as instructed to check blood sugar 3 times a day. Dx code e11.65 100 each 12   hydrALAZINE (APRESOLINE) 25 MG tablet TAKE 1 TABLET(25 MG) BY MOUTH TWICE DAILY 180 tablet 1   hydrOXYzine (ATARAX/VISTARIL) 25 MG tablet Take 1 tablet by mouth at bedtime as needed. 1-2 tablets at bedtime prn     insulin lispro (HUMALOG KWIKPEN) 200 UNIT/ML KwikPen Inject under skin 8-10 units 15 min before the 3 main meals 9 mL 3   Lancets (ACCU-CHEK SOFT TOUCH) lancets Check blood sugars 3 times daily E11.65 100 each 12   latanoprost (XALATAN) 0.005 % ophthalmic solution Place 1 drop into both eyes at bedtime.      meclizine (ANTIVERT) 25 MG tablet TAKE 1 TABLET BY MOUTH THREE TIMES DAILY AS NEEDED FOR DIZZINESS 30 tablet 2   metoprolol succinate (TOPROL-XL) 25 MG 24 hr tablet TAKE 1 TABLET BY MOUTH EVERY DAY 90 tablet 1   Multiple Vitamin (MULTIVITAMIN) tablet Take 1 tablet by mouth daily.     rosuvastatin (CRESTOR) 10 MG tablet TAKE 1 TABLET BY MOUTH EVERY DAY 90 tablet 1   Semaglutide, 1 MG/DOSE, (OZEMPIC, 1 MG/DOSE,) 2 MG/1.5ML SOPN Inject 1 mg into the skin  once a week. 6 pen 3   telmisartan-hydrochlorothiazide (MICARDIS HCT) 80-25 MG tablet Take 1 tablet by mouth daily. 90 tablet 1   TRESIBA FLEXTOUCH 200 UNIT/ML FlexTouch Pen INJECT 28 UNITS INTO THE SKIN AT BEDTIME (Patient taking differently: Inject 30 Units into the skin at bedtime.) 15 mL 1   glipiZIDE (GLUCOTROL XL) 2.5 MG 24 hr tablet Take 5 mg by mouth every morning. (Patient not taking: Reported on 04/01/2021)     No facility-administered medications prior to visit.     Allergies:   Brimonidine, Dorzolamide, and Timolol   Social History   Socioeconomic History   Marital status: Married    Spouse name: Not on file   Number of children: Not on file   Years of education: Not on file   Highest education level: Not on file   Occupational History   Occupation: retired  Tobacco Use   Smoking status: Never   Smokeless tobacco: Never  Vaping Use   Vaping Use: Never used  Substance and Sexual Activity   Alcohol use: No   Drug use: Never   Sexual activity: Not Currently  Other Topics Concern   Not on file  Social History Narrative   Not on file   Social Determinants of Health   Financial Resource Strain: Low Risk    Difficulty of Paying Living Expenses: Not hard at all  Food Insecurity: No Food Insecurity   Worried About Charity fundraiser in the Last Year: Never true   [place] in the Last Year: Never true  Transportation Needs: No Transportation Needs   Lack of Transportation (Medical): No   Lack of Transportation (Non-Medical): No  Physical Activity: Inactive   Days of Exercise per Week: 0 days   Minutes of Exercise per Session: 0 min  Stress: No Stress Concern Present   Feeling of Stress : Not at all  Social Connections: Not on file     Family History:  The patient's family history includes Diabetes in her brother and sister; Heart Problems in her father; Hypertension in her brother, father, mother, and sister.   ROS General: Negative; No fevers, chills, or night sweats;  HEENT: Negative; No changes in vision or hearing, sinus congestion, difficulty swallowing Pulmonary: Negative; No cough, wheezing, shortness of breath, hemoptysis Cardiovascular: Negative; No chest pain, presyncope, syncope, palpitations GI: Negative; No nausea, vomiting, diarrhea, or abdominal pain GU: Negative; No dysuria, hematuria, or difficulty voiding Musculoskeletal: Negative; no myalgias, joint pain, or weakness Hematologic/Oncology: Negative; no easy bruising, bleeding Endocrine: Negative; no heat/cold intolerance; no diabetes Neuro: Negative; no changes in balance, headaches Skin: Negative; No rashes or skin lesions Psychiatric: Negative; No behavioral problems, depression Sleep: Negative; No snoring,  daytime sleepiness, hypersomnolence, bruxism, restless legs, hypnogognic hallucinations, no cataplexy Other comprehensive 14 point system review is negative.   PHYSICAL EXAM:   VS:  BP (!) 108/58    Pulse 62    Ht 5' 2"  (1.575 m)    Wt 158 lb 3.2 oz (71.8 kg)    SpO2 98%    BMI 28.94 kg/m     Wt Readings from Last 3 Encounters:  04/01/21 158 lb 3.2 oz (71.8 kg)  03/08/21 152 lb 9.6 oz (69.2 kg)  02/17/21 168 lb 3.2 oz (76.3 kg)    General: Alert, oriented, no distress.  Skin: normal turgor, no rashes, warm and dry HEENT: Normocephalic, atraumatic. Pupils equal round and reactive to light; sclera anicteric; extraocular muscles intact; Fundi ** Nose without nasal  septal hypertrophy Mouth/Parynx benign; Mallinpatti scale Neck: No JVD, no carotid bruits; normal carotid upstroke Lungs: clear to ausculatation and percussion; no wheezing or rales Chest wall: without tenderness to palpitation Heart: PMI not displaced, RRR, s1 s2 normal, 1/6 systolic murmur, no diastolic murmur, no rubs, gallops, thrills, or heaves Abdomen: soft, nontender; no hepatosplenomehaly, BS+; abdominal aorta nontender and not dilated by palpation. Back: no CVA tenderness Pulses 2+ Musculoskeletal: full range of motion, normal strength, no joint deformities Extremities: no clubbing cyanosis or edema, Homan's sign negative  Neurologic: grossly nonfocal; Cranial nerves grossly wnl Psychologic: Normal mood and affect   Studies/Labs Reviewed:   EKG:  EKG is*** ordered today.  The ekg ordered today demonstrates ***  Recent Labs: BMP Latest Ref Rng & Units 02/17/2021 09/09/2020 03/15/2020  Glucose 70 - 99 mg/dL 252(H) 96 262(H)  BUN 8 - 27 mg/dL 61(H) 46(H) 47(H)  Creatinine 0.57 - 1.00 mg/dL 2.77(H) 2.37(H) 2.55(H)  BUN/Creat Ratio 12 - 28 22 19 18   Sodium 134 - 144 mmol/L 137 147(H) 140  Potassium 3.5 - 5.2 mmol/L 4.1 4.8 3.7  Chloride 96 - 106 mmol/L 101 107(H) 99  CO2 20 - 29 mmol/L 22 24 24   Calcium 8.7 - 10.3  mg/dL 9.1 10.4(H) 9.5     Hepatic Function Latest Ref Rng & Units 02/17/2021 09/09/2020 03/15/2020  Total Protein 6.0 - 8.5 g/dL 7.0 7.3 7.5  Albumin 3.6 - 4.6 g/dL 4.1 4.5 4.3  AST 0 - 40 IU/L 21 19 27   ALT 0 - 32 IU/L 17 15 27   Alk Phosphatase 44 - 121 IU/L 108 118 120  Total Bilirubin 0.0 - 1.2 mg/dL <0.2 <0.2 <0.2    CBC Latest Ref Rng & Units 02/17/2021 09/09/2020 09/11/2019  WBC 3.4 - 10.8 x10E3/uL 10.6 10.2 11.9(H)  Hemoglobin 11.1 - 15.9 g/dL 10.0(L) 10.7(L) 10.0(L)  Hematocrit 34.0 - 46.6 % 31.1(L) 32.9(L) 31.5(L)  Platelets 150 - 450 x10E3/uL 351 388 448   Lab Results  Component Value Date   MCV 87 02/17/2021   MCV 86 09/09/2020   MCV 87 09/11/2019   Lab Results  Component Value Date   TSH 1.520 02/17/2021   Lab Results  Component Value Date   HGBA1C 10.5 (H) 02/17/2021     BNP    Component Value Date/Time   BNP 125.6 (H) 02/17/2021 1726    ProBNP No results found for: PROBNP   Lipid Panel     Component Value Date/Time   CHOL 169 02/17/2021 1713   TRIG 190 (H) 02/17/2021 1713   HDL 39 (L) 02/17/2021 1713   CHOLHDL 4.3 02/17/2021 1713   CHOLHDL 4 10/09/2018 0905   VLDL 37.8 10/09/2018 0905   LDLCALC 97 02/17/2021 1713   LABVLDL 33 02/17/2021 1713     RADIOLOGY: No results found.   Additional studies/ records that were reviewed today include:  ***    ASSESSMENT:    No diagnosis found.   PLAN:  ***   Medication Adjustments/Labs and Tests Ordered: Current medicines are reviewed at length with the patient today.  Concerns regarding medicines are outlined above.  Medication changes, Labs and Tests ordered today are listed in the Patient Instructions below. There are no Patient Instructions on file for this visit.   Signed, Shelva Majestic, MD  04/01/2021 3:05 PM    [facility] [address] [place], [place], [place]  [address] Phone: [phone]

## 2021-04-01 NOTE — Patient Instructions (Addendum)
Medication Instructions:  STOP Telmisartan-HCTZ START Furosemide 20 mg as needed for swelling   *If you need a refill on your cardiac medications before your next appointment, please call your pharmacy*   Lab Work: CMET, BNP (2-3 weeks, no lab appointment needed)  If you have labs (blood work) drawn today and your tests are completely normal, you will receive your results only by: [place] (if you have MyChart) OR A paper copy in the mail If you have any lab test that is abnormal or we need to change your treatment, we will call you to review the results.   Testing/Procedures: Keep scheduled appointment for upcoming ECHO.    Follow-Up: At [facility], you and your health needs are our priority.  As part of our continuing mission to provide you with exceptional heart care, we have created designated Provider Care Teams.  These Care Teams include your primary Cardiologist (physician) and Advanced Practice Providers (APPs -  Physician Assistants and Nurse Practitioners) who all work together to provide you with the care you need, when you need it.  We recommend signing up for the patient portal called "MyChart".  Sign up information is provided on this After Visit Summary.  MyChart is used to connect with patients for Virtual Visits (Telemedicine).  Patients are able to view lab/test results, encounter notes, upcoming appointments, etc.  Non-urgent messages can be sent to your provider as well.   To learn more about what you can do with MyChart, go to [email or web address].    Your next appointment:   2-3 month(s)  The format for your next appointment:   In Person  Provider:   Shelva Majestic, MD      How to Use Compression Stockings Compression stockings are elastic socks that help increase blood flow (circulation) to the legs, decrease swelling in the legs, and reduce the chance of developing blood clots in the lower legs. Compression stockings squeeze or apply pressure  to the legs. The stockings are graduated, meaning the highest amount of pressure occurs at the toes and it decreases going toward the upper part of the leg. This helps ensure proper circulation through the veins. Compression stockings are often used by people who: Are recovering from surgery. The stockings help prevent blood clots after surgery. Have poor circulation or swelling in their legs because of a medical condition, such as chronic venous insufficiency, venous stasis, or lymphedema. Have a history of getting blood clots in their legs. Have bulging (varicose) veins. Sit or stay in bed for long periods of time (immobilization). Stand for long periods of time and experience leg pain or fatigue. Follow instructions from your health care provider about how and when to wear your compression stockings. What are the risks? Generally, compression stockings are safe to wear. However, problems may occur for some people, such as: The stockings being ineffective at increasing the circulation to the legs, decreasing swelling in the legs, or reducing the chance of developing blood clots in the lower legs. Skin complications, including breaks in the skin, open wounds, blisters, or dermatitis. How to wear compression stockings Before you put on your compression stockings: Make sure that they are the correct size and degree of compression. If you do not know your size or required grade of compression, ask your health care provider and follow the manufacturer's instructions that come with the stockings. Be sure they are the appropriate length for your medical needs. Compression stockings come in different lengths, including knee high, thigh high, and even  up to the waist. Make sure that the stockings are clean, dry, and in good condition. Check the stockings for rips and tears. Do not put them on if they are ripped or torn. Put your stockings on first thing in the morning, before you get out of bed. Keep  them on for as long as your health care provider advises. Most people are told to remove their compression stockings at the end of the day before bed. When you are wearing your stockings: Keep them as smooth as possible. Do not allow them to bunch up. It is especially important to prevent the stockings from bunching up around your toes or behind your knees. Make sure that the toe holes are underneath the toes and the heel patches are positioned at the heels. Do not roll the stockings downward and leave them rolled down. This can decrease blood flow to your legs. Change the stockings right away if they become wet or dirty or if they have a bad smell. If you have chronic leg wounds, make sure the wounds are properly covered or dressed before putting on your compression stockings. When you take off your stockings, check your legs and feet for: Open sores. Red spots or other areas of discoloration. Swelling. General tips Do not stop wearing compression stockings. Talk to your health care provider if your stockings feel too tight. Wash your stockings often with mild detergent in cold or warm water. Also wash them whenever they get dirty or have a bad smell. Do not use bleach. Air-dry your stockings or dry them in a clothes dryer on low heat. It may be helpful to have two pairs so that you have a pair to wear while the other is being washed. Replace your stockings every 3-6 months. If skin moisturizing is part of your treatment plan, apply lotion or cream at night so that your skin will be dry when you put on the stockings in the morning. It is harder to put the stockings on when you have lotion on your legs or feet. Wear nonskid shoes or slip-resistant socks when walking while wearing compression stockings. If you have difficulty putting on or taking off the compression stockings, ask your health care provider about devices that may help make this easier. Contact a health care provider and remove your  stockings if: You have a prickling or tingling feeling in your feet or legs. You have new open sores, red spots, or other skin changes on your feet or legs. You have swelling or pain that gets worse. Get help right away if: You have shortness of breath or chest pain. Your heartbeat is fast or irregular. You have new swelling, pain, or warmth in your leg. You have numbness or tingling in your lower legs that does not get better after you take the stockings off. Your toes or feet are unusually cold or turn a bluish color. You feel light-headed or dizzy. These symptoms may represent a serious problem that is an emergency. Do not wait to see if the symptoms will go away. Get medical help right away. Call your local emergency services (911 in the [place] Do not drive yourself to the hospital. Summary Compression stockings are elastic socks that are worn to treat a variety of symptoms and medical conditions such as venous insufficiency, venous stasis, or lymphedema. Compression stockings help increase blood flow (circulation) to the legs, decrease swelling in the legs, and reduce the chance of developing blood clots in the lower legs. Follow instructions  from your health care provider about how and when to wear your compression stockings. Do not stop wearing your compression stockings without talking to your health care provider first. This information is not intended to replace advice given to you by your health care provider. Make sure you discuss any questions you have with your health care provider. Document Revised: 09/16/2020 Document Reviewed: 09/16/2020 Elsevier Patient Education  [place].

## 2021-04-01 NOTE — Progress Notes (Signed)
Cardiology Office Note    Date:  04/04/2021   ID:  [name], [name] [birth year], MRN [id number]  PCP:  [name] Chard, MD  Cardiologist:  [name] Majestic, MD   Reestablishment of cardiology care.  I had seen the patient remotely in 2014.  She is now referred by [name] [name] for cardiology evaluation  History of Present Illness:  [name] [name] is a 80 y.o. female who has a remote history of CVA which she suffered in 2002. In 2004 Dr. Melvern [name] performed cardiac catheterization which revealed normal coronary arteries. In May 2008 a nuclear perfusion study showed normal perfusion. An echo Doppler study in November 2011 revealed asymmetric left ventricular hypertrophy with grade 1 diastolic dysfunction and increased LA pressure. She did have mild to moderate mitral annular calcification with trace MR and mild pulmonary hypertension.   She has had diabetes that have been out of control and last year her hemoglobin and see if there is a 12.3. She also has a history of hyperlipidemia.   I last saw her in 2014 at which time she denied any episodes of chest pain or shortness of breath but admitted to some blood pressure lability.    She is followed by Dr. Glendale [name] for diabetes mellitus, hypertension, and she now has CKD and is seeing a nephrologist in [place].  She has recently noticed some increasing shortness of breath as well as some ankle swelling.  Reportedly she had a "bad cold "after Thanksgiving.  She has noticed progressive dyspnea on exertion.  She denies any presyncope or syncope.  She is unaware of any arrhythmias.  She presents now for evaluation.  Past Medical History:  Diagnosis Date   Diabetes mellitus ([place])    H/O: CVA (cerebrovascular accident) 2002   HTN (hypertension)    Hyperlipidemia    Pulmonary HTN ([place])    mild by echo    Past Surgical History:  Procedure Laterality Date   ABDOMINAL HYSTERECTOMY     CARDIAC CATHETERIZATION  03/17/2003   Normal,  patent coronary arteries. Normal LV systolic function   CARDIOVASCULAR STRESS TEST  08/28/2006   Small sized, moderate ischemiain the Basal Inferoseptal, Basal Inferior, Mid Inferior and Apical Inferior regions   TRANSTHORACIC ECHOCARDIOGRAM  02/17/2010   EF >55%, moderate-severely thickened septum, mild-moderate mitral annular calcification    Current Medications: Outpatient Medications Prior to Visit  Medication Sig Dispense Refill   ACCU-CHEK AVIVA PLUS test strip TEST THREE TIMES DAILY AS DIRECTED 100 strip 11   amLODipine (NORVASC) 10 MG tablet TAKE 1 TABLET BY MOUTH EVERY DAY 90 tablet 1   aspirin 325 MG EC tablet Take 325 mg by mouth daily.     azithromycin (ZITHROMAX) 250 MG tablet Take 250 mg by mouth as directed.     B-D UF III MINI PEN NEEDLES 31G X 5 MM MISC Use as directed 4x a day 300 each 3   benzonatate (TESSALON) 100 MG capsule Take by mouth 3 (three) times daily as needed for cough.     blood glucose meter kit and supplies Dispense based on patient and insurance preference. Use up to four times daily as directed.dx code e11.65 1 each 0   Blood Glucose Monitoring Suppl (TRUE METRIX METER) DEVI Use to check blood sugars 3 times a day. 1 each 3   cetirizine (ZYRTEC ALLERGY) 10 MG tablet Take 1 tablet (10 mg total) by mouth daily. 90 tablet 1   fluticasone (FLONASE) 50 MCG/ACT nasal spray Place 2 sprays  into both nostrils daily. 18.2 mL 2   glucose blood (TRUE METRIX BLOOD GLUCOSE TEST) test strip Use as instructed to check blood sugar 3 times a day. Dx code e11.65 100 each 12   hydrALAZINE (APRESOLINE) 25 MG tablet TAKE 1 TABLET(25 MG) BY MOUTH TWICE DAILY 180 tablet 1   hydrOXYzine (ATARAX/VISTARIL) 25 MG tablet Take 1 tablet by mouth at bedtime as needed. 1-2 tablets at bedtime prn     insulin lispro (HUMALOG KWIKPEN) 200 UNIT/ML KwikPen Inject under skin 8-10 units 15 min before the 3 main meals 9 mL 3   Lancets (ACCU-CHEK SOFT TOUCH) lancets Check blood sugars 3 times daily  E11.65 100 each 12   latanoprost (XALATAN) 0.005 % ophthalmic solution Place 1 drop into both eyes at bedtime.      meclizine (ANTIVERT) 25 MG tablet TAKE 1 TABLET BY MOUTH THREE TIMES DAILY AS NEEDED FOR DIZZINESS 30 tablet 2   metoprolol succinate (TOPROL-XL) 25 MG 24 hr tablet TAKE 1 TABLET BY MOUTH EVERY DAY 90 tablet 1   Multiple Vitamin (MULTIVITAMIN) tablet Take 1 tablet by mouth daily.     rosuvastatin (CRESTOR) 10 MG tablet TAKE 1 TABLET BY MOUTH EVERY DAY 90 tablet 1   Semaglutide, 1 MG/DOSE, (OZEMPIC, 1 MG/DOSE,) 2 MG/1.5ML SOPN Inject 1 mg into the skin once a week. 6 pen 3   TRESIBA FLEXTOUCH 200 UNIT/ML FlexTouch Pen INJECT 28 UNITS INTO THE SKIN AT BEDTIME (Patient taking differently: Inject 30 Units into the skin at bedtime.) 15 mL 1   telmisartan-hydrochlorothiazide (MICARDIS HCT) 80-25 MG tablet Take 1 tablet by mouth daily. 90 tablet 1   glipiZIDE (GLUCOTROL XL) 2.5 MG 24 hr tablet Take 5 mg by mouth every morning. (Patient not taking: Reported on 04/01/2021)     No facility-administered medications prior to visit.     Allergies:   Brimonidine, Dorzolamide, and Timolol   Social History   Socioeconomic History   Marital status: Married    Spouse name: Not on file   Number of children: Not on file   Years of education: Not on file   Highest education level: Not on file  Occupational History   Occupation: retired  Tobacco Use   Smoking status: Never   Smokeless tobacco: Never  Vaping Use   Vaping Use: Never used  Substance and Sexual Activity   Alcohol use: No   Drug use: Never   Sexual activity: Not Currently  Other Topics Concern   Not on file  Social History Narrative   Not on file   Social Determinants of Health   Financial Resource Strain: Low Risk    Difficulty of Paying Living Expenses: Not hard at all  Food Insecurity: No Food Insecurity   Worried About Charity fundraiser in the Last Year: Never true   [place] in the Last Year: Never true   Transportation Needs: No Transportation Needs   Lack of Transportation (Medical): No   Lack of Transportation (Non-Medical): No  Physical Activity: Inactive   Days of Exercise per Week: 0 days   Minutes of Exercise per Session: 0 min  Stress: No Stress Concern Present   Feeling of Stress : Not at all  Social Connections: Not on file    Socially she is married for 65 years.  She has 8 children, 10 grandchildren and 10 great-grandchildren.  She is retired.  She completed 10th grade of education.  There is no tobacco history.  She has approximately 1  drink per month.  She tries to exercise at least 1 day/week.   Family History:  The patient's family history includes Diabetes in her brother and sister; Heart Problems in her father; Hypertension in her brother, father, mother, and sister.   ROS General: Negative; No fevers, chills, or night sweats;  HEENT: Negative; No changes in vision or hearing, sinus congestion, difficulty swallowing Pulmonary: Negative; No cough, wheezing, shortness of breath, hemoptysis Cardiovascular: Positive for hypertension, dyspnea on exertion.  No chest pain or palpitations GI: Negative; No nausea, vomiting, diarrhea, or abdominal pain GU: Negative; No dysuria, hematuria, or difficulty voiding Musculoskeletal: Negative; no myalgias, joint pain, or weakness Hematologic/Oncology: Negative; no easy bruising, bleeding Endocrine: Negative; no heat/cold intolerance; no diabetes Neuro: History of stroke Skin: Negative; No rashes or skin lesions Psychiatric: Negative; No behavioral problems, depression Sleep: Negative; No snoring, daytime sleepiness, hypersomnolence, bruxism, restless legs, hypnogognic hallucinations, no cataplexy Other comprehensive 14 point system review is negative.   PHYSICAL EXAM:   VS:  BP (!) 108/58    Pulse 62    Ht 5' 2"  (1.575 m)    Wt 158 lb 3.2 oz (71.8 kg)    SpO2 98%    BMI 28.94 kg/m     Repeat blood pressure by me was 154/70  supine initially with repeat 142/62.  Wt Readings from Last 3 Encounters:  04/01/21 158 lb 3.2 oz (71.8 kg)  03/08/21 152 lb 9.6 oz (69.2 kg)  02/17/21 168 lb 3.2 oz (76.3 kg)    General: Alert, oriented, no distress.  Skin: normal turgor, no rashes, warm and dry HEENT: Normocephalic, atraumatic. Pupils equal round and reactive to light; sclera anicteric; extraocular muscles intact;  Nose without nasal septal hypertrophy Mouth/Parynx benign; Mallinpatti scale 3 Neck: No JVD, no carotid bruits; normal carotid upstroke Lungs: clear to ausculatation and percussion; no wheezing or rales Chest wall: without tenderness to palpitation Heart: PMI not displaced, RRR, s1 s2 normal, 1/6 systolic murmur, no diastolic murmur, no rubs, gallops, thrills, or heaves Abdomen: soft, nontender; no hepatosplenomehaly, BS+; abdominal aorta nontender and not dilated by palpation. Back: no CVA tenderness Pulses 2+ Musculoskeletal: full range of motion, normal strength, no joint deformities Extremities: Trace ankle edema left greater than right; no clubbing cyanosis or  Homan's sign negative  Neurologic: grossly nonfocal; Cranial nerves grossly wnl Psychologic: Normal mood and affect   Studies/Labs Reviewed:   EKG:  EKG is ordered today.  ECG (independently read by me): NSR at 62; LBBB; PR 202  Recent Labs: BMP Latest Ref Rng & Units 02/17/2021 09/09/2020 03/15/2020  Glucose 70 - 99 mg/dL 252(H) 96 262(H)  BUN 8 - 27 mg/dL 61(H) 46(H) 47(H)  Creatinine 0.57 - 1.00 mg/dL 2.77(H) 2.37(H) 2.55(H)  BUN/Creat Ratio 12 - 28 22 19 18   Sodium 134 - 144 mmol/L 137 147(H) 140  Potassium 3.5 - 5.2 mmol/L 4.1 4.8 3.7  Chloride 96 - 106 mmol/L 101 107(H) 99  CO2 20 - 29 mmol/L 22 24 24   Calcium 8.7 - 10.3 mg/dL 9.1 10.4(H) 9.5     Hepatic Function Latest Ref Rng & Units 02/17/2021 09/09/2020 03/15/2020  Total Protein 6.0 - 8.5 g/dL 7.0 7.3 7.5  Albumin 3.6 - 4.6 g/dL 4.1 4.5 4.3  AST 0 - 40 IU/L 21 19 27   ALT 0 -  32 IU/L 17 15 27   Alk Phosphatase 44 - 121 IU/L 108 118 120  Total Bilirubin 0.0 - 1.2 mg/dL <0.2 <0.2 <0.2    CBC Latest Ref Rng &  Units 02/17/2021 09/09/2020 09/11/2019  WBC 3.4 - 10.8 x10E3/uL 10.6 10.2 11.9(H)  Hemoglobin 11.1 - 15.9 g/dL 10.0(L) 10.7(L) 10.0(L)  Hematocrit 34.0 - 46.6 % 31.1(L) 32.9(L) 31.5(L)  Platelets 150 - 450 x10E3/uL 351 388 448   Lab Results  Component Value Date   MCV 87 02/17/2021   MCV 86 09/09/2020   MCV 87 09/11/2019   Lab Results  Component Value Date   TSH 1.520 02/17/2021   Lab Results  Component Value Date   HGBA1C 10.5 (H) 02/17/2021     BNP    Component Value Date/Time   BNP 125.6 (H) 02/17/2021 1726    ProBNP No results found for: PROBNP   Lipid Panel     Component Value Date/Time   CHOL 169 02/17/2021 1713   TRIG 190 (H) 02/17/2021 1713   HDL 39 (L) 02/17/2021 1713   CHOLHDL 4.3 02/17/2021 1713   CHOLHDL 4 10/09/2018 0905   VLDL 37.8 10/09/2018 0905   LDLCALC 97 02/17/2021 1713   LABVLDL 33 02/17/2021 1713     RADIOLOGY: No results found.   Additional studies/ records that were reviewed today include:  I reviewed laboratory results since November 2020. Records of Dr. Baird Cancer were reviewed  ASSESSMENT:    1. Dyspnea on exertion   2. Swelling   3. Hypertension, unspecified type   4. Left ventricular diastolic dysfunction, NYHA class 1   5. CKD (chronic kidney disease) stage 4, GFR 15-29 ml/min (HCC)   6. Type 2 diabetes mellitus with complication, with long-term current use of insulin ([place])   7. Remote history of stroke      PLAN:  [name] [name] is an 80 year old female who has remote history of CVA in 2002.  Cardiac catheterization by Dr. Melvern [name] in 2004 showed normal coronary arteries.  An echo Doppler study in November 2011 showed asymmetric LVH with grade 1 diastolic dysfunction with increased LA pressure as well as mild to moderate mitral annular calcification with trace MR and mild pulmonary  hypertension.  In the past she had had poorly controlled diabetes mellitus.  She now has developed progressive CKD and has noticed increasing shortness of breath.  At present she is on a medical regimen consisting of amlodipine 10 mg, hydralazine 25 mg twice a day, metoprolol succinate 25 mg daily, telmisartan HCT 80/25 mg, and she is on Ozempic, Humalog insulin, for diabetes and rosuvastatin 10 mg for hyperlipidemia.  I reviewed her laboratory.  Of note, in November 2020 creatinine was 1.56 which increased to 1.73 on February 27, 2019, 1.80 on September 11, 2019, 2.55 on March 15, 2020, 2.37 on September 09, 2020 and most recently on February 17, 2021 her BUN had increased from 46-61 and creatinine was now 2.77.  Although there is data for CKD to delay progressive disease with ARB therapy, with her significant increase in BUN and creatinine of 2.77 I have temporarily recommended she hold her telmisartan HCT until she is reevaluated by her nephrologist hopefully in several weeks.  Repeat laboratory will need to be performed prior to that evaluation.  Because her blood pressure is elevated I am recommending increasing hydralazine from 25 mg twice a day to 50 mg twice a day.  I have given her a prescription for furosemide 20 mg to take on an as-needed basis and have recommended she wear support stockings.  She does not have any chest pain or anginal symptomatology.  I am scheduling her for an echo Doppler study to be done next week  for reassessment of her LV systolic and diastolic function and valvular architecture.  I will see her in 6 to 8 weeks for follow-up evaluation.    Medication Adjustments/Labs and Tests Ordered: Current medicines are reviewed at length with the patient today.  Concerns regarding medicines are outlined above.  Medication changes, Labs and Tests ordered today are listed in the Patient Instructions below. Patient Instructions  Medication Instructions:  STOP Telmisartan-HCTZ START Furosemide 20 mg  as needed for swelling   *If you need a refill on your cardiac medications before your next appointment, please call your pharmacy*   Lab Work: CMET, BNP (2-3 weeks, no lab appointment needed)  If you have labs (blood work) drawn today and your tests are completely normal, you will receive your results only by: [place] (if you have MyChart) OR A paper copy in the mail If you have any lab test that is abnormal or we need to change your treatment, we will call you to review the results.   Testing/Procedures: Keep scheduled appointment for upcoming ECHO.    Follow-Up: At [facility], you and your health needs are our priority.  As part of our continuing mission to provide you with exceptional heart care, we have created designated Provider Care Teams.  These Care Teams include your primary Cardiologist (physician) and Advanced Practice Providers (APPs -  Physician Assistants and Nurse Practitioners) who all work together to provide you with the care you need, when you need it.  We recommend signing up for the patient portal called "MyChart".  Sign up information is provided on this After Visit Summary.  MyChart is used to connect with patients for Virtual Visits (Telemedicine).  Patients are able to view lab/test results, encounter notes, upcoming appointments, etc.  Non-urgent messages can be sent to your provider as well.   To learn more about what you can do with MyChart, go to [email or web address].    Your next appointment:   2-3 month(s)  The format for your next appointment:   In Person  Provider:   Shelva Majestic, MD      How to Use Compression Stockings Compression stockings are elastic socks that help increase blood flow (circulation) to the legs, decrease swelling in the legs, and reduce the chance of developing blood clots in the lower legs. Compression stockings squeeze or apply pressure to the legs. The stockings are graduated, meaning the highest amount of  pressure occurs at the toes and it decreases going toward the upper part of the leg. This helps ensure proper circulation through the veins. Compression stockings are often used by people who: Are recovering from surgery. The stockings help prevent blood clots after surgery. Have poor circulation or swelling in their legs because of a medical condition, such as chronic venous insufficiency, venous stasis, or lymphedema. Have a history of getting blood clots in their legs. Have bulging (varicose) veins. Sit or stay in bed for long periods of time (immobilization). Stand for long periods of time and experience leg pain or fatigue. Follow instructions from your health care provider about how and when to wear your compression stockings. What are the risks? Generally, compression stockings are safe to wear. However, problems may occur for some people, such as: The stockings being ineffective at increasing the circulation to the legs, decreasing swelling in the legs, or reducing the chance of developing blood clots in the lower legs. Skin complications, including breaks in the skin, open wounds, blisters, or dermatitis. How to wear compression stockings Before  you put on your compression stockings: Make sure that they are the correct size and degree of compression. If you do not know your size or required grade of compression, ask your health care provider and follow the manufacturer's instructions that come with the stockings. Be sure they are the appropriate length for your medical needs. Compression stockings come in different lengths, including knee high, thigh high, and even up to the waist. Make sure that the stockings are clean, dry, and in good condition. Check the stockings for rips and tears. Do not put them on if they are ripped or torn. Put your stockings on first thing in the morning, before you get out of bed. Keep them on for as long as your health care provider advises. Most people are  told to remove their compression stockings at the end of the day before bed. When you are wearing your stockings: Keep them as smooth as possible. Do not allow them to bunch up. It is especially important to prevent the stockings from bunching up around your toes or behind your knees. Make sure that the toe holes are underneath the toes and the heel patches are positioned at the heels. Do not roll the stockings downward and leave them rolled down. This can decrease blood flow to your legs. Change the stockings right away if they become wet or dirty or if they have a bad smell. If you have chronic leg wounds, make sure the wounds are properly covered or dressed before putting on your compression stockings. When you take off your stockings, check your legs and feet for: Open sores. Red spots or other areas of discoloration. Swelling. General tips Do not stop wearing compression stockings. Talk to your health care provider if your stockings feel too tight. Wash your stockings often with mild detergent in cold or warm water. Also wash them whenever they get dirty or have a bad smell. Do not use bleach. Air-dry your stockings or dry them in a clothes dryer on low heat. It may be helpful to have two pairs so that you have a pair to wear while the other is being washed. Replace your stockings every 3-6 months. If skin moisturizing is part of your treatment plan, apply lotion or cream at night so that your skin will be dry when you put on the stockings in the morning. It is harder to put the stockings on when you have lotion on your legs or feet. Wear nonskid shoes or slip-resistant socks when walking while wearing compression stockings. If you have difficulty putting on or taking off the compression stockings, ask your health care provider about devices that may help make this easier. Contact a health care provider and remove your stockings if: You have a prickling or tingling feeling in your feet or  legs. You have new open sores, red spots, or other skin changes on your feet or legs. You have swelling or pain that gets worse. Get help right away if: You have shortness of breath or chest pain. Your heartbeat is fast or irregular. You have new swelling, pain, or warmth in your leg. You have numbness or tingling in your lower legs that does not get better after you take the stockings off. Your toes or feet are unusually cold or turn a bluish color. You feel light-headed or dizzy. These symptoms may represent a serious problem that is an emergency. Do not wait to see if the symptoms will go away. Get medical help right away. Call your  local emergency services (911 in the [place] Do not drive yourself to the hospital. Summary Compression stockings are elastic socks that are worn to treat a variety of symptoms and medical conditions such as venous insufficiency, venous stasis, or lymphedema. Compression stockings help increase blood flow (circulation) to the legs, decrease swelling in the legs, and reduce the chance of developing blood clots in the lower legs. Follow instructions from your health care provider about how and when to wear your compression stockings. Do not stop wearing your compression stockings without talking to your health care provider first. This information is not intended to replace advice given to you by your health care provider. Make sure you discuss any questions you have with your health care provider. Document Revised: 09/16/2020 Document Reviewed: 09/16/2020 Elsevier Patient Education  2022 [place], [name] Majestic, MD  04/04/2021 4:37 PM    [facility] [address] [place], [place], [place]  [address] Phone: [phone]

## 2021-04-04 ENCOUNTER — Encounter: Payer: Self-pay | Admitting: Cardiovascular Disease

## 2021-04-05 ENCOUNTER — Ambulatory Visit (HOSPITAL_COMMUNITY): Payer: Medicare Other | Attending: Cardiovascular Disease

## 2021-04-05 ENCOUNTER — Other Ambulatory Visit: Payer: Self-pay

## 2021-04-05 DIAGNOSIS — R0609 Other forms of dyspnea: Secondary | ICD-10-CM | POA: Insufficient documentation

## 2021-04-05 LAB — ECHOCARDIOGRAM COMPLETE
Area-P 1/2: 2.37 cm2
MV VTI: 1.24 cm2
S' Lateral: 2 cm

## 2021-04-25 ENCOUNTER — Telehealth: Payer: Self-pay

## 2021-04-25 DIAGNOSIS — N184 Chronic kidney disease, stage 4 (severe): Secondary | ICD-10-CM | POA: Diagnosis not present

## 2021-04-25 DIAGNOSIS — I5032 Chronic diastolic (congestive) heart failure: Secondary | ICD-10-CM | POA: Diagnosis not present

## 2021-04-25 DIAGNOSIS — R801 Persistent proteinuria, unspecified: Secondary | ICD-10-CM | POA: Diagnosis not present

## 2021-04-25 DIAGNOSIS — N189 Chronic kidney disease, unspecified: Secondary | ICD-10-CM | POA: Diagnosis not present

## 2021-04-25 DIAGNOSIS — E1122 Type 2 diabetes mellitus with diabetic chronic kidney disease: Secondary | ICD-10-CM | POA: Diagnosis not present

## 2021-04-25 DIAGNOSIS — N179 Acute kidney failure, unspecified: Secondary | ICD-10-CM | POA: Diagnosis not present

## 2021-04-25 DIAGNOSIS — D631 Anemia in chronic kidney disease: Secondary | ICD-10-CM | POA: Diagnosis not present

## 2021-04-25 DIAGNOSIS — I129 Hypertensive chronic kidney disease with stage 1 through stage 4 chronic kidney disease, or unspecified chronic kidney disease: Secondary | ICD-10-CM | POA: Diagnosis not present

## 2021-04-25 NOTE — Chronic Care Management (AMB) (Signed)
Chronic Care Management Pharmacy Assistant   Name: [name] [name]  MRN: [id number] DOB: 07-Apr-1941  Reason for Encounter: Disease State/ Diabetes   Recent office visits:  03-08-2021 Glendale Chard, MD. Tdap ordered. Stop glipizide when morning sugars are less than 150.  02-17-2021 Glendale Chard, MD. Referral placed to cardiology. BNP= 125.6. Hemo= 10.0, RBC= 3.59, Hema= 31.1. Glucose= 252, BUN= 61, Creatinine= 2.77, eGFR= 17. A1C= 10.5. TRIG= 190, HDL= 39. Echocardiogram and EKG ordered   Recent consult visits:  04-01-2021 Troy Sine, MD (Cardiology). START Lasix 20 mg as needed. STOP glipizide and micardis HCT.  03-24-2021 [name], MD (Ophthalmology). Intravitreal Injection, Bevacizumab right eye.  01-24-2021 [name] Ingegerd Charlotta, PA-C (ENT). Flexible Nasal Procedure completed.  Hospital visits:  None in previous 6 months  Medications: Outpatient Encounter Medications as of 04/25/2021  Medication Sig   ACCU-CHEK AVIVA PLUS test strip TEST THREE TIMES DAILY AS DIRECTED   amLODipine (NORVASC) 10 MG tablet TAKE 1 TABLET BY MOUTH EVERY DAY   aspirin 325 MG EC tablet Take 325 mg by mouth daily.   azithromycin (ZITHROMAX) 250 MG tablet Take 250 mg by mouth as directed.   B-D UF III MINI PEN NEEDLES 31G X 5 MM MISC Use as directed 4x a day   benzonatate (TESSALON) 100 MG capsule Take by mouth 3 (three) times daily as needed for cough.   blood glucose meter kit and supplies Dispense based on patient and insurance preference. Use up to four times daily as directed.dx code e11.65   Blood Glucose Monitoring Suppl (TRUE METRIX METER) DEVI Use to check blood sugars 3 times a day.   cetirizine (ZYRTEC ALLERGY) 10 MG tablet Take 1 tablet (10 mg total) by mouth daily.   fluticasone (FLONASE) 50 MCG/ACT nasal spray Place 2 sprays into both nostrils daily.   furosemide (LASIX) 20 MG tablet Take 1 tablet (20 mg total) by mouth as needed.   glucose blood  (TRUE METRIX BLOOD GLUCOSE TEST) test strip Use as instructed to check blood sugar 3 times a day. Dx code e11.65   hydrALAZINE (APRESOLINE) 25 MG tablet TAKE 1 TABLET(25 MG) BY MOUTH TWICE DAILY   hydrOXYzine (ATARAX/VISTARIL) 25 MG tablet Take 1 tablet by mouth at bedtime as needed. 1-2 tablets at bedtime prn   insulin lispro (HUMALOG KWIKPEN) 200 UNIT/ML KwikPen Inject under skin 8-10 units 15 min before the 3 main meals   Lancets (ACCU-CHEK SOFT TOUCH) lancets Check blood sugars 3 times daily E11.65   latanoprost (XALATAN) 0.005 % ophthalmic solution Place 1 drop into both eyes at bedtime.    meclizine (ANTIVERT) 25 MG tablet TAKE 1 TABLET BY MOUTH THREE TIMES DAILY AS NEEDED FOR DIZZINESS   metoprolol succinate (TOPROL-XL) 25 MG 24 hr tablet TAKE 1 TABLET BY MOUTH EVERY DAY   Multiple Vitamin (MULTIVITAMIN) tablet Take 1 tablet by mouth daily.   rosuvastatin (CRESTOR) 10 MG tablet TAKE 1 TABLET BY MOUTH EVERY DAY   Semaglutide, 1 MG/DOSE, (OZEMPIC, 1 MG/DOSE,) 2 MG/1.5ML SOPN Inject 1 mg into the skin once a week.   TRESIBA FLEXTOUCH 200 UNIT/ML FlexTouch Pen INJECT 28 UNITS INTO THE SKIN AT BEDTIME (Patient taking differently: Inject 30 Units into the skin at bedtime.)   No facility-administered encounter medications on file as of 04/25/2021.  Recent Relevant Labs: Lab Results  Component Value Date/Time   HGBA1C 10.5 (H) 02/17/2021 05:13 PM   HGBA1C 10.0 (H) 09/09/2020 12:15 PM   MICROALBUR 150 09/09/2020 12:43 PM  MICROALBUR 150 09/04/2019 02:19 PM    Kidney Function Lab Results  Component Value Date/Time   CREATININE 2.77 (H) 02/17/2021 05:13 PM   CREATININE 2.37 (H) 09/09/2020 12:15 PM   CREATININE 1.95 (H) 10/09/2018 09:05 AM   GFRNONAA 17 (L) 03/15/2020 11:49 AM   GFRNONAA 24 (L) 10/09/2018 09:05 AM   GFRAA 20 (L) 03/15/2020 11:49 AM   GFRAA 27 (L) 10/09/2018 09:05 AM    04-25-2021: 1st attempt left VM 04-26-2021: 2nd attempt left VM 04-28-2021: 3rd attempt left  VM  Care Gaps: Tdap overdue Yearly ophthalmology overdue AWV 10-06-2021  Star Rating Drugs: Ozempic 1 mg- Patient assistance Rosuvastatin 10 mg- Last filled 02-15-2021 90 DS [facility] Telmisartan-HCTZ 80-25 mg- Last filled 03-08-2021 90 DS [place] Clinical Pharmacist Assistant [phone]

## 2021-05-12 ENCOUNTER — Ambulatory Visit: Payer: Medicare Other | Admitting: Internal Medicine

## 2021-05-16 ENCOUNTER — Other Ambulatory Visit: Payer: Self-pay | Admitting: Internal Medicine

## 2021-05-20 ENCOUNTER — Ambulatory Visit: Payer: Medicare Other | Admitting: Internal Medicine

## 2021-05-20 ENCOUNTER — Other Ambulatory Visit: Payer: Self-pay

## 2021-05-20 ENCOUNTER — Encounter: Payer: Self-pay | Admitting: Internal Medicine

## 2021-05-20 VITALS — BP 132/72 | HR 70 | Ht 62.0 in | Wt 157.6 lb

## 2021-05-20 DIAGNOSIS — E663 Overweight: Secondary | ICD-10-CM | POA: Diagnosis not present

## 2021-05-20 DIAGNOSIS — E785 Hyperlipidemia, unspecified: Secondary | ICD-10-CM

## 2021-05-20 DIAGNOSIS — E1159 Type 2 diabetes mellitus with other circulatory complications: Secondary | ICD-10-CM | POA: Diagnosis not present

## 2021-05-20 DIAGNOSIS — E1165 Type 2 diabetes mellitus with hyperglycemia: Secondary | ICD-10-CM | POA: Diagnosis not present

## 2021-05-20 DIAGNOSIS — E1169 Type 2 diabetes mellitus with other specified complication: Secondary | ICD-10-CM | POA: Diagnosis not present

## 2021-05-20 LAB — POCT GLYCOSYLATED HEMOGLOBIN (HGB A1C): Hemoglobin A1C: 9.6 % — AB (ref 4.0–5.6)

## 2021-05-20 MED ORDER — OZEMPIC (1 MG/DOSE) 2 MG/1.5ML ~~LOC~~ SOPN: 1.0000 mg | PEN_INJECTOR | SUBCUTANEOUS | 1 refills | Status: DC

## 2021-05-20 MED ORDER — TRESIBA FLEXTOUCH 200 UNIT/ML ~~LOC~~ SOPN: 24.0000 [IU] | PEN_INJECTOR | Freq: Every day | SUBCUTANEOUS | 1 refills | Status: DC

## 2021-05-20 NOTE — Progress Notes (Signed)
Patient ID: [name] [name], female   DOB: 23-Aug-1940, 81 y.o.   MRN: [id number]   This visit occurred during the SARS-CoV-2 public health emergency.  Safety protocols were in place, including screening questions prior to the visit, additional usage of staff PPE, and extensive cleaning of exam room while observing appropriate contact time as indicated for disinfecting solutions.   HPI: [name] [name] is a 81 y.o.-year-old female, returning for follow-up for DM2, dx in the 1980s, insulin-dependent since ~2010, uncontrolled, with complications (dCHF, Cerebrovascular disease - history of CVA 2002, CKD, DR, PN).  Last visit 1 year and 3 months ago.  Interim history: No increased urination, blurry vision, nausea, chest pain. She is now on a strict diet placed on daughter.  Reviewed HbA1c levels: Lab Results  Component Value Date   HGBA1C 10.5 (H) 02/17/2021   HGBA1C 10.0 (H) 09/09/2020   HGBA1C 10.4 (H) 03/15/2020   HGBA1C 8.8 (A) 11/27/2019   HGBA1C 7.7 (H) 09/11/2019   HGBA1C 8.8 (A) 07/03/2019   HGBA1C 8.9 (H) 02/13/2019   HGBA1C 9.4 (A) 10/09/2018   HGBA1C 9.6 (A) 06/14/2018   HGBA1C 8.4 (A) 02/26/2018  09/10/2017: HbA1c 10.4%  Pt is on a regimen of: - >> Humalog 8-10 units 15 min before meals >> NOT TAKING - Tresiba 28 >> 26 >> 20 >> 28 units daily - Ozempic 0.5 mg weekly-added 06/2018 >> increased to 1 mg weekly 06/2019, but now taking only 0.5 mg weekly In 02/2018: We stopped Onglyza since this was expensive and started glipizide ER low-dose. She was previously also on Levemir 35 units daily. She was previously on Metformin but stopped 10/2019 due to worsening kidney function.  Pt checks her sugars >4x a day with the Freestyle Libre CGM (no receiver with her): - am: 59-141, 197-224  >> 80-125 >> 64-190, 216, 330 >> 57-120 - 2h after b'fast: n/c >> 269 >> n/c - before lunch: n/c >> ?, 190 >> n/c >> 246 >> 120-130 - 2h after lunch: n/c >> 199 >> n/c - before dinner: n/c >>  120-? - 2h after dinner: n/c - bedtime: n/c >> 98, 151-352 >> ? - nighttime: n/c Lowest sugar was 59 >> 80 >> 64 >> 57; she has hypoglycemia awareness in the 70s. Highest sugar was  441 (missed insulin) ... >> 200 >> 352 >> 240s.  Glucometer: Accu-Chek [place]iva  Pt's meals are - eats 2x a day: - Breakfast: oatmeal + bacon - Lunch: veggies and a piece of chicken - small meal - Dinner: chicken, veggies - Snacks: apples, oranges  -+ CKD, last BUN/creatinine:  Lab Results  Component Value Date   BUN 61 (H) 02/17/2021   BUN 46 (H) 09/09/2020   CREATININE 2.77 (H) 02/17/2021   CREATININE 2.37 (H) 09/09/2020  09/10/2017: 27/1.52, GFR 37 On telmisartan 80.  -+ HL; lipids: Lab Results  Component Value Date   CHOL 169 02/17/2021   HDL 39 (L) 02/17/2021   LDLCALC 97 02/17/2021   TRIG 190 (H) 02/17/2021   CHOLHDL 4.3 02/17/2021  08/14/2017: 151/187/44/70 On Crestor 10.  She is off Vascepa.  - last eye exam was in 04/2021: reportedly + Moderate NPDR OD, + macular edema.  She was taking IO injections.  Her stroke affected her left eye vision.  -She has numbness and tingling in her feet  On ASA 325.  Pt has FH of DM in father.  TFTs reviewed: Lab Results  Component Value Date   TSH 1.520 02/17/2021  06/07/2016: 2.110.  ROS: + see hpi Neurological: no tremors/+ numbness/+ tingling/no dizziness  I reviewed pt's medications, allergies, PMH, social hx, family hx, and changes were documented in the history of present illness. Otherwise, unchanged from my initial visit note.  Past Medical History:  Diagnosis Date   Diabetes mellitus ([place])    H/O: CVA (cerebrovascular accident) 2002   HTN (hypertension)    Hyperlipidemia    Pulmonary HTN ([place])    mild by echo   Past Surgical History:  Procedure Laterality Date   ABDOMINAL HYSTERECTOMY     CARDIAC CATHETERIZATION  03/17/2003   Normal, patent coronary arteries. Normal LV systolic function   CARDIOVASCULAR STRESS TEST  08/28/2006    Small sized, moderate ischemiain the Basal Inferoseptal, Basal Inferior, Mid Inferior and Apical Inferior regions   TRANSTHORACIC ECHOCARDIOGRAM  02/17/2010   EF >55%, moderate-severely thickened septum, mild-moderate mitral annular calcification   Social History   Socioeconomic History   Marital status: Married    Spouse name: Not on file   Number of children: Not on file   Years of education: Not on file   Highest education level: Not on file  Occupational History   Occupation: retired  Tobacco Use   Smoking status: Never   Smokeless tobacco: Never  Vaping Use   Vaping Use: Never used  Substance and Sexual Activity   Alcohol use: No   Drug use: Never   Sexual activity: Not Currently  Other Topics Concern   Not on file  Social History Narrative   Not on file   Social Determinants of Health   Financial Resource Strain: Low Risk    Difficulty of Paying Living Expenses: Not hard at all  Food Insecurity: No Food Insecurity   Worried About Charity fundraiser in the Last Year: Never true   Ran Out of Food in the Last Year: Never true  Transportation Needs: No Transportation Needs   Lack of Transportation (Medical): No   Lack of Transportation (Non-Medical): No  Physical Activity: Inactive   Days of Exercise per Week: 0 days   Minutes of Exercise per Session: 0 min  Stress: No Stress Concern Present   Feeling of Stress : Not at all  Social Connections: Not on file  Intimate Partner Violence: Not on file   Current Outpatient Medications on File Prior to Visit  Medication Sig Dispense Refill   ACCU-CHEK AVIVA PLUS test strip TEST THREE TIMES DAILY AS DIRECTED 100 strip 11   amLODipine (NORVASC) 10 MG tablet TAKE 1 TABLET BY MOUTH EVERY DAY 90 tablet 1   aspirin 325 MG EC tablet Take 325 mg by mouth daily.     azithromycin (ZITHROMAX) 250 MG tablet Take 250 mg by mouth as directed.     B-D UF III MINI PEN NEEDLES 31G X 5 MM MISC Use as directed 4x a day 300 each 3    benzonatate (TESSALON) 100 MG capsule Take by mouth 3 (three) times daily as needed for cough.     blood glucose meter kit and supplies Dispense based on patient and insurance preference. Use up to four times daily as directed.dx code e11.65 1 each 0   Blood Glucose Monitoring Suppl (TRUE METRIX METER) DEVI Use to check blood sugars 3 times a day. 1 each 3   cetirizine (ZYRTEC ALLERGY) 10 MG tablet Take 1 tablet (10 mg total) by mouth daily. 90 tablet 1   fluticasone (FLONASE) 50 MCG/ACT nasal spray Place 2 sprays into both nostrils daily. 18.2 mL  2   furosemide (LASIX) 20 MG tablet Take 1 tablet (20 mg total) by mouth as needed. 90 tablet 3   glucose blood (TRUE METRIX BLOOD GLUCOSE TEST) test strip Use as instructed to check blood sugar 3 times a day. Dx code e11.65 100 each 12   hydrALAZINE (APRESOLINE) 25 MG tablet TAKE 1 TABLET(25 MG) BY MOUTH TWICE DAILY 180 tablet 1   hydrOXYzine (ATARAX/VISTARIL) 25 MG tablet Take 1 tablet by mouth at bedtime as needed. 1-2 tablets at bedtime prn     insulin lispro (HUMALOG KWIKPEN) 200 UNIT/ML KwikPen Inject under skin 8-10 units 15 min before the 3 main meals 9 mL 3   Lancets (ACCU-CHEK SOFT TOUCH) lancets Check blood sugars 3 times daily E11.65 100 each 12   latanoprost (XALATAN) 0.005 % ophthalmic solution Place 1 drop into both eyes at bedtime.      meclizine (ANTIVERT) 25 MG tablet TAKE 1 TABLET BY MOUTH THREE TIMES DAILY AS NEEDED FOR DIZZINESS 30 tablet 2   metoprolol succinate (TOPROL-XL) 25 MG 24 hr tablet TAKE 1 TABLET BY MOUTH EVERY DAY 90 tablet 1   Multiple Vitamin (MULTIVITAMIN) tablet Take 1 tablet by mouth daily.     rosuvastatin (CRESTOR) 10 MG tablet TAKE 1 TABLET BY MOUTH EVERY DAY 90 tablet 1   Semaglutide, 1 MG/DOSE, (OZEMPIC, 1 MG/DOSE,) 2 MG/1.5ML SOPN Inject 1 mg into the skin once a week. 6 pen 3   TRESIBA FLEXTOUCH 200 UNIT/ML FlexTouch Pen INJECT 28 UNITS INTO THE SKIN AT BEDTIME (Patient taking differently: Inject 30 Units into  the skin at bedtime.) 15 mL 1   No current facility-administered medications on file prior to visit.   Allergies  Allergen Reactions   Brimonidine Other (See Comments)    Eyes swell and itch   Dorzolamide     Other reaction(s): Other (See Comments) Red, inflamed eyelid, dermatitis    Timolol Other (See Comments)    Caused eyes to swell and itch   Family History  Problem Relation Age of Onset   Hypertension Mother    Hypertension Father    Heart Problems Father    Hypertension Sister    Diabetes Sister    Hypertension Brother    Diabetes Brother    PE: BP 132/72 (BP Location: Right Arm, Patient Position: Sitting, Cuff Size: Normal)    Pulse 70    Ht _0  (1.575 m)    Wt 157 lb 9.6 oz (71.5 kg)    SpO2 97%    BMI 28.83 kg/m   Wt Readings from Last 3 Encounters:  05/20/21 157 lb 9.6 oz (71.5 kg)  04/01/21 158 lb 3.2 oz (71.8 kg)  03/08/21 152 lb 9.6 oz (69.2 kg)   Constitutional: + Slightly overweight, in NAD Eyes: PERRLA, EOMI, no exophthalmos ENT: moist mucous membranes, no thyromegaly, no cervical lymphadenopathy Cardiovascular: RRR, No MRG Respiratory: CTA B Musculoskeletal: no deformities, strength intact in all 4 Skin: moist, warm, no rashes Neurological: no tremor with outstretched hands, DTR normal in all 4  ASSESSMENT: 1. DM2, insulin-dependent, uncontrolled, with long-term complications - dCHF - Cerebrovascular disease, history of CVA 2002 - CKD - DR - PN  2. HL  3.  Overweight  PLAN:  1. Patient with longstanding, uncontrolled, type 2 diabetes, on Basal insulin regimen and Ozempic, returning after long absence of more than a year.  At last visit, we stopped her sulfonylurea and I suggested to add mealtime insulin as the blood sugars were at goal in the  morning but they were increasing later in the day, to the 200-300s. HbA1c at last visit was 10.4% and Unfortunately, 3 months ago. it remained elevated, with the latest being 10.5%. -At today's visit,  patient is not sure exactly what medication she was taking in the last year.  It is unclear whether she started Humalog at our last visit.  She is confused about which medicines were stopped by cardiology.  However, it appears that she is taking [place] at 28 units and Ozempic probably at 0.5 mg weekly (did not increase the dose at last visit as advised). -Sugars are much better after her daughter started her on a diet.  They are at goal in the morning and even had a few lows in the 50s.  Therefore, at today's visit I advised her to reduce the Tresiba dose.  I do not feel that she absolutely needs Humalog for now, but we do need more information about her sugars later in the day to see if this is the case.  Since the HbA1c is still high today, will increase the dose of Ozempic to 1 mg weekly. - I suggested to:  Patient Instructions  Please increase: - Ozempic 1 mg weekly  Please decrease: - Tresiba 24 units daily in a.m  Please come back for a follow-up appointment in 3 months.  - we checked her HbA1c: 9.6% (lower) - advised to check sugars at different times of the day - 4x a day, rotating check times - advised for yearly eye exams >> she is UTD - return to clinic in 3-4 months  2. HL -Reviewed latest lipid panel from 02/2021: LDL above our target of less than 55, TG high, HDL low: Lab Results  Component Value Date   CHOL 169 02/17/2021   HDL 39 (L) 02/17/2021   LDLCALC 97 02/17/2021   TRIG 190 (H) 02/17/2021   CHOLHDL 4.3 02/17/2021  -In the past I advised her to increase the dose of Crestor from 10 mg to 20 mg daily, but she did not do so...  3.  Overweight -We will continue Ozempic which should also help with weight loss.  We will increase the dose today -At our last in person visit, she weighed 151 pounds.  She had gained 2 pounds from the previous visit. -She gained 6 pounds since then  Philemon Kingdom, MD PhD [facility] Endocrinology

## 2021-05-20 NOTE — Patient Instructions (Addendum)
Please increase: - Ozempic 1 mg weekly  Please decrease: - Tresiba 24 units daily in a.m  Please come back for a follow-up appointment in 3 months.

## 2021-06-01 ENCOUNTER — Encounter: Payer: Self-pay | Admitting: Internal Medicine

## 2021-06-01 ENCOUNTER — Ambulatory Visit (INDEPENDENT_AMBULATORY_CARE_PROVIDER_SITE_OTHER): Payer: Medicare Other | Admitting: Internal Medicine

## 2021-06-01 ENCOUNTER — Other Ambulatory Visit: Payer: Self-pay

## 2021-06-01 VITALS — BP 130/70 | HR 59 | Temp 98.2°F | Ht 62.0 in | Wt 159.2 lb

## 2021-06-01 DIAGNOSIS — E663 Overweight: Secondary | ICD-10-CM | POA: Diagnosis not present

## 2021-06-01 DIAGNOSIS — E1165 Type 2 diabetes mellitus with hyperglycemia: Secondary | ICD-10-CM | POA: Diagnosis not present

## 2021-06-01 DIAGNOSIS — Z23 Encounter for immunization: Secondary | ICD-10-CM

## 2021-06-01 DIAGNOSIS — E1159 Type 2 diabetes mellitus with other circulatory complications: Secondary | ICD-10-CM | POA: Diagnosis not present

## 2021-06-01 MED ORDER — GVOKE HYPOPEN 2-PACK 0.5 MG/0.1ML ~~LOC~~ SOAJ: 0.5000 mg | Freq: Every day | SUBCUTANEOUS | 3 refills | Status: DC | PRN

## 2021-06-01 NOTE — Progress Notes (Signed)
Rich Brave [place],acting as a Education administrator for Maximino Greenland, MD.,have documented all relevant documentation on the behalf of Maximino Greenland, MD,as directed by  Maximino Greenland, MD while in the presence of Maximino Greenland, MD.  This visit occurred during the SARS-CoV-2 public health emergency.  Safety protocols were in place, including screening questions prior to the visit, additional usage of staff PPE, and extensive cleaning of exam room while observing appropriate contact time as indicated for disinfecting solutions.  Subjective:     Patient ID: [name] [name] , female    DOB: 1940-05-25 , 81 y.o.   MRN: [id number]   Chief Complaint  Patient presents with   Diabetes    HPI  Patient presents today for a diabetes f/u.  She is accompanied by her daughter, [name], . She reports her blood sugars have been running high and low.  She states she has been shaky in the mornings due to low blood sugars; however, her sugars are elevated throughout the day. Patient admits that she is taking her meds regularly; however, it appears that she may not be taking the Humalog prior to breakfast, lunch and dinner.  She does admit to taking Tresiba nightly.    Diabetes She presents for her follow-up diabetic visit. She has type 2 diabetes mellitus. Hypoglycemia symptoms include tremors. There are no hypoglycemic complications. Diabetic complications include nephropathy. Risk factors for coronary artery disease include diabetes mellitus, dyslipidemia, hypertension, post-menopausal and sedentary lifestyle. Current diabetic treatment includes insulin injections and oral agent (monotherapy). She participates in exercise intermittently. An ACE inhibitor/angiotensin II receptor blocker is being taken. Eye exam is current.  Hypertension This is a chronic problem. The current episode started more than 1 year ago. The problem has been gradually improving since onset. The problem is controlled. Risk factors for coronary  artery disease include diabetes mellitus, dyslipidemia, post-menopausal state and sedentary lifestyle. The current treatment provides moderate improvement. Compliance problems include exercise.  Hypertensive end-organ damage includes kidney disease.    Past Medical History:  Diagnosis Date   Diabetes mellitus ([place])    H/O: CVA (cerebrovascular accident) 2002   HTN (hypertension)    Hyperlipidemia    Pulmonary HTN ([facility])    mild by echo     Family History  Problem Relation Age of Onset   Hypertension Mother    Hypertension Father    Heart Problems Father    Hypertension Sister    Diabetes Sister    Hypertension Brother    Diabetes Brother      Current Outpatient Medications:    ACCU-CHEK AVIVA PLUS test strip, TEST THREE TIMES DAILY AS DIRECTED, Disp: 100 strip, Rfl: 11   amLODipine (NORVASC) 10 MG tablet, TAKE 1 TABLET BY MOUTH EVERY DAY, Disp: 90 tablet, Rfl: 1   aspirin 325 MG EC tablet, Take 325 mg by mouth daily., Disp: , Rfl:    B-D UF III MINI PEN NEEDLES 31G X 5 MM MISC, Use as directed 4x a day, Disp: 300 each, Rfl: 3   blood glucose meter kit and supplies, Dispense based on patient and insurance preference. Use up to four times daily as directed.dx code e11.65, Disp: 1 each, Rfl: 0   Blood Glucose Monitoring Suppl (TRUE METRIX METER) DEVI, Use to check blood sugars 3 times a day., Disp: 1 each, Rfl: 3   cetirizine (ZYRTEC ALLERGY) 10 MG tablet, Take 1 tablet (10 mg total) by mouth daily., Disp: 90 tablet, Rfl: 1   Cholecalciferol (VITAMIN D3) 50  MCG (2000 UT) TABS, Take 1 tablet by mouth daily at 12 noon., Disp: , Rfl:    Ferrous Sulfate (IRON PO), Take 65 mg by mouth daily at 2 am., Disp: , Rfl:    fluticasone (FLONASE) 50 MCG/ACT nasal spray, Place 2 sprays into both nostrils daily., Disp: 18.2 mL, Rfl: 2   furosemide (LASIX) 20 MG tablet, Take 1 tablet (20 mg total) by mouth as needed., Disp: 90 tablet, Rfl: 3   Glucagon (GVOKE HYPOPEN 2-PACK) 0.5 MG/0.1ML SOAJ, Inject  0.5 mg into the skin daily as needed., Disp: 0.1 mL, Rfl: 3   glucose blood (TRUE METRIX BLOOD GLUCOSE TEST) test strip, Use as instructed to check blood sugar 3 times a day. Dx code e11.65, Disp: 100 each, Rfl: 12   hydrALAZINE (APRESOLINE) 25 MG tablet, TAKE 1 TABLET(25 MG) BY MOUTH TWICE DAILY, Disp: 180 tablet, Rfl: 1   insulin degludec (TRESIBA FLEXTOUCH) 200 UNIT/ML FlexTouch Pen, Inject 24 Units into the skin daily. (Patient taking differently: Inject 24 Units into the skin daily. Pt taking as needed), Disp: 9 mL, Rfl: 1   insulin lispro (HUMALOG KWIKPEN) 200 UNIT/ML KwikPen, Inject under skin 8-10 units 15 min before the 3 main meals, Disp: 9 mL, Rfl: 3   Lancets (ACCU-CHEK SOFT TOUCH) lancets, Check blood sugars 3 times daily E11.65, Disp: 100 each, Rfl: 12   metoprolol succinate (TOPROL-XL) 25 MG 24 hr tablet, TAKE 1 TABLET BY MOUTH EVERY DAY, Disp: 90 tablet, Rfl: 1   Multiple Vitamin (MULTIVITAMIN) tablet, Take 1 tablet by mouth daily., Disp: , Rfl:    Semaglutide, 1 MG/DOSE, (OZEMPIC, 1 MG/DOSE,) 2 MG/1.5ML SOPN, Inject 1 mg into the skin once a week., Disp: 4.5 mL, Rfl: 1   telmisartan-hydrochlorothiazide (MICARDIS HCT) 80-25 MG tablet, Take 1 tablet by mouth daily., Disp: , Rfl:    hydrOXYzine (ATARAX/VISTARIL) 25 MG tablet, Take 1 tablet by mouth at bedtime as needed. 1-2 tablets at bedtime prn (Patient not taking: Reported on 06/01/2021), Disp: , Rfl:    latanoprost (XALATAN) 0.005 % ophthalmic solution, Place 1 drop into both eyes at bedtime. , Disp: , Rfl:    rosuvastatin (CRESTOR) 10 MG tablet, TAKE 1 TABLET BY MOUTH EVERY DAY (Patient not taking: Reported on 06/01/2021), Disp: 90 tablet, Rfl: 1   Allergies  Allergen Reactions   Brimonidine Other (See Comments)    Eyes swell and itch   Dorzolamide     Other reaction(s): Other (See Comments) Red, inflamed eyelid, dermatitis    Timolol Other (See Comments)    Caused eyes to swell and itch     Review of Systems  Neurological:   Positive for tremors.    Today's Vitals   06/01/21 0848  BP: 130/70  Pulse: (!) 59  Temp: 98.2 F (36.8 C)  Weight: 159 lb 3.2 oz (72.2 kg)  Height: _0  (1.575 m)  PainSc: 0-No pain   Body mass index is 29.12 kg/m.  Wt Readings from Last 3 Encounters:  06/01/21 159 lb 3.2 oz (72.2 kg)  05/20/21 157 lb 9.6 oz (71.5 kg)  04/01/21 158 lb 3.2 oz (71.8 kg)     Objective:  Physical Exam Vitals and nursing note reviewed.  Constitutional:      Appearance: Normal appearance.  HENT:     Head: Normocephalic and atraumatic.     Nose:     Comments: Masked     Mouth/Throat:     Comments: Masked  Eyes:     Extraocular Movements: Extraocular movements  intact.     Comments: Left eye ptosis  Cardiovascular:     Rate and Rhythm: Regular rhythm.     Heart sounds: Normal heart sounds.  Pulmonary:     Effort: Pulmonary effort is normal.     Breath sounds: Normal breath sounds.  Abdominal:     General: Bowel sounds are normal.  Musculoskeletal:     Cervical back: Normal range of motion.  Skin:    General: Skin is warm.  Neurological:     General: No focal deficit present.     Mental Status: She is alert.  Psychiatric:        Mood and Affect: Mood normal.        Behavior: Behavior normal.        Assessment And Plan:     1. Poorly controlled type 2 diabetes mellitus with circulatory disorder ([place]) Comments: Endo note reviewed, last a1c 9.6. Pt and daughter agree to home health nursing evaluation for education, medication changes/mgmt. I will also send a prescription for Gvoke, glucagon pen. Patient and daughter advised this is to be used for low blood sugars, below 60-65.  - Ambulatory referral to Home Health  2. Immunization due Comments: She was given Tdap to update immunization history. This will be billed through [employer].   3. Overweight (BMI 25.0-29.9) Comments: Her BMI is acceptable for her demographic. She is encouraged to gradually increase her daily activity,  including chair exercises.    Patient was given opportunity to ask questions. Patient verbalized understanding of the plan and was able to repeat key elements of the plan. All questions were answered to their satisfaction.   I, Maximino Greenland, MD, have reviewed all documentation for this visit. The documentation on 06/01/21 for the exam, diagnosis, procedures, and orders are all accurate and complete.   IF YOU HAVE BEEN REFERRED TO A SPECIALIST, IT MAY TAKE 1-2 WEEKS TO SCHEDULE/PROCESS THE REFERRAL. IF YOU HAVE NOT HEARD FROM US/SPECIALIST IN TWO WEEKS, PLEASE GIVE [place] A CALL AT [phone].   THE PATIENT IS ENCOURAGED TO PRACTICE SOCIAL DISTANCING DUE TO THE COVID-19 PANDEMIC.

## 2021-06-01 NOTE — Patient Instructions (Signed)

## 2021-06-02 DIAGNOSIS — Z794 Long term (current) use of insulin: Secondary | ICD-10-CM | POA: Diagnosis not present

## 2021-06-02 DIAGNOSIS — E119 Type 2 diabetes mellitus without complications: Secondary | ICD-10-CM | POA: Diagnosis not present

## 2021-06-08 ENCOUNTER — Encounter (HOSPITAL_COMMUNITY)
Admission: RE | Admit: 2021-06-08 | Discharge: 2021-06-08 | Disposition: A | Discharge status: Home / Self Care | Payer: Medicare Other | Source: Ambulatory Visit | Attending: Nephrology | Admitting: Nephrology

## 2021-06-08 ENCOUNTER — Encounter (HOSPITAL_COMMUNITY): Payer: Self-pay

## 2021-06-08 DIAGNOSIS — N189 Chronic kidney disease, unspecified: Secondary | ICD-10-CM | POA: Diagnosis present

## 2021-06-08 DIAGNOSIS — D631 Anemia in chronic kidney disease: Secondary | ICD-10-CM | POA: Insufficient documentation

## 2021-06-08 MED ORDER — SODIUM CHLORIDE 0.9 % IV SOLN: Freq: Once | INTRAVENOUS | Status: AC

## 2021-06-08 MED ORDER — SODIUM CHLORIDE 0.9 % IV SOLN
510.0000 mg | Freq: Once | INTRAVENOUS | Status: AC
  Administered 2021-06-08: 510 mg via INTRAVENOUS
  Filled 2021-06-08: qty 510

## 2021-06-14 ENCOUNTER — Telehealth: Payer: Self-pay

## 2021-06-14 DIAGNOSIS — E1159 Type 2 diabetes mellitus with other circulatory complications: Secondary | ICD-10-CM

## 2021-06-14 MED ORDER — HUMALOG KWIKPEN 200 UNIT/ML ~~LOC~~ SOPN: PEN_INJECTOR | SUBCUTANEOUS | 1 refills | Status: DC

## 2021-06-14 NOTE — Telephone Encounter (Signed)
Pt's daughter called and left a message advising the pharmacy needed a new Rx sent in. ? ?Called and confirmed with the pharmacy a rx was sent 05/20/21 for [place] with a  refill and a new Rx should not be needed. ? ?Pharmacy confirmed a new Rx was not needed for [place] and they can fill pt's Rx by 3. ? ?Pt contacted and advised pharmacy is filling Rx for [place]. Pt advised she needed a refill on Humalog, new Rx sent to preferred pharmacy.  ?

## 2021-06-15 ENCOUNTER — Encounter (HOSPITAL_COMMUNITY)
Admission: RE | Admit: 2021-06-15 | Discharge: 2021-06-15 | Disposition: A | Discharge status: Home / Self Care | Payer: Medicare Other | Source: Ambulatory Visit | Attending: Nephrology | Admitting: Nephrology

## 2021-06-15 DIAGNOSIS — N189 Chronic kidney disease, unspecified: Secondary | ICD-10-CM | POA: Diagnosis not present

## 2021-06-15 MED ORDER — SODIUM CHLORIDE 0.9 % IV SOLN
510.0000 mg | Freq: Once | INTRAVENOUS | Status: AC
  Administered 2021-06-15: 510 mg via INTRAVENOUS
  Filled 2021-06-15: qty 510

## 2021-06-15 MED ORDER — SODIUM CHLORIDE 0.9 % IV SOLN: INTRAVENOUS | Status: DC

## 2021-06-23 DIAGNOSIS — Z7984 Long term (current) use of oral hypoglycemic drugs: Secondary | ICD-10-CM | POA: Diagnosis not present

## 2021-06-23 DIAGNOSIS — H401133 Primary open-angle glaucoma, bilateral, severe stage: Secondary | ICD-10-CM | POA: Diagnosis not present

## 2021-06-23 DIAGNOSIS — H2102 Hyphema, left eye: Secondary | ICD-10-CM | POA: Diagnosis not present

## 2021-06-23 DIAGNOSIS — E113311 Type 2 diabetes mellitus with moderate nonproliferative diabetic retinopathy with macular edema, right eye: Secondary | ICD-10-CM | POA: Diagnosis not present

## 2021-06-23 DIAGNOSIS — H5462 Unqualified visual loss, left eye, normal vision right eye: Secondary | ICD-10-CM | POA: Diagnosis not present

## 2021-06-23 DIAGNOSIS — H4052X3 Glaucoma secondary to other eye disorders, left eye, severe stage: Secondary | ICD-10-CM | POA: Diagnosis not present

## 2021-06-29 ENCOUNTER — Other Ambulatory Visit: Payer: Self-pay

## 2021-06-29 DIAGNOSIS — E1159 Type 2 diabetes mellitus with other circulatory complications: Secondary | ICD-10-CM

## 2021-06-29 MED ORDER — BD PEN NEEDLE MINI U/F 31G X 5 MM MISC: 3 refills | Status: DC

## 2021-07-01 ENCOUNTER — Encounter: Payer: Self-pay | Admitting: Cardiovascular Disease

## 2021-07-01 ENCOUNTER — Other Ambulatory Visit: Payer: Self-pay

## 2021-07-01 ENCOUNTER — Ambulatory Visit: Payer: Medicare Other | Admitting: Cardiovascular Disease

## 2021-07-01 DIAGNOSIS — I10 Essential (primary) hypertension: Secondary | ICD-10-CM | POA: Diagnosis not present

## 2021-07-01 DIAGNOSIS — I5189 Other ill-defined heart diseases: Secondary | ICD-10-CM

## 2021-07-01 DIAGNOSIS — E785 Hyperlipidemia, unspecified: Secondary | ICD-10-CM

## 2021-07-01 DIAGNOSIS — E118 Type 2 diabetes mellitus with unspecified complications: Secondary | ICD-10-CM

## 2021-07-01 DIAGNOSIS — I3481 Nonrheumatic mitral (valve) annulus calcification: Secondary | ICD-10-CM | POA: Diagnosis not present

## 2021-07-01 DIAGNOSIS — Z79899 Other long term (current) drug therapy: Secondary | ICD-10-CM

## 2021-07-01 DIAGNOSIS — Z794 Long term (current) use of insulin: Secondary | ICD-10-CM

## 2021-07-01 DIAGNOSIS — E1169 Type 2 diabetes mellitus with other specified complication: Secondary | ICD-10-CM

## 2021-07-01 DIAGNOSIS — N184 Chronic kidney disease, stage 4 (severe): Secondary | ICD-10-CM | POA: Diagnosis not present

## 2021-07-01 MED ORDER — HYDRALAZINE HCL 25 MG PO TABS: 37.5000 mg | ORAL_TABLET | Freq: Two times a day (BID) | ORAL | 1 refills | Status: DC

## 2021-07-01 NOTE — Patient Instructions (Signed)
Medication Instructions:  ?TODAY ONLY TAKE YOUR HYDRALAZINE THREE TIMES TODAY ? ?*If you need a refill on your cardiac medications before your next appointment, please call your pharmacy* ? ?Lab Work:    ?FASTING LIPID AND CMET IN 6 WEEKS (about 08-12-21) ? ?Follow-Up: ?Your next appointment:  4 month(s) In Person with DR Claiborne Billings   ? ?At [facility], you and your health needs are our priority.  As part of our continuing mission to provide you with exceptional heart care, we have created designated Provider Care Teams.  These Care Teams include your primary Cardiologist (physician) and Advanced Practice Providers (APPs -  Physician Assistants and Nurse Practitioners) who all work together to provide you with the care you need, when you need it. ? ?We recommend signing up for the patient portal called "MyChart".  Sign up information is provided on this After Visit Summary.  MyChart is used to connect with patients for Virtual Visits (Telemedicine).  Patients are able to view lab/test results, encounter notes, upcoming appointments, etc.  Non-urgent messages can be sent to your provider as well.   ?To learn more about what you can do with MyChart, go to [email or web address].   ? ? ?

## 2021-07-01 NOTE — Progress Notes (Signed)
? ?Cardiology Office Note   ? ?Date:  07/11/2021  ? ?ID:  [name] [name], DOB 02/03/1941, MRN [id number] ? ?PCP:  Sanders, Robyn, MD  ?Cardiologist:   , MD  ? ?4 month F/U ? ?History of Present Illness:  ?[name] [name] is a 81 y.o. female who has a remote history of CVA which she suffered in 2002. In 2004 Dr. Gamble performed cardiac catheterization which revealed normal coronary arteries. In May 2008 a nuclear perfusion study showed normal perfusion. An echo Doppler study in November 2011 revealed asymmetric left ventricular hypertrophy with grade 1 diastolic dysfunction and increased LA pressure. She did have mild to moderate mitral annular calcification with trace MR and mild pulmonary hypertension. ?  ?She has had diabetes that have been out of control and last year her hemoglobin and see if there is a 12.3. She also has a history of hyperlipidemia. ?  ?I saw her in 2014 at which time she denied any episodes of chest pain or shortness of breath but admitted to some blood pressure lability.   ? ?I saw her on April 01, 2021 at which time she was referred to me by Dr. Sanders to reestablish cardiology care.  She is followed by Dr. Robyn Sanders for diabetes mellitus, hypertension, and she now has CKD and is seeing a nephrologist in [place].  She has recently noticed some increasing shortness of breath as well as some ankle swelling.  Reportedly she had a "bad cold "after Thanksgiving.  She has noticed progressive dyspnea on exertion.  She denied any presyncope or syncope.  She was unaware of any arrhythmias.  During that evaluation, I was concerned that her creatinine had progressively increased to 2.77 from 1.56 in November 2020, 1.8 in June 2021, 2.55 in December 2021 and 2.37 in June 2022.  With her rise to 2.77 I temporarily recommended she hold her telmisartan HCT until she was reevaluated by her nephrologist in several weeks.  Because her blood pressure was elevated I recommended  increasing hydralazine from 25 mg twice a day to 50 mg twice a day and gave her a prescription for furosemide due to leg edema to take as needed and wear support stockings.  I recommended an echo Doppler assessment. ? ?An echo Doppler study was done on April 05, 2021 which showed normal LV function with a EF hyperdynamic at 65 to 70%.  She has significant mitral annular calcification with mild mitral gradient at 4 mm suggesting mild mitral stenosis.  There was grade 1 diastolic dysfunction. ? ?She subsequently was seen by Dr. Cole Denardo and has undergone iron infusion for iron deficiency.  He concurred with the discontinuance of telmisartan. ? ?Presently she is on amlodipine 10 mg, hydralazine 25 mg twice a day, metoprolol succinate 25 mg daily.  She is on Ozempic weekly in addition to insulin.  She has been taking furosemide as needed.  She is on rosuvastatin 10 mg for hyperlipidemia.  She presents for reevaluation. ? ?Past Medical History:  ?Diagnosis Date  ? Diabetes mellitus ([facility])   ? H/O: CVA (cerebrovascular accident) 2002  ? HTN (hypertension)   ? Hyperlipidemia   ? Pulmonary HTN ([facility])   ? mild by echo  ? ? ?Past Surgical History:  ?Procedure Laterality Date  ? ABDOMINAL HYSTERECTOMY    ? CARDIAC CATHETERIZATION  03/17/2003  ? Normal, patent coronary arteries. Normal LV systolic function  ? CARDIOVASCULAR STRESS TEST  08/28/2006  ? Small sized, moderate ischemiain the Basal Inferoseptal, Basal Inferior,   Mid Inferior and Apical Inferior regions  ? TRANSTHORACIC ECHOCARDIOGRAM  02/17/2010  ? EF >55%, moderate-severely thickened septum, mild-moderate mitral annular calcification  ? ? ?Current Medications: ?Outpatient Medications Prior to Visit  ?Medication Sig Dispense Refill  ? ACCU-CHEK AVIVA PLUS test strip TEST THREE TIMES DAILY AS DIRECTED 100 strip 11  ? amLODipine (NORVASC) 10 MG tablet TAKE 1 TABLET BY MOUTH EVERY DAY 90 tablet 1  ? aspirin 325 MG EC tablet Take 325 mg by mouth daily.    ? B-D UF III  MINI PEN NEEDLES 31G X 5 MM MISC Use as directed 4x a day 300 each 3  ? blood glucose meter kit and supplies Dispense based on patient and insurance preference. Use up to four times daily as directed.dx code e11.65 1 each 0  ? Blood Glucose Monitoring Suppl (TRUE METRIX METER) DEVI Use to check blood sugars 3 times a day. 1 each 3  ? cetirizine (ZYRTEC ALLERGY) 10 MG tablet Take 1 tablet (10 mg total) by mouth daily. 90 tablet 1  ? Cholecalciferol (VITAMIN D3) 50 MCG (2000 UT) TABS Take 1 tablet by mouth daily at 12 noon.    ? Ferrous Sulfate (IRON PO) Take 65 mg by mouth daily at 2 am.    ? fluticasone (FLONASE) 50 MCG/ACT nasal spray Place 2 sprays into both nostrils daily. 18.2 mL 2  ? Glucagon (GVOKE HYPOPEN 2-PACK) 0.5 MG/0.1ML SOAJ Inject 0.5 mg into the skin daily as needed. 0.1 mL 3  ? glucose blood (TRUE METRIX BLOOD GLUCOSE TEST) test strip Use as instructed to check blood sugar 3 times a day. Dx code e11.65 100 each 12  ? hydrOXYzine (ATARAX/VISTARIL) 25 MG tablet Take 1 tablet by mouth at bedtime as needed. 1-2 tablets at bedtime prn    ? insulin degludec (TRESIBA FLEXTOUCH) 200 UNIT/ML FlexTouch Pen Inject 24 Units into the skin daily. (Patient taking differently: Inject 24 Units into the skin daily. Pt taking as needed) 9 mL 1  ? insulin lispro (HUMALOG KWIKPEN) 200 UNIT/ML KwikPen Inject under skin 8-10 units 15 min before the 3 main meals 9 mL 1  ? Lancets (ACCU-CHEK SOFT TOUCH) lancets Check blood sugars 3 times daily E11.65 100 each 12  ? latanoprost (XALATAN) 0.005 % ophthalmic solution Place 1 drop into both eyes at bedtime.     ? metoprolol succinate (TOPROL-XL) 25 MG 24 hr tablet TAKE 1 TABLET BY MOUTH EVERY DAY 90 tablet 1  ? Multiple Vitamin (MULTIVITAMIN) tablet Take 1 tablet by mouth daily.    ? rosuvastatin (CRESTOR) 10 MG tablet TAKE 1 TABLET BY MOUTH EVERY DAY 90 tablet 1  ? Semaglutide, 1 MG/DOSE, (OZEMPIC, 1 MG/DOSE,) 2 MG/1.5ML SOPN Inject 1 mg into the skin once a week. 4.5 mL 1  ?  hydrALAZINE (APRESOLINE) 25 MG tablet TAKE 1 TABLET(25 MG) BY MOUTH TWICE DAILY 180 tablet 1  ? telmisartan-hydrochlorothiazide (MICARDIS HCT) 80-25 MG tablet Take 1 tablet by mouth daily.    ? furosemide (LASIX) 20 MG tablet Take 1 tablet (20 mg total) by mouth as needed. 90 tablet 3  ? ?No facility-administered medications prior to visit.  ?  ? ?Allergies:   Brimonidine, Dorzolamide, and Timolol  ? ?Social History  ? ?Socioeconomic History  ? Marital status: Married  ?  Spouse name: Not on file  ? Number of children: Not on file  ? Years of education: Not on file  ? Highest education level: Not on file  ?Occupational History  ? Occupation:  retired  ?Tobacco Use  ? Smoking status: Never  ? Smokeless tobacco: Never  ?Vaping Use  ? Vaping Use: Never used  ?Substance and Sexual Activity  ? Alcohol use: No  ? Drug use: Never  ? Sexual activity: Not Currently  ?Other Topics Concern  ? Not on file  ?Social History Narrative  ? Not on file  ? ?Social Determinants of Health  ? ?Financial Resource Strain: Low Risk   ? Difficulty of Paying Living Expenses: Not hard at all  ?Food Insecurity: No Food Insecurity  ? Worried About Running Out of Food in the Last Year: Never true  ? Ran Out of Food in the Last Year: Never true  ?Transportation Needs: No Transportation Needs  ? Lack of Transportation (Medical): No  ? Lack of Transportation (Non-Medical): No  ?Physical Activity: Inactive  ? Days of Exercise per Week: 0 days  ? Minutes of Exercise per Session: 0 min  ?Stress: No Stress Concern Present  ? Feeling of Stress : Not at all  ?Social Connections: Not on file  ?  ?Socially she is married for 65 years.  She has 8 children, 10 grandchildren and 10 great-grandchildren.  She is retired.  She completed 10th grade of education.  There is no tobacco history.  She has approximately 1 drink per month.  She tries to exercise at least 1 day/week. ? ? ?Family History:  The patient's family history includes Diabetes in her brother and  sister; Heart Problems in her father; Hypertension in her brother, father, mother, and sister.  ? ?ROS ?General: Negative; No fevers, chills, or night sweats;  ?HEENT: Negative; No changes in vision or hearing,

## 2021-07-04 DIAGNOSIS — I129 Hypertensive chronic kidney disease with stage 1 through stage 4 chronic kidney disease, or unspecified chronic kidney disease: Secondary | ICD-10-CM | POA: Diagnosis not present

## 2021-07-04 DIAGNOSIS — D631 Anemia in chronic kidney disease: Secondary | ICD-10-CM | POA: Diagnosis not present

## 2021-07-04 DIAGNOSIS — E1122 Type 2 diabetes mellitus with diabetic chronic kidney disease: Secondary | ICD-10-CM | POA: Diagnosis not present

## 2021-07-04 DIAGNOSIS — I5032 Chronic diastolic (congestive) heart failure: Secondary | ICD-10-CM | POA: Diagnosis not present

## 2021-07-04 DIAGNOSIS — R801 Persistent proteinuria, unspecified: Secondary | ICD-10-CM | POA: Diagnosis not present

## 2021-07-04 DIAGNOSIS — N179 Acute kidney failure, unspecified: Secondary | ICD-10-CM | POA: Diagnosis not present

## 2021-07-04 DIAGNOSIS — N184 Chronic kidney disease, stage 4 (severe): Secondary | ICD-10-CM | POA: Diagnosis not present

## 2021-07-04 DIAGNOSIS — N189 Chronic kidney disease, unspecified: Secondary | ICD-10-CM | POA: Diagnosis not present

## 2021-07-04 LAB — IRON,TIBC AND FERRITIN PANEL
%SAT: 42
Ferritin: 664
Iron: 104
TIBC: 249
UIBC: 145

## 2021-07-04 LAB — CBC AND DIFFERENTIAL
HCT: 36 (ref 36–46)
Hemoglobin: 11.4 — AB (ref 12.0–16.0)
Platelets: 286 10*3/uL (ref 150–400)
WBC: 9.9

## 2021-07-04 LAB — CBC: RBC: 3.96 (ref 3.87–5.11)

## 2021-07-04 LAB — BASIC METABOLIC PANEL
BUN: 76 — AB (ref 4–21)
CO2: 21 (ref 13–22)
Chloride: 102 (ref 99–108)
Creatinine: 2.8 — AB (ref 0.5–1.1)
Glucose: 158

## 2021-07-04 LAB — COMPREHENSIVE METABOLIC PANEL
Albumin: 4 (ref 3.5–5.0)
Calcium: 9.4 (ref 8.7–10.7)

## 2021-07-11 ENCOUNTER — Encounter: Payer: Self-pay | Admitting: Internal Medicine

## 2021-07-11 ENCOUNTER — Encounter: Payer: Self-pay | Admitting: Cardiovascular Disease

## 2021-07-27 ENCOUNTER — Ambulatory Visit: Payer: Medicare Other | Admitting: Internal Medicine

## 2021-07-27 ENCOUNTER — Encounter: Payer: Self-pay | Admitting: Nurse Practitioner

## 2021-07-27 ENCOUNTER — Telehealth: Payer: Self-pay

## 2021-07-27 ENCOUNTER — Ambulatory Visit (INDEPENDENT_AMBULATORY_CARE_PROVIDER_SITE_OTHER): Payer: Medicare Other | Admitting: Nurse Practitioner

## 2021-07-27 VITALS — HR 77 | Temp 98.4°F | Ht 62.0 in | Wt 157.0 lb

## 2021-07-27 DIAGNOSIS — E1165 Type 2 diabetes mellitus with hyperglycemia: Secondary | ICD-10-CM | POA: Diagnosis not present

## 2021-07-27 DIAGNOSIS — E1159 Type 2 diabetes mellitus with other circulatory complications: Secondary | ICD-10-CM

## 2021-07-27 DIAGNOSIS — E162 Hypoglycemia, unspecified: Secondary | ICD-10-CM

## 2021-07-27 DIAGNOSIS — R253 Fasciculation: Secondary | ICD-10-CM | POA: Diagnosis not present

## 2021-07-27 MED ORDER — DEXCOM G6 RECEIVER DEVI: 9 refills | Status: DC

## 2021-07-27 MED ORDER — DEXCOM G6 SENSOR MISC: 9 refills | Status: DC

## 2021-07-27 NOTE — Progress Notes (Signed)
?This visit occurred during the SARS-CoV-2 public health emergency.  Safety protocols were in place, including screening questions prior to the visit, additional usage of staff PPE, and extensive cleaning of exam room while observing appropriate contact time as indicated for disinfecting solutions. ? ?Subjective:  ?  ? Patient ID: [name] [name] , female    DOB: 04/01/1941 , 81 y.o.   MRN: [id number] ? ? ?No chief complaint on file. ? ? ?HPI ? ?Dgt was called at 1209 and her mother was still in the bed at [place], he found her at 1209pm with jerking, blood sugar then was 53, she gave Gvoke - jerking continued, she was alert during this time but did not speak clearly. Blood sugar went up to 127 after she ate peanut butter and jelly with 2 small bottles of OJ with sugar then went up to 211. Then went up to 227.  She did not go to ER due to daughter not wanting to go but stayed with her until she stopped jerking approximately 230pm.  She has been having "jerking like a hiccup", EMS came out yesterday. By 8 pm she was back to her normal.  Denies confusion, and was nodding.  She has not taken any tresiba since Monday. Daughter reports the patient is scheduled to see [facility] on 08/12/2021.   ? ?She has been having her [place] filled more frequently.  Blood sugar was as low at 53 the 3-4 days out of the last 7 days.   ? ?She brought her medications and her [name] was labeled as Humalog - 200 u/ml kwikpen inj. 3 ml - inject under skin 8-10 units 15 minutes before the 3 main meals.   ?  ? ?Past Medical History:  ?Diagnosis Date  ? Diabetes mellitus ([place])   ? H/O: CVA (cerebrovascular accident) 2002  ? HTN (hypertension)   ? Hyperlipidemia   ? Pulmonary HTN ([place])   ? mild by echo  ?  ? ?Family History  ?Problem Relation Age of Onset  ? Hypertension Mother   ? Hypertension Father   ? Heart Problems Father   ? Hypertension Sister   ? Diabetes Sister   ? Hypertension Brother   ? Diabetes Brother    ? ? ? ?Current Outpatient Medications:  ?  Continuous Blood Gluc Receiver ([place]) DEVI, Use to check blood sugar 3 times a day before meals. Dx code e11.65, Disp: 3 each, Rfl: 9 ?  Continuous Blood Gluc Sensor (DEXCOM G6 SENSOR) MISC, Use to check blood sugar 3 times a day before meals. Dx code e11.65, Disp: 3 each, Rfl: 9 ?  ACCU-CHEK AVIVA PLUS test strip, TEST THREE TIMES DAILY AS DIRECTED, Disp: 100 strip, Rfl: 11 ?  amLODipine (NORVASC) 10 MG tablet, Take 1 tablet (10 mg total) by mouth daily., Disp: 90 tablet, Rfl: 1 ?  aspirin 325 MG EC tablet, Take 1 tablet (325 mg total) by mouth daily., Disp: 90 tablet, Rfl: 1 ?  B-D UF III MINI PEN NEEDLES 31G X 5 MM MISC, Use as directed 4x a day, Disp: 300 each, Rfl: 3 ?  blood glucose meter kit and supplies, Dispense based on patient and insurance preference. Use up to four times daily as directed.dx code e11.65, Disp: 1 each, Rfl: 0 ?  Blood Glucose Monitoring Suppl (TRUE METRIX METER) DEVI, Use to check blood sugars 3 times a day., Disp: 1 each, Rfl: 3 ?  cetirizine (ZYRTEC ALLERGY) 10 MG tablet, Take 1  tablet (10 mg total) by mouth daily., Disp: 90 tablet, Rfl: 1 ?  Cholecalciferol (VITAMIN D3) 50 MCG (2000 UT) TABS, Take 1 tablet by mouth daily at 12 noon., Disp: , Rfl:  ?  Ferrous Sulfate (IRON PO), Take 65 mg by mouth daily at 2 am., Disp: , Rfl:  ?  fluticasone (FLONASE) 50 MCG/ACT nasal spray, Place 2 sprays into both nostrils daily., Disp: 18.2 mL, Rfl: 2 ?  furosemide (LASIX) 20 MG tablet, Take 1 tablet (20 mg total) by mouth as needed., Disp: 90 tablet, Rfl: 2 ?  Glucagon (GVOKE HYPOPEN 2-PACK) 0.5 MG/0.1ML SOAJ, Inject 0.5 mg into the skin daily as needed., Disp: 0.1 mL, Rfl: 3 ?  glucose blood (TRUE METRIX BLOOD GLUCOSE TEST) test strip, Use as instructed to check blood sugar 3 times a day. Dx code e11.65, Disp: 100 each, Rfl: 12 ?  hydrALAZINE (APRESOLINE) 25 MG tablet, Take 1.5 tablets (37.5 mg total) by mouth 2 (two) times daily., Disp:  180 tablet, Rfl: 2 ?  hydrOXYzine (ATARAX/VISTARIL) 25 MG tablet, Take 1 tablet by mouth at bedtime as needed. 1-2 tablets at bedtime prn, Disp: , Rfl:  ?  insulin degludec (TRESIBA FLEXTOUCH) 200 UNIT/ML FlexTouch Pen, Inject 20 Units into the skin daily., Disp: 9 mL, Rfl: 1 ?  insulin lispro (HUMALOG KWIKPEN) 200 UNIT/ML KwikPen, Inject under skin 8-10 units 15 min before the 3 main meals, Disp: 9 mL, Rfl: 1 ?  Lancets (ACCU-CHEK SOFT TOUCH) lancets, Check blood sugars 3 times daily E11.65, Disp: 100 each, Rfl: 12 ?  latanoprost (XALATAN) 0.005 % ophthalmic solution, Place 1 drop into both eyes at bedtime. , Disp: , Rfl:  ?  metoprolol succinate (TOPROL-XL) 25 MG 24 hr tablet, Take 1 tablet (25 mg total) by mouth daily., Disp: 90 tablet, Rfl: 1 ?  Multiple Vitamin (MULTIVITAMIN) tablet, Take 1 tablet by mouth daily., Disp: , Rfl:  ?  rosuvastatin (CRESTOR) 10 MG tablet, Take 1 tablet (10 mg total) by mouth daily., Disp: 90 tablet, Rfl: 1 ?  Semaglutide, 1 MG/DOSE, (OZEMPIC, 1 MG/DOSE,) 2 MG/1.5ML SOPN, Inject 1 mg into the skin once a week., Disp: 4.5 mL, Rfl: 1  ? ?Allergies  ?Allergen Reactions  ? Brimonidine Other (See Comments)  ?  Eyes swell and itch  ? Dorzolamide   ?  Other reaction(s): Other (See Comments) ?Red, inflamed eyelid, dermatitis   ? Timolol Other (See Comments)  ?  Caused eyes to swell and itch  ?  ? ?Review of Systems  ?Constitutional: Negative.   ?Respiratory: Negative.    ?Cardiovascular: Negative.   ?Neurological:  Negative for dizziness and headaches.  ?     Involuntary twitching/jerks  ?Psychiatric/Behavioral: Negative.     ? ?Today's Vitals  ? 07/27/21 1557  ?Pulse: 77  ?Temp: 98.4 ?F (36.9 ?C)  ?TempSrc: Oral  ?Weight: 157 lb (71.2 kg)  ?Height: 5' 2"  (1.575 m)  ? ?Body mass index is 28.72 kg/m?.  ? ?Objective:  ?Physical Exam ?Vitals reviewed.  ?Constitutional:   ?   General: She is not in acute distress. ?   Appearance: Normal appearance.  ?Cardiovascular:  ?   Rate and Rhythm: Normal  rate and regular rhythm.  ?   Pulses: Normal pulses.  ?   Heart sounds: Normal heart sounds. No murmur heard. ?Pulmonary:  ?   Effort: Pulmonary effort is normal. No respiratory distress.  ?   Breath sounds: Normal breath sounds. No wheezing.  ?Neurological:  ?   General: No  focal deficit present.  ?   Mental Status: She is alert and oriented to person, place, and time.  ?   Cranial Nerves: No cranial nerve deficit.  ?   Motor: No weakness.  ?Psychiatric:     ?   Mood and Affect: Mood normal.     ?   Behavior: Behavior normal.     ?   Thought Content: Thought content normal.     ?   Judgment: Judgment normal.  ?  ? ?   ?Assessment And Plan:  ?   ?1. Poorly controlled type 2 diabetes mellitus with circulatory disorder ([place]) ?Comments: Blood sugar 209, gave 8 units of Humalog and she is eating once she gets in the car. She is going to a new endocrinologist Dr Janese Banks in May.   ?- Ambulatory referral to Endocrinology ? ?2. Hypoglycemia ?Comments: She was taking additional [place] labeled Humalog from the pharmacy which was likely causing her low blood sugars. [facility] pharmacist [name] has also spoken with the patient and daughter about changing pharmacies to [facility].  Reviewed medication label which was mislabeled and clarified how she needs to take her medications moving forward.  ?  ? ? ?Patient was given opportunity to ask questions. Patient verbalized understanding of the plan and was able to repeat key elements of the plan. All questions were answered to their satisfaction.  ?[name], FNP  ? ?I, [name], FNP, have reviewed all documentation for this visit. The documentation on 07/27/21 for the exam, diagnosis, procedures, and orders are all accurate and complete. ? ?IF YOU HAVE BEEN REFERRED TO A SPECIALIST, IT MAY TAKE 1-2 WEEKS TO SCHEDULE/PROCESS THE REFERRAL. IF YOU HAVE NOT HEARD FROM US/SPECIALIST IN TWO WEEKS, PLEASE GIVE [place] A CALL AT [phone].  ? ?THE PATIENT IS ENCOURAGED TO  PRACTICE SOCIAL DISTANCING DUE TO THE COVID-19 PANDEMIC.   ?

## 2021-07-27 NOTE — Chronic Care Management (AMB) (Addendum)
? ? ?Chronic Care Management ?Pharmacy Assistant  ? ?Name: [name] [name]  MRN: [id number] DOB: 1940/08/07 ? ?Reason for Encounter: Medication Review/ Coordination of Enhanced Pharmacy Services ?  ?07/27/2020- Patient arrived to office with daughter regarding medication concerns with her [name] [name] and Humalog. Patient brought in a box from [facility] for [place] that had an Humalog prescription label on it. Patient is on both [place] and Humalog but one is long acting insulin and the other is short acting insulin. Patient has been using the Humalog as the short acting insulin which brought her into the office with symptoms of low blood sugars, weakness and elevated blood sugars. Patient blood sugars are very uncontrolled. [name] [name], Pharm D assess patient and reviewed what medications she was taking and how.  ? ?[place] to see why medication was miss-labeled. Per Pharmacy technician she was unusure of what happened and this prescription was from 03/14/2021 which was a partial prescription. Patient has had several refills of Tresiba since then and has another one ready for pick up. ? ?Onboarding patient to [facility] to help with medication adherence. Daughter in agreement she is very concerned about how her mother is taking her medications. Checked insurance plan and [facility] is in network with patient's insurance. Will contact daughter to discuss onboarding. ? ?08/01/2021- Spoke with patient [name], she visited patient and she is doing much better, her blood sugars yesterday were 127 and she is taking her insulin prior to her meals so she is not experience and low blood sugar readings or symptoms. Her blood sugars this morning were in the 100's and daughter has arranged for her niece to stop by patient's home everyday from 8-11, she is a CNA.  ?Reviewed medications I pulled from active med list but questions regarding Ozempic and Humalog and if there were any other medications not  listed. Daughter will call me back with amount left on Ozempic and Humalog, checking with [facility] to see if we can get patient existing pills of Hydralazine 25 mg added to her packs to start next week. Patient has a hard time splitting this medication in half and she is to take 1.5 tablet twice daily and they do not have a pill cutter. Patient will also need a Gvoke pen to keep on hand. ?Daughter mentioned that she went to [facility] to speak with pharmacist regarding the mislabeled prescription, she spoke with [name] [name] who reviewed [facility] camera's to see who worked with patient that day and it was her, she remembered that there were too many labels printed that day but wasn't sure why the Humalog prescription label was placed on the [place] box. Daughter was not happy at all with this conversation and felt like the pharmacist was very confused.  ? ?Sent message to [facility] to see if Hydralazine can be added to new onboard packs, per technician they are not allowed to handle outside medication for safety reason. But they can supply her with a pill cutter.  ? ?08/02/2021- Spoke with [name], she went through patients medications yesterday the following medications are what patient had on hand: ?Hydralazine 25 mg- 3 1/2 bottles left ?Furosemide 20 mg PRN for swelling- 9 pills left ?Amlodipine 10 mg- 2 week left ?Hydroxyzine 25 mg 1-2 nightly for itching PRN- 2 weeks left ?Ozempic 59m/1.5 ml- 1 pen left patient has not started yet ?Metoprolol 25 mg- 2 weeks left ?Rosuvastatin 10 mg- 2 weeks left ?Bayer 325 mg- (OTC) 1 week left ?Zyrtec 10 mg (  OTC)- start with new packs ?Latanoprost 0.005% drops nightly- 1 bottle left ?Humalog 200 u/ml 10units tid- 1 week left ?Tresiba 200 u/ml 20 u daily- patient has 2 pens left ? ?Daughter aware that [facility] will not be able to add existing medication on hand in packs but will supply a pill cutter and they will contact her first regarding delivery  and directions that will start next week. Onboard form updated and sent to [facility] to arrange for first delivery. [name] [name], CPP notified.  ? ?08/03/2021 ?Called Dr Claiborne Billings- Cardiology office to request refill of Furosemide and Hydralazine to be sent to [facility]. [phone] Spoke with staff they will send request for medications to be sent.  ? ?Called Dr. Renne Crigler- Endocrinology office to request refill of Humalog [phone], Left message on CMA voicemail for request.  ? ?Called Dr. Derrel Nip- Dermatology office to request refill of Hydroxyzine 25 mg [phone], spoke with nurse she will request refills to be sent to provider. If patient is eligible for refill medication will be sent.  ? ?Called Dr. Cordelia Pen- Ophthalmology office to request refill of Latanoprost [phone], left message on nurse voicemail for request.  ? ?Patient has an upcoming appointment with New Endocrinologist Dr [name] [name] on 08/12/2021.  ? ?Medications: ?Outpatient Encounter Medications as of 07/27/2021  ?Medication Sig  ? ACCU-CHEK AVIVA PLUS test strip TEST THREE TIMES DAILY AS DIRECTED  ? amLODipine (NORVASC) 10 MG tablet TAKE 1 TABLET BY MOUTH EVERY DAY  ? aspirin 325 MG EC tablet Take 325 mg by mouth daily.  ? B-D UF III MINI PEN NEEDLES 31G X 5 MM MISC Use as directed 4x a day  ? blood glucose meter kit and supplies Dispense based on patient and insurance preference. Use up to four times daily as directed.dx code e11.65  ? Blood Glucose Monitoring Suppl (TRUE METRIX METER) DEVI Use to check blood sugars 3 times a day.  ? cetirizine (ZYRTEC ALLERGY) 10 MG tablet Take 1 tablet (10 mg total) by mouth daily.  ? Cholecalciferol (VITAMIN D3) 50 MCG (2000 UT) TABS Take 1 tablet by mouth daily at 12 noon.  ? Continuous Blood Gluc Receiver (DEXCOM G6 RECEIVER) DEVI Use to check blood sugar 3 times a day before meals. Dx code e11.65  ? Continuous Blood Gluc Sensor (DEXCOM G6 SENSOR) MISC Use to check blood sugar 3 times a  day before meals. Dx code e11.65  ? Ferrous Sulfate (IRON PO) Take 65 mg by mouth daily at 2 am.  ? fluticasone (FLONASE) 50 MCG/ACT nasal spray Place 2 sprays into both nostrils daily.  ? furosemide (LASIX) 20 MG tablet Take 1 tablet (20 mg total) by mouth as needed.  ? Glucagon (GVOKE HYPOPEN 2-PACK) 0.5 MG/0.1ML SOAJ Inject 0.5 mg into the skin daily as needed.  ? glucose blood (TRUE METRIX BLOOD GLUCOSE TEST) test strip Use as instructed to check blood sugar 3 times a day. Dx code e11.65  ? hydrALAZINE (APRESOLINE) 25 MG tablet Take 1.5 tablets (37.5 mg total) by mouth 2 (two) times daily.  ? hydrOXYzine (ATARAX/VISTARIL) 25 MG tablet Take 1 tablet by mouth at bedtime as needed. 1-2 tablets at bedtime prn  ? insulin lispro (HUMALOG KWIKPEN) 200 UNIT/ML KwikPen Inject under skin 8-10 units 15 min before the 3 main meals  ? Lancets (ACCU-CHEK SOFT TOUCH) lancets Check blood sugars 3 times daily E11.65  ? latanoprost (XALATAN) 0.005 % ophthalmic solution Place 1 drop into both eyes at bedtime.   ? metoprolol succinate (  TOPROL-XL) 25 MG 24 hr tablet TAKE 1 TABLET BY MOUTH EVERY DAY  ? Multiple Vitamin (MULTIVITAMIN) tablet Take 1 tablet by mouth daily.  ? rosuvastatin (CRESTOR) 10 MG tablet TAKE 1 TABLET BY MOUTH EVERY DAY  ? Semaglutide, 1 MG/DOSE, (OZEMPIC, 1 MG/DOSE,) 2 MG/1.5ML SOPN Inject 1 mg into the skin once a week.  ? [DISCONTINUED] insulin degludec (TRESIBA FLEXTOUCH) 200 UNIT/ML FlexTouch Pen Inject 24 Units into the skin daily. (Patient taking differently: Inject 24 Units into the skin daily. Pt taking as needed)  ? ?No facility-administered encounter medications on file as of 07/27/2021.  ? ?Pattricia Boss, CMA ?Clinical Pharmacist Assistant ?[phone]  ? ?07/27/2021  ? ?PharmD collaborated with PCP team to assess patients current medication regimen, we discussed in detail the different medications that Ms. Greenfield is taking daily and what they are use for. Provider asked for feedback and we discussed  potential ways to help patient with medication regimen. Insulin labels were placed on the wrong insulin, so patient was taking [place] three times per day. We discussed the difference between the two p

## 2021-07-28 ENCOUNTER — Other Ambulatory Visit: Payer: Self-pay

## 2021-07-28 MED ORDER — TRESIBA FLEXTOUCH 200 UNIT/ML ~~LOC~~ SOPN: 20.0000 [IU] | PEN_INJECTOR | Freq: Every day | SUBCUTANEOUS | 1 refills | Status: DC

## 2021-08-02 ENCOUNTER — Other Ambulatory Visit: Payer: Self-pay

## 2021-08-02 DIAGNOSIS — R0981 Nasal congestion: Secondary | ICD-10-CM

## 2021-08-02 MED ORDER — ROSUVASTATIN CALCIUM 10 MG PO TABS: 10.0000 mg | ORAL_TABLET | Freq: Every day | ORAL | 1 refills | Status: DC

## 2021-08-02 MED ORDER — ASPIRIN 325 MG PO TBEC: 325.0000 mg | DELAYED_RELEASE_TABLET | Freq: Every day | ORAL | 1 refills | Status: DC

## 2021-08-02 MED ORDER — AMLODIPINE BESYLATE 10 MG PO TABS: 10.0000 mg | ORAL_TABLET | Freq: Every day | ORAL | 1 refills | Status: DC

## 2021-08-02 MED ORDER — GVOKE HYPOPEN 2-PACK 0.5 MG/0.1ML ~~LOC~~ SOAJ: 0.5000 mg | Freq: Every day | SUBCUTANEOUS | 3 refills | Status: DC | PRN

## 2021-08-02 MED ORDER — CETIRIZINE HCL 10 MG PO TABS: 10.0000 mg | ORAL_TABLET | Freq: Every day | ORAL | 1 refills | Status: DC

## 2021-08-02 MED ORDER — METOPROLOL SUCCINATE ER 25 MG PO TB24: 25.0000 mg | ORAL_TABLET | Freq: Every day | ORAL | 1 refills | Status: DC

## 2021-08-03 ENCOUNTER — Other Ambulatory Visit: Payer: Self-pay

## 2021-08-03 ENCOUNTER — Telehealth: Payer: Self-pay | Admitting: Cardiovascular Disease

## 2021-08-03 DIAGNOSIS — E1165 Type 2 diabetes mellitus with hyperglycemia: Secondary | ICD-10-CM

## 2021-08-03 MED ORDER — FUROSEMIDE 20 MG PO TABS: 20.0000 mg | ORAL_TABLET | ORAL | 2 refills | Status: DC | PRN

## 2021-08-03 MED ORDER — HUMALOG KWIKPEN 200 UNIT/ML ~~LOC~~ SOPN: PEN_INJECTOR | SUBCUTANEOUS | 1 refills | Status: DC

## 2021-08-03 MED ORDER — HYDRALAZINE HCL 25 MG PO TABS: 37.5000 mg | ORAL_TABLET | Freq: Two times a day (BID) | ORAL | 2 refills | Status: DC

## 2021-08-03 NOTE — Telephone Encounter (Signed)
Refills sent to [facility]. ?

## 2021-08-03 NOTE — Telephone Encounter (Signed)
?*  STAT* If patient is at the pharmacy, call can be transferred to refill team. ? ? ?1. Which medications need to be refilled? (please list name of each medication and dose if known) furosemide (LASIX) 20 MG tablet (Expired); hydrALAZINE (APRESOLINE) 25 MG tablet ? ?2. Which pharmacy/location (including street and city if local pharmacy) is medication to be sent to? As written ? ?3. Do they need a 30 day or 90 day supply? 90; 180  ?

## 2021-08-06 ENCOUNTER — Encounter: Payer: Self-pay | Admitting: Nurse Practitioner

## 2021-08-13 ENCOUNTER — Other Ambulatory Visit: Payer: Self-pay | Admitting: Internal Medicine

## 2021-08-19 ENCOUNTER — Ambulatory Visit: Payer: Medicare Other | Admitting: Internal Medicine

## 2021-08-23 ENCOUNTER — Telehealth: Payer: Self-pay

## 2021-08-23 NOTE — Chronic Care Management (AMB) (Signed)
Called Dr Janese Banks- Endocrinology office to request refill of Ozempic to [facility], spoke with Tia who is placing the request.  Pattricia Boss, [place] Pharmacist Assistant [phone]

## 2021-08-30 ENCOUNTER — Encounter: Payer: Self-pay | Admitting: Nurse Practitioner

## 2021-08-30 ENCOUNTER — Ambulatory Visit (INDEPENDENT_AMBULATORY_CARE_PROVIDER_SITE_OTHER): Payer: Medicare Other | Admitting: Nurse Practitioner

## 2021-08-30 VITALS — BP 120/62 | HR 73 | Temp 98.3°F | Ht 62.0 in | Wt 169.2 lb

## 2021-08-30 DIAGNOSIS — I129 Hypertensive chronic kidney disease with stage 1 through stage 4 chronic kidney disease, or unspecified chronic kidney disease: Secondary | ICD-10-CM | POA: Diagnosis not present

## 2021-08-30 DIAGNOSIS — E1159 Type 2 diabetes mellitus with other circulatory complications: Secondary | ICD-10-CM | POA: Diagnosis not present

## 2021-08-30 DIAGNOSIS — Z6829 Body mass index (BMI) 29.0-29.9, adult: Secondary | ICD-10-CM

## 2021-08-30 DIAGNOSIS — E7800 Pure hypercholesterolemia, unspecified: Secondary | ICD-10-CM

## 2021-08-30 DIAGNOSIS — E669 Obesity, unspecified: Secondary | ICD-10-CM

## 2021-08-30 DIAGNOSIS — E1165 Type 2 diabetes mellitus with hyperglycemia: Secondary | ICD-10-CM

## 2021-08-30 DIAGNOSIS — R42 Dizziness and giddiness: Secondary | ICD-10-CM

## 2021-08-30 DIAGNOSIS — R7989 Other specified abnormal findings of blood chemistry: Secondary | ICD-10-CM | POA: Diagnosis not present

## 2021-08-30 DIAGNOSIS — M7989 Other specified soft tissue disorders: Secondary | ICD-10-CM

## 2021-08-30 DIAGNOSIS — E162 Hypoglycemia, unspecified: Secondary | ICD-10-CM

## 2021-08-30 NOTE — Progress Notes (Signed)
I,Victoria T Hamilton,acting as a Education administrator for [name], FNP.,have documented all relevant documentation on the behalf of [name], FNP,as directed by  [name], FNP while in the presence of [name], [place].   This visit occurred during the SARS-CoV-2 public health emergency.  Safety protocols were in place, including screening questions prior to the visit, additional usage of staff PPE, and extensive cleaning of exam room while observing appropriate contact time as indicated for disinfecting solutions.  Subjective:     Patient ID: [name] [name] , female    DOB: December 16, 1940 , 81 y.o.   MRN: [id number]   Chief Complaint  Patient presents with   Edema    HPI  Pt is here today for swelling started over the weekend. Right foot & left hand. Initially noticed yesterday, she is here today with her granddaughter Andee Poles).  When she woke up on Monday. Her left hand is also swollen since yesterday and feet have been swollen since Sunday. Small improvement overnight. She ate green beans and hamburger.   She had grilled chicken and avocado and broccoli and cheese soup. Chicken, green beans and ate broccoli soup. She does not season with salt. Her blood sugar was 115 before having juice. She is unable to get her compression socks on.   Her daughter is on the phone.  She has been taking her furosemide daily but has not been effective.     Past Medical History:  Diagnosis Date   Diabetes mellitus ([place])    H/O: CVA (cerebrovascular accident) 2002   HTN (hypertension)    Hyperlipidemia    Pulmonary HTN ([facility])    mild by echo     Family History  Problem Relation Age of Onset   Hypertension Mother    Hypertension Father    Heart Problems Father    Hypertension Sister    Diabetes Sister    Hypertension Brother    Diabetes Brother      Current Outpatient Medications:    ACCU-CHEK AVIVA PLUS test strip, TEST THREE TIMES DAILY AS DIRECTED, Disp: 100 strip, Rfl: 11    amLODipine (NORVASC) 10 MG tablet, Take 1 tablet (10 mg total) by mouth daily., Disp: 90 tablet, Rfl: 1   aspirin 325 MG EC tablet, Take 1 tablet (325 mg total) by mouth daily., Disp: 90 tablet, Rfl: 1   B-D UF III MINI PEN NEEDLES 31G X 5 MM MISC, Use as directed 4x a day, Disp: 300 each, Rfl: 3   blood glucose meter kit and supplies, Dispense based on patient and insurance preference. Use up to four times daily as directed.dx code e11.65, Disp: 1 each, Rfl: 0   Blood Glucose Monitoring Suppl (TRUE METRIX METER) DEVI, Use to check blood sugars 3 times a day., Disp: 1 each, Rfl: 3   cetirizine (ZYRTEC ALLERGY) 10 MG tablet, Take 1 tablet (10 mg total) by mouth daily., Disp: 90 tablet, Rfl: 1   Cholecalciferol (VITAMIN D3) 50 MCG (2000 UT) TABS, Take 1 tablet by mouth daily at 12 noon., Disp: , Rfl:    Continuous Blood Gluc Receiver ([place]) DEVI, Use to check blood sugar 3 times a day before meals. Dx code e11.65, Disp: 3 each, Rfl: 9   Continuous Blood Gluc Sensor (DEXCOM G6 SENSOR) MISC, Use to check blood sugar 3 times a day before meals. Dx code e11.65, Disp: 3 each, Rfl: 9   Ferrous Sulfate (IRON PO), Take 65 mg by mouth daily at 2 am., Disp: ,  Rfl:    fluticasone (FLONASE) 50 MCG/ACT nasal spray, Place 2 sprays into both nostrils daily., Disp: 18.2 mL, Rfl: 2   Glucagon (GVOKE HYPOPEN 2-PACK) 0.5 MG/0.1ML SOAJ, Inject 0.5 mg into the skin daily as needed., Disp: 0.1 mL, Rfl: 3   glucose blood (TRUE METRIX BLOOD GLUCOSE TEST) test strip, Use as instructed to check blood sugar 3 times a day. Dx code e11.65, Disp: 100 each, Rfl: 12   hydrALAZINE (APRESOLINE) 25 MG tablet, Take 1.5 tablets (37.5 mg total) by mouth 2 (two) times daily., Disp: 180 tablet, Rfl: 2   insulin degludec (TRESIBA FLEXTOUCH) 200 UNIT/ML FlexTouch Pen, Inject 20 Units into the skin daily., Disp: 9 mL, Rfl: 1   insulin lispro (HUMALOG KWIKPEN) 200 UNIT/ML KwikPen, Inject under skin 8-10 units 15 min before the 3  main meals, Disp: 9 mL, Rfl: 1   Lancets (ACCU-CHEK SOFT TOUCH) lancets, Check blood sugars 3 times daily E11.65, Disp: 100 each, Rfl: 12   latanoprost (XALATAN) 0.005 % ophthalmic solution, Place 1 drop into both eyes at bedtime. , Disp: , Rfl:    metoprolol succinate (TOPROL-XL) 25 MG 24 hr tablet, Take 1 tablet (25 mg total) by mouth daily., Disp: 90 tablet, Rfl: 1   Multiple Vitamin (MULTIVITAMIN) tablet, Take 1 tablet by mouth daily., Disp: , Rfl:    rosuvastatin (CRESTOR) 10 MG tablet, TAKE 1 TABLET BY MOUTH EVERY DAY, Disp: 90 tablet, Rfl: 1   Semaglutide, 1 MG/DOSE, (OZEMPIC, 1 MG/DOSE,) 2 MG/1.5ML SOPN, Inject 1 mg into the skin once a week., Disp: 4.5 mL, Rfl: 1   furosemide (LASIX) 20 MG tablet, Take 1 tablet (20 mg total) by mouth every other day., Disp: , Rfl:    hydrOXYzine (ATARAX) 25 MG tablet, Take 1 tablet (25 mg total) by mouth 3 (three) times daily as needed for itching., Disp: 30 tablet, Rfl: 0   Allergies  Allergen Reactions   Brimonidine Other (See Comments)    Eyes swell and itch   Dorzolamide     Other reaction(s): Other (See Comments) Red, inflamed eyelid, dermatitis    Timolol Other (See Comments)    Caused eyes to swell and itch     Review of Systems  Constitutional: Negative.   Respiratory: Negative.    Cardiovascular: Negative.   Neurological: Negative.   Psychiatric/Behavioral: Negative.      Today's Vitals   08/30/21 1059  BP: 120/62  Pulse: 73  Temp: 98.3 F (36.8 C)  Weight: 169 lb 3.2 oz (76.7 kg)  Height: 5' 2"  (1.575 m)  PainSc: 0-No pain   Body mass index is 30.95 kg/m.  Wt Readings from Last 3 Encounters:  09/04/21 159 lb 9.8 oz (72.4 kg)  08/30/21 169 lb 3.2 oz (76.7 kg)  07/27/21 157 lb (71.2 kg)    Objective:  Physical Exam Vitals reviewed.  Constitutional:      General: She is not in acute distress.    Appearance: Normal appearance.  Cardiovascular:     Rate and Rhythm: Normal rate and regular rhythm.     Pulses: Normal  pulses.     Heart sounds: Normal heart sounds. No murmur heard. Pulmonary:     Effort: Pulmonary effort is normal. No respiratory distress.     Breath sounds: Normal breath sounds. No wheezing.  Musculoskeletal:        General: Swelling (left hand swelling) present.     Right lower leg: Edema (non pitting) present.     Left lower leg: Edema present.  Neurological:     General: No focal deficit present.     Mental Status: She is alert and oriented to person, place, and time.     Cranial Nerves: No cranial nerve deficit.     Motor: No weakness.  Psychiatric:        Mood and Affect: Mood normal.        Behavior: Behavior normal.        Thought Content: Thought content normal.        Judgment: Judgment normal.        Assessment And Plan:     1. Leg swelling Comments: She is to take an extra furosemide today, tomorrow and Thursday. then call us on Thursday to let me know how she is doing. Encouraged to avoid high salt foods  2. Elevated brain natriuretic peptide (BNP) level Comments: Had elevated BNP at ER previously, will recheck she is taking furosemide - Brain natriuretic peptide  3. Poorly controlled type 2 diabetes mellitus with circulatory disorder [facility]) Comments: Continue follow up with Endocrinology  4. Hypertensive nephropathy Comments: Blood pressure is well controlled, continue current medications and follow up with Cardiology - CMP14+EGFR  5. Pure hypercholesterolemia Comments: Stable, continue statin - Lipid panel - CMP14+EGFR  6. Vertigo Comments: Stable  7. BMI 29.0-29.9,adult  She is encouraged to strive for BMI less than 30 to decrease cardiac risk. Advised to aim for at least 150 minutes of exercise per week.    Patient was given opportunity to ask questions. Patient verbalized understanding of the plan and was able to repeat key elements of the plan. All questions were answered to their satisfaction.  [name], FNP   I, [name], FNP, have  reviewed all documentation for this visit. The documentation on 08/30/21 for the exam, diagnosis, procedures, and orders are all accurate and complete.   IF YOU HAVE BEEN REFERRED TO A SPECIALIST, IT MAY TAKE 1-2 WEEKS TO SCHEDULE/PROCESS THE REFERRAL. IF YOU HAVE NOT HEARD FROM US/SPECIALIST IN TWO WEEKS, PLEASE GIVE [place] A CALL AT [phone].   THE PATIENT IS ENCOURAGED TO PRACTICE SOCIAL DISTANCING DUE TO THE COVID-19 PANDEMIC.

## 2021-08-30 NOTE — Patient Instructions (Signed)
Edema  Edema is an abnormal buildup of fluids in the body tissues and under the skin. Swelling of the legs, feet, and ankles is a common symptom that becomes more likely as you get older. Swelling is also common in looser tissues, such as around the eyes. Pressing on the area may make a temporary dent in your skin (pitting edema). This fluid may also accumulate in your lungs (pulmonary edema). There are many possible causes of edema. Eating too much salt (sodium) and being on your feet or sitting for a long time can cause edema in your legs, feet, and ankles. Common causes of edema include: Certain medical conditions, such as heart failure, liver or kidney disease, and cancer. Weak leg blood vessels. An injury. Pregnancy. Medicines. Being obese. Low protein levels in the blood. Hot weather may make edema worse. Edema is usually painless. Your skin may look swollen or shiny. Follow these instructions at home: Medicines Take over-the-counter and prescription medicines only as told by your health care provider. Your health care provider may prescribe a medicine to help your body get rid of extra water (diuretic). Take this medicine if you are told to take it. Eating and drinking Eat a low-salt (low-sodium) diet to reduce fluid as told by your health care provider. Sometimes, eating less salt may reduce swelling. Depending on the cause of your swelling, you may need to limit how much fluid you drink (fluid restriction). General instructions Raise (elevate) the injured area above the level of your heart while you are sitting or lying down. Do not sit still or stand for long periods of time. Do not wear tight clothing. Do not wear garters on your upper legs. Exercise your legs to get your circulation going. This helps to move the fluid back into your blood vessels, and it may help the swelling go down. Wear compression stockings as told by your health care provider. These stockings help to prevent  blood clots and reduce swelling in your legs. It is important that these are the correct size. These stockings should be prescribed by your health care provider to prevent possible injuries. If elastic bandages or wraps are recommended, use them as told by your health care provider. Contact a health care provider if: Your edema does not get better with treatment. You have heart, liver, or kidney disease and have symptoms of edema. You have sudden and unexplained weight gain. Get help right away if: You develop shortness of breath or chest pain. You cannot breathe when you lie down. You develop pain, redness, or warmth in the swollen areas. You have heart, liver, or kidney disease and suddenly get edema. You have a fever and your symptoms suddenly get worse. These symptoms may be an emergency. Get help right away. Call 911. Do not wait to see if the symptoms will go away. Do not drive yourself to the hospital. Summary Edema is an abnormal buildup of fluids in the body tissues and under the skin. Eating too much salt (sodium)and being on your feet or sitting for a long time can cause edema in your legs, feet, and ankles. Raise (elevate) the injured area above the level of your heart while you are sitting or lying down. Follow your health care provider's instructions about diet and how much fluid you can drink. This information is not intended to replace advice given to you by your health care provider. Make sure you discuss any questions you have with your health care provider. Document Revised: 11/29/2020 Document   Reviewed: 11/29/2020 [employer] Patient Education  2023 [employer]  

## 2021-08-31 LAB — LIPID PANEL
Chol/HDL Ratio: 4.2 ratio (ref 0.0–4.4)
Cholesterol, Total: 166 mg/dL (ref 100–199)
HDL: 40 mg/dL (ref >39)
LDL Chol Calc (NIH): 101 mg/dL — ABNORMAL HIGH (ref 0–99)
Triglycerides: 143 mg/dL (ref 0–149)
VLDL Cholesterol Cal: 25 mg/dL (ref 5–40)

## 2021-08-31 LAB — CMP14+EGFR
ALT: 11 IU/L (ref 0–32)
AST: 14 IU/L (ref 0–40)
Albumin/Globulin Ratio: 1.3 (ref 1.2–2.2)
Albumin: 3.6 g/dL (ref 3.6–4.6)
Alkaline Phosphatase: 101 IU/L (ref 44–121)
BUN/Creatinine Ratio: 16 (ref 12–28)
BUN: 41 mg/dL — ABNORMAL HIGH (ref 8–27)
Bilirubin Total: 0.3 mg/dL (ref 0.0–1.2)
CO2: 24 mmol/L (ref 20–29)
Calcium: 8.9 mg/dL (ref 8.7–10.3)
Chloride: 106 mmol/L (ref 96–106)
Creatinine, Ser: 2.58 mg/dL — ABNORMAL HIGH (ref 0.57–1.00)
Globulin, Total: 2.7 g/dL (ref 1.5–4.5)
Glucose: 121 mg/dL — ABNORMAL HIGH (ref 70–99)
Potassium: 4.7 mmol/L (ref 3.5–5.2)
Sodium: 142 mmol/L (ref 134–144)
Total Protein: 6.3 g/dL (ref 6.0–8.5)
eGFR: 18 mL/min/{1.73_m2} — ABNORMAL LOW (ref >59)

## 2021-08-31 LAB — BRAIN NATRIURETIC PEPTIDE: BNP: 316.4 pg/mL — ABNORMAL HIGH (ref 0.0–100.0)

## 2021-09-01 ENCOUNTER — Encounter: Payer: Self-pay | Admitting: Cardiovascular Disease

## 2021-09-01 ENCOUNTER — Encounter: Payer: Self-pay | Admitting: Nurse Practitioner

## 2021-09-01 ENCOUNTER — Other Ambulatory Visit: Payer: Self-pay | Admitting: Nurse Practitioner

## 2021-09-01 MED ORDER — FUROSEMIDE 20 MG PO TABS: 20.0000 mg | ORAL_TABLET | ORAL | 2 refills | Status: DC | PRN

## 2021-09-02 ENCOUNTER — Emergency Department (HOSPITAL_COMMUNITY): Payer: Medicare Other

## 2021-09-02 ENCOUNTER — Inpatient Hospital Stay (HOSPITAL_COMMUNITY)
Admission: EM | Admit: 2021-09-02 | Discharge: 2021-09-04 | DRG: 291 | Disposition: A | Discharge status: Home / Self Care | Payer: Medicare Other | Attending: Family Medicine | Admitting: Family Medicine

## 2021-09-02 ENCOUNTER — Encounter (HOSPITAL_COMMUNITY): Payer: Self-pay

## 2021-09-02 ENCOUNTER — Other Ambulatory Visit: Payer: Self-pay

## 2021-09-02 DIAGNOSIS — E1122 Type 2 diabetes mellitus with diabetic chronic kidney disease: Secondary | ICD-10-CM | POA: Diagnosis present

## 2021-09-02 DIAGNOSIS — Z794 Long term (current) use of insulin: Secondary | ICD-10-CM

## 2021-09-02 DIAGNOSIS — D72829 Elevated white blood cell count, unspecified: Secondary | ICD-10-CM | POA: Diagnosis present

## 2021-09-02 DIAGNOSIS — Z833 Family history of diabetes mellitus: Secondary | ICD-10-CM | POA: Diagnosis not present

## 2021-09-02 DIAGNOSIS — I509 Heart failure, unspecified: Secondary | ICD-10-CM

## 2021-09-02 DIAGNOSIS — I5031 Acute diastolic (congestive) heart failure: Secondary | ICD-10-CM | POA: Diagnosis not present

## 2021-09-02 DIAGNOSIS — I160 Hypertensive urgency: Secondary | ICD-10-CM | POA: Diagnosis present

## 2021-09-02 DIAGNOSIS — E1142 Type 2 diabetes mellitus with diabetic polyneuropathy: Secondary | ICD-10-CM | POA: Diagnosis present

## 2021-09-02 DIAGNOSIS — Z8673 Personal history of transient ischemic attack (TIA), and cerebral infarction without residual deficits: Secondary | ICD-10-CM

## 2021-09-02 DIAGNOSIS — I129 Hypertensive chronic kidney disease with stage 1 through stage 4 chronic kidney disease, or unspecified chronic kidney disease: Secondary | ICD-10-CM | POA: Diagnosis present

## 2021-09-02 DIAGNOSIS — Z79899 Other long term (current) drug therapy: Secondary | ICD-10-CM

## 2021-09-02 DIAGNOSIS — I5033 Acute on chronic diastolic (congestive) heart failure: Secondary | ICD-10-CM | POA: Diagnosis present

## 2021-09-02 DIAGNOSIS — E782 Mixed hyperlipidemia: Secondary | ICD-10-CM | POA: Diagnosis present

## 2021-09-02 DIAGNOSIS — Z8249 Family history of ischemic heart disease and other diseases of the circulatory system: Secondary | ICD-10-CM | POA: Diagnosis not present

## 2021-09-02 DIAGNOSIS — I130 Hypertensive heart and chronic kidney disease with heart failure and stage 1 through stage 4 chronic kidney disease, or unspecified chronic kidney disease: Principal | ICD-10-CM | POA: Diagnosis present

## 2021-09-02 DIAGNOSIS — Z20822 Contact with and (suspected) exposure to covid-19: Secondary | ICD-10-CM | POA: Diagnosis present

## 2021-09-02 DIAGNOSIS — E1169 Type 2 diabetes mellitus with other specified complication: Secondary | ICD-10-CM | POA: Diagnosis not present

## 2021-09-02 DIAGNOSIS — Z7982 Long term (current) use of aspirin: Secondary | ICD-10-CM | POA: Diagnosis not present

## 2021-09-02 DIAGNOSIS — E1165 Type 2 diabetes mellitus with hyperglycemia: Secondary | ICD-10-CM | POA: Diagnosis present

## 2021-09-02 DIAGNOSIS — D631 Anemia in chronic kidney disease: Secondary | ICD-10-CM

## 2021-09-02 DIAGNOSIS — N184 Chronic kidney disease, stage 4 (severe): Secondary | ICD-10-CM | POA: Diagnosis present

## 2021-09-02 DIAGNOSIS — I1310 Hypertensive heart and chronic kidney disease without heart failure, with stage 1 through stage 4 chronic kidney disease, or unspecified chronic kidney disease: Secondary | ICD-10-CM | POA: Diagnosis present

## 2021-09-02 DIAGNOSIS — I5032 Chronic diastolic (congestive) heart failure: Secondary | ICD-10-CM

## 2021-09-02 DIAGNOSIS — E119 Type 2 diabetes mellitus without complications: Secondary | ICD-10-CM | POA: Diagnosis present

## 2021-09-02 DIAGNOSIS — Z888 Allergy status to other drugs, medicaments and biological substances status: Secondary | ICD-10-CM | POA: Diagnosis not present

## 2021-09-02 DIAGNOSIS — E785 Hyperlipidemia, unspecified: Secondary | ICD-10-CM | POA: Diagnosis present

## 2021-09-02 DIAGNOSIS — I10 Essential (primary) hypertension: Secondary | ICD-10-CM | POA: Diagnosis present

## 2021-09-02 DIAGNOSIS — R0602 Shortness of breath: Secondary | ICD-10-CM

## 2021-09-02 DIAGNOSIS — E1159 Type 2 diabetes mellitus with other circulatory complications: Secondary | ICD-10-CM | POA: Diagnosis not present

## 2021-09-02 DIAGNOSIS — I5189 Other ill-defined heart diseases: Secondary | ICD-10-CM

## 2021-09-02 DIAGNOSIS — J9601 Acute respiratory failure with hypoxia: Secondary | ICD-10-CM | POA: Diagnosis present

## 2021-09-02 HISTORY — DX: Chronic diastolic (congestive) heart failure: I50.32

## 2021-09-02 HISTORY — DX: Hypertensive urgency: I16.0

## 2021-09-02 LAB — CBC WITH DIFFERENTIAL/PLATELET
Abs Immature Granulocytes: 0.07 10*3/uL (ref 0.00–0.07)
Basophils Absolute: 0.1 10*3/uL (ref 0.0–0.1)
Basophils Relative: 0 %
Eosinophils Absolute: 0.2 10*3/uL (ref 0.0–0.5)
Eosinophils Relative: 1 %
HCT: 31 % — ABNORMAL LOW (ref 36.0–46.0)
Hemoglobin: 10.2 g/dL — ABNORMAL LOW (ref 12.0–15.0)
Immature Granulocytes: 0 %
Lymphocytes Relative: 5 %
Lymphs Abs: 0.8 10*3/uL (ref 0.7–4.0)
MCH: 30 pg (ref 26.0–34.0)
MCHC: 32.9 g/dL (ref 30.0–36.0)
MCV: 91.2 fL (ref 80.0–100.0)
Monocytes Absolute: 1.1 10*3/uL — ABNORMAL HIGH (ref 0.1–1.0)
Monocytes Relative: 7 %
Neutro Abs: 14 10*3/uL — ABNORMAL HIGH (ref 1.7–7.7)
Neutrophils Relative %: 87 %
Platelets: 367 10*3/uL (ref 150–400)
RBC: 3.4 MIL/uL — ABNORMAL LOW (ref 3.87–5.11)
RDW: 14.6 % (ref 11.5–15.5)
WBC: 16.1 10*3/uL — ABNORMAL HIGH (ref 4.0–10.5)
nRBC: 0 % (ref 0.0–0.2)

## 2021-09-02 LAB — BRAIN NATRIURETIC PEPTIDE: B Natriuretic Peptide: 334 pg/mL — ABNORMAL HIGH (ref 0.0–100.0)

## 2021-09-02 LAB — TROPONIN I (HIGH SENSITIVITY): Troponin I (High Sensitivity): 22 ng/L — ABNORMAL HIGH (ref <18)

## 2021-09-02 LAB — COMPREHENSIVE METABOLIC PANEL
ALT: 13 U/L (ref 0–44)
AST: 19 U/L (ref 15–41)
Albumin: 3.4 g/dL — ABNORMAL LOW (ref 3.5–5.0)
Alkaline Phosphatase: 87 U/L (ref 38–126)
Anion gap: 9 (ref 5–15)
BUN: 44 mg/dL — ABNORMAL HIGH (ref 8–23)
CO2: 23 mmol/L (ref 22–32)
Calcium: 8.9 mg/dL (ref 8.9–10.3)
Chloride: 109 mmol/L (ref 98–111)
Creatinine, Ser: 2.77 mg/dL — ABNORMAL HIGH (ref 0.44–1.00)
GFR, Estimated: 16 mL/min — ABNORMAL LOW (ref >60)
Glucose, Bld: 254 mg/dL — ABNORMAL HIGH (ref 70–99)
Potassium: 4.1 mmol/L (ref 3.5–5.1)
Sodium: 141 mmol/L (ref 135–145)
Total Bilirubin: 0.4 mg/dL (ref 0.3–1.2)
Total Protein: 7.1 g/dL (ref 6.5–8.1)

## 2021-09-02 LAB — GLUCOSE, CAPILLARY
Glucose-Capillary: 259 mg/dL — ABNORMAL HIGH (ref 70–99)
Glucose-Capillary: 208 mg/dL — ABNORMAL HIGH (ref 70–99)
Glucose-Capillary: 203 mg/dL — ABNORMAL HIGH (ref 70–99)
Glucose-Capillary: 185 mg/dL — ABNORMAL HIGH (ref 70–99)
Glucose-Capillary: 81 mg/dL (ref 70–99)

## 2021-09-02 LAB — SARS CORONAVIRUS 2 BY RT PCR: SARS Coronavirus 2 by RT PCR: NEGATIVE

## 2021-09-02 LAB — HEMOGLOBIN A1C
Hgb A1c MFr Bld: 8.4 % — ABNORMAL HIGH (ref 4.8–5.6)
Mean Plasma Glucose: 194.38 mg/dL

## 2021-09-02 LAB — CBG MONITORING, ED: Glucose-Capillary: 258 mg/dL — ABNORMAL HIGH (ref 70–99)

## 2021-09-02 MED ORDER — INSULIN GLARGINE-YFGN 100 UNIT/ML ~~LOC~~ SOLN
18.0000 [IU] | Freq: Every day | SUBCUTANEOUS | Status: DC
  Administered 2021-09-02: 18 [IU] via SUBCUTANEOUS
  Filled 2021-09-02 (×5): qty 0.18

## 2021-09-02 MED ORDER — HYDRALAZINE HCL 25 MG PO TABS
37.5000 mg | ORAL_TABLET | Freq: Three times a day (TID) | ORAL | Status: DC
Start: 2021-09-02 — End: 2021-09-04
  Administered 2021-09-02 – 2021-09-04 (×7): 37.5 mg via ORAL
  Filled 2021-09-02 (×7): qty 2

## 2021-09-02 MED ORDER — ADULT MULTIVITAMIN W/MINERALS CH
1.0000 | ORAL_TABLET | Freq: Every day | ORAL | Status: DC
  Administered 2021-09-02 – 2021-09-04 (×3): 1 via ORAL
  Filled 2021-09-02 (×3): qty 1

## 2021-09-02 MED ORDER — ASPIRIN 325 MG PO TBEC
325.0000 mg | DELAYED_RELEASE_TABLET | Freq: Every day | ORAL | Status: DC
Start: 2021-09-03 — End: 2021-09-04
  Administered 2021-09-03 – 2021-09-04 (×2): 325 mg via ORAL
  Filled 2021-09-02 (×2): qty 1

## 2021-09-02 MED ORDER — INSULIN ASPART 100 UNIT/ML IJ SOLN: 5.0000 [IU] | Freq: Three times a day (TID) | INTRAMUSCULAR | Status: DC

## 2021-09-02 MED ORDER — FUROSEMIDE 10 MG/ML IJ SOLN
30.0000 mg | Freq: Once | INTRAMUSCULAR | Status: AC
  Administered 2021-09-02: 30 mg via INTRAVENOUS
  Filled 2021-09-02: qty 4

## 2021-09-02 MED ORDER — INSULIN GLARGINE-YFGN 100 UNIT/ML ~~LOC~~ SOLN
15.0000 [IU] | Freq: Every day | SUBCUTANEOUS | Status: DC
  Filled 2021-09-02 (×2): qty 0.15

## 2021-09-02 MED ORDER — FUROSEMIDE 10 MG/ML IJ SOLN
40.0000 mg | Freq: Once | INTRAMUSCULAR | Status: AC
  Administered 2021-09-02: 40 mg via INTRAVENOUS
  Filled 2021-09-02: qty 4

## 2021-09-02 MED ORDER — HYDRALAZINE HCL 25 MG PO TABS
37.5000 mg | ORAL_TABLET | Freq: Two times a day (BID) | ORAL | Status: DC
  Administered 2021-09-02: 37.5 mg via ORAL
  Filled 2021-09-02: qty 2

## 2021-09-02 MED ORDER — ACETAMINOPHEN 325 MG PO TABS
650.0000 mg | ORAL_TABLET | Freq: Four times a day (QID) | ORAL | Status: DC | PRN
  Administered 2021-09-02: 650 mg via ORAL
  Filled 2021-09-02: qty 2

## 2021-09-02 MED ORDER — FUROSEMIDE 10 MG/ML IJ SOLN
40.0000 mg | Freq: Two times a day (BID) | INTRAMUSCULAR | Status: DC
  Administered 2021-09-02 – 2021-09-04 (×4): 40 mg via INTRAVENOUS
  Filled 2021-09-02 (×5): qty 4

## 2021-09-02 MED ORDER — AMLODIPINE BESYLATE 10 MG PO TABS
10.0000 mg | ORAL_TABLET | Freq: Every day | ORAL | Status: DC
  Administered 2021-09-02 – 2021-09-04 (×3): 10 mg via ORAL
  Filled 2021-09-02: qty 2
  Filled 2021-09-02 (×2): qty 1

## 2021-09-02 MED ORDER — FLUTICASONE PROPIONATE 50 MCG/ACT NA SUSP
2.0000 | Freq: Every day | NASAL | Status: DC
  Administered 2021-09-03 – 2021-09-04 (×2): 2 via NASAL
  Filled 2021-09-02: qty 16

## 2021-09-02 MED ORDER — ONDANSETRON HCL 4 MG PO TABS: 4.0000 mg | ORAL_TABLET | Freq: Four times a day (QID) | ORAL | Status: DC | PRN

## 2021-09-02 MED ORDER — ACETAMINOPHEN 650 MG RE SUPP
650.0000 mg | Freq: Four times a day (QID) | RECTAL | Status: DC | PRN
Start: 2021-09-02 — End: 2021-09-04

## 2021-09-02 MED ORDER — INSULIN ASPART 100 UNIT/ML IJ SOLN
6.0000 [IU] | Freq: Three times a day (TID) | INTRAMUSCULAR | Status: DC
  Administered 2021-09-02 – 2021-09-04 (×6): 6 [IU] via SUBCUTANEOUS

## 2021-09-02 MED ORDER — FERROUS SULFATE 325 (65 FE) MG PO TABS
325.0000 mg | ORAL_TABLET | Freq: Every day | ORAL | Status: DC
  Administered 2021-09-03 – 2021-09-04 (×2): 325 mg via ORAL
  Filled 2021-09-02 (×2): qty 1

## 2021-09-02 MED ORDER — INSULIN ASPART 100 UNIT/ML IJ SOLN: 3.0000 [IU] | Freq: Three times a day (TID) | INTRAMUSCULAR | Status: DC

## 2021-09-02 MED ORDER — INSULIN ASPART 100 UNIT/ML IJ SOLN: 0.0000 [IU] | Freq: Every day | INTRAMUSCULAR | Status: DC

## 2021-09-02 MED ORDER — OXYCODONE HCL 5 MG PO TABS: 5.0000 mg | ORAL_TABLET | Freq: Four times a day (QID) | ORAL | Status: DC | PRN

## 2021-09-02 MED ORDER — LORATADINE 10 MG PO TABS
10.0000 mg | ORAL_TABLET | Freq: Every day | ORAL | Status: DC
  Administered 2021-09-02 – 2021-09-04 (×3): 10 mg via ORAL
  Filled 2021-09-02 (×3): qty 1

## 2021-09-02 MED ORDER — BISACODYL 5 MG PO TBEC: 5.0000 mg | DELAYED_RELEASE_TABLET | Freq: Every day | ORAL | Status: DC | PRN

## 2021-09-02 MED ORDER — HYDRALAZINE HCL 20 MG/ML IJ SOLN
10.0000 mg | INTRAMUSCULAR | Status: DC | PRN
Start: 2021-09-02 — End: 2021-09-04
  Administered 2021-09-03 – 2021-09-04 (×3): 10 mg via INTRAVENOUS
  Filled 2021-09-02 (×3): qty 1

## 2021-09-02 MED ORDER — LATANOPROST 0.005 % OP SOLN
1.0000 [drp] | Freq: Every day | OPHTHALMIC | Status: DC
  Administered 2021-09-02 – 2021-09-03 (×2): 1 [drp] via OPHTHALMIC
  Filled 2021-09-02: qty 2.5

## 2021-09-02 MED ORDER — METOPROLOL SUCCINATE ER 25 MG PO TB24
25.0000 mg | ORAL_TABLET | Freq: Every day | ORAL | Status: DC
  Administered 2021-09-02 – 2021-09-04 (×3): 25 mg via ORAL
  Filled 2021-09-02 (×3): qty 1

## 2021-09-02 MED ORDER — ONDANSETRON HCL 4 MG/2ML IJ SOLN: 4.0000 mg | Freq: Four times a day (QID) | INTRAMUSCULAR | Status: DC | PRN

## 2021-09-02 MED ORDER — HYDRALAZINE HCL 50 MG PO TABS: 50.0000 mg | ORAL_TABLET | Freq: Three times a day (TID) | ORAL | Status: DC

## 2021-09-02 MED ORDER — NITROGLYCERIN 2 % TD OINT
1.0000 [in_us] | TOPICAL_OINTMENT | Freq: Once | TRANSDERMAL | Status: AC
Start: 2021-09-02 — End: 2021-09-02
  Administered 2021-09-02: 1 [in_us] via TOPICAL
  Filled 2021-09-02: qty 1

## 2021-09-02 MED ORDER — INSULIN ASPART 100 UNIT/ML IJ SOLN
0.0000 [IU] | Freq: Three times a day (TID) | INTRAMUSCULAR | Status: DC
  Administered 2021-09-02: 5 [IU] via SUBCUTANEOUS
  Administered 2021-09-02: 3 [IU] via SUBCUTANEOUS
  Administered 2021-09-02 – 2021-09-04 (×4): 2 [IU] via SUBCUTANEOUS

## 2021-09-02 MED ORDER — FENTANYL CITRATE PF 50 MCG/ML IJ SOSY: 12.5000 ug | PREFILLED_SYRINGE | INTRAMUSCULAR | Status: DC | PRN

## 2021-09-02 MED ORDER — HEPARIN SODIUM (PORCINE) 5000 UNIT/ML IJ SOLN
5000.0000 [IU] | Freq: Three times a day (TID) | INTRAMUSCULAR | Status: DC
  Administered 2021-09-02 – 2021-09-04 (×6): 5000 [IU] via SUBCUTANEOUS
  Filled 2021-09-02 (×6): qty 1

## 2021-09-02 MED ORDER — ROSUVASTATIN CALCIUM 5 MG PO TABS
10.0000 mg | ORAL_TABLET | Freq: Every evening | ORAL | Status: DC
Start: 2021-09-02 — End: 2021-09-04
  Administered 2021-09-02 – 2021-09-03 (×2): 10 mg via ORAL
  Filled 2021-09-02 (×2): qty 2

## 2021-09-02 MED ORDER — HYDRALAZINE HCL 20 MG/ML IJ SOLN
10.0000 mg | Freq: Once | INTRAMUSCULAR | Status: AC
  Administered 2021-09-02: 10 mg via INTRAVENOUS
  Filled 2021-09-02: qty 1

## 2021-09-02 NOTE — ED Triage Notes (Signed)
EMS called out for shortness of breath. Per EMS pt woke up in sweat with difficulty breathing, pt was 86-88% on RA. Pt on 4L O2 via [place] -O2 sats improved to 92%. Pt reports dry cough.

## 2021-09-02 NOTE — H&P (Addendum)
History and Physical  [facility]  [name] [name] TFT:[id number] DOB: [birth year] DOA: 09/02/2021  PCP: [name] Chard, MD  Patient coming from: Home  Level of care: Telemetry Medical  I have personally briefly reviewed patient's old medical records in [place]  Chief Complaint: SOB   HPI: [name] [name] is a 81 year old female with long history of poorly controlled hypertension, remote CVA history, stage IV CKD, poorly controlled type 2 diabetes mellitus with complications, diabetic retinopathy, diastolic heart dysfunction, pulmonary hypertension, peripheral edema who reports that she was recently started on Lasix therapy for peripheral edema and has continued to have edema in the hands upper extremities and lower extremities.  She reports that the edema did improve after she started the Lasix however after she ran out of Lasix she has started to have symptoms of increasing shortness of breath and dyspneic cough and no chest pain.  She has been having a nonproductive cough for couple of days.  She denies having fever and chills.  She had not been using supplemental oxygen at home.  She ended up calling EMS due to severe shortness of breath symptoms and they found her to be hypoxic and placed her on supplemental oxygen with some improvement in symptoms.  She was noted to be severely hypertensive with a systolic blood pressure in the 200 range.  She responded to 4 L/min nasal cannula.  She was noted to have pitting edema in the extremities.  He was started on IV Lasix for diuresis and blood pressure management with hydralazine was started.  Admission was requested for further management.  Review of Systems: Review of Systems  Constitutional: Negative.   HENT: Negative.    Eyes: Negative.   Respiratory:  Positive for cough, sputum production and shortness of breath. Negative for wheezing.   Cardiovascular:  Positive for leg swelling. Negative for chest pain, palpitations and  orthopnea.  Gastrointestinal:  Negative for abdominal pain, heartburn, nausea and vomiting.  Genitourinary: Negative.   Musculoskeletal: Negative.   Skin: Negative.   Neurological: Negative.   Endo/Heme/Allergies: Negative.   Psychiatric/Behavioral: Negative.    All other systems reviewed and are negative.   Past Medical History:  Diagnosis Date   Diabetes mellitus ([place])    H/O: CVA (cerebrovascular accident) 2002   HTN (hypertension)    Hyperlipidemia    Pulmonary HTN ([place])    mild by echo    Past Surgical History:  Procedure Laterality Date   ABDOMINAL HYSTERECTOMY     CARDIAC CATHETERIZATION  03/17/2003   Normal, patent coronary arteries. Normal LV systolic function   CARDIOVASCULAR STRESS TEST  08/28/2006   Small sized, moderate ischemiain the Basal Inferoseptal, Basal Inferior, Mid Inferior and Apical Inferior regions   TRANSTHORACIC ECHOCARDIOGRAM  02/17/2010   EF >55%, moderate-severely thickened septum, mild-moderate mitral annular calcification     reports that she has never smoked. She has never used smokeless tobacco. She reports that she does not drink alcohol and does not use drugs.  Allergies  Allergen Reactions   Brimonidine Other (See Comments)    Eyes swell and itch   Dorzolamide     Other reaction(s): Other (See Comments) Red, inflamed eyelid, dermatitis    Timolol Other (See Comments)    Caused eyes to swell and itch    Family History  Problem Relation Age of Onset   Hypertension Mother    Hypertension Father    Heart Problems Father    Hypertension Sister    Diabetes Sister  Hypertension Brother    Diabetes Brother     Prior to Admission medications   Medication Sig Start Date End Date Taking? Authorizing Provider  amLODipine (NORVASC) 10 MG tablet Take 1 tablet (10 mg total) by mouth daily. 08/02/21  Yes [name] Chard, MD  aspirin 325 MG EC tablet Take 1 tablet (325 mg total) by mouth daily. 08/02/21  Yes [name] Chard, MD  cetirizine  (ZYRTEC ALLERGY) 10 MG tablet Take 1 tablet (10 mg total) by mouth daily. 08/02/21 08/02/22 Yes [name] Chard, MD  Cholecalciferol (VITAMIN D3) 50 MCG (2000 UT) TABS Take 1 tablet by mouth daily at 12 noon.   Yes [provider]  Ferrous Sulfate (IRON PO) Take 65 mg by mouth daily at 2 am.   Yes [provider]  fluticasone (FLONASE) 50 MCG/ACT nasal spray Place 2 sprays into both nostrils daily. 10/21/20 10/21/21 Yes [name] Brine, FNP  furosemide (LASIX) 20 MG tablet Take 1 tablet (20 mg total) by mouth as needed. 09/01/21 11/30/21 Yes [name] Brine, FNP  hydrALAZINE (APRESOLINE) 25 MG tablet Take 1.5 tablets (37.5 mg total) by mouth 2 (two) times daily. 08/03/21  Yes [name] Sine, MD  insulin degludec (TRESIBA FLEXTOUCH) 200 UNIT/ML FlexTouch Pen Inject 20 Units into the skin daily. 07/28/21  Yes [name] Chard, MD  insulin lispro (HUMALOG KWIKPEN) 200 UNIT/ML KwikPen Inject under skin 8-10 units 15 min before the 3 main meals 08/03/21  Yes [name] Kingdom, MD  latanoprost (XALATAN) 0.005 % ophthalmic solution Place 1 drop into both eyes at bedtime.  03/17/19  Yes [provider]  metoprolol succinate (TOPROL-XL) 25 MG 24 hr tablet Take 1 tablet (25 mg total) by mouth daily. 08/02/21  Yes [name] Chard, MD  Multiple Vitamin (MULTIVITAMIN) tablet Take 1 tablet by mouth daily.   Yes [provider]  rosuvastatin (CRESTOR) 10 MG tablet TAKE 1 TABLET BY MOUTH EVERY DAY 08/15/21  Yes [name] Chard, MD  Semaglutide, 1 MG/DOSE, (OZEMPIC, 1 MG/DOSE,) 2 MG/1.5ML SOPN Inject 1 mg into the skin once a week. 05/20/21  Yes [name] Kingdom, MD  ACCU-CHEK AVIVA PLUS test strip TEST THREE TIMES DAILY AS DIRECTED 07/20/20   [name] Chard, MD  B-D UF III MINI PEN NEEDLES 31G X 5 MM MISC Use as directed 4x a day 06/29/21   [name] Kingdom, MD  blood glucose meter kit and supplies Dispense based on patient and insurance preference. Use up to four times daily as directed.dx code e11.65  03/08/21   [name] Chard, MD  Blood Glucose Monitoring Suppl (TRUE METRIX METER) DEVI Use to check blood sugars 3 times a day. 03/08/21   [name] Chard, MD  Continuous Blood Gluc Receiver (DEXCOM G6 RECEIVER) DEVI Use to check blood sugar 3 times a day before meals. Dx code e11.65 07/27/21   [name] Chard, MD  Continuous Blood Gluc Sensor (DEXCOM G6 SENSOR) MISC Use to check blood sugar 3 times a day before meals. Dx code e11.65 07/27/21   [name] Chard, MD  Glucagon (GVOKE HYPOPEN 2-PACK) 0.5 MG/0.1ML SOAJ Inject 0.5 mg into the skin daily as needed. 08/02/21   [name] Chard, MD  glucose blood (TRUE METRIX BLOOD GLUCOSE TEST) test strip Use as instructed to check blood sugar 3 times a day. Dx code e11.65 03/08/21   [name] Chard, MD  Lancets [facility]) lancets Check blood sugars 3 times daily E11.65 02/12/18   [name] Chard, MD    Physical Exam: Vitals:   09/02/21 0900 09/02/21 0930 09/02/21 1013 09/02/21 1312  BP: (!) 165/63 (!) 162/59 (!) 163/70 (!) 170/57  Pulse: 85 85 85 83  Resp: _0 Temp:   98 F (36.7 C) 98.2 F (36.8 C)  TempSrc:   Oral Oral  SpO2: 95% 95% 95% 96%  Weight:   74.5 kg   Height:   _1  (1.575 m)     Constitutional: NAD, calm, comfortable Eyes: PERRL, lids and conjunctivae normal ENMT: Mucous membranes are moist. Posterior pharynx clear of any exudate or lesions.Normal dentition.  Neck: normal, supple, no masses, no thyromegaly Respiratory: breath sounds bilaterally, no wheezing, bibasilar crackles. Normal respiratory effort. No accessory muscle use.  Cardiovascular: normal s1, s2 sounds, no murmurs / rubs / gallops. No extremity edema. 2+ pedal pulses. No carotid bruits.  Abdomen: no tenderness, no masses palpated. No hepatosplenomegaly. Bowel sounds positive.  Musculoskeletal: diffuse osteoarthritic changes, 1+ edema BUEs, trace pretibial edema, no clubbing / cyanosis. No joint deformity upper and lower extremities. Good ROM, no  contractures. Normal muscle tone.  Skin: no rashes, lesions, ulcers. No induration Neurologic: CN 2-12 grossly intact. Sensation intact, DTR normal. Strength 5/5 in all 4.  Psychiatric: poor judgment and insight. Alert and oriented x 3. Normal mood.   Labs on Admission: I have personally reviewed following labs and imaging studies  CBC: Recent Labs  Lab 09/02/21 0703  WBC 16.1*  NEUTROABS 14.0*  HGB 10.2*  HCT 31.0*  MCV 91.2  PLT 026   Basic Metabolic Panel: Recent Labs  Lab 08/30/21 1153 09/02/21 0703  NA 142 141  K 4.7 4.1  CL 106 109  CO2 24 23  GLUCOSE 121* 254*  BUN 41* 44*  CREATININE 2.58* 2.77*  CALCIUM 8.9 8.9   GFR: Estimated Creatinine Clearance: 14.6 mL/min (A) (by C-G formula based on SCr of 2.77 mg/dL (H)). Liver Function Tests: Recent Labs  Lab 08/30/21 1153 09/02/21 0703  AST 14 19  ALT 11 13  ALKPHOS 101 87  BILITOT 0.3 0.4  PROT 6.3 7.1  ALBUMIN 3.6 3.4*   No results for input(s): LIPASE, AMYLASE in the last 168 hours. No results for input(s): AMMONIA in the last 168 hours. Coagulation Profile: No results for input(s): INR, PROTIME in the last 168 hours. Cardiac Enzymes: No results for input(s): CKTOTAL, CKMB, CKMBINDEX, TROPONINI in the last 168 hours. BNP (last 3 results) No results for input(s): PROBNP in the last 8760 hours. HbA1C: Recent Labs    09/02/21 0708  HGBA1C 8.4*   CBG: Recent Labs  Lab 09/02/21 0914 09/02/21 1014 09/02/21 1317  GLUCAP 258* 259* 208*   Lipid Profile: No results for input(s): CHOL, HDL, LDLCALC, TRIG, CHOLHDL, LDLDIRECT in the last 72 hours. Thyroid Function Tests: No results for input(s): TSH, T4TOTAL, FREET4, T3FREE, THYROIDAB in the last 72 hours. Anemia Panel: No results for input(s): VITAMINB12, FOLATE, FERRITIN, TIBC, IRON, RETICCTPCT in the last 72 hours. Urine analysis:    Component Value Date/Time   COLORURINE STRAW (A) 02/12/2019 1224   APPEARANCEUR CLEAR 02/12/2019 1224   LABSPEC  1.013 02/12/2019 1224   PHURINE 6.0 02/12/2019 1224   GLUCOSEU 150 (A) 02/12/2019 1224   HGBUR NEGATIVE 02/12/2019 1224   BILIRUBINUR negative 10/21/2020 1051   KETONESUR NEGATIVE 02/12/2019 1224   PROTEINUR Positive (A) 10/21/2020 1051   PROTEINUR 100 (A) 02/12/2019 1224   UROBILINOGEN 0.2 10/21/2020 1051   NITRITE negative 10/21/2020 1051   NITRITE NEGATIVE 02/12/2019 1224   LEUKOCYTESUR Negative 10/21/2020 1051   LEUKOCYTESUR NEGATIVE 02/12/2019 1224  Radiological Exams on Admission: DG Chest Port 1 View  Result Date: 09/02/2021 CLINICAL DATA:  Shortness of breath EXAM: PORTABLE CHEST 1 VIEW COMPARISON:  02/12/2019 FINDINGS: Low volume chest with hazy density at the bases. Normal heart size. Symmetric interstitial prominence and fissure thickening. IMPRESSION: Low volume chest with hazy density at the bases usually from atelectasis and pleural fluid, infection not excluded. Generally increased markings compared to prior, suspect interstitial edema. Electronically Signed   By: Jorje Guild M.D.   On: 09/02/2021 07:15    EKG: Independently reviewed.  Assessment/Plan Principal Problem:   Hypertensive urgency Active Problems:   Acute heart failure preserved EF   Chronic kidney disease (CKD), stage IV (severe) (HCC)   HTN (hypertension) Poorly controlled   Poorly controlled type 2 diabetes mellitus with circulatory disorder (HCC)   Left ventricular diastolic dysfunction, NYHA class 1   Hyperlipidemia associated with type 2 diabetes mellitus ([place])   Diabetic polyneuropathy associated with type 2 diabetes mellitus ([place])   Hypertensive nephropathy   Leukocytosis   Assessment and Plan: * Hypertensive urgency -- fortunately patient responded to IV hydralazine and resume home oral BP meds, continue to watch closely with IV lasix diuresis   Chronic kidney disease (CKD), stage IV (severe) (HCC) -- monitor renal function closely with IV lasix diuresis  -- renally dose medications    Acute heart failure preserved EF -- IV lasix ordered 40 mg every 12 hours  -- monitor I/O closely, daily weights -- monitor renal function and electrolytes closely  -- repeat CXR after diuresis  -- recent TTE Nov 2022 with preserved EF>55% and grade 1 DD  Leukocytosis -- possibly reactive, no s/s of infection found, recheck CBC in AM after treatments   Hypertensive nephropathy -- working on better blood pressure control  Diabetic polyneuropathy associated with type 2 diabetes mellitus ([place]) -- not on gabapentin or pregabalin which is a good idea with her stage IV CKD.  At this time working on better glycemic control.   Hyperlipidemia associated with type 2 diabetes mellitus ([place]) -- resume home rosuvastatin daily   Poorly controlled type 2 diabetes mellitus with circulatory disorder ([place]) -- longstanding poorly controlled disease with multiple complications -- follow up A1c.  -- resume basal bolus supplemental insulin program with frequent CBG monitoring (5x per day) -- further adjust doses as needed based on CBG readings -- no concentrated sweets or fruit juices except to treat a low blood glucose  HTN (hypertension) Poorly controlled -- not sure if due to poor compliance given history, but will resume home meds and adjust therapy as needed based on BP readings  DVT prophylaxis: heparin   Code Status: full   Family Communication: daughter at bedside, demands patient transfer to [facility]  Disposition Plan: admit to [place] called:   Admission status: INP  Level of care: [place] MD [facility] How to contact the [facility] Attending or Consulting provider [place] or covering provider during after hours [place], for this patient?  Check the care team in [facility] and look for a) attending/consulting TR[facility] provider listed and b) the [facility] team listed Log into [email or web address] and use [place]'s universal password to access. If you do not have the password,  please contact the hospital operator. Locate the [facility] provider you are looking for under [facility] and page to a number that you can be directly reached. If you still have difficulty reaching the provider, please page the [facility] (Director on Call) for  the Hospitalists listed on amion for assistance.   If 7PM-7AM, please contact night-coverage [email or web address] Password [email or web address]  09/02/2021, 1:49 PM

## 2021-09-02 NOTE — Assessment & Plan Note (Signed)
--   not sure if due to poor compliance given history, but will resume home meds and adjust therapy as needed based on BP readings

## 2021-09-02 NOTE — Assessment & Plan Note (Signed)
--   resume home rosuvastatin daily

## 2021-09-02 NOTE — Progress Notes (Signed)
Report called to [place] at [facility]. Patient being transported by [facility]. Daughters are at bedside and are aware of transfer.

## 2021-09-02 NOTE — Assessment & Plan Note (Addendum)
--  improved, but still has volume overload --likely secondary to salt --continue diuresis, metoprolol, Crestor --stop amlodipine --possibly home 5/28

## 2021-09-02 NOTE — Assessment & Plan Note (Signed)
resolved 

## 2021-09-02 NOTE — ED Notes (Signed)
After giving lasix, pt's IV noted to be infiltrated. Pharmacy notified stating not a vesicant and to continue to monitor. Pt not complaining of pain and no swelling noted at this time. Attending notified that pt likely did not get entire dose of lasix. Will continue to monitor output and site. IV removed and cool compress applied. New IV site started

## 2021-09-02 NOTE — Assessment & Plan Note (Addendum)
--   not on gabapentin or pregabalin; won't add with CKD

## 2021-09-02 NOTE — Assessment & Plan Note (Addendum)
--   longstanding poorly controlled disease with multiple complications; Hgb H4L 8.4 -- continue basal insulin; meal coverage, SSI  --CBG stable

## 2021-09-02 NOTE — TOC Progression Note (Signed)
  Transition of Care [facility]) Screening Note   Patient Details  Name: [name] [name] Date of Birth: [birth year]   Transition of Care [facility]) CM/SW Contact:    Shade Flood, LCSW Phone Number: 09/02/2021, 1:05 PM    Transition of Care Department [facility]) has reviewed patient and no TOC needs have been identified at this time. We will continue to monitor patient advancement through interdisciplinary progression rounds. If new patient transition needs arise, please place a TOC consult.

## 2021-09-02 NOTE — ED Notes (Signed)
Pt given meal tray. Nurse aware.

## 2021-09-02 NOTE — Hospital Course (Signed)
45yow PMH CKD stage IV, DM type 2, diastolic CHF presented with increasing SOB, orthopnea, lower extremity edema, she thinks tipped off by soup from [place]. Hypoxic via EMS. Admitted for acute on chronic diastolic CHF, hypertensive urgency. Improving with diuresis, likely home 1-2 days.

## 2021-09-02 NOTE — Assessment & Plan Note (Signed)
--   working on better blood pressure control

## 2021-09-02 NOTE — Assessment & Plan Note (Signed)
--   possibly reactive, no s/s of infection found, recheck CBC in AM after treatments

## 2021-09-02 NOTE — Assessment & Plan Note (Addendum)
--   monitor renal function closely with IV lasix diuresis; baseline creatinine around 2.7

## 2021-09-02 NOTE — ED Provider Notes (Addendum)
[facility] Provider Note   CSN: [id number] Arrival date & time: 09/02/21  6789     History  Chief Complaint  Patient presents with   Shortness of Breath    [name] [name] is a 81 y.o. female.  Patient presents to the emergency department for evaluation of shortness of breath.  Patient reports that she woke up this morning feeling sweaty and noticed that she was short of breath.  She has had a cough for a couple of days.  She denies chest pain.  She is brought to the ER from home by EMS.  She was noted to be mildly hypoxic, placed on oxygen during transport.  She reports some improvement with the oxygen.  She does not normally use oxygen at home.      Home Medications Prior to Admission medications   Medication Sig Start Date End Date Taking? Authorizing Provider  ACCU-CHEK AVIVA PLUS test strip TEST THREE TIMES DAILY AS DIRECTED 07/20/20   Glendale Chard, MD  amLODipine (NORVASC) 10 MG tablet Take 1 tablet (10 mg total) by mouth daily. 08/02/21   Glendale Chard, MD  aspirin 325 MG EC tablet Take 1 tablet (325 mg total) by mouth daily. 08/02/21   Glendale Chard, MD  B-D UF III MINI PEN NEEDLES 31G X 5 MM MISC Use as directed 4x a day 06/29/21   Philemon Kingdom, MD  blood glucose meter kit and supplies Dispense based on patient and insurance preference. Use up to four times daily as directed.dx code e11.65 03/08/21   Glendale Chard, MD  Blood Glucose Monitoring Suppl (TRUE METRIX METER) DEVI Use to check blood sugars 3 times a day. 03/08/21   Glendale Chard, MD  cetirizine (ZYRTEC ALLERGY) 10 MG tablet Take 1 tablet (10 mg total) by mouth daily. 08/02/21 08/02/22  Glendale Chard, MD  Cholecalciferol (VITAMIN D3) 50 MCG (2000 UT) TABS Take 1 tablet by mouth daily at 12 noon.    [provider]  Continuous Blood Gluc Receiver (DEXCOM G6 RECEIVER) DEVI Use to check blood sugar 3 times a day before meals. Dx code e11.65 07/27/21   Glendale Chard, MD   Continuous Blood Gluc Sensor (DEXCOM G6 SENSOR) MISC Use to check blood sugar 3 times a day before meals. Dx code e11.65 07/27/21   Glendale Chard, MD  Ferrous Sulfate (IRON PO) Take 65 mg by mouth daily at 2 am.    [provider]  fluticasone (FLONASE) 50 MCG/ACT nasal spray Place 2 sprays into both nostrils daily. 10/21/20 10/21/21  [name], FNP  furosemide (LASIX) 20 MG tablet Take 1 tablet (20 mg total) by mouth as needed. 09/01/21 11/30/21  [name], FNP  Glucagon (GVOKE HYPOPEN 2-PACK) 0.5 MG/0.1ML SOAJ Inject 0.5 mg into the skin daily as needed. 08/02/21   Glendale Chard, MD  glucose blood (TRUE METRIX BLOOD GLUCOSE TEST) test strip Use as instructed to check blood sugar 3 times a day. Dx code e11.65 03/08/21   Glendale Chard, MD  hydrALAZINE (APRESOLINE) 25 MG tablet Take 1.5 tablets (37.5 mg total) by mouth 2 (two) times daily. 08/03/21   Troy Sine, MD  hydrOXYzine (ATARAX/VISTARIL) 25 MG tablet Take 1 tablet by mouth at bedtime as needed. 1-2 tablets at bedtime prn 01/01/19   [provider]  insulin degludec (TRESIBA FLEXTOUCH) 200 UNIT/ML FlexTouch Pen Inject 20 Units into the skin daily. 07/28/21   Glendale Chard, MD  insulin lispro (HUMALOG KWIKPEN) 200 UNIT/ML KwikPen Inject under skin 8-10 units  15 min before the 3 main meals 08/03/21   Philemon Kingdom, MD  Lancets (ACCU-CHEK SOFT [facility]) lancets Check blood sugars 3 times daily E11.65 02/12/18   Glendale Chard, MD  latanoprost (XALATAN) 0.005 % ophthalmic solution Place 1 drop into both eyes at bedtime.  03/17/19   [provider]  metoprolol succinate (TOPROL-XL) 25 MG 24 hr tablet Take 1 tablet (25 mg total) by mouth daily. 08/02/21   Glendale Chard, MD  Multiple Vitamin (MULTIVITAMIN) tablet Take 1 tablet by mouth daily.    [provider]  rosuvastatin (CRESTOR) 10 MG tablet TAKE 1 TABLET BY MOUTH EVERY DAY 08/15/21   Glendale Chard, MD  Semaglutide, 1 MG/DOSE, (OZEMPIC, 1 MG/DOSE,) 2  MG/1.5ML SOPN Inject 1 mg into the skin once a week. 05/20/21   Philemon Kingdom, MD      Allergies    Brimonidine, Dorzolamide, and Timolol    Review of Systems   Review of Systems  Physical Exam Updated Vital Signs BP (!) 194/58   Pulse 83   Temp 97.8 F (36.6 C) (Oral)   Resp 19   Ht 5' 2"  (1.575 m)   Wt 76.7 kg   SpO2 92%   BMI 30.95 kg/m  Physical Exam Vitals and nursing note reviewed.  Constitutional:      General: She is not in acute distress.    Appearance: She is well-developed.  HENT:     Head: Normocephalic and atraumatic.     Mouth/Throat:     Mouth: Mucous membranes are moist.  Eyes:     General: Vision grossly intact. Gaze aligned appropriately.     Extraocular Movements: Extraocular movements intact.     Conjunctiva/sclera: Conjunctivae normal.  Cardiovascular:     Rate and Rhythm: Normal rate and regular rhythm.     Pulses: Normal pulses.     Heart sounds: Normal heart sounds, S1 normal and S2 normal. No murmur heard.   No friction rub. No gallop.  Pulmonary:     Effort: Pulmonary effort is normal. No respiratory distress.     Breath sounds: Decreased breath sounds present.  Abdominal:     General: Bowel sounds are normal.     Palpations: Abdomen is soft.     Tenderness: There is no abdominal tenderness. There is no guarding or rebound.     Hernia: No hernia is present.  Musculoskeletal:        General: No swelling.     Cervical back: Full passive range of motion without pain, normal range of motion and neck supple. No spinous process tenderness or muscular tenderness. Normal range of motion.     Right lower leg: 1+ Pitting Edema present.     Left lower leg: 1+ Pitting Edema present.  Skin:    General: Skin is warm and dry.     Capillary Refill: Capillary refill takes less than 2 seconds.     Findings: No ecchymosis, erythema, rash or wound.  Neurological:     General: No focal deficit present.     Mental Status: She is alert and oriented to  person, place, and time.     GCS: GCS eye subscore is 4. GCS verbal subscore is 5. GCS motor subscore is 6.     Cranial Nerves: Cranial nerves 2-12 are intact.     Sensory: Sensation is intact.     Motor: Motor function is intact.     Coordination: Coordination is intact.  Psychiatric:        Attention and  Perception: Attention normal.        Mood and Affect: Mood normal.        Speech: Speech normal.        Behavior: Behavior normal.    ED Results / Procedures / Treatments   Labs (all labs ordered are listed, but only abnormal results are displayed) Labs Reviewed  SARS CORONAVIRUS 2 BY RT PCR  CBC WITH DIFFERENTIAL/PLATELET  COMPREHENSIVE METABOLIC PANEL  BRAIN NATRIURETIC PEPTIDE  TROPONIN I (HIGH SENSITIVITY)    EKG EKG Interpretation  Date/Time:  Friday Sep 02 2021 06:27:19 EDT Ventricular Rate:  82 PR Interval:  176 QRS Duration: 146 QT Interval:  408 QTC Calculation: 477 R Axis:   17 Text Interpretation: Sinus rhythm Atrial premature complex Left bundle branch block No significant change since last tracing Confirmed by Orpah Greek [phone]) on 09/02/2021 6:31:13 AM  Radiology No results found.  Procedures Procedures    Medications Ordered in ED Medications - No data to display  ED Course/ Medical Decision Making/ A&P                           Medical Decision Making Amount and/or Complexity of Data Reviewed Labs: ordered. Radiology: ordered.  Risk Prescription drug management. Decision regarding hospitalization.   Patient presents to the emergency department for evaluation of shortness of breath.  Differential diagnosis considered includes viral URI, pneumonia, congestive heart failure, hypertensive urgency, acute coronary syndrome.  Patient presents with shortness of breath and a nonproductive cough over the past couple of days.  Breathing worsened overnight.  She is hypoxic, arrives on 4 L by nasal cannula.  She is mildly dyspneic and her  oxygen saturations are 90% on supplemental oxygen.  She does not normally use oxygen at home.  She does have bilateral pitting edema.  Blood pressure is fairly elevated around 322 systolic.  We will work-up for hypertensive urgency/pulmonary edema as well as acute congestive heart failure, and infectious etiology of her shortness of breath.    Addendum: BNP somewhat elevated.  Troponin slightly elevated.  In the setting of shortness of breath with volume overload and elevated blood pressures, this is consistent with leak.  Doubt NSTEMI.  Provide additional blood pressure control, decrease preload, initiate diuresis.  Admit to medicine.        Final Clinical Impression(s) / ED Diagnoses Final diagnoses:  SOB (shortness of breath)    Rx / DC Orders ED Discharge Orders     None         Kyung Muto, Gwenyth Allegra, MD 09/02/21 0254    Orpah Greek, MD 09/02/21 0800

## 2021-09-03 DIAGNOSIS — J9601 Acute respiratory failure with hypoxia: Secondary | ICD-10-CM

## 2021-09-03 DIAGNOSIS — D631 Anemia in chronic kidney disease: Secondary | ICD-10-CM

## 2021-09-03 DIAGNOSIS — N184 Chronic kidney disease, stage 4 (severe): Secondary | ICD-10-CM | POA: Diagnosis not present

## 2021-09-03 DIAGNOSIS — N189 Chronic kidney disease, unspecified: Secondary | ICD-10-CM

## 2021-09-03 DIAGNOSIS — I5033 Acute on chronic diastolic (congestive) heart failure: Secondary | ICD-10-CM

## 2021-09-03 DIAGNOSIS — E1159 Type 2 diabetes mellitus with other circulatory complications: Secondary | ICD-10-CM | POA: Diagnosis not present

## 2021-09-03 DIAGNOSIS — E1165 Type 2 diabetes mellitus with hyperglycemia: Secondary | ICD-10-CM

## 2021-09-03 HISTORY — DX: Acute respiratory failure with hypoxia: J96.01

## 2021-09-03 HISTORY — DX: Anemia in chronic kidney disease: N18.9

## 2021-09-03 HISTORY — DX: Anemia in chronic kidney disease: D63.1

## 2021-09-03 LAB — RENAL FUNCTION PANEL
Albumin: 2.7 g/dL — ABNORMAL LOW (ref 3.5–5.0)
Anion gap: 10 (ref 5–15)
BUN: 41 mg/dL — ABNORMAL HIGH (ref 8–23)
CO2: 24 mmol/L (ref 22–32)
Calcium: 9 mg/dL (ref 8.9–10.3)
Chloride: 109 mmol/L (ref 98–111)
Creatinine, Ser: 2.9 mg/dL — ABNORMAL HIGH (ref 0.44–1.00)
GFR, Estimated: 16 mL/min — ABNORMAL LOW (ref >60)
Glucose, Bld: 87 mg/dL (ref 70–99)
Phosphorus: 4.2 mg/dL (ref 2.5–4.6)
Potassium: 3.4 mmol/L — ABNORMAL LOW (ref 3.5–5.1)
Sodium: 143 mmol/L (ref 135–145)

## 2021-09-03 LAB — GLUCOSE, CAPILLARY
Glucose-Capillary: 103 mg/dL — ABNORMAL HIGH (ref 70–99)
Glucose-Capillary: 107 mg/dL — ABNORMAL HIGH (ref 70–99)
Glucose-Capillary: 116 mg/dL — ABNORMAL HIGH (ref 70–99)
Glucose-Capillary: 151 mg/dL — ABNORMAL HIGH (ref 70–99)
Glucose-Capillary: 84 mg/dL (ref 70–99)

## 2021-09-03 LAB — CBC
HCT: 27.7 % — ABNORMAL LOW (ref 36.0–46.0)
Hemoglobin: 9.3 g/dL — ABNORMAL LOW (ref 12.0–15.0)
MCH: 29.9 pg (ref 26.0–34.0)
MCHC: 33.6 g/dL (ref 30.0–36.0)
MCV: 89.1 fL (ref 80.0–100.0)
Platelets: 295 10*3/uL (ref 150–400)
RBC: 3.11 MIL/uL — ABNORMAL LOW (ref 3.87–5.11)
RDW: 14.7 % (ref 11.5–15.5)
WBC: 10.8 10*3/uL — ABNORMAL HIGH (ref 4.0–10.5)
nRBC: 0 % (ref 0.0–0.2)

## 2021-09-03 LAB — MAGNESIUM: Magnesium: 2.4 mg/dL (ref 1.7–2.4)

## 2021-09-03 MED ORDER — POTASSIUM CHLORIDE CRYS ER 20 MEQ PO TBCR
40.0000 meq | EXTENDED_RELEASE_TABLET | Freq: Once | ORAL | Status: AC
  Administered 2021-09-03: 40 meq via ORAL
  Filled 2021-09-03: qty 2

## 2021-09-03 NOTE — Assessment & Plan Note (Signed)
stable °

## 2021-09-03 NOTE — Assessment & Plan Note (Addendum)
--  secondary to acute CHF; wean to RA; documented by RN Turner 5/26

## 2021-09-03 NOTE — Progress Notes (Addendum)
  Progress Note   Patient: [name] [name] QQP:[id number] DOB: 1940-09-25 DOA: 09/02/2021     1 DOS: the patient was seen and examined on 09/03/2021   Brief hospital course: 50yow PMH CKD stage IV, DM type 2, diastolic CHF presented with increasing SOB, orthopnea, lower extremity edema, she thinks tipped off by soup from [place]. Hypoxic via EMS. Admitted for acute on chronic diastolic CHF, hypertensive urgency. Improving with diuresis, likely home 1-2 days.  Assessment and Plan: * Acute on chronic diastolic CHF (congestive heart failure) (HCC) --improved, but still has volume overload --likely secondary to salt --continue diuresis, metoprolol, Crestor --stop amlodipine --possibly home 5/28  Acute respiratory failure with hypoxia (HCC) --secondary to acute CHF; wean to RA; documented by RN Turner 5/26  Hypertensive urgency -- resolved  Chronic kidney disease (CKD), stage IV (severe) (HCC) -- monitor renal function closely with IV lasix diuresis; baseline creatinine around 2.7  Poorly controlled type 2 diabetes mellitus with circulatory disorder (HCC) -- longstanding poorly controlled disease with multiple complications; Hgb Z1I 8.4 -- continue basal insulin; meal coverage, SSI  --CBG stable  Diabetic polyneuropathy associated with type 2 diabetes mellitus (HCC) -- not on gabapentin or pregabalin; won't add with CKD  HTN (hypertension) Poorly controlled -- not sure if due to poor compliance given history, but will resume home meds and adjust therapy as needed based on BP readings  Anemia due to chronic kidney disease --stable  Hypertensive nephropathy -- long-term BP control  Hyperlipidemia associated with type 2 diabetes mellitus ([place]) -- continue rosuvastatin   Hypokalemia --replete      Subjective:  Feels better, breathing better Ate soup from [place], thinks made symptoms worse at home Orthopnea and progressive LE edema at home Doesn't weigh self  Physical  Exam: Vitals:   09/03/21 0346 09/03/21 0713 09/03/21 0717 09/03/21 1136  BP:  (!) 184/60 (!) 163/96 (!) 174/63  Pulse:  78  84  Resp: 19 17  20   Temp: 98.7 F (37.1 C) 98.7 F (37.1 C)  98.1 F (36.7 C)  TempSrc: Oral Oral  Oral  SpO2: 96% 95%  93%  Weight: 74.5 kg     Height:       Physical Exam Vitals reviewed.  Constitutional:      General: She is not in acute distress.    Appearance: She is not ill-appearing or toxic-appearing.  Cardiovascular:     Rate and Rhythm: Normal rate and regular rhythm.     Heart sounds: No murmur heard.    Comments: Telemetry SR Pulmonary:     Effort: Pulmonary effort is normal. No respiratory distress.     Breath sounds: No wheezing, rhonchi or rales.  Musculoskeletal:     Right lower leg: No edema (thigh).     Left lower leg: Edema (thigh) present.  Neurological:     Mental Status: She is alert.  Psychiatric:        Mood and Affect: Mood normal.        Behavior: Behavior normal.    Data Reviewed:  UOP not strictly recorded I/O not accurate K+ 3.4 Mg 2+ WNL Hgb stable 9.3 Hgb A1c 8.4 CXR w/ interstitial edema, pleural effusions R>L EKG SR w/ chronic LBBB  Family Communication: 2 daughters at bedside  Disposition: Status is: Inpatient Remains inpatient appropriate because: acute CHF, hypoxica  Planned Discharge Destination: Home    Time spent: 35 minutes  Author: Murray Hodgkins, MD 09/03/2021 3:14 PM  For on call review [email or web address].

## 2021-09-03 NOTE — Plan of Care (Signed)
  Problem: Clinical Measurements: Goal: Ability to maintain clinical measurements within normal limits will improve Outcome: Progressing   

## 2021-09-03 NOTE — Progress Notes (Signed)
SATURATION QUALIFICATIONS: (This note is used to comply with regulatory documentation for home oxygen)  Patient Saturations on Room Air at Rest = 92%  Patient Saturations on Room Air while Ambulating = 86%  Patient Saturations on 3 Liters of oxygen while Ambulating = 93%  Please briefly explain why patient needs home oxygen: O2 sat drops with ambulation

## 2021-09-04 LAB — RENAL FUNCTION PANEL
Albumin: 2.9 g/dL — ABNORMAL LOW (ref 3.5–5.0)
Anion gap: 11 (ref 5–15)
BUN: 40 mg/dL — ABNORMAL HIGH (ref 8–23)
CO2: 23 mmol/L (ref 22–32)
Calcium: 9.1 mg/dL (ref 8.9–10.3)
Chloride: 111 mmol/L (ref 98–111)
Creatinine, Ser: 2.89 mg/dL — ABNORMAL HIGH (ref 0.44–1.00)
GFR, Estimated: 16 mL/min — ABNORMAL LOW (ref >60)
Glucose, Bld: 130 mg/dL — ABNORMAL HIGH (ref 70–99)
Phosphorus: 3.8 mg/dL (ref 2.5–4.6)
Potassium: 3.5 mmol/L (ref 3.5–5.1)
Sodium: 145 mmol/L (ref 135–145)

## 2021-09-04 LAB — GLUCOSE, CAPILLARY
Glucose-Capillary: 142 mg/dL — ABNORMAL HIGH (ref 70–99)
Glucose-Capillary: 194 mg/dL — ABNORMAL HIGH (ref 70–99)
Glucose-Capillary: 152 mg/dL — ABNORMAL HIGH (ref 70–99)

## 2021-09-04 MED ORDER — FUROSEMIDE 20 MG PO TABS: 20.0000 mg | ORAL_TABLET | ORAL | Status: DC

## 2021-09-04 MED ORDER — HYDROXYZINE HCL 25 MG PO TABS
25.0000 mg | ORAL_TABLET | Freq: Three times a day (TID) | ORAL | 0 refills | Status: DC | PRN
Start: 2021-09-04 — End: 2021-12-16

## 2021-09-04 MED ORDER — HYDROXYZINE HCL 25 MG PO TABS
25.0000 mg | ORAL_TABLET | Freq: Three times a day (TID) | ORAL | Status: DC | PRN
  Administered 2021-09-04: 25 mg via ORAL
  Filled 2021-09-04: qty 1

## 2021-09-04 NOTE — Plan of Care (Signed)
  Problem: Clinical Measurements: Goal: Ability to maintain clinical measurements within normal limits will improve Outcome: Progressing   

## 2021-09-04 NOTE — Progress Notes (Signed)
SATURATION QUALIFICATIONS: (This note is used to comply with regulatory documentation for home oxygen)  Patient Saturations on Room Air at Rest = 95%  Patient Saturations on Room Air while Ambulating = 92%    Please briefly explain why patient needs home oxygen:  Deyonna Fitzsimmons, Tivis Ringer, RN

## 2021-09-04 NOTE — Progress Notes (Signed)
RN went over discharge instructions with patient and patient's family at the bedside. RN made patient aware of medication changes and follow up appointments. Patient and patient's daughters verbalize understanding. NT removed PIV and removed tele monitor. RN notified CCMD of patient's discharge. Patient is currently getting dressed. Transportation is at the bedside.

## 2021-09-04 NOTE — Discharge Summary (Signed)
Physician Discharge Summary   Patient: [name] [name] MRN: [id number] DOB: Jul 22, 1940  Admit date:     09/02/2021  Discharge date: 09/04/21  Discharge Physician: Murray Hodgkins   PCP: Glendale Chard, MD   Recommendations at discharge:   * Acute on chronic diastolic CHF (congestive heart failure) [facility])  Discharge Diagnoses: Principal Problem:   Acute on chronic diastolic CHF (congestive heart failure) ([place]) Active Problems:   Hypertensive urgency   Acute respiratory failure with hypoxia ([place])   Poorly controlled type 2 diabetes mellitus with circulatory disorder ([place])   Chronic kidney disease (CKD), stage IV (severe) ([place])   HTN (hypertension) Poorly controlled   Diabetic polyneuropathy associated with type 2 diabetes mellitus ([place])   Hyperlipidemia associated with type 2 diabetes mellitus ([place])   Hypertensive nephropathy   Anemia due to chronic kidney disease   Left ventricular diastolic dysfunction, NYHA class 1  Resolved Problems:   * No resolved hospital problems. *  Hospital Course: 58yow PMH CKD stage IV, DM type 2, diastolic CHF presented with increasing SOB, orthopnea, lower extremity edema, she thinks tipped off by soup from [place]. Hypoxic via EMS. Admitted for acute on chronic diastolic CHF, hypertensive urgency. Improved with diuresis, hypoxia resolved, discharged home in good condition.  * Acute on chronic diastolic CHF (congestive heart failure) (HCC) --resolved with diuresis --likely secondary to salt intake --metoprolol, Crestor  Acute respiratory failure with hypoxia (HCC) --secondary to acute CHF; resolved; weaned to RA; documented by RN Turner 5/26  Hypertensive urgency -- resolved  Chronic kidney disease (CKD), stage IV (severe) (HCC) -- overall stable with diuresis   Poorly controlled type 2 diabetes mellitus with circulatory disorder ([place]) -- longstanding poorly controlled disease with multiple complications; Hgb J6R 8.4 -- continue basal  insulin --CBG stable  Diabetic polyneuropathy associated with type 2 diabetes mellitus ([place]) -- not on gabapentin or pregabalin; won't add with CKD  HTN (hypertension) Poorly controlled -- not sure if due to poor compliance given history; continue home meds   Anemia due to chronic kidney disease --stable  Hypertensive nephropathy -- long-term BP control  Hyperlipidemia associated with type 2 diabetes mellitus ([place]) -- continue rosuvastatin         Consultants: None Procedures performed: None  Disposition: Home Diet recommendation:  Discharge Diet Orders (From admission, onward)     Start     Ordered   09/04/21 0000  Diet - low sodium heart healthy        09/04/21 1404   09/04/21 0000  Diet Carb Modified        09/04/21 1404           Cardiac and Carb modified diet DISCHARGE MEDICATION: Allergies as of 09/04/2021       Reactions   Brimonidine Other (See Comments)   Eyes swell and itch   Dorzolamide    Other reaction(s): Other (See Comments) Red, inflamed eyelid, dermatitis    Timolol Other (See Comments)   Caused eyes to swell and itch        Medication List     TAKE these medications    Accu-Chek Aviva Plus test strip Generic drug: glucose blood TEST THREE TIMES DAILY AS DIRECTED   True Metrix Blood Glucose Test test strip Generic drug: glucose blood Use as instructed to check blood sugar 3 times a day. Dx code e11.65   accu-chek soft touch lancets Check blood sugars 3 times daily E11.65   amLODipine 10 MG tablet Commonly known as: NORVASC Take 1 tablet (  10 mg total) by mouth daily.   aspirin EC 325 MG tablet Take 1 tablet (325 mg total) by mouth daily.   B-D UF III MINI PEN NEEDLES 31G X 5 MM Misc Generic drug: Insulin Pen Needle Use as directed 4x a day   blood glucose meter kit and supplies Dispense based on patient and insurance preference. Use up to four times daily as directed.dx code e11.65   cetirizine 10 MG tablet Commonly  known as: ZyrTEC Allergy Take 1 tablet (10 mg total) by mouth daily.   Dexcom G6 Receiver Devi Use to check blood sugar 3 times a day before meals. Dx code e11.65   Dexcom G6 Sensor Misc Use to check blood sugar 3 times a day before meals. Dx code e11.65   fluticasone 50 MCG/ACT nasal spray Commonly known as: Flonase Place 2 sprays into both nostrils daily.   furosemide 20 MG tablet Commonly known as: LASIX Take 1 tablet (20 mg total) by mouth every other day. What changed:  when to take this reasons to take this   Gvoke HypoPen 2-Pack 0.5 MG/0.1ML Soaj Generic drug: Glucagon Inject 0.5 mg into the skin daily as needed.   HumaLOG KwikPen 200 UNIT/ML KwikPen Generic drug: insulin lispro Inject under skin 8-10 units 15 min before the 3 main meals   hydrALAZINE 25 MG tablet Commonly known as: APRESOLINE Take 1.5 tablets (37.5 mg total) by mouth 2 (two) times daily.   hydrOXYzine 25 MG tablet Commonly known as: ATARAX Take 1 tablet (25 mg total) by mouth 3 (three) times daily as needed for itching.   IRON PO Take 65 mg by mouth daily at 2 am.   latanoprost 0.005 % ophthalmic solution Commonly known as: XALATAN Place 1 drop into both eyes at bedtime.   metoprolol succinate 25 MG 24 hr tablet Commonly known as: TOPROL-XL Take 1 tablet (25 mg total) by mouth daily.   multivitamin tablet Take 1 tablet by mouth daily.   Ozempic (1 MG/DOSE) 2 MG/1.5ML Sopn Generic drug: Semaglutide (1 MG/DOSE) Inject 1 mg into the skin once a week.   rosuvastatin 10 MG tablet Commonly known as: CRESTOR TAKE 1 TABLET BY MOUTH EVERY DAY   Tresiba FlexTouch 200 UNIT/ML FlexTouch Pen Generic drug: insulin degludec Inject 20 Units into the skin daily.   True Metrix Meter Devi Use to check blood sugars 3 times a day.   Vitamin D3 50 MCG (2000 UT) Tabs Take 1 tablet by mouth daily at 12 noon.               Durable Medical Equipment  (From admission, onward)            Start     Ordered   09/04/21 0826  For home use only DME oxygen  Once       Question Answer Comment  Length of Need 6 Months   Mode or (Route) Nasal cannula   Liters per Minute 3   Frequency Continuous (stationary and portable oxygen unit needed)   Oxygen delivery system Gas      09/04/21 0825            Follow-up Information     Troy Sine, MD. Schedule an appointment as soon as possible for a visit in 2 week(s).   Specialty: Cardiology Contact information: [address] [place]  [place] [address] [phone]                Feels better, breathing better  Discharge  Exam: Filed Weights   09/03/21 0346 09/04/21 0101 09/04/21 0500  Weight: 74.5 kg 72.4 kg 72.4 kg   Physical Exam Vitals reviewed.  Constitutional:      General: She is not in acute distress.    Appearance: She is not ill-appearing or toxic-appearing.  Cardiovascular:     Rate and Rhythm: Normal rate and regular rhythm.     Heart sounds: No murmur heard. Pulmonary:     Effort: Pulmonary effort is normal. No respiratory distress.     Breath sounds: No wheezing, rhonchi or rales.  Neurological:     Mental Status: She is alert.  Psychiatric:        Mood and Affect: Mood normal.        Behavior: Behavior normal.     Condition at discharge: good  The results of significant diagnostics from this hospitalization (including imaging, microbiology, ancillary and laboratory) are listed below for reference.   Imaging Studies: DG Chest Port 1 View  Result Date: 09/02/2021 CLINICAL DATA:  Shortness of breath EXAM: PORTABLE CHEST 1 VIEW COMPARISON:  02/12/2019 FINDINGS: Low volume chest with hazy density at the bases. Normal heart size. Symmetric interstitial prominence and fissure thickening. IMPRESSION: Low volume chest with hazy density at the bases usually from atelectasis and pleural fluid, infection not excluded. Generally increased markings compared to prior, suspect interstitial  edema. Electronically Signed   By: Jorje Guild M.D.   On: 09/02/2021 07:15    Microbiology: Results for orders placed or performed during the hospital encounter of 09/02/21  SARS Coronavirus 2 by RT PCR (hospital order, performed in [facility] hospital lab) *cepheid single result test* Anterior Nasal Swab     Status: None   Collection Time: 09/02/21  6:44 AM   Specimen: Anterior Nasal Swab  Result Value Ref Range Status   SARS Coronavirus 2 by RT PCR NEGATIVE NEGATIVE Final    Comment: (NOTE) SARS-CoV-2 target nucleic acids are NOT DETECTED.  The SARS-CoV-2 RNA is generally detectable in upper and lower respiratory specimens during the acute phase of infection. The lowest concentration of SARS-CoV-2 viral copies this assay can detect is 250 copies / mL. A negative result does not preclude SARS-CoV-2 infection and should not be used as the sole basis for treatment or other patient management decisions.  A negative result may occur with improper specimen collection / handling, submission of specimen other than nasopharyngeal swab, presence of viral mutation(s) within the areas targeted by this assay, and inadequate number of viral copies (<250 copies / mL). A negative result must be combined with clinical observations, patient history, and epidemiological information.  Fact Sheet for Patients:   https://www.patel.info/  Fact Sheet for Healthcare Providers: https://hall.com/  This test is not yet approved or  cleared by the [place] FDA and has been authorized for detection and/or diagnosis of SARS-CoV-2 by FDA under an Emergency Use Authorization (EUA).  This EUA will remain in effect (meaning this test can be used) for the duration of the COVID-19 declaration under Section 564(b)(1) of the Act, 21 U.S.C. section 360bbb-3(b)(1), unless the authorization is terminated or revoked sooner.  Performed at [facility], [address], [place], [place] [address]     Labs: CBC: Recent Labs  Lab 09/02/21 0703 09/03/21 0428  WBC 16.1* 10.8*  NEUTROABS 14.0*  --   HGB 10.2* 9.3*  HCT 31.0* 27.7*  MCV 91.2 89.1  PLT 367 201   Basic Metabolic Panel: Recent Labs  Lab 08/30/21 1153 09/02/21 0703  09/03/21 0428 09/04/21 0304  NA 142 141 143 145  K 4.7 4.1 3.4* 3.5  CL 106 109 109 111  CO2 _0 GLUCOSE 121* 254* 87 130*  BUN 41* 44* 41* 40*  CREATININE 2.58* 2.77* 2.90* 2.89*  CALCIUM 8.9 8.9 9.0 9.1  MG  --   --  2.4  --   PHOS  --   --  4.2 3.8   Liver Function Tests: Recent Labs  Lab 08/30/21 1153 09/02/21 0703 09/03/21 0428 09/04/21 0304  AST 14 19  --   --   ALT 11 13  --   --   ALKPHOS 101 87  --   --   BILITOT 0.3 0.4  --   --   PROT 6.3 7.1  --   --   ALBUMIN 3.6 3.4* 2.7* 2.9*   CBG: Recent Labs  Lab 09/03/21 1557 09/03/21 2111 09/04/21 0100 09/04/21 0538 09/04/21 1125  GLUCAP 151* 84 142* 194* 152*    Discharge time spent: less than 30 minutes.  Signed: Murray Hodgkins, MD [facility] 09/04/2021

## 2021-09-04 NOTE — Plan of Care (Signed)
  Problem: Clinical Measurements: Goal: Will remain free from infection Outcome: Completed/Met Goal: Respiratory complications will improve Outcome: Completed/Met   Problem: Pain Managment: Goal: General experience of comfort will improve Outcome: Completed/Met

## 2021-09-05 ENCOUNTER — Encounter: Payer: Self-pay | Admitting: Cardiovascular Disease

## 2021-09-05 ENCOUNTER — Encounter: Payer: Self-pay | Admitting: Nurse Practitioner

## 2021-09-06 ENCOUNTER — Telehealth: Payer: Self-pay

## 2021-09-06 NOTE — Telephone Encounter (Signed)
Transition Care Management Follow-up Telephone Call Date of discharge and from where: [name] 09/04/21 How have you been since you were released from the hospital? She feeling pretty good Any questions or concerns? Yes she is having a cough  Items Reviewed: Did the pt receive and understand the discharge instructions provided? Yes  Medications obtained and verified? Yes  Other? No  Any new allergies since your discharge? Yes  Dietary orders reviewed? Yes low sodium Do you have support at home? Yes   Home Care and Equipment/Supplies: Were home health services ordered? not applicable If so, what is the name of the agency? N/a  Has the agency set up a time to come to the patient's home? not applicable Were any new equipment or medical supplies ordered?  No What is the name of the medical supply agency? N/a Were you able to get the supplies/equipment? not applicable Do you have any questions related to the use of the equipment or supplies? No  Functional Questionnaire: (I = Independent and D = Dependent) ADLs: D  Bathing/Dressing- D  Meal Prep- D  Eating- I  Maintaining continence- I  Transferring/Ambulation- D  Managing Meds- D  Follow up appointments reviewed:  PCP Hospital f/u appt confirmed? Yes  Scheduled to see n/a on n/a @ n/a. [facility] f/u appt confirmed? Yes  Scheduled to see Dr.Kelly on 09/08/21 @ 2pm. Are transportation arrangements needed? No  If their condition worsens, is the pt aware to call PCP or go to the Emergency Dept.? Yes Was the patient provided with contact information for the PCP's office or ED? Yes Was to pt encouraged to call back with questions or concerns? Yes

## 2021-09-06 NOTE — Progress Notes (Unsigned)
Cardiology Office Note:    Date:  09/08/2021   ID:  [name], [name] 1940/05/21, MRN [id number]  PCP:  Glendale Chard, MD   [facility] Providers Cardiologist:  Shelva Majestic, MD { Referring MD: Glendale Chard, MD   Chief Complaint  Patient presents with   Hospitalization Follow-up    HFpEF    History of Present Illness:    [name] [name] is a 81 y.o. female with a hx of HFpEF, hypertension, uncontrolled DM 2, CKD stage IV, diabetic polyneuropathy, hyperlipidemia, due to chronic disease, remote CVA 2002.  She had normal coronaries by heart catheterization in 2004.  Normal nuclear stress test May 2008.  Echocardiogram November 2011 with asymmetric left ventricular hypertrophy with grade 1 diastolic dysfunction, increased LA pressure, mild to moderate MAC.  She reestablish care with Dr. Claiborne Billings December 2022.  Echocardiogram 04/05/2021 showed an LVEF 65 to 81%, grade 1 diastolic dysfunction, moderate LVH, severe MAC, and no aortic stenosis.  She was hospitalized in May 2023 with lower extremity swelling and dyspnea.  Cardiology was not consulted during this admission.  She was treated with IV diuresis for acute on chronic diastolic heart failure, hypertensive urgency, and acute respiratory failure with hypoxia.  The patient suspects this exacerbation may have been caused by soup from [place].  She presents for scheduled cardiology follow-up after hospital discharge. She feels great since discharge. Answer many questions, clarify hydralazine dosing. No cardiac complaints today.   Past Medical History:  Diagnosis Date   Diabetes mellitus ([place])    H/O: CVA (cerebrovascular accident) 2002   HTN (hypertension)    Hyperlipidemia    Pulmonary HTN ([place])    mild by echo    Past Surgical History:  Procedure Laterality Date   ABDOMINAL HYSTERECTOMY     CARDIAC CATHETERIZATION  03/17/2003   Normal, patent coronary arteries. Normal LV systolic function   CARDIOVASCULAR STRESS TEST   08/28/2006   Small sized, moderate ischemiain the Basal Inferoseptal, Basal Inferior, Mid Inferior and Apical Inferior regions   TRANSTHORACIC ECHOCARDIOGRAM  02/17/2010   EF >55%, moderate-severely thickened septum, mild-moderate mitral annular calcification    Current Medications: Current Meds  Medication Sig   ACCU-CHEK AVIVA PLUS test strip TEST THREE TIMES DAILY AS DIRECTED   amLODipine (NORVASC) 10 MG tablet Take 1 tablet (10 mg total) by mouth daily.   azelastine (ASTELIN) 0.1 % nasal spray Place 1 spray into both nostrils 2 (two) times daily. Use in each nostril as directed   B-D UF III MINI PEN NEEDLES 31G X 5 MM MISC Use as directed 4x a day   blood glucose meter kit and supplies Dispense based on patient and insurance preference. Use up to four times daily as directed.dx code e11.65   Blood Glucose Monitoring Suppl (TRUE METRIX METER) DEVI Use to check blood sugars 3 times a day.   cetirizine (ZYRTEC ALLERGY) 10 MG tablet Take 1 tablet (10 mg total) by mouth daily.   Cholecalciferol (VITAMIN D3) 50 MCG (2000 UT) TABS Take 1 tablet by mouth daily at 12 noon.   Continuous Blood Gluc Receiver (DEXCOM G6 RECEIVER) DEVI Use to check blood sugar 3 times a day before meals. Dx code e11.65   Continuous Blood Gluc Sensor (DEXCOM G6 SENSOR) MISC Use to check blood sugar 3 times a day before meals. Dx code e11.65   Ferrous Sulfate (IRON PO) Take 65 mg by mouth daily at 2 am.   fluticasone (FLONASE) 50 MCG/ACT nasal spray Place 2 sprays into  both nostrils daily.   furosemide (LASIX) 20 MG tablet Take 1 tablet (20 mg total) by mouth every other day.   Glucagon (GVOKE HYPOPEN 2-PACK) 0.5 MG/0.1ML SOAJ Inject 0.5 mg into the skin daily as needed.   glucose blood (TRUE METRIX BLOOD GLUCOSE TEST) test strip Use as instructed to check blood sugar 3 times a day. Dx code e11.65   hydrALAZINE (APRESOLINE) 25 MG tablet Take 1.5 tablets (37.5 mg total) by mouth 2 (two) times daily.   hydrOXYzine (ATARAX)  25 MG tablet Take 1 tablet (25 mg total) by mouth 3 (three) times daily as needed for itching.   insulin degludec (TRESIBA FLEXTOUCH) 200 UNIT/ML FlexTouch Pen Inject 20 Units into the skin daily.   insulin lispro (HUMALOG KWIKPEN) 200 UNIT/ML KwikPen Inject under skin 8-10 units 15 min before the 3 main meals   Lancets (ACCU-CHEK SOFT TOUCH) lancets Check blood sugars 3 times daily E11.65   latanoprost (XALATAN) 0.005 % ophthalmic solution Place 1 drop into both eyes at bedtime.    metoprolol succinate (TOPROL-XL) 25 MG 24 hr tablet Take 1 tablet (25 mg total) by mouth daily.   Multiple Vitamin (MULTIVITAMIN) tablet Take 1 tablet by mouth daily.   Semaglutide, 1 MG/DOSE, (OZEMPIC, 1 MG/DOSE,) 2 MG/1.5ML SOPN Inject 1 mg into the skin once a week.   [DISCONTINUED] aspirin 325 MG EC tablet Take 1 tablet (325 mg total) by mouth daily.   [DISCONTINUED] rosuvastatin (CRESTOR) 10 MG tablet TAKE 1 TABLET BY MOUTH EVERY DAY     Allergies:   Brimonidine, Dorzolamide, and Timolol   Social History   Socioeconomic History   Marital status: Married    Spouse name: Not on file   Number of children: Not on file   Years of education: Not on file   Highest education level: Not on file  Occupational History   Occupation: retired  Tobacco Use   Smoking status: Never   Smokeless tobacco: Never  Vaping Use   Vaping Use: Never used  Substance and Sexual Activity   Alcohol use: No   Drug use: Never   Sexual activity: Not Currently  Other Topics Concern   Not on file  Social History Narrative   Not on file   Social Determinants of Health   Financial Resource Strain: Low Risk    Difficulty of Paying Living Expenses: Not hard at all  Food Insecurity: No Food Insecurity   Worried About Charity fundraiser in the Last Year: Never true   [place] in the Last Year: Never true  Transportation Needs: No Transportation Needs   Lack of Transportation (Medical): No   Lack of Transportation  (Non-Medical): No  Physical Activity: Inactive   Days of Exercise per Week: 0 days   Minutes of Exercise per Session: 0 min  Stress: No Stress Concern Present   Feeling of Stress : Not at all  Social Connections: Not on file     Family History: The patient's family history includes Diabetes in her brother and sister; Heart Problems in her father; Hypertension in her brother, father, mother, and sister.  ROS:   Please see the history of present illness.     All other systems reviewed and are negative.  EKGs/Labs/Other Studies Reviewed:    The following studies were reviewed today:  Echo 04/05/21: 1. Moderate septal hypertrophy with otherwise mild concentric LVH. Left  ventricular ejection fraction, by estimation, is 65 to 70%. The left  ventricle has normal function. The  left ventricle has no regional wall  motion abnormalities. There is moderate  left ventricular hypertrophy. Left ventricular diastolic parameters are  consistent with Grade I diastolic dysfunction (impaired relaxation).   2. Right ventricular systolic function is normal. The right ventricular  size is normal.   3. Compared with the echo 02/2010, mitral valve mean gradient has  increased from 1.8 mmHg to 4 mmHg. Mitral valve area by PHT is unchanged.  The mitral valve is normal in structure. Trivial mitral valve  regurgitation. Mild mitral stenosis. The mean  mitral valve gradient is 4.0 mmHg. Severe mitral annular calcification.   4. The aortic valve is tricuspid. Aortic valve regurgitation is not  visualized. No aortic stenosis is present.   5. The inferior vena cava is normal in size with greater than 50%  respiratory variability, suggesting right atrial pressure of 3 mmHg.    EKG:  EKG is not ordered today.   Recent Labs: 02/17/2021: TSH 1.520 09/02/2021: ALT 13; B Natriuretic Peptide 334.0 09/03/2021: Hemoglobin 9.3; Magnesium 2.4; Platelets 295 09/04/2021: BUN 40; Creatinine, Ser 2.89; Potassium 3.5;  Sodium 145  Recent Lipid Panel    Component Value Date/Time   CHOL 166 08/30/2021 1153   TRIG 143 08/30/2021 1153   HDL 40 08/30/2021 1153   CHOLHDL 4.2 08/30/2021 1153   CHOLHDL 4 10/09/2018 0905   VLDL 37.8 10/09/2018 0905   LDLCALC 101 (H) 08/30/2021 1153     Risk Assessment/Calculations:           Physical Exam:    VS:  BP (!) 160/60   Pulse 83   Ht _0  (1.575 m)   Wt 158 lb 6.4 oz (71.8 kg)   SpO2 97%   BMI 28.97 kg/m     Wt Readings from Last 3 Encounters:  09/08/21 158 lb 6.4 oz (71.8 kg)  09/07/21 156 lb (70.8 kg)  09/04/21 159 lb 9.8 oz (72.4 kg)     GEN:  Well nourished, well developed in no acute distress HEENT: Normal NECK: No JVD; No carotid bruits LYMPHATICS: No lymphadenopathy CARDIAC: RRR, systolic murmur 3/6 RESPIRATORY:  Clear to auscultation without rales, wheezing or rhonchi  ABDOMEN: Soft, non-tender, non-distended MUSCULOSKELETAL:  No edema; No deformity  SKIN: Warm and dry NEUROLOGIC:  Alert and oriented x 3 PSYCHIATRIC:  Normal affect   ASSESSMENT:    1. Chronic diastolic heart failure ([place])   2. Severe mitral annular calcification   3. Hypertension, unspecified type   4. CKD (chronic kidney disease) stage 4, GFR 15-29 ml/min (HCC)   5. Hyperlipidemia associated with type 2 diabetes mellitus ([place])   6. Cerebrovascular accident (CVA), unspecified mechanism ([place])    PLAN:    In order of problems listed above:  Chronic diastolic heart failure Severe MAC - discharged on 20 mg Lasix every other day - appears near euvolemic today   Hypertension Continue 37.5 mg hydralazine twice daily, 10 mg amlodipine, 25 mg Toprol - keep BP log 2 hrs after morning medication   CKD stage IV She follows with nephrology in [place]   Hyperlipidemia with LDL goal less than 70 given MAC 08/30/2021: Cholesterol, Total 166; HDL 40; LDL Chol Calc (NIH) 101; Triglycerides 143 10 mg crestor Given MAC, will increase this to 20 mg   Remote  CVA Has been on 325 mg ASA since her stroke 20 years ago. Will decrease this to 81 mg ASA.   Follow up as scheduled.       Medication Adjustments/Labs and Tests Ordered:  Current medicines are reviewed at length with the patient today.  Concerns regarding medicines are outlined above.  No orders of the defined types were placed in this encounter.  Meds ordered this encounter  Medications   aspirin EC 81 MG tablet    Sig: Take 1 tablet (81 mg total) by mouth daily.   rosuvastatin (CRESTOR) 20 MG tablet    Sig: Take 1 tablet (20 mg total) by mouth daily.    Dispense:  90 tablet    Refill:  3    Patient Instructions  Medication Instructions:  INCREASE Crestor to 20 mg daily DECREASE Aspirin to 81 mg daily *If you need a refill on your cardiac medications before your next appointment, please call your pharmacy*  Lab Work: NONE ordered at this time of appointment   If you have labs (blood work) drawn today and your tests are completely normal, you will receive your results only by: [place] (if you have MyChart) OR A paper copy in the mail If you have any lab test that is abnormal or we need to change your treatment, we will call you to review the results.  Testing/Procedures: NONE ordered at this time of appointment   Follow-Up: At [facility], you and your health needs are our priority.  As part of our continuing mission to provide you with exceptional heart care, we have created designated Provider Care Teams.  These Care Teams include your primary Cardiologist (physician) and Advanced Practice Providers (APPs -  Physician Assistants and Nurse Practitioners) who all work together to provide you with the care you need, when you need it.   Your next appointment:   As scheduled    The format for your next appointment:   In Person  Provider:   Shelva Majestic, MD     Other Instructions   Important Information About Sugar         Signed, Ledora Bottcher, [place]  09/08/2021 4:06 PM    [place]

## 2021-09-07 ENCOUNTER — Encounter: Payer: Self-pay | Admitting: Internal Medicine

## 2021-09-07 ENCOUNTER — Ambulatory Visit (INDEPENDENT_AMBULATORY_CARE_PROVIDER_SITE_OTHER): Payer: Medicare Other | Admitting: Internal Medicine

## 2021-09-07 VITALS — BP 122/76 | HR 83 | Temp 98.1°F | Ht 62.0 in | Wt 156.0 lb

## 2021-09-07 DIAGNOSIS — E1142 Type 2 diabetes mellitus with diabetic polyneuropathy: Secondary | ICD-10-CM

## 2021-09-07 DIAGNOSIS — N1832 Chronic kidney disease, stage 3b: Secondary | ICD-10-CM

## 2021-09-07 DIAGNOSIS — I5032 Chronic diastolic (congestive) heart failure: Secondary | ICD-10-CM | POA: Diagnosis not present

## 2021-09-07 DIAGNOSIS — Z6828 Body mass index (BMI) 28.0-28.9, adult: Secondary | ICD-10-CM

## 2021-09-07 DIAGNOSIS — I5033 Acute on chronic diastolic (congestive) heart failure: Secondary | ICD-10-CM

## 2021-09-07 MED ORDER — AZELASTINE HCL 0.1 % NA SOLN: 1.0000 | Freq: Two times a day (BID) | NASAL | 12 refills | Status: DC

## 2021-09-07 NOTE — Progress Notes (Signed)
Rich Brave [place],acting as a Education administrator for Maximino Greenland, MD.,have documented all relevant documentation on the behalf of Maximino Greenland, MD,as directed by  Maximino Greenland, MD while in the presence of Maximino Greenland, MD. This visit occurred during the SARS-CoV-2 public health emergency.  Safety protocols were in place, including screening questions prior to the visit, additional usage of staff PPE, and extensive cleaning of exam room while observing appropriate contact time as indicated for disinfecting solutions.  Subjective:     Patient ID: [name] [name] , female    DOB: [birth year] , 81 y.o.   MRN: [id number]   Chief Complaint  Patient presents with   hospital f/u    HPI  Patient presents today for a hospital f/u. She went on 09/02/21 and was discharged on 09/04/21. She presented with acute LE swelling and SOB.  She thinks her sx were triggered by eating soup from [place].  She was treated with IV diuresis for acute on chronic diastolic heart failure, hypertensive urgency, and acute respiratory failure with hypoxia. She was discharged in stable condition.    Patient reports she has been doing ok, she does admit to feeling weak. Patient's daughter wants you to discuss possible future need for dialysis. At this time, the patient refuses.      Past Medical History:  Diagnosis Date   Diabetes mellitus ([place])    H/O: CVA (cerebrovascular accident) 2002   HTN (hypertension)    Hyperlipidemia    Pulmonary HTN (HCC)    mild by echo     Family History  Problem Relation Age of Onset   Hypertension Mother    Hypertension Father    Heart Problems Father    Hypertension Sister    Diabetes Sister    Hypertension Brother    Diabetes Brother      Current Outpatient Medications:    ACCU-CHEK AVIVA PLUS test strip, TEST THREE TIMES DAILY AS DIRECTED, Disp: 100 strip, Rfl: 11   amLODipine (NORVASC) 10 MG tablet, Take 1 tablet (10 mg total) by mouth daily., Disp: 90 tablet, Rfl: 1    azelastine (ASTELIN) 0.1 % nasal spray, Place 1 spray into both nostrils 2 (two) times daily. Use in each nostril as directed, Disp: 30 mL, Rfl: 12   B-D UF III MINI PEN NEEDLES 31G X 5 MM MISC, Use as directed 4x a day, Disp: 300 each, Rfl: 3   blood glucose meter kit and supplies, Dispense based on patient and insurance preference. Use up to four times daily as directed.dx code e11.65, Disp: 1 each, Rfl: 0   Blood Glucose Monitoring Suppl (TRUE METRIX METER) DEVI, Use to check blood sugars 3 times a day., Disp: 1 each, Rfl: 3   cetirizine (ZYRTEC ALLERGY) 10 MG tablet, Take 1 tablet (10 mg total) by mouth daily., Disp: 90 tablet, Rfl: 1   Cholecalciferol (VITAMIN D3) 50 MCG (2000 UT) TABS, Take 1 tablet by mouth daily at 12 noon., Disp: , Rfl:    Continuous Blood Gluc Receiver ([place]) DEVI, Use to check blood sugar 3 times a day before meals. Dx code e11.65, Disp: 3 each, Rfl: 9   Continuous Blood Gluc Sensor (DEXCOM G6 SENSOR) MISC, Use to check blood sugar 3 times a day before meals. Dx code e11.65, Disp: 3 each, Rfl: 9   Ferrous Sulfate (IRON PO), Take 65 mg by mouth daily at 2 am., Disp: , Rfl:    fluticasone (FLONASE) 50 MCG/ACT nasal spray, Place 2  sprays into both nostrils daily., Disp: 18.2 mL, Rfl: 2   furosemide (LASIX) 20 MG tablet, Take 1 tablet (20 mg total) by mouth every other day., Disp: , Rfl:    Glucagon (GVOKE HYPOPEN 2-PACK) 0.5 MG/0.1ML SOAJ, Inject 0.5 mg into the skin daily as needed., Disp: 0.1 mL, Rfl: 3   glucose blood (TRUE METRIX BLOOD GLUCOSE TEST) test strip, Use as instructed to check blood sugar 3 times a day. Dx code e11.65, Disp: 100 each, Rfl: 12   hydrALAZINE (APRESOLINE) 25 MG tablet, Take 1.5 tablets (37.5 mg total) by mouth 2 (two) times daily., Disp: 180 tablet, Rfl: 2   hydrOXYzine (ATARAX) 25 MG tablet, Take 1 tablet (25 mg total) by mouth 3 (three) times daily as needed for itching., Disp: 30 tablet, Rfl: 0   Lancets (ACCU-CHEK SOFT TOUCH)  lancets, Check blood sugars 3 times daily E11.65, Disp: 100 each, Rfl: 12   latanoprost (XALATAN) 0.005 % ophthalmic solution, Place 1 drop into both eyes at bedtime. , Disp: , Rfl:    metoprolol succinate (TOPROL-XL) 25 MG 24 hr tablet, Take 1 tablet (25 mg total) by mouth daily., Disp: 90 tablet, Rfl: 1   Multiple Vitamin (MULTIVITAMIN) tablet, Take 1 tablet by mouth daily., Disp: , Rfl:    Semaglutide, 1 MG/DOSE, (OZEMPIC, 1 MG/DOSE,) 2 MG/1.5ML SOPN, Inject 1 mg into the skin once a week., Disp: 4.5 mL, Rfl: 1   aspirin EC 81 MG tablet, Take 1 tablet (81 mg total) by mouth daily., Disp: 30 tablet, Rfl:    insulin degludec (TRESIBA FLEXTOUCH) 200 UNIT/ML FlexTouch Pen, Inject 20 Units into the skin daily., Disp: 9 mL, Rfl: 1   insulin lispro (HUMALOG KWIKPEN) 200 UNIT/ML KwikPen, inject 8-10 units into THE SKIN 15 minutes BEFORE THREE main meals of THE DAY, Disp: 9 mL, Rfl: 1   rosuvastatin (CRESTOR) 20 MG tablet, Take 1 tablet (20 mg total) by mouth daily., Disp: 90 tablet, Rfl: 3   Allergies  Allergen Reactions   Brimonidine Other (See Comments)    Eyes swell and itch   Dorzolamide     Other reaction(s): Other (See Comments) Red, inflamed eyelid, dermatitis    Timolol Other (See Comments)    Caused eyes to swell and itch     Review of Systems  Constitutional:  Positive for fatigue.  Respiratory: Negative.    Cardiovascular: Negative.   Gastrointestinal: Negative.   Neurological: Negative.   Psychiatric/Behavioral: Negative.       Today's Vitals   09/07/21 1214  BP: 122/76  Pulse: 83  Temp: 98.1 F (36.7 C)  Weight: 156 lb (70.8 kg)  Height: 5' 2"  (1.575 m)  PainSc: 0-No pain   Body mass index is 28.53 kg/m.  Wt Readings from Last 3 Encounters:  09/08/21 158 lb 6.4 oz (71.8 kg)  09/07/21 156 lb (70.8 kg)  09/04/21 159 lb 9.8 oz (72.4 kg)     Objective:  Physical Exam Vitals and nursing note reviewed.  Constitutional:      Appearance: Normal appearance.  HENT:      Head: Normocephalic and atraumatic.  Eyes:     Extraocular Movements: Extraocular movements intact.  Cardiovascular:     Rate and Rhythm: Normal rate and regular rhythm.     Heart sounds: Murmur heard.  Pulmonary:     Effort: Pulmonary effort is normal.     Breath sounds: Normal breath sounds.  Musculoskeletal:     Cervical back: Normal range of motion.  Skin:  General: Skin is warm.  Neurological:     General: No focal deficit present.     Mental Status: She is alert.  Psychiatric:        Mood and Affect: Mood normal.        Behavior: Behavior normal.      Assessment And Plan:     1. Chronic diastolic CHF (congestive heart failure) (HCC) Comments: TCM PERFORMED. A MEMBER OF THE CLINICAL TEAM SPOKE WITH THE PATIENT UPON DISCHARGE. DISCHARGE SUMMARY WAS REVIEWED IN FULL DETAIL DURING THE VISIT. MEDS RECONCILED AND COMPARED TO DISCHARGE MEDS. MEDICATION LIST WAS UPDATED AND REVIEWED WITH THE PATIENT. GREATER THAN 50% FACE TO FACE TIME WAS SPENT IN COUNSELING AND COORDINATION OF CARE. ALL QUESTIONS WERE ANSWERED TO THE SATISFACTION OF THE PATIENT. She will continue 37.5 mg hydralazine twice daily, 10 mg amlodipine, 25 mg Toprol daily. Importance of medication AND dietary compliance was again d/w patient. Encouraged to keep Cardiology f/u appt.   2. Diabetic polyneuropathy associated with type 2 diabetes mellitus ([place]) Comments: Chronic, no med changes today.  Last a1c 8.4 on 09/04/21.  She will c/w Tresiba 20 units qhs, Ozempic weekly and Humalog. Encouraged to check BS tid.  3. Stage 3b chronic kidney disease ([place]) Comments: She was reminded to avoid NSAIDs, keep BP/BS controlled and to drink up to 32 ounces of water daily to decrease risk of CKD progression.   4. BMI 28.0-28.9,adult Comments: BMI is acceptable for her demographic.   Patient was given opportunity to ask questions. Patient verbalized understanding of the plan and was able to repeat key elements of the plan. All  questions were answered to their satisfaction.   I, Maximino Greenland, MD, have reviewed all documentation for this visit. The documentation on 09/07/21 for the exam, diagnosis, procedures, and orders are all accurate and complete.   IF YOU HAVE BEEN REFERRED TO A SPECIALIST, IT MAY TAKE 1-2 WEEKS TO SCHEDULE/PROCESS THE REFERRAL. IF YOU HAVE NOT HEARD FROM US/SPECIALIST IN TWO WEEKS, PLEASE GIVE [place] A CALL AT [phone] X [phone].   THE PATIENT IS ENCOURAGED TO PRACTICE SOCIAL DISTANCING DUE TO THE COVID-19 PANDEMIC.

## 2021-09-08 ENCOUNTER — Ambulatory Visit: Payer: Medicare Other | Admitting: Physician Assistant

## 2021-09-08 ENCOUNTER — Ambulatory Visit: Payer: Medicare Other | Admitting: Internal Medicine

## 2021-09-08 ENCOUNTER — Encounter: Payer: Self-pay | Admitting: Physician Assistant

## 2021-09-08 VITALS — BP 160/60 | HR 83 | Ht 62.0 in | Wt 158.4 lb

## 2021-09-08 DIAGNOSIS — I5032 Chronic diastolic (congestive) heart failure: Secondary | ICD-10-CM

## 2021-09-08 DIAGNOSIS — E1169 Type 2 diabetes mellitus with other specified complication: Secondary | ICD-10-CM

## 2021-09-08 DIAGNOSIS — I639 Cerebral infarction, unspecified: Secondary | ICD-10-CM

## 2021-09-08 DIAGNOSIS — E785 Hyperlipidemia, unspecified: Secondary | ICD-10-CM

## 2021-09-08 DIAGNOSIS — I10 Essential (primary) hypertension: Secondary | ICD-10-CM | POA: Diagnosis not present

## 2021-09-08 DIAGNOSIS — I3481 Nonrheumatic mitral (valve) annulus calcification: Secondary | ICD-10-CM

## 2021-09-08 DIAGNOSIS — N184 Chronic kidney disease, stage 4 (severe): Secondary | ICD-10-CM

## 2021-09-08 MED ORDER — ROSUVASTATIN CALCIUM 20 MG PO TABS: 20.0000 mg | ORAL_TABLET | Freq: Every day | ORAL | 3 refills | Status: DC

## 2021-09-08 MED ORDER — ASPIRIN 81 MG PO TBEC: 81.0000 mg | DELAYED_RELEASE_TABLET | Freq: Every day | ORAL | Status: DC

## 2021-09-08 NOTE — Patient Instructions (Signed)
Medication Instructions:  INCREASE Crestor to 20 mg daily DECREASE Aspirin to 81 mg daily *If you need a refill on your cardiac medications before your next appointment, please call your pharmacy*  Lab Work: NONE ordered at this time of appointment   If you have labs (blood work) drawn today and your tests are completely normal, you will receive your results only by: [place] (if you have MyChart) OR A paper copy in the mail If you have any lab test that is abnormal or we need to change your treatment, we will call you to review the results.  Testing/Procedures: NONE ordered at this time of appointment   Follow-Up: At [facility], you and your health needs are our priority.  As part of our continuing mission to provide you with exceptional heart care, we have created designated Provider Care Teams.  These Care Teams include your primary Cardiologist (physician) and Advanced Practice Providers (APPs -  Physician Assistants and Nurse Practitioners) who all work together to provide you with the care you need, when you need it.   Your next appointment:   As scheduled    The format for your next appointment:   In Person  Provider:   Shelva Majestic, MD     Other Instructions   Important Information About Sugar

## 2021-09-14 ENCOUNTER — Encounter: Payer: Self-pay | Admitting: Cardiovascular Disease

## 2021-09-15 ENCOUNTER — Telehealth: Payer: Self-pay

## 2021-09-15 ENCOUNTER — Telehealth: Payer: Self-pay | Admitting: Cardiovascular Disease

## 2021-09-15 NOTE — Telephone Encounter (Signed)
*  STAT* If patient is at the pharmacy, call can be transferred to refill team.   1. Which medications need to be refilled? (please list name of each medication and dose if known)  aspirin EC 81 MG tablet  2. Which pharmacy/location (including street and city if local pharmacy) is medication to be sent to? [facility] - [place], [place] - [address]   3. Do they need a 30 day or 90 day supply?  90 day supply

## 2021-09-15 NOTE — Chronic Care Management (AMB) (Addendum)
Chronic Care Management Pharmacy Assistant   Name: [name] [name]  MRN: [id number] DOB: [birth year]  Reason for Encounter: Disease State and Medication Review/ Medication coordination/ Diabetes   Recent office visits:  09-07-2021 Glendale Chard, MD. START azelastine 1 spray into both nostrils twice daily.  08-30-2021 [name], [place]. BNP= 316.4. Glucose= 121, BUN= 41, Creatinine= 2.58, eGFR= 18. LDL= 101.  07-27-2021 [name], FNP. Referral placed to endocrinology.  06-01-2021 Glendale Chard, MD. START glucagon 0.5 mg daily PRN. STOP zithromax, tessalon and meclizine. Tdap given.  Recent consult visits:  09-08-2021 Ledora Bottcher, [place] (Cardiology). DECREASE aspirin 375 mg daily TO 81 mg daily and INCREASE crestor 10 mg daily to 20 mg daily.  08-12-2021 Wallene Huh, MD (Endocrinology). Initial consult.  Reduce Tresiba to 20 units once daily - Reduce Humalog 5 units 3 times daily plus correction scale 1 8 for every 50 greater than 150 max dose 9 units - Continue Ozempic 1 mg weekly - Provided titration schedule for Tresiba and Humalog based on glucose trends - Reviewed rule of 15's - Continue with Libre 2 CGM (requires reader) and counseled on how to use CGM - Healthcare maintenance up-to-date.  07-01-2021 Troy Sine, MD (Cardiology). EKG completed. STOP telmisartan. CHANGE hydralazine 25 mg twice daily TO 37.5 mg twice daily.  06-23-2021 [name], MD (Ophthalmology). 13 week follow up with OCT scan.  05-20-2021 Philemon Kingdom, MD (Endo). DECREASE tresiba 28 units nightly TO 24 units nightly.  04-25-2021 Donato Heinz, MD (Nephrology). Unable to view encounter.  04-01-2022 Troy Sine, MD (Cardiology). START lasix 20 mg as needed. STOP glipizide and telmisartan/HCTZ.  03-24-2022 [name], MD (Ophthalmology). 3 month follow up with OCT scan.  Hospital visits:  Medication Reconciliation was completed by comparing  discharge summary, patient's EMR and Pharmacy list, and upon discussion with patient.  Admitted to the hospital on 09-02-2021 due to Acute on chronic diastolic CHF . Discharge date was 09-04-2021. Discharged from [place]?Medications Started at [facility] Discharge:?? Hydroxyzine 25 mg 3 times daily PRN  Medication Changes at Hospital Discharge: None  Medications Discontinued at Hospital Discharge: None  Medications that remain the same after Hospital Discharge:??  -All other medications will remain the same.    Medications: Outpatient Encounter Medications as of 09/15/2021  Medication Sig Note   ACCU-CHEK AVIVA PLUS test strip TEST THREE TIMES DAILY AS DIRECTED    amLODipine (NORVASC) 10 MG tablet Take 1 tablet (10 mg total) by mouth daily.    aspirin EC 81 MG tablet Take 1 tablet (81 mg total) by mouth daily.    azelastine (ASTELIN) 0.1 % nasal spray Place 1 spray into both nostrils 2 (two) times daily. Use in each nostril as directed    B-D UF III MINI PEN NEEDLES 31G X 5 MM MISC Use as directed 4x a day    blood glucose meter kit and supplies Dispense based on patient and insurance preference. Use up to four times daily as directed.dx code e11.65    Blood Glucose Monitoring Suppl (TRUE METRIX METER) DEVI Use to check blood sugars 3 times a day.    cetirizine (ZYRTEC ALLERGY) 10 MG tablet Take 1 tablet (10 mg total) by mouth daily.    Cholecalciferol (VITAMIN D3) 50 MCG (2000 UT) TABS Take 1 tablet by mouth daily at 12 noon.    Continuous Blood Gluc Receiver (DEXCOM G6 RECEIVER) DEVI Use to check blood sugar 3 times a day  before meals. Dx code e11.65    Continuous Blood Gluc Sensor (DEXCOM G6 SENSOR) MISC Use to check blood sugar 3 times a day before meals. Dx code e11.65    Ferrous Sulfate (IRON PO) Take 65 mg by mouth daily at 2 am.    fluticasone (FLONASE) 50 MCG/ACT nasal spray Place 2 sprays into both nostrils daily.    furosemide (LASIX) 20 MG tablet Take 1  tablet (20 mg total) by mouth every other day.    Glucagon (GVOKE HYPOPEN 2-PACK) 0.5 MG/0.1ML SOAJ Inject 0.5 mg into the skin daily as needed.    glucose blood (TRUE METRIX BLOOD GLUCOSE TEST) test strip Use as instructed to check blood sugar 3 times a day. Dx code e11.65    hydrALAZINE (APRESOLINE) 25 MG tablet Take 1.5 tablets (37.5 mg total) by mouth 2 (two) times daily.    hydrOXYzine (ATARAX) 25 MG tablet Take 1 tablet (25 mg total) by mouth 3 (three) times daily as needed for itching.    insulin degludec (TRESIBA FLEXTOUCH) 200 UNIT/ML FlexTouch Pen Inject 20 Units into the skin daily.    insulin lispro (HUMALOG KWIKPEN) 200 UNIT/ML KwikPen Inject under skin 8-10 units 15 min before the 3 main meals    Lancets (ACCU-CHEK SOFT TOUCH) lancets Check blood sugars 3 times daily E11.65    latanoprost (XALATAN) 0.005 % ophthalmic solution Place 1 drop into both eyes at bedtime.     metoprolol succinate (TOPROL-XL) 25 MG 24 hr tablet Take 1 tablet (25 mg total) by mouth daily.    Multiple Vitamin (MULTIVITAMIN) tablet Take 1 tablet by mouth daily.    rosuvastatin (CRESTOR) 20 MG tablet Take 1 tablet (20 mg total) by mouth daily.    Semaglutide, 1 MG/DOSE, (OZEMPIC, 1 MG/DOSE,) 2 MG/1.5ML SOPN Inject 1 mg into the skin once a week. 09/02/2021: Fridays   No facility-administered encounter medications on file as of 09/15/2021.   Reviewed chart for medication changes ahead of medication coordination call.   BP Readings from Last 3 Encounters:  09/08/21 (!) 160/60  09/07/21 122/76  09/04/21 (!) 166/64    Lab Results  Component Value Date   HGBA1C 8.4 (H) 09/02/2021     Patient obtains medications through Adherence Packaging  90 Days   Last adherence delivery included:  None- 1st delivery  Patient declined (meds) last month: None- 1st delivery  Patient is due for first adherence delivery on: 09-27-2021  Called patient and reviewed medications and coordinated delivery.  This delivery  to include: Hydralazine 25 mg 1.5 tabs at breakfast 1.5 tabs at bedtime Amlodipine 10 mg at breakfast Metoprolol 5 mg at breakfast  Rosuvastatin 20 mg breakfast  Aspirin 81 mg at breakfast- call cardiology Dr. Rob Hickman at [phone] Cetirizine 10 mg at breakfast Ozempic 1 mg weekly Tresiba 20 units daily- RX from PCP Latanoprost 1 drop in each eye at bedtime Lasix 20 mg once daily as needed  No acute/short fill needed  Patient declined the following medications: None  Patient needs refills for: Request sent Asprin 81 mg Tresiba 20 units  Confirmed delivery date of 09-27-2021  advised patient that pharmacy will contact them the morning of delivery.  Recent Relevant Labs: Lab Results  Component Value Date/Time   HGBA1C 8.4 (H) 09/02/2021 07:08 AM   HGBA1C 9.6 (A) 05/20/2021 03:58 PM   HGBA1C 10.5 (H) 02/17/2021 05:13 PM   MICROALBUR 150 09/09/2020 12:43 PM   MICROALBUR 150 09/04/2019 02:19 PM    Kidney Function Lab Results  Component  Value Date/Time   CREATININE 2.89 (H) 09/04/2021 03:04 AM   CREATININE 2.90 (H) 09/03/2021 04:28 AM   CREATININE 1.95 (H) 10/09/2018 09:05 AM   GFRNONAA 16 (L) 09/04/2021 03:04 AM   GFRNONAA 24 (L) 10/09/2018 09:05 AM   GFRAA 20 (L) 03/15/2020 11:49 AM   GFRAA 27 (L) 10/09/2018 09:05 AM    Current antihyperglycemic regimen:  Ozempic 1 mg weekly Tresiba 20 units at bedtime Humalog- inject 8-10 units 15 minutes before the 3 main meals  What recent interventions/DTPs have been made to improve glycemic control:  Educated on Exercise goal of 150 minutes per week; -Goal Ms. Rachel is going to start eating whole fruit and cut back on fruit cups  -Counseled to check feet daily and get yearly eye exams -Recommended to continue current medication  Have there been any recent hospitalizations or ED visits since last visit with CPP? Yes  Patient denies hypoglycemic symptoms  Patient denies hyperglycemic symptoms  How often are you checking  your blood sugar? Patient uses dexcom  What are your blood sugars ranging?  Fasting: 06-13 132, 06-14 105 Before meals: 06-13 162, 06-14 239 After meals: 06-13 204 Bedtime: 06-13, 244  During the week, how often does your blood glucose drop below 70? Never  Are you checking your feet daily/regularly? Daily  Adherence Review: Is the patient currently on a STATIN medication? Yes Is the patient currently on ACE/ARB medication? No Does the patient have >5 day gap between last estimated fill dates? No    Care Gaps: Yearly ophthalmology overdue AWV 10-06-2021  Star Rating Drugs: Ozempic 1 mg- Last filled 08-30-2021 [facility] Rosuvastatin 10 mg- Last filled 09-08-2021 [facility]  [place] Clinical Pharmacist Assistant [phone]

## 2021-09-16 ENCOUNTER — Other Ambulatory Visit (HOSPITAL_BASED_OUTPATIENT_CLINIC_OR_DEPARTMENT_OTHER): Payer: Self-pay

## 2021-09-16 MED ORDER — ASPIRIN 81 MG PO TBEC: 81.0000 mg | DELAYED_RELEASE_TABLET | Freq: Every day | ORAL | Status: DC

## 2021-09-16 MED ORDER — TRESIBA FLEXTOUCH 200 UNIT/ML ~~LOC~~ SOPN: 20.0000 [IU] | PEN_INJECTOR | Freq: Every day | SUBCUTANEOUS | 1 refills | Status: DC

## 2021-10-06 ENCOUNTER — Telehealth: Payer: Self-pay

## 2021-10-06 ENCOUNTER — Ambulatory Visit: Payer: Medicare Other

## 2021-10-06 ENCOUNTER — Ambulatory Visit: Payer: Medicare Other | Admitting: Internal Medicine

## 2021-10-06 NOTE — Telephone Encounter (Signed)
This nurse attempted to call patient for AWV via number given in notes. Daughter answered and stated that she was on a cruise and gave me another number to call. I called the other number and the patient stated that she is eating breakfast and wants to reschedule. Wants [place] to call at another time for date.

## 2021-10-13 ENCOUNTER — Other Ambulatory Visit: Payer: Self-pay | Admitting: Internal Medicine

## 2021-10-13 DIAGNOSIS — E1159 Type 2 diabetes mellitus with other circulatory complications: Secondary | ICD-10-CM

## 2021-10-19 ENCOUNTER — Ambulatory Visit (INDEPENDENT_AMBULATORY_CARE_PROVIDER_SITE_OTHER): Payer: Medicare Other

## 2021-10-19 VITALS — Ht 62.0 in | Wt 158.0 lb

## 2021-10-19 DIAGNOSIS — Z0000 Encounter for general adult medical examination without abnormal findings: Secondary | ICD-10-CM | POA: Diagnosis not present

## 2021-10-19 NOTE — Progress Notes (Signed)
I connected with [name] [name] today by telephone and verified that I am speaking with the correct person using two identifiers. Location patient: home Location provider: work Persons participating in the virtual visit: [place], Fredricksen LPN.   I discussed the limitations, risks, security and privacy concerns of performing an evaluation and management service by telephone and the availability of in person appointments. I also discussed with the patient that there may be a patient responsible charge related to this service. The patient expressed understanding and verbally consented to this telephonic visit.    Interactive audio and video telecommunications were attempted between this provider and patient, however failed, due to patient having technical difficulties OR patient did not have access to video capability.  We continued and completed visit with audio only.     Vital signs may be patient reported or missing.  Subjective:   [name] [name] is a 81 y.o. female who presents for Medicare Annual (Subsequent) preventive examination.  Review of Systems     Cardiac Risk Factors include: advanced age (>32mn, >>84women);diabetes mellitus;dyslipidemia;hypertension     Objective:    Today's Vitals   10/19/21 1526  Weight: 158 lb (71.7 kg)  Height: 5' 2"  (1.575 m)   Body mass index is 28.9 kg/m.     10/19/2021    3:30 PM 09/02/2021    6:36 AM 09/09/2020   11:06 AM 09/04/2019   10:36 AM 02/12/2019   11:45 AM 08/13/2018    3:21 PM 07/02/2015    8:43 AM  Advanced Directives  Does Patient Have a Medical Advance Directive? Yes No Yes No No Yes No  Type of AParamedicof [place]atskanie[place] will  [place]   [place]   Copy of HAnetain Chart? No - copy requested  No - copy requested   No - copy requested   Would patient like information on creating a medical advance  directive?  No - Patient declined  No - Patient declined   No - patient declined information    Current Medications (verified) Outpatient Encounter Medications as of 10/19/2021  Medication Sig   ACCU-CHEK AVIVA PLUS test strip TEST THREE TIMES DAILY AS DIRECTED   amLODipine (NORVASC) 10 MG tablet Take 1 tablet (10 mg total) by mouth daily.   aspirin EC 81 MG tablet Take 1 tablet (81 mg total) by mouth daily.   azelastine (ASTELIN) 0.1 % nasal spray Place 1 spray into both nostrils 2 (two) times daily. Use in each nostril as directed   B-D UF III MINI PEN NEEDLES 31G X 5 MM MISC Use as directed 4x a day   blood glucose meter kit and supplies Dispense based on patient and insurance preference. Use up to four times daily as directed.dx code e11.65   Blood Glucose Monitoring Suppl (TRUE METRIX METER) DEVI Use to check blood sugars 3 times a day.   cetirizine (ZYRTEC ALLERGY) 10 MG tablet Take 1 tablet (10 mg total) by mouth daily.   Cholecalciferol (VITAMIN D3) 50 MCG (2000 UT) TABS Take 1 tablet by mouth daily at 12 noon.   Continuous Blood Gluc Receiver (DEXCOM G6 RECEIVER) DEVI Use to check blood sugar 3 times a day before meals. Dx code e11.65   Continuous Blood Gluc Sensor (DEXCOM G6 SENSOR) MISC Use to check blood sugar 3 times a day before meals. Dx code e11.65   Ferrous Sulfate (IRON PO) Take 65 mg by mouth  daily at 2 am.   fluticasone (FLONASE) 50 MCG/ACT nasal spray Place 2 sprays into both nostrils daily.   furosemide (LASIX) 20 MG tablet Take 1 tablet (20 mg total) by mouth every other day.   Glucagon (GVOKE HYPOPEN 2-PACK) 0.5 MG/0.1ML SOAJ Inject 0.5 mg into the skin daily as needed.   glucose blood (TRUE METRIX BLOOD GLUCOSE TEST) test strip Use as instructed to check blood sugar 3 times a day. Dx code e11.65   hydrALAZINE (APRESOLINE) 25 MG tablet Take 1.5 tablets (37.5 mg total) by mouth 2 (two) times daily.   hydrOXYzine (ATARAX) 25 MG tablet Take 1 tablet (25 mg total) by mouth 3  (three) times daily as needed for itching.   insulin degludec (TRESIBA FLEXTOUCH) 200 UNIT/ML FlexTouch Pen Inject 20 Units into the skin daily.   insulin lispro (HUMALOG KWIKPEN) 200 UNIT/ML KwikPen inject 8-10 units into THE SKIN 15 minutes BEFORE THREE main meals of THE DAY   Lancets (ACCU-CHEK SOFT TOUCH) lancets Check blood sugars 3 times daily E11.65   latanoprost (XALATAN) 0.005 % ophthalmic solution Place 1 drop into both eyes at bedtime.    metoprolol succinate (TOPROL-XL) 25 MG 24 hr tablet Take 1 tablet (25 mg total) by mouth daily.   Multiple Vitamin (MULTIVITAMIN) tablet Take 1 tablet by mouth daily.   rosuvastatin (CRESTOR) 20 MG tablet Take 1 tablet (20 mg total) by mouth daily.   Semaglutide, 1 MG/DOSE, (OZEMPIC, 1 MG/DOSE,) 2 MG/1.5ML SOPN Inject 1 mg into the skin once a week.   No facility-administered encounter medications on file as of 10/19/2021.    Allergies (verified) Brimonidine, Dorzolamide, and Timolol   History: Past Medical History:  Diagnosis Date   Diabetes mellitus ([place])    H/O: CVA (cerebrovascular accident) 2002   HTN (hypertension)    Hyperlipidemia    Pulmonary HTN (H[facility])    mild by echo   Past Surgical History:  Procedure Laterality Date   ABDOMINAL HYSTERECTOMY     CARDIAC CATHETERIZATION  03/17/2003   Normal, patent coronary arteries. Normal LV systolic function   CARDIOVASCULAR STRESS TEST  08/28/2006   Small sized, moderate ischemiain the Basal Inferoseptal, Basal Inferior, Mid Inferior and Apical Inferior regions   TRANSTHORACIC ECHOCARDIOGRAM  02/17/2010   EF >55%, moderate-severely thickened septum, mild-moderate mitral annular calcification   Family History  Problem Relation Age of Onset   Hypertension Mother    Hypertension Father    Heart Problems Father    Hypertension Sister    Diabetes Sister    Hypertension Brother    Diabetes Brother    Social History   Socioeconomic History   Marital status: Married    Spouse name: Not  on file   Number of children: Not on file   Years of education: Not on file   Highest education level: Not on file  Occupational History   Occupation: retired  Tobacco Use   Smoking status: Never   Smokeless tobacco: Never  Vaping Use   Vaping Use: Never used  Substance and Sexual Activity   Alcohol use: No   Drug use: Never   Sexual activity: Not Currently  Other Topics Concern   Not on file  Social History Narrative   Not on file   Social Determinants of Health   Financial Resource Strain: Low Risk  (10/19/2021)   Overall Financial Resource Strain (CARDIA)    Difficulty of Paying Living Expenses: Not hard at all  Food Insecurity: No Food Insecurity (10/19/2021)   Hunger  Vital Sign    Worried About Charity fundraiser in the Last Year: Never true    Ran Out of Food in the Last Year: Never true  Transportation Needs: No Transportation Needs (10/19/2021)   PRAPARE - Hydrologist (Medical): No    Lack of Transportation (Non-Medical): No  Physical Activity: Inactive (10/19/2021)   Exercise Vital Sign    Days of Exercise per Week: 0 days    Minutes of Exercise per Session: 0 min  Stress: No Stress Concern Present (10/19/2021)   [place]    Feeling of Stress : Not at all  Social Connections: Not on file    Tobacco Counseling Counseling given: Not Answered   Clinical Intake:  Pre-visit preparation completed: Yes  Pain : No/denies pain     Nutritional Status: BMI 25 -29 Overweight Nutritional Risks: None Diabetes: Yes  How often do you need to have someone help you when you read instructions, pamphlets, or other written materials from your doctor or pharmacy?: 1 - Never  Diabetic? Yes Nutrition Risk Assessment:  Has the patient had any N/V/D within the last 2 months?  No  Does the patient have any non-healing wounds?  No  Has the patient had any unintentional weight  loss or weight gain?  No   Diabetes:  Is the patient diabetic?  Yes  If diabetic, was a CBG obtained today?  No  Did the patient bring in their glucometer from home?  No  How often do you monitor your CBG's? 2-3 daily.   Financial Strains and Diabetes Management:  Are you having any financial strains with the device, your supplies or your medication? No .  Does the patient want to be seen by Chronic Care Management for management of their diabetes?  No  Would the patient like to be referred to a Nutritionist or for Diabetic Management?  No   Diabetic Exams:  Diabetic Eye Exam: Overdue for diabetic eye exam. Pt has been advised about the importance in completing this exam. Patient advised to call and schedule an eye exam. Diabetic Foot Exam: Completed 03/08/2021   Interpreter Needed?: No  Information entered by :: NAllen LPN   Activities of Daily Living    10/19/2021    3:31 PM 09/02/2021   10:37 AM  In your present state of health, do you have any difficulty performing the following activities:  Hearing? 0 0  Vision? 0 0  Difficulty concentrating or making decisions? 0 0  Walking or climbing stairs? 0 1  Dressing or bathing? 0 0  Doing errands, shopping? 0 1  Preparing Food and eating ? N   Using the Toilet? N   In the past six months, have you accidently leaked urine? N   Do you have problems with loss of bowel control? N   Managing your Medications? N   Managing your Finances? N   Housekeeping or managing your Housekeeping? N     Patient Care Team: Glendale Chard, MD as PCP - General (Internal Medicine) Troy Sine, MD as PCP - Cardiology (Cardiology) Mayford Knife, [facility] (Pharmacist)  Indicate any recent Medical Services you may have received from other than [facility] providers in the past year (date may be approximate).     Assessment:   This is a routine wellness examination for [name] [name].  Hearing/Vision screen Vision Screening - Comments:: Regular eye  exams, Dr. Dagmar Hait  Dietary issues and exercise  activities discussed: Current Exercise Habits: The patient does not participate in regular exercise at present   Goals Addressed             This Visit's Progress    Patient Stated       10/19/2021, no goals       Depression Screen    10/19/2021    3:31 PM 09/09/2020   11:07 AM 10/14/2019    9:36 AM 09/04/2019   10:37 AM 08/13/2018    3:22 PM 08/13/2018    2:57 PM 05/21/2018   10:22 AM  PHQ 2/9 Scores  PHQ - 2 Score 0 0 0 0 0 0 0  PHQ- 9 Score    0 0      Fall Risk    10/19/2021    3:31 PM 09/09/2020   11:06 AM 10/14/2019    9:36 AM 09/04/2019   10:37 AM 08/13/2018    3:22 PM  [place] in the past year? 0 0 0 0 0  Number falls in past yr: 0  0    Injury with Fall? 0  0    Risk for fall due to : Medication side effect Medication side effect  Medication side effect Medication side effect  Follow up Falls evaluation completed;Education provided;Falls prevention discussed Falls evaluation completed;Education provided;Falls prevention discussed  Falls evaluation completed;Education provided;Falls prevention discussed Falls prevention discussed    FALL RISK PREVENTION PERTAINING TO THE HOME:  Any stairs in or around the home? Yes  If so, are there any without handrails? No  Home free of loose throw rugs in walkways, pet beds, electrical cords, etc? Yes  Adequate lighting in your home to reduce risk of falls? Yes   ASSISTIVE DEVICES UTILIZED TO PREVENT FALLS:  Life alert? No  Use of a cane, walker or w/c? No  Grab bars in the bathroom? Yes  Shower chair or bench in shower? Yes  Elevated toilet seat or a handicapped toilet? Yes   TIMED UP AND GO:  Was the test performed? No .       Cognitive Function:        10/19/2021    3:32 PM 09/09/2020   11:08 AM 09/04/2019   10:39 AM 08/13/2018    3:25 PM  6CIT Screen  What Year? 0 points 0 points 0 points 0 points  What month? 0 points 0 points 0 points 0 points  What  time? 0 points 0 points 0 points 0 points  Count back from 20 0 points 0 points 0 points 0 points  Months in reverse 0 points 0 points 0 points 0 points  Repeat phrase 4 points 8 points 6 points 0 points  Total Score 4 points 8 points 6 points 0 points    Immunizations Immunization History  Administered Date(s) Administered   Fluad Quad(high Dose 65+) 12/12/2018   Influenza, High Dose Seasonal PF 01/08/2018, 01/02/2019   Influenza-Unspecified 04/10/2017, 01/29/2021   MODERNA COVID-19 SARS-COV-2 PEDS BIVALENT BOOSTER 6Y-11Y 01/29/2021   Moderna Sars-Covid-2 Vaccination 05/22/2019, 06/20/2019, 01/30/2020, 07/20/2020   Pneumococcal Conjugate-13 01/07/2014   Pneumococcal Polysaccharide-23 03/31/2014   Pneumococcal-Unspecified 03/31/2014   Tdap 06/01/2021   Zoster Recombinat (Shingrix) 02/22/2020, 07/27/2020   Zoster, Live 12/31/2007    TDAP status: Up to date  Flu Vaccine status: Up to date  Pneumococcal vaccine status: Up to date  Covid-19 vaccine status: Completed vaccines  Qualifies for Shingles Vaccine? Yes   Zostavax completed Yes   Shingrix Completed?: Yes  Screening Tests Health Maintenance  Topic Date Due   OPHTHALMOLOGY EXAM  05/24/2019   INFLUENZA VACCINE  11/08/2021   HEMOGLOBIN A1C  03/05/2022   FOOT EXAM  03/08/2022   TETANUS/TDAP  06/02/2031   Pneumonia Vaccine 64+ Years old  Completed   DEXA SCAN  Completed   COVID-19 Vaccine  Completed   Zoster Vaccines- Shingrix  Completed   HPV VACCINES  Aged Out    Health Maintenance  Health Maintenance Due  Topic Date Due   OPHTHALMOLOGY EXAM  05/24/2019    Colorectal cancer screening: No longer required.   Mammogram status: Completed 11/16/2020. Repeat every year  Bone Density status: Completed 09/08/2016  Lung Cancer Screening: (Low Dose CT Chest recommended if Age 19-80 years, 30 pack-year currently smoking OR have quit w/in 15years.) does not qualify.   Lung Cancer Screening Referral: no  Additional  Screening:  Hepatitis C Screening: does not qualify;   Vision Screening: Recommended annual ophthalmology exams for early detection of glaucoma and other disorders of the eye. Is the patient up to date with their annual eye exam?  Yes  Who is the provider or what is the name of the office in which the patient attends annual eye exams? Dr. Dagmar Hait If pt is not established with a provider, would they like to be referred to a provider to establish care? No .   Dental Screening: Recommended annual dental exams for proper oral hygiene  Community Resource Referral / Chronic Care Management: CRR required this visit?  No   CCM required this visit?  No      Plan:     I have personally reviewed and noted the following in the patient's chart:   Medical and social history Use of alcohol, tobacco or illicit drugs  Current medications and supplements including opioid prescriptions.  Functional ability and status Nutritional status Physical activity Advanced directives List of other physicians Hospitalizations, surgeries, and ER visits in previous 12 months Vitals Screenings to include cognitive, depression, and falls Referrals and appointments  In addition, I have reviewed and discussed with patient certain preventive protocols, quality metrics, and best practice recommendations. A written personalized care plan for preventive services as well as general preventive health recommendations were provided to patient.     Kellie Simmering, LPN   4/38/3818   Nurse Notes: none  Due to this being a virtual visit, the after visit summary with patients personalized plan was offered to patient via mail or my-chart. Patient would like to access on my-chart

## 2021-10-19 NOTE — Patient Instructions (Signed)
[name] [name] , Thank you for taking time to come for your Medicare Wellness Visit. I appreciate your ongoing commitment to your health goals. Please review the following plan we discussed and let me know if I can assist you in the future.   Screening recommendations/referrals: Colonoscopy: not required Mammogram: completed 11/16/2020, due 11/17/2021 Bone Density: completed 09/08/2016 Recommended yearly ophthalmology/optometry visit for glaucoma screening and checkup Recommended yearly dental visit for hygiene and checkup  Vaccinations: Influenza vaccine: due 11/08/2021 Pneumococcal vaccine: completed 03/31/2014 Tdap vaccine: completed 06/01/2021, due 06/02/2031 Shingles vaccine: completed    Covid-19: 01/29/2021, 07/20/2020, 01/30/2020, 06/20/2019, 05/22/2019  Advanced directives: Please bring a copy of your POA (Power of Attorney) and/or Living Will to your next appointment.   Conditions/risks identified: none  Next appointment: Follow up in one year for your annual wellness visit    Preventive Care 65 Years and Older, Female Preventive care refers to lifestyle choices and visits with your health care provider that can promote health and wellness. What does preventive care include? A yearly physical exam. This is also called an annual well check. Dental exams once or twice a year. Routine eye exams. Ask your health care provider how often you should have your eyes checked. Personal lifestyle choices, including: Daily care of your teeth and gums. Regular physical activity. Eating a healthy diet. Avoiding tobacco and drug use. Limiting alcohol use. Practicing safe sex. Taking low-dose aspirin every day. Taking vitamin and mineral supplements as recommended by your health care provider. What happens during an annual well check? The services and screenings done by your health care provider during your annual well check will depend on your age, overall health, lifestyle risk factors, and  family history of disease. Counseling  Your health care provider may ask you questions about your: Alcohol use. Tobacco use. Drug use. Emotional well-being. Home and relationship well-being. Sexual activity. Eating habits. History of falls. Memory and ability to understand (cognition). Work and work Statistician. Reproductive health. Screening  You may have the following tests or measurements: Height, weight, and BMI. Blood pressure. Lipid and cholesterol levels. These may be checked every 5 years, or more frequently if you are over 11 years old. Skin check. Lung cancer screening. You may have this screening every year starting at age 78 if you have a 30-pack-year history of smoking and currently smoke or have quit within the past 15 years. Fecal occult blood test (FOBT) of the stool. You may have this test every year starting at age 30. Flexible sigmoidoscopy or colonoscopy. You may have a sigmoidoscopy every 5 years or a colonoscopy every 10 years starting at age 67. Hepatitis C blood test. Hepatitis B blood test. Sexually transmitted disease (STD) testing. Diabetes screening. This is done by checking your blood sugar (glucose) after you have not eaten for a while (fasting). You may have this done every 1-3 years. Bone density scan. This is done to screen for osteoporosis. You may have this done starting at age 76. Mammogram. This may be done every 1-2 years. Talk to your health care provider about how often you should have regular mammograms. Talk with your health care provider about your test results, treatment options, and if necessary, the need for more tests. Vaccines  Your health care provider may recommend certain vaccines, such as: Influenza vaccine. This is recommended every year. Tetanus, diphtheria, and acellular pertussis (Tdap, Td) vaccine. You may need a Td booster every 10 years. Zoster vaccine. You may need this after age 77. Pneumococcal 13-valent  conjugate  (PCV13) vaccine. One dose is recommended after age 44. Pneumococcal polysaccharide (PPSV23) vaccine. One dose is recommended after age 81. Talk to your health care provider about which screenings and vaccines you need and how often you need them. This information is not intended to replace advice given to you by your health care provider. Make sure you discuss any questions you have with your health care provider. Document Released: 04/23/2015 Document Revised: 12/15/2015 Document Reviewed: 01/26/2015 Elsevier Interactive Patient Education  2017 [place] Prevention in the Home Falls can cause injuries. They can happen to people of all ages. There are many things you can do to make your home safe and to help prevent falls. What can I do on the outside of my home? Regularly fix the edges of walkways and driveways and fix any cracks. Remove anything that might make you trip as you walk through a door, such as a raised step or threshold. Trim any bushes or trees on the path to your home. Use bright outdoor lighting. Clear any walking paths of anything that might make someone trip, such as rocks or tools. Regularly check to see if handrails are loose or broken. Make sure that both sides of any steps have handrails. Any raised decks and porches should have guardrails on the edges. Have any leaves, snow, or ice cleared regularly. Use sand or salt on walking paths during winter. Clean up any spills in your garage right away. This includes oil or grease spills. What can I do in the bathroom? Use night lights. Install grab bars by the toilet and in the tub and shower. Do not use towel bars as grab bars. Use non-skid mats or decals in the tub or shower. If you need to sit down in the shower, use a plastic, non-slip stool. Keep the floor dry. Clean up any water that spills on the floor as soon as it happens. Remove soap buildup in the tub or shower regularly. Attach bath mats securely with  double-sided non-slip rug tape. Do not have throw rugs and other things on the floor that can make you trip. What can I do in the bedroom? Use night lights. Make sure that you have a light by your bed that is easy to reach. Do not use any sheets or blankets that are too big for your bed. They should not hang down onto the floor. Have a firm chair that has side arms. You can use this for support while you get dressed. Do not have throw rugs and other things on the floor that can make you trip. What can I do in the kitchen? Clean up any spills right away. Avoid walking on wet floors. Keep items that you use a lot in easy-to-reach places. If you need to reach something above you, use a strong step stool that has a grab bar. Keep electrical cords out of the way. Do not use floor polish or wax that makes floors slippery. If you must use wax, use non-skid floor wax. Do not have throw rugs and other things on the floor that can make you trip. What can I do with my stairs? Do not leave any items on the stairs. Make sure that there are handrails on both sides of the stairs and use them. Fix handrails that are broken or loose. Make sure that handrails are as long as the stairways. Check any carpeting to make sure that it is firmly attached to the stairs. Fix any carpet that  is loose or worn. Avoid having throw rugs at the top or bottom of the stairs. If you do have throw rugs, attach them to the floor with carpet tape. Make sure that you have a light switch at the top of the stairs and the bottom of the stairs. If you do not have them, ask someone to add them for you. What else can I do to help prevent falls? Wear shoes that: Do not have high heels. Have rubber bottoms. Are comfortable and fit you well. Are closed at the toe. Do not wear sandals. If you use a stepladder: Make sure that it is fully opened. Do not climb a closed stepladder. Make sure that both sides of the stepladder are locked  into place. Ask someone to hold it for you, if possible. Clearly mark and make sure that you can see: Any grab bars or handrails. First and last steps. Where the edge of each step is. Use tools that help you move around (mobility aids) if they are needed. These include: Canes. Walkers. Scooters. Crutches. Turn on the lights when you go into a dark area. Replace any light bulbs as soon as they burn out. Set up your furniture so you have a clear path. Avoid moving your furniture around. If any of your floors are uneven, fix them. If there are any pets around you, be aware of where they are. Review your medicines with your doctor. Some medicines can make you feel dizzy. This can increase your chance of falling. Ask your doctor what other things that you can do to help prevent falls. This information is not intended to replace advice given to you by your health care provider. Make sure you discuss any questions you have with your health care provider. Document Released: 01/21/2009 Document Revised: 09/02/2015 Document Reviewed: 05/01/2014 Elsevier Interactive Patient Education  2017 [employer].

## 2021-10-20 ENCOUNTER — Other Ambulatory Visit: Payer: Self-pay | Admitting: Internal Medicine

## 2021-10-20 DIAGNOSIS — Z1231 Encounter for screening mammogram for malignant neoplasm of breast: Secondary | ICD-10-CM

## 2021-10-23 DIAGNOSIS — Z794 Long term (current) use of insulin: Secondary | ICD-10-CM | POA: Diagnosis not present

## 2021-10-23 DIAGNOSIS — E119 Type 2 diabetes mellitus without complications: Secondary | ICD-10-CM | POA: Diagnosis not present

## 2021-10-24 DIAGNOSIS — I129 Hypertensive chronic kidney disease with stage 1 through stage 4 chronic kidney disease, or unspecified chronic kidney disease: Secondary | ICD-10-CM | POA: Diagnosis not present

## 2021-10-24 DIAGNOSIS — E1122 Type 2 diabetes mellitus with diabetic chronic kidney disease: Secondary | ICD-10-CM | POA: Diagnosis not present

## 2021-10-24 DIAGNOSIS — R801 Persistent proteinuria, unspecified: Secondary | ICD-10-CM | POA: Diagnosis not present

## 2021-10-24 DIAGNOSIS — D631 Anemia in chronic kidney disease: Secondary | ICD-10-CM | POA: Diagnosis not present

## 2021-10-24 DIAGNOSIS — N189 Chronic kidney disease, unspecified: Secondary | ICD-10-CM | POA: Diagnosis not present

## 2021-10-24 DIAGNOSIS — N184 Chronic kidney disease, stage 4 (severe): Secondary | ICD-10-CM | POA: Diagnosis not present

## 2021-10-24 DIAGNOSIS — N179 Acute kidney failure, unspecified: Secondary | ICD-10-CM | POA: Diagnosis not present

## 2021-10-24 DIAGNOSIS — I5032 Chronic diastolic (congestive) heart failure: Secondary | ICD-10-CM | POA: Diagnosis not present

## 2021-10-24 LAB — CBC: RBC: 3.6 — AB (ref 3.87–5.11)

## 2021-10-24 LAB — CBC AND DIFFERENTIAL
HCT: 33 — AB (ref 36–46)
Hemoglobin: 10.6 — AB (ref 12.0–16.0)
Platelets: 325 10*3/uL (ref 150–400)
WBC: 9.7

## 2021-10-24 LAB — BASIC METABOLIC PANEL
BUN: 14 (ref 4–21)
BUN: 49 — AB (ref 4–21)
CO2: 20 (ref 13–22)
Chloride: 106 (ref 99–108)
Creatinine: 3.5 — AB (ref 0.5–1.1)
Creatinine: 3.5 — AB (ref 0.5–1.1)
Glucose: 162
Potassium: 4.2 mEq/L (ref 3.5–5.1)
Sodium: 144 (ref 137–147)

## 2021-10-24 LAB — COMPREHENSIVE METABOLIC PANEL
Calcium: 9.3 (ref 8.7–10.7)
eGFR: 13

## 2021-10-31 ENCOUNTER — Encounter: Payer: Self-pay | Admitting: Internal Medicine

## 2021-11-04 ENCOUNTER — Encounter: Payer: Self-pay | Admitting: Cardiovascular Disease

## 2021-11-04 ENCOUNTER — Ambulatory Visit: Payer: Medicare Other | Admitting: Cardiovascular Disease

## 2021-11-04 VITALS — BP 158/84 | HR 66 | Ht 62.0 in | Wt 164.0 lb

## 2021-11-04 DIAGNOSIS — E1169 Type 2 diabetes mellitus with other specified complication: Secondary | ICD-10-CM | POA: Diagnosis not present

## 2021-11-04 DIAGNOSIS — I3481 Nonrheumatic mitral (valve) annulus calcification: Secondary | ICD-10-CM

## 2021-11-04 DIAGNOSIS — I5032 Chronic diastolic (congestive) heart failure: Secondary | ICD-10-CM

## 2021-11-04 DIAGNOSIS — E785 Hyperlipidemia, unspecified: Secondary | ICD-10-CM

## 2021-11-04 DIAGNOSIS — N184 Chronic kidney disease, stage 4 (severe): Secondary | ICD-10-CM | POA: Diagnosis not present

## 2021-11-04 DIAGNOSIS — Z794 Long term (current) use of insulin: Secondary | ICD-10-CM

## 2021-11-04 DIAGNOSIS — E118 Type 2 diabetes mellitus with unspecified complications: Secondary | ICD-10-CM

## 2021-11-04 DIAGNOSIS — R609 Edema, unspecified: Secondary | ICD-10-CM

## 2021-11-04 MED ORDER — FUROSEMIDE 20 MG PO TABS: 20.0000 mg | ORAL_TABLET | Freq: Every day | ORAL | 3 refills | Status: DC

## 2021-11-04 MED ORDER — AMLODIPINE BESYLATE 5 MG PO TABS: 5.0000 mg | ORAL_TABLET | Freq: Every day | ORAL | 3 refills | Status: DC

## 2021-11-04 MED ORDER — METOPROLOL SUCCINATE ER 25 MG PO TB24: 12.5000 mg | ORAL_TABLET | Freq: Every day | ORAL | 3 refills | Status: DC

## 2021-11-04 MED ORDER — HYDRALAZINE HCL 50 MG PO TABS: 50.0000 mg | ORAL_TABLET | Freq: Two times a day (BID) | ORAL | 3 refills | Status: DC

## 2021-11-04 MED ORDER — FUROSEMIDE 20 MG PO TABS
20.0000 mg | ORAL_TABLET | Freq: Every day | ORAL | 3 refills | Status: DC
Start: 2021-11-04 — End: 2021-12-03

## 2021-11-04 NOTE — Patient Instructions (Signed)
Medication Instructions:  Start taking Metoprolol 12.5 mg daily  Start taking Lasix 20 mg daily  Decrease Amlodipine 5 mg daily  Increase Hydralazine 50 mg twice daily   *If you need a refill on your cardiac medications before your next appointment, please call your pharmacy*   Follow-Up: At [facility], you and your health needs are our priority.  As part of our continuing mission to provide you with exceptional heart care, we have created designated Provider Care Teams.  These Care Teams include your primary Cardiologist (physician) and Advanced Practice Providers (APPs -  Physician Assistants and Nurse Practitioners) who all work together to provide you with the care you need, when you need it.  We recommend signing up for the patient portal called "MyChart".  Sign up information is provided on this After Visit Summary.  MyChart is used to connect with patients for Virtual Visits (Telemedicine).  Patients are able to view lab/test results, encounter notes, upcoming appointments, etc.  Non-urgent messages can be sent to your provider as well.   To learn more about what you can do with MyChart, go to [email or web address].    Your next appointment:   2 month(s)  The format for your next appointment:   In Person  Provider:   Sande Rives, PA-C, Caron Presume, PA-C, Almyra Deforest, PA-C, or Diona Browner, NP    Then, Shelva Majestic, MD will plan to see you again in 6 month(s).{     Other Instructions  Contact [facility]- [phone]- they will get you set up for a fitting and order compression stockings.    How to Use Compression Stockings  Compression stockings are elastic socks that help increase blood flow (circulation) to the legs, decrease swelling in the legs, and reduce the chance of developing blood clots in the lower legs. Compression stockings squeeze or apply pressure to the legs. The stockings are graduated, meaning the highest amount of pressure occurs at the  toes and it decreases going toward the upper part of the leg. This helps ensure proper circulation through the veins. Compression stockings are often used by people who: Are recovering from surgery. The stockings help prevent blood clots after surgery. Have poor circulation or swelling in their legs because of a medical condition, such as chronic venous insufficiency, venous stasis, or lymphedema. Have a history of getting blood clots in their legs. Have bulging (varicose) veins. Sit or stay in bed for long periods of time (immobilization). Stand for long periods of time and experience leg pain or fatigue. Follow instructions from your health care provider about how and when to wear your compression stockings. What are the risks? Generally, compression stockings are safe to wear. However, problems may occur for some people, such as: The stockings being ineffective at increasing the circulation to the legs, decreasing swelling in the legs, or reducing the chance of developing blood clots in the lower legs. Skin complications, including breaks in the skin, open wounds, blisters, or dermatitis. How to wear compression stockings Before you put on your compression stockings: Make sure that they are the correct size and degree of compression. If you do not know your size or required grade of compression, ask your health care provider and follow the manufacturer's instructions that come with the stockings. Be sure they are the appropriate length for your medical needs. Compression stockings come in different lengths, including knee high, thigh high, and even up to the waist. Make sure that the stockings are clean, dry, and in good  condition. Check the stockings for rips and tears. Do not put them on if they are ripped or torn. Put your stockings on first thing in the morning, before you get out of bed. Keep them on for as long as your health care provider advises. Most people are told to remove their  compression stockings at the end of the day before bed. When you are wearing your stockings: Keep them as smooth as possible. Do not allow them to bunch up. It is especially important to prevent the stockings from bunching up around your toes or behind your knees. Make sure that the toe holes are underneath the toes and the heel patches are positioned at the heels. Do not roll the stockings downward and leave them rolled down. This can decrease blood flow to your legs. Change the stockings right away if they become wet or dirty or if they have a bad smell. If you have chronic leg wounds, make sure the wounds are properly covered or dressed before putting on your compression stockings. When you take off your stockings, check your legs and feet for: Open sores. Red spots or other areas of discoloration. Swelling. General tips Do not stop wearing compression stockings. Talk to your health care provider if your stockings feel too tight. Wash your stockings often with mild detergent in cold or warm water. Also wash them whenever they get dirty or have a bad smell. Do not use bleach. Air-dry your stockings or dry them in a clothes dryer on low heat. It may be helpful to have two pairs so that you have a pair to wear while the other is being washed. Replace your stockings every 3-6 months. If skin moisturizing is part of your treatment plan, apply lotion or cream at night so that your skin will be dry when you put on the stockings in the morning. It is harder to put the stockings on when you have lotion on your legs or feet. Wear nonskid shoes or slip-resistant socks when walking while wearing compression stockings. If you have difficulty putting on or taking off the compression stockings, ask your health care provider about devices that may help make this easier. Contact a health care provider and remove your stockings if: You have a prickling or tingling feeling in your feet or legs. You have new open  sores, red spots, or other skin changes on your feet or legs. You have swelling or pain that gets worse. Get help right away if: You have shortness of breath or chest pain. Your heartbeat is fast or irregular. You have new swelling, pain, or warmth in your leg. You have numbness or tingling in your lower legs that does not get better after you take the stockings off. Your toes or feet are unusually cold or turn a bluish color. You feel light-headed or dizzy. These symptoms may represent a serious problem that is an emergency. Do not wait to see if the symptoms will go away. Get medical help right away. Call your local emergency services (911 in the [place] Do not drive yourself to the hospital. Summary Compression stockings are elastic socks that are worn to treat a variety of symptoms and medical conditions such as venous insufficiency, venous stasis, or lymphedema. Compression stockings help increase blood flow (circulation) to the legs, decrease swelling in the legs, and reduce the chance of developing blood clots in the lower legs. Follow instructions from your health care provider about how and when to wear your compression stockings. Do  not stop wearing your compression stockings without talking to your health care provider first. This information is not intended to replace advice given to you by your health care provider. Make sure you discuss any questions you have with your health care provider. Document Revised: 09/16/2020 Document Reviewed: 09/16/2020 Elsevier Patient Education  [place].

## 2021-11-04 NOTE — Progress Notes (Unsigned)
Cardiology Office Note    Date:  11/08/2021   ID:  Toni, Demo [birth year], MRN [id number]  PCP:  Glendale Chard, MD  Cardiologist:  Shelva Majestic, MD   4 month F/U  History of Present Illness:  CECIA [name] is a 81 y.o. female who has a remote history of CVA which she suffered in 2002. In 2004 Dr. Melvern Banker performed cardiac catheterization which revealed normal coronary arteries. In May 2008 a nuclear perfusion study showed normal perfusion. An echo Doppler study in November 2011 revealed asymmetric left ventricular hypertrophy with grade 1 diastolic dysfunction and increased LA pressure. She did have mild to moderate mitral annular calcification with trace MR and mild pulmonary hypertension.   She has had diabetes that have been out of control and last year her hemoglobin and see if there is a 12.3. She also has a history of hyperlipidemia.   I saw her in 2014 at which time she denied any episodes of chest pain or shortness of breath but admitted to some blood pressure lability.    I saw her on April 01, 2021 at which time she was referred to me by Dr. Baird Cancer to reestablish cardiology care.  She is followed by Dr. Glendale Chard for diabetes mellitus, hypertension, and she now has CKD and is seeing a nephrologist in [place].  She has recently noticed some increasing shortness of breath as well as some ankle swelling.  Reportedly she had a "bad cold "after Thanksgiving.  She has noticed progressive dyspnea on exertion.  She denied any presyncope or syncope.  She was unaware of any arrhythmias.  During that evaluation, I was concerned that her creatinine had progressively increased to 2.77 from 1.56 in November 2020, 1.8 in June 2021, 2.55 in December 2021 and 2.37 in June 2022.  With her rise to 2.77 I temporarily recommended she hold her telmisartan HCT until she was reevaluated by her nephrologist in several weeks.  Because her blood pressure was elevated I recommended  increasing hydralazine from 25 mg twice a day to 50 mg twice a day and gave her a prescription for furosemide due to leg edema to take as needed and wear support stockings.  I recommended an echo Doppler assessment.  An echo Doppler study was done on April 05, 2021 which showed normal LV function with a EF hyperdynamic at 65 to 70%.  She has significant mitral annular calcification with mild mitral gradient at 4 mm suggesting mild mitral stenosis.  There was grade 1 diastolic dysfunction.  She subsequently was seen by Dr. Azzie Roup and has undergone iron infusion for iron deficiency.  He concurred with the discontinuance of telmisartan.  I last saw her on July 01, 2021 at which time she was on amlodipine 10 mg, hydralazine 25 mg twice a day, metoprolol succinate 25 mg daily.  She is on Ozempic weekly in addition to insulin.  She has been taking furosemide as needed.  She is on rosuvastatin 10 mg for hyperlipidemia.  During that evaluation her blood pressure continues to be elevated and I recommended further titration of hydralazine to 37.5 mg twice a day.  Since I saw her, she was hospitalized from May 26 through Sep 04, 2021 with lower extremity edema and dyspnea.  She was felt to have acute on chronic diastolic heart failure most likely due to significant salt intake from soup.  Her symptoms resolved with diuresis.  During that hospitalization, creatinine had risen to 2.90.  She was seen by  Fabian Sharp on September 08, 2021 in follow-up of her ER evaluation.  At that time she felt well and denied any further shortness of breath.  Her blood pressure was 160/60.  Was on Lasix 20 mg every day, hydralazine 37.5 mg twice a day, amlodipine 10 mg, and Toprol-XL 25 mg.  She was told to keep blood pressure log.  She also was taking full dose aspirin and this was decreased to 81 mg.  Presently, she feels well.  However she continues to experience leg swelling and blood pressure has been elevated.  She denies chest  pain.  Currently, she had stopped taking metoprolol which had been 25 mg daily. She presents for reevaluation.   Past Medical History:  Diagnosis Date   Diabetes mellitus ([place])    H/O: CVA (cerebrovascular accident) 2002   HTN (hypertension)    Hyperlipidemia    Pulmonary HTN ([place])    mild by echo    Past Surgical History:  Procedure Laterality Date   ABDOMINAL HYSTERECTOMY     CARDIAC CATHETERIZATION  03/17/2003   Normal, patent coronary arteries. Normal LV systolic function   CARDIOVASCULAR STRESS TEST  08/28/2006   Small sized, moderate ischemiain the Basal Inferoseptal, Basal Inferior, Mid Inferior and Apical Inferior regions   TRANSTHORACIC ECHOCARDIOGRAM  02/17/2010   EF >55%, moderate-severely thickened septum, mild-moderate mitral annular calcification    Current Medications: Outpatient Medications Prior to Visit  Medication Sig Dispense Refill   ACCU-CHEK AVIVA PLUS test strip TEST THREE TIMES DAILY AS DIRECTED 100 strip 11   aspirin EC 81 MG tablet Take 1 tablet (81 mg total) by mouth daily. 30 tablet    azelastine (ASTELIN) 0.1 % nasal spray Place 1 spray into both nostrils 2 (two) times daily. Use in each nostril as directed 30 mL 12   B-D UF III MINI PEN NEEDLES 31G X 5 MM MISC Use as directed 4x a day 300 each 3   blood glucose meter kit and supplies Dispense based on patient and insurance preference. Use up to four times daily as directed.dx code e11.65 1 each 0   Blood Glucose Monitoring Suppl (TRUE METRIX METER) DEVI Use to check blood sugars 3 times a day. 1 each 3   cetirizine (ZYRTEC ALLERGY) 10 MG tablet Take 1 tablet (10 mg total) by mouth daily. 90 tablet 1   Cholecalciferol (VITAMIN D3) 50 MCG (2000 UT) TABS Take 1 tablet by mouth daily at 12 noon.     Continuous Blood Gluc Receiver (DEXCOM G6 RECEIVER) DEVI Use to check blood sugar 3 times a day before meals. Dx code e11.65 3 each 9   Continuous Blood Gluc Sensor (DEXCOM G6 SENSOR) MISC Use to check blood  sugar 3 times a day before meals. Dx code e11.65 3 each 9   Ferrous Sulfate (IRON PO) Take 65 mg by mouth daily at 2 am.     fluticasone (FLONASE) 50 MCG/ACT nasal spray Place 2 sprays into both nostrils daily. 18.2 mL 2   Glucagon (GVOKE HYPOPEN 2-PACK) 0.5 MG/0.1ML SOAJ Inject 0.5 mg into the skin daily as needed. 0.1 mL 3   glucose blood (TRUE METRIX BLOOD GLUCOSE TEST) test strip Use as instructed to check blood sugar 3 times a day. Dx code e11.65 100 each 12   hydrOXYzine (ATARAX) 25 MG tablet Take 1 tablet (25 mg total) by mouth 3 (three) times daily as needed for itching. 30 tablet 0   insulin degludec (TRESIBA FLEXTOUCH) 200 UNIT/ML FlexTouch Pen Inject 20  Units into the skin daily. 9 mL 1   insulin lispro (HUMALOG KWIKPEN) 200 UNIT/ML KwikPen inject 8-10 units into THE SKIN 15 minutes BEFORE THREE main meals of THE DAY 9 mL 1   Lancets (ACCU-CHEK SOFT TOUCH) lancets Check blood sugars 3 times daily E11.65 100 each 12   latanoprost (XALATAN) 0.005 % ophthalmic solution Place 1 drop into both eyes at bedtime.      Multiple Vitamin (MULTIVITAMIN) tablet Take 1 tablet by mouth daily.     rosuvastatin (CRESTOR) 20 MG tablet Take 1 tablet (20 mg total) by mouth daily. 90 tablet 3   Semaglutide, 1 MG/DOSE, (OZEMPIC, 1 MG/DOSE,) 2 MG/1.5ML SOPN Inject 1 mg into the skin once a week. 4.5 mL 1   amLODipine (NORVASC) 10 MG tablet Take 1 tablet (10 mg total) by mouth daily. 90 tablet 1   furosemide (LASIX) 20 MG tablet Take 1 tablet (20 mg total) by mouth every other day.     hydrALAZINE (APRESOLINE) 25 MG tablet Take 1.5 tablets (37.5 mg total) by mouth 2 (two) times daily. 180 tablet 2   metoprolol succinate (TOPROL-XL) 25 MG 24 hr tablet Take 1 tablet (25 mg total) by mouth daily. 90 tablet 1   No facility-administered medications prior to visit.     Allergies:   Brimonidine, Dorzolamide, and Timolol   Social History   Socioeconomic History   Marital status: Married    Spouse name: Not on  file   Number of children: Not on file   Years of education: Not on file   Highest education level: Not on file  Occupational History   Occupation: retired  Tobacco Use   Smoking status: Never   Smokeless tobacco: Never  Vaping Use   Vaping Use: Never used  Substance and Sexual Activity   Alcohol use: No   Drug use: Never   Sexual activity: Not Currently  Other Topics Concern   Not on file  Social History Narrative   Not on file   Social Determinants of Health   Financial Resource Strain: Low Risk  (10/19/2021)   Overall Financial Resource Strain (CARDIA)    Difficulty of Paying Living Expenses: Not hard at all  Food Insecurity: No Food Insecurity (10/19/2021)   Hunger Vital Sign    Worried About Running Out of Food in the Last Year: Never true    [place] in the Last Year: Never true  Transportation Needs: No Transportation Needs (10/19/2021)   PRAPARE - Hydrologist (Medical): No    Lack of Transportation (Non-Medical): No  Physical Activity: Inactive (10/19/2021)   Exercise Vital Sign    Days of Exercise per Week: 0 days    Minutes of Exercise per Session: 0 min  Stress: No Stress Concern Present (10/19/2021)   [place]    Feeling of Stress : Not at all  Social Connections: Not on file    Socially she is married for 65 years.  She has 8 children, 10 grandchildren and 10 great-grandchildren.  She is retired.  She completed 10th grade of education.  There is no tobacco history.  She has approximately 1 drink per month.  She tries to exercise at least 1 day/week.   Family History:  The patient's family history includes Diabetes in her brother and sister; Heart Problems in her father; Hypertension in her brother, father, mother, and sister.   ROS General: Negative; No fevers, chills,  or night sweats;  HEENT: Negative; No changes in vision or hearing, sinus congestion,  difficulty swallowing Pulmonary: Negative; No cough, wheezing, shortness of breath, hemoptysis Cardiovascular: Positive for hypertension, dyspnea on exertion.  No chest pain or palpitations GI: Negative; No nausea, vomiting, diarrhea, or abdominal pain GU: Negative; No dysuria, hematuria, or difficulty voiding Musculoskeletal: Negative; no myalgias, joint pain, or weakness Hematologic/Oncology: Negative; no easy bruising, bleeding Endocrine: Negative; no heat/cold intolerance; no diabetes Neuro: History of stroke Skin: Negative; No rashes or skin lesions Psychiatric: Negative; No behavioral problems, depression Sleep: Negative; No snoring, daytime sleepiness, hypersomnolence, bruxism, restless legs, hypnogognic hallucinations, no cataplexy Other comprehensive 14 point system review is negative.   PHYSICAL EXAM:   VS:  BP (!) 158/84   Pulse 66   Ht 5' 2"  (1.575 m)   Wt 164 lb (74.4 kg)   SpO2 97%   BMI 30.00 kg/m     Repeat blood pressure by me was 168/84  Wt Readings from Last 3 Encounters:  11/04/21 164 lb (74.4 kg)  10/19/21 158 lb (71.7 kg)  09/08/21 158 lb 6.4 oz (71.8 kg)    General: Alert, oriented, no distress.  Skin: normal turgor, no rashes, warm and dry HEENT: Normocephalic, atraumatic. Pupils equal round and reactive to light; sclera anicteric; extraocular muscles intact;  Nose without nasal septal hypertrophy Mouth/Parynx benign; Mallinpatti scale 3 Neck: No JVD, no carotid bruits; normal carotid upstroke Lungs: clear to ausculatation and percussion; no wheezing or rales Chest wall: without tenderness to palpitation Heart: PMI not displaced, RRR, s1 s2 normal, 1/6 systolic murmur, no diastolic murmur, no rubs, gallops, thrills, or heaves Abdomen: soft, nontender; no hepatosplenomehaly, BS+; abdominal aorta nontender and not dilated by palpation. Back: no CVA tenderness Pulses 2+ Musculoskeletal: full range of motion, normal strength, no joint  deformities Extremities: ankle edema.  No clubbing cyanosis or edema, Homan's sign negative  Neurologic: grossly nonfocal; Cranial nerves grossly wnl Psychologic: Normal mood and affect   Studies/Labs Reviewed:   November 04, 2021 ECG (independently read by me): NSR at 66 with sinus arrhtyhmia, LBBB  July 01, 2021 ECG (independently read by me): NSR  with mild sinus arrythmia, LBBB, PR 202 msec  April 01, 2021 ECG (independently read by me): NSR at 62; LBBB; PR 202  Recent Labs:    Latest Ref Rng & Units 09/04/2021    3:04 AM 09/03/2021    4:28 AM 09/02/2021    7:03 AM  BMP  Glucose 70 - 99 mg/dL 130  87  254   BUN 8 - 23 mg/dL 40  41  44   Creatinine 0.44 - 1.00 mg/dL 2.89  2.90  2.77   Sodium 135 - 145 mmol/L 145  143  141   Potassium 3.5 - 5.1 mmol/L 3.5  3.4  4.1   Chloride 98 - 111 mmol/L 111  109  109   CO2 22 - 32 mmol/L 23  24  23    Calcium 8.9 - 10.3 mg/dL 9.1  9.0  8.9         Latest Ref Rng & Units 09/04/2021    3:04 AM 09/03/2021    4:28 AM 09/02/2021    7:03 AM  Hepatic Function  Total Protein 6.5 - 8.1 g/dL   7.1   Albumin 3.5 - 5.0 g/dL 2.9  2.7  3.4   AST 15 - 41 U/L   19   ALT 0 - 44 U/L   13   Alk Phosphatase 38 - 126  U/L   87   Total Bilirubin 0.3 - 1.2 mg/dL   0.4        Latest Ref Rng & Units 09/03/2021    4:28 AM 09/02/2021    7:03 AM 07/04/2021   12:00 AM  CBC  WBC 4.0 - 10.5 K/uL 10.8  16.1  9.9      Hemoglobin 12.0 - 15.0 g/dL 9.3  10.2  11.4      Hematocrit 36.0 - 46.0 % 27.7  31.0  36      Platelets 150 - 400 K/uL 295  367  286         This result is from an external source.   Lab Results  Component Value Date   MCV 89.1 09/03/2021   MCV 91.2 09/02/2021   MCV 87 02/17/2021   Lab Results  Component Value Date   TSH 1.520 02/17/2021   Lab Results  Component Value Date   HGBA1C 8.4 (H) 09/02/2021     BNP    Component Value Date/Time   BNP 334.0 (H) 09/02/2021 0703    ProBNP No results found for: "PROBNP"   Lipid Panel      Component Value Date/Time   CHOL 166 08/30/2021 1153   TRIG 143 08/30/2021 1153   HDL 40 08/30/2021 1153   CHOLHDL 4.2 08/30/2021 1153   CHOLHDL 4 10/09/2018 0905   VLDL 37.8 10/09/2018 0905   LDLCALC 101 (H) 08/30/2021 1153   LABVLDL 25 08/30/2021 1153     RADIOLOGY: No results found.   Additional studies/ records that were reviewed today include:  I reviewed laboratory results since November 2020. Records of Dr. Baird Cancer were reviewed  ASSESSMENT:    1. Chronic diastolic heart failure ([place])   2. CKD (chronic kidney disease) stage 4, GFR 15-29 ml/min ([facility])   3. Hyperlipidemia associated with type 2 diabetes mellitus ([place])   4. Swelling   5. Type 2 diabetes mellitus with complication, with long-term current use of insulin ([place])   6. Severe mitral annular calcification     PLAN:  Ms. Arnella Pralle is an 81 year old female who has remote history of CVA in 2002.  Cardiac catheterization by Dr. Melvern Banker in 2004 showed normal coronary arteries.  An echo Doppler study in November 2011 showed asymmetric LVH with grade 1 diastolic dysfunction with increased LA pressure as well as mild to moderate mitral annular calcification with trace MR and mild pulmonary hypertension.  In the past she had had poorly controlled diabetes mellitus.  When I saw her in December 2020 to reestablish care with me I was concerned with progressive rise in serum creatinine increasing to 2.77.  As result she was advised that she hold her telmisartan HCTZ until she had follow-up nephrology evaluation.  She now has continued to be off this regimen and renal function has improved to 2.21 on April 25, 2021. Her echo Doppler study on April 05, 2021 showed hyperdynamic LV function with EF 65 to 70%, moderate LVH, and grade 1 diastolic dysfunction.  There was severe mitral annular calcification and suggestion of very mild mitral stenosis.  She has had continued blood pressure issues and also was hospitalized with  acute on chronic diastolic heart failure in May following eating excessive salty soup.  She required diuresis.  Her blood pressure today is elevated repeat by me was 168/84.  I have recommended that she resume Toprol 12.5 mg.  With her leg swelling history, I am reducing amlodipine from 10 mg down to 5  mg and will increase Lasix from 20 mg every other day to 20 mg daily.  I am further increasing hydralazine to 50 mg twice a day.  I have recommended she wear support stockings.  She is being followed by nephrology for her CKD stage IV.  She continues to be on rosuvastatin 20 mg for hyperlipidemia.  She is diabetic on Tresiba insulin and Ozempic, I have recommended follow-up evaluation in our office with an APP in 2 months and I will see her in 4 to 6 months for follow-up evaluation   Medication Adjustments/Labs and Tests Ordered: Current medicines are reviewed at length with the patient today.  Concerns regarding medicines are outlined above.  Medication changes, Labs and Tests ordered today are listed in the Patient Instructions below. Patient Instructions  Medication Instructions:  Start taking Metoprolol 12.5 mg daily  Start taking Lasix 20 mg daily  Decrease Amlodipine 5 mg daily  Increase Hydralazine 50 mg twice daily   *If you need a refill on your cardiac medications before your next appointment, please call your pharmacy*   Follow-Up: At [facility] ([place] you and your health needs are our priority.  As part of our continuing mission to provide you with exceptional heart care, we have created designated Provider Care Teams.  These Care Teams include your primary Cardiologist (physician) and Advanced Practice Providers (APPs -  Physician Assistants and Nurse Practitioners) who all work together to provide you with the care you need, when you need it.  We recommend signing up for the patient portal called "MyChart".  Sign up information is provided on this After Visit Summary.  MyChart is used to  connect with patients for Virtual Visits (Telemedicine).  Patients are able to view lab/test results, encounter notes, upcoming appointments, etc.  Non-urgent messages can be sent to your provider as well.   To learn more about what you can do with MyChart, go to [email or web address].    Your next appointment:   2 month(s)  The format for your next appointment:   In Person  Provider:   Sande Rives, PA-C, Caron Presume, PA-C, Almyra Deforest, PA-C, or Diona Browner, NP    Then, Shelva Majestic, MD will plan to see you again in 6 month(s).{     Other Instructions  Contact [facility]- [phone]- they will get you set up for a fitting and order compression stockings.    How to Use Compression Stockings  Compression stockings are elastic socks that help increase blood flow (circulation) to the legs, decrease swelling in the legs, and reduce the chance of developing blood clots in the lower legs. Compression stockings squeeze or apply pressure to the legs. The stockings are graduated, meaning the highest amount of pressure occurs at the toes and it decreases going toward the upper part of the leg. This helps ensure proper circulation through the veins. Compression stockings are often used by people who: Are recovering from surgery. The stockings help prevent blood clots after surgery. Have poor circulation or swelling in their legs because of a medical condition, such as chronic venous insufficiency, venous stasis, or lymphedema. Have a history of getting blood clots in their legs. Have bulging (varicose) veins. Sit or stay in bed for long periods of time (immobilization). Stand for long periods of time and experience leg pain or fatigue. Follow instructions from your health care provider about how and when to wear your compression stockings. What are the risks? Generally, compression stockings are safe to wear. However, problems may  occur for some people, such as: The stockings  being ineffective at increasing the circulation to the legs, decreasing swelling in the legs, or reducing the chance of developing blood clots in the lower legs. Skin complications, including breaks in the skin, open wounds, blisters, or dermatitis. How to wear compression stockings Before you put on your compression stockings: Make sure that they are the correct size and degree of compression. If you do not know your size or required grade of compression, ask your health care provider and follow the manufacturer's instructions that come with the stockings. Be sure they are the appropriate length for your medical needs. Compression stockings come in different lengths, including knee high, thigh high, and even up to the waist. Make sure that the stockings are clean, dry, and in good condition. Check the stockings for rips and tears. Do not put them on if they are ripped or torn. Put your stockings on first thing in the morning, before you get out of bed. Keep them on for as long as your health care provider advises. Most people are told to remove their compression stockings at the end of the day before bed. When you are wearing your stockings: Keep them as smooth as possible. Do not allow them to bunch up. It is especially important to prevent the stockings from bunching up around your toes or behind your knees. Make sure that the toe holes are underneath the toes and the heel patches are positioned at the heels. Do not roll the stockings downward and leave them rolled down. This can decrease blood flow to your legs. Change the stockings right away if they become wet or dirty or if they have a bad smell. If you have chronic leg wounds, make sure the wounds are properly covered or dressed before putting on your compression stockings. When you take off your stockings, check your legs and feet for: Open sores. Red spots or other areas of discoloration. Swelling. General tips Do not stop wearing  compression stockings. Talk to your health care provider if your stockings feel too tight. Wash your stockings often with mild detergent in cold or warm water. Also wash them whenever they get dirty or have a bad smell. Do not use bleach. Air-dry your stockings or dry them in a clothes dryer on low heat. It may be helpful to have two pairs so that you have a pair to wear while the other is being washed. Replace your stockings every 3-6 months. If skin moisturizing is part of your treatment plan, apply lotion or cream at night so that your skin will be dry when you put on the stockings in the morning. It is harder to put the stockings on when you have lotion on your legs or feet. Wear nonskid shoes or slip-resistant socks when walking while wearing compression stockings. If you have difficulty putting on or taking off the compression stockings, ask your health care provider about devices that may help make this easier. Contact a health care provider and remove your stockings if: You have a prickling or tingling feeling in your feet or legs. You have new open sores, red spots, or other skin changes on your feet or legs. You have swelling or pain that gets worse. Get help right away if: You have shortness of breath or chest pain. Your heartbeat is fast or irregular. You have new swelling, pain, or warmth in your leg. You have numbness or tingling in your lower legs that does not get better  after you take the stockings off. Your toes or feet are unusually cold or turn a bluish color. You feel light-headed or dizzy. These symptoms may represent a serious problem that is an emergency. Do not wait to see if the symptoms will go away. Get medical help right away. Call your local emergency services (911 in the [place] Do not drive yourself to the hospital. Summary Compression stockings are elastic socks that are worn to treat a variety of symptoms and medical conditions such as venous insufficiency, venous  stasis, or lymphedema. Compression stockings help increase blood flow (circulation) to the legs, decrease swelling in the legs, and reduce the chance of developing blood clots in the lower legs. Follow instructions from your health care provider about how and when to wear your compression stockings. Do not stop wearing your compression stockings without talking to your health care provider first. This information is not intended to replace advice given to you by your health care provider. Make sure you discuss any questions you have with your health care provider. Document Revised: 09/16/2020 Document Reviewed: 09/16/2020 Elsevier Patient Education  Tompkins, Shelva Majestic, MD  11/08/2021 4:41 PM    [facility] [address], [place], [place], [place]  [address] Phone: [phone]

## 2021-11-08 ENCOUNTER — Encounter: Payer: Self-pay | Admitting: Cardiovascular Disease

## 2021-11-10 ENCOUNTER — Ambulatory Visit: Payer: Medicare Other | Admitting: Internal Medicine

## 2021-11-10 DIAGNOSIS — Z794 Long term (current) use of insulin: Secondary | ICD-10-CM | POA: Diagnosis not present

## 2021-11-10 DIAGNOSIS — E119 Type 2 diabetes mellitus without complications: Secondary | ICD-10-CM | POA: Diagnosis not present

## 2021-11-11 ENCOUNTER — Ambulatory Visit: Payer: Medicare Other | Admitting: Internal Medicine

## 2021-11-11 ENCOUNTER — Other Ambulatory Visit: Payer: Self-pay | Admitting: Cardiovascular Disease

## 2021-11-15 ENCOUNTER — Ambulatory Visit
Admission: RE | Admit: 2021-11-15 | Discharge: 2021-11-15 | Disposition: A | Discharge status: Home / Self Care | Payer: Medicare Other | Source: Ambulatory Visit | Attending: Internal Medicine | Admitting: Internal Medicine

## 2021-11-15 DIAGNOSIS — Z1231 Encounter for screening mammogram for malignant neoplasm of breast: Secondary | ICD-10-CM

## 2021-11-23 ENCOUNTER — Emergency Department (HOSPITAL_COMMUNITY): Payer: Medicare Other

## 2021-11-23 ENCOUNTER — Other Ambulatory Visit: Payer: Self-pay

## 2021-11-23 ENCOUNTER — Emergency Department (HOSPITAL_COMMUNITY)
Admission: EM | Admit: 2021-11-23 | Discharge: 2021-11-24 | Discharge status: Against Medical Advice | Payer: Medicare Other | Attending: Emergency Medicine | Admitting: Emergency Medicine

## 2021-11-23 DIAGNOSIS — R0602 Shortness of breath: Secondary | ICD-10-CM | POA: Diagnosis not present

## 2021-11-23 DIAGNOSIS — I509 Heart failure, unspecified: Secondary | ICD-10-CM | POA: Diagnosis not present

## 2021-11-23 DIAGNOSIS — Z79899 Other long term (current) drug therapy: Secondary | ICD-10-CM | POA: Insufficient documentation

## 2021-11-23 DIAGNOSIS — I491 Atrial premature depolarization: Secondary | ICD-10-CM | POA: Diagnosis not present

## 2021-11-23 DIAGNOSIS — M7989 Other specified soft tissue disorders: Secondary | ICD-10-CM | POA: Insufficient documentation

## 2021-11-23 DIAGNOSIS — R42 Dizziness and giddiness: Secondary | ICD-10-CM | POA: Diagnosis not present

## 2021-11-23 DIAGNOSIS — I6381 Other cerebral infarction due to occlusion or stenosis of small artery: Secondary | ICD-10-CM | POA: Diagnosis not present

## 2021-11-23 DIAGNOSIS — E119 Type 2 diabetes mellitus without complications: Secondary | ICD-10-CM | POA: Diagnosis not present

## 2021-11-23 DIAGNOSIS — Z7982 Long term (current) use of aspirin: Secondary | ICD-10-CM | POA: Insufficient documentation

## 2021-11-23 DIAGNOSIS — R531 Weakness: Secondary | ICD-10-CM | POA: Diagnosis not present

## 2021-11-23 DIAGNOSIS — Z5321 Procedure and treatment not carried out due to patient leaving prior to being seen by health care provider: Secondary | ICD-10-CM | POA: Insufficient documentation

## 2021-11-23 DIAGNOSIS — R9431 Abnormal electrocardiogram [ECG] [EKG]: Secondary | ICD-10-CM | POA: Diagnosis not present

## 2021-11-23 DIAGNOSIS — R2243 Localized swelling, mass and lump, lower limb, bilateral: Secondary | ICD-10-CM | POA: Diagnosis not present

## 2021-11-23 DIAGNOSIS — M778 Other enthesopathies, not elsewhere classified: Secondary | ICD-10-CM | POA: Insufficient documentation

## 2021-11-23 DIAGNOSIS — I10 Essential (primary) hypertension: Secondary | ICD-10-CM | POA: Diagnosis not present

## 2021-11-23 DIAGNOSIS — Z794 Long term (current) use of insulin: Secondary | ICD-10-CM | POA: Diagnosis not present

## 2021-11-23 DIAGNOSIS — I110 Hypertensive heart disease with heart failure: Secondary | ICD-10-CM | POA: Diagnosis not present

## 2021-11-23 DIAGNOSIS — R778 Other specified abnormalities of plasma proteins: Secondary | ICD-10-CM | POA: Diagnosis not present

## 2021-11-23 LAB — BASIC METABOLIC PANEL
Anion gap: 10 (ref 5–15)
BUN: 40 mg/dL — ABNORMAL HIGH (ref 8–23)
CO2: 24 mmol/L (ref 22–32)
Calcium: 9.3 mg/dL (ref 8.9–10.3)
Chloride: 107 mmol/L (ref 98–111)
Creatinine, Ser: 3.56 mg/dL — ABNORMAL HIGH (ref 0.44–1.00)
GFR, Estimated: 12 mL/min — ABNORMAL LOW (ref >60)
Glucose, Bld: 166 mg/dL — ABNORMAL HIGH (ref 70–99)
Potassium: 3 mmol/L — ABNORMAL LOW (ref 3.5–5.1)
Sodium: 141 mmol/L (ref 135–145)

## 2021-11-23 LAB — CBC
HCT: 32.1 % — ABNORMAL LOW (ref 36.0–46.0)
Hemoglobin: 11.1 g/dL — ABNORMAL LOW (ref 12.0–15.0)
MCH: 30.3 pg (ref 26.0–34.0)
MCHC: 34.6 g/dL (ref 30.0–36.0)
MCV: 87.7 fL (ref 80.0–100.0)
Platelets: 311 10*3/uL (ref 150–400)
RBC: 3.66 MIL/uL — ABNORMAL LOW (ref 3.87–5.11)
RDW: 12.9 % (ref 11.5–15.5)
WBC: 9.2 10*3/uL (ref 4.0–10.5)
nRBC: 0 % (ref 0.0–0.2)

## 2021-11-23 LAB — TROPONIN I (HIGH SENSITIVITY)
Troponin I (High Sensitivity): 40 ng/L — ABNORMAL HIGH (ref <18)
Troponin I (High Sensitivity): 43 ng/L — ABNORMAL HIGH (ref <18)

## 2021-11-23 LAB — BRAIN NATRIURETIC PEPTIDE: B Natriuretic Peptide: 330.4 pg/mL — ABNORMAL HIGH (ref 0.0–100.0)

## 2021-11-23 NOTE — ED Provider Triage Note (Signed)
Emergency Medicine Provider Triage Evaluation Note  [name] [name] , a 81 y.o. female  was evaluated in triage.  Pt complains of bilateral leg swelling, intermittent dizziness started this morning.  She denies any pain, shortness of breath, but has a history of CHF and is on Lasix.  Patient was not taking her Lasix for several days because of medication adjustment, began taking them again on Sunday..  Review of Systems  Positive: Leg swelling, dizziness Negative: Chest pain, shortness of breath, fever, chills, nausea  Physical Exam  BP (!) 201/77   Pulse 79   Temp 98 F (36.7 C) (Oral)   Resp 16   SpO2 97%  Gen:   Awake, no distress   Resp:  Normal effort  MSK:   Moves extremities without difficulty  Other:  Bilateral lower extremity swelling around 1-2 +.  Intact DP, PT pulses.  Medical Decision Making  Medically screening exam initiated at 4:55 PM.  Appropriate orders placed.  [name] [name] was informed that the remainder of the evaluation will be completed by another provider, this initial triage assessment does not replace that evaluation, and the importance of remaining in the ED until their evaluation is complete.  Workup initiated   Anselmo Pickler, [place] 11/23/21 1658

## 2021-11-23 NOTE — ED Triage Notes (Signed)
Pt here for bilateral leg swelling and intermittent dizziness that started this morning. Pt denies pain/shob, per daughter she has hx of CHF and is con lasix. Pt missed her lasix on Sunday but has taken it since.

## 2021-11-24 ENCOUNTER — Other Ambulatory Visit: Payer: Self-pay

## 2021-11-24 ENCOUNTER — Emergency Department (HOSPITAL_COMMUNITY)
Admission: EM | Admit: 2021-11-24 | Discharge: 2021-11-24 | Disposition: A | Discharge status: Home / Self Care | Payer: Medicare Other | Source: Home / Self Care | Attending: Emergency Medicine | Admitting: Emergency Medicine

## 2021-11-24 ENCOUNTER — Encounter (HOSPITAL_COMMUNITY): Payer: Self-pay | Admitting: Emergency Medicine

## 2021-11-24 ENCOUNTER — Encounter: Payer: Self-pay | Admitting: Cardiovascular Disease

## 2021-11-24 ENCOUNTER — Telehealth: Payer: Self-pay | Admitting: Cardiovascular Disease

## 2021-11-24 ENCOUNTER — Emergency Department (HOSPITAL_COMMUNITY): Payer: Medicare Other

## 2021-11-24 DIAGNOSIS — Z794 Long term (current) use of insulin: Secondary | ICD-10-CM | POA: Insufficient documentation

## 2021-11-24 DIAGNOSIS — I509 Heart failure, unspecified: Secondary | ICD-10-CM | POA: Insufficient documentation

## 2021-11-24 DIAGNOSIS — I110 Hypertensive heart disease with heart failure: Secondary | ICD-10-CM | POA: Insufficient documentation

## 2021-11-24 DIAGNOSIS — M7989 Other specified soft tissue disorders: Secondary | ICD-10-CM | POA: Insufficient documentation

## 2021-11-24 DIAGNOSIS — R531 Weakness: Secondary | ICD-10-CM | POA: Insufficient documentation

## 2021-11-24 DIAGNOSIS — R778 Other specified abnormalities of plasma proteins: Secondary | ICD-10-CM | POA: Insufficient documentation

## 2021-11-24 DIAGNOSIS — R2243 Localized swelling, mass and lump, lower limb, bilateral: Secondary | ICD-10-CM | POA: Diagnosis not present

## 2021-11-24 DIAGNOSIS — E119 Type 2 diabetes mellitus without complications: Secondary | ICD-10-CM | POA: Insufficient documentation

## 2021-11-24 DIAGNOSIS — R9431 Abnormal electrocardiogram [ECG] [EKG]: Secondary | ICD-10-CM | POA: Diagnosis not present

## 2021-11-24 DIAGNOSIS — Z7982 Long term (current) use of aspirin: Secondary | ICD-10-CM | POA: Insufficient documentation

## 2021-11-24 DIAGNOSIS — Z79899 Other long term (current) drug therapy: Secondary | ICD-10-CM | POA: Insufficient documentation

## 2021-11-24 LAB — CBC
HCT: 33 % — ABNORMAL LOW (ref 36.0–46.0)
Hemoglobin: 11.1 g/dL — ABNORMAL LOW (ref 12.0–15.0)
MCH: 30 pg (ref 26.0–34.0)
MCHC: 33.6 g/dL (ref 30.0–36.0)
MCV: 89.2 fL (ref 80.0–100.0)
Platelets: 328 10*3/uL (ref 150–400)
RBC: 3.7 MIL/uL — ABNORMAL LOW (ref 3.87–5.11)
RDW: 13 % (ref 11.5–15.5)
WBC: 10.2 10*3/uL (ref 4.0–10.5)
nRBC: 0 % (ref 0.0–0.2)

## 2021-11-24 LAB — BASIC METABOLIC PANEL
Anion gap: 11 (ref 5–15)
BUN: 46 mg/dL — ABNORMAL HIGH (ref 8–23)
CO2: 25 mmol/L (ref 22–32)
Calcium: 9.5 mg/dL (ref 8.9–10.3)
Chloride: 106 mmol/L (ref 98–111)
Creatinine, Ser: 3.74 mg/dL — ABNORMAL HIGH (ref 0.44–1.00)
GFR, Estimated: 11 mL/min — ABNORMAL LOW (ref >60)
Glucose, Bld: 155 mg/dL — ABNORMAL HIGH (ref 70–99)
Potassium: 3.8 mmol/L (ref 3.5–5.1)
Sodium: 142 mmol/L (ref 135–145)

## 2021-11-24 LAB — TROPONIN I (HIGH SENSITIVITY): Troponin I (High Sensitivity): 41 ng/L — ABNORMAL HIGH (ref <18)

## 2021-11-24 MED ORDER — HYDRALAZINE HCL 20 MG/ML IJ SOLN
10.0000 mg | Freq: Once | INTRAMUSCULAR | Status: AC
  Administered 2021-11-24: 10 mg via INTRAVENOUS
  Filled 2021-11-24: qty 1

## 2021-11-24 MED ORDER — FUROSEMIDE 10 MG/ML IJ SOLN
20.0000 mg | Freq: Once | INTRAMUSCULAR | Status: AC
  Administered 2021-11-24: 20 mg via INTRAVENOUS
  Filled 2021-11-24: qty 2

## 2021-11-24 NOTE — ED Triage Notes (Signed)
Pt presents with bilateral leg swelling, weight gain, dizziness and back pain, per daughter she believes her CHF is flared up.

## 2021-11-24 NOTE — Telephone Encounter (Signed)
Pt's daughter states that pt had to be taken to the ER yesterday. Daughter would like a callback regarding that visit. Please advise

## 2021-11-24 NOTE — ED Provider Notes (Signed)
[facility] EMERGENCY DEPARTMENT Provider Note   CSN: [id number] Arrival date & time: 11/24/21  1147     History Chief Complaint  Patient presents with  . CHF Exacerbation    [name] [name] is a 81 y.o. female with history of hypertension, type 2 diabetes, hyperlipidemia, CHF with most recent echocardiogram 6 months ago which revealed ejection fraction of 65 to 70% who presents today with increased bilateral leg swelling, dizziness, generalized weakness, and 10 pound weight gain in the last week.  Patient denies any shortness of breath or chest pain.  Family is at bedside provides most the history.  She was seen evaluated at [facility] last night where she had BNP and troponins drawn.  She ultimately left due to wait time was not evaluated formally in the back.  She returns today for repeat evaluation.  HPI     Home Medications Prior to Admission medications   Medication Sig Start Date End Date Taking? Authorizing Provider  amLODipine (NORVASC) 5 MG tablet Take 1 tablet (5 mg total) by mouth daily. 11/04/21  Yes Troy Sine, MD  aspirin EC 81 MG tablet Take 1 tablet (81 mg total) by mouth daily. Patient taking differently: Take 325 mg by mouth daily. 09/16/21  Yes Troy Sine, MD  azelastine (ASTELIN) 0.1 % nasal spray Place 1 spray into both nostrils 2 (two) times daily. Use in each nostril as directed 09/07/21  Yes Glendale Chard, MD  Cholecalciferol (VITAMIN D3) 50 MCG (2000 UT) TABS Take 1 tablet by mouth daily at 12 noon.   Yes [provider]  Ferrous Sulfate (IRON PO) Take 65 mg by mouth daily.   Yes [provider]  fluticasone (FLONASE) 50 MCG/ACT nasal spray Place 2 sprays into both nostrils daily. 10/21/20 11/24/21 Yes [name], FNP  furosemide (LASIX) 20 MG tablet Take 1 tablet (20 mg total) by mouth daily. 11/04/21 10/30/22 Yes Troy Sine, MD  Glucagon (GVOKE HYPOPEN 2-PACK) 0.5 MG/0.1ML SOAJ Inject 0.5 mg into the skin daily as needed.  08/02/21  Yes Glendale Chard, MD  hydrALAZINE (APRESOLINE) 50 MG tablet Take 1 tablet (50 mg total) by mouth 2 (two) times daily. 11/04/21  Yes Troy Sine, MD  hydrOXYzine (ATARAX) 25 MG tablet Take 1 tablet (25 mg total) by mouth 3 (three) times daily as needed for itching. 09/04/21  Yes Samuella Cota, MD  insulin degludec (TRESIBA FLEXTOUCH) 200 UNIT/ML FlexTouch Pen Inject 20 Units into the skin daily. 09/16/21  Yes Glendale Chard, MD  insulin lispro (HUMALOG KWIKPEN) 200 UNIT/ML KwikPen inject 8-10 units into THE SKIN 15 minutes BEFORE THREE main meals of THE DAY Patient taking differently: See admin instructions. inject 8-10 units into THE SKIN 15 minutes BEFORE THREE main meals of THE DAY 10/13/21  Yes Philemon Kingdom, MD  latanoprost (XALATAN) 0.005 % ophthalmic solution Place 1 drop into both eyes at bedtime.  03/17/19  Yes [provider]  metoprolol succinate (TOPROL-XL) 25 MG 24 hr tablet Take 0.5 tablets (12.5 mg total) by mouth daily. 11/04/21  Yes Troy Sine, MD  Multiple Vitamin (MULTIVITAMIN) tablet Take 1 tablet by mouth daily.   Yes [provider]  rosuvastatin (CRESTOR) 20 MG tablet Take 1 tablet (20 mg total) by mouth daily. 09/08/21  Yes Duke, Tami Lin, PA  Semaglutide, 1 MG/DOSE, (OZEMPIC, 1 MG/DOSE,) 2 MG/1.5ML SOPN Inject 1 mg into the skin once a week. 05/20/21  Yes Philemon Kingdom, MD  ACCU-CHEK AVIVA PLUS test strip  TEST THREE TIMES DAILY AS DIRECTED 07/20/20   Glendale Chard, MD  B-D UF III MINI PEN NEEDLES 31G X 5 MM MISC Use as directed 4x a day 06/29/21   Philemon Kingdom, MD  blood glucose meter kit and supplies Dispense based on patient and insurance preference. Use up to four times daily as directed.dx code e11.65 03/08/21   Glendale Chard, MD  Blood Glucose Monitoring Suppl (TRUE METRIX METER) DEVI Use to check blood sugars 3 times a day. 03/08/21   Glendale Chard, MD  cetirizine (ZYRTEC ALLERGY) 10 MG tablet Take 1 tablet (10 mg total)  by mouth daily. Patient not taking: Reported on 11/24/2021 08/02/21 08/02/22  Glendale Chard, MD  Continuous Blood Gluc Receiver (DEXCOM G6 RECEIVER) DEVI Use to check blood sugar 3 times a day before meals. Dx code e11.65 07/27/21   Glendale Chard, MD  Continuous Blood Gluc Sensor (DEXCOM G6 SENSOR) MISC Use to check blood sugar 3 times a day before meals. Dx code e11.65 07/27/21   Glendale Chard, MD  glucose blood (TRUE METRIX BLOOD GLUCOSE TEST) test strip Use as instructed to check blood sugar 3 times a day. Dx code e11.65 03/08/21   Glendale Chard, MD  Lancets (ACCU-CHEK SOFT [facility]) lancets Check blood sugars 3 times daily E11.65 02/12/18   Glendale Chard, MD  telmisartan-hydrochlorothiazide (MICARDIS HCT) 80-25 MG tablet Take 1 tablet by mouth daily. Patient not taking: Reported on 11/24/2021    [provider]      Allergies    Brimonidine, Dorzolamide, and Timolol    Review of Systems   Review of Systems  All other systems reviewed and are negative.   Physical Exam Updated Vital Signs BP (!) 182/72   Pulse 74   Temp 98.1 F (36.7 C) (Oral)   Resp 13   Ht 5' 2"  (1.575 m)   Wt 74.4 kg   SpO2 98%   BMI 30.00 kg/m  Physical Exam Vitals and nursing note reviewed.  Constitutional:      General: She is not in acute distress.    Appearance: Normal appearance.  HENT:     Head: Normocephalic and atraumatic.  Eyes:     General:        Right eye: No discharge.        Left eye: No discharge.  Cardiovascular:     Comments: Regular rate and rhythm.  S1/S2 are distinct without any evidence of murmur, rubs, or gallops.  Radial pulses are 2+ bilaterally.  Dorsalis pedis pulses are 2+ bilaterally.   Pulmonary:     Comments: Clear to auscultation bilaterally.  Normal effort.  No respiratory distress.  No evidence of wheezes, rales, or rhonchi heard throughout. Abdominal:     General: Abdomen is flat. Bowel sounds are normal. There is no distension.     Tenderness: There is no  abdominal tenderness. There is no guarding or rebound.  Musculoskeletal:        General: Normal range of motion.     Cervical back: Neck supple.     Right lower leg: 2+ Pitting Edema present.     Left lower leg: 2+ Pitting Edema present.  Skin:    General: Skin is warm and dry.     Findings: No rash.  Neurological:     General: No focal deficit present.     Mental Status: She is alert.  Psychiatric:        Mood and Affect: Mood normal.        Behavior:  Behavior normal.     ED Results / Procedures / Treatments   Labs (all labs ordered are listed, but only abnormal results are displayed) Labs Reviewed  BASIC METABOLIC PANEL - Abnormal; Notable for the following components:      Result Value   Glucose, Bld 155 (*)    BUN 46 (*)    Creatinine, Ser 3.74 (*)    GFR, Estimated 11 (*)    All other components within normal limits  CBC - Abnormal; Notable for the following components:   RBC 3.70 (*)    Hemoglobin 11.1 (*)    HCT 33.0 (*)    All other components within normal limits  TROPONIN I (HIGH SENSITIVITY) - Abnormal; Notable for the following components:   Troponin I (High Sensitivity) 41 (*)    All other components within normal limits    EKG EKG Interpretation  Date/Time:  Thursday November 24 2021 12:29:31 EDT Ventricular Rate:  76 PR Interval:  182 QRS Duration: 140 QT Interval:  446 QTC Calculation: 501 R Axis:   -24 Text Interpretation: Normal sinus rhythm with sinus arrhythmia Left ventricular hypertrophy with QRS widening and repolarization abnormality ( R in aVL , Cornell product ) Abnormal ECG  No sig change from last night tracing Confirmed by Octaviano Glow [phone]) on 11/24/2021 1:38:17 PM  Radiology DG Chest 2 View  Result Date: 11/24/2021 CLINICAL DATA:  Congestive heart failure EXAM: CHEST - 2 VIEW COMPARISON:  November 23, 2021 FINDINGS: The cardiomediastinal silhouette is unchanged in contour. No acute pleuroparenchymal abnormality. Unchanged left  basilar linear opacities, likely atelectasis/scarring. No pleural effusion. No pneumothorax. Visualized abdomen is unremarkable. Multilevel degenerative changes of the thoracic spine. IMPRESSION: No active cardiopulmonary disease. Electronically Signed   By: Beryle Flock M.D.   On: 11/24/2021 12:56   CT Head Wo Contrast  Result Date: 11/23/2021 CLINICAL DATA:  Intermittent dizziness for several days, initial encounter EXAM: CT HEAD WITHOUT CONTRAST TECHNIQUE: Contiguous axial images were obtained from the base of the skull through the vertex without intravenous contrast. RADIATION DOSE REDUCTION: This exam was performed according to the departmental dose-optimization program which includes automated exposure control, adjustment of the mA and/or kV according to patient size and/or use of iterative reconstruction technique. COMPARISON:  MRI from 02/12/2019 FINDINGS: Brain: No evidence of acute infarction, hemorrhage, hydrocephalus, extra-axial collection or mass lesion/mass effect. Chronic ischemic changes are noted with left basal ganglia lacunar infarct as well as a large infarct in the right temporoparietal region. This is stable from the prior MRI examination. Ex vacuo dilatation of the right lateral ventricle is noted. Mild atrophic changes are noted. Vascular: No hyperdense vessel or unexpected calcification. Skull: Normal. Negative for fracture or focal lesion. Sinuses/Orbits: No acute finding. Other: None. IMPRESSION: Chronic atrophic and ischemic changes similar to that seen on the prior exam. No acute intracranial abnormality noted. Electronically Signed   By: Inez Catalina M.D.   On: 11/23/2021 19:05   DG Chest 2 View  Result Date: 11/23/2021 CLINICAL DATA:  Shortness of breath EXAM: CHEST - 2 VIEW COMPARISON:  09/02/2021 FINDINGS: Mild cardiomegaly. Both lungs are clear. Disc degenerative thoracic spine. IMPRESSION: Mild cardiomegaly without acute abnormality of the lungs. Electronically Signed    By: Delanna Ahmadi M.D.   On: 11/23/2021 18:15    Procedures Procedures    Medications Ordered in ED Medications  furosemide (LASIX) injection 20 mg (20 mg Intravenous Given 11/24/21 1358)  hydrALAZINE (APRESOLINE) injection 10 mg (10 mg Intravenous Given 11/24/21 1713)  ED Course/ Medical Decision Making/ A&P Clinical Course as of 11/24/21 1757  Thu Nov 24, 2021  1745 CBC(!) Mild anemia.  No evidence of leukocytosis. [CF]  1753 Troponin I (High Sensitivity)(!) Elevated troponin but is in the ballpark of previous results.  She denies chest pain. [CF]  8110 Basic metabolic panel(!) Elevated creatinine in the setting of chronic kidney disease.  Seems to be at the patient's baseline.  Elevated glucose. [CF]  3159 DG Chest 2 View I personally ordered and interpreted a chest x-ray which does not reveal any significant signs of pneumonia or pleural effusions.  I do agree with radiology interpretation. [CF]    Clinical Course User Index [CF] Hendricks Limes, PA-C                           Medical Decision Making [name] [name] is a 81 y.o. female patient who presents to the emerged from today for further evaluation of bilateral leg swelling and increased weight gain.  I suspect CHF exacerbation given the history and the patient forgetting to take her Lasix all last week.  I will repeat some labs that were obtained yesterday.  Do not feel that getting a BNP today is of increased benefit.  Patient is in no acute distress apart from being hypertensive.  I will plan to reassess after some labs return.  Patient feeling about the same.  I went over all labs and imaging with her at the bedside.  No evidence of pleural effusion.  She has no evidence of chest pain or shortness of breath to indicate acute respiratory distress.  She does have bilateral lower leg swelling which does indicate volume overload.  BNP was 330 yesterday.  She decision-making was done and I feel that the patient can likely  go home.  Given her renal status I will keep her at the same dose of her home Lasix and she will take it daily.  I am good to have her follow-up with her PCP on Monday.  Strict return precautions were discussed.  She is safe for discharge at this time.   Amount and/or Complexity of Data Reviewed Labs: ordered. Decision-making details documented in ED Course. Radiology: ordered.  Risk Prescription drug management.     Final Clinical Impression(s) / ED Diagnoses Final diagnoses:  Leg swelling  Elevated troponin    Rx / DC Orders ED Discharge Orders     None         Hendricks Limes, [place] 11/24/21 2345    Wyvonnia Dusky, MD 11/25/21 [phone]

## 2021-11-24 NOTE — Telephone Encounter (Signed)
Spoke with pt's daughter, [name] regarding her mom's visit to the the ED yesterday for dizziness and swelling pt did have some diagonostic testing however they were unable to stay after an 8 hour wait. Daughter would like for Dr. Claiborne Billings to review testing and make recommendations. Daughter also wants to make Dr. Claiborne Billings aware that she is complaining of her upper left side of her back is hurting today. Will forward to provider for recommendations.

## 2021-11-24 NOTE — ED Notes (Signed)
Pt stated that the wait was too long and wanted to leave. Pt wanted the armband cut off and then was seen leaving with family.

## 2021-11-24 NOTE — Discharge Instructions (Addendum)
Please take your Lasix daily.  I would like for you to follow-up with your primary care doctor or your cardiologist early next week.  Return to the emergency department for any worsening symptoms including shortness of breath, chest pain, worsening swelling or weight gain.

## 2021-11-28 ENCOUNTER — Encounter: Payer: Self-pay | Admitting: Cardiovascular Disease

## 2021-11-29 ENCOUNTER — Ambulatory Visit (INDEPENDENT_AMBULATORY_CARE_PROVIDER_SITE_OTHER): Payer: Medicare Other | Admitting: Internal Medicine

## 2021-11-29 ENCOUNTER — Encounter: Payer: Self-pay | Admitting: Internal Medicine

## 2021-11-29 VITALS — BP 142/80 | Temp 98.1°F | Ht 62.0 in | Wt 162.0 lb

## 2021-11-29 DIAGNOSIS — E1122 Type 2 diabetes mellitus with diabetic chronic kidney disease: Secondary | ICD-10-CM

## 2021-11-29 DIAGNOSIS — I130 Hypertensive heart and chronic kidney disease with heart failure and stage 1 through stage 4 chronic kidney disease, or unspecified chronic kidney disease: Secondary | ICD-10-CM | POA: Diagnosis not present

## 2021-11-29 DIAGNOSIS — M7989 Other specified soft tissue disorders: Secondary | ICD-10-CM | POA: Diagnosis not present

## 2021-11-29 DIAGNOSIS — E7800 Pure hypercholesterolemia, unspecified: Secondary | ICD-10-CM | POA: Diagnosis not present

## 2021-11-29 DIAGNOSIS — I5032 Chronic diastolic (congestive) heart failure: Secondary | ICD-10-CM

## 2021-11-29 DIAGNOSIS — E1159 Type 2 diabetes mellitus with other circulatory complications: Secondary | ICD-10-CM | POA: Diagnosis not present

## 2021-11-29 DIAGNOSIS — M545 Low back pain, unspecified: Secondary | ICD-10-CM

## 2021-11-29 DIAGNOSIS — E1165 Type 2 diabetes mellitus with hyperglycemia: Secondary | ICD-10-CM | POA: Diagnosis not present

## 2021-11-29 DIAGNOSIS — N1832 Chronic kidney disease, stage 3b: Secondary | ICD-10-CM

## 2021-11-29 NOTE — Progress Notes (Signed)
I,Victoria T Hamilton,acting as a scribe for Maximino Greenland, MD.,have documented all relevant documentation on the behalf of Maximino Greenland, MD,as directed by  Maximino Greenland, MD while in the presence of Maximino Greenland, MD.    Subjective:     Patient ID: [name] [name] , female    DOB: 1940-11-11 , 81 y.o.   MRN: [id number]   Chief Complaint  Patient presents with   Follow-up    HPI  Patient presents today for ED follow up. She is accompanied today by her daughter. She initially went to [facility] ER on 11/24/2021 for evaluation of swelling in lower extremities & weight gain.  She presented to ER with increased bilateral leg swelling, dizziness, generalized weakness, and 10 pound weight gain in the last week.  Patient denied any shortness of breath or chest pain.  She was seen evaluated at [facility] the night prior to [facility] presentation, where she had BNP and troponins drawn.  She ultimately left due to wait time was not evaluated formally in the back.  She returns today for repeat evaluation.  Since being discharged from ER on 8/17, she has not had any worsening sob/cp.       Hypertension This is a chronic problem. The current episode started more than 1 year ago. The problem has been gradually improving since onset. The problem is controlled. Risk factors for coronary artery disease include diabetes mellitus, dyslipidemia, post-menopausal state and sedentary lifestyle. The current treatment provides moderate improvement. Compliance problems include exercise.  Hypertensive end-organ damage includes kidney disease.  Diabetes She presents for her follow-up diabetic visit. She has type 2 diabetes mellitus. There are no hypoglycemic complications. Diabetic complications include nephropathy. Risk factors for coronary artery disease include diabetes mellitus, dyslipidemia, hypertension, post-menopausal and sedentary lifestyle. Current diabetic treatment includes insulin injections  and oral agent (monotherapy). She participates in exercise intermittently. An ACE inhibitor/angiotensin II receptor blocker is being taken. Eye exam is current.     Past Medical History:  Diagnosis Date   Diabetes mellitus ([place])    H/O: CVA (cerebrovascular accident) 2002   HTN (hypertension)    Hyperlipidemia    Pulmonary HTN (HCC)    mild by echo     Family History  Problem Relation Age of Onset   Hypertension Mother    Hypertension Father    Heart Problems Father    Hypertension Sister    Diabetes Sister    Hypertension Brother    Diabetes Brother    Breast cancer Neg Hx      Current Outpatient Medications:    ACCU-CHEK AVIVA PLUS test strip, TEST THREE TIMES DAILY AS DIRECTED, Disp: 100 strip, Rfl: 11   amLODipine (NORVASC) 5 MG tablet, Take 1 tablet (5 mg total) by mouth daily., Disp: 90 tablet, Rfl: 3   aspirin EC 81 MG tablet, Take 1 tablet (81 mg total) by mouth daily. (Patient taking differently: Take 325 mg by mouth daily.), Disp: 30 tablet, Rfl:    azelastine (ASTELIN) 0.1 % nasal spray, Place 1 spray into both nostrils 2 (two) times daily. Use in each nostril as directed, Disp: 30 mL, Rfl: 12   blood glucose meter kit and supplies, Dispense based on patient and insurance preference. Use up to four times daily as directed.dx code e11.65, Disp: 1 each, Rfl: 0   Blood Glucose Monitoring Suppl (TRUE METRIX METER) DEVI, Use to check blood sugars 3 times a day., Disp: 1 each, Rfl: 3   Cholecalciferol (  VITAMIN D3) 50 MCG (2000 UT) TABS, Take 1 tablet by mouth daily at 12 noon., Disp: , Rfl:    Continuous Blood Gluc Receiver ([place]) DEVI, Use to check blood sugar 3 times a day before meals. Dx code e11.65, Disp: 3 each, Rfl: 9   Continuous Blood Gluc Sensor (DEXCOM G6 SENSOR) MISC, Use to check blood sugar 3 times a day before meals. Dx code e11.65, Disp: 3 each, Rfl: 9   Ferrous Sulfate (IRON PO), Take 65 mg by mouth daily., Disp: , Rfl:    Glucagon (GVOKE  HYPOPEN 2-PACK) 0.5 MG/0.1ML SOAJ, Inject 0.5 mg into the skin daily as needed., Disp: 0.1 mL, Rfl: 3   glucose blood (TRUE METRIX BLOOD GLUCOSE TEST) test strip, Use as instructed to check blood sugar 3 times a day. Dx code e11.65, Disp: 100 each, Rfl: 12   insulin degludec (TRESIBA FLEXTOUCH) 200 UNIT/ML FlexTouch Pen, Inject 20 Units into the skin daily., Disp: 9 mL, Rfl: 1   insulin lispro (HUMALOG KWIKPEN) 200 UNIT/ML KwikPen, inject 8-10 units into THE SKIN 15 minutes BEFORE THREE main meals of THE DAY (Patient taking differently: See admin instructions. inject 8-10 units into THE SKIN 15 minutes BEFORE THREE main meals of THE DAY), Disp: 9 mL, Rfl: 1   Lancets (ACCU-CHEK SOFT TOUCH) lancets, Check blood sugars 3 times daily E11.65, Disp: 100 each, Rfl: 12   latanoprost (XALATAN) 0.005 % ophthalmic solution, Place 1 drop into both eyes at bedtime. , Disp: , Rfl:    metoprolol succinate (TOPROL-XL) 25 MG 24 hr tablet, Take 0.5 tablets (12.5 mg total) by mouth daily., Disp: 45 tablet, Rfl: 3   Multiple Vitamin (MULTIVITAMIN) tablet, Take 1 tablet by mouth daily., Disp: , Rfl:    Semaglutide, 1 MG/DOSE, (OZEMPIC, 1 MG/DOSE,) 2 MG/1.5ML SOPN, Inject 1 mg into the skin once a week., Disp: 4.5 mL, Rfl: 1   atorvastatin (LIPITOR) 20 MG tablet, Take 1 tablet (20 mg total) by mouth daily., Disp: 90 tablet, Rfl: 2   B-D UF III MINI PEN NEEDLES 31G X 5 MM MISC, Use as directed 4x a day, Disp: 300 each, Rfl: 3   hydrALAZINE (APRESOLINE) 50 MG tablet, Take 1 tablet (50 mg total) by mouth 3 (three) times daily., Disp: 270 tablet, Rfl: 1   hydrOXYzine (ATARAX) 25 MG tablet, Take 1 tablet (25 mg total) by mouth 3 (three) times daily as needed for itching., Disp: 30 tablet, Rfl: 0   Allergies  Allergen Reactions   Brimonidine Other (See Comments)    Eyes swell and itch   Dorzolamide     Other reaction(s): Other (See Comments) Red, inflamed eyelid, dermatitis    Timolol Other (See Comments)    Caused eyes  to swell and itch     Review of Systems  Constitutional: Negative.   Respiratory: Negative.    Cardiovascular: Negative.   Musculoskeletal:  Positive for back pain.  Neurological: Negative.   Psychiatric/Behavioral: Negative.       Today's Vitals   11/29/21 1238  BP: (!) 142/80  Temp: 98.1 F (36.7 C)  SpO2: 98%  Weight: 162 lb (73.5 kg)  Height: 5' 2"  (1.575 m)  PainSc: 10-Worst pain ever   Body mass index is 29.63 kg/m.  BP Readings from Last 3 Encounters:  12/22/21 138/62  12/07/21 (!) 162/70  12/03/21 (!) 172/87   Wt Readings from Last 3 Encounters:  12/22/21 165 lb 9.6 oz (75.1 kg)  12/07/21 153 lb 12.8 oz (69.8 kg)  11/29/21 162 lb (73.5 kg)    Objective:  Physical Exam Vitals and nursing note reviewed.  Constitutional:      Appearance: Normal appearance.  HENT:     Head: Normocephalic and atraumatic.  Eyes:     Extraocular Movements: Extraocular movements intact.  Cardiovascular:     Rate and Rhythm: Normal rate and regular rhythm.     Heart sounds: Murmur heard.  Pulmonary:     Effort: Pulmonary effort is normal.     Breath sounds: Normal breath sounds.  Musculoskeletal:        General: Tenderness present.     Cervical back: Normal range of motion.     Right lower leg: Edema present.     Left lower leg: Edema present.  Skin:    General: Skin is warm.  Neurological:     General: No focal deficit present.     Mental Status: She is alert.  Psychiatric:        Mood and Affect: Mood normal.        Behavior: Behavior normal.       Assessment And Plan:     1. Hypertensive heart and renal disease with renal failure, stage 1 through stage 4 or unspecified chronic kidney disease, with heart failure (HCC) Comments: Chronic, uncontrolled. No longer on telmisartan due to CKD stage 3, as per Cardiology. She will c/w lasix, amlodipine and hydralazine.   2. Chronic diastolic CHF (congestive heart failure) ([place]) Comments: Chronic, importance of  dietary/medication compliance was d/w patient.   3. Stage 3b chronic kidney disease ([place]) Comments: Chronic, reminded to avoid NSAIDs. Will send lab results to Dr. Arty Baumgartner, her nephrologist. Importance of optimal BP/BS control was d/w patient.   4. Leg swelling Comments: She is reminded of importance to follow low sodium diet and importance of regular activity.   5. Poorly controlled type 2 diabetes mellitus with circulatory disorder ([place]) Comments: Chronic, I will check labs as below. Unfortunately, she missed recent Endo appt.  - Hemoglobin A1c - Urine microalbumin-creatinine with uACR  6. Pure hypercholesterolemia Comments: I will stop rosuvastatin due to CKD level. I will switch her to atorvastatin.  7. Acute right-sided low back pain without sciatica Comments: She is reminded to avoid NSAIDs. Advised to apply some topical pain cream to affected areas. She will let me know if her sx persist.    Patient was given opportunity to ask questions. Patient verbalized understanding of the plan and was able to repeat key elements of the plan. All questions were answered to their satisfaction.   I, Maximino Greenland, MD, have reviewed all documentation for this visit. The documentation on 11/29/21 for the exam, diagnosis, procedures, and orders are all accurate and complete.   IF YOU HAVE BEEN REFERRED TO A SPECIALIST, IT MAY TAKE 1-2 WEEKS TO SCHEDULE/PROCESS THE REFERRAL. IF YOU HAVE NOT HEARD FROM US/SPECIALIST IN TWO WEEKS, PLEASE GIVE [place] A CALL AT [phone].   THE PATIENT IS ENCOURAGED TO PRACTICE SOCIAL DISTANCING DUE TO THE COVID-19 PANDEMIC.

## 2021-11-30 ENCOUNTER — Encounter: Payer: Self-pay | Admitting: Internal Medicine

## 2021-11-30 LAB — HEMOGLOBIN A1C
Est. average glucose Bld gHb Est-mCnc: 160 mg/dL
Hgb A1c MFr Bld: 7.2 % — ABNORMAL HIGH (ref 4.8–5.6)

## 2021-11-30 LAB — MICROALBUMIN / CREATININE URINE RATIO
Creatinine, Urine: 56.4 mg/dL
Microalb/Creat Ratio: 6735 mg/g creat — ABNORMAL HIGH (ref 0–29)
Microalbumin, Urine: 3798.4 ug/mL

## 2021-11-30 MED ORDER — ATORVASTATIN CALCIUM 20 MG PO TABS: 20.0000 mg | ORAL_TABLET | Freq: Every day | ORAL | 2 refills | Status: DC

## 2021-12-01 ENCOUNTER — Emergency Department (HOSPITAL_COMMUNITY): Payer: Medicare Other

## 2021-12-01 ENCOUNTER — Observation Stay (HOSPITAL_COMMUNITY)
Admission: EM | Admit: 2021-12-01 | Discharge: 2021-12-03 | Disposition: A | Discharge status: Home Health | Payer: Medicare Other | Attending: Internal Medicine | Admitting: Internal Medicine

## 2021-12-01 ENCOUNTER — Other Ambulatory Visit: Payer: Self-pay

## 2021-12-01 ENCOUNTER — Encounter (HOSPITAL_COMMUNITY): Payer: Self-pay

## 2021-12-01 DIAGNOSIS — D631 Anemia in chronic kidney disease: Secondary | ICD-10-CM | POA: Insufficient documentation

## 2021-12-01 DIAGNOSIS — R9431 Abnormal electrocardiogram [ECG] [EKG]: Secondary | ICD-10-CM | POA: Insufficient documentation

## 2021-12-01 DIAGNOSIS — I10 Essential (primary) hypertension: Secondary | ICD-10-CM | POA: Diagnosis not present

## 2021-12-01 DIAGNOSIS — E782 Mixed hyperlipidemia: Secondary | ICD-10-CM | POA: Insufficient documentation

## 2021-12-01 DIAGNOSIS — R079 Chest pain, unspecified: Secondary | ICD-10-CM | POA: Diagnosis not present

## 2021-12-01 DIAGNOSIS — N179 Acute kidney failure, unspecified: Secondary | ICD-10-CM | POA: Diagnosis not present

## 2021-12-01 DIAGNOSIS — R739 Hyperglycemia, unspecified: Secondary | ICD-10-CM | POA: Diagnosis not present

## 2021-12-01 DIAGNOSIS — R2681 Unsteadiness on feet: Secondary | ICD-10-CM | POA: Insufficient documentation

## 2021-12-01 DIAGNOSIS — M40204 Unspecified kyphosis, thoracic region: Secondary | ICD-10-CM | POA: Diagnosis not present

## 2021-12-01 DIAGNOSIS — R778 Other specified abnormalities of plasma proteins: Secondary | ICD-10-CM | POA: Diagnosis not present

## 2021-12-01 DIAGNOSIS — R269 Unspecified abnormalities of gait and mobility: Secondary | ICD-10-CM | POA: Diagnosis not present

## 2021-12-01 DIAGNOSIS — Z794 Long term (current) use of insulin: Secondary | ICD-10-CM | POA: Insufficient documentation

## 2021-12-01 DIAGNOSIS — I447 Left bundle-branch block, unspecified: Secondary | ICD-10-CM | POA: Diagnosis not present

## 2021-12-01 DIAGNOSIS — E876 Hypokalemia: Secondary | ICD-10-CM | POA: Diagnosis not present

## 2021-12-01 DIAGNOSIS — R531 Weakness: Secondary | ICD-10-CM | POA: Diagnosis not present

## 2021-12-01 DIAGNOSIS — Z8673 Personal history of transient ischemic attack (TIA), and cerebral infarction without residual deficits: Secondary | ICD-10-CM | POA: Diagnosis not present

## 2021-12-01 DIAGNOSIS — E1122 Type 2 diabetes mellitus with diabetic chronic kidney disease: Secondary | ICD-10-CM | POA: Diagnosis not present

## 2021-12-01 DIAGNOSIS — I6381 Other cerebral infarction due to occlusion or stenosis of small artery: Secondary | ICD-10-CM | POA: Diagnosis not present

## 2021-12-01 DIAGNOSIS — M546 Pain in thoracic spine: Secondary | ICD-10-CM | POA: Diagnosis not present

## 2021-12-01 DIAGNOSIS — I5032 Chronic diastolic (congestive) heart failure: Secondary | ICD-10-CM | POA: Diagnosis not present

## 2021-12-01 DIAGNOSIS — E1165 Type 2 diabetes mellitus with hyperglycemia: Secondary | ICD-10-CM

## 2021-12-01 DIAGNOSIS — J9811 Atelectasis: Secondary | ICD-10-CM | POA: Diagnosis not present

## 2021-12-01 DIAGNOSIS — R404 Transient alteration of awareness: Secondary | ICD-10-CM | POA: Diagnosis not present

## 2021-12-01 DIAGNOSIS — N189 Chronic kidney disease, unspecified: Secondary | ICD-10-CM | POA: Diagnosis not present

## 2021-12-01 DIAGNOSIS — Z7982 Long term (current) use of aspirin: Secondary | ICD-10-CM | POA: Insufficient documentation

## 2021-12-01 DIAGNOSIS — I130 Hypertensive heart and chronic kidney disease with heart failure and stage 1 through stage 4 chronic kidney disease, or unspecified chronic kidney disease: Secondary | ICD-10-CM | POA: Diagnosis not present

## 2021-12-01 DIAGNOSIS — I129 Hypertensive chronic kidney disease with stage 1 through stage 4 chronic kidney disease, or unspecified chronic kidney disease: Secondary | ICD-10-CM | POA: Diagnosis not present

## 2021-12-01 DIAGNOSIS — R55 Syncope and collapse: Secondary | ICD-10-CM

## 2021-12-01 DIAGNOSIS — Z79899 Other long term (current) drug therapy: Secondary | ICD-10-CM | POA: Diagnosis not present

## 2021-12-01 DIAGNOSIS — E119 Type 2 diabetes mellitus without complications: Secondary | ICD-10-CM

## 2021-12-01 DIAGNOSIS — Z743 Need for continuous supervision: Secondary | ICD-10-CM | POA: Diagnosis not present

## 2021-12-01 DIAGNOSIS — N184 Chronic kidney disease, stage 4 (severe): Secondary | ICD-10-CM | POA: Insufficient documentation

## 2021-12-01 DIAGNOSIS — R6889 Other general symptoms and signs: Secondary | ICD-10-CM | POA: Diagnosis not present

## 2021-12-01 HISTORY — DX: Syncope and collapse: R55

## 2021-12-01 LAB — MAGNESIUM: Magnesium: 3.1 mg/dL — ABNORMAL HIGH (ref 1.7–2.4)

## 2021-12-01 LAB — BASIC METABOLIC PANEL
Anion gap: 10 (ref 5–15)
BUN: 82 mg/dL — ABNORMAL HIGH (ref 8–23)
CO2: 24 mmol/L (ref 22–32)
Calcium: 8.3 mg/dL — ABNORMAL LOW (ref 8.9–10.3)
Chloride: 105 mmol/L (ref 98–111)
Creatinine, Ser: 4.58 mg/dL — ABNORMAL HIGH (ref 0.44–1.00)
GFR, Estimated: 9 mL/min — ABNORMAL LOW (ref >60)
Glucose, Bld: 114 mg/dL — ABNORMAL HIGH (ref 70–99)
Potassium: 2.9 mmol/L — ABNORMAL LOW (ref 3.5–5.1)
Sodium: 139 mmol/L (ref 135–145)

## 2021-12-01 LAB — CBC
HCT: 29.4 % — ABNORMAL LOW (ref 36.0–46.0)
Hemoglobin: 9.9 g/dL — ABNORMAL LOW (ref 12.0–15.0)
MCH: 30.2 pg (ref 26.0–34.0)
MCHC: 33.7 g/dL (ref 30.0–36.0)
MCV: 89.6 fL (ref 80.0–100.0)
Platelets: 335 10*3/uL (ref 150–400)
RBC: 3.28 MIL/uL — ABNORMAL LOW (ref 3.87–5.11)
RDW: 13.2 % (ref 11.5–15.5)
WBC: 10.3 10*3/uL (ref 4.0–10.5)
nRBC: 0 % (ref 0.0–0.2)

## 2021-12-01 LAB — TROPONIN I (HIGH SENSITIVITY)
Troponin I (High Sensitivity): 35 ng/L — ABNORMAL HIGH (ref <18)
Troponin I (High Sensitivity): 37 ng/L — ABNORMAL HIGH (ref <18)

## 2021-12-01 LAB — CBG MONITORING, ED: Glucose-Capillary: 118 mg/dL — ABNORMAL HIGH (ref 70–99)

## 2021-12-01 MED ORDER — POTASSIUM CHLORIDE CRYS ER 20 MEQ PO TBCR
40.0000 meq | EXTENDED_RELEASE_TABLET | Freq: Once | ORAL | Status: AC
  Administered 2021-12-01: 40 meq via ORAL
  Filled 2021-12-01: qty 2

## 2021-12-01 MED ORDER — SODIUM CHLORIDE 0.9 % IV BOLUS
500.0000 mL | Freq: Once | INTRAVENOUS | Status: AC
  Administered 2021-12-01: 500 mL via INTRAVENOUS

## 2021-12-01 NOTE — ED Triage Notes (Signed)
Per EMS called for stroke. On screen pt did not qualify for stroke. Pt daughter reports that she passed out. EMS states that this is the second time it has happened today. EMS was called earlier today but pt refused to go to the hospital.   Pt only complaint is back pain  EMS CBG 120  20G LH BP 167/85 Oxygen 96%

## 2021-12-01 NOTE — ED Provider Notes (Signed)
[facility] EMERGENCY DEPARTMENT Provider Note   CSN: [id number] Arrival date & time: 12/01/21  2044     History  Chief Complaint  Patient presents with   Loss of Consciousness    [name] [name] is a 81 y.o. female.  HPI 81 year old female presents with syncope.  History is primarily from the daughter.  Patient has been complaining of thoracic back pain for about a week.  No trauma.  It has been worse over the last 3 days.  No chest pain, shortness of breath.  However when the daughter went over to check on her, she was having slurred speech at 6:30 PM.  It is unknown how long she was having it before.  She also seem like she was having bilateral arm shaking.  Her glucose was okay.  At some point after dinner she passed out.  She felt acutely lightheaded. Before this, patient was eating with her hands, which the daughter states is not normal for her and her arms still seem to be shaking.  Home Medications Prior to Admission medications   Medication Sig Start Date End Date Taking? Authorizing Provider  ACCU-CHEK AVIVA PLUS test strip TEST THREE TIMES DAILY AS DIRECTED 07/20/20   Glendale Chard, MD  amLODipine (NORVASC) 5 MG tablet Take 1 tablet (5 mg total) by mouth daily. 11/04/21   Troy Sine, MD  aspirin EC 81 MG tablet Take 1 tablet (81 mg total) by mouth daily. Patient taking differently: Take 325 mg by mouth daily. 09/16/21   Troy Sine, MD  atorvastatin (LIPITOR) 20 MG tablet Take 1 tablet (20 mg total) by mouth daily. 11/30/21 11/30/22  Glendale Chard, MD  azelastine (ASTELIN) 0.1 % nasal spray Place 1 spray into both nostrils 2 (two) times daily. Use in each nostril as directed 09/07/21   Glendale Chard, MD  B-D UF III MINI PEN NEEDLES 31G X 5 MM MISC Use as directed 4x a day 06/29/21   Philemon Kingdom, MD  blood glucose meter kit and supplies Dispense based on patient and insurance preference. Use up to four times daily as directed.dx code e11.65 03/08/21   Glendale Chard, MD  Blood Glucose Monitoring Suppl (TRUE METRIX METER) DEVI Use to check blood sugars 3 times a day. 03/08/21   Glendale Chard, MD  cetirizine (ZYRTEC ALLERGY) 10 MG tablet Take 1 tablet (10 mg total) by mouth daily. Patient not taking: Reported on 11/24/2021 08/02/21 08/02/22  Glendale Chard, MD  Cholecalciferol (VITAMIN D3) 50 MCG (2000 UT) TABS Take 1 tablet by mouth daily at 12 noon.    [provider]  Continuous Blood Gluc Receiver (DEXCOM G6 RECEIVER) DEVI Use to check blood sugar 3 times a day before meals. Dx code e11.65 07/27/21   Glendale Chard, MD  Continuous Blood Gluc Sensor (DEXCOM G6 SENSOR) MISC Use to check blood sugar 3 times a day before meals. Dx code e11.65 07/27/21   Glendale Chard, MD  Ferrous Sulfate (IRON PO) Take 65 mg by mouth daily.    [provider]  fluticasone (FLONASE) 50 MCG/ACT nasal spray Place 2 sprays into both nostrils daily. 10/21/20 11/24/21  [name], FNP  furosemide (LASIX) 20 MG tablet Take 1 tablet (20 mg total) by mouth daily. 11/04/21 10/30/22  Troy Sine, MD  Glucagon (GVOKE HYPOPEN 2-PACK) 0.5 MG/0.1ML SOAJ Inject 0.5 mg into the skin daily as needed. 08/02/21   Glendale Chard, MD  glucose blood (TRUE METRIX BLOOD GLUCOSE TEST) test strip Use as instructed  to check blood sugar 3 times a day. Dx code e11.65 03/08/21   Glendale Chard, MD  hydrALAZINE (APRESOLINE) 50 MG tablet Take 1 tablet (50 mg total) by mouth 2 (two) times daily. 11/04/21   Troy Sine, MD  hydrOXYzine (ATARAX) 25 MG tablet Take 1 tablet (25 mg total) by mouth 3 (three) times daily as needed for itching. 09/04/21   Samuella Cota, MD  insulin degludec (TRESIBA FLEXTOUCH) 200 UNIT/ML FlexTouch Pen Inject 20 Units into the skin daily. 09/16/21   Glendale Chard, MD  insulin lispro (HUMALOG KWIKPEN) 200 UNIT/ML KwikPen inject 8-10 units into THE SKIN 15 minutes BEFORE THREE main meals of THE DAY Patient taking differently: See admin instructions. inject 8-10  units into THE SKIN 15 minutes BEFORE THREE main meals of THE DAY 10/13/21   Philemon Kingdom, MD  Lancets (ACCU-CHEK SOFT [facility]) lancets Check blood sugars 3 times daily E11.65 02/12/18   Glendale Chard, MD  latanoprost (XALATAN) 0.005 % ophthalmic solution Place 1 drop into both eyes at bedtime.  03/17/19   [provider]  metoprolol succinate (TOPROL-XL) 25 MG 24 hr tablet Take 0.5 tablets (12.5 mg total) by mouth daily. 11/04/21   Troy Sine, MD  Multiple Vitamin (MULTIVITAMIN) tablet Take 1 tablet by mouth daily.    [provider]  Semaglutide, 1 MG/DOSE, (OZEMPIC, 1 MG/DOSE,) 2 MG/1.5ML SOPN Inject 1 mg into the skin once a week. 05/20/21   Philemon Kingdom, MD      Allergies    Brimonidine, Dorzolamide, and Timolol    Review of Systems   Review of Systems  Respiratory:  Negative for shortness of breath.   Cardiovascular:  Negative for chest pain.  Gastrointestinal:  Negative for abdominal pain, diarrhea and vomiting.  Musculoskeletal:  Positive for back pain.  Neurological:  Positive for tremors and speech difficulty. Negative for weakness and numbness.    Physical Exam Updated Vital Signs BP (!) 174/59   Pulse 71   Temp 97.8 F (36.6 C)   Resp 15   SpO2 91%  Physical Exam Vitals and nursing note reviewed.  Constitutional:      Appearance: She is well-developed.  HENT:     Head: Normocephalic and atraumatic.  Cardiovascular:     Rate and Rhythm: Normal rate and regular rhythm.     Heart sounds: Normal heart sounds.  Pulmonary:     Effort: Pulmonary effort is normal.     Breath sounds: Normal breath sounds.  Abdominal:     Palpations: Abdomen is soft.     Tenderness: There is no abdominal tenderness.  Musculoskeletal:     Thoracic back: Tenderness (diffuse midline and paraspinal thoracic tenderness) present.  Skin:    General: Skin is warm and dry.  Neurological:     Mental Status: She is alert.     ED Results / Procedures / Treatments    Labs (all labs ordered are listed, but only abnormal results are displayed) Labs Reviewed  BASIC METABOLIC PANEL - Abnormal; Notable for the following components:      Result Value   Potassium 2.9 (*)    Glucose, Bld 114 (*)    BUN 82 (*)    Creatinine, Ser 4.58 (*)    Calcium 8.3 (*)    GFR, Estimated 9 (*)    All other components within normal limits  CBC - Abnormal; Notable for the following components:   RBC 3.28 (*)    Hemoglobin 9.9 (*)    HCT 29.4 (*)  All other components within normal limits  MAGNESIUM - Abnormal; Notable for the following components:   Magnesium 3.1 (*)    All other components within normal limits  CBG MONITORING, ED - Abnormal; Notable for the following components:   Glucose-Capillary 118 (*)    All other components within normal limits  TROPONIN I (HIGH SENSITIVITY) - Abnormal; Notable for the following components:   Troponin I (High Sensitivity) 35 (*)    All other components within normal limits  TROPONIN I (HIGH SENSITIVITY) - Abnormal; Notable for the following components:   Troponin I (High Sensitivity) 37 (*)    All other components within normal limits  URINALYSIS, ROUTINE W REFLEX MICROSCOPIC    EKG EKG Interpretation  Date/Time:  Thursday December 01 2021 20:57:52 EDT Ventricular Rate:  66 PR Interval:  241 QRS Duration: 147 QT Interval:  497 QTC Calculation: 521 R Axis:   -36 Text Interpretation: Sinus rhythm Prolonged PR interval Left bundle branch block similar to Nov 24 2021 Confirmed by Sherwood Gambler [phone]) on 12/01/2021 9:16:30 PM  Radiology CT Head Wo Contrast  Result Date: 12/01/2021 CLINICAL DATA:  Neuro deficit, acute, stroke suspected.  Syncope EXAM: CT HEAD WITHOUT CONTRAST TECHNIQUE: Contiguous axial images were obtained from the base of the skull through the vertex without intravenous contrast. RADIATION DOSE REDUCTION: This exam was performed according to the departmental dose-optimization program which includes  automated exposure control, adjustment of the mA and/or kV according to patient size and/or use of iterative reconstruction technique. COMPARISON:  11/23/2021 FINDINGS: Brain: Normal anatomic configuration. Parenchymal volume loss is commensurate with the patient's age. Mild periventricular white matter changes are present likely reflecting the sequela of small vessel ischemia. Cystic encephalomalacia within the right temporoparietal cortex related to remote cortical infarct is again noted. Remote lacunar infarct within the left putamen again noted. No abnormal intra or extra-axial mass lesion or fluid collection. No abnormal mass effect or midline shift. No evidence of acute intracranial hemorrhage or infarct. Ventricular size is normal. Cerebellum unremarkable. Vascular: No asymmetric hyperdense vasculature at the skull base. Skull: Intact Sinuses/Orbits: Paranasal sinuses are clear. Orbits are unremarkable. Other: Mastoid air cells and middle ear cavities are clear. IMPRESSION: No acute intracranial hemorrhage or infarct. Stable senescent changes and remote infarcts. Electronically Signed   By: Fidela Salisbury M.D.   On: 12/01/2021 22:26   DG Thoracic Spine W/Swimmers  Result Date: 12/01/2021 CLINICAL DATA:  Mid back pain EXAM: THORACIC SPINE - 3 VIEWS COMPARISON:  None Available. FINDINGS: Normal thoracic kyphosis. No acute fracture or listhesis of the thoracic spine. Vertebral body height is preserved. Multilevel endplate changes and disc space narrowing is seen throughout the thoracic spine in keeping with changes of moderate degenerative disc disease, most severe within the midthoracic spine. IMPRESSION: Moderate degenerative disc disease. No acute fracture or listhesis. Electronically Signed   By: Fidela Salisbury M.D.   On: 12/01/2021 22:24   DG Chest 2 View  Result Date: 12/01/2021 CLINICAL DATA:  Syncope, mid back pain EXAM: CHEST - 2 VIEW COMPARISON:  11/24/2021 FINDINGS: Lung volumes are small and  there is bibasilar atelectasis as well as vascular crowding at the hila. No superimposed confluent pulmonary infiltrate. Stable calcified granuloma within the retrocardiac region. No pneumothorax or pleural effusion. Cardiac size within normal limits. Pulmonary vascularity is normal. IMPRESSION: Pulmonary hypoinflation. Electronically Signed   By: Fidela Salisbury M.D.   On: 12/01/2021 22:23    Procedures Procedures    Medications Ordered in ED Medications  sodium  chloride 0.9 % bolus 500 mL (500 mLs Intravenous New Bag/Given 12/01/21 2250)  potassium chloride SA (KLOR-CON M) CR tablet 40 mEq (40 mEq Oral Given 12/01/21 2257)    ED Course/ Medical Decision Making/ A&P                           Medical Decision Making Amount and/or Complexity of Data Reviewed Labs: ordered. Radiology: ordered.  Risk Prescription drug management. Decision regarding hospitalization.   Patient reportedly has had slurred speech but is unclear when this started so not a code stroke.  There is also no focal neurodeficits.  Given she was having bilateral arm symptoms I doubt acute stroke.  Otherwise, this may have more to do with hypokalemia and acute on chronic kidney injury.  She was just here last week for CHF exacerbation and finally started taking her Lasix again and I think she probably over diuresed, resulting in the subsequent kidney injury and likely syncope.  She definitely has multiple comorbidities that think would benefit from admission, observation, and supportive care.  Unclear why she is having this back pain but is very point tender in her mid thoracic and paraspinal back and so I think something like aortic dissection or PE is pretty unlikely.  At this point, will give gentle fluids and hold Lasix.  Discussed with Dr. Josephine Cables for admission.        Final Clinical Impression(s) / ED Diagnoses Final diagnoses:  Acute kidney injury superimposed on chronic kidney disease [facility])    Rx / DC  Orders ED Discharge Orders     None         Sherwood Gambler, MD 12/02/21 0005

## 2021-12-01 NOTE — H&P (Signed)
History and Physical    Patient: [name] [name] PPI:[id number] DOB: May 21, 1940 DOA: 12/01/2021 DOS: the patient was seen and examined on 12/02/2021 PCP: Glendale Chard, MD  Patient coming from: Home  Chief Complaint:  Chief Complaint  Patient presents with   Loss of Consciousness   HPI: [name] [name] is a 81 y.o. female with medical history significant of chronic diastolic CHF, A6TK, stage IV CKD, hypertension, hyperlipidemia who presents to the emergency department due to reported syncopal episode at home this evening.  Patient complained of left-sided upper back pain that has been ongoing for about a week, this worsened within last 3 days.  Daughter checked on patient today's evening and noted that patient was having slurred speech around 6:30 PM, she also presented with bilateral arm tremors, blood glucose was checked and it was normal, this was subsequently followed by patient becoming unconscious for about 2 minutes with immediate recovery and no postictal confusion.  Of note, patient was noted to be eating with hands (instead of cutlery) and this was unusual in patient.  There was no report of headache, fever, chest pain, shortness of breath, nausea, vomiting or abdominal pain.  ED Course:  In the emergency department, BP was 162/54 and other vital signs were within normal range.  Work-up in the ED showed normocytic anemia, glucose 114, BUN/creatinine 82/4.58 (baseline creatinine at 2.9-3.7), eGFR 9.  Magnesium 3.1, troponin x 2 -35 > 37.  Hemoglobin A1c 7.2 CT head without contrast showed no acute intracranial hemorrhage or infarct Thoracic spine x-ray showed moderate degenerative disc disease.  No acute fracture or listhesis Chest x-ray showed pulmonary hyperinflation Potassium was replenished, IV hydration was provided. Hospitalist was asked to admit patient for further evaluation and management.  Review of Systems: Review of systems as noted in the HPI. All other systems  reviewed and are negative.   Past Medical History:  Diagnosis Date   Diabetes mellitus ([place])    H/O: CVA (cerebrovascular accident) 2002   HTN (hypertension)    Hyperlipidemia    Pulmonary HTN ([place])    mild by echo   Past Surgical History:  Procedure Laterality Date   ABDOMINAL HYSTERECTOMY     CARDIAC CATHETERIZATION  03/17/2003   Normal, patent coronary arteries. Normal LV systolic function   CARDIOVASCULAR STRESS TEST  08/28/2006   Small sized, moderate ischemiain the Basal Inferoseptal, Basal Inferior, Mid Inferior and Apical Inferior regions   TRANSTHORACIC ECHOCARDIOGRAM  02/17/2010   EF >55%, moderate-severely thickened septum, mild-moderate mitral annular calcification    Social History:  reports that she has never smoked. She has never used smokeless tobacco. She reports that she does not drink alcohol and does not use drugs.   Allergies  Allergen Reactions   Brimonidine Other (See Comments)    Eyes swell and itch   Dorzolamide     Other reaction(s): Other (See Comments) Red, inflamed eyelid, dermatitis    Timolol Other (See Comments)    Caused eyes to swell and itch    Family History  Problem Relation Age of Onset   Hypertension Mother    Hypertension Father    Heart Problems Father    Hypertension Sister    Diabetes Sister    Hypertension Brother    Diabetes Brother    Breast cancer Neg Hx      Prior to Admission medications   Medication Sig Start Date End Date Taking? Authorizing Provider  ACCU-CHEK AVIVA PLUS test strip TEST THREE TIMES DAILY AS DIRECTED 07/20/20  Glendale Chard, MD  amLODipine (NORVASC) 5 MG tablet Take 1 tablet (5 mg total) by mouth daily. 11/04/21   Troy Sine, MD  aspirin EC 81 MG tablet Take 1 tablet (81 mg total) by mouth daily. Patient taking differently: Take 325 mg by mouth daily. 09/16/21   Troy Sine, MD  atorvastatin (LIPITOR) 20 MG tablet Take 1 tablet (20 mg total) by mouth daily. 11/30/21 11/30/22  Glendale Chard,  MD  azelastine (ASTELIN) 0.1 % nasal spray Place 1 spray into both nostrils 2 (two) times daily. Use in each nostril as directed 09/07/21   Glendale Chard, MD  B-D UF III MINI PEN NEEDLES 31G X 5 MM MISC Use as directed 4x a day 06/29/21   Philemon Kingdom, MD  blood glucose meter kit and supplies Dispense based on patient and insurance preference. Use up to four times daily as directed.dx code e11.65 03/08/21   Glendale Chard, MD  Blood Glucose Monitoring Suppl (TRUE METRIX METER) DEVI Use to check blood sugars 3 times a day. 03/08/21   Glendale Chard, MD  cetirizine (ZYRTEC ALLERGY) 10 MG tablet Take 1 tablet (10 mg total) by mouth daily. Patient not taking: Reported on 11/24/2021 08/02/21 08/02/22  Glendale Chard, MD  Cholecalciferol (VITAMIN D3) 50 MCG (2000 UT) TABS Take 1 tablet by mouth daily at 12 noon.    [provider]  Continuous Blood Gluc Receiver (DEXCOM G6 RECEIVER) DEVI Use to check blood sugar 3 times a day before meals. Dx code e11.65 07/27/21   Glendale Chard, MD  Continuous Blood Gluc Sensor (DEXCOM G6 SENSOR) MISC Use to check blood sugar 3 times a day before meals. Dx code e11.65 07/27/21   Glendale Chard, MD  Ferrous Sulfate (IRON PO) Take 65 mg by mouth daily.    [provider]  fluticasone (FLONASE) 50 MCG/ACT nasal spray Place 2 sprays into both nostrils daily. 10/21/20 11/24/21  [name], FNP  furosemide (LASIX) 20 MG tablet Take 1 tablet (20 mg total) by mouth daily. 11/04/21 10/30/22  Troy Sine, MD  Glucagon (GVOKE HYPOPEN 2-PACK) 0.5 MG/0.1ML SOAJ Inject 0.5 mg into the skin daily as needed. 08/02/21   Glendale Chard, MD  glucose blood (TRUE METRIX BLOOD GLUCOSE TEST) test strip Use as instructed to check blood sugar 3 times a day. Dx code e11.65 03/08/21   Glendale Chard, MD  hydrALAZINE (APRESOLINE) 50 MG tablet Take 1 tablet (50 mg total) by mouth 2 (two) times daily. 11/04/21   Troy Sine, MD  hydrOXYzine (ATARAX) 25 MG tablet Take 1 tablet  (25 mg total) by mouth 3 (three) times daily as needed for itching. 09/04/21   Samuella Cota, MD  insulin degludec (TRESIBA FLEXTOUCH) 200 UNIT/ML FlexTouch Pen Inject 20 Units into the skin daily. 09/16/21   Glendale Chard, MD  insulin lispro (HUMALOG KWIKPEN) 200 UNIT/ML KwikPen inject 8-10 units into THE SKIN 15 minutes BEFORE THREE main meals of THE DAY Patient taking differently: See admin instructions. inject 8-10 units into THE SKIN 15 minutes BEFORE THREE main meals of THE DAY 10/13/21   Philemon Kingdom, MD  Lancets (ACCU-CHEK SOFT [facility]) lancets Check blood sugars 3 times daily E11.65 02/12/18   Glendale Chard, MD  latanoprost (XALATAN) 0.005 % ophthalmic solution Place 1 drop into both eyes at bedtime.  03/17/19   [provider]  metoprolol succinate (TOPROL-XL) 25 MG 24 hr tablet Take 0.5 tablets (12.5 mg total) by mouth daily. 11/04/21   Troy Sine, MD  Multiple Vitamin (MULTIVITAMIN) tablet Take 1 tablet by mouth daily.    [provider]  Semaglutide, 1 MG/DOSE, (OZEMPIC, 1 MG/DOSE,) 2 MG/1.5ML SOPN Inject 1 mg into the skin once a week. 05/20/21   Philemon Kingdom, MD    Physical Exam: BP (!) 159/55 (BP Location: Left Arm)   Pulse 69   Temp 98.6 F (37 C)   Resp 18   SpO2 98%   General: 81 y.o. year-old female well developed well nourished in no acute distress.  Alert and oriented x3. HEENT: NCAT, decreased left-sided peripheral vision (due to prior stroke) Neck: Supple, trachea medial Cardiovascular: Regular rate and rhythm with no rubs or gallops.  No thyromegaly or JVD noted.  No lower extremity edema. 2/4 pulses in all 4 extremities. Respiratory: Clear to auscultation with no wheezes or rales. Good inspiratory effort. Abdomen: Soft, nontender nondistended with normal bowel sounds x4 quadrants. Muskuloskeletal: Left upper back tender to palpation.  No cyanosis or clubbing noted bilaterally Neuro: CN II-XII intact, strength 5/5 x 4, sensation,  reflexes intact Skin: No ulcerative lesions noted or rashes Psychiatry: Judgement and insight appear normal. Mood is appropriate for condition and setting          Labs on Admission:  Basic Metabolic Panel: Recent Labs  Lab 12/01/21 2125  NA 139  K 2.9*  CL 105  CO2 24  GLUCOSE 114*  BUN 82*  CREATININE 4.58*  CALCIUM 8.3*  MG 3.1*   Liver Function Tests: No results for input(s): "AST", "ALT", "ALKPHOS", "BILITOT", "PROT", "ALBUMIN" in the last 168 hours. No results for input(s): "LIPASE", "AMYLASE" in the last 168 hours. No results for input(s): "AMMONIA" in the last 168 hours. CBC: Recent Labs  Lab 12/01/21 2125  WBC 10.3  HGB 9.9*  HCT 29.4*  MCV 89.6  PLT 335   Cardiac Enzymes: No results for input(s): "CKTOTAL", "CKMB", "CKMBINDEX", "TROPONINI" in the last 168 hours.  BNP (last 3 results) Recent Labs    08/30/21 1153 09/02/21 0703 11/23/21 1709  BNP 316.4* 334.0* 330.4*    ProBNP (last 3 results) No results for input(s): "PROBNP" in the last 8760 hours.  CBG: Recent Labs  Lab 12/01/21 2139  GLUCAP 118*    Radiological Exams on Admission: CT Head Wo Contrast  Result Date: 12/01/2021 CLINICAL DATA:  Neuro deficit, acute, stroke suspected.  Syncope EXAM: CT HEAD WITHOUT CONTRAST TECHNIQUE: Contiguous axial images were obtained from the base of the skull through the vertex without intravenous contrast. RADIATION DOSE REDUCTION: This exam was performed according to the departmental dose-optimization program which includes automated exposure control, adjustment of the mA and/or kV according to patient size and/or use of iterative reconstruction technique. COMPARISON:  11/23/2021 FINDINGS: Brain: Normal anatomic configuration. Parenchymal volume loss is commensurate with the patient's age. Mild periventricular white matter changes are present likely reflecting the sequela of small vessel ischemia. Cystic encephalomalacia within the right temporoparietal  cortex related to remote cortical infarct is again noted. Remote lacunar infarct within the left putamen again noted. No abnormal intra or extra-axial mass lesion or fluid collection. No abnormal mass effect or midline shift. No evidence of acute intracranial hemorrhage or infarct. Ventricular size is normal. Cerebellum unremarkable. Vascular: No asymmetric hyperdense vasculature at the skull base. Skull: Intact Sinuses/Orbits: Paranasal sinuses are clear. Orbits are unremarkable. Other: Mastoid air cells and middle ear cavities are clear. IMPRESSION: No acute intracranial hemorrhage or infarct. Stable senescent changes and remote infarcts. Electronically Signed   By: Linwood Dibbles.D.  On: 12/01/2021 22:26   DG Thoracic Spine W/Swimmers  Result Date: 12/01/2021 CLINICAL DATA:  Mid back pain EXAM: THORACIC SPINE - 3 VIEWS COMPARISON:  None Available. FINDINGS: Normal thoracic kyphosis. No acute fracture or listhesis of the thoracic spine. Vertebral body height is preserved. Multilevel endplate changes and disc space narrowing is seen throughout the thoracic spine in keeping with changes of moderate degenerative disc disease, most severe within the midthoracic spine. IMPRESSION: Moderate degenerative disc disease. No acute fracture or listhesis. Electronically Signed   By: Fidela Salisbury M.D.   On: 12/01/2021 22:24   DG Chest 2 View  Result Date: 12/01/2021 CLINICAL DATA:  Syncope, mid back pain EXAM: CHEST - 2 VIEW COMPARISON:  11/24/2021 FINDINGS: Lung volumes are small and there is bibasilar atelectasis as well as vascular crowding at the hila. No superimposed confluent pulmonary infiltrate. Stable calcified granuloma within the retrocardiac region. No pneumothorax or pleural effusion. Cardiac size within normal limits. Pulmonary vascularity is normal. IMPRESSION: Pulmonary hypoinflation. Electronically Signed   By: Fidela Salisbury M.D.   On: 12/01/2021 22:23    EKG: I independently viewed the EKG done  and my findings are as followed: Normal sinus/ectopic atrial rhythm at a rate of 66 bpm with QTc 515 ms  Assessment/Plan Present on Admission: **None**  Principal Problem:   Syncope and collapse Active Problems:   Type 2 diabetes mellitus (HCC)   Essential hypertension   Mixed hyperlipidemia   Hypermagnesemia   Hypokalemia   Acute kidney injury superimposed on chronic kidney disease (HCC)   Prolonged QT interval   Elevated troponin  Syncope and collapse Continue telemetry and watch for arrhythmias Troponins  x 2 -35 > 37. Continue to trend troponin EKG showed normal sinus/ectopic atrial rhythm at a rate of 66 bpm with QTc 515 ms Echocardiogram done on 04/05/2021 showed LVEF of 65 to 70%.  No RWMA.  Moderate LVH.  G1 DD. Echocardiogram will be done to rule out significant aortic stenosis or other outflow obstruction, and also to evaluate EF and to rule out segmental/Regional wall motion abnormalities.  Carotid artery Dopplers will be done to rule out hemodynamically significant stenosis  Slurred speech and bilateral and tremors; rule out acute ischemic stroke vs TIA CT head without contrast showed no acute intracranial hemorrhage or infarct Slurred speech has since resolved and tremor has significantly improved Patient will be admitted to telemetry unit  Bilateral carotid ultrasound in the morning Echocardiogram in the morning MRI of brain without contrast in the morning Continue aspirin and statin Continue fall precautions and neuro checks Hemoglobin A1c on 8/22 was 7.2.  Lipid panel will be checked Continue PT/OT eval and treat Bedside swallow eval by nursing prior to diet Tele neurology will be consulted and we shall await further recommendations  Hypermagnesemia Mg 3.1; continue to monitor magnesium level with morning labs  Hypokalemia K+ 2.9, this was replenished  Acute kidney injury superimposed on CKD BUN/creatinine 82/4.58 (baseline creatinine at 2.9-3.7), eGFR  9. Continue gentle hydration Renally adjust medications, avoid nephrotoxic agents/dehydration/hypotension Nephrologist will be consulted and we shall await further recommendations  Prolonged QT interval QTc 515 ms Avoid QT prolonging drugs Magnesium was 3.1, potassium 2.9 Replenish potassium and monitor for improved magnesium level Continue telemetry  Elevated troponin possibly secondary to type II demand ischemia Troponins  x 2 -35 > 37. Continue to trend troponin  T2DM Hemoglobin A1c checked on 8/22 was 7.2 Continue ISS and hypoglycemic protocol  Essential hypertension Continue Norvasc, metoprolol  Mixed hyperlipidemia  Continue Lipitor   DVT prophylaxis:  heparin subcu  Code Status: Full code  Consults: Nephrology  Family Communication: Daughters at bedside (all questions answered to satisfaction)  Severity of Illness: The appropriate patient status for this patient is OBSERVATION. Observation status is judged to be reasonable and necessary in order to provide the required intensity of service to ensure the patient's safety. The patient's presenting symptoms, physical exam findings, and initial radiographic and laboratory data in the context of their medical condition is felt to place them at decreased risk for further clinical deterioration. Furthermore, it is anticipated that the patient will be medically stable for discharge from the hospital within 2 midnights of admission.   Author: [name], DO 12/02/2021 2:58 AM  For on call review [email or web address].

## 2021-12-02 ENCOUNTER — Observation Stay (HOSPITAL_COMMUNITY): Payer: Medicare Other

## 2021-12-02 ENCOUNTER — Observation Stay (HOSPITAL_BASED_OUTPATIENT_CLINIC_OR_DEPARTMENT_OTHER): Payer: Medicare Other

## 2021-12-02 ENCOUNTER — Observation Stay (HOSPITAL_COMMUNITY)
Admit: 2021-12-02 | Discharge: 2021-12-02 | Disposition: A | Discharge status: Home / Self Care | Payer: Medicare Other | Attending: Internal Medicine | Admitting: Internal Medicine

## 2021-12-02 DIAGNOSIS — N179 Acute kidney failure, unspecified: Secondary | ICD-10-CM | POA: Diagnosis not present

## 2021-12-02 DIAGNOSIS — I10 Essential (primary) hypertension: Secondary | ICD-10-CM | POA: Diagnosis not present

## 2021-12-02 DIAGNOSIS — I6521 Occlusion and stenosis of right carotid artery: Secondary | ICD-10-CM | POA: Diagnosis not present

## 2021-12-02 DIAGNOSIS — R778 Other specified abnormalities of plasma proteins: Secondary | ICD-10-CM

## 2021-12-02 DIAGNOSIS — E876 Hypokalemia: Secondary | ICD-10-CM

## 2021-12-02 DIAGNOSIS — R9431 Abnormal electrocardiogram [ECG] [EKG]: Secondary | ICD-10-CM

## 2021-12-02 DIAGNOSIS — G319 Degenerative disease of nervous system, unspecified: Secondary | ICD-10-CM | POA: Diagnosis not present

## 2021-12-02 DIAGNOSIS — R55 Syncope and collapse: Secondary | ICD-10-CM

## 2021-12-02 DIAGNOSIS — N189 Chronic kidney disease, unspecified: Secondary | ICD-10-CM

## 2021-12-02 HISTORY — DX: Abnormal electrocardiogram (ECG) (EKG): R94.31

## 2021-12-02 HISTORY — DX: Chronic kidney disease, unspecified: N17.9

## 2021-12-02 HISTORY — DX: Hypokalemia: E87.6

## 2021-12-02 HISTORY — DX: Hypermagnesemia: E83.41

## 2021-12-02 LAB — GLUCOSE, CAPILLARY
Glucose-Capillary: 133 mg/dL — ABNORMAL HIGH (ref 70–99)
Glucose-Capillary: 265 mg/dL — ABNORMAL HIGH (ref 70–99)
Glucose-Capillary: 370 mg/dL — ABNORMAL HIGH (ref 70–99)
Glucose-Capillary: 363 mg/dL — ABNORMAL HIGH (ref 70–99)

## 2021-12-02 LAB — BASIC METABOLIC PANEL
Anion gap: 9 (ref 5–15)
BUN: 75 mg/dL — ABNORMAL HIGH (ref 8–23)
CO2: 21 mmol/L — ABNORMAL LOW (ref 22–32)
Calcium: 8.2 mg/dL — ABNORMAL LOW (ref 8.9–10.3)
Chloride: 106 mmol/L (ref 98–111)
Creatinine, Ser: 4.04 mg/dL — ABNORMAL HIGH (ref 0.44–1.00)
GFR, Estimated: 10 mL/min — ABNORMAL LOW (ref >60)
Glucose, Bld: 363 mg/dL — ABNORMAL HIGH (ref 70–99)
Potassium: 3.6 mmol/L (ref 3.5–5.1)
Sodium: 136 mmol/L (ref 135–145)

## 2021-12-02 LAB — COMPREHENSIVE METABOLIC PANEL
ALT: 11 U/L (ref 0–44)
AST: 14 U/L — ABNORMAL LOW (ref 15–41)
Albumin: 2.6 g/dL — ABNORMAL LOW (ref 3.5–5.0)
Alkaline Phosphatase: 65 U/L (ref 38–126)
Anion gap: 7 (ref 5–15)
BUN: 78 mg/dL — ABNORMAL HIGH (ref 8–23)
CO2: 23 mmol/L (ref 22–32)
Calcium: 7.9 mg/dL — ABNORMAL LOW (ref 8.9–10.3)
Chloride: 110 mmol/L (ref 98–111)
Creatinine, Ser: 4.26 mg/dL — ABNORMAL HIGH (ref 0.44–1.00)
GFR, Estimated: 10 mL/min — ABNORMAL LOW (ref >60)
Glucose, Bld: 134 mg/dL — ABNORMAL HIGH (ref 70–99)
Potassium: 3 mmol/L — ABNORMAL LOW (ref 3.5–5.1)
Sodium: 140 mmol/L (ref 135–145)
Total Bilirubin: 0.5 mg/dL (ref 0.3–1.2)
Total Protein: 5.7 g/dL — ABNORMAL LOW (ref 6.5–8.1)

## 2021-12-02 LAB — CBC
HCT: 26.1 % — ABNORMAL LOW (ref 36.0–46.0)
Hemoglobin: 8.7 g/dL — ABNORMAL LOW (ref 12.0–15.0)
MCH: 30.2 pg (ref 26.0–34.0)
MCHC: 33.3 g/dL (ref 30.0–36.0)
MCV: 90.6 fL (ref 80.0–100.0)
Platelets: 296 10*3/uL (ref 150–400)
RBC: 2.88 MIL/uL — ABNORMAL LOW (ref 3.87–5.11)
RDW: 13.3 % (ref 11.5–15.5)
WBC: 11.6 10*3/uL — ABNORMAL HIGH (ref 4.0–10.5)
nRBC: 0 % (ref 0.0–0.2)

## 2021-12-02 LAB — ECHOCARDIOGRAM COMPLETE
AR max vel: 2.37 cm2
AV Area VTI: 2.64 cm2
AV Area mean vel: 2.4 cm2
AV Mean grad: 6 mmHg
AV Peak grad: 12.1 mmHg
Ao pk vel: 1.74 m/s
Area-P 1/2: 3.07 cm2
Calc EF: 70.3 %
MV VTI: 2.02 cm2
S' Lateral: 2.5 cm
Single Plane A2C EF: 71.7 %
Single Plane A4C EF: 68.6 %

## 2021-12-02 LAB — TROPONIN I (HIGH SENSITIVITY)
Troponin I (High Sensitivity): 35 ng/L — ABNORMAL HIGH (ref <18)
Troponin I (High Sensitivity): 37 ng/L — ABNORMAL HIGH (ref <18)

## 2021-12-02 LAB — MAGNESIUM: Magnesium: 2.8 mg/dL — ABNORMAL HIGH (ref 1.7–2.4)

## 2021-12-02 LAB — APTT: aPTT: 32 seconds (ref 24–36)

## 2021-12-02 LAB — PHOSPHORUS: Phosphorus: 5.7 mg/dL — ABNORMAL HIGH (ref 2.5–4.6)

## 2021-12-02 MED ORDER — LIDOCAINE 5 % EX PTCH
1.0000 | MEDICATED_PATCH | Freq: Once | CUTANEOUS | Status: AC
  Administered 2021-12-02: 1 via TRANSDERMAL
  Filled 2021-12-02: qty 1

## 2021-12-02 MED ORDER — INSULIN ASPART 100 UNIT/ML IJ SOLN
0.0000 [IU] | Freq: Every day | INTRAMUSCULAR | Status: DC
  Administered 2021-12-02: 3 [IU] via SUBCUTANEOUS

## 2021-12-02 MED ORDER — ACETAMINOPHEN 325 MG PO TABS
650.0000 mg | ORAL_TABLET | Freq: Four times a day (QID) | ORAL | Status: DC | PRN
  Administered 2021-12-02 – 2021-12-03 (×4): 650 mg via ORAL
  Filled 2021-12-02 (×4): qty 2

## 2021-12-02 MED ORDER — SODIUM CHLORIDE 0.9 % IV SOLN: INTRAVENOUS | Status: DC

## 2021-12-02 MED ORDER — POTASSIUM CHLORIDE CRYS ER 20 MEQ PO TBCR: 40.0000 meq | EXTENDED_RELEASE_TABLET | Freq: Two times a day (BID) | ORAL | Status: DC

## 2021-12-02 MED ORDER — METOPROLOL SUCCINATE ER 25 MG PO TB24
12.5000 mg | ORAL_TABLET | Freq: Every day | ORAL | Status: DC
  Administered 2021-12-02 – 2021-12-03 (×2): 12.5 mg via ORAL
  Filled 2021-12-02 (×3): qty 1

## 2021-12-02 MED ORDER — POTASSIUM CHLORIDE CRYS ER 20 MEQ PO TBCR
40.0000 meq | EXTENDED_RELEASE_TABLET | Freq: Two times a day (BID) | ORAL | Status: DC
  Administered 2021-12-02: 40 meq via ORAL
  Filled 2021-12-02: qty 2

## 2021-12-02 MED ORDER — ASPIRIN 81 MG PO TBEC
81.0000 mg | DELAYED_RELEASE_TABLET | Freq: Every day | ORAL | Status: DC
  Administered 2021-12-02 – 2021-12-03 (×2): 81 mg via ORAL
  Filled 2021-12-02 (×2): qty 1

## 2021-12-02 MED ORDER — INSULIN ASPART 100 UNIT/ML IJ SOLN
0.0000 [IU] | Freq: Three times a day (TID) | INTRAMUSCULAR | Status: DC
  Administered 2021-12-02: 5 [IU] via SUBCUTANEOUS
  Administered 2021-12-02 – 2021-12-03 (×3): 3 [IU] via SUBCUTANEOUS

## 2021-12-02 MED ORDER — AMLODIPINE BESYLATE 5 MG PO TABS
5.0000 mg | ORAL_TABLET | Freq: Every day | ORAL | Status: DC
  Administered 2021-12-02 – 2021-12-03 (×2): 5 mg via ORAL
  Filled 2021-12-02 (×3): qty 1

## 2021-12-02 MED ORDER — VITAMIN D 25 MCG (1000 UNIT) PO TABS
2000.0000 [IU] | ORAL_TABLET | Freq: Every day | ORAL | Status: DC
Start: 2021-12-02 — End: 2021-12-03
  Administered 2021-12-02 – 2021-12-03 (×2): 2000 [IU] via ORAL
  Filled 2021-12-02 (×2): qty 2

## 2021-12-02 MED ORDER — ATORVASTATIN CALCIUM 20 MG PO TABS
20.0000 mg | ORAL_TABLET | Freq: Every day | ORAL | Status: DC
  Administered 2021-12-02 – 2021-12-03 (×2): 20 mg via ORAL
  Filled 2021-12-02 (×2): qty 1

## 2021-12-02 MED ORDER — SODIUM CHLORIDE 0.9 % IV SOLN: INTRAVENOUS | Status: AC

## 2021-12-02 MED ORDER — ADULT MULTIVITAMIN W/MINERALS CH
1.0000 | ORAL_TABLET | Freq: Every day | ORAL | Status: DC
Start: 2021-12-02 — End: 2021-12-03
  Administered 2021-12-02 – 2021-12-03 (×2): 1 via ORAL
  Filled 2021-12-02 (×2): qty 1

## 2021-12-02 MED ORDER — INSULIN ASPART 100 UNIT/ML IJ SOLN: 0.0000 [IU] | Freq: Three times a day (TID) | INTRAMUSCULAR | Status: DC

## 2021-12-02 NOTE — ED Provider Notes (Incomplete)
[facility] EMERGENCY DEPARTMENT Provider Note   CSN: [id number] Arrival date & time: 12/01/21  2044     History {Add pertinent medical, surgical, social history, OB history to HPI:1} Chief Complaint  Patient presents with  . Loss of Consciousness    [name] [name] is a 81 y.o. female.  HPI 81 year old female presents with syncope.  History is primarily from the daughter.  Patient has been complaining of thoracic back pain for about a week.  No trauma.  It has been worse over the last 3 days.  No chest pain, shortness of breath.  However when the daughter went over to check on her, she was having slurred speech at 6:30 PM.  It is unknown how long she was having it before.  She also seem like she was having bilateral arm shaking.  Her glucose was okay.  At some point after dinner she passed out.  She felt acutely lightheaded. Before this, patient was eating with her hands, which the daughter states is not normal for her and her arms still seem to be shaking.  Home Medications Prior to Admission medications   Medication Sig Start Date End Date Taking? Authorizing Provider  ACCU-CHEK AVIVA PLUS test strip TEST THREE TIMES DAILY AS DIRECTED 07/20/20   Glendale Chard, MD  amLODipine (NORVASC) 5 MG tablet Take 1 tablet (5 mg total) by mouth daily. 11/04/21   Troy Sine, MD  aspirin EC 81 MG tablet Take 1 tablet (81 mg total) by mouth daily. Patient taking differently: Take 325 mg by mouth daily. 09/16/21   Troy Sine, MD  atorvastatin (LIPITOR) 20 MG tablet Take 1 tablet (20 mg total) by mouth daily. 11/30/21 11/30/22  Glendale Chard, MD  azelastine (ASTELIN) 0.1 % nasal spray Place 1 spray into both nostrils 2 (two) times daily. Use in each nostril as directed 09/07/21   Glendale Chard, MD  B-D UF III MINI PEN NEEDLES 31G X 5 MM MISC Use as directed 4x a day 06/29/21   Philemon Kingdom, MD  blood glucose meter kit and supplies Dispense based on patient and insurance preference. Use up  to four times daily as directed.dx code e11.65 03/08/21   Glendale Chard, MD  Blood Glucose Monitoring Suppl (TRUE METRIX METER) DEVI Use to check blood sugars 3 times a day. 03/08/21   Glendale Chard, MD  cetirizine (ZYRTEC ALLERGY) 10 MG tablet Take 1 tablet (10 mg total) by mouth daily. Patient not taking: Reported on 11/24/2021 08/02/21 08/02/22  Glendale Chard, MD  Cholecalciferol (VITAMIN D3) 50 MCG (2000 UT) TABS Take 1 tablet by mouth daily at 12 noon.    [provider]  Continuous Blood Gluc Receiver (DEXCOM G6 RECEIVER) DEVI Use to check blood sugar 3 times a day before meals. Dx code e11.65 07/27/21   Glendale Chard, MD  Continuous Blood Gluc Sensor (DEXCOM G6 SENSOR) MISC Use to check blood sugar 3 times a day before meals. Dx code e11.65 07/27/21   Glendale Chard, MD  Ferrous Sulfate (IRON PO) Take 65 mg by mouth daily.    [provider]  fluticasone (FLONASE) 50 MCG/ACT nasal spray Place 2 sprays into both nostrils daily. 10/21/20 11/24/21  [name], FNP  furosemide (LASIX) 20 MG tablet Take 1 tablet (20 mg total) by mouth daily. 11/04/21 10/30/22  Troy Sine, MD  Glucagon (GVOKE HYPOPEN 2-PACK) 0.5 MG/0.1ML SOAJ Inject 0.5 mg into the skin daily as needed. 08/02/21   Glendale Chard, MD  glucose blood (TRUE  METRIX BLOOD GLUCOSE TEST) test strip Use as instructed to check blood sugar 3 times a day. Dx code e11.65 03/08/21   Glendale Chard, MD  hydrALAZINE (APRESOLINE) 50 MG tablet Take 1 tablet (50 mg total) by mouth 2 (two) times daily. 11/04/21   Troy Sine, MD  hydrOXYzine (ATARAX) 25 MG tablet Take 1 tablet (25 mg total) by mouth 3 (three) times daily as needed for itching. 09/04/21   Samuella Cota, MD  insulin degludec (TRESIBA FLEXTOUCH) 200 UNIT/ML FlexTouch Pen Inject 20 Units into the skin daily. 09/16/21   Glendale Chard, MD  insulin lispro (HUMALOG KWIKPEN) 200 UNIT/ML KwikPen inject 8-10 units into THE SKIN 15 minutes BEFORE THREE main meals of THE  DAY Patient taking differently: See admin instructions. inject 8-10 units into THE SKIN 15 minutes BEFORE THREE main meals of THE DAY 10/13/21   Philemon Kingdom, MD  Lancets (ACCU-CHEK SOFT [facility]) lancets Check blood sugars 3 times daily E11.65 02/12/18   Glendale Chard, MD  latanoprost (XALATAN) 0.005 % ophthalmic solution Place 1 drop into both eyes at bedtime.  03/17/19   [provider]  metoprolol succinate (TOPROL-XL) 25 MG 24 hr tablet Take 0.5 tablets (12.5 mg total) by mouth daily. 11/04/21   Troy Sine, MD  Multiple Vitamin (MULTIVITAMIN) tablet Take 1 tablet by mouth daily.    [provider]  Semaglutide, 1 MG/DOSE, (OZEMPIC, 1 MG/DOSE,) 2 MG/1.5ML SOPN Inject 1 mg into the skin once a week. 05/20/21   Philemon Kingdom, MD      Allergies    Brimonidine, Dorzolamide, and Timolol    Review of Systems   Review of Systems  Physical Exam Updated Vital Signs BP (!) 174/59   Pulse 71   Temp 97.8 F (36.6 C)   Resp 15   SpO2 91%  Physical Exam  ED Results / Procedures / Treatments   Labs (all labs ordered are listed, but only abnormal results are displayed) Labs Reviewed  BASIC METABOLIC PANEL - Abnormal; Notable for the following components:      Result Value   Potassium 2.9 (*)    Glucose, Bld 114 (*)    BUN 82 (*)    Creatinine, Ser 4.58 (*)    Calcium 8.3 (*)    GFR, Estimated 9 (*)    All other components within normal limits  CBC - Abnormal; Notable for the following components:   RBC 3.28 (*)    Hemoglobin 9.9 (*)    HCT 29.4 (*)    All other components within normal limits  MAGNESIUM - Abnormal; Notable for the following components:   Magnesium 3.1 (*)    All other components within normal limits  CBG MONITORING, ED - Abnormal; Notable for the following components:   Glucose-Capillary 118 (*)    All other components within normal limits  TROPONIN I (HIGH SENSITIVITY) - Abnormal; Notable for the following components:   Troponin I (High  Sensitivity) 35 (*)    All other components within normal limits  TROPONIN I (HIGH SENSITIVITY) - Abnormal; Notable for the following components:   Troponin I (High Sensitivity) 37 (*)    All other components within normal limits  URINALYSIS, ROUTINE W REFLEX MICROSCOPIC    EKG EKG Interpretation  Date/Time:  Thursday December 01 2021 20:57:52 EDT Ventricular Rate:  66 PR Interval:  241 QRS Duration: 147 QT Interval:  497 QTC Calculation: 521 R Axis:   -36 Text Interpretation: Sinus rhythm Prolonged PR interval Left bundle branch  block similar to Nov 24 2021 Confirmed by Sherwood Gambler [phone]) on 12/01/2021 9:16:30 PM  Radiology CT Head Wo Contrast  Result Date: 12/01/2021 CLINICAL DATA:  Neuro deficit, acute, stroke suspected.  Syncope EXAM: CT HEAD WITHOUT CONTRAST TECHNIQUE: Contiguous axial images were obtained from the base of the skull through the vertex without intravenous contrast. RADIATION DOSE REDUCTION: This exam was performed according to the departmental dose-optimization program which includes automated exposure control, adjustment of the mA and/or kV according to patient size and/or use of iterative reconstruction technique. COMPARISON:  11/23/2021 FINDINGS: Brain: Normal anatomic configuration. Parenchymal volume loss is commensurate with the patient's age. Mild periventricular white matter changes are present likely reflecting the sequela of small vessel ischemia. Cystic encephalomalacia within the right temporoparietal cortex related to remote cortical infarct is again noted. Remote lacunar infarct within the left putamen again noted. No abnormal intra or extra-axial mass lesion or fluid collection. No abnormal mass effect or midline shift. No evidence of acute intracranial hemorrhage or infarct. Ventricular size is normal. Cerebellum unremarkable. Vascular: No asymmetric hyperdense vasculature at the skull base. Skull: Intact Sinuses/Orbits: Paranasal sinuses are clear.  Orbits are unremarkable. Other: Mastoid air cells and middle ear cavities are clear. IMPRESSION: No acute intracranial hemorrhage or infarct. Stable senescent changes and remote infarcts. Electronically Signed   By: Fidela Salisbury M.D.   On: 12/01/2021 22:26   DG Thoracic Spine W/Swimmers  Result Date: 12/01/2021 CLINICAL DATA:  Mid back pain EXAM: THORACIC SPINE - 3 VIEWS COMPARISON:  None Available. FINDINGS: Normal thoracic kyphosis. No acute fracture or listhesis of the thoracic spine. Vertebral body height is preserved. Multilevel endplate changes and disc space narrowing is seen throughout the thoracic spine in keeping with changes of moderate degenerative disc disease, most severe within the midthoracic spine. IMPRESSION: Moderate degenerative disc disease. No acute fracture or listhesis. Electronically Signed   By: Fidela Salisbury M.D.   On: 12/01/2021 22:24   DG Chest 2 View  Result Date: 12/01/2021 CLINICAL DATA:  Syncope, mid back pain EXAM: CHEST - 2 VIEW COMPARISON:  11/24/2021 FINDINGS: Lung volumes are small and there is bibasilar atelectasis as well as vascular crowding at the hila. No superimposed confluent pulmonary infiltrate. Stable calcified granuloma within the retrocardiac region. No pneumothorax or pleural effusion. Cardiac size within normal limits. Pulmonary vascularity is normal. IMPRESSION: Pulmonary hypoinflation. Electronically Signed   By: Fidela Salisbury M.D.   On: 12/01/2021 22:23    Procedures Procedures  {Document cardiac monitor, telemetry assessment procedure when appropriate:1}  Medications Ordered in ED Medications  sodium chloride 0.9 % bolus 500 mL (500 mLs Intravenous New Bag/Given 12/01/21 2250)  potassium chloride SA (KLOR-CON M) CR tablet 40 mEq (40 mEq Oral Given 12/01/21 2257)    ED Course/ Medical Decision Making/ A&P                           Medical Decision Making Amount and/or Complexity of Data Reviewed Labs: ordered. Radiology:  ordered.  Risk Prescription drug management. Decision regarding hospitalization.   ***  {Document critical care time when appropriate:1} {Document review of labs and clinical decision tools ie heart score, Chads2Vasc2 etc:1}  {Document your independent review of radiology images, and any outside records:1} {Document your discussion with family members, caretakers, and with consultants:1} {Document social determinants of health affecting pt's care:1} {Document your decision making why or why not admission, treatments were needed:1} Final Clinical Impression(s) / ED Diagnoses Final diagnoses:  Acute kidney injury superimposed on chronic kidney disease ([place])    Rx / DC Orders ED Discharge Orders     None

## 2021-12-02 NOTE — Procedures (Signed)
Patient Name: [name] [name]  MRN: [id number]  Epilepsy Attending: Lora Havens  Referring Physician/Provider: Hosie Poisson, MD  Date: 12/02/2021 Duration: 25.21 mins  Patient history: 81yo F with syncope. EEG to evaluate for seizure.  Level of alertness: Awake, asleep  AEDs during EEG study: None  Technical aspects: This EEG study was done with scalp electrodes positioned according to the 10-20 International system of electrode placement. Electrical activity was reviewed with band pass filter of 1-70Hz , sensitivity of 7 uV/mm, display speed of 18mm/sec with a 60Hz  notched filter applied as appropriate. EEG data were recorded continuously and digitally stored.  Video monitoring was available and reviewed as appropriate.  Description: The posterior dominant rhythm consists of 8 Hz activity of moderate voltage (25-35 uV) seen predominantly in posterior head regions, symmetric and reactive to eye opening and eye closing. Sleep was characterized by vertex waves, sleep spindles (12 to 14 Hz), maximal frontocentral region. Hyperventilation and photic stimulation were not performed.     IMPRESSION: This study is within normal limits. No seizures or epileptiform discharges were seen throughout the recording.  A normal interictal EEG does not exclude nor support the diagnosis of epilepsy.   [name] [name] [name]

## 2021-12-02 NOTE — Progress Notes (Signed)
EEG complete - results pending 

## 2021-12-02 NOTE — Plan of Care (Signed)
  Problem: Acute Rehab PT Goals(only PT should resolve) Goal: Patient Will Transfer Sit To/From Stand Outcome: Progressing Flowsheets (Taken 12/02/2021 0941) Patient will transfer sit to/from stand:  with supervision  with modified independence Goal: Pt Will Transfer Bed To Chair/Chair To Bed Outcome: Progressing Flowsheets (Taken 12/02/2021 0941) Pt will Transfer Bed to Chair/Chair to Bed:  with supervision  with modified independence Goal: Pt Will Ambulate Outcome: Progressing Flowsheets (Taken 12/02/2021 0941) Pt will Ambulate:  50 feet  with supervision  with min guard assist  with least restrictive assistive device Goal: Pt/caregiver will Perform Home Exercise Program Outcome: Progressing Flowsheets (Taken 12/02/2021 0941) Pt/caregiver will Perform Home Exercise Program:  For increased strengthening  For improved balance  Independently  9:42 AM, 12/02/21 Mearl Latin PT, DPT Physical Therapist at [facility]

## 2021-12-02 NOTE — Progress Notes (Signed)
PROGRESS NOTE    [name] [name]  ZOX:[id number] DOB: 1940-07-21 DOA: 12/01/2021 PCP: Glendale Chard, MD    Chief Complaint  Patient presents with   Loss of Consciousness    Brief Narrative:  [name] [name] is a 81 y.o. female with medical history significant of chronic diastolic CHF, W1XB, stage IV CKD, hypertension, hyperlipidemia who presents to the emergency department due to reported syncopal episode at home this evening.  Patient complained of left-sided upper back pain that has been ongoing for about a week, this worsened within last 3 days.  Daughter checked on patient today's evening and noted that patient was having slurred speech around 6:30 PM, she also presented with bilateral arm tremors, blood glucose was checked and it was normal, this was subsequently followed by patient becoming unconscious for about 2 minutes with immediate recovery and no postictal confusion.  CT head without contrast showed no acute intracranial hemorrhage or infarct Thoracic spine x-ray showed moderate degenerative disc disease.  No acute fracture or listhesis Chest x-ray showed pulmonary hyperinflation Hospitalist was asked to admit patient for further evaluation and management. MRI brain with out contrast showed No acute intracranial abnormality. Chronic posterior right cerebral infarct. Mild chronic small vessel ischemic disease. US carotid duplex is negative for significant stenosis.    Assessment & Plan:   Principal Problem:   Syncope and collapse Active Problems:   Type 2 diabetes mellitus (HCC)   Essential hypertension   Mixed hyperlipidemia   Hypermagnesemia   Hypokalemia   Acute kidney injury superimposed on chronic kidney disease (HCC)   Prolonged QT interval   Elevated troponin   AKI superimposed on stage IV CKD Creatinine at baseline appears to be between 2.5-3.5. She was admitted with creatinine of 4.5 has improved to 4.26 today. Monitor strict intake and output. Check  urine analysis Will request nephrology consult in a.m. depending on the labs    Hypokalemia Replaced Magnesium level is 2.8.     Anemia of chronic disease Probably secondary to stage IV CKD.    Syncope Unclear etiology suspect from dehydration and orthostatic hypotension Patient currently denies any dizziness during therapy. MRI of the brain without contrast is negative for acute stroke Carotid duplex is unremarkable.  EEG ordered .   DVT prophylaxis: Heparin Code Status: full code Family Communication: discussed with daughter on the phone Disposition:   Status is: Observation The patient will require care spanning > 2 midnights and should be moved to inpatient because: AKI. SYNCOPE work up.    Level of care: Telemetry Consultants:  None.   Procedures: MRI brain without contrast.  Antimicrobials: none.    Subjective: Denies chest pain or sob.   Objective: Vitals:   12/02/21 0032 12/02/21 0531 12/02/21 0819 12/02/21 1215  BP: (!) 159/55 (!) 174/54 (!) 169/58 (!) 172/64  Pulse: 69 66 65 71  Resp: 18 17  18   Temp: 98.6 F (37 C) 98.4 F (36.9 C)  98.2 F (36.8 C)  TempSrc:  Oral  Oral  SpO2: 98% 96%  94%    Intake/Output Summary (Last 24 hours) at 12/02/2021 1324 Last data filed at 12/02/2021 0900 Gross per 24 hour  Intake 247.58 ml  Output --  Net 247.58 ml   There were no vitals filed for this visit.  Examination:  General exam: Appears calm and comfortable  Respiratory system: Clear to auscultation. Respiratory effort normal. Cardiovascular system: S1 & S2 heard, RRR. No JVD,  No pedal edema. Gastrointestinal system: Abdomen is nondistended, soft  and nontender.  Normal bowel sounds heard. Central nervous system: Alert and oriented. No focal neurological deficits. Extremities: Symmetric 5 x 5 power. Skin: No rashes, lesions or ulcers Psychiatry:  Mood & affect appropriate.     Data Reviewed: I have personally reviewed following labs and  imaging studies  CBC: Recent Labs  Lab 12/01/21 2125 12/02/21 0342  WBC 10.3 11.6*  HGB 9.9* 8.7*  HCT 29.4* 26.1*  MCV 89.6 90.6  PLT 335 536    Basic Metabolic Panel: Recent Labs  Lab 12/01/21 2125 12/02/21 0342  NA 139 140  K 2.9* 3.0*  CL 105 110  CO2 24 23  GLUCOSE 114* 134*  BUN 82* 78*  CREATININE 4.58* 4.26*  CALCIUM 8.3* 7.9*  MG 3.1* 2.8*  PHOS  --  5.7*    GFR: Estimated Creatinine Clearance: 9.4 mL/min (A) (by C-G formula based on SCr of 4.26 mg/dL (H)).  Liver Function Tests: Recent Labs  Lab 12/02/21 0342  AST 14*  ALT 11  ALKPHOS 65  BILITOT 0.5  PROT 5.7*  ALBUMIN 2.6*    CBG: Recent Labs  Lab 12/01/21 2139 12/02/21 0807 12/02/21 1141  GLUCAP 118* 133* 265*     No results found for this or any previous visit (from the past 240 hour(s)).       Radiology Studies: US Carotid Bilateral  Result Date: 12/02/2021 CLINICAL DATA:  Syncope and collapse EXAM: BILATERAL CAROTID DUPLEX ULTRASOUND TECHNIQUE: Pearline Cables scale imaging, color Doppler and duplex ultrasound were performed of bilateral carotid and vertebral arteries in the neck. COMPARISON:  None available FINDINGS: Criteria: Quantification of carotid stenosis is based on velocity parameters that correlate the residual internal carotid diameter with NASCET-based stenosis levels, using the diameter of the distal internal carotid lumen as the denominator for stenosis measurement. The following velocity measurements were obtained: RIGHT ICA: 75/20 cm/sec CCA: 14/4 cm/sec SYSTOLIC ICA/CCA RATIO:  1.2 ECA: 90 cm/sec LEFT ICA: 67/18 cm/sec CCA: 31/5 cm/sec SYSTOLIC ICA/CCA RATIO:  0.8 ECA: 82 cm/sec RIGHT CAROTID ARTERY: Mild calcified plaque of the carotid bifurcation. RIGHT VERTEBRAL ARTERY:  Antegrade flow. LEFT CAROTID ARTERY:  No significant atheromatous plaque. LEFT VERTEBRAL ARTERY:  Antegrade flow. IMPRESSION: No significant stenosis of internal carotid arteries. Electronically Signed   By:  Miachel Roux M.D.   On: 12/02/2021 11:49   MR BRAIN WO CONTRAST  Result Date: 12/02/2021 CLINICAL DATA:  Neuro deficit, acute, stroke suspected.  Syncope. EXAM: MRI HEAD WITHOUT CONTRAST TECHNIQUE: Multiplanar, multiecho pulse sequences of the brain and surrounding structures were obtained without intravenous contrast. COMPARISON:  Head CT 12/01/2021 and MRI 02/12/2019 FINDINGS: Brain: There is no evidence of an acute infarct, intracranial hemorrhage, mass, midline shift, or extra-axial fluid collection. A moderately large region of encephalomalacia is again noted involving the right parietal, temporal, and occipital lobes with associated ex vacuo dilatation of the right lateral ventricle. Small T2 hyperintensities elsewhere in the cerebral white matter bilaterally are stable to minimally increased compared to the prior MRI and are nonspecific but compatible with mild chronic small vessel ischemic disease. A chronic lacunar infarct in the left lentiform nucleus is new from the prior MRI. There is mild global cerebral atrophy. Vascular: Major intracranial vascular flow voids are preserved. Skull and upper cervical spine: Diffusely diminished bone marrow T1 signal intensity likely related to known anemia. Sinuses/Orbits: Unremarkable orbits. Paranasal sinuses and mastoid air cells are clear. Other: None. IMPRESSION: 1. No acute intracranial abnormality. 2. Chronic posterior right cerebral infarct. 3. Mild chronic small  vessel ischemic disease. Electronically Signed   By: Logan Bores M.D.   On: 12/02/2021 10:23   CT Head Wo Contrast  Result Date: 12/01/2021 CLINICAL DATA:  Neuro deficit, acute, stroke suspected.  Syncope EXAM: CT HEAD WITHOUT CONTRAST TECHNIQUE: Contiguous axial images were obtained from the base of the skull through the vertex without intravenous contrast. RADIATION DOSE REDUCTION: This exam was performed according to the departmental dose-optimization program which includes automated  exposure control, adjustment of the mA and/or kV according to patient size and/or use of iterative reconstruction technique. COMPARISON:  11/23/2021 FINDINGS: Brain: Normal anatomic configuration. Parenchymal volume loss is commensurate with the patient's age. Mild periventricular white matter changes are present likely reflecting the sequela of small vessel ischemia. Cystic encephalomalacia within the right temporoparietal cortex related to remote cortical infarct is again noted. Remote lacunar infarct within the left putamen again noted. No abnormal intra or extra-axial mass lesion or fluid collection. No abnormal mass effect or midline shift. No evidence of acute intracranial hemorrhage or infarct. Ventricular size is normal. Cerebellum unremarkable. Vascular: No asymmetric hyperdense vasculature at the skull base. Skull: Intact Sinuses/Orbits: Paranasal sinuses are clear. Orbits are unremarkable. Other: Mastoid air cells and middle ear cavities are clear. IMPRESSION: No acute intracranial hemorrhage or infarct. Stable senescent changes and remote infarcts. Electronically Signed   By: Fidela Salisbury M.D.   On: 12/01/2021 22:26   DG Thoracic Spine W/Swimmers  Result Date: 12/01/2021 CLINICAL DATA:  Mid back pain EXAM: THORACIC SPINE - 3 VIEWS COMPARISON:  None Available. FINDINGS: Normal thoracic kyphosis. No acute fracture or listhesis of the thoracic spine. Vertebral body height is preserved. Multilevel endplate changes and disc space narrowing is seen throughout the thoracic spine in keeping with changes of moderate degenerative disc disease, most severe within the midthoracic spine. IMPRESSION: Moderate degenerative disc disease. No acute fracture or listhesis. Electronically Signed   By: Fidela Salisbury M.D.   On: 12/01/2021 22:24   DG Chest 2 View  Result Date: 12/01/2021 CLINICAL DATA:  Syncope, mid back pain EXAM: CHEST - 2 VIEW COMPARISON:  11/24/2021 FINDINGS: Lung volumes are small and there is  bibasilar atelectasis as well as vascular crowding at the hila. No superimposed confluent pulmonary infiltrate. Stable calcified granuloma within the retrocardiac region. No pneumothorax or pleural effusion. Cardiac size within normal limits. Pulmonary vascularity is normal. IMPRESSION: Pulmonary hypoinflation. Electronically Signed   By: Fidela Salisbury M.D.   On: 12/01/2021 22:23        Scheduled Meds:  amLODipine  5 mg Oral Daily   aspirin EC  81 mg Oral Daily   atorvastatin  20 mg Oral Daily   cholecalciferol  2,000 Units Oral Q1200   insulin aspart  0-6 Units Subcutaneous TID WC   metoprolol succinate  12.5 mg Oral Daily   multivitamin with minerals  1 tablet Oral Daily   potassium chloride  40 mEq Oral BID   Continuous Infusions:   LOS: 0 days    Time spent: 40 minutes.     Hosie Poisson, MD [facility]   To contact the attending provider between 7A-7P or the covering provider during after hours 7P-7A, please log into the web site [email or web address] and access using universal [place] password for that web site. If you do not have the password, please call the hospital operator.  12/02/2021, 1:24 PM

## 2021-12-02 NOTE — Plan of Care (Signed)
  Problem: Education: Goal: Knowledge of General Education information will improve Description: Including pain rating scale, medication(s)/side effects and non-pharmacologic comfort measures Outcome: Progressing   Problem: Health Behavior/Discharge Planning: Goal: Ability to manage health-related needs will improve Outcome: Progressing   Problem: Clinical Measurements: Goal: Ability to maintain clinical measurements within normal limits will improve Outcome: Progressing Goal: Will remain free from infection Outcome: Progressing Goal: Diagnostic test results will improve Outcome: Progressing Goal: Respiratory complications will improve Outcome: Not Applicable Goal: Cardiovascular complication will be avoided Outcome: Progressing

## 2021-12-02 NOTE — Progress Notes (Signed)
Patient transported to radiology for scheduled MRI. Patient had a glucose pod on her arm which had to be removed to have MRI. Patient agreed to have the glucose pod removed and the lady in radiology provided patient with a contact number to get her glucose pod replaced due to having it remove for MRI.

## 2021-12-02 NOTE — Progress Notes (Signed)
*  PRELIMINARY RESULTS* Echocardiogram 2D Echocardiogram has been performed.  [name] [name] 12/02/2021, 11:46 AM

## 2021-12-02 NOTE — Evaluation (Signed)
Physical Therapy Evaluation Patient Details Name: [name] [name] MRN: [id number] DOB: 1940-07-12 Today's Date: 12/02/2021  History of Present Illness  [name] [name] is a 81 y.o. female with medical history significant of chronic diastolic CHF, W7PX, stage IV CKD, hypertension, hyperlipidemia who presents to the emergency department due to reported syncopal episode at home this evening.  Patient complained of left-sided upper back pain that has been ongoing for about a week, this worsened within last 3 days.  Daughter checked on patient today's evening and noted that patient was having slurred speech around 6:30 PM, she also presented with bilateral arm tremors, blood glucose was checked and it was normal, this was subsequently followed by patient becoming unconscious for about 2 minutes with immediate recovery and no postictal confusion.  Of note, patient was noted to be eating with hands (instead of cutlery) and this was unusual in patient.  There was no report of headache, fever, chest pain, shortness of breath, nausea, vomiting or abdominal pain.   Clinical Impression  Patient functioning near baseline for mobility with some generalized weakness and balance deficits noted. Patient requires assist to pull to seated EOB due to strength impairment. She demonstrates good sitting tolerance and sitting balance at EOB. Patient requires some assist to transfer to standing with labored movements without AD. She ambulates without AD with poor foot clearance bilaterally without loss of balance and is limited by fatigue. Patient educated on use of RW upon return home to reduce risk of falls and improve balance. Patient will benefit from continued skilled physical therapy in hospital and recommended venue below to increase strength, balance, endurance for safe ADLs and gait.        Recommendations for follow up therapy are one component of a multi-disciplinary discharge planning process, led by the  attending physician.  Recommendations may be updated based on patient status, additional functional criteria and insurance authorization.  Follow Up Recommendations Home health PT      Assistance Recommended at Discharge Intermittent Supervision/Assistance  Patient can return home with the following       Equipment Recommendations None recommended by PT  Recommendations for Other Services       Functional Status Assessment Patient has had a recent decline in their functional status and demonstrates the ability to make significant improvements in function in a reasonable and predictable amount of time.     Precautions / Restrictions Precautions Precautions: Fall Restrictions Weight Bearing Restrictions: No      Mobility  Bed Mobility Overal bed mobility: Needs Assistance Bed Mobility: Supine to Sit     Supine to sit: Min assist, HOB elevated     General bed mobility comments: assist to pull to seated EOB    Transfers Overall transfer level: Needs assistance Equipment used: None Transfers: Sit to/from Stand Sit to Stand: Min guard, Min assist           General transfer comment: labored transfer to standing without AD    Ambulation/Gait Ambulation/Gait assistance: Min guard Gait Distance (Feet): 40 Feet Assistive device: None Gait Pattern/deviations: Shuffle Gait velocity: decreased     General Gait Details: poor foot clearance bilaterally  Stairs            Wheelchair Mobility    Modified Rankin (Stroke Patients Only)       Balance Overall balance assessment: Needs assistance Sitting-balance support: No upper extremity supported Sitting balance-Leahy Scale: Good Sitting balance - Comments: seated EOB   Standing balance support: No upper extremity supported  Standing balance-Leahy Scale: Fair Standing balance comment: good/fair                             Pertinent Vitals/Pain Pain Assessment Pain Assessment: 0-10 Pain  Score: 7  Pain Location: mid back Pain Descriptors / Indicators: Sore Pain Intervention(s): Limited activity within patient's tolerance, Monitored during session, Repositioned    Home Living Family/patient expects to be discharged to:: Private residence Living Arrangements: Spouse/significant other Available Help at Discharge: Family Type of Home: House Home Access: Stairs to enter Entrance Stairs-Rails: Right;Left;Can reach both Entrance Stairs-Number of Steps: 4 Alternate Level Stairs-Number of Steps:  (lift chair on stairs) Home Layout: Two level Home Equipment: Conservation officer, nature (2 wheels);Cane - single point;Grab bars - tub/shower;Grab bars - toilet;Shower seat - built in      Prior Function Prior Level of Function : Independent/Modified Independent             Mobility Comments: patient states community ambulation without AD ADLs Comments: indepdendent     Hand Dominance        Extremity/Trunk Assessment   Upper Extremity Assessment Upper Extremity Assessment: Defer to OT evaluation    Lower Extremity Assessment Lower Extremity Assessment: Overall WFL for tasks assessed;Generalized weakness       Communication   Communication: No difficulties  Cognition Arousal/Alertness: Awake/alert Behavior During Therapy: WFL for tasks assessed/performed Overall Cognitive Status: Within Functional Limits for tasks assessed                                 General Comments: 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        General Comments      Exercises     Assessment/Plan    PT Assessment Patient needs continued PT services  PT Problem List Decreased strength;Decreased mobility;Decreased activity tolerance;Decreased balance;Pain       PT Treatment Interventions DME instruction;Therapeutic exercise;Gait training;Balance training;Neuromuscular re-education;Stair training;Functional mobility training;Therapeutic activities;Patient/family education    PT  Goals (Current goals can be found in the Care Plan section)  Acute Rehab PT Goals Patient Stated Goal: Return home PT Goal Formulation: With patient Time For Goal Achievement: 12/09/21 Potential to Achieve Goals: Good    Frequency Min 3X/week     Co-evaluation PT/OT/SLP Co-Evaluation/Treatment: Yes Reason for Co-Treatment: To address functional/ADL transfers PT goals addressed during session: Mobility/safety with mobility;Balance;Proper use of DME;Strengthening/ROM         AM-PAC PT "6 Clicks" Mobility  Outcome Measure Help needed turning from your back to your side while in a flat bed without using bedrails?: None Help needed moving from lying on your back to sitting on the side of a flat bed without using bedrails?: A Little Help needed moving to and from a bed to a chair (including a wheelchair)?: A Little Help needed standing up from a chair using your arms (e.g., wheelchair or bedside chair)?: A Little Help needed to walk in hospital room?: A Little Help needed climbing 3-5 steps with a railing? : A Little 6 Click Score: 19    End of Session Equipment Utilized During Treatment: Gait belt Activity Tolerance: Patient limited by fatigue;Patient tolerated treatment well Patient left: in bed;with call bell/phone within reach Nurse Communication: Mobility status PT Visit Diagnosis: Unsteadiness on feet (R26.81);Other abnormalities of gait and mobility (R26.89);Muscle weakness (generalized) (M62.81)    Time: 6967-8938 PT Time Calculation (min) (ACUTE  ONLY): 21 min   Charges:   PT Evaluation $PT Eval Low Complexity: 1 Low          9:40 AM, 12/02/21 Mearl Latin PT, DPT Physical Therapist at [facility]

## 2021-12-02 NOTE — Care Management Obs Status (Signed)
[facility] NOTIFICATION   Patient Details  Name: [name] [name] MRN: [id number] Date of Birth: 06-12-1940   Medicare Observation Status Notification Given:  Yes    Ihor Gully, LCSW 12/02/2021, 1:24 PM

## 2021-12-02 NOTE — Evaluation (Signed)
Occupational Therapy Evaluation Patient Details Name: [name] [name] MRN: [id number] DOB: Jul 02, 1940 Today's Date: 12/02/2021   History of Present Illness [name] [name] is a 81 y.o. female with medical history significant of chronic diastolic CHF, V4BS, stage IV CKD, hypertension, hyperlipidemia who presents to the emergency department due to reported syncopal episode at home this evening.  Patient complained of left-sided upper back pain that has been ongoing for about a week, this worsened within last 3 days.  Daughter checked on patient today's evening and noted that patient was having slurred speech around 6:30 PM, she also presented with bilateral arm tremors, blood glucose was checked and it was normal, this was subsequently followed by patient becoming unconscious for about 2 minutes with immediate recovery and no postictal confusion.  Of note, patient was noted to be eating with hands (instead of cutlery) and this was unusual in patient.  There was no report of headache, fever, chest pain, shortness of breath, nausea, vomiting or abdominal pain.   Clinical Impression   Pt agreeable to OT and PT co-evaluation. Pt presents with mild L UE weakness compared to R but pt did not seem concerned about this and was not reporting any significant changes from baseline. Pt is generally weak with mildly labored ambulation in the room and hall. Pt has 24/7 assist at home from spouse and appears to be near baseline levels for functional ADL's such as dressing and grooming. Pt is not recommended for further acute OT services and will be discharged to care of nursing staff for remaining length of stay.       Recommendations for follow up therapy are one component of a multi-disciplinary discharge planning process, led by the attending physician.  Recommendations may be updated based on patient status, additional functional criteria and insurance authorization.   Follow Up Recommendations  No OT follow  up    Assistance Recommended at Discharge Set up Supervision/Assistance  Patient can return home with the following A little help with walking and/or transfers;Help with stairs or ramp for entrance    Functional Status Assessment  Patient has had a recent decline in their functional status and demonstrates the ability to make significant improvements in function in a reasonable and predictable amount of time.  Equipment Recommendations  None recommended by OT    Recommendations for Other Services       Precautions / Restrictions Precautions Precautions: Fall Restrictions Weight Bearing Restrictions: No      Mobility Bed Mobility Overal bed mobility: Needs Assistance Bed Mobility: Supine to Sit     Supine to sit: Min assist, HOB elevated     General bed mobility comments: assist to pull to seated EOB    Transfers Overall transfer level: Needs assistance Equipment used: None Transfers: Sit to/from Stand Sit to Stand: Min guard, Min assist           General transfer comment: as per PT      Balance                                           ADL either performed or assessed with clinical judgement   ADL Overall ADL's : Needs assistance/impaired     Grooming: Standing;Min guard       Lower Body Bathing: Supervison/ safety;Sitting/lateral leans;Modified independent       Lower Body Dressing: Modified independent;Supervision/safety;Sitting/lateral leans Lower Body Dressing  Details (indicate cue type and reason): Able to doff and don sock seated at EOB without assist. Toilet Transfer: Ambulation;Min guard Toilet Transfer Details (indicate cue type and reason): Simulated via ambulation in hall from bed and return to bed.         Functional mobility during ADLs: Min guard General ADL Comments: Mild labored movement in standing and ambulatino without AD.     Vision Baseline Vision/History: 1 Wears glasses;2 Legally blind (Blind in L eye  per pt report.) Ability to See in Adequate Light: 2 Moderately impaired Patient Visual Report: No change from baseline Vision Assessment?: No apparent visual deficits Additional Comments: other than baseline issues.     Perception     Praxis      Pertinent Vitals/Pain Pain Assessment Pain Assessment: 0-10 Pain Score: 7  Pain Location: mid back Pain Descriptors / Indicators: Sore Pain Intervention(s): Limited activity within patient's tolerance, Monitored during session, Repositioned     Hand Dominance     Extremity/Trunk Assessment Upper Extremity Assessment Upper Extremity Assessment: Generalized weakness;LUE deficits/detail LUE Deficits / Details: 4-/5 grossly in L UE but pt did not appear concerned about mild weakness compared to R side. This may be residual from previous stroke due to pt's lack of noticing a difference in strength.   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      Home Living Family/patient expects to be discharged to:: Private residence Living Arrangements: Spouse/significant other Available Help at Discharge: Family Type of Home: House Home Access: Stairs to enter Technical brewer of Steps: 4 Entrance Stairs-Rails: Right;Left;Can reach both Home Layout: Two level Alternate Level Stairs-Number of Steps:  (lift chair on stairs)   Bathroom Shower/Tub: Occupational psychologist: Handicapped height Bathroom Accessibility: Yes How Accessible: Accessible via walker Home Equipment: Conservation officer, nature (2  wheels);Cane - single point;Grab bars - tub/shower;Grab bars - toilet;Shower seat - built in          Prior Functioning/Environment Prior Level of Function : Independent/Modified Independent             Mobility Comments: patient states community ambulation without AD ADLs Comments: indepdendent                      OT Goals(Current goals can be found in the care plan section) Acute Rehab OT Goals Patient Stated Goal: return home  OT Frequency:      Co-evaluation PT/OT/SLP Co-Evaluation/Treatment: Yes Reason for Co-Treatment: To address functional/ADL transfers PT goals addressed during session: Mobility/safety with mobility;Balance;Proper use of DME;Strengthening/ROM OT goals addressed during session: ADL's and self-care      AM-PAC OT "6 Clicks" Daily Activity     Outcome Measure Help from another person eating meals?: None Help from another person taking care of personal grooming?: A Little Help from another person toileting, which includes using toliet, bedpan, or urinal?: None Help from another person bathing (including washing, rinsing, drying)?: None Help from another person to put on and taking off regular upper  body clothing?: None Help from another person to put on and taking off regular lower body clothing?: None 6 Click Score: 23   End of Session    Activity Tolerance: Patient tolerated treatment well Patient left: in bed;with call bell/phone within reach  OT Visit Diagnosis: Unsteadiness on feet (R26.81);Muscle weakness (generalized) (M62.81);Other symptoms and signs involving the nervous system (U57.505)                Time: 1833-5825 OT Time Calculation (min): 21 min Charges:  OT General Charges $OT Visit: 1 Visit OT Evaluation $OT Eval Low Complexity: 1 Low  Letricia Krinsky OT, MOT  Larey Seat 12/02/2021, 9:45 AM

## 2021-12-02 NOTE — Progress Notes (Signed)
PT recommends HHPT. Patient is agreeable to HHPT. Referral made to and accepted by [facility] with Alvis Lemmings. [facility] start of care next week. Patient requests that daughter, [name] [name], be called at [phone], to schedule visits.    Amere Iott, Clydene Pugh, LCSW

## 2021-12-03 DIAGNOSIS — Z5189 Encounter for other specified aftercare: Secondary | ICD-10-CM | POA: Diagnosis not present

## 2021-12-03 DIAGNOSIS — N189 Chronic kidney disease, unspecified: Secondary | ICD-10-CM | POA: Diagnosis not present

## 2021-12-03 DIAGNOSIS — R55 Syncope and collapse: Secondary | ICD-10-CM | POA: Diagnosis not present

## 2021-12-03 DIAGNOSIS — I10 Essential (primary) hypertension: Secondary | ICD-10-CM | POA: Diagnosis not present

## 2021-12-03 DIAGNOSIS — N179 Acute kidney failure, unspecified: Secondary | ICD-10-CM | POA: Diagnosis not present

## 2021-12-03 LAB — CBC WITH DIFFERENTIAL/PLATELET
Abs Immature Granulocytes: 0.05 10*3/uL (ref 0.00–0.07)
Basophils Absolute: 0.1 10*3/uL (ref 0.0–0.1)
Basophils Relative: 1 %
Eosinophils Absolute: 0.4 10*3/uL (ref 0.0–0.5)
Eosinophils Relative: 4 %
HCT: 28.8 % — ABNORMAL LOW (ref 36.0–46.0)
Hemoglobin: 9.6 g/dL — ABNORMAL LOW (ref 12.0–15.0)
Immature Granulocytes: 1 %
Lymphocytes Relative: 17 %
Lymphs Abs: 1.7 10*3/uL (ref 0.7–4.0)
MCH: 30 pg (ref 26.0–34.0)
MCHC: 33.3 g/dL (ref 30.0–36.0)
MCV: 90 fL (ref 80.0–100.0)
Monocytes Absolute: 1 10*3/uL (ref 0.1–1.0)
Monocytes Relative: 10 %
Neutro Abs: 6.7 10*3/uL (ref 1.7–7.7)
Neutrophils Relative %: 67 %
Platelets: 342 10*3/uL (ref 150–400)
RBC: 3.2 MIL/uL — ABNORMAL LOW (ref 3.87–5.11)
RDW: 13.2 % (ref 11.5–15.5)
WBC: 10 10*3/uL (ref 4.0–10.5)
nRBC: 0 % (ref 0.0–0.2)

## 2021-12-03 LAB — BASIC METABOLIC PANEL
Anion gap: 7 (ref 5–15)
BUN: 75 mg/dL — ABNORMAL HIGH (ref 8–23)
CO2: 24 mmol/L (ref 22–32)
Calcium: 8.5 mg/dL — ABNORMAL LOW (ref 8.9–10.3)
Chloride: 109 mmol/L (ref 98–111)
Creatinine, Ser: 3.9 mg/dL — ABNORMAL HIGH (ref 0.44–1.00)
GFR, Estimated: 11 mL/min — ABNORMAL LOW (ref >60)
Glucose, Bld: 276 mg/dL — ABNORMAL HIGH (ref 70–99)
Potassium: 3.6 mmol/L (ref 3.5–5.1)
Sodium: 140 mmol/L (ref 135–145)

## 2021-12-03 LAB — GLUCOSE, CAPILLARY: Glucose-Capillary: 269 mg/dL — ABNORMAL HIGH (ref 70–99)

## 2021-12-03 MED ORDER — HYDRALAZINE HCL 25 MG PO TABS
50.0000 mg | ORAL_TABLET | Freq: Two times a day (BID) | ORAL | Status: DC
  Administered 2021-12-03: 50 mg via ORAL
  Filled 2021-12-03: qty 2

## 2021-12-03 MED ORDER — HYDROXYZINE HCL 25 MG PO TABS
25.0000 mg | ORAL_TABLET | Freq: Three times a day (TID) | ORAL | Status: DC | PRN
  Administered 2021-12-03: 25 mg via ORAL
  Filled 2021-12-03: qty 1

## 2021-12-03 MED ORDER — AZELASTINE HCL 0.1 % NA SOLN
1.0000 | Freq: Two times a day (BID) | NASAL | Status: DC
Start: 2021-12-03 — End: 2021-12-03
  Administered 2021-12-03: 1 via NASAL
  Filled 2021-12-03: qty 30

## 2021-12-03 NOTE — Progress Notes (Signed)
Discussed discharge instructions with patient and daughter, [name], as well as medications. Patient and daughter both understood discharge instructions. No questions at this time.

## 2021-12-03 NOTE — Progress Notes (Signed)
Patient systolic blood pressure has been consistently elevated. Patient is asymptomatic. Nurse will administer scheduled morning anti-hypertensives and reassess.

## 2021-12-03 NOTE — TOC Transition Note (Signed)
Transition of Care [facility]) - CM/SW Discharge Note   Patient Details  Name: DURINDA BUZZELLI MRN: [id number] Date of Birth: 03-19-1941  [facility] CM/SW Contact:  Shade Flood, LCSW Phone Number: 12/03/2021, 2:29 PM   Clinical Narrative:     Pt stable for dc home today per MD. Updated Tommi Rumps at [place]. There are no other TOC needs for dc.  Final next level of care: Home w Home Health Services Barriers to Discharge: Barriers Resolved   Patient Goals and CMS Choice   CMS Medicare.gov Compare Post Acute Care list provided to:: Patient Choice offered to / list presented to : Patient  Discharge Placement                       Discharge Plan and Services                          HH Arranged: PT, RN, Social Work [facility] Agency: [place] Date [facility] Agency Contacted: 12/02/21   Representative spoke with at [place]: [place] ([place]) Interventions     Readmission Risk Interventions     No data to display

## 2021-12-05 ENCOUNTER — Telehealth: Payer: Self-pay

## 2021-12-05 LAB — GLUCOSE, CAPILLARY: Glucose-Capillary: 298 mg/dL — ABNORMAL HIGH (ref 70–99)

## 2021-12-05 NOTE — Discharge Summary (Signed)
Physician Discharge Summary   Patient: [name] [name] MRN: [id number] DOB: 09/19/1940  Admit date:     12/01/2021  Discharge date: 12/03/2021  Discharge Physician: Hosie Poisson   PCP: Glendale Chard, MD   Recommendations at discharge:  Please follow up WITH PCP in one week.  Please follow up with Neurology as recommended in one week.   Discharge Diagnoses: Principal Problem:   Syncope and collapse Active Problems:   Type 2 diabetes mellitus (HCC)   Essential hypertension   Mixed hyperlipidemia   Hypermagnesemia   Hypokalemia   Acute kidney injury superimposed on chronic kidney disease (HCC)   Prolonged QT interval   Elevated troponin    Hospital Course: [name] [name] is a 81 y.o. female with medical history significant of chronic diastolic CHF, C7EL, stage IV CKD, hypertension, hyperlipidemia who presents to the emergency department due to reported syncopal episode at home this evening.  Patient complained of left-sided upper back pain that has been ongoing for about a week, this worsened within last 3 days.  Daughter checked on patient today's evening and noted that patient was having slurred speech around 6:30 PM, she also presented with bilateral arm tremors, blood glucose was checked and it was normal, this was subsequently followed by patient becoming unconscious for about 2 minutes with immediate recovery and no postictal confusion.  CT head without contrast showed no acute intracranial hemorrhage or infarct Thoracic spine x-ray showed moderate degenerative disc disease.  No acute fracture or listhesis Chest x-ray showed pulmonary hyperinflation Hospitalist was asked to admit patient for further evaluation and management. MRI brain with out contrast showed No acute intracranial abnormality. Chronic posterior right cerebral infarct. Mild chronic small vessel ischemic disease. US carotid duplex is negative for significant stenosis.   Assessment and Plan: AKI superimposed  on stage IV CKD Creatinine at baseline appears to be between 2.5-3.5. She was admitted with creatinine of 4.5 has improved to 3.9 on discharge.  Monitor strict intake and output. Discussed with the daughter, and recommended outpatient follow up with Neurology.  Therapy eval recommending home health.         Hypokalemia Replaced Magnesium level is 2.8.         Anemia of chronic disease Probably secondary to stage IV CKD.       Syncope Unclear etiology suspect from dehydration and orthostatic hypotension Patient currently denies any dizziness during therapy. MRI of the brain without contrast is negative for acute stroke Carotid duplex is unremarkable.  EEG ordered and shows This study is within normal limits. No seizures or epileptiform discharges were seen throughout the recording. Recommended follow up with neurology as needed.        Consultants: nephrology.  Procedures performed: MRI EEG  Disposition: Home Diet recommendation:  Discharge Diet Orders (From admission, onward)     Start     Ordered   12/03/21 0000  Diet - low sodium heart healthy        12/03/21 1408           Cardiac diet DISCHARGE MEDICATION: Allergies as of 12/03/2021       Reactions   Brimonidine Other (See Comments)   Eyes swell and itch   Dorzolamide    Other reaction(s): Other (See Comments) Red, inflamed eyelid, dermatitis    Timolol Other (See Comments)   Caused eyes to swell and itch        Medication List     STOP taking these medications    cetirizine 10  MG tablet Commonly known as: ZyrTEC Allergy   fluticasone 50 MCG/ACT nasal spray Commonly known as: Flonase   furosemide 20 MG tablet Commonly known as: LASIX       TAKE these medications    Accu-Chek Aviva Plus test strip Generic drug: glucose blood TEST THREE TIMES DAILY AS DIRECTED   True Metrix Blood Glucose Test test strip Generic drug: glucose blood Use as instructed to check blood sugar 3 times  a day. Dx code e11.65   accu-chek soft touch lancets Check blood sugars 3 times daily E11.65   amLODipine 5 MG tablet Commonly known as: NORVASC Take 1 tablet (5 mg total) by mouth daily.   aspirin EC 81 MG tablet Take 1 tablet (81 mg total) by mouth daily. What changed: how much to take   atorvastatin 20 MG tablet Commonly known as: Lipitor Take 1 tablet (20 mg total) by mouth daily.   azelastine 0.1 % nasal spray Commonly known as: ASTELIN Place 1 spray into both nostrils 2 (two) times daily. Use in each nostril as directed   B-D UF III MINI PEN NEEDLES 31G X 5 MM Misc Generic drug: Insulin Pen Needle Use as directed 4x a day   blood glucose meter kit and supplies Dispense based on patient and insurance preference. Use up to four times daily as directed.dx code e11.65   Dexcom G6 Receiver Devi Use to check blood sugar 3 times a day before meals. Dx code e11.65   Dexcom G6 Sensor Misc Use to check blood sugar 3 times a day before meals. Dx code e11.65   Gvoke HypoPen 2-Pack 0.5 MG/0.1ML Soaj Generic drug: Glucagon Inject 0.5 mg into the skin daily as needed.   HumaLOG KwikPen 200 UNIT/ML KwikPen Generic drug: insulin lispro inject 8-10 units into THE SKIN 15 minutes BEFORE THREE main meals of THE DAY What changed: when to take this   hydrALAZINE 50 MG tablet Commonly known as: APRESOLINE Take 1 tablet (50 mg total) by mouth 2 (two) times daily.   hydrOXYzine 25 MG tablet Commonly known as: ATARAX Take 1 tablet (25 mg total) by mouth 3 (three) times daily as needed for itching.   IRON PO Take 65 mg by mouth daily.   latanoprost 0.005 % ophthalmic solution Commonly known as: XALATAN Place 1 drop into both eyes at bedtime.   metoprolol succinate 25 MG 24 hr tablet Commonly known as: TOPROL-XL Take 0.5 tablets (12.5 mg total) by mouth daily.   multivitamin tablet Take 1 tablet by mouth daily.   Ozempic (1 MG/DOSE) 2 MG/1.5ML Sopn Generic drug:  Semaglutide (1 MG/DOSE) Inject 1 mg into the skin once a week.   [name] FlexTouch 200 UNIT/ML FlexTouch Pen Generic drug: insulin degludec Inject 20 Units into the skin daily.   True Metrix Meter Devi Use to check blood sugars 3 times a day.   Vitamin D3 50 MCG (2000 UT) Tabs Take 1 tablet by mouth daily at 12 noon.        Follow-up Information     [facility], [facility] Follow up.   Specialty: [place] Why: Alvis Lemmings staff will call you to schedule in home visits Contact information: [address] [place] [address] [phone]                Discharge Exam: There were no vitals filed for this visit. General exam: Appears calm and comfortable  Respiratory system: Clear to auscultation. Respiratory effort normal. Cardiovascular system: S1 & S2  heard, RRR. No JVD, murmurs, rubs, gallops or clicks. No pedal edema. Gastrointestinal system: Abdomen is nondistended, soft and nontender. No organomegaly or masses felt. Normal bowel sounds heard. Central nervous system: Alert and oriented. No focal neurological deficits. Extremities: Symmetric 5 x 5 power. Skin: No rashes, lesions or ulcers Psychiatry: Judgement and insight appear normal. Mood & affect appropriate.    Condition at discharge: fair  The results of significant diagnostics from this hospitalization (including imaging, microbiology, ancillary and laboratory) are listed below for reference.   Imaging Studies: EEG adult  Result Date: 2021-12-30 Lora Havens, MD     2021-12-30  5:00 PM Patient Name: AMRUTHA AVERA MRN: [id number] Epilepsy Attending: Lora Havens Referring Physician/Provider: Hosie Poisson, MD Date: 12/30/2021 Duration: 25.21 mins Patient history: 81yo F with syncope. EEG to evaluate for seizure. Level of alertness: Awake, asleep AEDs during EEG study: None Technical aspects: This EEG study was done with scalp electrodes positioned according to the 10-20  International system of electrode placement. Electrical activity was reviewed with band pass filter of 1-70Hz , sensitivity of 7 uV/mm, display speed of 29m/sec with a 60Hz  notched filter applied as appropriate. EEG data were recorded continuously and digitally stored.  Video monitoring was available and reviewed as appropriate. Description: The posterior dominant rhythm consists of 8 Hz activity of moderate voltage (25-35 uV) seen predominantly in posterior head regions, symmetric and reactive to eye opening and eye closing. Sleep was characterized by vertex waves, sleep spindles (12 to 14 Hz), maximal frontocentral region. Hyperventilation and photic stimulation were not performed.   IMPRESSION: This study is within normal limits. No seizures or epileptiform discharges were seen throughout the recording. A normal interictal EEG does not exclude nor support the diagnosis of epilepsy. PLora Havens  ECHOCARDIOGRAM COMPLETE  Result Date: 822-Sep-2023   ECHOCARDIOGRAM REPORT   Patient Name:   JMERRIAM BRANDNERDate of Exam: 8Sep 22, 2023Medical Rec #:  [id number]        Height:       62.0 in Accession #:    [id number]       Weight:       162.0 lb Date of Birth:  305-Nov-1942        BSA:          1.748 m Patient Age:    89years          BP:           169/58 mmHg Patient Gender: F                 HR:           71 bpm. Exam Location:  [facility]Procedure: 2D Echo, Cardiac Doppler and Color Doppler Indications:    Syncope  History:        Patient has prior history of Echocardiogram examinations, most                 recent 04/05/2021. CHF, Signs/Symptoms:Syncope; Risk                 Factors:Hypertension, Diabetes and Dyslipidemia.  Sonographer:    DWenda LowReferring Phys: [id number] ADEFESO  Sonographer Comments: Image acquisition challenging due to respiratory motion. IMPRESSIONS  1. Left ventricular ejection fraction, by estimation, is 60 to 65%. The left ventricle has normal function. The left  ventricle has no regional wall motion abnormalities. There is mild left ventricular hypertrophy. Left ventricular diastolic parameters are indeterminate.  2. Right ventricular systolic function is normal. The right ventricular size is normal. Tricuspid regurgitation signal is inadequate for assessing PA pressure.  3. Left atrial size was moderately dilated.  4. The mitral valve is abnormal. No evidence of mitral valve regurgitation. Mild to moderate mitral stenosis. The mean mitral valve gradient is 5.5 mmHg. Moderate to severe mitral annular calcification.  5. The aortic valve was not well visualized. Aortic valve regurgitation is not visualized.  6. The inferior vena cava is normal in size with greater than 50% respiratory variability, suggesting right atrial pressure of 3 mmHg. Comparison(s): No significant change from prior study. FINDINGS  Left Ventricle: Left ventricular ejection fraction, by estimation, is 60 to 65%. The left ventricle has normal function. The left ventricle has no regional wall motion abnormalities. The left ventricular internal cavity size was normal in size. There is  mild left ventricular hypertrophy. Left ventricular diastolic parameters are indeterminate. Right Ventricle: The right ventricular size is normal. Right ventricular systolic function is normal. Tricuspid regurgitation signal is inadequate for assessing PA pressure. Left Atrium: Left atrial size was moderately dilated. Right Atrium: Right atrial size was normal in size. Pericardium: Trivial pericardial effusion is present. Mitral Valve: The mitral valve is abnormal. Moderate to severe mitral annular calcification. No evidence of mitral valve regurgitation. Mild to moderate mitral valve stenosis. MV peak gradient, 12.7 mmHg. The mean mitral valve gradient is 5.5 mmHg. Tricuspid Valve: The tricuspid valve is normal in structure. Tricuspid valve regurgitation is not demonstrated. Aortic Valve: The aortic valve was not well  visualized. Aortic valve regurgitation is not visualized. Aortic valve mean gradient measures 6.0 mmHg. Aortic valve peak gradient measures 12.1 mmHg. Aortic valve area, by VTI measures 2.64 cm. Pulmonic Valve: Pulmonic valve regurgitation is trivial. Aorta: The aortic root and ascending aorta are structurally normal, with no evidence of dilitation. Venous: The inferior vena cava is normal in size with greater than 50% respiratory variability, suggesting right atrial pressure of 3 mmHg.  LEFT VENTRICLE PLAX 2D LVIDd:         5.20 cm     Diastology LVIDs:         2.50 cm     LV e' medial:    3.09 cm/s LV PW:         1.20 cm     LV E/e' medial:  50.5 LV IVS:        1.40 cm     LV e' lateral:   7.03 cm/s LVOT diam:     2.00 cm     LV E/e' lateral: 22.2 LV SV:         105 LV SV Index:   60 LVOT Area:     3.14 cm  LV Volumes (MOD) LV vol d, MOD A2C: 57.5 ml LV vol d, MOD A4C: 56.1 ml LV vol s, MOD A2C: 16.3 ml LV vol s, MOD A4C: 17.6 ml LV SV MOD A2C:     41.2 ml LV SV MOD A4C:     56.1 ml LV SV MOD BP:      40.1 ml RIGHT VENTRICLE RV Basal diam:  3.25 cm RV Mid diam:    2.50 cm RV S prime:     14.30 cm/s TAPSE (M-mode): 2.3 cm LEFT ATRIUM             Index        RIGHT ATRIUM           Index LA diam:  4.60 cm 2.63 cm/m   RA Area:     15.60 cm LA Vol (A2C):   79.9 ml 45.71 ml/m  RA Volume:   38.80 ml  22.20 ml/m LA Vol (A4C):   70.5 ml 40.33 ml/m LA Biplane Vol: 76.5 ml 43.77 ml/m  AORTIC VALVE                     PULMONIC VALVE AV Area (Vmax):    2.37 cm      PV Vmax:       0.95 m/s AV Area (Vmean):   2.40 cm      PV Peak grad:  3.6 mmHg AV Area (VTI):     2.64 cm AV Vmax:           174.00 cm/s AV Vmean:          112.000 cm/s AV VTI:            0.398 m AV Peak Grad:      12.1 mmHg AV Mean Grad:      6.0 mmHg LVOT Vmax:         131.00 cm/s LVOT Vmean:        85.400 cm/s LVOT VTI:          0.334 m LVOT/AV VTI ratio: 0.84  AORTA Ao Root diam: 3.20 cm MITRAL VALVE MV Area (PHT): 3.07 cm     SHUNTS MV Area  VTI:   2.02 cm     Systemic VTI:  0.33 m MV Peak grad:  12.7 mmHg    Systemic Diam: 2.00 cm MV Mean grad:  5.5 mmHg MV Vmax:       1.78 m/s MV Vmean:      108.5 cm/s MV Decel Time: 247 msec MV E velocity: 156.00 cm/s MV A velocity: 151.00 cm/s MV E/A ratio:  1.03 Landscape architect signed by Phineas Inches Signature Date/Time: 12/02/2021/1:40:42 PM    Final    US Carotid Bilateral  Result Date: 12/02/2021 CLINICAL DATA:  Syncope and collapse EXAM: BILATERAL CAROTID DUPLEX ULTRASOUND TECHNIQUE: Pearline Cables scale imaging, color Doppler and duplex ultrasound were performed of bilateral carotid and vertebral arteries in the neck. COMPARISON:  None available FINDINGS: Criteria: Quantification of carotid stenosis is based on velocity parameters that correlate the residual internal carotid diameter with NASCET-based stenosis levels, using the diameter of the distal internal carotid lumen as the denominator for stenosis measurement. The following velocity measurements were obtained: RIGHT ICA: 75/20 cm/sec CCA: 88/3 cm/sec SYSTOLIC ICA/CCA RATIO:  1.2 ECA: 90 cm/sec LEFT ICA: 67/18 cm/sec CCA: 25/4 cm/sec SYSTOLIC ICA/CCA RATIO:  0.8 ECA: 82 cm/sec RIGHT CAROTID ARTERY: Mild calcified plaque of the carotid bifurcation. RIGHT VERTEBRAL ARTERY:  Antegrade flow. LEFT CAROTID ARTERY:  No significant atheromatous plaque. LEFT VERTEBRAL ARTERY:  Antegrade flow. IMPRESSION: No significant stenosis of internal carotid arteries. Electronically Signed   By: Miachel Roux M.D.   On: 12/02/2021 11:49   MR BRAIN WO CONTRAST  Result Date: 12/02/2021 CLINICAL DATA:  Neuro deficit, acute, stroke suspected.  Syncope. EXAM: MRI HEAD WITHOUT CONTRAST TECHNIQUE: Multiplanar, multiecho pulse sequences of the brain and surrounding structures were obtained without intravenous contrast. COMPARISON:  Head CT 12/01/2021 and MRI 02/12/2019 FINDINGS: Brain: There is no evidence of an acute infarct, intracranial hemorrhage, mass, midline shift, or  extra-axial fluid collection. A moderately large region of encephalomalacia is again noted involving the right parietal, temporal, and occipital lobes with associated ex vacuo dilatation of the right lateral  ventricle. Small T2 hyperintensities elsewhere in the cerebral white matter bilaterally are stable to minimally increased compared to the prior MRI and are nonspecific but compatible with mild chronic small vessel ischemic disease. A chronic lacunar infarct in the left lentiform nucleus is new from the prior MRI. There is mild global cerebral atrophy. Vascular: Major intracranial vascular flow voids are preserved. Skull and upper cervical spine: Diffusely diminished bone marrow T1 signal intensity likely related to known anemia. Sinuses/Orbits: Unremarkable orbits. Paranasal sinuses and mastoid air cells are clear. Other: None. IMPRESSION: 1. No acute intracranial abnormality. 2. Chronic posterior right cerebral infarct. 3. Mild chronic small vessel ischemic disease. Electronically Signed   By: Logan Bores M.D.   On: 12/02/2021 10:23   CT Head Wo Contrast  Result Date: 12/01/2021 CLINICAL DATA:  Neuro deficit, acute, stroke suspected.  Syncope EXAM: CT HEAD WITHOUT CONTRAST TECHNIQUE: Contiguous axial images were obtained from the base of the skull through the vertex without intravenous contrast. RADIATION DOSE REDUCTION: This exam was performed according to the departmental dose-optimization program which includes automated exposure control, adjustment of the mA and/or kV according to patient size and/or use of iterative reconstruction technique. COMPARISON:  11/23/2021 FINDINGS: Brain: Normal anatomic configuration. Parenchymal volume loss is commensurate with the patient's age. Mild periventricular white matter changes are present likely reflecting the sequela of small vessel ischemia. Cystic encephalomalacia within the right temporoparietal cortex related to remote cortical infarct is again noted.  Remote lacunar infarct within the left putamen again noted. No abnormal intra or extra-axial mass lesion or fluid collection. No abnormal mass effect or midline shift. No evidence of acute intracranial hemorrhage or infarct. Ventricular size is normal. Cerebellum unremarkable. Vascular: No asymmetric hyperdense vasculature at the skull base. Skull: Intact Sinuses/Orbits: Paranasal sinuses are clear. Orbits are unremarkable. Other: Mastoid air cells and middle ear cavities are clear. IMPRESSION: No acute intracranial hemorrhage or infarct. Stable senescent changes and remote infarcts. Electronically Signed   By: Fidela Salisbury M.D.   On: 12/01/2021 22:26   DG Thoracic Spine W/Swimmers  Result Date: 12/01/2021 CLINICAL DATA:  Mid back pain EXAM: THORACIC SPINE - 3 VIEWS COMPARISON:  None Available. FINDINGS: Normal thoracic kyphosis. No acute fracture or listhesis of the thoracic spine. Vertebral body height is preserved. Multilevel endplate changes and disc space narrowing is seen throughout the thoracic spine in keeping with changes of moderate degenerative disc disease, most severe within the midthoracic spine. IMPRESSION: Moderate degenerative disc disease. No acute fracture or listhesis. Electronically Signed   By: Fidela Salisbury M.D.   On: 12/01/2021 22:24   DG Chest 2 View  Result Date: 12/01/2021 CLINICAL DATA:  Syncope, mid back pain EXAM: CHEST - 2 VIEW COMPARISON:  11/24/2021 FINDINGS: Lung volumes are small and there is bibasilar atelectasis as well as vascular crowding at the hila. No superimposed confluent pulmonary infiltrate. Stable calcified granuloma within the retrocardiac region. No pneumothorax or pleural effusion. Cardiac size within normal limits. Pulmonary vascularity is normal. IMPRESSION: Pulmonary hypoinflation. Electronically Signed   By: Fidela Salisbury M.D.   On: 12/01/2021 22:23   DG Chest 2 View  Result Date: 11/24/2021 CLINICAL DATA:  Congestive heart failure EXAM: CHEST - 2  VIEW COMPARISON:  November 23, 2021 FINDINGS: The cardiomediastinal silhouette is unchanged in contour. No acute pleuroparenchymal abnormality. Unchanged left basilar linear opacities, likely atelectasis/scarring. No pleural effusion. No pneumothorax. Visualized abdomen is unremarkable. Multilevel degenerative changes of the thoracic spine. IMPRESSION: No active cardiopulmonary disease. Electronically Signed   By:  Beryle Flock M.D.   On: 11/24/2021 12:56   CT Head Wo Contrast  Result Date: 11/23/2021 CLINICAL DATA:  Intermittent dizziness for several days, initial encounter EXAM: CT HEAD WITHOUT CONTRAST TECHNIQUE: Contiguous axial images were obtained from the base of the skull through the vertex without intravenous contrast. RADIATION DOSE REDUCTION: This exam was performed according to the departmental dose-optimization program which includes automated exposure control, adjustment of the mA and/or kV according to patient size and/or use of iterative reconstruction technique. COMPARISON:  MRI from 02/12/2019 FINDINGS: Brain: No evidence of acute infarction, hemorrhage, hydrocephalus, extra-axial collection or mass lesion/mass effect. Chronic ischemic changes are noted with left basal ganglia lacunar infarct as well as a large infarct in the right temporoparietal region. This is stable from the prior MRI examination. Ex vacuo dilatation of the right lateral ventricle is noted. Mild atrophic changes are noted. Vascular: No hyperdense vessel or unexpected calcification. Skull: Normal. Negative for fracture or focal lesion. Sinuses/Orbits: No acute finding. Other: None. IMPRESSION: Chronic atrophic and ischemic changes similar to that seen on the prior exam. No acute intracranial abnormality noted. Electronically Signed   By: Inez Catalina M.D.   On: 11/23/2021 19:05   DG Chest 2 View  Result Date: 11/23/2021 CLINICAL DATA:  Shortness of breath EXAM: CHEST - 2 VIEW COMPARISON:  09/02/2021 FINDINGS: Mild  cardiomegaly. Both lungs are clear. Disc degenerative thoracic spine. IMPRESSION: Mild cardiomegaly without acute abnormality of the lungs. Electronically Signed   By: Delanna Ahmadi M.D.   On: 11/23/2021 18:15   MM 3D SCREEN BREAST BILATERAL  Result Date: 11/15/2021 CLINICAL DATA:  Screening. EXAM: DIGITAL SCREENING BILATERAL MAMMOGRAM WITH TOMOSYNTHESIS AND CAD TECHNIQUE: Bilateral screening digital craniocaudal and mediolateral oblique mammograms were obtained. Bilateral screening digital breast tomosynthesis was performed. The images were evaluated with computer-aided detection. COMPARISON:  Previous exam(s). ACR Breast Density Category b: There are scattered areas of fibroglandular density. FINDINGS: There are no findings suspicious for malignancy. IMPRESSION: No mammographic evidence of malignancy. A result letter of this screening mammogram will be mailed directly to the patient. RECOMMENDATION: Screening mammogram in one year. (Code:[id number]) BI-RADS CATEGORY  1: Negative. Electronically Signed   By: Dorise Bullion III M.D.   On: 11/15/2021 19:12    Microbiology: Results for orders placed or performed during the hospital encounter of 09/02/21  SARS Coronavirus 2 by RT PCR (hospital order, performed in [facility] hospital lab) *cepheid single result test* Anterior Nasal Swab     Status: None   Collection Time: 09/02/21  6:44 AM   Specimen: Anterior Nasal Swab  Result Value Ref Range Status   SARS Coronavirus 2 by RT PCR NEGATIVE NEGATIVE Final    Comment: (NOTE) SARS-CoV-2 target nucleic acids are NOT DETECTED.  The SARS-CoV-2 RNA is generally detectable in upper and lower respiratory specimens during the acute phase of infection. The lowest concentration of SARS-CoV-2 viral copies this assay can detect is 250 copies / mL. A negative result does not preclude SARS-CoV-2 infection and should not be used as the sole basis for treatment or other patient management decisions.  A negative  result may occur with improper specimen collection / handling, submission of specimen other than nasopharyngeal swab, presence of viral mutation(s) within the areas targeted by this assay, and inadequate number of viral copies (<250 copies / mL). A negative result must be combined with clinical observations, patient history, and epidemiological information.  Fact Sheet for Patients:   https://www.patel.info/  Fact Sheet for Healthcare Providers: https://hall.com/  This test is not yet approved or  cleared by the [place] and has been authorized for detection and/or diagnosis of SARS-CoV-2 by FDA under an Emergency Use Authorization (EUA).  This EUA will remain in effect (meaning this test can be used) for the duration of the COVID-19 declaration under Section 564(b)(1) of the Act, 21 U.S.C. section 360bbb-3(b)(1), unless the authorization is terminated or revoked sooner.  Performed at [facility] [facility], [address], [place], [place] [address]     Labs: CBC: Recent Labs  Lab 12/01/21 2125 12/02/21 0342 12/03/21 0559  WBC 10.3 11.6* 10.0  NEUTROABS  --   --  6.7  HGB 9.9* 8.7* 9.6*  HCT 29.4* 26.1* 28.8*  MCV 89.6 90.6 90.0  PLT 335 296 412   Basic Metabolic Panel: Recent Labs  Lab 12/01/21 2125 12/02/21 0342 12/02/21 1804 12/03/21 0559  NA 139 140 136 140  K 2.9* 3.0* 3.6 3.6  CL 105 110 106 109  CO2 24 23 21* 24  GLUCOSE 114* 134* 363* 276*  BUN 82* 78* 75* 75*  CREATININE 4.58* 4.26* 4.04* 3.90*  CALCIUM 8.3* 7.9* 8.2* 8.5*  MG 3.1* 2.8*  --   --   PHOS  --  5.7*  --   --    Liver Function Tests: Recent Labs  Lab 12/02/21 0342  AST 14*  ALT 11  ALKPHOS 65  BILITOT 0.5  PROT 5.7*  ALBUMIN 2.6*   CBG: Recent Labs  Lab 12/02/21 1141 12/02/21 1632 12/02/21 2137 12/03/21 0725 12/03/21 1111  GLUCAP 265* 370* 363* 269* 298*    Discharge time spent: 39 MINUTES.   Signed: Hosie Poisson, MD [facility] 12/05/2021

## 2021-12-05 NOTE — Telephone Encounter (Signed)
Transition Care Management Follow-up Telephone Call Date of discharge and from where: 12/03/2021 [facility]  How have you been since you were released from the hospital? Pt daughter states she is doing better. Any questions or concerns? No  Items Reviewed: Did the pt receive and understand the discharge instructions provided? Yes  Medications obtained and verified? Yes  Other? No  Any new allergies since your discharge? No  Dietary orders reviewed? Yes Do you have support at home? Yes   Home Care and Equipment/Supplies: Were home health services ordered? yes If so, what is the name of the agency? [facility]  Has the agency set up a time to come to the patient's home? no Were any new equipment or medical supplies ordered?  No What is the name of the medical supply agency? N/a Were you able to get the supplies/equipment? no Do you have any questions related to the use of the equipment or supplies? No  Functional Questionnaire: (I = Independent and D = Dependent) ADLs: I/d  Bathing/Dressing- I/d  Meal Prep- I/d  Eating- I/d  Maintaining continence- I/d  Transferring/Ambulation- I/d  Managing Meds- I/d  Follow up appointments reviewed:  PCP Hospital f/u appt confirmed? Yes  Scheduled to see robyn sanders on n/a @ n/a. [facility] f/u appt confirmed? No  Scheduled to see n/a on n/a @ n/a. Are transportation arrangements needed? No  If their condition worsens, is the pt aware to call PCP or go to the Emergency Dept.? Yes Was the patient provided with contact information for the PCP's office or ED? Yes Was to pt encouraged to call back with questions or concerns? Yes

## 2021-12-07 ENCOUNTER — Other Ambulatory Visit: Payer: Self-pay

## 2021-12-07 ENCOUNTER — Encounter: Payer: Self-pay | Admitting: Internal Medicine

## 2021-12-07 ENCOUNTER — Ambulatory Visit (INDEPENDENT_AMBULATORY_CARE_PROVIDER_SITE_OTHER): Payer: Medicare Other | Admitting: Internal Medicine

## 2021-12-07 VITALS — BP 162/70 | HR 75 | Temp 98.0°F | Ht 62.0 in | Wt 153.8 lb

## 2021-12-07 DIAGNOSIS — E7800 Pure hypercholesterolemia, unspecified: Secondary | ICD-10-CM

## 2021-12-07 DIAGNOSIS — I129 Hypertensive chronic kidney disease with stage 1 through stage 4 chronic kidney disease, or unspecified chronic kidney disease: Secondary | ICD-10-CM

## 2021-12-07 DIAGNOSIS — E1165 Type 2 diabetes mellitus with hyperglycemia: Secondary | ICD-10-CM

## 2021-12-07 DIAGNOSIS — N1832 Chronic kidney disease, stage 3b: Secondary | ICD-10-CM | POA: Diagnosis not present

## 2021-12-07 DIAGNOSIS — E1122 Type 2 diabetes mellitus with diabetic chronic kidney disease: Secondary | ICD-10-CM | POA: Diagnosis not present

## 2021-12-07 DIAGNOSIS — R269 Unspecified abnormalities of gait and mobility: Secondary | ICD-10-CM | POA: Diagnosis not present

## 2021-12-07 DIAGNOSIS — E1159 Type 2 diabetes mellitus with other circulatory complications: Secondary | ICD-10-CM | POA: Diagnosis not present

## 2021-12-07 DIAGNOSIS — I130 Hypertensive heart and chronic kidney disease with heart failure and stage 1 through stage 4 chronic kidney disease, or unspecified chronic kidney disease: Secondary | ICD-10-CM | POA: Diagnosis not present

## 2021-12-07 DIAGNOSIS — Z79899 Other long term (current) drug therapy: Secondary | ICD-10-CM

## 2021-12-07 MED ORDER — ATORVASTATIN CALCIUM 20 MG PO TABS: 20.0000 mg | ORAL_TABLET | Freq: Every day | ORAL | 2 refills | Status: DC

## 2021-12-07 NOTE — Progress Notes (Signed)
Barnet Glasgow Martin,acting as a Education administrator for Maximino Greenland, MD.,have documented all relevant documentation on the behalf of Maximino Greenland, MD,as directed by  Maximino Greenland, MD while in the presence of Maximino Greenland, MD.    Subjective:     Patient ID: [name] [name] , female    DOB: Feb 10, 1941 , 81 y.o.   MRN: [id number]   Chief Complaint  Patient presents with   [facility]      HPI  Patient presents today for a hospital f/u. She is present today with her daughter, [name].  Patient was admitted on 12/01/21 and discharged on 12/03/21, patient went to the hospital due to having slurred words, shakes, and stumbling, she later passed out. EMS was contacted and she was taken to the ER. The doctors told her that she was dehydrated and her kidney numbers went up.  She had full workup including MRI brain, carotid u/s and EEG - all tests without any acute findings. Patient states she is doing much better. Patient states she was also told she had pulled a muscle in her back.   BP Readings from Last 3 Encounters: 12/07/21 : (!) 160/80 12/03/21 : (!) 172/87 11/29/21 : (!) 142/80       Past Medical History:  Diagnosis Date   Diabetes mellitus ([place])    H/O: CVA (cerebrovascular accident) 2002   HTN (hypertension)    Hyperlipidemia    Pulmonary HTN ([facility])    mild by echo     Family History  Problem Relation Age of Onset   Hypertension Mother    Hypertension Father    Heart Problems Father    Hypertension Sister    Diabetes Sister    Hypertension Brother    Diabetes Brother    Breast cancer Neg Hx      Current Outpatient Medications:    ACCU-CHEK AVIVA PLUS test strip, TEST THREE TIMES DAILY AS DIRECTED, Disp: 100 strip, Rfl: 11   amLODipine (NORVASC) 5 MG tablet, Take 1 tablet (5 mg total) by mouth daily., Disp: 90 tablet, Rfl: 3   aspirin EC 81 MG tablet, Take 1 tablet (81 mg total) by mouth daily. (Patient taking differently: Take 325 mg by mouth daily.), Disp: 30  tablet, Rfl:    azelastine (ASTELIN) 0.1 % nasal spray, Place 1 spray into both nostrils 2 (two) times daily. Use in each nostril as directed, Disp: 30 mL, Rfl: 12   blood glucose meter kit and supplies, Dispense based on patient and insurance preference. Use up to four times daily as directed.dx code e11.65, Disp: 1 each, Rfl: 0   Blood Glucose Monitoring Suppl (TRUE METRIX METER) DEVI, Use to check blood sugars 3 times a day., Disp: 1 each, Rfl: 3   Cholecalciferol (VITAMIN D3) 50 MCG (2000 UT) TABS, Take 1 tablet by mouth daily at 12 noon., Disp: , Rfl:    Continuous Blood Gluc Receiver (DEXCOM G6 RECEIVER) DEVI, Use to check blood sugar 3 times a day before meals. Dx code e11.65, Disp: 3 each, Rfl: 9   Continuous Blood Gluc Sensor (DEXCOM G6 SENSOR) MISC, Use to check blood sugar 3 times a day before meals. Dx code e11.65, Disp: 3 each, Rfl: 9   Ferrous Sulfate (IRON PO), Take 65 mg by mouth daily., Disp: , Rfl:    Glucagon (GVOKE HYPOPEN 2-PACK) 0.5 MG/0.1ML SOAJ, Inject 0.5 mg into the skin daily as needed., Disp: 0.1 mL, Rfl: 3   glucose blood (TRUE  METRIX BLOOD GLUCOSE TEST) test strip, Use as instructed to check blood sugar 3 times a day. Dx code e11.65, Disp: 100 each, Rfl: 12   insulin degludec (TRESIBA FLEXTOUCH) 200 UNIT/ML FlexTouch Pen, Inject 20 Units into the skin daily., Disp: 9 mL, Rfl: 1   insulin lispro (HUMALOG KWIKPEN) 200 UNIT/ML KwikPen, inject 8-10 units into THE SKIN 15 minutes BEFORE THREE main meals of THE DAY (Patient taking differently: See admin instructions. inject 8-10 units into THE SKIN 15 minutes BEFORE THREE main meals of THE DAY), Disp: 9 mL, Rfl: 1   Lancets (ACCU-CHEK SOFT TOUCH) lancets, Check blood sugars 3 times daily E11.65, Disp: 100 each, Rfl: 12   latanoprost (XALATAN) 0.005 % ophthalmic solution, Place 1 drop into both eyes at bedtime. , Disp: , Rfl:    metoprolol succinate (TOPROL-XL) 25 MG 24 hr tablet, Take 0.5 tablets (12.5 mg total) by mouth daily.,  Disp: 45 tablet, Rfl: 3   Multiple Vitamin (MULTIVITAMIN) tablet, Take 1 tablet by mouth daily., Disp: , Rfl:    Semaglutide, 1 MG/DOSE, (OZEMPIC, 1 MG/DOSE,) 2 MG/1.5ML SOPN, Inject 1 mg into the skin once a week., Disp: 4.5 mL, Rfl: 1   atorvastatin (LIPITOR) 20 MG tablet, Take 1 tablet (20 mg total) by mouth daily., Disp: 90 tablet, Rfl: 2   B-D UF III MINI PEN NEEDLES 31G X 5 MM MISC, Use as directed 4x a day, Disp: 300 each, Rfl: 3   hydrALAZINE (APRESOLINE) 50 MG tablet, Take 1 tablet (50 mg total) by mouth 3 (three) times daily., Disp: 270 tablet, Rfl: 1   hydrOXYzine (ATARAX) 25 MG tablet, Take 1 tablet (25 mg total) by mouth 3 (three) times daily as needed for itching., Disp: 30 tablet, Rfl: 0   Allergies  Allergen Reactions   Brimonidine Other (See Comments)    Eyes swell and itch   Dorzolamide     Other reaction(s): Other (See Comments) Red, inflamed eyelid, dermatitis    Timolol Other (See Comments)    Caused eyes to swell and itch     Review of Systems  Constitutional: Negative.   Respiratory: Negative.    Cardiovascular: Negative.   Gastrointestinal: Negative.   Neurological: Negative.   Psychiatric/Behavioral: Negative.       Today's Vitals   12/07/21 1528 12/07/21 1555  BP: (!) 160/80 (!) 162/70  Pulse: 75   Temp: 98 F (36.7 C)   TempSrc: Oral   Weight: 153 lb 12.8 oz (69.8 kg)   Height: 5' 2" (1.575 m)   PainSc: 0-No pain    Body mass index is 28.13 kg/m.  Wt Readings from Last 3 Encounters:  12/22/21 165 lb 9.6 oz (75.1 kg)  12/07/21 153 lb 12.8 oz (69.8 kg)  11/29/21 162 lb (73.5 kg)    Objective:  Physical Exam Vitals and nursing note reviewed.  Constitutional:      Appearance: Normal appearance.  HENT:     Head: Normocephalic and atraumatic.  Eyes:     Extraocular Movements: Extraocular movements intact.  Cardiovascular:     Rate and Rhythm: Normal rate and regular rhythm.     Heart sounds: Normal heart sounds.  Pulmonary:     Effort:  Pulmonary effort is normal.     Breath sounds: Normal breath sounds.  Musculoskeletal:     Cervical back: Normal range of motion.  Skin:    General: Skin is warm.  Neurological:     General: No focal deficit present.     Mental Status:  She is alert.  Psychiatric:        Mood and Affect: Mood normal.        Behavior: Behavior normal.         Assessment And Plan:     1. Hypertensive heart and renal disease with renal failure, stage 1 through stage 4 or unspecified chronic kidney disease, with heart failure (HCC) Comments: TCM PERFORMED. A MEMBER OF THE CLINICAL TEAM SPOKE WITH THE PATIENT UPON DISCHARGE. DISCHARGE SUMMARY WAS REVIEWED IN FULL DETAIL DURING THE VISIT. MEDS RECONCILED AND COMPARED TO DISCHARGE MEDS. MEDICATION LIST WAS UPDATED AND REVIEWED WITH THE PATIENT. GREATER THAN 50% FACE TO FACE TIME WAS SPENT IN COUNSELING AND COORDINATION OF CARE. ALL QUESTIONS WERE ANSWERED TO THE SATISFACTION OF THE PATIENT. Chronic, uncontrolled. I will increase hydralazine to tid dosing. Importance of medication/dietary compliance was d/w patient.  - BMP8+EGFR - Ambulatory referral to Home Health  2. Stage 3b chronic kidney disease ([facility]) Comments: Chronic, also followed by Nephrology. Encouraged to keep all upcoming appointments.  - Ambulatory referral to Home Health  3. Poorly controlled type 2 diabetes mellitus with circulatory disorder [facility]) Comments: Chronic, last a1c August 2023 7.2. She was congratulated for this improvement in her glycemic control. She will c/w current meds.  - Ambulatory referral to [facility]  4. Pure hypercholesterolemia Comments: Chronic, LDL goal <70.  She will c/w atorvastatin 4m daily.  - Ambulatory referral to Home Health  5. Gait abnormality Comments: I will contact [facility] again to initiate home PT. - [facility] 6. Polypharmacy   Patient was given opportunity to ask questions. Patient verbalized understanding of the plan  and was able to repeat key elements of the plan. All questions were answered to their satisfaction.   I, RMaximino Greenland MD, have reviewed all documentation for this visit. The documentation on 12/07/21 for the exam, diagnosis, procedures, and orders are all accurate and complete.   IF YOU HAVE BEEN REFERRED TO A SPECIALIST, IT MAY TAKE 1-2 WEEKS TO SCHEDULE/PROCESS THE REFERRAL. IF YOU HAVE NOT HEARD FROM US/SPECIALIST IN TWO WEEKS, PLEASE GIVE [place] CALL AT [phone] X [phone].   THE PATIENT IS ENCOURAGED TO PRACTICE SOCIAL DISTANCING DUE TO THE COVID-19 PANDEMIC.

## 2021-12-07 NOTE — Patient Instructions (Signed)

## 2021-12-08 LAB — BMP8+EGFR
BUN/Creatinine Ratio: 17 (ref 12–28)
BUN: 62 mg/dL — ABNORMAL HIGH (ref 8–27)
CO2: 20 mmol/L (ref 20–29)
Calcium: 9.5 mg/dL (ref 8.7–10.3)
Chloride: 105 mmol/L (ref 96–106)
Creatinine, Ser: 3.71 mg/dL — ABNORMAL HIGH (ref 0.57–1.00)
Glucose: 202 mg/dL — ABNORMAL HIGH (ref 70–99)
Potassium: 4.3 mmol/L (ref 3.5–5.2)
Sodium: 144 mmol/L (ref 134–144)
eGFR: 12 mL/min/{1.73_m2} — ABNORMAL LOW (ref >59)

## 2021-12-10 DIAGNOSIS — I5032 Chronic diastolic (congestive) heart failure: Secondary | ICD-10-CM | POA: Diagnosis not present

## 2021-12-10 DIAGNOSIS — N184 Chronic kidney disease, stage 4 (severe): Secondary | ICD-10-CM | POA: Diagnosis not present

## 2021-12-10 DIAGNOSIS — I130 Hypertensive heart and chronic kidney disease with heart failure and stage 1 through stage 4 chronic kidney disease, or unspecified chronic kidney disease: Secondary | ICD-10-CM | POA: Diagnosis not present

## 2021-12-10 DIAGNOSIS — D631 Anemia in chronic kidney disease: Secondary | ICD-10-CM | POA: Diagnosis not present

## 2021-12-10 DIAGNOSIS — E1122 Type 2 diabetes mellitus with diabetic chronic kidney disease: Secondary | ICD-10-CM | POA: Diagnosis not present

## 2021-12-13 ENCOUNTER — Other Ambulatory Visit: Payer: Self-pay

## 2021-12-13 DIAGNOSIS — E1165 Type 2 diabetes mellitus with hyperglycemia: Secondary | ICD-10-CM

## 2021-12-13 MED ORDER — BD PEN NEEDLE MINI U/F 31G X 5 MM MISC: 3 refills | Status: DC

## 2021-12-14 ENCOUNTER — Telehealth: Payer: Self-pay

## 2021-12-14 ENCOUNTER — Ambulatory Visit: Payer: Medicare Other | Admitting: Internal Medicine

## 2021-12-14 DIAGNOSIS — I5032 Chronic diastolic (congestive) heart failure: Secondary | ICD-10-CM | POA: Diagnosis not present

## 2021-12-14 DIAGNOSIS — E1122 Type 2 diabetes mellitus with diabetic chronic kidney disease: Secondary | ICD-10-CM | POA: Diagnosis not present

## 2021-12-14 DIAGNOSIS — D631 Anemia in chronic kidney disease: Secondary | ICD-10-CM | POA: Diagnosis not present

## 2021-12-14 DIAGNOSIS — I130 Hypertensive heart and chronic kidney disease with heart failure and stage 1 through stage 4 chronic kidney disease, or unspecified chronic kidney disease: Secondary | ICD-10-CM | POA: Diagnosis not present

## 2021-12-14 DIAGNOSIS — N184 Chronic kidney disease, stage 4 (severe): Secondary | ICD-10-CM | POA: Diagnosis not present

## 2021-12-14 NOTE — Chronic Care Management (AMB) (Signed)
Chronic Care Management Pharmacy Assistant   Name: [name] [name]  MRN: [id number] DOB: 1941/01/30  Reason for Encounter: Medication Review/ Medication coordination  Recent office visits:  12-07-2021 Glend[place] Chard, MD. Glucose= 202, BUN= 62, Creatinine= 3.71, eGFR= 12. Referral placed for home health.  11-29-2021 [place] Chard, MD. A1C= 7.2. Microalb/Creat Ratio= 6,735. STOP rosuvastatin. START atorvastatin 20 mg daily.  10-19-2021 Kellie Simmering, LPN. Medicare wellness visit.  Recent consult visits:  11-04-2021 Troy Sine, MD (Cardiology). EKG completed. DECREASE amlodipine 10 mg daily TO 5 mg daily, metoprolol 25 mg daily TO 12.5 mg daily. CHANGE lasix 20 mg every 48 hours TO 20 mg daily and Hydralazine 37.5 mg twice daily TO 50 mg twice daily.  Hospital visits:  Medication Reconciliation was completed by comparing discharge summary, patient's EMR and Pharmacy list, and upon discussion with patient.  Admitted to the hospital on 12-01-2021 due to Syncope and collapse. Discharge date was 12-03-2021. Discharged from [facility]?Medications Started at [facility] Discharge:?? None  Medication Changes at Hospital Discharge: None  Medications Discontinued at Hospital Discharge: Lasix Flonase  Cetirizine  Medications that remain the same after Hospital Discharge:??  -All other medications will remain the same.   Hospital visits:  Medication Reconciliation was completed by comparing discharge summary, patient's EMR and Pharmacy list, and upon discussion with patient.  Admitted to the hospital on 11-24-2021 due to leg swelling.  Discharge date was 11-24-2021. Discharged from [facility]?Medications Started at [facility] Discharge:?? None  Medication Changes at Hospital Discharge: None  Medications Discontinued at Hospital Discharge: None  Medications that remain the same after Hospital Discharge:??  -All other medications will  remain the same.   Medications: Outpatient Encounter Medications as of 12/14/2021  Medication Sig Note   ACCU-CHEK AVIVA PLUS test strip TEST THREE TIMES DAILY AS DIRECTED    amLODipine (NORVASC) 5 MG tablet Take 1 tablet (5 mg total) by mouth daily.    aspirin EC 81 MG tablet Take 1 tablet (81 mg total) by mouth daily. (Patient taking differently: Take 325 mg by mouth daily.)    atorvastatin (LIPITOR) 20 MG tablet Take 1 tablet (20 mg total) by mouth daily.    azelastine (ASTELIN) 0.1 % nasal spray Place 1 spray into both nostrils 2 (two) times daily. Use in each nostril as directed    B-D UF III MINI PEN NEEDLES 31G X 5 MM MISC Use as directed 4x a day    blood glucose meter kit and supplies Dispense based on patient and insurance preference. Use up to four times daily as directed.dx code e11.65    Blood Glucose Monitoring Suppl (TRUE METRIX METER) DEVI Use to check blood sugars 3 times a day.    Cholecalciferol (VITAMIN D3) 50 MCG (2000 UT) TABS Take 1 tablet by mouth daily at 12 noon.    Continuous Blood Gluc Receiver (DEXCOM G6 RECEIVER) DEVI Use to check blood sugar 3 times a day before meals. Dx code e11.65    Continuous Blood Gluc Sensor (DEXCOM G6 SENSOR) MISC Use to check blood sugar 3 times a day before meals. Dx code e11.65    Ferrous Sulfate (IRON PO) Take 65 mg by mouth daily.    Glucagon (GVOKE HYPOPEN 2-PACK) 0.5 MG/0.1ML SOAJ Inject 0.5 mg into the skin daily as needed.    glucose blood (TRUE METRIX BLOOD GLUCOSE TEST) test strip Use as instructed to check blood sugar 3 times a day. Dx  code e11.65    hydrALAZINE (APRESOLINE) 50 MG tablet Take 1 tablet (50 mg total) by mouth 2 (two) times daily.    hydrOXYzine (ATARAX) 25 MG tablet Take 1 tablet (25 mg total) by mouth 3 (three) times daily as needed for itching.    insulin degludec (TRESIBA FLEXTOUCH) 200 UNIT/ML FlexTouch Pen Inject 20 Units into the skin daily.    insulin lispro (HUMALOG KWIKPEN) 200 UNIT/ML KwikPen inject 8-10  units into THE SKIN 15 minutes BEFORE THREE main meals of THE DAY (Patient taking differently: See admin instructions. inject 8-10 units into THE SKIN 15 minutes BEFORE THREE main meals of THE DAY)    Lancets (ACCU-CHEK SOFT TOUCH) lancets Check blood sugars 3 times daily E11.65    latanoprost (XALATAN) 0.005 % ophthalmic solution Place 1 drop into both eyes at bedtime.     metoprolol succinate (TOPROL-XL) 25 MG 24 hr tablet Take 0.5 tablets (12.5 mg total) by mouth daily.    Multiple Vitamin (MULTIVITAMIN) tablet Take 1 tablet by mouth daily.    Semaglutide, 1 MG/DOSE, (OZEMPIC, 1 MG/DOSE,) 2 MG/1.5ML SOPN Inject 1 mg into the skin once a week. 09/02/2021: Fridays   No facility-administered encounter medications on file as of 12/14/2021.  Reviewed chart for medication changes ahead of medication coordination call.  BP Readings from Last 3 Encounters:  12/07/21 (!) 162/70  12/03/21 (!) 172/87  11/29/21 (!) 142/80    Lab Results  Component Value Date   HGBA1C 7.2 (H) 11/29/2021     Patient obtains medications through Adherence Packaging  90 Days   Last adherence delivery included:  Hydralazine 25 mg 1.5 tabs at breakfast 1.5 tabs at bedtime Amlodipine 10 mg at breakfast Metoprolol 25 mg at breakfast  Rosuvastatin 20 mg breakfast  Aspirin 81 mg at breakfast- call cardiology Dr. Rob Hickman at [phone] Cetirizine 10 mg at breakfast Ozempic 1 mg weekly Tresiba 20 units daily- RX from PCP Latanoprost 1 drop in each eye at bedtime Lasix 20 mg once daily as needed  Patient declined (meds) last delivery: None  Patient is due for next adherence delivery on: 12-26-2021  Called patient and reviewed medications and coordinated delivery.  This delivery to include: Atorvastatin 20 mg at breakfast Tresiba 20 units daily Hydroxyzine 25 mg 1-2 tabs at bedtime PRN (Vials)  No acute/short fill needed  Patient's daughter declined the following medications: Amlodipine 10 mg- Plenty supply due to  decrease to 5 mg. Daughter is cutting pills in half Metoprolol 25 mg- Plenty supply due to decrease to 12.5 mg. Daughter is cutting pills in half Ozempic 1 mg weekly- Plenty supply Aspirin 81 mg- Plenty supply Rosuvastatin- D/C changed to atorvastatin Cetirizine- Plenty supply  Patient needs refills for: None  Confirmed delivery date of 12-26-2021 advised patient that pharmacy will contact them the morning of delivery.  NOTES: Patient daughter stated Dr. Baird Cancer was suppose to increase hydralazine to 50 mg 3 times daily. Unable to find medication change. Sent message to Dr. Baird Cancer to clarify.  Care Gaps: Yearly ophthalmology overdue Covid booster overdue Flu vaccine overdue  Star Rating Drugs: Ozempic 1 mg- Last filled 08-30-2021 [facility] Rosuvastatin 10 mg- Last filled 09-08-2021 [facility]  [employer] Clinical Pharmacist Assistant [phone]

## 2021-12-16 ENCOUNTER — Other Ambulatory Visit (HOSPITAL_BASED_OUTPATIENT_CLINIC_OR_DEPARTMENT_OTHER): Payer: Self-pay

## 2021-12-16 MED ORDER — HYDRALAZINE HCL 50 MG PO TABS: 50.0000 mg | ORAL_TABLET | Freq: Three times a day (TID) | ORAL | 1 refills | Status: DC

## 2021-12-16 MED ORDER — HYDROXYZINE HCL 25 MG PO TABS: 25.0000 mg | ORAL_TABLET | Freq: Three times a day (TID) | ORAL | 0 refills | Status: DC | PRN

## 2021-12-19 ENCOUNTER — Ambulatory Visit: Payer: Medicare Other | Admitting: Nurse Practitioner

## 2021-12-19 DIAGNOSIS — E1122 Type 2 diabetes mellitus with diabetic chronic kidney disease: Secondary | ICD-10-CM | POA: Diagnosis not present

## 2021-12-19 DIAGNOSIS — E11319 Type 2 diabetes mellitus with unspecified diabetic retinopathy without macular edema: Secondary | ICD-10-CM | POA: Diagnosis not present

## 2021-12-19 DIAGNOSIS — E11649 Type 2 diabetes mellitus with hypoglycemia without coma: Secondary | ICD-10-CM | POA: Diagnosis not present

## 2021-12-19 DIAGNOSIS — E1159 Type 2 diabetes mellitus with other circulatory complications: Secondary | ICD-10-CM | POA: Diagnosis not present

## 2021-12-19 DIAGNOSIS — N184 Chronic kidney disease, stage 4 (severe): Secondary | ICD-10-CM | POA: Diagnosis not present

## 2021-12-19 DIAGNOSIS — Z8673 Personal history of transient ischemic attack (TIA), and cerebral infarction without residual deficits: Secondary | ICD-10-CM | POA: Diagnosis not present

## 2021-12-19 DIAGNOSIS — Z7985 Long-term (current) use of injectable non-insulin antidiabetic drugs: Secondary | ICD-10-CM | POA: Diagnosis not present

## 2021-12-19 DIAGNOSIS — Z794 Long term (current) use of insulin: Secondary | ICD-10-CM | POA: Diagnosis not present

## 2021-12-19 NOTE — Progress Notes (Deleted)
Office Visit    Patient Name: [name] [name] Date of Encounter: 12/19/2021  Primary Care Provider:  Glendale Chard, MD Primary Cardiologist:  Shelva Majestic, MD  Chief Complaint    81 year old female with a history of chronic diastolic heart failure, hypertension, hyperlipidemia, type 2 diabetes, CKD stage IV, diabetic polyneuropathy, CVA, and syncope who presents for post-hospital follow-up related to syncope.  Past Medical History    Past Medical History:  Diagnosis Date   Diabetes mellitus ([place])    H/O: CVA (cerebrovascular accident) 2002   HTN (hypertension)    Hyperlipidemia    Pulmonary HTN ([place])    mild by echo   Past Surgical History:  Procedure Laterality Date   ABDOMINAL HYSTERECTOMY     CARDIAC CATHETERIZATION  03/17/2003   Normal, patent coronary arteries. Normal LV systolic function   CARDIOVASCULAR STRESS TEST  08/28/2006   Small sized, moderate ischemiain the Basal Inferoseptal, Basal Inferior, Mid Inferior and Apical Inferior regions   TRANSTHORACIC ECHOCARDIOGRAM  02/17/2010   EF >55%, moderate-severely thickened septum, mild-moderate mitral annular calcification    Allergies  Allergies  Allergen Reactions   Brimonidine Other (See Comments)    Eyes swell and itch   Dorzolamide     Other reaction(s): Other (See Comments) Red, inflamed eyelid, dermatitis    Timolol Other (See Comments)    Caused eyes to swell and itch    History of Present Illness    81 year old female with the above past medical history including chronic diastolic heart failure, hypertension, hyperlipidemia, type 2 diabetes, CKD stage IV, diabetic polyneuropathy, CVA, and syncope.  She has a history of remote CVA in 2002.  Cardiac catheterization 2004 revealed normal coronary arteries.  Stress test in May 2008 was negative for ischemia.  Echocardiogram in 2011 showed asymmetric LVH, G1 DD, increased LA pressure, mild/moderate MAC.  She reestablished care with Dr. Claiborne Billings in December  2022.  Echocardiogram in December 2022 showed EF 65 to 70%, G1 DD, moderate LVH, severe MAC, no aortic stenosis.  She was hospitalized in May 2023 in the setting of acute on chronic diastolic heart failure.  She was last seen in the office on 11/05/2018 and was stable from a cardiac standpoint.  He was elevated, she noted ongoing bilateral lower extremity edema.  The pain was decreased from 10 mg daily to 5 mg daily.  Lasix was increased to 20 mg daily.  He was increased to 50 mg twice daily.  She was hospitalized from 12/01/2021 to 12/03/2021 following a syncopal episode.  [place] she was noted to have slurred speech, tremors, stumbling. CT of the head was negative.  EEG was unremarkable, carotid Dopplers were unremarkable.  MRI of the brain was negative for acute abnormality.  Neurology was consulted.  It was suspected that her syncope was secondary to dehydration and orthostatic hypotension.  She was discharged home in stable condition on 12/03/2021.   She presents today for follow-up. Since her last visit  H/o syncope: Chronic diastolic heart failure: Hypertension: Hyperlipidemia: Type 2 diabetes: CKD stage IV: H/o CVA: Disposition:   Home Medications    Current Outpatient Medications  Medication Sig Dispense Refill   ACCU-CHEK AVIVA PLUS test strip TEST THREE TIMES DAILY AS DIRECTED 100 strip 11   amLODipine (NORVASC) 5 MG tablet Take 1 tablet (5 mg total) by mouth daily. 90 tablet 3   aspirin EC 81 MG tablet Take 1 tablet (81 mg total) by mouth daily. (Patient taking differently: Take 325 mg by mouth daily.)  30 tablet    atorvastatin (LIPITOR) 20 MG tablet Take 1 tablet (20 mg total) by mouth daily. 90 tablet 2   azelastine (ASTELIN) 0.1 % nasal spray Place 1 spray into both nostrils 2 (two) times daily. Use in each nostril as directed 30 mL 12   B-D UF III MINI PEN NEEDLES 31G X 5 MM MISC Use as directed 4x a day 300 each 3   blood glucose meter kit and supplies Dispense based on patient  and insurance preference. Use up to four times daily as directed.dx code e11.65 1 each 0   Blood Glucose Monitoring Suppl (TRUE METRIX METER) DEVI Use to check blood sugars 3 times a day. 1 each 3   Cholecalciferol (VITAMIN D3) 50 MCG (2000 UT) TABS Take 1 tablet by mouth daily at 12 noon.     Continuous Blood Gluc Receiver (DEXCOM G6 RECEIVER) DEVI Use to check blood sugar 3 times a day before meals. Dx code e11.65 3 each 9   Continuous Blood Gluc Sensor (DEXCOM G6 SENSOR) MISC Use to check blood sugar 3 times a day before meals. Dx code e11.65 3 each 9   Ferrous Sulfate (IRON PO) Take 65 mg by mouth daily.     Glucagon (GVOKE HYPOPEN 2-PACK) 0.5 MG/0.1ML SOAJ Inject 0.5 mg into the skin daily as needed. 0.1 mL 3   glucose blood (TRUE METRIX BLOOD GLUCOSE TEST) test strip Use as instructed to check blood sugar 3 times a day. Dx code e11.65 100 each 12   hydrALAZINE (APRESOLINE) 50 MG tablet Take 1 tablet (50 mg total) by mouth 3 (three) times daily. 270 tablet 1   hydrOXYzine (ATARAX) 25 MG tablet Take 1 tablet (25 mg total) by mouth 3 (three) times daily as needed for itching. 30 tablet 0   insulin degludec (TRESIBA FLEXTOUCH) 200 UNIT/ML FlexTouch Pen Inject 20 Units into the skin daily. 9 mL 1   insulin lispro (HUMALOG KWIKPEN) 200 UNIT/ML KwikPen inject 8-10 units into THE SKIN 15 minutes BEFORE THREE main meals of THE DAY (Patient taking differently: See admin instructions. inject 8-10 units into THE SKIN 15 minutes BEFORE THREE main meals of THE DAY) 9 mL 1   Lancets (ACCU-CHEK SOFT TOUCH) lancets Check blood sugars 3 times daily E11.65 100 each 12   latanoprost (XALATAN) 0.005 % ophthalmic solution Place 1 drop into both eyes at bedtime.      metoprolol succinate (TOPROL-XL) 25 MG 24 hr tablet Take 0.5 tablets (12.5 mg total) by mouth daily. 45 tablet 3   Multiple Vitamin (MULTIVITAMIN) tablet Take 1 tablet by mouth daily.     Semaglutide, 1 MG/DOSE, (OZEMPIC, 1 MG/DOSE,) 2 MG/1.5ML SOPN  Inject 1 mg into the skin once a week. 4.5 mL 1   No current facility-administered medications for this visit.     Review of Systems    ***.  All other systems reviewed and are otherwise negative except as noted above.    Physical Exam    VS:  There were no vitals taken for this visit. , BMI There is no height or weight on file to calculate BMI.     GEN: Well nourished, well developed, in no acute distress. HEENT: normal. Neck: Supple, no JVD, carotid bruits, or masses. Cardiac: RRR, no murmurs, rubs, or gallops. No clubbing, cyanosis, edema.  Radials/DP/PT 2+ and equal bilaterally.  Respiratory:  Respirations regular and unlabored, clear to auscultation bilaterally. GI: Soft, nontender, nondistended, BS + x 4. MS: no deformity or atrophy.  Skin: warm and dry, no rash. Neuro:  Strength and sensation are intact. Psych: Normal affect.  Accessory Clinical Findings    ECG personally reviewed by me today - *** - no acute changes.   Lab Results  Component Value Date   WBC 10.0 12/03/2021   HGB 9.6 (L) 12/03/2021   HCT 28.8 (L) 12/03/2021   MCV 90.0 12/03/2021   PLT 342 12/03/2021   Lab Results  Component Value Date   CREATININE 3.71 (H) 12/07/2021   BUN 62 (H) 12/07/2021   NA 144 12/07/2021   K 4.3 12/07/2021   CL 105 12/07/2021   CO2 20 12/07/2021   Lab Results  Component Value Date   ALT 11 12/02/2021   AST 14 (L) 12/02/2021   ALKPHOS 65 12/02/2021   BILITOT 0.5 12/02/2021   Lab Results  Component Value Date   CHOL 166 08/30/2021   HDL 40 08/30/2021   LDLCALC 101 (H) 08/30/2021   TRIG 143 08/30/2021   CHOLHDL 4.2 08/30/2021    Lab Results  Component Value Date   HGBA1C 7.2 (H) 11/29/2021    Assessment & Plan    1.  ***  No BP recorded.  {Refresh Note OR Click here to enter BP  :1}***   Lenna Sciara, NP 12/19/2021, 5:04 AM

## 2021-12-20 DIAGNOSIS — E1122 Type 2 diabetes mellitus with diabetic chronic kidney disease: Secondary | ICD-10-CM | POA: Diagnosis not present

## 2021-12-20 DIAGNOSIS — N184 Chronic kidney disease, stage 4 (severe): Secondary | ICD-10-CM | POA: Diagnosis not present

## 2021-12-20 DIAGNOSIS — D631 Anemia in chronic kidney disease: Secondary | ICD-10-CM | POA: Diagnosis not present

## 2021-12-20 DIAGNOSIS — I130 Hypertensive heart and chronic kidney disease with heart failure and stage 1 through stage 4 chronic kidney disease, or unspecified chronic kidney disease: Secondary | ICD-10-CM | POA: Diagnosis not present

## 2021-12-20 DIAGNOSIS — I5032 Chronic diastolic (congestive) heart failure: Secondary | ICD-10-CM | POA: Diagnosis not present

## 2021-12-21 DIAGNOSIS — L718 Other rosacea: Secondary | ICD-10-CM | POA: Diagnosis not present

## 2021-12-22 ENCOUNTER — Encounter: Payer: Self-pay | Admitting: Internal Medicine

## 2021-12-22 ENCOUNTER — Encounter: Payer: Self-pay | Admitting: Nurse Practitioner

## 2021-12-22 ENCOUNTER — Ambulatory Visit: Payer: Medicare Other | Attending: Nurse Practitioner | Admitting: Nurse Practitioner

## 2021-12-22 VITALS — BP 138/62 | HR 73 | Ht 62.0 in | Wt 165.6 lb

## 2021-12-22 DIAGNOSIS — E1122 Type 2 diabetes mellitus with diabetic chronic kidney disease: Secondary | ICD-10-CM | POA: Diagnosis not present

## 2021-12-22 DIAGNOSIS — I5032 Chronic diastolic (congestive) heart failure: Secondary | ICD-10-CM

## 2021-12-22 DIAGNOSIS — D631 Anemia in chronic kidney disease: Secondary | ICD-10-CM | POA: Diagnosis not present

## 2021-12-22 DIAGNOSIS — E785 Hyperlipidemia, unspecified: Secondary | ICD-10-CM | POA: Diagnosis not present

## 2021-12-22 DIAGNOSIS — I10 Essential (primary) hypertension: Secondary | ICD-10-CM | POA: Diagnosis not present

## 2021-12-22 DIAGNOSIS — Z794 Long term (current) use of insulin: Secondary | ICD-10-CM | POA: Diagnosis not present

## 2021-12-22 DIAGNOSIS — Z87898 Personal history of other specified conditions: Secondary | ICD-10-CM

## 2021-12-22 DIAGNOSIS — N184 Chronic kidney disease, stage 4 (severe): Secondary | ICD-10-CM

## 2021-12-22 DIAGNOSIS — I130 Hypertensive heart and chronic kidney disease with heart failure and stage 1 through stage 4 chronic kidney disease, or unspecified chronic kidney disease: Secondary | ICD-10-CM | POA: Diagnosis not present

## 2021-12-22 DIAGNOSIS — Z8673 Personal history of transient ischemic attack (TIA), and cerebral infarction without residual deficits: Secondary | ICD-10-CM | POA: Diagnosis not present

## 2021-12-22 DIAGNOSIS — E118 Type 2 diabetes mellitus with unspecified complications: Secondary | ICD-10-CM

## 2021-12-22 NOTE — Progress Notes (Signed)
Office Visit    Patient Name: [name] [name] Date of Encounter: 12/22/2021  Primary Care Provider:  Glendale Chard, MD Primary Cardiologist:  Shelva Majestic, MD  Chief Complaint    81 year old female with a history of chronic diastolic heart failure, hypertension, hyperlipidemia, type 2 diabetes, CKD stage IV, diabetic polyneuropathy, CVA, and syncope who presents for post-hospital follow-up related to syncope.  Past Medical History    Past Medical History:  Diagnosis Date   Diabetes mellitus ([place])    H/O: CVA (cerebrovascular accident) 2002   HTN (hypertension)    Hyperlipidemia    Pulmonary HTN ([place])    mild by echo   Past Surgical History:  Procedure Laterality Date   ABDOMINAL HYSTERECTOMY     CARDIAC CATHETERIZATION  03/17/2003   Normal, patent coronary arteries. Normal LV systolic function   CARDIOVASCULAR STRESS TEST  08/28/2006   Small sized, moderate ischemiain the Basal Inferoseptal, Basal Inferior, Mid Inferior and Apical Inferior regions   TRANSTHORACIC ECHOCARDIOGRAM  02/17/2010   EF >55%, moderate-severely thickened septum, mild-moderate mitral annular calcification    Allergies  Allergies  Allergen Reactions   Brimonidine Other (See Comments)    Eyes swell and itch   Dorzolamide     Other reaction(s): Other (See Comments) Red, inflamed eyelid, dermatitis    Timolol Other (See Comments)    Caused eyes to swell and itch    History of Present Illness    81 year old female with the above past medical history including chronic diastolic heart failure, hypertension, hyperlipidemia, type 2 diabetes, CKD stage IV, diabetic polyneuropathy, CVA, and syncope.   She has a history of remote CVA in 2002.  Cardiac catheterization 2004 revealed normal coronary arteries.  Stress test in May 2008 was negative for ischemia.  Echocardiogram in 2011 showed asymmetric LVH, G1 DD, increased LA pressure, mild/moderate MAC.  She reestablished care with Dr. Claiborne Billings in December  2022.  Echocardiogram in December 2022 showed EF 65 to 70%, G1 DD, moderate LVH, severe MAC, no aortic stenosis.  She was hospitalized in May 2023 in the setting of acute on chronic diastolic heart failure.  She was last seen in the office on 11/05/2018 and was stable from a cardiac standpoint. BNP was elevated, she noted ongoing bilateral lower extremity edema.  Amlodipine was decreased from 10 mg daily to 5 mg daily.  Lasix was increased to 20 mg daily. Hydralazine was increased to 50 mg twice daily.  She was hospitalized from 12/01/2021 to 12/03/2021 following a syncopal episode.  Upon admission she was noted to have slurred speech, tremors, stumbling. CT of the head was negative.  EEG was unremarkable, carotid dopplers were unremarkable.  Repeat echocardiogram showed EF 60 to 65%, no RWMA, mild LVH, indeterminate diastolic parameters, normal RV systolic function, mild to moderate mitral stenosis, moderate to severe MAC, no significant change from prior study.  Neurology was consulted.  MRI of the brain was negative for acute abnormality.  It was suspected that her syncope was secondary to dehydration and orthostatic hypotension.  She was discharged home in stable condition on 12/03/2021.     She presents today for follow-up accompanied by her daughter. Since her last visit has done well from a cardiac standpoint.  She denies any recurrent presyncope, syncope.  Denies palpitations, dyspnea, edema, weight gain, pnd, orthopnea, or symptoms concerning for angina.  BP has been stable.  She no longer drives.  She has follow-up scheduled with neurology next week.  Overall, she reports feeling well denies  any additional concerns today.  Home Medications    Current Outpatient Medications  Medication Sig Dispense Refill   ACCU-CHEK AVIVA PLUS test strip TEST THREE TIMES DAILY AS DIRECTED 100 strip 11   amLODipine (NORVASC) 5 MG tablet Take 1 tablet (5 mg total) by mouth daily. 90 tablet 3   aspirin EC 81 MG tablet  Take 1 tablet (81 mg total) by mouth daily. (Patient taking differently: Take 325 mg by mouth daily.) 30 tablet    atorvastatin (LIPITOR) 20 MG tablet Take 1 tablet (20 mg total) by mouth daily. 90 tablet 2   azelastine (ASTELIN) 0.1 % nasal spray Place 1 spray into both nostrils 2 (two) times daily. Use in each nostril as directed 30 mL 12   B-D UF III MINI PEN NEEDLES 31G X 5 MM MISC Use as directed 4x a day 300 each 3   blood glucose meter kit and supplies Dispense based on patient and insurance preference. Use up to four times daily as directed.dx code e11.65 1 each 0   Blood Glucose Monitoring Suppl (TRUE METRIX METER) DEVI Use to check blood sugars 3 times a day. 1 each 3   Cholecalciferol (VITAMIN D3) 50 MCG (2000 UT) TABS Take 1 tablet by mouth daily at 12 noon.     Continuous Blood Gluc Receiver (DEXCOM G6 RECEIVER) DEVI Use to check blood sugar 3 times a day before meals. Dx code e11.65 3 each 9   Continuous Blood Gluc Sensor (DEXCOM G6 SENSOR) MISC Use to check blood sugar 3 times a day before meals. Dx code e11.65 3 each 9   Ferrous Sulfate (IRON PO) Take 65 mg by mouth daily.     Glucagon (GVOKE HYPOPEN 2-PACK) 0.5 MG/0.1ML SOAJ Inject 0.5 mg into the skin daily as needed. 0.1 mL 3   glucose blood (TRUE METRIX BLOOD GLUCOSE TEST) test strip Use as instructed to check blood sugar 3 times a day. Dx code e11.65 100 each 12   hydrALAZINE (APRESOLINE) 50 MG tablet Take 1 tablet (50 mg total) by mouth 3 (three) times daily. 270 tablet 1   hydrOXYzine (ATARAX) 25 MG tablet Take 1 tablet (25 mg total) by mouth 3 (three) times daily as needed for itching. 30 tablet 0   insulin degludec (TRESIBA FLEXTOUCH) 200 UNIT/ML FlexTouch Pen Inject 20 Units into the skin daily. 9 mL 1   insulin lispro (HUMALOG KWIKPEN) 200 UNIT/ML KwikPen inject 8-10 units into THE SKIN 15 minutes BEFORE THREE main meals of THE DAY (Patient taking differently: See admin instructions. inject 8-10 units into THE SKIN 15  minutes BEFORE THREE main meals of THE DAY) 9 mL 1   Lancets (ACCU-CHEK SOFT TOUCH) lancets Check blood sugars 3 times daily E11.65 100 each 12   latanoprost (XALATAN) 0.005 % ophthalmic solution Place 1 drop into both eyes at bedtime.      metoprolol succinate (TOPROL-XL) 25 MG 24 hr tablet Take 0.5 tablets (12.5 mg total) by mouth daily. 45 tablet 3   Multiple Vitamin (MULTIVITAMIN) tablet Take 1 tablet by mouth daily.     Semaglutide, 1 MG/DOSE, (OZEMPIC, 1 MG/DOSE,) 2 MG/1.5ML SOPN Inject 1 mg into the skin once a week. 4.5 mL 1   No current facility-administered medications for this visit.     Review of Systems    She denies chest pain, palpitations, dyspnea, pnd, orthopnea, n, v, dizziness, syncope, edema, weight gain, or early satiety. All other systems reviewed and are otherwise negative except as noted above.  Physical Exam    VS:  BP 138/62   Pulse 73   Ht _0  (1.575 m)   Wt 165 lb 9.6 oz (75.1 kg)   SpO2 95%   BMI 30.29 kg/m   GEN: Well nourished, well developed, in no acute distress. HEENT: normal. Neck: Supple, no JVD, carotid bruits, or masses. Cardiac: RRR, no murmurs, rubs, or gallops. No clubbing, cyanosis, edema.  Radials/DP/PT 2+ and equal bilaterally.  Respiratory:  Respirations regular and unlabored, clear to auscultation bilaterally. GI: Soft, nontender, nondistended, BS + x 4. MS: no deformity or atrophy. Skin: warm and dry, no rash. Neuro:  Strength and sensation are intact. Psych: Normal affect.  Accessory Clinical Findings    ECG personally reviewed by me today - No EKG in office today.     Lab Results  Component Value Date   WBC 10.0 12/03/2021   HGB 9.6 (L) 12/03/2021   HCT 28.8 (L) 12/03/2021   MCV 90.0 12/03/2021   PLT 342 12/03/2021   Lab Results  Component Value Date   CREATININE 3.71 (H) 12/07/2021   BUN 62 (H) 12/07/2021   NA 144 12/07/2021   K 4.3 12/07/2021   CL 105 12/07/2021   CO2 20 12/07/2021   Lab Results  Component  Value Date   ALT 11 12/02/2021   AST 14 (L) 12/02/2021   ALKPHOS 65 12/02/2021   BILITOT 0.5 12/02/2021   Lab Results  Component Value Date   CHOL 166 08/30/2021   HDL 40 08/30/2021   LDLCALC 101 (H) 08/30/2021   TRIG 143 08/30/2021   CHOLHDL 4.2 08/30/2021    Lab Results  Component Value Date   HGBA1C 7.2 (H) 11/29/2021    Assessment & Plan    1. H/o syncope: Work-up to date including CT of the head, EEG, carotid Dopplers, echocardiogram, and MRI of the brain were unremarkable.  She denies any recurrent syncope, presyncope, dizziness or palpitations. Will check 30 day event monitor.  Discussed ED precautions, adequate hydration, gradual position changes.  Follow-up with neurology as planned.  2. Chronic diastolic heart failure: No longer taking Lasix. Euvolemic and well compensated on exam.  Continue metoprolol.   3. Hypertension: BP well controlled. Continue current antihypertensive regimen.   4. Hyperlipidemia: LDL was 101 in 08/2021. Continue ASA, Lipitor.   5. Type 2 diabetes: A1C was 8.4 in 08/2021. Monitored and managed per PCP.   6. CKD stage IV: Creatinine was stabl eat 3.71 on 12/07/2021.   7. H/o CVA: Recent MRI/CT head were negative. Following with neurology. Continue ASA, Lipitor.   8. Disposition: Follow-up in 6 months with Dr. Claiborne Billings, sooner if needed.      Lenna Sciara, NP 12/22/2021, 12:34 PM

## 2021-12-22 NOTE — Patient Instructions (Signed)
Medication Instructions:  No Changes *If you need a refill on your cardiac medications before your next appointment, please call your pharmacy*   Lab Work: No Labs If you have labs (blood work) drawn today and your tests are completely normal, you will receive your results only by: [place] (if you have MyChart) OR A paper copy in the mail If you have any lab test that is abnormal or we need to change your treatment, we will call you to review the results.   Testing/Procedures: [employer] Cardiac Event Monitor Instructions Your physician has requested you wear your cardiac event monitor for 30 days, . [employer] may call or text to confirm a shipping address. The monitor will be sent to a land address via UPS. [employer] will not ship a monitor to a PO BOX. It typically takes 3-5 days to receive your monitor after it has been enrolled. [employer] will assist with USPS tracking if your package is delayed. The telephone number for [employer] is [phone]. Once you have received your monitor, please review the enclosed instructions. Instruction tutorials can also be viewed under help and settings on the enclosed cell phone. Your monitor has already been registered assigning a specific monitor serial # to you.  Applying the monitor Remove cell phone from case and turn it on. The cell phone works as Dealer and needs to be within [employer] of you at all times. The cell phone will need to be charged on a daily basis. We recommend you plug the cell phone into the enclosed charger at your bedside table every night.  Monitor batteries: You will receive two monitor batteries labelled #1 and #2. These are your recorders. Plug battery #2 onto the second connection on the enclosed charger. Keep one battery on the charger at all times. This will keep the monitor battery deactivated. It will also keep it fully charged for when you need to switch your monitor batteries. A small  light will be blinking on the battery emblem when it is charging. The light on the battery emblem will remain on when the battery is fully charged.  Open package of a Monitor strip. Insert battery #1 into black hood on strip and gently squeeze monitor battery onto connection as indicated in instruction booklet. Set aside while preparing skin.  Choose location for your strip, vertical or horizontal, as indicated in the instruction booklet. Shave to remove all hair from location. There cannot be any lotions, oils, powders, or colognes on skin where monitor is to be applied. Wipe skin clean with enclosed Saline wipe. Dry skin completely.  Peel paper labeled #1 off the back of the Monitor strip exposing the adhesive. Place the monitor on the chest in the vertical or horizontal position shown in the instruction booklet. One arrow on the monitor strip must be pointing upward. Carefully remove paper labeled #2, attaching remainder of strip to your skin. Try not to create any folds or wrinkles in the strip as you apply it.  Firmly press and release the circle in the center of the monitor battery. You will hear a small beep. This is turning the monitor battery on. The heart emblem on the monitor battery will light up every 5 seconds if the monitor battery in turned on and connected to the patient securely. Do not push and hold the circle down as this turns the monitor battery off. The cell phone will locate the monitor battery. A screen will appear on the cell phone checking the connection of  your monitor strip. This may read poor connection initially but change to good connection within the next minute. Once your monitor accepts the connection you will hear a series of 3 beeps followed by a climbing crescendo of beeps. A screen will appear on the cell phone showing the two monitor strip placement options. Touch the picture that demonstrates where you applied the monitor strip.  Your monitor strip and  battery are waterproof. You are able to shower, bathe, or swim with the monitor on. They just ask you do not submerge deeper than 3 feet underwater. We recommend removing the monitor if you are swimming in a lake, river, or ocean.  Your monitor battery will need to be switched to a fully charged monitor battery approximately once a week. The cell phone will alert you of an action which needs to be made.  On the cell phone, tap for details to reveal connection status, monitor battery status, and cell phone battery status. The green dots indicates your monitor is in good status. A red dot indicates there is something that needs your attention.  To record a symptom, click the circle on the monitor battery. In 30-60 seconds a list of symptoms will appear on the cell phone. Select your symptom and tap save. Your monitor will record a sustained or significant arrhythmia regardless of you clicking the button. Some patients do not feel the heart rhythm irregularities. [employer] will notify us of any serious or critical events.  Refer to instruction booklet for instructions on switching batteries, changing strips, the Do not disturb or Pause features, or any additional questions.  Call [employer] at [phone], to confirm your monitor is transmitting and record your baseline. They will answer any questions you may have regarding the monitor instructions at that time.  Returning the monitor to [place] all equipment back into blue box. Peel off strip of paper to expose adhesive and close box securely. There is a prepaid UPS shipping label on this box. Drop in a UPS drop box, or at a UPS facility like [employer]. You may also contact [employer] to arrange UPS to pick up monitor package at your home.    Follow-Up: At [facility], you and your health needs are our priority.  As part of our continuing mission to provide you with exceptional heart care, we have created designated  Provider Care Teams.  These Care Teams include your primary Cardiologist (physician) and Advanced Practice Providers (APPs -  Physician Assistants and Nurse Practitioners) who all work together to provide you with the care you need, when you need it.  We recommend signing up for the patient portal called "MyChart".  Sign up information is provided on this After Visit Summary.  MyChart is used to connect with patients for Virtual Visits (Telemedicine).  Patients are able to view lab/test results, encounter notes, upcoming appointments, etc.  Non-urgent messages can be sent to your provider as well.   To learn more about what you can do with MyChart, go to [email or web address].    Your next appointment:   6 month(s)  The format for your next appointment:   In Person  Provider:   Shelva Majestic, MD

## 2021-12-26 ENCOUNTER — Other Ambulatory Visit (INDEPENDENT_AMBULATORY_CARE_PROVIDER_SITE_OTHER): Payer: Medicare Other | Admitting: Internal Medicine

## 2021-12-26 DIAGNOSIS — I5032 Chronic diastolic (congestive) heart failure: Secondary | ICD-10-CM | POA: Diagnosis not present

## 2021-12-26 DIAGNOSIS — N1832 Chronic kidney disease, stage 3b: Secondary | ICD-10-CM

## 2021-12-26 DIAGNOSIS — R269 Unspecified abnormalities of gait and mobility: Secondary | ICD-10-CM

## 2021-12-26 DIAGNOSIS — Z9181 History of falling: Secondary | ICD-10-CM | POA: Diagnosis not present

## 2021-12-26 DIAGNOSIS — I130 Hypertensive heart and chronic kidney disease with heart failure and stage 1 through stage 4 chronic kidney disease, or unspecified chronic kidney disease: Secondary | ICD-10-CM | POA: Diagnosis not present

## 2021-12-26 DIAGNOSIS — E162 Hypoglycemia, unspecified: Secondary | ICD-10-CM | POA: Insufficient documentation

## 2021-12-26 DIAGNOSIS — E1159 Type 2 diabetes mellitus with other circulatory complications: Secondary | ICD-10-CM

## 2021-12-26 DIAGNOSIS — N179 Acute kidney failure, unspecified: Secondary | ICD-10-CM | POA: Diagnosis not present

## 2021-12-26 DIAGNOSIS — Z7982 Long term (current) use of aspirin: Secondary | ICD-10-CM | POA: Diagnosis not present

## 2021-12-26 DIAGNOSIS — E1165 Type 2 diabetes mellitus with hyperglycemia: Secondary | ICD-10-CM

## 2021-12-26 HISTORY — DX: Unspecified abnormalities of gait and mobility: R26.9

## 2021-12-26 HISTORY — DX: Chronic kidney disease, stage 3b: N18.32

## 2021-12-26 HISTORY — DX: History of falling: Z91.81

## 2021-12-26 NOTE — Progress Notes (Signed)
CC: home health certification  Received home health orders orders from [facility]. Start of care 12/10/21.   Certification and orders from 12/10/21 through 02/07/22 are reviewed, signed and faxed back to home health company.  Need of intermittent skilled services at home: SN, PT  The home health care plan has been established by me and will be reviewed and updated as needed to maximize patient recovery.  I certify that all home health services have been and will be furnished to the patient while under my care.  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

## 2021-12-27 DIAGNOSIS — E1122 Type 2 diabetes mellitus with diabetic chronic kidney disease: Secondary | ICD-10-CM | POA: Diagnosis not present

## 2021-12-27 DIAGNOSIS — N184 Chronic kidney disease, stage 4 (severe): Secondary | ICD-10-CM | POA: Diagnosis not present

## 2021-12-27 DIAGNOSIS — D631 Anemia in chronic kidney disease: Secondary | ICD-10-CM | POA: Diagnosis not present

## 2021-12-27 DIAGNOSIS — I130 Hypertensive heart and chronic kidney disease with heart failure and stage 1 through stage 4 chronic kidney disease, or unspecified chronic kidney disease: Secondary | ICD-10-CM | POA: Diagnosis not present

## 2021-12-27 DIAGNOSIS — I5032 Chronic diastolic (congestive) heart failure: Secondary | ICD-10-CM | POA: Diagnosis not present

## 2021-12-27 NOTE — Telephone Encounter (Signed)
Patient seen in office 09/14

## 2021-12-28 DIAGNOSIS — N184 Chronic kidney disease, stage 4 (severe): Secondary | ICD-10-CM | POA: Diagnosis not present

## 2021-12-28 DIAGNOSIS — I130 Hypertensive heart and chronic kidney disease with heart failure and stage 1 through stage 4 chronic kidney disease, or unspecified chronic kidney disease: Secondary | ICD-10-CM | POA: Diagnosis not present

## 2021-12-28 DIAGNOSIS — D631 Anemia in chronic kidney disease: Secondary | ICD-10-CM | POA: Diagnosis not present

## 2021-12-28 DIAGNOSIS — E1122 Type 2 diabetes mellitus with diabetic chronic kidney disease: Secondary | ICD-10-CM | POA: Diagnosis not present

## 2021-12-28 DIAGNOSIS — I5032 Chronic diastolic (congestive) heart failure: Secondary | ICD-10-CM | POA: Diagnosis not present

## 2021-12-29 ENCOUNTER — Ambulatory Visit: Payer: Medicare Other | Admitting: Neurology

## 2022-01-04 DIAGNOSIS — I5032 Chronic diastolic (congestive) heart failure: Secondary | ICD-10-CM | POA: Diagnosis not present

## 2022-01-04 DIAGNOSIS — E1122 Type 2 diabetes mellitus with diabetic chronic kidney disease: Secondary | ICD-10-CM | POA: Diagnosis not present

## 2022-01-04 DIAGNOSIS — N184 Chronic kidney disease, stage 4 (severe): Secondary | ICD-10-CM | POA: Diagnosis not present

## 2022-01-04 DIAGNOSIS — I130 Hypertensive heart and chronic kidney disease with heart failure and stage 1 through stage 4 chronic kidney disease, or unspecified chronic kidney disease: Secondary | ICD-10-CM | POA: Diagnosis not present

## 2022-01-04 DIAGNOSIS — D631 Anemia in chronic kidney disease: Secondary | ICD-10-CM | POA: Diagnosis not present

## 2022-01-12 DIAGNOSIS — E113311 Type 2 diabetes mellitus with moderate nonproliferative diabetic retinopathy with macular edema, right eye: Secondary | ICD-10-CM | POA: Diagnosis not present

## 2022-01-12 DIAGNOSIS — Z794 Long term (current) use of insulin: Secondary | ICD-10-CM | POA: Diagnosis not present

## 2022-01-12 DIAGNOSIS — H401133 Primary open-angle glaucoma, bilateral, severe stage: Secondary | ICD-10-CM | POA: Diagnosis not present

## 2022-01-12 DIAGNOSIS — H34812 Central retinal vein occlusion, left eye, with macular edema: Secondary | ICD-10-CM | POA: Diagnosis not present

## 2022-01-12 DIAGNOSIS — H4052X3 Glaucoma secondary to other eye disorders, left eye, severe stage: Secondary | ICD-10-CM | POA: Diagnosis not present

## 2022-01-12 LAB — HM DIABETES EYE EXAM

## 2022-01-13 ENCOUNTER — Telehealth: Payer: Self-pay | Admitting: Cardiovascular Disease

## 2022-01-13 ENCOUNTER — Encounter: Payer: Self-pay | Admitting: Cardiovascular Disease

## 2022-01-13 NOTE — Telephone Encounter (Signed)
Pt c/o swelling: STAT is pt has developed SOB within 24 hours  If swelling, where is the swelling located? Hands and feet, not bad  How much weight have you gained and in what time span? Not sure  Have you gained 3 pounds in a day or 5 pounds in a week? Not sure  Do you have a log of your daily weights (if so, list)? 154 lbs normally, yesterday 159 lbs  Are you currently taking a fluid pill? yes  Are you currently SOB? no  Have you traveled recently? no  Patient's daughter requesting a call back as soon as possible to advise if the patient should increase her lasix. She says the patient's hands and feet have been swollen for 2-3 days. She says the swelling is not that bad, but she also is congested. She says last time the patient had swelling she got congested.

## 2022-01-13 NOTE — Telephone Encounter (Signed)
LMTCB

## 2022-01-17 ENCOUNTER — Encounter: Payer: Self-pay | Admitting: Neurology

## 2022-01-17 ENCOUNTER — Ambulatory Visit: Payer: Medicare Other | Admitting: Neurology

## 2022-01-17 VITALS — BP 178/53 | HR 81 | Ht 62.0 in | Wt 166.0 lb

## 2022-01-17 DIAGNOSIS — R55 Syncope and collapse: Secondary | ICD-10-CM

## 2022-01-17 NOTE — Progress Notes (Signed)
[facility]  PATIENT: [name] [name] DOB: Jan 25, 1941  REQUESTING CLINICIAN: Hosie Poisson, MD HISTORY FROM: Patient and daughter  REASON FOR VISIT: Syncope    HISTORICAL  CHIEF COMPLAINT:  Chief Complaint  Patient presents with   New Patient (Initial Visit)    Rm 13. Accompanied by daughter. NP internal referral for Syncope and collapse.    HISTORY OF PRESENT ILLNESS:  This is a 81 year old woman past medical history of hypertension, hyperlipidemia, diabetes mellitus, CKD, previous stroke who is presenting after syncopal episode.  Daughter reports on August 25 patient was not feeling well, she was complaining of a lot of back pain, she had slurred speech around noon, daughter reported that due to her back pain she was not drinking a lot of water.  In the evening when she was getting her dinner, daughter noted that patient did not look right, she was wobbly she was shaking on the left arm that she passed out on the chair.  She was out for about 2-minutes, no abnormal movement noted, no tongue biting, no urinary incontinence.  When EMS arrived, patient was awake and answering appropriately.  There was no postictal confusion.  She was taken to the hospital, initial work-up including MRI and EEG was with no acute abnormality.  She was found to have AKI on top of her CKD.  Syncope was thought to be related to dehydration.  Since discharge from the hospital she has been increasing her fluid intake, drinks about 32 ounces of water a day.  Denies any previous history of syncope and since hospitalization has not had any syncopal episode.   Hospital course  [name] [name] is a 81 y.o. female with medical history significant of chronic diastolic CHF, X4JO, stage IV CKD, hypertension, hyperlipidemia who presents to the emergency department due to reported syncopal episode at home this evening.  Patient complained of left-sided upper back pain that has been ongoing for about  a week, this worsened within last 3 days.  Daughter checked on patient today's evening and noted that patient was having slurred speech around 6:30 PM, she also presented with bilateral arm tremors, blood glucose was checked and it was normal, this was subsequently followed by patient becoming unconscious for about 2 minutes with immediate recovery and no postictal confusion.  CT head without contrast showed no acute intracranial hemorrhage or infarct Thoracic spine x-ray showed moderate degenerative disc disease.  No acute fracture or listhesis Chest x-ray showed pulmonary hyperinflation Hospitalist was asked to admit patient for further evaluation and management. MRI brain with out contrast showed No acute intracranial abnormality. Chronic posterior right cerebral infarct. Mild chronic small vessel ischemic disease. US carotid duplex is negative for significant stenosis.    OTHER MEDICAL CONDITIONS: Hypertension, hyperlipidemia, Diabetes, CKD, Previous stroke,    REVIEW OF SYSTEMS: Full 14 system review of systems performed and negative with exception of: As noted in the HPI   ALLERGIES: Allergies  Allergen Reactions   Brimonidine Other (See Comments)    Eyes swell and itch   Dorzolamide     Other reaction(s): Other (See Comments) Red, inflamed eyelid, dermatitis    Timolol Other (See Comments)    Caused eyes to swell and itch    HOME MEDICATIONS: Outpatient Medications Prior to Visit  Medication Sig Dispense Refill   ACCU-CHEK AVIVA PLUS test strip TEST THREE TIMES DAILY AS DIRECTED 100 strip 11   amLODipine (NORVASC) 5 MG tablet Take 1 tablet (5 mg total) by mouth daily.  90 tablet 3   aspirin EC 81 MG tablet Take 1 tablet (81 mg total) by mouth daily. (Patient taking differently: Take 325 mg by mouth daily.) 30 tablet    atorvastatin (LIPITOR) 20 MG tablet Take 1 tablet (20 mg total) by mouth daily. 90 tablet 2   azelastine (ASTELIN) 0.1 % nasal spray Place 1 spray into both  nostrils 2 (two) times daily. Use in each nostril as directed 30 mL 12   B-D UF III MINI PEN NEEDLES 31G X 5 MM MISC Use as directed 4x a day 300 each 3   blood glucose meter kit and supplies Dispense based on patient and insurance preference. Use up to four times daily as directed.dx code e11.65 1 each 0   Blood Glucose Monitoring Suppl (TRUE METRIX METER) DEVI Use to check blood sugars 3 times a day. 1 each 3   Cholecalciferol (VITAMIN D3) 50 MCG (2000 UT) TABS Take 1 tablet by mouth daily at 12 noon.     Continuous Blood Gluc Receiver (DEXCOM G6 RECEIVER) DEVI Use to check blood sugar 3 times a day before meals. Dx code e11.65 3 each 9   Continuous Blood Gluc Sensor (DEXCOM G6 SENSOR) MISC Use to check blood sugar 3 times a day before meals. Dx code e11.65 3 each 9   Ferrous Sulfate (IRON PO) Take 65 mg by mouth daily.     Glucagon (GVOKE HYPOPEN 2-PACK) 0.5 MG/0.1ML SOAJ Inject 0.5 mg into the skin daily as needed. 0.1 mL 3   glucose blood (TRUE METRIX BLOOD GLUCOSE TEST) test strip Use as instructed to check blood sugar 3 times a day. Dx code e11.65 100 each 12   hydrALAZINE (APRESOLINE) 50 MG tablet Take 1 tablet (50 mg total) by mouth 3 (three) times daily. 270 tablet 1   hydrOXYzine (ATARAX) 25 MG tablet Take 1 tablet (25 mg total) by mouth 3 (three) times daily as needed for itching. 30 tablet 0   insulin degludec (TRESIBA FLEXTOUCH) 200 UNIT/ML FlexTouch Pen Inject 20 Units into the skin daily. 9 mL 1   insulin lispro (HUMALOG KWIKPEN) 200 UNIT/ML KwikPen inject 8-10 units into THE SKIN 15 minutes BEFORE THREE main meals of THE DAY (Patient taking differently: See admin instructions. inject 8-10 units into THE SKIN 15 minutes BEFORE THREE main meals of THE DAY) 9 mL 1   Lancets (ACCU-CHEK SOFT TOUCH) lancets Check blood sugars 3 times daily E11.65 100 each 12   latanoprost (XALATAN) 0.005 % ophthalmic solution Place 1 drop into both eyes at bedtime.      metoprolol succinate (TOPROL-XL) 25  MG 24 hr tablet Take 0.5 tablets (12.5 mg total) by mouth daily. 45 tablet 3   Multiple Vitamin (MULTIVITAMIN) tablet Take 1 tablet by mouth daily.     Semaglutide, 1 MG/DOSE, (OZEMPIC, 1 MG/DOSE,) 2 MG/1.5ML SOPN Inject 1 mg into the skin once a week. 4.5 mL 1   No facility-administered medications prior to visit.    PAST MEDICAL HISTORY: Past Medical History:  Diagnosis Date   Diabetes mellitus ([place])    H/O: CVA (cerebrovascular accident) 2002   HTN (hypertension)    Hyperlipidemia    Pulmonary HTN ([place])    mild by echo    PAST SURGICAL HISTORY: Past Surgical History:  Procedure Laterality Date   ABDOMINAL HYSTERECTOMY     CARDIAC CATHETERIZATION  03/17/2003   Normal, patent coronary arteries. Normal LV systolic function   CARDIOVASCULAR STRESS TEST  08/28/2006   Small sized, moderate  ischemiain the Basal Inferoseptal, Basal Inferior, Mid Inferior and Apical Inferior regions   TRANSTHORACIC ECHOCARDIOGRAM  02/17/2010   EF >55%, moderate-severely thickened septum, mild-moderate mitral annular calcification    FAMILY HISTORY: Family History  Problem Relation Age of Onset   Hypertension Mother    Hypertension Father    Heart Problems Father    Hypertension Sister    Diabetes Sister    Hypertension Brother    Diabetes Brother    Breast cancer Neg Hx     SOCIAL HISTORY: Social History   Socioeconomic History   Marital status: Married    Spouse name: Not on file   Number of children: Not on file   Years of education: Not on file   Highest education level: Not on file  Occupational History   Occupation: retired  Tobacco Use   Smoking status: Never   Smokeless tobacco: Never  Vaping Use   Vaping Use: Never used  Substance and Sexual Activity   Alcohol use: No   Drug use: Never   Sexual activity: Not Currently  Other Topics Concern   Not on file  Social History Narrative   Not on file   Social Determinants of Health   Financial Resource Strain: Low Risk   (10/19/2021)   Overall Financial Resource Strain (CARDIA)    Difficulty of Paying Living Expenses: Not hard at all  Food Insecurity: No Food Insecurity (10/19/2021)   Hunger Vital Sign    Worried About Running Out of Food in the Last Year: Never true    Ran Out of Food in the Last Year: Never true  Transportation Needs: No Transportation Needs (10/19/2021)   PRAPARE - Hydrologist (Medical): No    Lack of Transportation (Non-Medical): No  Physical Activity: Inactive (10/19/2021)   Exercise Vital Sign    Days of Exercise per Week: 0 days    Minutes of Exercise per Session: 0 min  Stress: No Stress Concern Present (10/19/2021)   [place]    Feeling of Stress : Not at all  Social Connections: Not on file  Intimate Partner Violence: Not At Risk (08/13/2018)   Humiliation, Afraid, Rape, and Kick questionnaire    Fear of Current or Ex-Partner: No    Emotionally Abused: No    Physically Abused: No    Sexually Abused: No    PHYSICAL EXAM  GENERAL EXAM/CONSTITUTIONAL: Vitals:  Vitals:   01/17/22 1540  BP: (!) 178/53  Pulse: 81  Weight: 166 lb (75.3 kg)  Height: 5' 2"  (1.575 m)   Body mass index is 30.36 kg/m. Wt Readings from Last 3 Encounters:  01/17/22 166 lb (75.3 kg)  12/22/21 165 lb 9.6 oz (75.1 kg)  12/07/21 153 lb 12.8 oz (69.8 kg)   Patient is in no distress; well developed, nourished and groomed; neck is supple  EYES: Pupils round and reactive to light, Visual fields full to confrontation, Extraocular movements intacts,   MUSCULOSKELETAL: Gait, strength, tone, movements noted in Neurologic exam below  NEUROLOGIC: MENTAL STATUS:      No data to display         awake, alert, oriented to person, place and time recent and remote memory intact normal attention and concentration, able to calculate the number of quarters in $1,75 but has difficulty spelling pencil  backward language fluent, comprehension intact, naming intact fund of knowledge appropriate  CRANIAL NERVE:  2nd, 3rd, 4th, 6th - pupils equal and  reactive to light, visual fields full to confrontation, extraocular muscles intact, no nystagmus 5th - facial sensation symmetric 7th - facial strength symmetric 8th - hearing intact 9th - palate elevates symmetrically, uvula midline 11th - shoulder shrug symmetric 12th - tongue protrusion midline  MOTOR:  normal bulk and tone, full strength in the BUE, BLE  SENSORY:  normal and symmetric to light touch  COORDINATION:  finger-nose-finger, fine finger movements normal  REFLEXES:  deep tendon reflexes present and symmetric  GAIT/STATION:  normal   DIAGNOSTIC DATA (LABS, IMAGING, TESTING) - I reviewed patient records, labs, notes, testing and imaging myself where available.  Lab Results  Component Value Date   WBC 10.0 12/03/2021   HGB 9.6 (L) 12/03/2021   HCT 28.8 (L) 12/03/2021   MCV 90.0 12/03/2021   PLT 342 12/03/2021      Component Value Date/Time   NA 144 12/07/2021 1606   K 4.3 12/07/2021 1606   CL 105 12/07/2021 1606   CO2 20 12/07/2021 1606   GLUCOSE 202 (H) 12/07/2021 1606   GLUCOSE 276 (H) 12/03/2021 0559   BUN 62 (H) 12/07/2021 1606   CREATININE 3.71 (H) 12/07/2021 1606   CREATININE 1.95 (H) 10/09/2018 0905   CALCIUM 9.5 12/07/2021 1606   PROT 5.7 (L) 12/02/2021 0342   PROT 6.3 08/30/2021 1153   ALBUMIN 2.6 (L) 12/02/2021 0342   ALBUMIN 3.6 08/30/2021 1153   AST 14 (L) 12/02/2021 0342   ALT 11 12/02/2021 0342   ALKPHOS 65 12/02/2021 0342   BILITOT 0.5 12/02/2021 0342   BILITOT 0.3 08/30/2021 1153   GFRNONAA 11 (L) 12/03/2021 0559   GFRNONAA 24 (L) 10/09/2018 0905   GFRAA 20 (L) 03/15/2020 1149   GFRAA 27 (L) 10/09/2018 0905   Lab Results  Component Value Date   CHOL 166 08/30/2021   HDL 40 08/30/2021   LDLCALC 101 (H) 08/30/2021   TRIG 143 08/30/2021   CHOLHDL 4.2 08/30/2021   Lab Results   Component Value Date   HGBA1C 7.2 (H) 11/29/2021   Lab Results  Component Value Date   VITAMINB12 1,310 (H) 03/15/2020   Lab Results  Component Value Date   TSH 1.520 02/17/2021    MRI Brain 12/02/21 1. No acute intracranial abnormality. 2. Chronic posterior right cerebral infarct. 3. Mild chronic small vessel ischemic disease.  Routine EEG 12/02/21  Normal   ASSESSMENT AND PLAN  81 y.o. year old female with history of hypertension, hyperlipidemia, previous stroke, diabetes mellitus, who is presenting after being admitted to the hospital after syncope.  Initial MRI did not show any acute intracranial abnormality and her EEG was also normal.  Patient was found to have an AKI.  Per daughter she was not drinking enough fluid.  Patient syncope likely vasovagal, at this point will recommend continue current medication, continue to follow with PCP and return as needed.   1. Syncope, unspecified syncope type      Patient Instructions  Continue current medications  Follow up with PCP  Return as needed   No orders of the defined types were placed in this encounter.   No orders of the defined types were placed in this encounter.   Return if symptoms worsen or fail to improve.  I have spent a total of 45 minutes dedicated to this patient today, preparing to see patient, performing a medically appropriate examination and evaluation, ordering tests and/or medications and procedures, and counseling and educating the patient/family/caregiver; independently interpreting result and communicating results to the family/patient/caregiver;  and documenting clinical information in the electronic medical record.   Alric Ran, MD 01/17/2022, 4:13 PM  [facility] [address] [place], [place] [address] [phone]

## 2022-01-17 NOTE — Patient Instructions (Signed)
Continue current medications Follow-up with PCP Return as needed. 

## 2022-01-18 NOTE — Telephone Encounter (Signed)
Spoke with pt daughter, she reports the patient takes a furosemide on days when she has swelling nd tht helps with any swelling she has. The patient is not having SOB. Advised if she has to take furosemide for several days in the row to let us know.

## 2022-02-02 ENCOUNTER — Other Ambulatory Visit: Payer: Self-pay | Admitting: Internal Medicine

## 2022-02-02 ENCOUNTER — Other Ambulatory Visit: Payer: Medicare Other

## 2022-02-02 DIAGNOSIS — E1165 Type 2 diabetes mellitus with hyperglycemia: Secondary | ICD-10-CM

## 2022-02-03 DIAGNOSIS — Z794 Long term (current) use of insulin: Secondary | ICD-10-CM | POA: Diagnosis not present

## 2022-02-03 DIAGNOSIS — E119 Type 2 diabetes mellitus without complications: Secondary | ICD-10-CM | POA: Diagnosis not present

## 2022-02-10 ENCOUNTER — Other Ambulatory Visit: Payer: Self-pay | Admitting: Internal Medicine

## 2022-02-10 DIAGNOSIS — E1165 Type 2 diabetes mellitus with hyperglycemia: Secondary | ICD-10-CM

## 2022-02-12 ENCOUNTER — Other Ambulatory Visit: Payer: Self-pay | Admitting: Internal Medicine

## 2022-02-26 ENCOUNTER — Encounter: Payer: Self-pay | Admitting: Internal Medicine

## 2022-03-06 ENCOUNTER — Encounter: Payer: Self-pay | Admitting: Cardiovascular Disease

## 2022-03-07 MED ORDER — FUROSEMIDE 20 MG PO TABS: 20.0000 mg | ORAL_TABLET | ORAL | 5 refills | Status: DC | PRN

## 2022-03-13 ENCOUNTER — Encounter: Payer: Self-pay | Admitting: Internal Medicine

## 2022-03-14 ENCOUNTER — Telehealth: Payer: Self-pay

## 2022-03-14 NOTE — Chronic Care Management (AMB) (Signed)
Chronic Care Management Pharmacy Assistant   Name: [name] [name]  MRN: [id number] DOB: 17-Jun-1940   Reason for Encounter: Medication Review/ Medication coordination  Recent office visits:  12-26-2021 Glendale Chard, MD. Home care visit.   Recent consult visits:  01-17-2022 Alric Ran, MD (Neurology). Follow up visit.  01-12-2022 [name], MD (Ophthalmology). Optical Coherence Tomography procedure.  12-22-2021 Lenna Sciara, NP (Cardiology). Cardiac event monitor orders placed.  12-19-2021 William Hamburger, MD (Endo). Decrease tresiba to 16 units nightly.  Hospital visits:  Medication Reconciliation was completed by comparing discharge summary, patient's EMR and Pharmacy list, and upon discussion with patient.   Admitted to the hospital on 12-01-2021 due to Syncope and collapse. Discharge date was 12-03-2021. Discharged from [place]?Medications Started at [facility] Discharge:?? None   Medication Changes at Hospital Discharge: None   Medications Discontinued at Hospital Discharge: Lasix Flonase  Cetirizine   Medications that remain the same after Hospital Discharge:??  -All other medications will remain the same.    Hospital visits:  Medication Reconciliation was completed by comparing discharge summary, patient's EMR and Pharmacy list, and upon discussion with patient.   Admitted to the hospital on 11-24-2021 due to leg swelling.  Discharge date was 11-24-2021. Discharged from [place]?Medications Started at [facility] Discharge:?? None   Medication Changes at Hospital Discharge: None   Medications Discontinued at Hospital Discharge: None   Medications that remain the same after Hospital Discharge:??  -All other medications will remain the same.   Medications: Outpatient Encounter Medications as of 03/14/2022  Medication Sig Note   ACCU-CHEK AVIVA PLUS test strip TEST THREE TIMES DAILY AS  DIRECTED    amLODipine (NORVASC) 5 MG tablet Take 1 tablet (5 mg total) by mouth daily.    aspirin EC 81 MG tablet Take 1 tablet (81 mg total) by mouth daily. (Patient taking differently: Take 325 mg by mouth daily.)    atorvastatin (LIPITOR) 20 MG tablet Take 1 tablet (20 mg total) by mouth daily.    azelastine (ASTELIN) 0.1 % nasal spray Place 1 spray into both nostrils 2 (two) times daily. Use in each nostril as directed    B-D UF III MINI PEN NEEDLES 31G X 5 MM MISC Use as directed 4x a day    blood glucose meter kit and supplies Dispense based on patient and insurance preference. Use up to four times daily as directed.dx code e11.65    Blood Glucose Monitoring Suppl (TRUE METRIX METER) DEVI Use to check blood sugars 3 times a day.    Cholecalciferol (VITAMIN D3) 50 MCG (2000 UT) TABS Take 1 tablet by mouth daily at 12 noon.    Continuous Blood Gluc Receiver (DEXCOM G6 RECEIVER) DEVI Use to check blood sugar 3 times a day before meals. Dx code e11.65    Continuous Blood Gluc Sensor (DEXCOM G6 SENSOR) MISC Use to check blood sugar 3 times a day before meals. Dx code e11.65    Ferrous Sulfate (IRON PO) Take 65 mg by mouth daily.    furosemide (LASIX) 20 MG tablet Take 1 tablet (20 mg total) by mouth as needed (for swelling once daily).    Glucagon (GVOKE HYPOPEN 2-PACK) 0.5 MG/0.1ML SOAJ Inject 0.5 mg into the skin daily as needed.    glucose blood (TRUE METRIX BLOOD GLUCOSE TEST) test strip Use as instructed to check blood sugar 3 times a day. Dx code e11.65  hydrALAZINE (APRESOLINE) 50 MG tablet Take 1 tablet (50 mg total) by mouth 3 (three) times daily.    hydrOXYzine (ATARAX) 25 MG tablet Take 1 tablet (25 mg total) by mouth 3 (three) times daily as needed for itching.    insulin degludec (TRESIBA FLEXTOUCH) 200 UNIT/ML FlexTouch Pen Inject 20 Units into the skin daily.    insulin lispro (HUMALOG KWIKPEN) 200 UNIT/ML KwikPen inject 8-10 units into THE SKIN 15 minutes BEFORE THREE main meals  of THE DAY    Lancets (ACCU-CHEK SOFT TOUCH) lancets Check blood sugars 3 times daily E11.65    latanoprost (XALATAN) 0.005 % ophthalmic solution Place 1 drop into both eyes at bedtime.     metoprolol succinate (TOPROL-XL) 25 MG 24 hr tablet Take 0.5 tablets (12.5 mg total) by mouth daily.    Multiple Vitamin (MULTIVITAMIN) tablet Take 1 tablet by mouth daily.    Semaglutide, 1 MG/DOSE, (OZEMPIC, 1 MG/DOSE,) 2 MG/1.5ML SOPN Inject 1 mg into the skin once a week. 09/02/2021: Fridays   No facility-administered encounter medications on file as of 03/14/2022.  Reviewed chart for medication changes ahead of medication coordination call.  No medication changes indicated   BP Readings from Last 3 Encounters:  01/17/22 (!) 178/53  12/22/21 138/62  12/07/21 (!) 162/70    Lab Results  Component Value Date   HGBA1C 7.2 (H) 11/29/2021     Patient obtains medications through Vials  90 Days   Last adherence delivery included:  Atorvastatin 20 mg at breakfast Tresiba 20 units daily Hydroxyzine 25 mg 1-2 tabs at bedtime PRN (Vials)  Patient declined (meds) last delivery: Amlodipine 10 mg- [facility] supply due to decrease to 5 mg. Daughter is cutting pills in half Metoprolol 25 mg- Plenty supply due to decrease to 12.5 mg. Daughter is cutting pills in half Ozempic 1 mg weekly- Plenty supply Aspirin 81 mg- Plenty supply Rosuvastatin- D/C changed to atorvastatin Cetirizine- Plenty supply   Patient is due for next adherence delivery on: 03-24-2022  Called patient and reviewed medications and coordinated delivery.  This delivery to include: Atorvastatin 20 mg daily Hydralazine 50 mg 3 times daily Metoprolol 25 mg 1/2 tablet daily Humalog 8-10 units 15 mins before meals Amlodipine 5 mg daily Tresiba 20 units daily Lasix 20 mg daily as needed Hydroxyzine 25 mg 1-2 tabs at bedtime PRN Aspirin 81 mg daily- Add Cetirizine 10 mg daily- Add Ozempic 1 mg weekly- Add  No short/acute fill  needed  Patient declined the following medications: None  Patient needs refills for: None  Confirmed delivery date of 03-24-2022 advised patient that pharmacy will contact them the morning of delivery.  Care Gaps: Ophthalmology exam overdue Flu vaccine overdue Covid booster overdue Foot exam overdue  Star Rating Drugs: Ozempic 1 mg- Last filled 08-30-2021 [facility] Atorvastatin 20 mg- Last filled 12-21-2021 [facility]    [place] Clinical Pharmacist Assistant [phone]

## 2022-03-20 ENCOUNTER — Other Ambulatory Visit: Payer: Self-pay | Admitting: Internal Medicine

## 2022-03-20 ENCOUNTER — Other Ambulatory Visit (HOSPITAL_BASED_OUTPATIENT_CLINIC_OR_DEPARTMENT_OTHER): Payer: Self-pay | Admitting: Internal Medicine

## 2022-03-20 DIAGNOSIS — E1165 Type 2 diabetes mellitus with hyperglycemia: Secondary | ICD-10-CM

## 2022-03-21 NOTE — Progress Notes (Signed)
I,Victoria T Hamilton,acting as a scribe for Maximino Greenland, MD.,have documented all relevant documentation on the behalf of Maximino Greenland, MD,as directed by  Maximino Greenland, MD while in the presence of Maximino Greenland, MD.   Subjective:     Patient ID: [name] [name] , female    DOB: 06/14/1940 , 81 y.o.   MRN: [id number]   Chief Complaint  Patient presents with   Annual Exam   Diabetes   Hypertension    HPI  She presents today for HM. She is accompanied by her daughter today.  She reports compliance with meds. States her sugars have improved.    Patient completed EKG on 12/01/2021.  She states her diabetic eye exam is up to date. Dr. Kelby Aline completed eye exam.  Diabetes She presents for her follow-up diabetic visit. She has type 2 diabetes mellitus. Pertinent negatives for diabetes include no blurred vision and no chest pain. Risk factors for coronary artery disease include diabetes mellitus, dyslipidemia, hypertension, post-menopausal and sedentary lifestyle. Current diabetic treatment includes insulin injections and oral agent (monotherapy). She participates in exercise intermittently. An ACE inhibitor/angiotensin II receptor blocker is being taken. Eye exam is current.  Hypertension This is a chronic problem. The current episode started more than 1 year ago. The problem has been gradually improving since onset. The problem is controlled. Pertinent negatives include no blurred vision, chest pain, palpitations or shortness of breath. Risk factors for coronary artery disease include diabetes mellitus, dyslipidemia, post-menopausal state and sedentary lifestyle. The current treatment provides moderate improvement. Compliance problems include exercise.  Hypertensive end-organ damage includes kidney disease.     Past Medical History:  Diagnosis Date   Diabetes mellitus ([place])    H/O: CVA (cerebrovascular accident) 2002   HTN (hypertension)    Hyperlipidemia    Pulmonary HTN ([facility])     mild by echo     Family History  Problem Relation Age of Onset   Hypertension Mother    Hypertension Father    Heart Problems Father    Hypertension Sister    Diabetes Sister    Hypertension Brother    Diabetes Brother    Breast cancer Neg Hx      Current Outpatient Medications:    ACCU-CHEK AVIVA PLUS test strip, TEST THREE TIMES DAILY AS DIRECTED, Disp: 100 strip, Rfl: 11   amLODipine (NORVASC) 5 MG tablet, Take 1 tablet (5 mg total) by mouth daily., Disp: 90 tablet, Rfl: 3   aspirin EC 81 MG tablet, Take 1 tablet (81 mg total) by mouth daily. (Patient taking differently: Take 325 mg by mouth daily.), Disp: 30 tablet, Rfl:    azelastine (ASTELIN) 0.1 % nasal spray, Place 1 spray into both nostrils 2 (two) times daily. Use in each nostril as directed, Disp: 30 mL, Rfl: 12   B-D UF III MINI PEN NEEDLES 31G X 5 MM MISC, Use as directed 4x a day, Disp: 300 each, Rfl: 3   blood glucose meter kit and supplies, Dispense based on patient and insurance preference. Use up to four times daily as directed.dx code e11.65, Disp: 1 each, Rfl: 0   Blood Glucose Monitoring Suppl (TRUE METRIX METER) DEVI, Use to check blood sugars 3 times a day., Disp: 1 each, Rfl: 3   Cholecalciferol (VITAMIN D3) 50 MCG (2000 UT) TABS, Take 1 tablet by mouth daily at 12 noon., Disp: , Rfl:    Ferrous Sulfate (IRON PO), Take 65 mg by mouth daily., Disp: , Rfl:  furosemide (LASIX) 20 MG tablet, Take 1 tablet (20 mg total) by mouth as needed (for swelling once daily)., Disp: 30 tablet, Rfl: 5   Glucagon (GVOKE HYPOPEN 2-PACK) 0.5 MG/0.1ML SOAJ, Inject 0.5 mg into the skin daily as needed., Disp: 0.1 mL, Rfl: 3   glucose blood (TRUE METRIX BLOOD GLUCOSE TEST) test strip, Use as instructed to check blood sugar 3 times a day. Dx code e11.65, Disp: 100 each, Rfl: 12   hydrALAZINE (APRESOLINE) 50 MG tablet, Take 1 tablet (50 mg total) by mouth 3 (three) times daily., Disp: 270 tablet, Rfl: 1   hydrOXYzine (ATARAX) 25 MG  tablet, Take 1 tablet (25 mg total) by mouth 3 (three) times daily as needed for itching., Disp: 30 tablet, Rfl: 0   insulin lispro (HUMALOG KWIKPEN) 200 UNIT/ML KwikPen, inject 8-10 units into THE SKIN 15 minutes BEFORE THREE main meals of THE DAY, Disp: 9 mL, Rfl: 0   Lancets (ACCU-CHEK SOFT TOUCH) lancets, Check blood sugars 3 times daily E11.65, Disp: 100 each, Rfl: 12   latanoprost (XALATAN) 0.005 % ophthalmic solution, Place 1 drop into both eyes at bedtime. , Disp: , Rfl:    metoprolol succinate (TOPROL-XL) 25 MG 24 hr tablet, Take 0.5 tablets (12.5 mg total) by mouth daily., Disp: 45 tablet, Rfl: 3   Multiple Vitamin (MULTIVITAMIN) tablet, Take 1 tablet by mouth daily., Disp: , Rfl:    Semaglutide, 1 MG/DOSE, (OZEMPIC, 1 MG/DOSE,) 2 MG/1.5ML SOPN, Inject 1 mg into the skin once a week., Disp: 4.5 mL, Rfl: 1   TRESIBA FLEXTOUCH 200 UNIT/ML FlexTouch Pen, inject 20 units into THE SKIN daily, Disp: 9 mL, Rfl: 1   atorvastatin (LIPITOR) 20 MG tablet, Take 1 tablet (20 mg total) by mouth daily., Disp: 90 tablet, Rfl: 2   Allergies  Allergen Reactions   Brimonidine Other (See Comments)    Eyes swell and itch   Dorzolamide     Other reaction(s): Other (See Comments) Red, inflamed eyelid, dermatitis    Timolol Other (See Comments)    Caused eyes to swell and itch      The patient states she uses post menopausal status for birth control. Last LMP was No LMP recorded. Patient has had a hysterectomy.. Negative for Dysmenorrhea Negative for: breast discharge, breast lump(s), breast pain and breast self exam. Associated symptoms include abnormal vaginal bleeding. Pertinent negatives include abnormal bleeding (hematology), anxiety, decreased libido, depression, difficulty falling sleep, dyspareunia, history of infertility, nocturia, sexual dysfunction, sleep disturbances, urinary incontinence, urinary urgency, vaginal discharge and vaginal itching. Diet regular.    . The patient's tobacco use is:   Social History   Tobacco Use  Smoking Status Never  Smokeless Tobacco Never  . She has been exposed to passive smoke. The patient's alcohol use is:  Social History   Substance and Sexual Activity  Alcohol Use No   Review of Systems  Constitutional: Negative.   HENT: Negative.    Eyes: Negative.  Negative for blurred vision.  Respiratory: Negative.  Negative for shortness of breath.   Cardiovascular: Negative.  Negative for chest pain and palpitations.  Gastrointestinal: Negative.   Endocrine: Negative.   Genitourinary: Negative.   Musculoskeletal: Negative.   Skin: Negative.   Allergic/Immunologic: Negative.   Neurological: Negative.   Hematological: Negative.   Psychiatric/Behavioral: Negative.       Today's Vitals   03/22/22 0845  BP: 108/72  Pulse: 81  Temp: 97.6 F (36.4 C)  SpO2: 98%  Weight: 150 lb 6.4 oz (68.2  kg)  Height: _0  (1.575 m)   Body mass index is 27.51 kg/m.  Wt Readings from Last 3 Encounters:  03/29/22 160 lb (72.6 kg)  03/22/22 150 lb 6.4 oz (68.2 kg)  01/17/22 166 lb (75.3 kg)    Objective:  Physical Exam Vitals and nursing note reviewed.  Constitutional:      Appearance: Normal appearance.     Comments: She is seated in w/c for exam.   HENT:     Head: Normocephalic and atraumatic.     Comments: Ptosis left eyelid    Right Ear: Tympanic membrane, ear canal and external ear normal.     Left Ear: Tympanic membrane, ear canal and external ear normal.     Nose:     Comments: Masked     Mouth/Throat:     Comments: Masked  Eyes:     Extraocular Movements: Extraocular movements intact.     Conjunctiva/sclera: Conjunctivae normal.     Pupils: Pupils are equal, round, and reactive to light.  Cardiovascular:     Rate and Rhythm: Normal rate and regular rhythm.     Pulses: Normal pulses.          Dorsalis pedis pulses are 2+ on the right side and 2+ on the left side.     Heart sounds: Normal heart sounds.  Pulmonary:     Effort:  Pulmonary effort is normal.     Breath sounds: Normal breath sounds.  Chest:  Breasts:    Tanner Score is 5.     Right: Normal.     Left: Normal.  Abdominal:     General: Bowel sounds are normal.     Palpations: Abdomen is soft.  Genitourinary:    Comments: deferred Musculoskeletal:        General: Tenderness present. Normal range of motion.     Cervical back: Normal range of motion and neck supple.  Feet:     Right foot:     Protective Sensation: 5 sites tested.  5 sites sensed.     Skin integrity: Dry skin present.     Toenail Condition: Right toenails are normal.     Left foot:     Protective Sensation: 5 sites tested.  5 sites sensed.     Skin integrity: Dry skin present.     Toenail Condition: Left toenails are normal.  Skin:    General: Skin is warm and dry.  Neurological:     General: No focal deficit present.     Mental Status: She is alert and oriented to person, place, and time.  Psychiatric:        Mood and Affect: Mood normal.        Behavior: Behavior normal.       Assessment And Plan:    1. Encounter for annual physical exam Comments: A full exam was performed. Importance of monthly self breast exams was discussed with the patient. Pelvic exam deferred.  PATIENT IS ADVISED TO GET 30-45 MINUTES REGULAR EXERCISE NO LESS THAN FOUR TO FIVE DAYS PER WEEK - BOTH WEIGHTBEARING EXERCISES AND AEROBIC ARE RECOMMENDED.  PATIENT IS ADVISED TO FOLLOW A HEALTHY DIET WITH AT LEAST SIX FRUITS/VEGGIES PER DAY, DECREASE INTAKE OF RED MEAT, AND TO INCREASE FISH INTAKE TO TWO DAYS PER WEEK.  MEATS/FISH SHOULD NOT BE FRIED, BAKED OR BROILED IS PREFERABLE.  IT IS ALSO IMPORTANT TO CUT BACK ON YOUR SUGAR INTAKE. PLEASE AVOID ANYTHING WITH ADDED SUGAR, CORN SYRUP OR OTHER SWEETENERS. IF YOU MUST  USE A SWEETENER, YOU CAN TRY STEVIA. IT IS ALSO IMPORTANT TO AVOID ARTIFICIALLY SWEETENERS AND DIET BEVERAGES. LASTLY, I SUGGEST WEARING SPF 50 SUNSCREEN ON EXPOSED PARTS AND ESPECIALLY WHEN IN THE  DIRECT SUNLIGHT FOR AN EXTENDED PERIOD OF TIME.  PLEASE AVOID FAST FOOD RESTAURANTS AND INCREASE YOUR WATER INTAKE.  2. Poorly controlled type 2 diabetes mellitus with circulatory disorder ([place]) Comments: Diabetic foot exam was performed. Chronic, she is now followed by [facility].  She has not had recent visit, I will check labs as below.  I DISCUSSED WITH THE PATIENT AT LENGTH REGARDING THE GOALS OF GLYCEMIC CONTROL AND POSSIBLE LONG-TERM COMPLICATIONS.  I  ALSO STRESSED THE IMPORTANCE OF COMPLIANCE WITH HOME GLUCOSE MONITORING, DIETARY RESTRICTIONS INCLUDING AVOIDANCE OF SUGARY DRINKS/PROCESSED FOODS,  ALONG WITH REGULAR EXERCISE.  I  ALSO STRESSED THE IMPORTANCE OF ANNUAL EYE EXAMS, SELF FOOT CARE AND COMPLIANCE WITH OFFICE VISITS.  - CMP14+EGFR - CBC - Hemoglobin A1c - Lipid panel - TSH  3. Hypertensive heart and renal disease with renal failure, stage 1 through stage 4 or unspecified chronic kidney disease, with heart failure ([place]) Comments: Chronic, well controlled. NO med changes today. She is encouraged to follow a low sodium diet. - POCT Urinalysis Dipstick (81002) - Microalbumin / creatinine urine ratio  4. Stage 3b chronic kidney disease ([place]) Comments: Chronic, she is reminded to avoid NSAIDs, stay well hydrated and keep BP/BS controlled to decrease risk of CKD progression.  5. Chronic diastolic CHF (congestive heart failure) ([place]) Comments: Chronic, again importance of salt restriction was again stressed to the patient.  6. Chronic bilateral low back pain without sciatica Comments: Chronic, she would likely beneift from PT as well. Due to CKD, topical pain agents are preferable to oral agents including NSAIDs.  7. Gait abnormality Comments: She wishes to resume PT/OT with [facility]. She agrees to wait until the new year to start this.  8. History of falling Comments: Please see #7.  9. Need for influenza vaccination  Patient was given opportunity to ask questions. Patient  verbalized understanding of the plan and was able to repeat key elements of the plan. All questions were answered to their satisfaction.   I, Maximino Greenland, MD, have reviewed all documentation for this visit. The documentation on 03/22/22 for the exam, diagnosis, procedures, and orders are all accurate and complete.  THE PATIENT IS ENCOURAGED TO PRACTICE SOCIAL DISTANCING DUE TO THE COVID-19 PANDEMIC.

## 2022-03-21 NOTE — Patient Instructions (Signed)

## 2022-03-22 ENCOUNTER — Ambulatory Visit (INDEPENDENT_AMBULATORY_CARE_PROVIDER_SITE_OTHER): Payer: Medicare Other | Admitting: Internal Medicine

## 2022-03-22 ENCOUNTER — Encounter: Payer: Self-pay | Admitting: Internal Medicine

## 2022-03-22 VITALS — BP 108/72 | HR 81 | Temp 97.6°F | Ht 62.0 in | Wt 150.4 lb

## 2022-03-22 DIAGNOSIS — I130 Hypertensive heart and chronic kidney disease with heart failure and stage 1 through stage 4 chronic kidney disease, or unspecified chronic kidney disease: Secondary | ICD-10-CM | POA: Diagnosis not present

## 2022-03-22 DIAGNOSIS — M545 Low back pain, unspecified: Secondary | ICD-10-CM

## 2022-03-22 DIAGNOSIS — Z0001 Encounter for general adult medical examination with abnormal findings: Secondary | ICD-10-CM | POA: Diagnosis not present

## 2022-03-22 DIAGNOSIS — N1832 Chronic kidney disease, stage 3b: Secondary | ICD-10-CM

## 2022-03-22 DIAGNOSIS — I5032 Chronic diastolic (congestive) heart failure: Secondary | ICD-10-CM

## 2022-03-22 DIAGNOSIS — E162 Hypoglycemia, unspecified: Secondary | ICD-10-CM

## 2022-03-22 DIAGNOSIS — G8929 Other chronic pain: Secondary | ICD-10-CM

## 2022-03-22 DIAGNOSIS — R269 Unspecified abnormalities of gait and mobility: Secondary | ICD-10-CM

## 2022-03-22 DIAGNOSIS — Z9181 History of falling: Secondary | ICD-10-CM

## 2022-03-22 DIAGNOSIS — E1165 Type 2 diabetes mellitus with hyperglycemia: Secondary | ICD-10-CM | POA: Diagnosis not present

## 2022-03-22 DIAGNOSIS — E1159 Type 2 diabetes mellitus with other circulatory complications: Secondary | ICD-10-CM

## 2022-03-22 DIAGNOSIS — Z0000 Encounter for general adult medical examination without abnormal findings: Secondary | ICD-10-CM

## 2022-03-22 DIAGNOSIS — E1122 Type 2 diabetes mellitus with diabetic chronic kidney disease: Secondary | ICD-10-CM

## 2022-03-22 DIAGNOSIS — Z23 Encounter for immunization: Secondary | ICD-10-CM

## 2022-03-22 DIAGNOSIS — N179 Acute kidney failure, unspecified: Secondary | ICD-10-CM

## 2022-03-22 LAB — POCT URINALYSIS DIPSTICK
Glucose, UA: NEGATIVE
Ketones, UA: NEGATIVE
Leukocytes, UA: NEGATIVE
Nitrite, UA: NEGATIVE
Protein, UA: POSITIVE — AB
Spec Grav, UA: 1.02 (ref 1.010–1.025)
Urobilinogen, UA: 0.2 E.U./dL
pH, UA: 7.5 (ref 5.0–8.0)

## 2022-03-23 LAB — CMP14+EGFR
ALT: 10 IU/L (ref 0–32)
AST: 17 IU/L (ref 0–40)
Albumin/Globulin Ratio: 1.2 (ref 1.2–2.2)
Albumin: 3.3 g/dL — ABNORMAL LOW (ref 3.7–4.7)
Alkaline Phosphatase: 113 IU/L (ref 44–121)
BUN/Creatinine Ratio: 7 — ABNORMAL LOW (ref 12–28)
BUN: 29 mg/dL — ABNORMAL HIGH (ref 8–27)
Bilirubin Total: 0.2 mg/dL (ref 0.0–1.2)
CO2: 19 mmol/L — ABNORMAL LOW (ref 20–29)
Calcium: 9.3 mg/dL (ref 8.7–10.3)
Chloride: 107 mmol/L — ABNORMAL HIGH (ref 96–106)
Creatinine, Ser: 4.21 mg/dL — ABNORMAL HIGH (ref 0.57–1.00)
Globulin, Total: 2.8 g/dL (ref 1.5–4.5)
Glucose: 101 mg/dL — ABNORMAL HIGH (ref 70–99)
Potassium: 3.2 mmol/L — ABNORMAL LOW (ref 3.5–5.2)
Sodium: 145 mmol/L — ABNORMAL HIGH (ref 134–144)
Total Protein: 6.1 g/dL (ref 6.0–8.5)
eGFR: 10 mL/min/{1.73_m2} — ABNORMAL LOW (ref >59)

## 2022-03-23 LAB — CBC
Hematocrit: 33.5 % — ABNORMAL LOW (ref 34.0–46.6)
Hemoglobin: 11.1 g/dL (ref 11.1–15.9)
MCH: 30.1 pg (ref 26.6–33.0)
MCHC: 33.1 g/dL (ref 31.5–35.7)
MCV: 91 fL (ref 79–97)
Platelets: 363 10*3/uL (ref 150–450)
RBC: 3.69 x10E6/uL — ABNORMAL LOW (ref 3.77–5.28)
RDW: 12.4 % (ref 11.7–15.4)
WBC: 10.4 10*3/uL (ref 3.4–10.8)

## 2022-03-23 LAB — LIPID PANEL
Chol/HDL Ratio: 6.3 ratio — ABNORMAL HIGH (ref 0.0–4.4)
Cholesterol, Total: 314 mg/dL — ABNORMAL HIGH (ref 100–199)
HDL: 50 mg/dL (ref >39)
LDL Chol Calc (NIH): 204 mg/dL — ABNORMAL HIGH (ref 0–99)
Triglycerides: 300 mg/dL — ABNORMAL HIGH (ref 0–149)
VLDL Cholesterol Cal: 60 mg/dL — ABNORMAL HIGH (ref 5–40)

## 2022-03-23 LAB — HEMOGLOBIN A1C
Est. average glucose Bld gHb Est-mCnc: 194 mg/dL
Hgb A1c MFr Bld: 8.4 % — ABNORMAL HIGH (ref 4.8–5.6)

## 2022-03-23 LAB — TSH: TSH: 2.72 u[IU]/mL (ref 0.450–4.500)

## 2022-03-23 LAB — MICROALBUMIN / CREATININE URINE RATIO
Creatinine, Urine: 97.8 mg/dL
Microalb/Creat Ratio: 13767 mg/g creat — ABNORMAL HIGH (ref 0–29)
Microalbumin, Urine: 13464 ug/mL

## 2022-03-24 ENCOUNTER — Encounter (HOSPITAL_COMMUNITY): Payer: Self-pay | Admitting: *Deleted

## 2022-03-24 ENCOUNTER — Other Ambulatory Visit: Payer: Self-pay

## 2022-03-24 ENCOUNTER — Emergency Department (HOSPITAL_COMMUNITY)
Admission: EM | Admit: 2022-03-24 | Discharge: 2022-03-24 | Disposition: A | Discharge status: Home / Self Care | Payer: Medicare Other | Attending: Emergency Medicine | Admitting: Emergency Medicine

## 2022-03-24 DIAGNOSIS — W010XXA Fall on same level from slipping, tripping and stumbling without subsequent striking against object, initial encounter: Secondary | ICD-10-CM | POA: Diagnosis not present

## 2022-03-24 DIAGNOSIS — W19XXXA Unspecified fall, initial encounter: Secondary | ICD-10-CM

## 2022-03-24 DIAGNOSIS — Y92481 Parking lot as the place of occurrence of the external cause: Secondary | ICD-10-CM | POA: Diagnosis not present

## 2022-03-24 DIAGNOSIS — Z7982 Long term (current) use of aspirin: Secondary | ICD-10-CM | POA: Insufficient documentation

## 2022-03-24 DIAGNOSIS — S60512A Abrasion of left hand, initial encounter: Secondary | ICD-10-CM | POA: Insufficient documentation

## 2022-03-24 DIAGNOSIS — S6992XA Unspecified injury of left wrist, hand and finger(s), initial encounter: Secondary | ICD-10-CM | POA: Diagnosis present

## 2022-03-24 DIAGNOSIS — Z794 Long term (current) use of insulin: Secondary | ICD-10-CM | POA: Insufficient documentation

## 2022-03-24 NOTE — ED Notes (Signed)
Pt ambulated in hallway with no difficulty. Little to no assistance needed. States she just wanted to get checked out and is ready to go home. States she believes fall was due to slip-on shoes.

## 2022-03-24 NOTE — ED Triage Notes (Signed)
Pt tripped in parking lot and fell today, denies hitting her head.  Superficial scratches noted to left hand.

## 2022-03-25 NOTE — ED Provider Notes (Signed)
[facility] EMERGENCY DEPARTMENT Provider Note   CSN: [id number] Arrival date & time: 03/24/22  1211     History  Chief Complaint  Patient presents with   [name] [name]    [name] [name] is a 81 y.o. female.  Patient reports falling and scratching her hand.  Patient reports she tripped over her own feet and fell.  Patient reports she did not strike her head patient denies any neck pain she denies any chest or abdominal pain she reports abrasions to her hand only.  Patient was advised to come to the emergency department to get evaluated.  Patient reports her tetanus shot is up-to-date.  She reports she is not currently having any pain.  The history is provided by the patient. No language interpreter was used.  Fall This is a new problem. The current episode started less than 1 hour ago. The problem occurs constantly. The problem has not changed since onset.Nothing aggravates the symptoms. Nothing relieves the symptoms. She has tried nothing for the symptoms. The treatment provided no relief.       Home Medications Prior to Admission medications   Medication Sig Start Date End Date Taking? Authorizing Provider  ACCU-CHEK AVIVA PLUS test strip TEST THREE TIMES DAILY AS DIRECTED 07/20/20   Glendale Chard, MD  amLODipine (NORVASC) 5 MG tablet Take 1 tablet (5 mg total) by mouth daily. 11/04/21   Troy Sine, MD  aspirin EC 81 MG tablet Take 1 tablet (81 mg total) by mouth daily. Patient taking differently: Take 325 mg by mouth daily. 09/16/21   Troy Sine, MD  atorvastatin (LIPITOR) 20 MG tablet Take 1 tablet (20 mg total) by mouth daily. 12/07/21 12/07/22  Glendale Chard, MD  azelastine (ASTELIN) 0.1 % nasal spray Place 1 spray into both nostrils 2 (two) times daily. Use in each nostril as directed 09/07/21   Glendale Chard, MD  B-D UF III MINI PEN NEEDLES 31G X 5 MM MISC Use as directed 4x a day 12/13/21   Glendale Chard, MD  blood glucose meter kit and supplies Dispense based on patient  and insurance preference. Use up to four times daily as directed.dx code e11.65 03/08/21   Glendale Chard, MD  Blood Glucose Monitoring Suppl (TRUE METRIX METER) DEVI Use to check blood sugars 3 times a day. 03/08/21   Glendale Chard, MD  Cholecalciferol (VITAMIN D3) 50 MCG (2000 UT) TABS Take 1 tablet by mouth daily at 12 noon.    [provider]  Ferrous Sulfate (IRON PO) Take 65 mg by mouth daily.    [provider]  furosemide (LASIX) 20 MG tablet Take 1 tablet (20 mg total) by mouth as needed (for swelling once daily). 03/07/22   Lenna Sciara, NP  Glucagon (GVOKE HYPOPEN 2-PACK) 0.5 MG/0.1ML SOAJ Inject 0.5 mg into the skin daily as needed. 08/02/21   Glendale Chard, MD  glucose blood (TRUE METRIX BLOOD GLUCOSE TEST) test strip Use as instructed to check blood sugar 3 times a day. Dx code e11.65 03/08/21   Glendale Chard, MD  hydrALAZINE (APRESOLINE) 50 MG tablet Take 1 tablet (50 mg total) by mouth 3 (three) times daily. 12/16/21   Glendale Chard, MD  hydrOXYzine (ATARAX) 25 MG tablet Take 1 tablet (25 mg total) by mouth 3 (three) times daily as needed for itching. 12/16/21   Glendale Chard, MD  insulin lispro (HUMALOG KWIKPEN) 200 UNIT/ML KwikPen inject 8-10 units into THE SKIN 15 minutes BEFORE THREE main meals of THE DAY 02/10/22  Philemon Kingdom, MD  Lancets (ACCU-CHEK SOFT [facility]) lancets Check blood sugars 3 times daily E11.65 02/12/18   Glendale Chard, MD  latanoprost (XALATAN) 0.005 % ophthalmic solution Place 1 drop into both eyes at bedtime.  03/17/19   [provider]  metoprolol succinate (TOPROL-XL) 25 MG 24 hr tablet Take 0.5 tablets (12.5 mg total) by mouth daily. 11/04/21   Troy Sine, MD  Multiple Vitamin (MULTIVITAMIN) tablet Take 1 tablet by mouth daily.    [provider]  Semaglutide, 1 MG/DOSE, (OZEMPIC, 1 MG/DOSE,) 2 MG/1.5ML SOPN Inject 1 mg into the skin once a week. 05/20/21   Philemon Kingdom, MD  TRESIBA FLEXTOUCH 200 UNIT/ML  FlexTouch Pen inject 20 units into THE SKIN daily 03/20/22   Glendale Chard, MD      Allergies    Brimonidine, Dorzolamide, and Timolol    Review of Systems   Review of Systems  Skin:  Positive for wound.  All other systems reviewed and are negative.   Physical Exam Updated Vital Signs BP (!) 149/90 (BP Location: Right Arm)   Pulse 78   Temp 97.7 F (36.5 C) (Oral)   Resp 16   SpO2 98%  Physical Exam Vitals reviewed.  Constitutional:      Appearance: Normal appearance.  HENT:     Head: Normocephalic.     Nose: Nose normal.     Mouth/Throat:     Mouth: Mucous membranes are moist.  Eyes:     Pupils: Pupils are equal, round, and reactive to light.  Cardiovascular:     Rate and Rhythm: Normal rate.  Pulmonary:     Effort: Pulmonary effort is normal.  Abdominal:     General: Abdomen is flat.  Musculoskeletal:        General: Normal range of motion.     Cervical back: Normal range of motion.  Skin:    Findings: Erythema present.     Comments: Abrasion left hand, no gaping  Neurological:     General: No focal deficit present.     Mental Status: She is alert.  Psychiatric:        Mood and Affect: Mood normal.     ED Results / Procedures / Treatments   Labs (all labs ordered are listed, but only abnormal results are displayed) Labs Reviewed - No data to display  EKG None  Radiology No results found.  Procedures Procedures    Medications Ordered in ED Medications - No data to display  ED Course/ Medical Decision Making/ A&P                           Medical Decision Making Reports that she stumbled over her own feet and fell patient reports she scratched her left hand  Risk Risk Details: Patient declined x-rays.  Patient does not feel like she has anything broken.           Final Clinical Impression(s) / ED Diagnoses Final diagnoses:  Fall, initial encounter  Abrasion of left hand, initial encounter    Rx / DC Orders ED Discharge  Orders     None      An After Visit Summary was printed and given to the patient.    Fransico Meadow, Hershal Coria 03/25/22 2140    Milton Ferguson, MD 04/06/22 [phone]

## 2022-03-27 ENCOUNTER — Telehealth: Payer: Self-pay

## 2022-03-27 NOTE — Telephone Encounter (Signed)
Transition Care Management Follow-up Telephone Call Date of discharge and from where: 03/24/22 [facility] How have you been since you were released from the hospital? Pt is doing okay. Any questions or concerns? Yes her daughter feels like she may have had a mini stroke. She is concerned about her being kinda sluggish. They wanted to bring her back to the ER but pt refuses to go at this time.  Items Reviewed: Did the pt receive and understand the discharge instructions provided? Yes  Medications obtained and verified? Yes  Other? No  Any new allergies since your discharge? No  Dietary orders reviewed? No Do you have support at home? Yes   Home Care and Equipment/Supplies: Were home health services ordered? not applicable If so, what is the name of the agency? N/a  Has the agency set up a time to come to the patient's home? not applicable Were any new equipment or medical supplies ordered?  No What is the name of the medical supply agency? N/a Were you able to get the supplies/equipment? not applicable Do you have any questions related to the use of the equipment or supplies? No  Functional Questionnaire: (I = Independent and D = Dependent) ADLs: D  Bathing/Dressing- I  Meal Prep- I  Eating- I  Maintaining continence- I  Transferring/Ambulation- I  Managing Meds- I  Follow up appointments reviewed:  PCP Hospital f/u appt confirmed? Yes  Scheduled to see Dr.Sanders on n/a @ n/a. [facility] f/u appt confirmed? No  Scheduled to see n/a on n/a @ n/a. Are transportation arrangements needed? No  If their condition worsens, is the pt aware to call PCP or go to the Emergency Dept.? Yes Was the patient provided with contact information for the PCP's office or ED? Yes Was to pt encouraged to call back with questions or concerns? Yes

## 2022-03-29 ENCOUNTER — Emergency Department (HOSPITAL_COMMUNITY): Payer: Medicare Other

## 2022-03-29 ENCOUNTER — Encounter (HOSPITAL_COMMUNITY): Payer: Self-pay

## 2022-03-29 ENCOUNTER — Emergency Department (HOSPITAL_COMMUNITY)
Admission: EM | Admit: 2022-03-29 | Discharge: 2022-03-29 | Disposition: A | Discharge status: Home / Self Care | Payer: Medicare Other | Attending: Emergency Medicine | Admitting: Emergency Medicine

## 2022-03-29 ENCOUNTER — Telehealth: Payer: Self-pay

## 2022-03-29 ENCOUNTER — Encounter: Payer: Self-pay | Admitting: Internal Medicine

## 2022-03-29 ENCOUNTER — Other Ambulatory Visit: Payer: Self-pay

## 2022-03-29 DIAGNOSIS — I6381 Other cerebral infarction due to occlusion or stenosis of small artery: Secondary | ICD-10-CM | POA: Diagnosis not present

## 2022-03-29 DIAGNOSIS — R791 Abnormal coagulation profile: Secondary | ICD-10-CM | POA: Diagnosis not present

## 2022-03-29 DIAGNOSIS — E119 Type 2 diabetes mellitus without complications: Secondary | ICD-10-CM | POA: Insufficient documentation

## 2022-03-29 DIAGNOSIS — G319 Degenerative disease of nervous system, unspecified: Secondary | ICD-10-CM | POA: Diagnosis not present

## 2022-03-29 DIAGNOSIS — Z79899 Other long term (current) drug therapy: Secondary | ICD-10-CM | POA: Insufficient documentation

## 2022-03-29 DIAGNOSIS — Z7982 Long term (current) use of aspirin: Secondary | ICD-10-CM | POA: Insufficient documentation

## 2022-03-29 DIAGNOSIS — Z794 Long term (current) use of insulin: Secondary | ICD-10-CM | POA: Insufficient documentation

## 2022-03-29 DIAGNOSIS — R531 Weakness: Secondary | ICD-10-CM | POA: Insufficient documentation

## 2022-03-29 DIAGNOSIS — M25562 Pain in left knee: Secondary | ICD-10-CM | POA: Diagnosis not present

## 2022-03-29 LAB — COMPREHENSIVE METABOLIC PANEL
ALT: 11 U/L (ref 0–44)
AST: 15 U/L (ref 15–41)
Albumin: 2.7 g/dL — ABNORMAL LOW (ref 3.5–5.0)
Alkaline Phosphatase: 100 U/L (ref 38–126)
Anion gap: 8 (ref 5–15)
BUN: 44 mg/dL — ABNORMAL HIGH (ref 8–23)
CO2: 24 mmol/L (ref 22–32)
Calcium: 8.5 mg/dL — ABNORMAL LOW (ref 8.9–10.3)
Chloride: 104 mmol/L (ref 98–111)
Creatinine, Ser: 4.66 mg/dL — ABNORMAL HIGH (ref 0.44–1.00)
GFR, Estimated: 9 mL/min — ABNORMAL LOW (ref >60)
Glucose, Bld: 317 mg/dL — ABNORMAL HIGH (ref 70–99)
Potassium: 3.2 mmol/L — ABNORMAL LOW (ref 3.5–5.1)
Sodium: 136 mmol/L (ref 135–145)
Total Bilirubin: 0.5 mg/dL (ref 0.3–1.2)
Total Protein: 6.2 g/dL — ABNORMAL LOW (ref 6.5–8.1)

## 2022-03-29 LAB — CBC
HCT: 29.2 % — ABNORMAL LOW (ref 36.0–46.0)
Hemoglobin: 10 g/dL — ABNORMAL LOW (ref 12.0–15.0)
MCH: 30.7 pg (ref 26.0–34.0)
MCHC: 34.2 g/dL (ref 30.0–36.0)
MCV: 89.6 fL (ref 80.0–100.0)
Platelets: 349 10*3/uL (ref 150–400)
RBC: 3.26 MIL/uL — ABNORMAL LOW (ref 3.87–5.11)
RDW: 12.6 % (ref 11.5–15.5)
WBC: 8.5 10*3/uL (ref 4.0–10.5)
nRBC: 0 % (ref 0.0–0.2)

## 2022-03-29 LAB — DIFFERENTIAL
Abs Immature Granulocytes: 0.04 10*3/uL (ref 0.00–0.07)
Basophils Absolute: 0.1 10*3/uL (ref 0.0–0.1)
Basophils Relative: 1 %
Eosinophils Absolute: 0.3 10*3/uL (ref 0.0–0.5)
Eosinophils Relative: 3 %
Immature Granulocytes: 1 %
Lymphocytes Relative: 16 %
Lymphs Abs: 1.4 10*3/uL (ref 0.7–4.0)
Monocytes Absolute: 0.8 10*3/uL (ref 0.1–1.0)
Monocytes Relative: 9 %
Neutro Abs: 6 10*3/uL (ref 1.7–7.7)
Neutrophils Relative %: 70 %

## 2022-03-29 LAB — APTT: aPTT: 28 seconds (ref 24–36)

## 2022-03-29 LAB — CBG MONITORING, ED
Glucose-Capillary: 331 mg/dL — ABNORMAL HIGH (ref 70–99)
Glucose-Capillary: 298 mg/dL — ABNORMAL HIGH (ref 70–99)

## 2022-03-29 LAB — PROTIME-INR
INR: 1 (ref 0.8–1.2)
Prothrombin Time: 13.2 seconds (ref 11.4–15.2)

## 2022-03-29 LAB — ETHANOL: Alcohol, Ethyl (B): <10 mg/dL (ref <10)

## 2022-03-29 MED ORDER — SODIUM CHLORIDE 0.9 % IV BOLUS
500.0000 mL | Freq: Once | INTRAVENOUS | Status: AC
  Administered 2022-03-29: 500 mL via INTRAVENOUS

## 2022-03-29 NOTE — ED Notes (Signed)
Patient transported to CT 

## 2022-03-29 NOTE — ED Provider Notes (Signed)
[facility] EMERGENCY DEPARTMENT Provider Note   CSN: [id number] Arrival date & time: 03/29/22  1428     History  Chief Complaint  Patient presents with   Cerebrovascular Accident    [name] [name] is a 81 y.o. female.  Patient has a history of diabetes and renal disease and previous stroke.  She is complaining of feeling slightly unsteady and has been speaking slowly.  She had a fall on December 14.  Symptoms have been going on since the fall  The history is provided by the patient. No language interpreter was used.  Weakness Severity:  Mild Onset quality:  Sudden Timing:  Intermittent Chronicity:  New Context: not alcohol use   Relieved by:  Nothing Worsened by:  Nothing Associated symptoms: no abdominal pain, no chest pain, no cough, no diarrhea, no frequency, no headaches and no seizures        Home Medications Prior to Admission medications   Medication Sig Start Date End Date Taking? Authorizing Provider  ACCU-CHEK AVIVA PLUS test strip TEST THREE TIMES DAILY AS DIRECTED 07/20/20   Glendale Chard, MD  amLODipine (NORVASC) 5 MG tablet Take 1 tablet (5 mg total) by mouth daily. 11/04/21   Troy Sine, MD  aspirin EC 81 MG tablet Take 1 tablet (81 mg total) by mouth daily. Patient taking differently: Take 325 mg by mouth daily. 09/16/21   Troy Sine, MD  atorvastatin (LIPITOR) 20 MG tablet Take 1 tablet (20 mg total) by mouth daily. 12/07/21 12/07/22  Glendale Chard, MD  azelastine (ASTELIN) 0.1 % nasal spray Place 1 spray into both nostrils 2 (two) times daily. Use in each nostril as directed 09/07/21   Glendale Chard, MD  B-D UF III MINI PEN NEEDLES 31G X 5 MM MISC Use as directed 4x a day 12/13/21   Glendale Chard, MD  blood glucose meter kit and supplies Dispense based on patient and insurance preference. Use up to four times daily as directed.dx code e11.65 03/08/21   Glendale Chard, MD  Blood Glucose Monitoring Suppl (TRUE METRIX METER) DEVI Use to check blood  sugars 3 times a day. 03/08/21   Glendale Chard, MD  Cholecalciferol (VITAMIN D3) 50 MCG (2000 UT) TABS Take 1 tablet by mouth daily at 12 noon.    [provider]  Ferrous Sulfate (IRON PO) Take 65 mg by mouth daily.    [provider]  furosemide (LASIX) 20 MG tablet Take 1 tablet (20 mg total) by mouth as needed (for swelling once daily). 03/07/22   Lenna Sciara, NP  Glucagon (GVOKE HYPOPEN 2-PACK) 0.5 MG/0.1ML SOAJ Inject 0.5 mg into the skin daily as needed. 08/02/21   Glendale Chard, MD  glucose blood (TRUE METRIX BLOOD GLUCOSE TEST) test strip Use as instructed to check blood sugar 3 times a day. Dx code e11.65 03/08/21   Glendale Chard, MD  hydrALAZINE (APRESOLINE) 50 MG tablet Take 1 tablet (50 mg total) by mouth 3 (three) times daily. 12/16/21   Glendale Chard, MD  hydrOXYzine (ATARAX) 25 MG tablet Take 1 tablet (25 mg total) by mouth 3 (three) times daily as needed for itching. 12/16/21   Glendale Chard, MD  insulin lispro (HUMALOG KWIKPEN) 200 UNIT/ML KwikPen inject 8-10 units into THE SKIN 15 minutes BEFORE THREE main meals of THE DAY 02/10/22   Philemon Kingdom, MD  Lancets (ACCU-CHEK SOFT [facility]) lancets Check blood sugars 3 times daily E11.65 02/12/18   Glendale Chard, MD  latanoprost (XALATAN) 0.005 % ophthalmic solution  Place 1 drop into both eyes at bedtime.  03/17/19   [provider]  metoprolol succinate (TOPROL-XL) 25 MG 24 hr tablet Take 0.5 tablets (12.5 mg total) by mouth daily. 11/04/21   Troy Sine, MD  Multiple Vitamin (MULTIVITAMIN) tablet Take 1 tablet by mouth daily.    [provider]  Semaglutide, 1 MG/DOSE, (OZEMPIC, 1 MG/DOSE,) 2 MG/1.5ML SOPN Inject 1 mg into the skin once a week. 05/20/21   Philemon Kingdom, MD  TRESIBA FLEXTOUCH 200 UNIT/ML FlexTouch Pen inject 20 units into THE SKIN daily 03/20/22   Glendale Chard, MD      Allergies    Brimonidine, Dorzolamide, and Timolol    Review of Systems   Review of Systems   Constitutional:  Negative for appetite change and fatigue.  HENT:  Negative for congestion, ear discharge and sinus pressure.   Eyes:  Negative for discharge.  Respiratory:  Negative for cough.   Cardiovascular:  Negative for chest pain.  Gastrointestinal:  Negative for abdominal pain and diarrhea.  Genitourinary:  Negative for frequency and hematuria.  Musculoskeletal:  Negative for back pain.  Skin:  Negative for rash.  Neurological:  Positive for weakness. Negative for seizures and headaches.  Psychiatric/Behavioral:  Negative for hallucinations.     Physical Exam Updated Vital Signs BP (!) 174/76   Pulse 79   Temp 97.7 F (36.5 C) (Oral)   Resp 18   Ht _0  (1.575 m)   Wt 72.6 kg   SpO2 99%   BMI 29.26 kg/m  Physical Exam Vitals and nursing note reviewed.  Constitutional:      Appearance: She is well-developed.  HENT:     Head: Normocephalic.     Nose: Nose normal.  Eyes:     General: No scleral icterus.    Conjunctiva/sclera: Conjunctivae normal.  Neck:     Thyroid: No thyromegaly.  Cardiovascular:     Rate and Rhythm: Normal rate and regular rhythm.     Heart sounds: No murmur heard.    No friction rub. No gallop.  Pulmonary:     Breath sounds: No stridor. No wheezing or rales.  Chest:     Chest wall: No tenderness.  Abdominal:     General: There is no distension.     Tenderness: There is no abdominal tenderness. There is no rebound.  Musculoskeletal:        General: Normal range of motion.     Cervical back: Neck supple.  Lymphadenopathy:     Cervical: No cervical adenopathy.  Skin:    Findings: No erythema or rash.  Neurological:     Mental Status: She is alert and oriented to person, place, and time.     Motor: No abnormal muscle tone.     Coordination: Coordination normal.  Psychiatric:        Behavior: Behavior normal.     ED Results / Procedures / Treatments   Labs (all labs ordered are listed, but only abnormal results are  displayed) Labs Reviewed  CBC - Abnormal; Notable for the following components:      Result Value   RBC 3.26 (*)    Hemoglobin 10.0 (*)    HCT 29.2 (*)    All other components within normal limits  COMPREHENSIVE METABOLIC PANEL - Abnormal; Notable for the following components:   Potassium 3.2 (*)    Glucose, Bld 317 (*)    BUN 44 (*)    Creatinine, Ser 4.66 (*)    Calcium  8.5 (*)    Total Protein 6.2 (*)    Albumin 2.7 (*)    GFR, Estimated 9 (*)    All other components within normal limits  CBG MONITORING, ED - Abnormal; Notable for the following components:   Glucose-Capillary 331 (*)    All other components within normal limits  CBG MONITORING, ED - Abnormal; Notable for the following components:   Glucose-Capillary 298 (*)    All other components within normal limits  PROTIME-INR  APTT  DIFFERENTIAL  ETHANOL  I-STAT CHEM 8, ED    EKG None  Radiology MR BRAIN WO CONTRAST  Result Date: 03/29/2022 CLINICAL DATA:  Mental status change. EXAM: MRI HEAD WITHOUT CONTRAST TECHNIQUE: Multiplanar, multiecho pulse sequences of the brain and surrounding structures were obtained without intravenous contrast. COMPARISON:  CT head 03/29/2022 FINDINGS: Brain: Negative for acute infarct. Chronic infarct right parietal lobe. Generalized atrophy. Ventricular enlargement consistent with atrophy. Chronic lacunar infarction left lateral basal ganglia. Mild chronic microvascular ischemic change in the white matter. Negative for hemorrhage or mass. Vascular: Normal arterial flow voids. Skull and upper cervical spine: No focal skeletal abnormality. Sinuses/Orbits: Mild mucosal edema paranasal sinuses. Negative orbit Other: None IMPRESSION: 1. Negative for acute infarct. 2. Atrophy and chronic ischemic changes as above. Electronically Signed   By: Franchot Gallo M.D.   On: 03/29/2022 17:22   DG Knee Complete 4 Views Left  Result Date: 03/29/2022 CLINICAL DATA:  Left knee pain after a fall several  days ago. EXAM: LEFT KNEE - COMPLETE 4+ VIEW COMPARISON:  None Available. FINDINGS: No evidence of fracture, dislocation, or joint effusion. No evidence of arthropathy or other focal bone abnormality. Soft tissues are unremarkable. IMPRESSION: Negative. Electronically Signed   By: Misty Stanley M.D.   On: 03/29/2022 15:48   CT HEAD WO CONTRAST  Result Date: 03/29/2022 CLINICAL DATA:  Altered mental status history of recent fall EXAM: CT HEAD WITHOUT CONTRAST TECHNIQUE: Contiguous axial images were obtained from the base of the skull through the vertex without intravenous contrast. RADIATION DOSE REDUCTION: This exam was performed according to the departmental dose-optimization program which includes automated exposure control, adjustment of the mA and/or kV according to patient size and/or use of iterative reconstruction technique. COMPARISON:  CT 12/01/2021, MRI 12/02/2021 FINDINGS: Brain: No acute territorial infarction, hemorrhage, or intracranial mass. Mild chronic small vessel ischemic changes of the white matter. Chronic right temporoparietal and occiput infarct. Chronic lacunar infarct left lentiform nucleus. Stable ventricle size and morphology with ex vacuo dilatation of the right posterior ventricle. Vascular: No hyperdense vessels. Vertebral and carotid vascular calcification Skull: Normal. Negative for fracture or focal lesion. Sinuses/Orbits: No acute finding. Other: None IMPRESSION: 1. No CT evidence for acute intracranial abnormality. 2. Atrophy and chronic small vessel ischemic changes of the white matter. Chronic right temporoparietal and occiput infarct. Electronically Signed   By: Donavan Foil M.D.   On: 03/29/2022 15:44    Procedures Procedures    Medications Ordered in ED Medications  sodium chloride 0.9 % bolus 500 mL (0 mLs Intravenous Stopped 03/29/22 1900)    ED Course/ Medical Decision Making/ A&P                           Medical Decision Making Amount and/or  Complexity of Data Reviewed Labs: ordered. Radiology: ordered.  ,mil This patient presents to the ED for concern of slurred speech and weakness, this involves an extensive number of treatment options, and is  a complaint that carries with it a high risk of complications and morbidity.  The differential diagnosis includes stroke head injury   Co morbidities that complicate the patient evaluation  Diabetes renal disease   Additional history obtained:  Additional history obtained from family External records from outside source obtained and reviewed including hospital record   Lab Tests:  I Ordered, and personally interpreted labs.  The pertinent results include: Creatinine 4.6   Imaging Studies ordered:  I ordered imaging studies including MRI of the brain I independently visualized and interpreted imaging which showed acute stroke I agree with the radiologist interpretation   Cardiac Monitoring: / EKG:  The patient was maintained on a cardiac monitor.  I personally viewed and interpreted the cardiac monitored which showed an underlying rhythm of: Normal sinus rhythm   Consultations Obtained:  No consultant  Problem List / ED Course / Critical interventions / Medication management Weakness No medicines given Reevaluation of the patient after these medicines showed that the patient stayed the same I have reviewed the patients home medicines and have made adjustments as needed   Social Determinants of Health:  None   Test / Admission - Considered:  None  Patient does have weakness and slurred speech.  Possible concussion syndrome.  She will follow-up with her PCP        Final Clinical Impression(s) / ED Diagnoses Final diagnoses:  Weakness    Rx / DC Orders ED Discharge Orders     None         Milton Ferguson, MD 04/06/22 [phone]

## 2022-03-29 NOTE — Telephone Encounter (Signed)
Patient's daughter called concerned about patient's speech being slowed and pt overall being sluggish. She is concerned pt may have had a stroke but pt refuses to go to ER. I have called and spoke with pt and advised her Dr.Sanders really recommends she go to the ER to be evaluated for stroke. Patient's daughter and pt both agreed to going to ER in [place] as it is closer. I also gave daughter [name] a call back and advised her that her mother agreed to go be evaluated. YL,RMA

## 2022-03-29 NOTE — ED Triage Notes (Signed)
Pt presents for eval s/p fall on 12/15. Pt was seen here and d/c. Per pt's family her PCP wanted pt to be evaluated for stroke like symptoms. Pt's LKW was the day before the fall on 12/14. They noted that pt is speaking and responding slower than normal to questions. Family noted pt's balance has been off since the fall. No new focal neuro symptoms noted in triage. Pt with L eye droop that is baseline s/p CVA in 2002.

## 2022-03-29 NOTE — Discharge Instructions (Addendum)
Follow-up with your family doctor in the next couple weeks for recheck.  Stay hydrated

## 2022-03-31 ENCOUNTER — Other Ambulatory Visit: Payer: Self-pay

## 2022-03-31 DIAGNOSIS — E7800 Pure hypercholesterolemia, unspecified: Secondary | ICD-10-CM

## 2022-03-31 MED ORDER — ATORVASTATIN CALCIUM 20 MG PO TABS: 20.0000 mg | ORAL_TABLET | Freq: Every day | ORAL | 2 refills | Status: DC

## 2022-04-11 ENCOUNTER — Other Ambulatory Visit: Payer: Self-pay | Admitting: Internal Medicine

## 2022-04-11 DIAGNOSIS — I130 Hypertensive heart and chronic kidney disease with heart failure and stage 1 through stage 4 chronic kidney disease, or unspecified chronic kidney disease: Secondary | ICD-10-CM

## 2022-04-11 DIAGNOSIS — E1165 Type 2 diabetes mellitus with hyperglycemia: Secondary | ICD-10-CM

## 2022-04-11 DIAGNOSIS — N1832 Chronic kidney disease, stage 3b: Secondary | ICD-10-CM

## 2022-04-13 ENCOUNTER — Telehealth: Payer: Self-pay | Admitting: *Deleted

## 2022-04-13 ENCOUNTER — Encounter: Payer: Self-pay | Admitting: *Deleted

## 2022-04-13 ENCOUNTER — Telehealth: Payer: Self-pay

## 2022-04-13 NOTE — Telephone Encounter (Signed)
Contacted patients daughter [name]) regarding PREP Class referral. States her mother is interested in participating at the [facility]. She does not drive but daughter states she will arrange for transportation. Will notify with next available class in January/February.

## 2022-04-13 NOTE — Progress Notes (Cosign Needed)
Care Management & Coordination Services Pharmacy Team  Reason for Encounter: Medication coordination and delivery  Contacted patient on 04/13/2022 to discuss medications   Recent office visits:  03-22-2022 Glendale Chard, MD. RBC= 3.69, Hema= 33.5. Glucose= 101, BUN= 29, Creatinine= 4.21, eGFR= 10, BUN/Creatinine= 7, Sodium= 145, Potassium= 3.2, Chloride= 107, CO2= 19, Albumin= 3.3. A1C= 8.4. Cholesterol= 314, Trig= 300, VLDL= 60, LDL= 204, Chol/HDL= 6.3. Micro/Creat= [id number]. Abnormal UA.   Recent consult visits:  None  Hospital visits:  Medication Reconciliation was completed by comparing discharge summary, patient's EMR and Pharmacy list, and upon discussion with patient.  Admitted to the hospital on 03-29-2022 due to Weakness. Discharge date was 03-29-2022. Discharged from [place]?Medications Started at [facility] Discharge:?? None  Medication Changes at Hospital Discharge: None  Medications Discontinued at Hospital Discharge: None  Medications that remain the same after Hospital Discharge:??  -All other medications will remain the same.    Hospital visits:  Medication Reconciliation was completed by comparing discharge summary, patient's EMR and Pharmacy list, and upon discussion with patient.  Admitted to the hospital on 03-24-2022 due to a fall. Discharge date was 03-24-2022. Discharged from [place]?Medications Started at [facility] Discharge:?? None  Medication Changes at Hospital Discharge: None  Medications Discontinued at Hospital Discharge: None  Medications that remain the same after Hospital Discharge:??  -All other medications will remain the same.    Medications: Outpatient Encounter Medications as of 04/13/2022  Medication Sig Note   ACCU-CHEK AVIVA PLUS test strip TEST THREE TIMES DAILY AS DIRECTED    amLODipine (NORVASC) 5 MG tablet Take 1 tablet (5 mg total) by mouth daily.    aspirin EC 81 MG tablet Take 1 tablet  (81 mg total) by mouth daily. (Patient taking differently: Take 325 mg by mouth daily.)    atorvastatin (LIPITOR) 20 MG tablet Take 1 tablet (20 mg total) by mouth daily.    azelastine (ASTELIN) 0.1 % nasal spray Place 1 spray into both nostrils 2 (two) times daily. Use in each nostril as directed    B-D UF III MINI PEN NEEDLES 31G X 5 MM MISC Use as directed 4x a day    blood glucose meter kit and supplies Dispense based on patient and insurance preference. Use up to four times daily as directed.dx code e11.65    Blood Glucose Monitoring Suppl (TRUE METRIX METER) DEVI Use to check blood sugars 3 times a day.    Cholecalciferol (VITAMIN D3) 50 MCG (2000 UT) TABS Take 1 tablet by mouth daily at 12 noon.    Ferrous Sulfate (IRON PO) Take 65 mg by mouth daily.    furosemide (LASIX) 20 MG tablet Take 1 tablet (20 mg total) by mouth as needed (for swelling once daily).    Glucagon (GVOKE HYPOPEN 2-PACK) 0.5 MG/0.1ML SOAJ Inject 0.5 mg into the skin daily as needed.    glucose blood (TRUE METRIX BLOOD GLUCOSE TEST) test strip Use as instructed to check blood sugar 3 times a day. Dx code e11.65    hydrALAZINE (APRESOLINE) 50 MG tablet Take 1 tablet (50 mg total) by mouth 3 (three) times daily.    hydrOXYzine (ATARAX) 25 MG tablet Take 1 tablet (25 mg total) by mouth 3 (three) times daily as needed for itching.    insulin lispro (HUMALOG KWIKPEN) 200 UNIT/ML KwikPen inject 8-10 units into THE SKIN 15 minutes BEFORE THREE main meals of THE DAY    Lancets (ACCU-CHEK SOFT TOUCH)  lancets Check blood sugars 3 times daily E11.65    latanoprost (XALATAN) 0.005 % ophthalmic solution Place 1 drop into both eyes at bedtime.     metoprolol succinate (TOPROL-XL) 25 MG 24 hr tablet Take 0.5 tablets (12.5 mg total) by mouth daily.    Multiple Vitamin (MULTIVITAMIN) tablet Take 1 tablet by mouth daily.    Semaglutide, 1 MG/DOSE, (OZEMPIC, 1 MG/DOSE,) 2 MG/1.5ML SOPN Inject 1 mg into the skin once a week. 09/02/2021:  Fridays   TRESIBA FLEXTOUCH 200 UNIT/ML FlexTouch Pen inject 20 units into THE SKIN daily    No facility-administered encounter medications on file as of 04/13/2022.   BP Readings from Last 3 Encounters:  03/29/22 (!) 174/76  03/24/22 (!) 149/90  03/22/22 108/72    Pulse Readings from Last 3 Encounters:  03/29/22 79  03/24/22 78  03/22/22 81    Lab Results  Component Value Date/Time   HGBA1C 8.4 (H) 03/22/2022 09:48 AM   HGBA1C 7.2 (H) 11/29/2021 02:23 PM   Lab Results  Component Value Date   CREATININE 4.66 (H) 03/29/2022   BUN 44 (H) 03/29/2022   GFRNONAA 9 (L) 03/29/2022   GFRAA 20 (L) 03/15/2020   NA 136 03/29/2022   K 3.2 (L) 03/29/2022   CALCIUM 8.5 (L) 03/29/2022   CO2 24 03/29/2022     Last adherence delivery date: 03-24-2022   Patient is due for next adherence delivery on: 04-25-2022  Spoke with patient on 04-13-2022 reviewed medications and coordinated delivery.  This delivery to include: Vials  30 Days  Atorvastatin 20 mg daily  Ozempic 1 mg weekly  Cetirizine 10 mg daily  Hydralazine 50 mg 3 times daily  Metoprolol 25 mg 1/2 tablet daily  Humalog 8-10 units 15 mins before meals  Amlodipine 5 mg daily  Tresiba 20 units daily  Hydroxyzine 25 mg 1-2 tabs at bedtime PRN  Aspirin 81 mg daily  Latanoprost 1 drop into both eyes at bedtime  Patient's daughter declined the following medications this month: Humalog- Plenty supply Hydralazine- Plenty supply Lasix- Due 06-12-2022 Needles- Due 05-17-2022  Refills requested from providers include: Latanoprost- sent to specialist Humalog- Sent to specialist  Confirmed delivery date of 04-25-2022, advised patient that pharmacy will contact them the morning of delivery.   Any concerns about your medications? No  How often do you forget or accidentally miss a dose? Rarely Patient's daughter stated patient has over supply of Humalog and hydralazine because she feels she misses a dose of each. Patient's  daughter stated patient said she is taking medications as prescribed.  Do you use a pillbox? Yes  Is patient in packaging No  Recent blood pressure readings are as follows: 174/76 03-29-2022  Recent blood glucose readings are as follows: 8.4 03-22-2022   Cycle dispensing form sent to [name] for review.   Malecca Ishmael Holter

## 2022-04-20 ENCOUNTER — Other Ambulatory Visit: Payer: Self-pay

## 2022-04-20 ENCOUNTER — Telehealth: Payer: Self-pay | Admitting: Nurse Practitioner

## 2022-04-20 DIAGNOSIS — E113311 Type 2 diabetes mellitus with moderate nonproliferative diabetic retinopathy with macular edema, right eye: Secondary | ICD-10-CM | POA: Diagnosis not present

## 2022-04-20 DIAGNOSIS — Z794 Long term (current) use of insulin: Secondary | ICD-10-CM | POA: Diagnosis not present

## 2022-04-20 DIAGNOSIS — H401133 Primary open-angle glaucoma, bilateral, severe stage: Secondary | ICD-10-CM | POA: Diagnosis not present

## 2022-04-20 DIAGNOSIS — Z87898 Personal history of other specified conditions: Secondary | ICD-10-CM

## 2022-04-20 DIAGNOSIS — H4052X3 Glaucoma secondary to other eye disorders, left eye, severe stage: Secondary | ICD-10-CM | POA: Diagnosis not present

## 2022-04-20 DIAGNOSIS — H34812 Central retinal vein occlusion, left eye, with macular edema: Secondary | ICD-10-CM | POA: Diagnosis not present

## 2022-04-20 DIAGNOSIS — I5032 Chronic diastolic (congestive) heart failure: Secondary | ICD-10-CM

## 2022-04-20 DIAGNOSIS — Z7985 Long-term (current) use of injectable non-insulin antidiabetic drugs: Secondary | ICD-10-CM | POA: Diagnosis not present

## 2022-04-20 NOTE — Telephone Encounter (Signed)
Spoke with staff from the monitor department. She states "looks like the order was linked to the drs appt and not marked as a future order so it never fell into our workque". She states "my question would be if it was for syncope, has she had any since and does she still need the monitor since its been so long ago. Attempted to call pt's daughter, no answer at this time. Voicemail full. Unable to leave voicemail.

## 2022-04-20 NOTE — Telephone Encounter (Signed)
Patient's daughter called stating last time they were in the office an even monitor was going to be ordered for her mother.  They never received one.

## 2022-04-20 NOTE — Telephone Encounter (Signed)
New order placed for 30 day event monitor (marked future).

## 2022-04-20 NOTE — Telephone Encounter (Signed)
Tried calling pt. VM was full.

## 2022-04-21 ENCOUNTER — Other Ambulatory Visit: Payer: Self-pay | Admitting: Nurse Practitioner

## 2022-04-21 DIAGNOSIS — Z87898 Personal history of other specified conditions: Secondary | ICD-10-CM

## 2022-04-21 DIAGNOSIS — R55 Syncope and collapse: Secondary | ICD-10-CM

## 2022-04-21 DIAGNOSIS — Z8673 Personal history of transient ischemic attack (TIA), and cerebral infarction without residual deficits: Secondary | ICD-10-CM

## 2022-04-21 DIAGNOSIS — I5032 Chronic diastolic (congestive) heart failure: Secondary | ICD-10-CM

## 2022-04-24 ENCOUNTER — Encounter: Payer: Self-pay | Admitting: Internal Medicine

## 2022-04-24 ENCOUNTER — Encounter: Payer: Self-pay | Admitting: *Deleted

## 2022-04-24 NOTE — Progress Notes (Signed)
The participant was brought to the first  PREP Class today by her daughter, [name] [name]. Her initial assessment visit was to follow class as the patient does not drive and was unable to meet me at an earlier date. [name] [name] uses a walker to ambulate and her gait is quite unsteady without someone assisting her even with the walker support. She was unable to steer the walker independently today. At this time, she is not an appropriate candidate for PREP in a class type setting where she would need individual assistance to negotiate even the most basic class demands. She presents a significant fall risk. I do believe she would benefit greatly from physical therapy 1:1. If she gains some strength and more stability we could perhaps revisit PREP.

## 2022-04-26 NOTE — Telephone Encounter (Signed)
Spoke with pts daughter. Monitor hasn't arrived yet. Pts daughter will contact our office Monday if the monitor doesn't arrive.

## 2022-04-27 ENCOUNTER — Telehealth: Payer: Self-pay

## 2022-04-27 NOTE — Progress Notes (Signed)
Care Management & Coordination Services Pharmacy Team  Reason for Encounter: General adherence update   Contacted patient on 04/27/2022 for general disease state and medication adherence call.   Recent office visits:  None  Recent consult visits:  04-24-2022 Norris Cross, RN. PREP class  04-20-2022 [name], MD (ophthalmology). Follow up visit.  Hospital visits:  Medication Reconciliation was completed by comparing discharge summary, patient's EMR and Pharmacy list, and upon discussion with patient.   Admitted to the hospital on 03-29-2022 due to Weakness. Discharge date was 03-29-2022. Discharged from [place]?Medications Started at [facility] Discharge:?? None   Medication Changes at Hospital Discharge: None   Medications Discontinued at Hospital Discharge: None   Medications that remain the same after Hospital Discharge:??  -All other medications will remain the same.     Hospital visits:  Medication Reconciliation was completed by comparing discharge summary, patient's EMR and Pharmacy list, and upon discussion with patient.   Admitted to the hospital on 03-24-2022 due to a fall. Discharge date was 03-24-2022. Discharged from [place]?Medications Started at [facility] Discharge:?? None   Medication Changes at Hospital Discharge: None   Medications Discontinued at Hospital Discharge: None   Medications that remain the same after Hospital Discharge:??  -All other medications will remain the same.      Medications: Outpatient Encounter Medications as of 04/27/2022  Medication Sig Note   ACCU-CHEK AVIVA PLUS test strip TEST THREE TIMES DAILY AS DIRECTED    amLODipine (NORVASC) 5 MG tablet Take 1 tablet (5 mg total) by mouth daily.    aspirin EC 81 MG tablet Take 1 tablet (81 mg total) by mouth daily. (Patient taking differently: Take 325 mg by mouth daily.)    atorvastatin (LIPITOR) 20 MG tablet Take 1 tablet (20  mg total) by mouth daily.    azelastine (ASTELIN) 0.1 % nasal spray Place 1 spray into both nostrils 2 (two) times daily. Use in each nostril as directed    B-D UF III MINI PEN NEEDLES 31G X 5 MM MISC Use as directed 4x a day    blood glucose meter kit and supplies Dispense based on patient and insurance preference. Use up to four times daily as directed.dx code e11.65    Blood Glucose Monitoring Suppl (TRUE METRIX METER) DEVI Use to check blood sugars 3 times a day.    Cholecalciferol (VITAMIN D3) 50 MCG (2000 UT) TABS Take 1 tablet by mouth daily at 12 noon.    Ferrous Sulfate (IRON PO) Take 65 mg by mouth daily.    furosemide (LASIX) 20 MG tablet Take 1 tablet (20 mg total) by mouth as needed (for swelling once daily).    Glucagon (GVOKE HYPOPEN 2-PACK) 0.5 MG/0.1ML SOAJ Inject 0.5 mg into the skin daily as needed.    glucose blood (TRUE METRIX BLOOD GLUCOSE TEST) test strip Use as instructed to check blood sugar 3 times a day. Dx code e11.65    hydrALAZINE (APRESOLINE) 50 MG tablet Take 1 tablet (50 mg total) by mouth 3 (three) times daily.    hydrOXYzine (ATARAX) 25 MG tablet Take 1 tablet (25 mg total) by mouth 3 (three) times daily as needed for itching.    insulin lispro (HUMALOG KWIKPEN) 200 UNIT/ML KwikPen inject 8-10 units into THE SKIN 15 minutes BEFORE THREE main meals of THE DAY    Lancets (ACCU-CHEK SOFT TOUCH) lancets Check blood sugars 3 times daily E11.65  latanoprost (XALATAN) 0.005 % ophthalmic solution Place 1 drop into both eyes at bedtime.     metoprolol succinate (TOPROL-XL) 25 MG 24 hr tablet Take 0.5 tablets (12.5 mg total) by mouth daily.    Multiple Vitamin (MULTIVITAMIN) tablet Take 1 tablet by mouth daily.    Semaglutide, 1 MG/DOSE, (OZEMPIC, 1 MG/DOSE,) 2 MG/1.5ML SOPN Inject 1 mg into the skin once a week. 09/02/2021: Fridays   TRESIBA FLEXTOUCH 200 UNIT/ML FlexTouch Pen inject 20 units into THE SKIN daily    No facility-administered encounter medications on file  as of 04/27/2022.    Recent vitals BP Readings from Last 3 Encounters:  03/29/22 (!) 174/76  03/24/22 (!) 149/90  03/22/22 108/72   Pulse Readings from Last 3 Encounters:  03/29/22 79  03/24/22 78  03/22/22 81   Wt Readings from Last 3 Encounters:  03/29/22 160 lb (72.6 kg)  03/22/22 150 lb 6.4 oz (68.2 kg)  01/17/22 166 lb (75.3 kg)   BMI Readings from Last 3 Encounters:  03/29/22 29.26 kg/m  03/22/22 27.51 kg/m  01/17/22 30.36 kg/m    Recent lab results    Component Value Date/Time   NA 136 03/29/2022 1610   NA 145 (H) 03/22/2022 0948   K 3.2 (L) 03/29/2022 1610   CL 104 03/29/2022 1610   CO2 24 03/29/2022 1610   GLUCOSE 317 (H) 03/29/2022 1610   BUN 44 (H) 03/29/2022 1610   BUN 29 (H) 03/22/2022 0948   CREATININE 4.66 (H) 03/29/2022 1610   CREATININE 1.95 (H) 10/09/2018 0905   CALCIUM 8.5 (L) 03/29/2022 1610    Lab Results  Component Value Date   CREATININE 4.66 (H) 03/29/2022   EGFR 10 (L) 03/22/2022   GFRNONAA 9 (L) 03/29/2022   GFRAA 20 (L) 03/15/2020   Lab Results  Component Value Date/Time   HGBA1C 8.4 (H) 03/22/2022 09:48 AM   HGBA1C 7.2 (H) 11/29/2021 02:23 PM   MICROALBUR 150 09/09/2020 12:43 PM   MICROALBUR 150 09/04/2019 02:19 PM    Lab Results  Component Value Date   CHOL 314 (H) 03/22/2022   HDL 50 03/22/2022   LDLCALC 204 (H) 03/22/2022   TRIG 300 (H) 03/22/2022   CHOLHDL 6.3 (H) 03/22/2022     What concerns do you have about your medications? Patient's daughter stated no  The patient denies side effects with their medications.   How often do you forget or accidentally miss a dose? Never  Do you use a pillbox? Yes  Are you having any problems getting your medications from your pharmacy? No  Has the cost of your medications been a concern? No  Since last visit with PharmD, no interventions have been made.   The patient has had an ED visit since last contact.   The patient denies problems with their health.   Patient  denies concerns or questions for [name], PharmD at this time.   Counseled patient on: Great job taking medications   Care Gaps: Annual wellness visit in last year? Yes Covid booster overdue  If Diabetic: Last eye exam / retinopathy screening: 04-20-2022 Last diabetic foot exam: None Last UACR: 03-22-2022  Star Rating Drugs:  Ozempic 1 mg- Last filled 04-21-2022 28 [facility]. Previous 03-22-2022 28 DS. Atorvastatin 20 mg- Last filled 04-21-2022 28 [facility]. Previous 03-22-2022 28 DS.   [facility] Pharmacist Assistant [phone]

## 2022-05-01 ENCOUNTER — Ambulatory Visit: Payer: Medicare Other | Attending: Nurse Practitioner

## 2022-05-01 ENCOUNTER — Telehealth: Payer: Self-pay | Admitting: *Deleted

## 2022-05-01 DIAGNOSIS — Z87898 Personal history of other specified conditions: Secondary | ICD-10-CM | POA: Diagnosis not present

## 2022-05-01 DIAGNOSIS — R55 Syncope and collapse: Secondary | ICD-10-CM | POA: Diagnosis not present

## 2022-05-01 DIAGNOSIS — I5032 Chronic diastolic (congestive) heart failure: Secondary | ICD-10-CM

## 2022-05-01 DIAGNOSIS — Z8673 Personal history of transient ischemic attack (TIA), and cerebral infarction without residual deficits: Secondary | ICD-10-CM | POA: Diagnosis not present

## 2022-05-01 NOTE — Telephone Encounter (Signed)
New Message:      : Please call her daughter. She have questions about the Monitor. ,

## 2022-05-01 NOTE — Telephone Encounter (Signed)
Daughter will be setting up her mothers monitor some time this week.  Her question was, if they set it up on [place], would her mother still have to stay within 10 feet of phone.  I do not believe she would but I referred her on to [employer] with that question.

## 2022-05-02 DIAGNOSIS — R55 Syncope and collapse: Secondary | ICD-10-CM | POA: Diagnosis not present

## 2022-05-02 DIAGNOSIS — Z8673 Personal history of transient ischemic attack (TIA), and cerebral infarction without residual deficits: Secondary | ICD-10-CM | POA: Diagnosis not present

## 2022-05-03 ENCOUNTER — Other Ambulatory Visit: Payer: Self-pay | Admitting: Internal Medicine

## 2022-05-03 DIAGNOSIS — G8929 Other chronic pain: Secondary | ICD-10-CM

## 2022-05-03 DIAGNOSIS — E119 Type 2 diabetes mellitus without complications: Secondary | ICD-10-CM | POA: Diagnosis not present

## 2022-05-03 DIAGNOSIS — R269 Unspecified abnormalities of gait and mobility: Secondary | ICD-10-CM

## 2022-05-03 DIAGNOSIS — Z794 Long term (current) use of insulin: Secondary | ICD-10-CM | POA: Diagnosis not present

## 2022-05-08 DIAGNOSIS — E11319 Type 2 diabetes mellitus with unspecified diabetic retinopathy without macular edema: Secondary | ICD-10-CM | POA: Diagnosis not present

## 2022-05-08 DIAGNOSIS — Z7985 Long-term (current) use of injectable non-insulin antidiabetic drugs: Secondary | ICD-10-CM | POA: Diagnosis not present

## 2022-05-08 DIAGNOSIS — Z8673 Personal history of transient ischemic attack (TIA), and cerebral infarction without residual deficits: Secondary | ICD-10-CM | POA: Diagnosis not present

## 2022-05-08 DIAGNOSIS — N184 Chronic kidney disease, stage 4 (severe): Secondary | ICD-10-CM | POA: Diagnosis not present

## 2022-05-08 DIAGNOSIS — E1159 Type 2 diabetes mellitus with other circulatory complications: Secondary | ICD-10-CM | POA: Diagnosis not present

## 2022-05-08 DIAGNOSIS — E1122 Type 2 diabetes mellitus with diabetic chronic kidney disease: Secondary | ICD-10-CM | POA: Diagnosis not present

## 2022-05-08 DIAGNOSIS — E1165 Type 2 diabetes mellitus with hyperglycemia: Secondary | ICD-10-CM | POA: Diagnosis not present

## 2022-05-08 DIAGNOSIS — Z794 Long term (current) use of insulin: Secondary | ICD-10-CM | POA: Diagnosis not present

## 2022-05-12 ENCOUNTER — Telehealth: Payer: Self-pay

## 2022-05-12 NOTE — Progress Notes (Signed)
Care Management & Coordination Services Pharmacy Team  Reason for Encounter: Medication coordination and delivery  Contacted patient to discuss medications and coordinate delivery from [facility]. Spoke with family on 05/16/2022  Cycle dispensing form sent to [name] for review.   Last adherence delivery date: 04-25-2022  Patient is due for next adherence delivery on: 05-24-2022  This delivery to include: Vials  30 Days  Atorvastatin 20 mg daily  Ozempic 1 mg weekly  Cetirizine 10 mg daily  Metoprolol 25 mg 1/2 tablet daily   Needles  Amlodipine 5 mg daily  Hydroxyzine 25 mg 1-2 tabs at bedtime PRN  Aspirin 81 mg daily   Patient declined the following medications this month: Latanoprost- plenty supply [place]- Hold off because patient is starting insulin pump  Refills requested from providers include: Ozempic- Sent to specialist by [facility]  Confirmed delivery date of 05-24-2022, advised patient that pharmacy will contact them the morning of delivery.   Any concerns about your medications? No  How often do you forget or accidentally miss a dose? Never  Do you use a pillbox? Yes  Is patient in packaging No  If yes  What is the date on your next pill pack?  Any concerns or issues with your packaging?   Recent blood pressure readings are as follows: 150/84, 154/84  Recent blood glucose readings are as follows: 02-05 73 fasting, 02-04 at bedtime 261   Chart review: Recent office visits:  None  Recent consult visits:  05-08-2022 William Hamburger, MD (Endocrinology). Follow up visit  Hospital visits:  Medication Reconciliation was completed by comparing discharge summary, patient's EMR and Pharmacy list, and upon discussion with patient.   Admitted to the hospital on 03-29-2022 due to Weakness. Discharge date was 03-29-2022. Discharged from [place]?Medications Started at [facility] Discharge:?? None   Medication Changes at  Hospital Discharge: None   Medications Discontinued at Hospital Discharge: None   Medications that remain the same after Hospital Discharge:??  -All other medications will remain the same.     Hospital visits:  Medication Reconciliation was completed by comparing discharge summary, patient's EMR and Pharmacy list, and upon discussion with patient.   Admitted to the hospital on 03-24-2022 due to a fall. Discharge date was 03-24-2022. Discharged from [place]?Medications Started at [facility] Discharge:?? None   Medication Changes at Hospital Discharge: None   Medications Discontinued at Hospital Discharge: None   Medications that remain the same after Hospital Discharge:??  -All other medications will remain the same  Medications: Outpatient Encounter Medications as of 05/12/2022  Medication Sig Note   ACCU-CHEK AVIVA PLUS test strip TEST THREE TIMES DAILY AS DIRECTED    amLODipine (NORVASC) 5 MG tablet Take 1 tablet (5 mg total) by mouth daily.    aspirin EC 81 MG tablet Take 1 tablet (81 mg total) by mouth daily. (Patient taking differently: Take 325 mg by mouth daily.)    atorvastatin (LIPITOR) 20 MG tablet Take 1 tablet (20 mg total) by mouth daily.    azelastine (ASTELIN) 0.1 % nasal spray Place 1 spray into both nostrils 2 (two) times daily. Use in each nostril as directed    B-D UF III MINI PEN NEEDLES 31G X 5 MM MISC Use as directed 4x a day    blood glucose meter kit and supplies Dispense based on patient and insurance preference. Use up to four times daily as directed.dx code e11.65  Blood Glucose Monitoring Suppl (TRUE METRIX METER) DEVI Use to check blood sugars 3 times a day.    Cholecalciferol (VITAMIN D3) 50 MCG (2000 UT) TABS Take 1 tablet by mouth daily at 12 noon.    Ferrous Sulfate (IRON PO) Take 65 mg by mouth daily.    furosemide (LASIX) 20 MG tablet Take 1 tablet (20 mg total) by mouth as needed (for swelling once daily).    Glucagon  (GVOKE HYPOPEN 2-PACK) 0.5 MG/0.1ML SOAJ Inject 0.5 mg into the skin daily as needed.    glucose blood (TRUE METRIX BLOOD GLUCOSE TEST) test strip Use as instructed to check blood sugar 3 times a day. Dx code e11.65    hydrALAZINE (APRESOLINE) 50 MG tablet Take 1 tablet (50 mg total) by mouth 3 (three) times daily.    hydrOXYzine (ATARAX) 25 MG tablet Take 1 tablet (25 mg total) by mouth 3 (three) times daily as needed for itching.    insulin lispro (HUMALOG KWIKPEN) 200 UNIT/ML KwikPen inject 8-10 units into THE SKIN 15 minutes BEFORE THREE main meals of THE DAY    Lancets (ACCU-CHEK SOFT TOUCH) lancets Check blood sugars 3 times daily E11.65    latanoprost (XALATAN) 0.005 % ophthalmic solution Place 1 drop into both eyes at bedtime.     metoprolol succinate (TOPROL-XL) 25 MG 24 hr tablet Take 0.5 tablets (12.5 mg total) by mouth daily.    Multiple Vitamin (MULTIVITAMIN) tablet Take 1 tablet by mouth daily.    Semaglutide, 1 MG/DOSE, (OZEMPIC, 1 MG/DOSE,) 2 MG/1.5ML SOPN Inject 1 mg into the skin once a week. 09/02/2021: Fridays   TRESIBA FLEXTOUCH 200 UNIT/ML FlexTouch Pen inject 20 units into THE SKIN daily    No facility-administered encounter medications on file as of 05/12/2022.   BP Readings from Last 3 Encounters:  03/29/22 (!) 174/76  03/24/22 (!) 149/90  03/22/22 108/72    Pulse Readings from Last 3 Encounters:  03/29/22 79  03/24/22 78  03/22/22 81    Lab Results  Component Value Date/Time   HGBA1C 8.4 (H) 03/22/2022 09:48 AM   HGBA1C 7.2 (H) 11/29/2021 02:23 PM   Lab Results  Component Value Date   CREATININE 4.66 (H) 03/29/2022   BUN 44 (H) 03/29/2022   GFRNONAA 9 (L) 03/29/2022   GFRAA 20 (L) 03/15/2020   NA 136 03/29/2022   K 3.2 (L) 03/29/2022   CALCIUM 8.5 (L) 03/29/2022   CO2 24 03/29/2022    [facility] Clinical Pharmacist Assistant [phone]

## 2022-05-15 DIAGNOSIS — Z794 Long term (current) use of insulin: Secondary | ICD-10-CM | POA: Diagnosis not present

## 2022-05-15 DIAGNOSIS — E11649 Type 2 diabetes mellitus with hypoglycemia without coma: Secondary | ICD-10-CM | POA: Diagnosis not present

## 2022-05-15 DIAGNOSIS — Z7985 Long-term (current) use of injectable non-insulin antidiabetic drugs: Secondary | ICD-10-CM | POA: Diagnosis not present

## 2022-05-16 ENCOUNTER — Other Ambulatory Visit: Payer: Medicare Other

## 2022-05-16 ENCOUNTER — Ambulatory Visit (INDEPENDENT_AMBULATORY_CARE_PROVIDER_SITE_OTHER): Payer: Medicare Other

## 2022-05-16 ENCOUNTER — Encounter: Payer: Self-pay | Admitting: Emergency Medicine

## 2022-05-16 ENCOUNTER — Other Ambulatory Visit: Payer: Self-pay

## 2022-05-16 ENCOUNTER — Ambulatory Visit
Admission: EM | Admit: 2022-05-16 | Discharge: 2022-05-16 | Disposition: A | Discharge status: Home / Self Care | Payer: Medicare Other | Attending: Family Medicine | Admitting: Family Medicine

## 2022-05-16 DIAGNOSIS — M7989 Other specified soft tissue disorders: Secondary | ICD-10-CM

## 2022-05-16 DIAGNOSIS — M79641 Pain in right hand: Secondary | ICD-10-CM

## 2022-05-16 NOTE — ED Triage Notes (Signed)
Pt family reports right hand pain and swelling since this weekend. Denies any known injury but reports has been using walker at home and reports may have hit right hand on entry way. Lidocaine patch noted to right hand in triage.

## 2022-05-16 NOTE — Discharge Instructions (Signed)
We have drawn a uric acid level today to see if this may be gout.  These results should be available tomorrow and someone should reach out if your results are abnormal.  The x-ray today was negative for any fractures.  Use Voltaren gel, ice, elevation, Tylenol and follow-up with orthopedics if not resolving over the next few days to a week.

## 2022-05-16 NOTE — ED Provider Notes (Signed)
[facility]    CSN: [id number] Arrival date & time: 05/16/22  1745      History   Chief Complaint Chief Complaint  Patient presents with   Hand Injury    HPI [name] [name] is a 82 y.o. female.   Presenting today with diffuse right hand pain and swelling that started about 4 days ago.  She is unsure of any injury but may have hit the hand on a door frame at some point while using her walker over the weekend.  Possibly some mild redness to the area but no rashes, lesions, fever, chills, numbness, tingling, loss of range of motion though very difficult to make a fist due to stiffness and pain.  So far trying topical over-the-counter pain creams, ice, Tylenol with very minimal relief.  No past history of similar issues.  Past medical history significant for insulin-dependent diabetes, chronic kidney disease, heart failure, hypertension.   Past Medical History:  Diagnosis Date   Diabetes mellitus ([place])    H/O: CVA (cerebrovascular accident) 2002   HTN (hypertension)    Hyperlipidemia    Pulmonary HTN ([place])    mild by echo   Patient Active Problem List   Diagnosis Date Noted   Stage 3b chronic kidney disease ([place]) 12/26/2021   Gait abnormality 12/26/2021   Hypoglycemia 12/26/2021   History of falling 12/26/2021   Aspirin long-term use 12/26/2021   Hypermagnesemia 12/02/2021   Hypokalemia 12/02/2021   Acute kidney injury superimposed on chronic kidney disease ([place]) 12/02/2021   Prolonged QT interval 12/02/2021   Elevated troponin 12/02/2021   Syncope and collapse 12/01/2021   Anemia due to chronic kidney disease 09/03/2021   Acute respiratory failure with hypoxia ([place]) 09/03/2021   Chronic diastolic CHF (congestive heart failure) ([place]) 09/02/2021   Hypertensive urgency 09/02/2021   Chronic kidney disease (CKD), stage IV (severe) ([place]) 09/02/2021   Vertigo 02/27/2021   Wheezing 02/27/2021   BPPV (benign paroxysmal positional vertigo), left 01/24/2021    Diabetic polyneuropathy associated with type 2 diabetes mellitus ([place]) 09/20/2020   Hypertensive heart and renal disease 09/20/2020   Right hand paresthesia 09/20/2020   Diabetic retinopathy associated with type 2 diabetes mellitus ([place]) 09/20/2020   Ptosis of eyelid, left 09/20/2020   Class 1 obesity due to excess calories with serious comorbidity and body mass index (BMI) of 30.0 to 30.9 in adult 09/20/2020   Mixed hyperlipidemia 10/09/2018   Overweight (BMI 25.0-29.9) 10/09/2018   Central retinal vein occlusion with macular edema of left eye 05/24/2017   Blindness, one eye, unspecified eye 08/24/2016   Left ventricular diastolic dysfunction, NYHA class 1 02/06/2013   Essential hypertension    Poorly controlled type 2 diabetes mellitus with circulatory disorder ([place])    Diabetic macular edema ([place]) 08/22/2012   Primary open angle glaucoma of both eyes, severe stage 08/07/2011    Past Surgical History:  Procedure Laterality Date   ABDOMINAL HYSTERECTOMY     CARDIAC CATHETERIZATION  03/17/2003   Normal, patent coronary arteries. Normal LV systolic function   CARDIOVASCULAR STRESS TEST  08/28/2006   Small sized, moderate ischemiain the Basal Inferoseptal, Basal Inferior, Mid Inferior and Apical Inferior regions   TRANSTHORACIC ECHOCARDIOGRAM  02/17/2010   EF >55%, moderate-severely thickened septum, mild-moderate mitral annular calcification   OB History   No obstetric history on file.      Home Medications    Prior to Admission medications   Medication Sig Start Date End Date Taking? Authorizing Provider  ACCU-CHEK AVIVA  PLUS test strip TEST THREE TIMES DAILY AS DIRECTED 07/20/20   Glendale Chard, MD  amLODipine (NORVASC) 5 MG tablet Take 1 tablet (5 mg total) by mouth daily. 11/04/21   Troy Sine, MD  aspirin EC 81 MG tablet Take 1 tablet (81 mg total) by mouth daily. Patient taking differently: Take 325 mg by mouth daily. 09/16/21   Troy Sine, MD  atorvastatin  (LIPITOR) 20 MG tablet Take 1 tablet (20 mg total) by mouth daily. 03/31/22 03/31/23  Glendale Chard, MD  azelastine (ASTELIN) 0.1 % nasal spray Place 1 spray into both nostrils 2 (two) times daily. Use in each nostril as directed 09/07/21   Glendale Chard, MD  B-D UF III MINI PEN NEEDLES 31G X 5 MM MISC Use as directed 4x a day 12/13/21   Glendale Chard, MD  blood glucose meter kit and supplies Dispense based on patient and insurance preference. Use up to four times daily as directed.dx code e11.65 03/08/21   Glendale Chard, MD  Blood Glucose Monitoring Suppl (TRUE METRIX METER) DEVI Use to check blood sugars 3 times a day. 03/08/21   Glendale Chard, MD  Cholecalciferol (VITAMIN D3) 50 MCG (2000 UT) TABS Take 1 tablet by mouth daily at 12 noon.    [provider]  Ferrous Sulfate (IRON PO) Take 65 mg by mouth daily.    [provider]  furosemide (LASIX) 20 MG tablet Take 1 tablet (20 mg total) by mouth as needed (for swelling once daily). 03/07/22   Lenna Sciara, NP  Glucagon (GVOKE HYPOPEN 2-PACK) 0.5 MG/0.1ML SOAJ Inject 0.5 mg into the skin daily as needed. 08/02/21   Glendale Chard, MD  glucose blood (TRUE METRIX BLOOD GLUCOSE TEST) test strip Use as instructed to check blood sugar 3 times a day. Dx code e11.65 03/08/21   Glendale Chard, MD  hydrALAZINE (APRESOLINE) 50 MG tablet Take 1 tablet (50 mg total) by mouth 3 (three) times daily. 12/16/21   Glendale Chard, MD  hydrOXYzine (ATARAX) 25 MG tablet Take 1 tablet (25 mg total) by mouth 3 (three) times daily as needed for itching. 12/16/21   Glendale Chard, MD  insulin lispro (HUMALOG KWIKPEN) 200 UNIT/ML KwikPen inject 8-10 units into THE SKIN 15 minutes BEFORE THREE main meals of THE DAY 02/10/22   Philemon Kingdom, MD  Lancets (ACCU-CHEK SOFT [facility]) lancets Check blood sugars 3 times daily E11.65 02/12/18   Glendale Chard, MD  latanoprost (XALATAN) 0.005 % ophthalmic solution Place 1 drop into both eyes at bedtime.  03/17/19    [provider]  metoprolol succinate (TOPROL-XL) 25 MG 24 hr tablet Take 0.5 tablets (12.5 mg total) by mouth daily. 11/04/21   Troy Sine, MD  Multiple Vitamin (MULTIVITAMIN) tablet Take 1 tablet by mouth daily.    [provider]  Semaglutide, 1 MG/DOSE, (OZEMPIC, 1 MG/DOSE,) 2 MG/1.5ML SOPN Inject 1 mg into the skin once a week. 05/20/21   Philemon Kingdom, MD  TRESIBA FLEXTOUCH 200 UNIT/ML FlexTouch Pen inject 20 units into THE SKIN daily 03/20/22   Glendale Chard, MD    Family History Family History  Problem Relation Age of Onset   Hypertension Mother    Hypertension Father    Heart Problems Father    Hypertension Sister    Diabetes Sister    Hypertension Brother    Diabetes Brother    Breast cancer Neg Hx     Social History Social History   Tobacco Use   Smoking status: Never  Smokeless tobacco: Never  Vaping Use   Vaping Use: Never used  Substance Use Topics   Alcohol use: No   Drug use: Never     Allergies   Brimonidine, Dorzolamide, Latex, and Timolol   Review of Systems Review of Systems Per HPI  Physical Exam Triage Vital Signs ED Triage Vitals  Enc Vitals Group     BP 05/16/22 1755 (!) 182/66     Pulse Rate 05/16/22 1755 72     Resp 05/16/22 1755 20     Temp 05/16/22 1755 97.8 F (36.6 C)     Temp Source 05/16/22 1755 Oral     SpO2 05/16/22 1755 94 %     Weight --      Height --      Head Circumference --      Peak Flow --      Pain Score 05/16/22 1756 8     Pain Loc --      Pain Edu? --      Excl. in [place]? --    No data found.  Updated Vital Signs BP (!) 182/66 (BP Location: Right Arm)   Pulse 72   Temp 97.8 F (36.6 C) (Oral)   Resp 20   SpO2 94%   Visual Acuity Right Eye Distance:   Left Eye Distance:   Bilateral Distance:    Right Eye Near:   Left Eye Near:    Bilateral Near:     Physical Exam Vitals and nursing note reviewed.  Constitutional:      Appearance: Normal appearance. She is not  ill-appearing.  HENT:     Head: Atraumatic.  Eyes:     Extraocular Movements: Extraocular movements intact.     Conjunctiva/sclera: Conjunctivae normal.  Cardiovascular:     Rate and Rhythm: Normal rate and regular rhythm.     Heart sounds: Normal heart sounds.  Pulmonary:     Effort: Pulmonary effort is normal.     Breath sounds: Normal breath sounds.  Musculoskeletal:     Cervical back: Normal range of motion and neck supple.     Comments: Range of motion full to all 5 fingers of the right hand, unable to make a fist due to stiffness and pain in the hand.  Diffuse edema to dorsal right hand, minimal tenderness to palpation possibly some mild erythema to this area.  No bone deformities palpable  Skin:    General: Skin is warm and dry.  Neurological:     Mental Status: She is alert and oriented to person, place, and time.     Motor: No weakness.     Gait: Gait normal.     Comments: Right hand neurovascularly intact  Psychiatric:        Mood and Affect: Mood normal.        Thought Content: Thought content normal.        Judgment: Judgment normal.    UC Treatments / Results  Labs (all labs ordered are listed, but only abnormal results are displayed) Labs Reviewed  URIC ACID    EKG  Radiology DG Hand Complete Right  Result Date: 05/16/2022 CLINICAL DATA:  Right hand swelling and pain. EXAM: RIGHT HAND - COMPLETE 3+ VIEW COMPARISON:  None Available. FINDINGS: There is no evidence of fracture or dislocation.  Demineralization. Soft tissue swelling is noted. IMPRESSION: No evidence of acute fracture. Electronically Signed   By: Fidela Salisbury M.D.   On: 05/16/2022 18:37    Procedures Procedures (including critical  care time)  Medications Ordered in UC Medications - No data to display  Initial Impression / Assessment and Plan / UC Course  I have reviewed the triage vital signs and the nursing notes.  Pertinent labs & imaging results that were available during my care  of the patient were reviewed by me and considered in my medical decision making (see chart for details).     X-ray of the right hand negative for acute bony abnormalities, suspect inflammatory whether gouty, arthritic versus contusion type injury.  Unfortunately, given her comorbidities to include insulin-dependent diabetes and significant chronic kidney disease oral agents for inflammation are not the safest option.  Treat with Voltaren gel, Tylenol, ice, elevation and will send out for a uric acid to rule out gout.  Discussed supportive home care and return precautions.  Orthopedic information given in case not resolving.  Final Clinical Impressions(s) / UC Diagnoses   Final diagnoses:  Swelling of right hand  Right hand pain     Discharge Instructions      We have drawn a uric acid level today to see if this may be gout.  These results should be available tomorrow and someone should reach out if your results are abnormal.  The x-ray today was negative for any fractures.  Use Voltaren gel, ice, elevation, Tylenol and follow-up with orthopedics if not resolving over the next few days to a week.    ED Prescriptions   None    PDMP not reviewed this encounter.   Volney American, [place] 05/16/22 1910

## 2022-05-17 LAB — URIC ACID: Uric Acid: 6.7 mg/dL (ref 3.1–7.9)

## 2022-05-18 ENCOUNTER — Telehealth (INDEPENDENT_AMBULATORY_CARE_PROVIDER_SITE_OTHER): Payer: Medicare Other | Admitting: Nurse Practitioner

## 2022-05-18 ENCOUNTER — Telehealth: Payer: Self-pay

## 2022-05-18 DIAGNOSIS — M10341 Gout due to renal impairment, right hand: Secondary | ICD-10-CM | POA: Diagnosis not present

## 2022-05-18 DIAGNOSIS — E11319 Type 2 diabetes mellitus with unspecified diabetic retinopathy without macular edema: Secondary | ICD-10-CM

## 2022-05-18 MED ORDER — PREDNISONE 20 MG PO TABS: ORAL_TABLET | ORAL | 0 refills | Status: DC

## 2022-05-18 NOTE — Telephone Encounter (Signed)
Transition Care Management Follow-up Telephone Call Date of discharge and from where: 05/16/22 How have you been since you were released from the hospital? Hand is still swollen and painful, on top of hand toward fingers Any questions or concerns? No  Items Reviewed: Did the pt receive and understand the discharge instructions provided? No  Medications obtained and verified? Yes  Other? No  Any new allergies since your discharge? No  Dietary orders reviewed? No Do you have support at home? Yes   Home Care and Equipment/Supplies: Were home health services ordered? not applicable If so, what is the name of the agency? N/a  Has the agency set up a time to come to the patient's home? not applicable Were any new equipment or medical supplies ordered?  No What is the name of the medical supply agency? N/a Were you able to get the supplies/equipment? not applicable Do you have any questions related to the use of the equipment or supplies? No  Functional Questionnaire: (I = Independent and D = Dependent) ADLs: D  Bathing/Dressing- D  Meal Prep- D  Eating- I  Maintaining continence- I  Transferring/Ambulation- D  Managing Meds- I  Follow up appointments reviewed:  PCP Hospital f/u appt confirmed? Yes  Scheduled to see [name], NP on 05/18/22 @ [facility] f/u appt confirmed? No  Scheduled to see N/A on / @ N/A. Are transportation arrangements needed? No  If their condition worsens, is the pt aware to call PCP or go to the Emergency Dept.? Yes Was the patient provided with contact information for the PCP's office or ED? Yes Was to pt encouraged to call back with questions or concerns? Yes

## 2022-05-18 NOTE — Progress Notes (Signed)
Virtual Visit via MyChart   This visit type was conducted due to national recommendations for restrictions regarding the COVID-19 Pandemic (e.g. social distancing) in an effort to limit this patient's exposure and mitigate transmission in our community.  Due to her co-morbid illnesses, this patient is at least at moderate risk for complications without adequate follow up.  This format is felt to be most appropriate for this patient at this time.  All issues noted in this document were discussed and addressed.  A limited physical exam was performed with this format.    This visit type was conducted due to national recommendations for restrictions regarding the COVID-19 Pandemic (e.g. social distancing) in an effort to limit this patient's exposure and mitigate transmission in our community.  Patients identity confirmed using two different identifiers.  This format is felt to be most appropriate for this patient at this time.  All issues noted in this document were discussed and addressed.  No physical exam was performed (except for noted visual exam findings with Video Visits).    Date:  05/29/2022   ID:  Jamyriah, Wambach 23-Apr-1940, MRN [id number]  Patient Location:  Home - spoke with [name] and daughter  Provider location:   Office    Chief Complaint:  right hand swelling related to gout  History of Present Illness:    SELIA MCELMURRY is a 82 y.o. female who presents via video conferencing for a telehealth visit today.    The patient does not have symptoms concerning for COVID-19 infection (fever, chills, cough, or new shortness of breath).   She ate some shrimp on Friday night, she has had extra strength tylenol - 4 total today.      Past Medical History:  Diagnosis Date   Diabetes mellitus ([place])    H/O: CVA (cerebrovascular accident) 2002   HTN (hypertension)    Hyperlipidemia    Pulmonary HTN ([place])    mild by echo   Past Surgical History:  Procedure  Laterality Date   ABDOMINAL HYSTERECTOMY     CARDIAC CATHETERIZATION  03/17/2003   Normal, patent coronary arteries. Normal LV systolic function   CARDIOVASCULAR STRESS TEST  08/28/2006   Small sized, moderate ischemiain the Basal Inferoseptal, Basal Inferior, Mid Inferior and Apical Inferior regions   TRANSTHORACIC ECHOCARDIOGRAM  02/17/2010   EF >55%, moderate-severely thickened septum, mild-moderate mitral annular calcification     Current Meds  Medication Sig   predniSONE (DELTASONE) 20 MG tablet Take 2 tablets by mouth daily x 5 days     Allergies:   Brimonidine, Dorzolamide, Latex, and Timolol   Social History   Tobacco Use   Smoking status: Never   Smokeless tobacco: Never  Vaping Use   Vaping Use: Never used  Substance Use Topics   Alcohol use: No   Drug use: Never     Family Hx: The patient's family history includes Diabetes in her brother and sister; Heart Problems in her father; Hypertension in her brother, father, mother, and sister. There is no history of Breast cancer.  ROS:   Please see the history of present illness.    ROS  All other systems reviewed and are negative.   Labs/Other Tests and Data Reviewed:    Recent Labs: 11/23/2021: B Natriuretic Peptide 330.4 12/02/2021: Magnesium 2.8 03/22/2022: TSH 2.720 03/29/2022: ALT 11; BUN 44; Creatinine, Ser 4.66; Hemoglobin 10.0; Platelets 349; Potassium 3.2; Sodium 136   Recent Lipid Panel Lab Results  Component Value Date/Time   CHOL  314 (H) 03/22/2022 09:48 AM   TRIG 300 (H) 03/22/2022 09:48 AM   HDL 50 03/22/2022 09:48 AM   CHOLHDL 6.3 (H) 03/22/2022 09:48 AM   CHOLHDL 4 10/09/2018 09:05 AM   LDLCALC 204 (H) 03/22/2022 09:48 AM    Wt Readings from Last 3 Encounters:  03/29/22 160 lb (72.6 kg)  03/22/22 150 lb 6.4 oz (68.2 kg)  01/17/22 166 lb (75.3 kg)     Exam:    Vital Signs:  There were no vitals taken for this visit.    Physical Exam Vitals reviewed.  Constitutional:      General:  She is not in acute distress.    Appearance: Normal appearance.  Pulmonary:     Effort: Pulmonary effort is normal. No respiratory distress.  Musculoskeletal:        General: Swelling (right hand swelling) present.  Neurological:     General: No focal deficit present.     Mental Status: She is alert and oriented to person, place, and time. Mental status is at baseline.     Cranial Nerves: No cranial nerve deficit.  Psychiatric:        Mood and Affect: Mood and affect normal.        Behavior: Behavior normal.        Thought Content: Thought content normal.        Cognition and Memory: Memory normal.        Judgment: Judgment normal.     ASSESSMENT & PLAN:    1. Acute gout due to renal impairment involving right hand Will treat with short course of prednisone. She does have swelling to right hand. Also encouraged to avoid eating shellfish. Elevated hand when possible - predniSONE (DELTASONE) 20 MG tablet; Take 2 tablets by mouth daily x 5 days  Dispense: 10 tablet; Refill: 0  2. Diabetic retinopathy associated with type 2 diabetes mellitus, macular edema presence unspecified, unspecified laterality, unspecified retinopathy severity ([place]) She is to monitor her blood sugar, if greater than 200 call to office.    COVID-19 Education: The signs and symptoms of COVID-19 were discussed with the patient and how to seek care for testing (follow up with PCP or arrange E-visit).  The importance of social distancing was discussed today.  Patient Risk:   After full review of this patients clinical status, I feel that they are at least moderate risk at this time.  Time:   Today, I have spent 15.55 minutes/ seconds with the patient with telehealth technology discussing above diagnoses.     Medication Adjustments/Labs and Tests Ordered: Current medicines are reviewed at length with the patient today.  Concerns regarding medicines are outlined above.   Tests Ordered: No orders of the defined  types were placed in this encounter.   Medication Changes: Meds ordered this encounter  Medications   predniSONE (DELTASONE) 20 MG tablet    Sig: Take 2 tablets by mouth daily x 5 days    Dispense:  10 tablet    Refill:  0    Disposition:  Follow up prn  Signed, [name], FNP

## 2022-05-18 NOTE — Telephone Encounter (Signed)
Patient's daughter would like to inquire about [facility] referral for PT. Per referral [facility] did not have available staff at the time of the referral.   Please advise, thank you!

## 2022-05-29 ENCOUNTER — Encounter: Payer: Self-pay | Admitting: Nurse Practitioner

## 2022-05-30 ENCOUNTER — Telehealth: Payer: Self-pay

## 2022-05-30 ENCOUNTER — Other Ambulatory Visit: Payer: Self-pay | Admitting: *Deleted

## 2022-05-30 ENCOUNTER — Ambulatory Visit (HOSPITAL_BASED_OUTPATIENT_CLINIC_OR_DEPARTMENT_OTHER): Payer: Medicare Other | Admitting: Cardiology

## 2022-05-30 ENCOUNTER — Encounter (HOSPITAL_BASED_OUTPATIENT_CLINIC_OR_DEPARTMENT_OTHER): Payer: Self-pay | Admitting: Cardiology

## 2022-05-30 VITALS — BP 138/64 | HR 68 | Ht 62.0 in | Wt 147.0 lb

## 2022-05-30 DIAGNOSIS — N184 Chronic kidney disease, stage 4 (severe): Secondary | ICD-10-CM

## 2022-05-30 DIAGNOSIS — Z79899 Other long term (current) drug therapy: Secondary | ICD-10-CM

## 2022-05-30 DIAGNOSIS — E785 Hyperlipidemia, unspecified: Secondary | ICD-10-CM | POA: Diagnosis not present

## 2022-05-30 DIAGNOSIS — R5381 Other malaise: Secondary | ICD-10-CM | POA: Diagnosis not present

## 2022-05-30 DIAGNOSIS — I5032 Chronic diastolic (congestive) heart failure: Secondary | ICD-10-CM

## 2022-05-30 DIAGNOSIS — Z87898 Personal history of other specified conditions: Secondary | ICD-10-CM

## 2022-05-30 DIAGNOSIS — I10 Essential (primary) hypertension: Secondary | ICD-10-CM | POA: Diagnosis not present

## 2022-05-30 DIAGNOSIS — I480 Paroxysmal atrial fibrillation: Secondary | ICD-10-CM | POA: Diagnosis not present

## 2022-05-30 MED ORDER — APIXABAN 2.5 MG PO TABS: 2.5000 mg | ORAL_TABLET | Freq: Two times a day (BID) | ORAL | 2 refills | Status: DC

## 2022-05-30 NOTE — Telephone Encounter (Signed)
Sarah, Thanks for the udpate! She can go ahead and stop the Aspirin to reduce bleeding risk. We can discuss in clinic today. Of note, you can use dotphrase 'hcmonitoralert' for critical events from monitors. It gives you helpful info on what meds they might already be on (like aspirin, eliquis, etc).   Loel Dubonnet, NP

## 2022-05-30 NOTE — Telephone Encounter (Signed)
[name] informed that patient is to sop all aspirin products. She verbalized understanding.

## 2022-05-30 NOTE — Progress Notes (Signed)
. Cardiology Office Note:    Date:  05/30/2022   ID:  [name], [name] [birth year], MRN [id number]  PCP:  Glendale Chard, [place] Providers Cardiologist:  Shelva Majestic, MD     Referring MD: Glendale Chard, MD   Chief Complaint: Atrial fibrillation on monitor  History of Present Illness:    [name] [name] is a 82 y.o. female with a hx of chronic diastolic heart failure, hypertension, hyperlipidemia, DM2, CKD IV, diabetic polyneuropathy, CVA, syncope, atrial fibrillation.  Last seen on 12/22/21 by [name] Browner, NP.  History of remote CVA in 2002.  Carotid catheterization 2004 with normal coronary arteries.  Stress test May 2018 negative for ischemia.  Echo 2011 asymmetric LVH, grade 1 diastolic dysfunction, mild/moderate MAC.  Reestablished care with Dr. Claiborne Billings December 2022.  Echo 03/2021 EF 65 to [id number], grade 1 diastolic dysfunction, moderate LVH, severe MAC, no aortic stenosis.    Admitted May 2023 with acute on chronic diastolic heart failure.  Amlodipine previously decreased to 10 mg to 5 mg due to lower extremity edema and Lasix increased to 20 mg daily.  For optimal blood pressure control hydralazine previously increased to 50 mg twice daily.    Hospitalized 11/2021 following syncopal episode.  On admission noted slurred speech, tremor, stumbling.  CT head negative.  EEG, carotid Dopplers unremarkable.  Repeat echo LVEF 60 to 65%, no RWMA, mild LVH, indeterminate diastolic parameters, RV normal systolic function, mild to moderate MS, moderate to severe MAC-overall unchanged from previous.  MRI brain negative for acute abnormality.  Neurology consulted.  It was suspected syncope related to dehydration and orthostatic hypotension.  She saw [name] Browner, NP 12/22/21 and was recommended for 30-day event monitor.  However monitor was not placed until 04/2022.    Office received critical event monitor notification revealing atrial fibrillation on 05/27/2022 around 5 PM.  No  symptoms noted.  She had missed a dose of metoprolol succinate over the weekend.  She was started on Eliquis 2.5 mg twice daily and recommended to continue metoprolol.  She was scheduled for office visit today and presents accompanied by her 2 daughters.  Twelve-lead obtained in the office revealed SR. She endorses that she has felt well and did not notice any palpitations, SOB, worsening fatigue. She denies chest pain, palpitations, dyspnea, pnd, orthopnea, n, v, dizziness, syncope, weight gain, or early satiety.   Her daughters endorse that since her fall in December, she has continued to worsen with her functionality in the home. She is no longer able to cook, clean etc., she is able to get to the restroom with a walker, dtrs state this is a vast change from her prior to the fall. After d/c, the [facility] agency called to offer services and [name] [name] declined, since then, her family has had difficulty in obtaining PT/OT help.   Past Medical History:  Diagnosis Date   Diabetes mellitus ([place])    H/O: CVA (cerebrovascular accident) 2002   HTN (hypertension)    Hyperlipidemia    Pulmonary HTN ([place])    mild by echo    Past Surgical History:  Procedure Laterality Date   ABDOMINAL HYSTERECTOMY     CARDIAC CATHETERIZATION  03/17/2003   Normal, patent coronary arteries. Normal LV systolic function   CARDIOVASCULAR STRESS TEST  08/28/2006   Small sized, moderate ischemiain the Basal Inferoseptal, Basal Inferior, Mid Inferior and Apical Inferior regions   TRANSTHORACIC ECHOCARDIOGRAM  02/17/2010   EF >55%, moderate-severely thickened septum, mild-moderate  mitral annular calcification    Current Medications: Current Meds  Medication Sig   ACCU-CHEK AVIVA PLUS test strip TEST THREE TIMES DAILY AS DIRECTED   amLODipine (NORVASC) 5 MG tablet Take 1 tablet (5 mg total) by mouth daily.   atorvastatin (LIPITOR) 20 MG tablet Take 1 tablet (20 mg total) by mouth daily.   azelastine (ASTELIN) 0.1 % nasal  spray Place 1 spray into both nostrils 2 (two) times daily. Use in each nostril as directed   B-D UF III MINI PEN NEEDLES 31G X 5 MM MISC Use as directed 4x a day   blood glucose meter kit and supplies Dispense based on patient and insurance preference. Use up to four times daily as directed.dx code e11.65   Blood Glucose Monitoring Suppl (TRUE METRIX METER) DEVI Use to check blood sugars 3 times a day.   Cholecalciferol (VITAMIN D3) 50 MCG (2000 UT) TABS Take 1 tablet by mouth daily at 12 noon.   Ferrous Sulfate (IRON PO) Take 65 mg by mouth daily.   furosemide (LASIX) 20 MG tablet Take 1 tablet (20 mg total) by mouth as needed (for swelling once daily).   glucose blood (TRUE METRIX BLOOD GLUCOSE TEST) test strip Use as instructed to check blood sugar 3 times a day. Dx code e11.65   hydrALAZINE (APRESOLINE) 50 MG tablet Take 1 tablet (50 mg total) by mouth 3 (three) times daily.   hydrOXYzine (ATARAX) 25 MG tablet Take 1 tablet (25 mg total) by mouth 3 (three) times daily as needed for itching.   insulin lispro (HUMALOG KWIKPEN) 200 UNIT/ML KwikPen inject 8-10 units into THE SKIN 15 minutes BEFORE THREE main meals of THE DAY   Lancets (ACCU-CHEK SOFT TOUCH) lancets Check blood sugars 3 times daily E11.65   latanoprost (XALATAN) 0.005 % ophthalmic solution Place 1 drop into both eyes at bedtime.    metoprolol succinate (TOPROL-XL) 25 MG 24 hr tablet Take 0.5 tablets (12.5 mg total) by mouth daily.   Multiple Vitamin (MULTIVITAMIN) tablet Take 1 tablet by mouth daily.   Semaglutide, 1 MG/DOSE, (OZEMPIC, 1 MG/DOSE,) 2 MG/1.5ML SOPN Inject 1 mg into the skin once a week.   TRESIBA FLEXTOUCH 200 UNIT/ML FlexTouch Pen inject 20 units into THE SKIN daily     Allergies:   Brimonidine, Dorzolamide, Latex, and Timolol   Social History   Socioeconomic History   Marital status: Married    Spouse name: Not on file   Number of children: Not on file   Years of education: Not on file   Highest education  level: Not on file  Occupational History   Occupation: retired  Tobacco Use   Smoking status: Never   Smokeless tobacco: Never  Vaping Use   Vaping Use: Never used  Substance and Sexual Activity   Alcohol use: No   Drug use: Never   Sexual activity: Not Currently  Other Topics Concern   Not on file  Social History Narrative   Not on file   Social Determinants of Health   Financial Resource Strain: Low Risk  (10/19/2021)   Overall Financial Resource Strain (CARDIA)    Difficulty of Paying Living Expenses: Not hard at all  Food Insecurity: No Food Insecurity (10/19/2021)   Hunger Vital Sign    Worried About Running Out of Food in the Last Year: Never true    [place] in the Last Year: Never true  Transportation Needs: No Transportation Needs (10/19/2021)   [place] - Transportation  Lack of Transportation (Medical): No    Lack of Transportation (Non-Medical): No  Physical Activity: Inactive (10/19/2021)   Exercise Vital Sign    Days of Exercise per Week: 0 days    Minutes of Exercise per Session: 0 min  Stress: No Stress Concern Present (10/19/2021)   [place]    Feeling of Stress : Not at all  Social Connections: Not on file     Family History: The patient's family history includes Diabetes in her brother and sister; Heart Problems in her father; Hypertension in her brother, father, mother, and sister. There is no history of Breast cancer.  ROS:   Please see the history of present illness.    All other systems reviewed and are negative.  EKGs/Labs/Other Studies Reviewed:    The following studies were reviewed today:   EKG:  EKG is  ordered today.  The ekg ordered today demonstrates SR with sinus arrhythmia, HR 68 bpm  Recent Labs: 11/23/2021: B Natriuretic Peptide 330.4 12/02/2021: Magnesium 2.8 03/22/2022: TSH 2.720 03/29/2022: ALT 11; BUN 44; Creatinine, Ser 4.66; Hemoglobin 10.0; Platelets  349; Potassium 3.2; Sodium 136  Recent Lipid Panel    Component Value Date/Time   CHOL 314 (H) 03/22/2022 0948   TRIG 300 (H) 03/22/2022 0948   HDL 50 03/22/2022 0948   CHOLHDL 6.3 (H) 03/22/2022 0948   CHOLHDL 4 10/09/2018 0905   VLDL 37.8 10/09/2018 0905   LDLCALC 204 (H) 03/22/2022 0948     Risk Assessment/Calculations:    CHA2DS2-VASc Score = 6   This indicates a 9.7% annual risk of stroke. The patient's score is based upon: CHF History: 1 HTN History: 1 Diabetes History: 1 Stroke History: 0 Vascular Disease History: 0 Age Score: 2 Gender Score: 1               Physical Exam:    VS:  BP 138/64   Pulse 68   Ht 5' 2"$  (1.575 m)   Wt 147 lb (66.7 kg)   BMI 26.89 kg/m     Wt Readings from Last 3 Encounters:  05/30/22 147 lb (66.7 kg)  03/29/22 160 lb (72.6 kg)  03/22/22 150 lb 6.4 oz (68.2 kg)     GEN:  Well nourished, well developed in no acute distress HEENT: Normal NECK: No JVD; No carotid bruits LYMPHATICS: No lymphadenopathy CARDIAC: RRR, no murmurs, rubs, gallops RESPIRATORY:  Clear to auscultation without rales, wheezing or rhonchi  ABDOMEN: Soft, non-tender, non-distended MUSCULOSKELETAL: trace ankle edema; No deformity  SKIN: Warm and dry NEUROLOGIC:  Alert and oriented x 3 PSYCHIATRIC:  Normal affect   ASSESSMENT:    1. Paroxysmal atrial fibrillation (HCC)   2. Chronic diastolic heart failure ([place])   3. Hyperlipidemia LDL goal <70   4. Essential hypertension   5. History of syncope   6. CKD (chronic kidney disease) stage 4, GFR 15-29 ml/min (HCC)   7. Physical deconditioning    PLAN:    In order of problems listed above:  History of syncope -she has not had any further syncopal episodes, an event monitor was ordered, which she is currently still wearing. Carotid duplex 11/2021 showed no stenosis. Echo 11/2021 normal LVEF, mild to moderate MS.   Paroxysmal atrial fibrillation - event monitor was ordered, our office received a critical  event notification on 2/17 that she was in atrial fibrillation, however multiple episodes were noted by monitor.  No symptoms were associated.  In office EKG today  she has reverted to sinus rhythm.  CHA2DS2-VASc Score = 6 [CHF History: 1, HTN History: 1, Diabetes History: 1, Stroke History: 0, Vascular Disease History: 0, Age Score: 2, Gender Score: 1].  Therefore, the patient's annual risk of stroke is 9.7 %.  Start Eliquis 2.5 mg twice a day (dose reduction for age, creatinine).  Will check CBC, BMET, TSH, magnesium.  Continue metoprolol.  Chronic diastolic heart failure-echo on 12/02/2021 showed EF 60 to 65%, mild LVH, NYHA class I-II, she has ongoing fatigue, it is not clear if this is due to deconditioning, she is euvolemic with trace pedal edema.  Ordered home health as below. Continue metoprolol.  Hypertension-blood pressure today 138/64, continue current antihypertensive regimen. Encouraged to monitor at home. If persistently elevated, could consider increasing her hydralazine.   Hyperlipidemia-LDL on 03/31/2022 204, she is on atorvastatin 20 mg daily, was unable to address adherence to statin at this visit, it is not clear if her PCP made any adjustments to her statin regimen and her records are unavailable for review at this time.  DM2-managed closely by her PCP  CKD IV- Careful titration of diuretic and antihypertensive.    History of CVA-continue statin, no aspirin due to anticoagulation.  9.   Deconditioning -Per family she is not at her baseline from a functionality standpoint since her fall in December, she is no longer able to cook or clean.  Apparently after her discharge the home health agency called to arrange visits and the patient denied need.  The family has had difficulty in reaching an agency to assist.  Will place an ambulatory referral for PT/OT.   Disposition: Will check CBC, be met, TSH, magnesium.  Will start Eliquis 2.5 mg twice a day.  Ambulatory referral for PT/OT.   Return in 3 to 4 weeks.            Medication Adjustments/Labs and Tests Ordered: Current medicines are reviewed at length with the patient today.  Concerns regarding medicines are outlined above.  Orders Placed This Encounter  Procedures   Ambulatory referral to [facility]   EKG 12-Lead   No orders of the defined types were placed in this encounter.   Patient Instructions  Medication Instructions:  Your physician recommends that you continue on your current medications as directed. Please refer to the Current Medication list given to you today.  *If you need a refill on your cardiac medications before your next appointment, please call your pharmacy*   Follow-Up: At [facility], you and your health needs are our priority.  As part of our continuing mission to provide you with exceptional heart care, we have created designated Provider Care Teams.  These Care Teams include your primary Cardiologist (physician) and Advanced Practice Providers (APPs -  Physician Assistants and Nurse Practitioners) who all work together to provide you with the care you need, when you need it.  We recommend signing up for the patient portal called "MyChart".  Sign up information is provided on this After Visit Summary.  MyChart is used to connect with patients for Virtual Visits (Telemedicine).  Patients are able to view lab/test results, encounter notes, upcoming appointments, etc.  Non-urgent messages can be sent to your provider as well.   To learn more about what you can do with MyChart, go to [email or web address].    Your next appointment:   3-4 week(s)  Provider:   Laurann Montana, NP    Other Instructions We have referred you to Home Health Today for  PT & OT     Signed, Trudi Ida, NP  05/30/2022 5:06 PM    [place]

## 2022-05-30 NOTE — Patient Instructions (Signed)
Medication Instructions:  Your physician recommends that you continue on your current medications as directed. Please refer to the Current Medication list given to you today.  *If you need a refill on your cardiac medications before your next appointment, please call your pharmacy*   Follow-Up: At [facility], you and your health needs are our priority.  As part of our continuing mission to provide you with exceptional heart care, we have created designated Provider Care Teams.  These Care Teams include your primary Cardiologist (physician) and Advanced Practice Providers (APPs -  Physician Assistants and Nurse Practitioners) who all work together to provide you with the care you need, when you need it.  We recommend signing up for the patient portal called "MyChart".  Sign up information is provided on this After Visit Summary.  MyChart is used to connect with patients for Virtual Visits (Telemedicine).  Patients are able to view lab/test results, encounter notes, upcoming appointments, etc.  Non-urgent messages can be sent to your provider as well.   To learn more about what you can do with MyChart, go to [email or web address].    Your next appointment:   3-4 week(s)  Provider:   Laurann Montana, NP    Other Instructions We have referred you to [facility] Today for PT & OT

## 2022-05-30 NOTE — Progress Notes (Cosign Needed Addendum)
Called patient to discuss adherence packaging service with [facility]. Spoke with daughter [name], she declined packaging services, she stated patients medications change all the time and the have pill boxes they use to arrange her medications. Daughter also requested medication delivery for today, Cardiologist sent in Eliquis to [facility] today. Daughter aware I will request this medication to be sent today and Pharmacy will contact her if they are unable to or when they will bring out.   Pattricia Boss, [place] Pharmacist Assistant [phone]

## 2022-05-30 NOTE — Telephone Encounter (Signed)
Spoke with [name] and pt regarding critical even monitor that showed Afib on 2/17 around 5 PM. Patient did not recall having any symptoms at that time or now (SOB, chest pain, dizziness, lightheadedness). [name] [name] stated patient missed met succinate 12.823m over the weekend. She gave her 325 ASA instead of 87m Educations on medications. Spoke with E. Monge and showed her monitor results. Order for Eliquis 2.23m64mwice daily. Continue with met succ 12.5 mg daily, CBC, BMET, TSH, MG, and APP ASAP. Called [name] back to really orders. Appointment with C. Vella Raring 1:55 PM today. Lab and eliquis orders placed.

## 2022-05-31 ENCOUNTER — Encounter (HOSPITAL_BASED_OUTPATIENT_CLINIC_OR_DEPARTMENT_OTHER): Payer: Self-pay | Admitting: Cardiology

## 2022-05-31 DIAGNOSIS — M6281 Muscle weakness (generalized): Secondary | ICD-10-CM | POA: Diagnosis not present

## 2022-05-31 DIAGNOSIS — R262 Difficulty in walking, not elsewhere classified: Secondary | ICD-10-CM | POA: Diagnosis not present

## 2022-05-31 DIAGNOSIS — I10 Essential (primary) hypertension: Secondary | ICD-10-CM | POA: Diagnosis not present

## 2022-05-31 LAB — BASIC METABOLIC PANEL
BUN/Creatinine Ratio: 12 (ref 12–28)
BUN: 56 mg/dL — ABNORMAL HIGH (ref 8–27)
CO2: 18 mmol/L — ABNORMAL LOW (ref 20–29)
Calcium: 8.8 mg/dL (ref 8.7–10.3)
Chloride: 103 mmol/L (ref 96–106)
Creatinine, Ser: 4.65 mg/dL — ABNORMAL HIGH (ref 0.57–1.00)
Glucose: 248 mg/dL — ABNORMAL HIGH (ref 70–99)
Potassium: 4 mmol/L (ref 3.5–5.2)
Sodium: 142 mmol/L (ref 134–144)
eGFR: 9 mL/min/{1.73_m2} — ABNORMAL LOW (ref >59)

## 2022-05-31 LAB — MAGNESIUM: Magnesium: 2 mg/dL (ref 1.6–2.3)

## 2022-05-31 LAB — CBC
Hematocrit: 28.3 % — ABNORMAL LOW (ref 34.0–46.6)
Hemoglobin: 9.1 g/dL — ABNORMAL LOW (ref 11.1–15.9)
MCH: 29.4 pg (ref 26.6–33.0)
MCHC: 32.2 g/dL (ref 31.5–35.7)
MCV: 91 fL (ref 79–97)
Platelets: 357 10*3/uL (ref 150–450)
RBC: 3.1 x10E6/uL — ABNORMAL LOW (ref 3.77–5.28)
RDW: 12.7 % (ref 11.7–15.4)
WBC: 10.5 10*3/uL (ref 3.4–10.8)

## 2022-05-31 LAB — TSH: TSH: 2.23 u[IU]/mL (ref 0.450–4.500)

## 2022-06-01 DIAGNOSIS — I10 Essential (primary) hypertension: Secondary | ICD-10-CM | POA: Diagnosis not present

## 2022-06-01 DIAGNOSIS — M6281 Muscle weakness (generalized): Secondary | ICD-10-CM | POA: Diagnosis not present

## 2022-06-01 DIAGNOSIS — R262 Difficulty in walking, not elsewhere classified: Secondary | ICD-10-CM | POA: Diagnosis not present

## 2022-06-02 ENCOUNTER — Telehealth: Payer: Self-pay

## 2022-06-02 ENCOUNTER — Emergency Department (HOSPITAL_COMMUNITY): Payer: Medicare Other

## 2022-06-02 ENCOUNTER — Other Ambulatory Visit: Payer: Self-pay

## 2022-06-02 ENCOUNTER — Encounter: Payer: Self-pay | Admitting: Internal Medicine

## 2022-06-02 ENCOUNTER — Encounter (HOSPITAL_COMMUNITY): Payer: Self-pay

## 2022-06-02 ENCOUNTER — Emergency Department (HOSPITAL_COMMUNITY)
Admission: EM | Admit: 2022-06-02 | Discharge: 2022-06-02 | Disposition: A | Discharge status: Home / Self Care | Payer: Medicare Other | Attending: Emergency Medicine | Admitting: Emergency Medicine

## 2022-06-02 DIAGNOSIS — I130 Hypertensive heart and chronic kidney disease with heart failure and stage 1 through stage 4 chronic kidney disease, or unspecified chronic kidney disease: Secondary | ICD-10-CM | POA: Diagnosis not present

## 2022-06-02 DIAGNOSIS — E11319 Type 2 diabetes mellitus with unspecified diabetic retinopathy without macular edema: Secondary | ICD-10-CM | POA: Diagnosis not present

## 2022-06-02 DIAGNOSIS — R072 Precordial pain: Secondary | ICD-10-CM | POA: Insufficient documentation

## 2022-06-02 DIAGNOSIS — R0781 Pleurodynia: Secondary | ICD-10-CM | POA: Diagnosis not present

## 2022-06-02 DIAGNOSIS — N1832 Chronic kidney disease, stage 3b: Secondary | ICD-10-CM | POA: Insufficient documentation

## 2022-06-02 DIAGNOSIS — R0789 Other chest pain: Secondary | ICD-10-CM | POA: Diagnosis not present

## 2022-06-02 DIAGNOSIS — R079 Chest pain, unspecified: Secondary | ICD-10-CM | POA: Diagnosis present

## 2022-06-02 DIAGNOSIS — E1159 Type 2 diabetes mellitus with other circulatory complications: Secondary | ICD-10-CM | POA: Insufficient documentation

## 2022-06-02 DIAGNOSIS — Z20822 Contact with and (suspected) exposure to covid-19: Secondary | ICD-10-CM | POA: Diagnosis not present

## 2022-06-02 DIAGNOSIS — I5032 Chronic diastolic (congestive) heart failure: Secondary | ICD-10-CM | POA: Insufficient documentation

## 2022-06-02 DIAGNOSIS — D631 Anemia in chronic kidney disease: Secondary | ICD-10-CM | POA: Insufficient documentation

## 2022-06-02 HISTORY — DX: Type 2 diabetes mellitus with hyperglycemia: E11.59

## 2022-06-02 HISTORY — DX: Essential (primary) hypertension: I10

## 2022-06-02 HISTORY — DX: Type 2 diabetes mellitus with hyperglycemia: E11.65

## 2022-06-02 LAB — LIPASE, BLOOD: Lipase: 29 U/L (ref 11–51)

## 2022-06-02 LAB — CBC WITH DIFFERENTIAL/PLATELET
Abs Immature Granulocytes: 0.06 10*3/uL (ref 0.00–0.07)
Basophils Absolute: 0.1 10*3/uL (ref 0.0–0.1)
Basophils Relative: 1 %
Eosinophils Absolute: 0.4 10*3/uL (ref 0.0–0.5)
Eosinophils Relative: 3 %
HCT: 26 % — ABNORMAL LOW (ref 36.0–46.0)
Hemoglobin: 8.6 g/dL — ABNORMAL LOW (ref 12.0–15.0)
Immature Granulocytes: 1 %
Lymphocytes Relative: 15 %
Lymphs Abs: 1.6 10*3/uL (ref 0.7–4.0)
MCH: 30 pg (ref 26.0–34.0)
MCHC: 33.1 g/dL (ref 30.0–36.0)
MCV: 90.6 fL (ref 80.0–100.0)
Monocytes Absolute: 1.2 10*3/uL — ABNORMAL HIGH (ref 0.1–1.0)
Monocytes Relative: 11 %
Neutro Abs: 7.6 10*3/uL (ref 1.7–7.7)
Neutrophils Relative %: 69 %
Platelets: 349 10*3/uL (ref 150–400)
RBC: 2.87 MIL/uL — ABNORMAL LOW (ref 3.87–5.11)
RDW: 12.9 % (ref 11.5–15.5)
WBC: 10.9 10*3/uL — ABNORMAL HIGH (ref 4.0–10.5)
nRBC: 0 % (ref 0.0–0.2)

## 2022-06-02 LAB — COMPREHENSIVE METABOLIC PANEL
ALT: 11 U/L (ref 0–44)
AST: 14 U/L — ABNORMAL LOW (ref 15–41)
Albumin: 2.3 g/dL — ABNORMAL LOW (ref 3.5–5.0)
Alkaline Phosphatase: 95 U/L (ref 38–126)
Anion gap: 14 (ref 5–15)
BUN: 62 mg/dL — ABNORMAL HIGH (ref 8–23)
CO2: 19 mmol/L — ABNORMAL LOW (ref 22–32)
Calcium: 8.3 mg/dL — ABNORMAL LOW (ref 8.9–10.3)
Chloride: 106 mmol/L (ref 98–111)
Creatinine, Ser: 5.37 mg/dL — ABNORMAL HIGH (ref 0.44–1.00)
GFR, Estimated: 7 mL/min — ABNORMAL LOW (ref >60)
Glucose, Bld: 222 mg/dL — ABNORMAL HIGH (ref 70–99)
Potassium: 3.5 mmol/L (ref 3.5–5.1)
Sodium: 139 mmol/L (ref 135–145)
Total Bilirubin: 0.4 mg/dL (ref 0.3–1.2)
Total Protein: 5.8 g/dL — ABNORMAL LOW (ref 6.5–8.1)

## 2022-06-02 LAB — TROPONIN I (HIGH SENSITIVITY)
Troponin I (High Sensitivity): 41 ng/L — ABNORMAL HIGH (ref <18)
Troponin I (High Sensitivity): 41 ng/L — ABNORMAL HIGH (ref <18)

## 2022-06-02 LAB — RESP PANEL BY RT-PCR (RSV, FLU A&B, COVID)  RVPGX2
Influenza A by PCR: NEGATIVE
Influenza B by PCR: NEGATIVE
Resp Syncytial Virus by PCR: NEGATIVE
SARS Coronavirus 2 by RT PCR: NEGATIVE

## 2022-06-02 NOTE — Transitions of Care (Post Inpatient/ED Visit) (Signed)
   06/02/2022  Name: [name] [name] MRN: [id number] DOB: [birth year]  Today's TOC FU Call Status: Today's TOC FU Call Status:: Successful TOC FU Call Competed TOC FU Call Complete Date: 06/02/22  Transition Care Management Follow-up Telephone Call Date of Discharge: 06/02/22 Discharge [facility] Type of Discharge: Inpatient Admission Primary Inpatient Discharge Diagnosis:: Precordial chest pain How have you been since you were released from the hospital?: Better Any questions or concerns?: No  Items Reviewed: Did you receive and understand the discharge instructions provided?: Yes Medications obtained and verified?: Yes (Medications Reviewed) Any new allergies since your discharge?: No Dietary orders reviewed?: No Do you have support at home?: Yes People in Home: child(ren), adult, spouse  Home Care and Equipment/Supplies: [place] Ordered?: No Any new equipment or medical supplies ordered?: No  Functional Questionnaire: Do you need assistance with bathing/showering or dressing?: Yes Do you need assistance with meal preparation?: Yes Do you need assistance with eating?: Yes Do you have difficulty maintaining continence: Yes Do you need assistance with getting out of bed/getting out of a chair/moving?: Yes Do you have difficulty managing or taking your medications?: No  Folllow up appointments reviewed: PCP Follow-up appointment confirmed?: No (not yet sch wants to talk more with pt- via [name]) MD Provider Line [phone] Given: Yes [facility] Follow-up appointment confirmed?: NA Do you need transportation to your follow-up appointment?: No Do you understand care options if your condition(s) worsen?: Yes-patient verbalized understanding    [place], [place]

## 2022-06-02 NOTE — ED Triage Notes (Signed)
Pt BIB by family c/o right upper rib pain and cough since yesterday.

## 2022-06-02 NOTE — ED Notes (Addendum)
Pt recently dx with Afib and was started on Eliquis.

## 2022-06-02 NOTE — Discharge Instructions (Signed)
You were seen in the emergency room today with chest discomfort.  Your lab work here is reassuring.  I would like for you to follow with your primary care physician in the coming week.  You may use your lidocaine patches or other topical medication and/or Tylenol for pain relief. Return to the ED with any new or suddenly worsening symptoms.

## 2022-06-02 NOTE — ED Provider Notes (Signed)
Emergency Department Provider Note   I have reviewed the triage vital signs and the nursing notes.   HISTORY  Chief Complaint Cough   HPI [name] [name] is a 82 y.o. female past history reviewed below including hypertension, diabetes, diastolic CHF, CKD presents to the emergency department with right chest pain.  Pain is worse with coughing.  No pain at rest.  Denies any falls or chest injury.  No fevers or chills.  Symptoms been progressing since yesterday to the point where she was brought into the ED for evaluation by her daughter.    Past Medical History:  Diagnosis Date   Acute kidney injury superimposed on chronic kidney disease ([place]) 12/02/2021   Acute respiratory failure with hypoxia (HCC) 09/03/2021   Anemia due to chronic kidney disease 09/03/2021   BPPV (benign paroxysmal positional vertigo), left 01/24/2021   Chronic diastolic CHF (congestive heart failure) ( [place]) 09/02/2021   Combined forms of age-related cataract of both eyes 08/24/2016   CVA, old, hemiparesis ([place]) 02/06/2013   Diabetes mellitus ([place])    Diabetic macular edema ([place]) 08/22/2012   Diabetic retinopathy associated with type 2 diabetes mellitus ([place]) 09/20/2020   Essential hypertension    Gait abnormality 12/26/2021   H/O: CVA (cerebrovascular accident) 04/10/2000   History of falling 12/26/2021   HTN (hypertension)    Hyperlipidemia    Hypermagnesemia 12/02/2021   Hypertensive heart and renal disease 09/20/2020   Hypertensive urgency 09/02/2021   Hypokalemia 12/02/2021   Left ventricular diastolic dysfunction, NYHA class 1 02/06/2013   NVG (neovascular glaucoma), left, severe stage 07/24/2019   Overweight (BMI 25.0-29.9) 10/09/2018   Poorly controlled type 2 diabetes mellitus with circulatory disorder (HCC)    Prolonged QT interval 12/02/2021   Pulmonary HTN ([place])    mild by echo   Sensorineural hearing loss (SNHL), bilateral 01/24/2021   Stage 3b chronic kidney disease ([place]) 12/26/2021    Syncope and collapse 12/01/2021    Review of Systems  Constitutional: No fever/chills Cardiovascular: Positive chest pain. Respiratory: Denies shortness of breath. Gastrointestinal: No abdominal pain.  No nausea, no vomiting.   Genitourinary: Negative for dysuria. Musculoskeletal: Negative for back pain. Skin: Negative for rash. Neurological: Negative for headaches.  ____________________________________________   PHYSICAL EXAM:  VITAL SIGNS: ED Triage Vitals  Enc Vitals Group     BP 06/02/22 0046 (!) 176/76     Pulse Rate 06/02/22 0046 71     Resp 06/02/22 0046 12     Temp --      Temp src --      SpO2 06/02/22 0046 96 %     Weight 06/02/22 0042 147 lb (66.7 kg)     Height 06/02/22 0042 '5\' 2"'$  (1.575 m)   Constitutional: Alert. Well appearing and in no acute distress. Eyes: Conjunctivae are normal.  Head: Atraumatic. Nose: No congestion/rhinnorhea. Mouth/Throat: Mucous membranes are moist.   Neck: No stridor.   Cardiovascular: Normal rate, regular rhythm. Good peripheral circulation. Grossly normal heart sounds.   Respiratory: Normal respiratory effort.  No retractions. Lungs CTAB. Gastrointestinal: Soft and nontender. No distention.  Musculoskeletal: No lower extremity tenderness nor edema.  Tenderness to palpation of the right anterior/lateral chest wall which quickly reproduces the patient's pain on exam with wincing. Neurologic:  Normal speech and language. No gross focal neurologic deficits are appreciated.  Skin:  Skin is warm, dry and intact. No rash noted.  ____________________________________________   LABS (all labs ordered are listed, but only abnormal results are displayed)  Labs  Reviewed  CBC WITH DIFFERENTIAL/PLATELET - Abnormal; Notable for the following components:      Result Value   WBC 10.9 (*)    RBC 2.87 (*)    Hemoglobin 8.6 (*)    HCT 26.0 (*)    Monocytes Absolute 1.2 (*)    All other components within normal limits  COMPREHENSIVE  METABOLIC PANEL - Abnormal; Notable for the following components:   CO2 19 (*)    Glucose, Bld 222 (*)    BUN 62 (*)    Creatinine, Ser 5.37 (*)    Calcium 8.3 (*)    Total Protein 5.8 (*)    Albumin 2.3 (*)    AST 14 (*)    GFR, Estimated 7 (*)    All other components within normal limits  TROPONIN I (HIGH SENSITIVITY) - Abnormal; Notable for the following components:   Troponin I (High Sensitivity) 41 (*)    All other components within normal limits  RESP PANEL BY RT-PCR (RSV, FLU A&B, COVID)  RVPGX2  LIPASE, BLOOD  TROPONIN I (HIGH SENSITIVITY)   ____________________________________________  EKG   EKG Interpretation  Date/Time:  Friday June 02 2022 00:47:09 EST Ventricular Rate:  73 PR Interval:  196 QRS Duration: 142 QT Interval:  453 QTC Calculation: 500 R Axis:   -34 Text Interpretation: Sinus rhythm Left bundle branch block Confirmed by Nanda Quinton [id number]) on 06/02/2022 12:53:26 AM        ____________________________________________  RADIOLOGY  DG Ribs Unilateral W/Chest Right  Result Date: 06/02/2022 CLINICAL DATA:  10031 Cough 786.2.ICD-9-CM Pt BIB by family c/o right upper rib pain and cough since yesterday. EXAM: RIGHT RIBS AND CHEST - 3+ VIEW COMPARISON:  Chest x-ray 12/01/2021 FINDINGS: The heart and mediastinal contours are unchanged. Atherosclerotic plaque. Biapical calcifications likely granulomas. Left base atelectasis. No focal consolidation. No pulmonary edema. No pleural effusion. No pneumothorax. No acute displaced fracture or other bone lesions are seen involving the right ribs. IMPRESSION: 1. No acute displaced right rib fracture. Please note, nondisplaced rib fractures may be occult on radiograph. 2. No acute cardiopulmonary abnormality. Electronically Signed   By: Iven Finn M.D.   On: 06/02/2022 01:29    ____________________________________________   PROCEDURES  Procedure(s) performed:   Procedures  None   ____________________________________________   INITIAL IMPRESSION / ASSESSMENT AND PLAN / ED COURSE  Pertinent labs & imaging results that were available during my care of the patient were reviewed by me and considered in my medical decision making (see chart for details).   This patient is Presenting for Evaluation of CP, which does require a range of treatment options, and is a complaint that involves a high risk of morbidity and mortality.  The Differential Diagnoses includes but is not exclusive to acute coronary syndrome, aortic dissection, pulmonary embolism, cardiac tamponade, community-acquired pneumonia, pericarditis, musculoskeletal chest wall pain, etc.   I did obtain Additional Historical Information from daughter at bedside.   Clinical Laboratory Tests Ordered, included COVID/Flu/RSV negative. Mild leukocytosis to 10.9.  Slight anemia similar to prior at 8.6 down slightly from 9.1.  Patient with chronic kidney disease with GFR of 7 compared to recent labs GFR of 9.  Essentially unchanged.  Troponin mildly elevated to 41, similar to prior values, likely in the setting of renal insufficiency. Lipase normal. Hyperglycemia without DKA.   Radiologic Tests Ordered, included CXR. I independently interpreted the images and agree with radiology interpretation.   Cardiac Monitor Tracing which shows NSR.    Social Determinants of  Health Risk patient is a non-smoker.   Medical Decision Making: Summary:  Presents to the emergency department with right chest pain worse with touching the area on exam or coughing.  No fracture on x-ray.  Despite the reproducible nature of the pain, given the patient's age and other risk factors, plan for screening blood work including troponin.  No right upper quadrant abdominal tenderness on exam. No rash to suspect zoster.   Reevaluation with update and discussion with patient and daughter. Labs are reassuring. They have lidoderm patches at home which they  can use along with Tylenol. Symptoms seem most consistent with MSK pain.   Considered admission but workup here is reassuring.   Patient's presentation is most consistent with acute presentation with potential threat to life or bodily function.   Disposition: discharge  ____________________________________________  FINAL CLINICAL IMPRESSION(S) / ED DIAGNOSES  Final diagnoses:  Precordial chest pain    Note:  This document was prepared using Dragon voice recognition software and may include unintentional dictation errors.  Nanda Quinton, MD, [facility] Emergency Medicine    Joanna Borawski, Wonda Olds, MD 06/02/22 [id number]

## 2022-06-05 DIAGNOSIS — E11649 Type 2 diabetes mellitus with hypoglycemia without coma: Secondary | ICD-10-CM | POA: Diagnosis not present

## 2022-06-05 DIAGNOSIS — Z794 Long term (current) use of insulin: Secondary | ICD-10-CM | POA: Diagnosis not present

## 2022-06-05 DIAGNOSIS — Z4681 Encounter for fitting and adjustment of insulin pump: Secondary | ICD-10-CM | POA: Diagnosis not present

## 2022-06-05 DIAGNOSIS — Z7985 Long-term (current) use of injectable non-insulin antidiabetic drugs: Secondary | ICD-10-CM | POA: Diagnosis not present

## 2022-06-06 ENCOUNTER — Ambulatory Visit: Payer: Self-pay

## 2022-06-06 ENCOUNTER — Telehealth: Payer: Self-pay

## 2022-06-06 NOTE — Chronic Care Management (AMB) (Signed)
   06/06/2022  [name] [name] 09/17/1940 [id number]   Reason for Encounter: Change in CCM enrollment status   Horris Latino RN Care Manager/Chronic Care Management [phone]

## 2022-06-06 NOTE — Telephone Encounter (Signed)
Spoke with pts daughter. She was notified of lab results. Pt will continue her current medication and follow up as planned.

## 2022-06-07 DIAGNOSIS — R262 Difficulty in walking, not elsewhere classified: Secondary | ICD-10-CM | POA: Diagnosis not present

## 2022-06-07 DIAGNOSIS — Z794 Long term (current) use of insulin: Secondary | ICD-10-CM | POA: Diagnosis not present

## 2022-06-07 DIAGNOSIS — Z4681 Encounter for fitting and adjustment of insulin pump: Secondary | ICD-10-CM | POA: Diagnosis not present

## 2022-06-07 DIAGNOSIS — Z9641 Presence of insulin pump (external) (internal): Secondary | ICD-10-CM | POA: Diagnosis not present

## 2022-06-07 DIAGNOSIS — I10 Essential (primary) hypertension: Secondary | ICD-10-CM | POA: Diagnosis not present

## 2022-06-07 DIAGNOSIS — E11649 Type 2 diabetes mellitus with hypoglycemia without coma: Secondary | ICD-10-CM | POA: Diagnosis not present

## 2022-06-07 DIAGNOSIS — M6281 Muscle weakness (generalized): Secondary | ICD-10-CM | POA: Diagnosis not present

## 2022-06-08 ENCOUNTER — Other Ambulatory Visit: Payer: Self-pay | Admitting: Internal Medicine

## 2022-06-08 DIAGNOSIS — M6281 Muscle weakness (generalized): Secondary | ICD-10-CM | POA: Diagnosis not present

## 2022-06-08 DIAGNOSIS — R0981 Nasal congestion: Secondary | ICD-10-CM

## 2022-06-08 DIAGNOSIS — R262 Difficulty in walking, not elsewhere classified: Secondary | ICD-10-CM | POA: Diagnosis not present

## 2022-06-08 DIAGNOSIS — I10 Essential (primary) hypertension: Secondary | ICD-10-CM | POA: Diagnosis not present

## 2022-06-09 ENCOUNTER — Telehealth: Payer: Self-pay

## 2022-06-09 DIAGNOSIS — M6281 Muscle weakness (generalized): Secondary | ICD-10-CM | POA: Diagnosis not present

## 2022-06-09 DIAGNOSIS — I10 Essential (primary) hypertension: Secondary | ICD-10-CM | POA: Diagnosis not present

## 2022-06-09 DIAGNOSIS — R262 Difficulty in walking, not elsewhere classified: Secondary | ICD-10-CM | POA: Diagnosis not present

## 2022-06-09 NOTE — Telephone Encounter (Signed)
     Patient  visit on 2/23  at [place] you been able to follow up with your primary care physician? Yes   The patient was or was not able to obtain any needed medicine or equipment. Yes   Are there diet recommendations that you are having difficulty following? Na    Patient expresses understanding of discharge instructions and education provided has no other needs at this time.  Yes     [facility] [phone] [address], [place], [place] [address] Phone: [phone] Email: [email or web address]

## 2022-06-12 ENCOUNTER — Telehealth: Payer: Self-pay

## 2022-06-12 ENCOUNTER — Other Ambulatory Visit: Payer: Self-pay

## 2022-06-12 DIAGNOSIS — M6281 Muscle weakness (generalized): Secondary | ICD-10-CM | POA: Diagnosis not present

## 2022-06-12 DIAGNOSIS — R262 Difficulty in walking, not elsewhere classified: Secondary | ICD-10-CM | POA: Diagnosis not present

## 2022-06-12 DIAGNOSIS — I10 Essential (primary) hypertension: Secondary | ICD-10-CM | POA: Diagnosis not present

## 2022-06-12 MED ORDER — CETIRIZINE HCL 10 MG PO TABS: 10.0000 mg | ORAL_TABLET | Freq: Every day | ORAL | 2 refills | Status: DC

## 2022-06-12 NOTE — Progress Notes (Signed)
Care Management & Coordination Services Pharmacy Team  Reason for Encounter: Medication coordination and delivery  Contacted patient to discuss medications and coordinate delivery from [facility]. Spoke with family on 06/12/2022  Cycle dispensing form sent to [name] [name] for review.   Last adherence delivery date: 05-24-2022  Patient is due for next adherence delivery on: 06-22-2022  This delivery to include: Vials  30 Days  Atorvastatin 20 mg daily  Ozempic 1 mg weekly  Cetirizine 10 mg daily  Metoprolol 25 mg 1/2 tablet daily   Needles  Amlodipine 5 mg daily  Hydroxyzine 25 mg 1-2 tabs at bedtime PRN  Insulin lispro use as directed with insulin pump Lasix 20 mg daily PRN Aspirin 81 mg daily  Patient declined the following medications this month: Aspirin- D/C Tresiba- doesn't use Latanoprost- plenty supply  Pen needles- doesn't need  Refills requested from providers include: Cetirizine   Confirmed delivery date of 06-22-2022, advised patient that pharmacy will contact them the morning of delivery.   Any concerns about your medications? No  How often do you forget or accidentally miss a dose? Never  Do you use a pillbox? Yes  Is patient in packaging No   Recent blood pressure readings are as follows: 02-23 134/68  Recent blood glucose readings are as follows: Patient daughter stated sugars have been better since insulin pump. No readings more than 170   Chart review: Recent office visits:  06-06-2022 Neldon Labella, RN (CCM).  05-18-2022 [name], [place]. START prednisone 20 mg 2 tabs daily for 5 days.  Recent consult visits:  06-07-2022 Christeen Douglas, RD (Diabetes education). Visit for diabetes  06-05-2022 Christeen Douglas, RD (Diabetes education). Visit for diabetes  05-30-2022 Trudi Ida, NP (Cardiology). EKG completed. Referral placed to home health. Patient reported not taking glucagon and prednisone. Patient reported taking  aspirin 325 mg daily instead of 81 mg daily.  05-15-2022 William Hamburger, MD (Diabetes education). Initial Visit for diabetes.  Hospital visits:  Medication Reconciliation was completed by comparing discharge summary, patient's EMR and Pharmacy list, and upon discussion with patient.  Admitted to the hospital on 06-02-2022 due to Chest pain. Discharge date was 06-02-2022. Discharged from [place]?Medications Started at [facility] Discharge:?? None  Medication Changes at Hospital Discharge: None  Medications Discontinued at Hospital Discharge: None  Medications that remain the same after Hospital Discharge:??  -All other medications will remain the same.    Hospital visits:  Medication Reconciliation was completed by comparing discharge summary, patient's EMR and Pharmacy list, and upon discussion with patient.  Admitted to the hospital on 05-16-2022 due to Swelling of right hand. Discharge date was 05-16-2022. Discharged from [facility] care at [facility]?Medications Started at [facility] Discharge:?? None  Medication Changes at Hospital Discharge: None  Medications Discontinued at Hospital Discharge: None  Medications that remain the same after Hospital Discharge:??  -All other medications will remain the same.    Medications: Outpatient Encounter Medications as of 06/12/2022  Medication Sig Note   ACCU-CHEK AVIVA PLUS test strip TEST THREE TIMES DAILY AS DIRECTED    amLODipine (NORVASC) 5 MG tablet Take 1 tablet (5 mg total) by mouth daily.    apixaban (ELIQUIS) 2.5 MG TABS tablet Take 1 tablet (2.5 mg total) by mouth 2 (two) times daily. (Patient not taking: Reported on 05/30/2022)    atorvastatin (LIPITOR) 20 MG tablet Take 1 tablet (20 mg total) by mouth daily.  azelastine (ASTELIN) 0.1 % nasal spray Place 1 spray into both nostrils 2 (two) times daily. Use in each nostril as directed    B-D UF III MINI PEN NEEDLES  31G X 5 MM MISC Use as directed 4x a day    blood glucose meter kit and supplies Dispense based on patient and insurance preference. Use up to four times daily as directed.dx code e11.65    Blood Glucose Monitoring Suppl (TRUE METRIX METER) DEVI Use to check blood sugars 3 times a day.    Cholecalciferol (VITAMIN D3) 50 MCG (2000 UT) TABS Take 1 tablet by mouth daily at 12 noon.    Ferrous Sulfate (IRON PO) Take 65 mg by mouth daily.    furosemide (LASIX) 20 MG tablet Take 1 tablet (20 mg total) by mouth as needed (for swelling once daily).    glucose blood (TRUE METRIX BLOOD GLUCOSE TEST) test strip Use as instructed to check blood sugar 3 times a day. Dx code e11.65    hydrALAZINE (APRESOLINE) 50 MG tablet Take 1 tablet (50 mg total) by mouth 3 (three) times daily.    hydrOXYzine (ATARAX) 25 MG tablet Take 1 tablet (25 mg total) by mouth 3 (three) times daily as needed for itching.    insulin lispro (HUMALOG KWIKPEN) 200 UNIT/ML KwikPen inject 8-10 units into THE SKIN 15 minutes BEFORE THREE main meals of THE DAY    Lancets (ACCU-CHEK SOFT TOUCH) lancets Check blood sugars 3 times daily E11.65    latanoprost (XALATAN) 0.005 % ophthalmic solution Place 1 drop into both eyes at bedtime.     metoprolol succinate (TOPROL-XL) 25 MG 24 hr tablet Take 0.5 tablets (12.5 mg total) by mouth daily.    Multiple Vitamin (MULTIVITAMIN) tablet Take 1 tablet by mouth daily.    Semaglutide, 1 MG/DOSE, (OZEMPIC, 1 MG/DOSE,) 2 MG/1.5ML SOPN Inject 1 mg into the skin once a week. 09/02/2021: Fridays   TRESIBA FLEXTOUCH 200 UNIT/ML FlexTouch Pen inject 20 units into THE SKIN daily    No facility-administered encounter medications on file as of 06/12/2022.   BP Readings from Last 3 Encounters:  06/02/22 134/68  05/30/22 138/64  05/16/22 (!) 182/66    Pulse Readings from Last 3 Encounters:  06/02/22 67  05/30/22 68  05/16/22 72    Lab Results  Component Value Date/Time   HGBA1C 8.4 (H) 03/22/2022 09:48 AM    HGBA1C 7.2 (H) 11/29/2021 02:23 PM   Lab Results  Component Value Date   CREATININE 5.37 (H) 06/02/2022   BUN 62 (H) 06/02/2022   GFRNONAA 7 (L) 06/02/2022   GFRAA 20 (L) 03/15/2020   NA 139 06/02/2022   K 3.5 06/02/2022   CALCIUM 8.3 (L) 06/02/2022   CO2 19 (L) 06/02/2022     [place] Pharmacist Assistant [phone]

## 2022-06-14 DIAGNOSIS — M6281 Muscle weakness (generalized): Secondary | ICD-10-CM | POA: Diagnosis not present

## 2022-06-14 DIAGNOSIS — I10 Essential (primary) hypertension: Secondary | ICD-10-CM | POA: Diagnosis not present

## 2022-06-14 DIAGNOSIS — R262 Difficulty in walking, not elsewhere classified: Secondary | ICD-10-CM | POA: Diagnosis not present

## 2022-06-15 ENCOUNTER — Emergency Department (HOSPITAL_COMMUNITY)
Admission: EM | Admit: 2022-06-15 | Discharge: 2022-06-16 | Disposition: A | Discharge status: Home / Self Care | Payer: Medicare Other | Attending: Emergency Medicine | Admitting: Emergency Medicine

## 2022-06-15 ENCOUNTER — Other Ambulatory Visit: Payer: Self-pay

## 2022-06-15 ENCOUNTER — Encounter (HOSPITAL_COMMUNITY): Payer: Self-pay | Admitting: Emergency Medicine

## 2022-06-15 DIAGNOSIS — N1832 Chronic kidney disease, stage 3b: Secondary | ICD-10-CM | POA: Diagnosis not present

## 2022-06-15 DIAGNOSIS — R739 Hyperglycemia, unspecified: Secondary | ICD-10-CM

## 2022-06-15 DIAGNOSIS — I129 Hypertensive chronic kidney disease with stage 1 through stage 4 chronic kidney disease, or unspecified chronic kidney disease: Secondary | ICD-10-CM | POA: Insufficient documentation

## 2022-06-15 DIAGNOSIS — E1122 Type 2 diabetes mellitus with diabetic chronic kidney disease: Secondary | ICD-10-CM | POA: Insufficient documentation

## 2022-06-15 DIAGNOSIS — I5032 Chronic diastolic (congestive) heart failure: Secondary | ICD-10-CM | POA: Diagnosis not present

## 2022-06-15 DIAGNOSIS — E1165 Type 2 diabetes mellitus with hyperglycemia: Secondary | ICD-10-CM | POA: Insufficient documentation

## 2022-06-15 LAB — CBC
HCT: 25.1 % — ABNORMAL LOW (ref 36.0–46.0)
Hemoglobin: 8.3 g/dL — ABNORMAL LOW (ref 12.0–15.0)
MCH: 29.5 pg (ref 26.0–34.0)
MCHC: 33.1 g/dL (ref 30.0–36.0)
MCV: 89.3 fL (ref 80.0–100.0)
Platelets: 466 10*3/uL — ABNORMAL HIGH (ref 150–400)
RBC: 2.81 MIL/uL — ABNORMAL LOW (ref 3.87–5.11)
RDW: 12.3 % (ref 11.5–15.5)
WBC: 10.5 10*3/uL (ref 4.0–10.5)
nRBC: 0 % (ref 0.0–0.2)

## 2022-06-15 LAB — BASIC METABOLIC PANEL
Anion gap: 10 (ref 5–15)
BUN: 51 mg/dL — ABNORMAL HIGH (ref 8–23)
CO2: 20 mmol/L — ABNORMAL LOW (ref 22–32)
Calcium: 8 mg/dL — ABNORMAL LOW (ref 8.9–10.3)
Chloride: 99 mmol/L (ref 98–111)
Creatinine, Ser: 4.83 mg/dL — ABNORMAL HIGH (ref 0.44–1.00)
GFR, Estimated: 8 mL/min — ABNORMAL LOW (ref >60)
Glucose, Bld: 405 mg/dL — ABNORMAL HIGH (ref 70–99)
Potassium: 3.7 mmol/L (ref 3.5–5.1)
Sodium: 129 mmol/L — ABNORMAL LOW (ref 135–145)

## 2022-06-15 LAB — CBG MONITORING, ED
Glucose-Capillary: 410 mg/dL — ABNORMAL HIGH (ref 70–99)
Glucose-Capillary: 408 mg/dL — ABNORMAL HIGH (ref 70–99)

## 2022-06-15 NOTE — ED Triage Notes (Signed)
Pt c/o hyperglycemia. Pt CBG was 166 at 0853. In triage, cbg 410. Pt and family not sure if insulin pump is working.  Pt not having any s/sx of hyperglycemia. 10 u insulin at 1823 via pump

## 2022-06-16 NOTE — ED Notes (Signed)
ED Provider at bedside. 

## 2022-06-16 NOTE — ED Provider Notes (Signed)
[facility] Emergency Department Provider Note MRN:  [id number]  Arrival date & time: 06/16/22     Chief Complaint   Hyperglycemia   History of Present Illness   [name] [name] is a 82 y.o. year-old female with a history of CKD, diabetes presenting to the ED with chief complaint of hyperglycemia.  New insulin pump over the past week or 2.  Was having some lower blood sugars at night last week and so her doses were decreased by her regular doctor.  Now her blood sugars are higher yesterday and today.  Up into the 400s.  Had an episode of nausea yesterday but today feels completely normal.  No pain, no fever, no burning with urination, no chest pain or shortness of breath, no abdominal pain.  Relatively normal diet, had some bigger breakfast than normal for the past 2 mornings.  Review of Systems  A thorough review of systems was obtained and all systems are negative except as noted in the HPI and PMH.   Patient's Health History    Past Medical History:  Diagnosis Date   Acute kidney injury superimposed on chronic kidney disease ([place]) 12/02/2021   Acute respiratory failure with hypoxia (HCC) 09/03/2021   Anemia due to chronic kidney disease 09/03/2021   BPPV (benign paroxysmal positional vertigo), left 01/24/2021   Chronic diastolic CHF (congestive heart failure) ([place]) 09/02/2021   Combined forms of age-related cataract of both eyes 08/24/2016   CVA, old, hemiparesis ([place]) 02/06/2013   Diabetes mellitus ([place])    Diabetic macular edema ([place]) 08/22/2012   Diabetic retinopathy associated with type 2 diabetes mellitus ([place]) 09/20/2020   Essential hypertension    Gait abnormality 12/26/2021   H/O: CVA (cerebrovascular accident) 04/10/2000   History of falling 12/26/2021   HTN (hypertension)    Hyperlipidemia    Hypermagnesemia 12/02/2021   Hypertensive heart and renal disease 09/20/2020   Hypertensive urgency 09/02/2021   Hypokalemia 12/02/2021   Left  ventricular diastolic dysfunction, NYHA class 1 02/06/2013   NVG (neovascular glaucoma), left, severe stage 07/24/2019   Overweight (BMI 25.0-29.9) 10/09/2018   Poorly controlled type 2 diabetes mellitus with circulatory disorder (HCC)    Prolonged QT interval 12/02/2021   Pulmonary HTN ([place])    mild by echo   Sensorineural hearing loss (SNHL), bilateral 01/24/2021   Stage 3b chronic kidney disease ([place]) 12/26/2021   Syncope and collapse 12/01/2021    Past Surgical History:  Procedure Laterality Date   ABDOMINAL HYSTERECTOMY     CARDIAC CATHETERIZATION  03/17/2003   Normal, patent coronary arteries. Normal LV systolic function   CARDIOVASCULAR STRESS TEST  08/28/2006   Small sized, moderate ischemiain the Basal Inferoseptal, Basal Inferior, Mid Inferior and Apical Inferior regions   TRANSTHORACIC ECHOCARDIOGRAM  02/17/2010   EF >55%, moderate-severely thickened septum, mild-moderate mitral annular calcification    Family History  Problem Relation Age of Onset   Hypertension Mother    Hypertension Father    Heart Problems Father    Hypertension Sister    Diabetes Sister    Hypertension Brother    Diabetes Brother    Breast cancer Neg Hx     Social History   Socioeconomic History   Marital status: Married    Spouse name: Not on file   Number of children: Not on file   Years of education: Not on file   Highest education level: Not on file  Occupational History   Occupation: retired  Tobacco Use   Smoking status:  Never   Smokeless tobacco: Never  Vaping Use   Vaping Use: Never used  Substance and Sexual Activity   Alcohol use: No   Drug use: Never   Sexual activity: Not Currently  Other Topics Concern   Not on file  Social History Narrative   Not on file   Social Determinants of Health   Financial Resource Strain: Low Risk  (10/19/2021)   Overall Financial Resource Strain (CARDIA)    Difficulty of Paying Living Expenses: Not hard at all  Food Insecurity: No Food  Insecurity (10/19/2021)   Hunger Vital Sign    Worried About Running Out of Food in the Last Year: Never true    Ran Out of Food in the Last Year: Never true  Transportation Needs: No Transportation Needs (10/19/2021)   PRAPARE - Hydrologist (Medical): No    Lack of Transportation (Non-Medical): No  Physical Activity: Inactive (10/19/2021)   Exercise Vital Sign    Days of Exercise per Week: 0 days    Minutes of Exercise per Session: 0 min  Stress: No Stress Concern Present (10/19/2021)   [place]    Feeling of Stress : Not at all  Social Connections: Not on file  Intimate Partner Violence: Not At Risk (08/13/2018)   Humiliation, Afraid, Rape, and Kick questionnaire    Fear of Current or Ex-Partner: No    Emotionally Abused: No    Physically Abused: No    Sexually Abused: No     Physical Exam   Vitals:   06/15/22 2230 06/15/22 2300  BP:  (!) 191/70  Pulse: 71 70  Resp: 11 10  Temp:    SpO2: 97% 96%    CONSTITUTIONAL: Well-appearing, NAD NEURO/PSYCH:  Alert and oriented x 3, no focal deficits EYES:  eyes equal and reactive ENT/NECK:  no LAD, no JVD CARDIO: Regular rate, well-perfused, normal S1 and S2 PULM:  CTAB no wheezing or rhonchi GI/GU:  non-distended, non-tender MSK/SPINE:  No gross deformities, no edema SKIN:  no rash, atraumatic   *Additional and/or pertinent findings included in MDM below  Diagnostic and Interventional Summary    EKG Interpretation  Date/Time:    Ventricular Rate:    PR Interval:    QRS Duration:   QT Interval:    QTC Calculation:   R Axis:     Text Interpretation:         Labs Reviewed  CBC - Abnormal; Notable for the following components:      Result Value   RBC 2.81 (*)    Hemoglobin 8.3 (*)    HCT 25.1 (*)    Platelets 466 (*)    All other components within normal limits  BASIC METABOLIC PANEL - Abnormal; Notable for the  following components:   Sodium 129 (*)    CO2 20 (*)    Glucose, Bld 405 (*)    BUN 51 (*)    Creatinine, Ser 4.83 (*)    Calcium 8.0 (*)    GFR, Estimated 8 (*)    All other components within normal limits  CBG MONITORING, ED - Abnormal; Notable for the following components:   Glucose-Capillary 410 (*)    All other components within normal limits  CBG MONITORING, ED - Abnormal; Notable for the following components:   Glucose-Capillary 408 (*)    All other components within normal limits    No orders to display    Medications -  No data to display   Procedures  /  Critical Care Procedures  ED Course and Medical Decision Making  Initial Impression and Ddx Asymptomatic hyperglycemia.  Hypertensive on arrival but otherwise reassuring vital signs, sitting comfortably.  Insulin pump seems to be working well.  No infectious symptoms.  Will evaluate for evidence of DKA but this is felt to be unlikely.  Suspect her dosing needs further adjustments given that it is a new regimen.  Past medical/surgical history that increases complexity of ED encounter: Diabetes  Interpretation of Diagnostics I personally reviewed the amatory assessment and my interpretation is as follows: Kidney function is at or near recent baseline, hyperglycemia without evidence of DKA, pseudohyponatremia    Patient Reassessment and Ultimate Disposition/Management     Appropriate for discharge as there is no emergent process evident.  Patient management required discussion with the following services or consulting groups:  None  Complexity of Problems Addressed Acute illness or injury that poses threat of life of bodily function  Additional Data Reviewed and Analyzed Further history obtained from: Further history from spouse/family member  Additional Factors Impacting ED Encounter Risk None  Barth Kirks. [place] Small, [place] [email or web address]  Final  Clinical Impressions(s) / ED Diagnoses     ICD-10-CM   1. Hyperglycemia  R73.9       ED Discharge Orders     None        Discharge Instructions Discussed with and Provided to Patient:    Discharge Instructions      You were evaluated in the Emergency Department and after careful evaluation, we did not find any emergent condition requiring admission or further testing in the hospital.  Your exam/testing today was overall reassuring.  Recommend following up closely with your diabetes doctor to discuss the elevated blood sugars.    Please return to the Emergency Department if you experience any worsening of your condition.  Thank you for allowing [place] to be a part of your care.       Maudie Flakes, MD 06/16/22 [phone]

## 2022-06-16 NOTE — Discharge Instructions (Signed)
You were evaluated in the Emergency Department and after careful evaluation, we did not find any emergent condition requiring admission or further testing in the hospital.  Your exam/testing today was overall reassuring.  Recommend following up closely with your diabetes doctor to discuss the elevated blood sugars.    Please return to the Emergency Department if you experience any worsening of your condition.  Thank you for allowing [place] to be a part of your care.

## 2022-06-19 DIAGNOSIS — E11649 Type 2 diabetes mellitus with hypoglycemia without coma: Secondary | ICD-10-CM | POA: Diagnosis not present

## 2022-06-19 DIAGNOSIS — Z794 Long term (current) use of insulin: Secondary | ICD-10-CM | POA: Diagnosis not present

## 2022-06-20 DIAGNOSIS — R262 Difficulty in walking, not elsewhere classified: Secondary | ICD-10-CM | POA: Diagnosis not present

## 2022-06-20 DIAGNOSIS — I10 Essential (primary) hypertension: Secondary | ICD-10-CM | POA: Diagnosis not present

## 2022-06-20 DIAGNOSIS — M6281 Muscle weakness (generalized): Secondary | ICD-10-CM | POA: Diagnosis not present

## 2022-06-21 DIAGNOSIS — I130 Hypertensive heart and chronic kidney disease with heart failure and stage 1 through stage 4 chronic kidney disease, or unspecified chronic kidney disease: Secondary | ICD-10-CM | POA: Diagnosis not present

## 2022-06-21 DIAGNOSIS — I480 Paroxysmal atrial fibrillation: Secondary | ICD-10-CM | POA: Diagnosis not present

## 2022-06-21 DIAGNOSIS — Z7901 Long term (current) use of anticoagulants: Secondary | ICD-10-CM | POA: Diagnosis not present

## 2022-06-21 DIAGNOSIS — E1142 Type 2 diabetes mellitus with diabetic polyneuropathy: Secondary | ICD-10-CM | POA: Diagnosis not present

## 2022-06-21 DIAGNOSIS — I5032 Chronic diastolic (congestive) heart failure: Secondary | ICD-10-CM | POA: Diagnosis not present

## 2022-06-21 DIAGNOSIS — Z9181 History of falling: Secondary | ICD-10-CM | POA: Diagnosis not present

## 2022-06-21 DIAGNOSIS — E785 Hyperlipidemia, unspecified: Secondary | ICD-10-CM | POA: Diagnosis not present

## 2022-06-21 DIAGNOSIS — I272 Pulmonary hypertension, unspecified: Secondary | ICD-10-CM | POA: Diagnosis not present

## 2022-06-21 DIAGNOSIS — E1122 Type 2 diabetes mellitus with diabetic chronic kidney disease: Secondary | ICD-10-CM | POA: Diagnosis not present

## 2022-06-21 DIAGNOSIS — Z556 Problems related to health literacy: Secondary | ICD-10-CM | POA: Diagnosis not present

## 2022-06-21 DIAGNOSIS — N184 Chronic kidney disease, stage 4 (severe): Secondary | ICD-10-CM | POA: Diagnosis not present

## 2022-06-21 DIAGNOSIS — Z7985 Long-term (current) use of injectable non-insulin antidiabetic drugs: Secondary | ICD-10-CM | POA: Diagnosis not present

## 2022-06-21 DIAGNOSIS — Z8673 Personal history of transient ischemic attack (TIA), and cerebral infarction without residual deficits: Secondary | ICD-10-CM | POA: Diagnosis not present

## 2022-06-21 DIAGNOSIS — Z794 Long term (current) use of insulin: Secondary | ICD-10-CM | POA: Diagnosis not present

## 2022-06-22 DIAGNOSIS — Z794 Long term (current) use of insulin: Secondary | ICD-10-CM | POA: Diagnosis not present

## 2022-06-22 DIAGNOSIS — Z9181 History of falling: Secondary | ICD-10-CM | POA: Diagnosis not present

## 2022-06-22 DIAGNOSIS — Z556 Problems related to health literacy: Secondary | ICD-10-CM | POA: Diagnosis not present

## 2022-06-22 DIAGNOSIS — I130 Hypertensive heart and chronic kidney disease with heart failure and stage 1 through stage 4 chronic kidney disease, or unspecified chronic kidney disease: Secondary | ICD-10-CM | POA: Diagnosis not present

## 2022-06-22 DIAGNOSIS — N184 Chronic kidney disease, stage 4 (severe): Secondary | ICD-10-CM | POA: Diagnosis not present

## 2022-06-22 DIAGNOSIS — E1142 Type 2 diabetes mellitus with diabetic polyneuropathy: Secondary | ICD-10-CM | POA: Diagnosis not present

## 2022-06-22 DIAGNOSIS — I272 Pulmonary hypertension, unspecified: Secondary | ICD-10-CM | POA: Diagnosis not present

## 2022-06-22 DIAGNOSIS — I480 Paroxysmal atrial fibrillation: Secondary | ICD-10-CM | POA: Diagnosis not present

## 2022-06-22 DIAGNOSIS — Z7985 Long-term (current) use of injectable non-insulin antidiabetic drugs: Secondary | ICD-10-CM | POA: Diagnosis not present

## 2022-06-22 DIAGNOSIS — Z8673 Personal history of transient ischemic attack (TIA), and cerebral infarction without residual deficits: Secondary | ICD-10-CM | POA: Diagnosis not present

## 2022-06-22 DIAGNOSIS — E1122 Type 2 diabetes mellitus with diabetic chronic kidney disease: Secondary | ICD-10-CM | POA: Diagnosis not present

## 2022-06-22 DIAGNOSIS — I5032 Chronic diastolic (congestive) heart failure: Secondary | ICD-10-CM | POA: Diagnosis not present

## 2022-06-22 DIAGNOSIS — Z7901 Long term (current) use of anticoagulants: Secondary | ICD-10-CM | POA: Diagnosis not present

## 2022-06-22 DIAGNOSIS — E785 Hyperlipidemia, unspecified: Secondary | ICD-10-CM | POA: Diagnosis not present

## 2022-06-23 ENCOUNTER — Encounter (HOSPITAL_BASED_OUTPATIENT_CLINIC_OR_DEPARTMENT_OTHER): Payer: Self-pay | Admitting: Family

## 2022-06-23 ENCOUNTER — Ambulatory Visit (HOSPITAL_BASED_OUTPATIENT_CLINIC_OR_DEPARTMENT_OTHER): Payer: Medicare Other | Admitting: Family

## 2022-06-23 VITALS — BP 156/74 | HR 73 | Ht 62.0 in | Wt 149.3 lb

## 2022-06-23 DIAGNOSIS — I10 Essential (primary) hypertension: Secondary | ICD-10-CM

## 2022-06-23 DIAGNOSIS — H5201 Hypermetropia, right eye: Secondary | ICD-10-CM | POA: Diagnosis not present

## 2022-06-23 DIAGNOSIS — H52201 Unspecified astigmatism, right eye: Secondary | ICD-10-CM | POA: Diagnosis not present

## 2022-06-23 DIAGNOSIS — I5032 Chronic diastolic (congestive) heart failure: Secondary | ICD-10-CM | POA: Diagnosis not present

## 2022-06-23 DIAGNOSIS — H5440 Blindness, one eye, unspecified eye: Secondary | ICD-10-CM | POA: Diagnosis not present

## 2022-06-23 DIAGNOSIS — D6859 Other primary thrombophilia: Secondary | ICD-10-CM

## 2022-06-23 DIAGNOSIS — H524 Presbyopia: Secondary | ICD-10-CM | POA: Diagnosis not present

## 2022-06-23 DIAGNOSIS — I480 Paroxysmal atrial fibrillation: Secondary | ICD-10-CM

## 2022-06-23 MED ORDER — HYDRALAZINE HCL 50 MG PO TABS: 75.0000 mg | ORAL_TABLET | Freq: Three times a day (TID) | ORAL | 3 refills | Status: DC

## 2022-06-23 NOTE — Progress Notes (Signed)
Office Visit    Patient Name: [name] [name] Date of Encounter: 06/23/2022  PCP:  Glendale Chard, [facility]  Cardiologist:  Shelva Majestic, MD  Advanced Practice Provider:  No care team member to display Electrophysiologist:  None   Chief Complaint    [name] [name] is a 82 y.o. female presents today for follow up of atrial fibrillation.    Past Medical History    Past Medical History:  Diagnosis Date   Acute kidney injury superimposed on chronic kidney disease ([place]) 12/02/2021   Acute respiratory failure with hypoxia (HCC) 09/03/2021   Anemia due to chronic kidney disease 09/03/2021   BPPV (benign paroxysmal positional vertigo), left 01/24/2021   Chronic diastolic CHF (congestive heart failure) ([place]) 09/02/2021   Combined forms of age-related cataract of both eyes 08/24/2016   CVA, old, hemiparesis ([place]) 02/06/2013   Diabetes mellitus ([place])    Diabetic macular edema ([place]) 08/22/2012   Diabetic retinopathy associated with type 2 diabetes mellitus ([place]) 09/20/2020   Essential hypertension    Gait abnormality 12/26/2021   H/O: CVA (cerebrovascular accident) 04/10/2000   History of falling 12/26/2021   HTN (hypertension)    Hyperlipidemia    Hypermagnesemia 12/02/2021   Hypertensive heart and renal disease 09/20/2020   Hypertensive urgency 09/02/2021   Hypokalemia 12/02/2021   Left ventricular diastolic dysfunction, NYHA class 1 02/06/2013   NVG (neovascular glaucoma), left, severe stage 07/24/2019   Overweight (BMI 25.0-29.9) 10/09/2018   Poorly controlled type 2 diabetes mellitus with circulatory disorder (HCC)    Prolonged QT interval 12/02/2021   Pulmonary HTN ([place])    mild by echo   Sensorineural hearing loss (SNHL), bilateral 01/24/2021   Stage 3b chronic kidney disease ([place]) 12/26/2021   Syncope and collapse 12/01/2021   Past Surgical History:  Procedure Laterality Date   ABDOMINAL HYSTERECTOMY     CARDIAC CATHETERIZATION   03/17/2003   Normal, patent coronary arteries. Normal LV systolic function   CARDIOVASCULAR STRESS TEST  08/28/2006   Small sized, moderate ischemiain the Basal Inferoseptal, Basal Inferior, Mid Inferior and Apical Inferior regions   TRANSTHORACIC ECHOCARDIOGRAM  02/17/2010   EF >55%, moderate-severely thickened septum, mild-moderate mitral annular calcification    Allergies  Allergies  Allergen Reactions   Brimonidine Other (See Comments)    Eyes swell and itch   Dorzolamide Other (See Comments)    Other reaction(s): Other (See Comments)  Red, inflamed eyelid, dermatitis   Latex     Blisters/ skin irritation   Timolol Other (See Comments)    Caused eyes to swell and itch    History of Present Illness    [name] [name] is a 82 y.o. female with a hx of HFpEF, HTN, HLD, DM2, CKD IV, diabetic polyneuropathy, CVA (2002), syncope, atrial fibrillation last seen 05/30/22 by Venia Carbon, NP.  Remote CVA in 2002.  Cardiac catheterization 2004 with normal coronaries.  Myoview May 2018 negative for ischemia.  Echo 03/2021 LVEF 65 to [id number], grade 1 diastolic dysfunction, moderate LVH, severe MAC, no aortic stenosis.  Admitted May 2023 with acute on chronic diastolic heart failure.  Amlodipine previously decreased to 10 mg to 5 mg due to lower extremity edema and Lasix increased to 20 mg daily.  Hydralazine increased to 50 mg p.o. 3 times daily by PCP due to persistently elevated BP  Hospitalized August 2023 following syncopal episode.  CT head negative and EEG, carotid Dopplers unremarkable.  Repeat echo 11/2021 LVEF 60 to  65%, no RWMA, mild LVH, indeterminate diastolic parameters, RV normal systolic function, mild to moderate MS, moderate to severe MAC which is overall unchanged.  MRI brain no acute abnormality.  Suspected related to dehydration and orthostatic hypotension.  She was recommended for 30-day monitor office visit 12/22/2021 but it was not placed until 04/2022.  She saw Venia Carbon, NP 05/30/22 after monitor revealed atrial fibrillation.  She was started on Eliquis 2.5 mg twice daily and metoprolol continued.  She was unaware of atrial fibrillation and normal sinus rhythm at time of her clinic visit.  ED visit 06/02/22 with right sided chest pain worse with coughing. COVID/flu/RSV negative. Stable mild anemia Hb 9.6. Troponin 41 similar to previous. CXR no acute findings. Pain was reproducibile on exam and consistent with musculoskeletal pain.   ED visit 06/15/22 hyperglycemia with no symptoms and was instructed that as long as without symptoms CBG up to 400 okay to follow-up outpatient  30-day monitor report with preliminary report with 2% burden of atrial fibrillation which was rate controlled. 2% PVC and 4% PAC burden. Otherwise NSR with no significant pauses. Formal report not yet reviewed by primary cardiologist.   She presents today for follow up with her daughter. Since last seen participating in [facility] PT/OT and is noticing improvement in energy levels, exertional dyspnea, exercise tolerance.  She is taking her furosemide daily and reports no significant lower extremity edema.  Reports no chest pain, dyspnea, palpitations.  Taking her morning medication prior to breakfast, lunchtime, last dose at dinnertime.  Amlodipine in the morning and hydralazine 3 times per day.  BP at home has been in the 150s when checked by home health.   EKGs/Labs/Other Studies Reviewed:   The following studies were reviewed today:  Cardiac Studies & Procedures     STRESS TESTS  NM MYOCAR MULTI W/SPECT W 03/31/2013   ECHOCARDIOGRAM  ECHOCARDIOGRAM COMPLETE 12/02/2021  Narrative ECHOCARDIOGRAM REPORT    Patient Name:   [name] [name] Date of Exam: 12/02/2021 Medical Rec #:  [id number]         Height:       62.0 in Accession #:    [id number]        Weight:       162.0 lb Date of Birth:  [birth year]         BSA:          1.748 m Patient Age:    10 years          BP:           169/58  mmHg Patient Gender: F                 HR:           71 bpm. Exam Location:  [facility]  Procedure: 2D Echo, Cardiac Doppler and Color Doppler  Indications:    Syncope  History:        Patient has prior history of Echocardiogram examinations, most recent 04/05/2021. CHF, Signs/Symptoms:Syncope; Risk Factors:Hypertension, Diabetes and Dyslipidemia.  Sonographer:    Wenda Low Referring Phys: [id number] OLADAPO ADEFESO   Sonographer Comments: Image acquisition challenging due to respiratory motion. IMPRESSIONS   1. Left ventricular ejection fraction, by estimation, is 60 to 65%. The left ventricle has normal function. The left ventricle has no regional wall motion abnormalities. There is mild left ventricular hypertrophy. Left ventricular diastolic parameters are indeterminate. 2. Right ventricular systolic function is normal. The right ventricular size is normal. Tricuspid regurgitation signal  is inadequate for assessing PA pressure. 3. Left atrial size was moderately dilated. 4. The mitral valve is abnormal. No evidence of mitral valve regurgitation. Mild to moderate mitral stenosis. The mean mitral valve gradient is 5.5 mmHg. Moderate to severe mitral annular calcification. 5. The aortic valve was not well visualized. Aortic valve regurgitation is not visualized. 6. The inferior vena cava is normal in size with greater than 50% respiratory variability, suggesting right atrial pressure of 3 mmHg.  Comparison(s): No significant change from prior study.  FINDINGS Left Ventricle: Left ventricular ejection fraction, by estimation, is 60 to 65%. The left ventricle has normal function. The left ventricle has no regional wall motion abnormalities. The left ventricular internal cavity size was normal in size. There is mild left ventricular hypertrophy. Left ventricular diastolic parameters are indeterminate.  Right Ventricle: The right ventricular size is normal. Right ventricular  systolic function is normal. Tricuspid regurgitation signal is inadequate for assessing PA pressure.  Left Atrium: Left atrial size was moderately dilated.  Right Atrium: Right atrial size was normal in size.  Pericardium: Trivial pericardial effusion is present.  Mitral Valve: The mitral valve is abnormal. Moderate to severe mitral annular calcification. No evidence of mitral valve regurgitation. Mild to moderate mitral valve stenosis. MV peak gradient, 12.7 mmHg. The mean mitral valve gradient is 5.5 mmHg.  Tricuspid Valve: The tricuspid valve is normal in structure. Tricuspid valve regurgitation is not demonstrated.  Aortic Valve: The aortic valve was not well visualized. Aortic valve regurgitation is not visualized. Aortic valve mean gradient measures 6.0 mmHg. Aortic valve peak gradient measures 12.1 mmHg. Aortic valve area, by VTI measures 2.64 cm.  Pulmonic Valve: Pulmonic valve regurgitation is trivial.  Aorta: The aortic root and ascending aorta are structurally normal, with no evidence of dilitation.  Venous: The inferior vena cava is normal in size with greater than 50% respiratory variability, suggesting right atrial pressure of 3 mmHg.   LEFT VENTRICLE PLAX 2D LVIDd:         5.20 cm     Diastology LVIDs:         2.50 cm     LV e' medial:    3.09 cm/s LV PW:         1.20 cm     LV E/e' medial:  50.5 LV IVS:        1.40 cm     LV e' lateral:   7.03 cm/s LVOT diam:     2.00 cm     LV E/e' lateral: 22.2 LV SV:         105 LV SV Index:   60 LVOT Area:     3.14 cm  LV Volumes (MOD) LV vol d, MOD A2C: 57.5 ml LV vol d, MOD A4C: 56.1 ml LV vol s, MOD A2C: 16.3 ml LV vol s, MOD A4C: 17.6 ml LV SV MOD A2C:     41.2 ml LV SV MOD A4C:     56.1 ml LV SV MOD BP:      40.1 ml  RIGHT VENTRICLE RV Basal diam:  3.25 cm RV Mid diam:    2.50 cm RV S prime:     14.30 cm/s TAPSE (M-mode): 2.3 cm  LEFT ATRIUM             Index        RIGHT ATRIUM           Index LA diam:         4.60 cm  2.63 cm/m   RA Area:     15.60 cm LA Vol (A2C):   79.9 ml 45.71 ml/m  RA Volume:   38.80 ml  22.20 ml/m LA Vol (A4C):   70.5 ml 40.33 ml/m LA Biplane Vol: 76.5 ml 43.77 ml/m AORTIC VALVE                     PULMONIC VALVE AV Area (Vmax):    2.37 cm      PV Vmax:       0.95 m/s AV Area (Vmean):   2.40 cm      PV Peak grad:  3.6 mmHg AV Area (VTI):     2.64 cm AV Vmax:           174.00 cm/s AV Vmean:          112.000 cm/s AV VTI:            0.398 m AV Peak Grad:      12.1 mmHg AV Mean Grad:      6.0 mmHg LVOT Vmax:         131.00 cm/s LVOT Vmean:        85.400 cm/s LVOT VTI:          0.334 m LVOT/AV VTI ratio: 0.84  AORTA Ao Root diam: 3.20 cm  MITRAL VALVE MV Area (PHT): 3.07 cm     SHUNTS MV Area VTI:   2.02 cm     Systemic VTI:  0.33 m MV Peak grad:  12.7 mmHg    Systemic Diam: 2.00 cm MV Mean grad:  5.5 mmHg MV Vmax:       1.78 m/s MV Vmean:      108.5 cm/s MV Decel Time: 247 msec MV E velocity: 156.00 cm/s MV A velocity: 151.00 cm/s MV E/A ratio:  1.03  Mary Scientist, physiological signed by Phineas Inches Signature Date/Time: 12/02/2021/1:40:42 PM    Final    MONITORS  CARDIAC EVENT MONITOR 06/05/2022            EKG:  EKG is not ordered today.    Recent Labs: 11/23/2021: B Natriuretic Peptide 330.4 05/30/2022: Magnesium 2.0; TSH 2.230 06/02/2022: ALT 11 06/15/2022: BUN 51; Creatinine, Ser 4.83; Hemoglobin 8.3; Platelets 466; Potassium 3.7; Sodium 129  Recent Lipid Panel    Component Value Date/Time   CHOL 314 (H) 03/22/2022 0948   TRIG 300 (H) 03/22/2022 0948   HDL 50 03/22/2022 0948   CHOLHDL 6.3 (H) 03/22/2022 0948   CHOLHDL 4 10/09/2018 0905   VLDL 37.8 10/09/2018 0905   LDLCALC 204 (H) 03/22/2022 0948    Risk Assessment/Calculations:   CHA2DS2-VASc Score = 6   This indicates a 9.7% annual risk of stroke. The patient's score is based upon: CHF History: 1 HTN History: 1 Diabetes History: 1 Stroke History: 0 Vascular Disease  History: 0 Age Score: 2 Gender Score: 1     Home Medications   Current Meds  Medication Sig   ACCU-CHEK AVIVA PLUS test strip TEST THREE TIMES DAILY AS DIRECTED   amLODipine (NORVASC) 5 MG tablet Take 1 tablet (5 mg total) by mouth daily.   apixaban (ELIQUIS) 2.5 MG TABS tablet Take 1 tablet (2.5 mg total) by mouth 2 (two) times daily.   atorvastatin (LIPITOR) 20 MG tablet Take 1 tablet (20 mg total) by mouth daily.   azelastine (ASTELIN) 0.1 % nasal spray Place 1 spray into both nostrils 2 (two) times daily. Use in each nostril as directed  B-D UF III MINI PEN NEEDLES 31G X 5 MM MISC Use as directed 4x a day   blood glucose meter kit and supplies Dispense based on patient and insurance preference. Use up to four times daily as directed.dx code e11.65   Blood Glucose Monitoring Suppl (TRUE METRIX METER) DEVI Use to check blood sugars 3 times a day.   cetirizine (ZYRTEC ALLERGY) 10 MG tablet Take 1 tablet (10 mg total) by mouth daily.   Cholecalciferol (VITAMIN D3) 50 MCG (2000 UT) TABS Take 1 tablet by mouth daily at 12 noon.   Ferrous Sulfate (IRON PO) Take 65 mg by mouth daily.   furosemide (LASIX) 20 MG tablet Take 1 tablet (20 mg total) by mouth as needed (for swelling once daily).   glucose blood (TRUE METRIX BLOOD GLUCOSE TEST) test strip Use as instructed to check blood sugar 3 times a day. Dx code e11.65   hydrOXYzine (ATARAX) 25 MG tablet Take 1 tablet (25 mg total) by mouth 3 (three) times daily as needed for itching.   insulin lispro (HUMALOG KWIKPEN) 200 UNIT/ML KwikPen inject 8-10 units into THE SKIN 15 minutes BEFORE THREE main meals of THE DAY   Lancets (ACCU-CHEK SOFT TOUCH) lancets Check blood sugars 3 times daily E11.65   latanoprost (XALATAN) 0.005 % ophthalmic solution Place 1 drop into both eyes at bedtime.    metoprolol succinate (TOPROL-XL) 25 MG 24 hr tablet Take 0.5 tablets (12.5 mg total) by mouth daily.   Multiple Vitamin (MULTIVITAMIN) tablet Take 1 tablet by  mouth daily.   Semaglutide, 1 MG/DOSE, (OZEMPIC, 1 MG/DOSE,) 2 MG/1.5ML SOPN Inject 1 mg into the skin once a week.   TRESIBA FLEXTOUCH 200 UNIT/ML FlexTouch Pen inject 20 units into THE SKIN daily   [DISCONTINUED] hydrALAZINE (APRESOLINE) 50 MG tablet Take 1 tablet (50 mg total) by mouth 3 (three) times daily.     Review of Systems      All other systems reviewed and are otherwise negative except as noted above.  Physical Exam    VS:  BP (!) 156/74 (BP Location: Right Arm, Patient Position: Sitting, Cuff Size: Normal)   Pulse 73   Ht 5\' 2"  (1.575 m)   Wt 149 lb 4.8 oz (67.7 kg)   SpO2 98%   BMI 27.31 kg/m  , BMI Body mass index is 27.31 kg/m.  Wt Readings from Last 3 Encounters:  06/23/22 149 lb 4.8 oz (67.7 kg)  06/15/22 147 lb 0.8 oz (66.7 kg)  06/02/22 147 lb (66.7 kg)    GEN: Well nourished, well developed, in no acute distress. HEENT: normal. Neck: Supple, no JVD, carotid bruits, or masses. Cardiac: RRR, no murmurs, rubs, or gallops. No clubbing, cyanosis, edema.  Radials/PT 2+ and equal bilaterally.  Respiratory:  Respirations regular and unlabored, clear to auscultation bilaterally. GI: Soft, nontender, nondistended. MS: No deformity or atrophy. Skin: Warm and dry, no rash. Neuro:  Strength and sensation are intact. Psych: Normal affect.  Assessment & Plan    Paroxysmal atrial fibrillation/PVC/PAC-30-day monitor with 2% PAF burden which was well-controlled.  Continue present dose metoprolol.  Continue Eliquis 2.5 mg twice daily.  Dose reduced due to age, renal function.  Reports no bleeding complications.  She is unaware of atrial fibrillation with no palpitations.  Hypertension-BP not at goal of less than 130/80.  Increase hydralazine from 50 mg 3 times daily to 75 mg 3 times daily.  Continue amlodipine 5 mg daily (previously had swelling on increased doses), Lasix 20 mg daily.  Could consider further increases hydralazine in the future if needed for elevated blood  pressure or addition of isosorbide.  HFpEF-euvolemic on exam.  Continue present dose metoprolol Lasix.  Low-sodium, heart healthy diet encouraged.  Hyperlipidemia-continue atorvastatin.  HYPERTENSION CONTROL Vitals:   06/23/22 1513 06/23/22 1515  BP: (!) 176/69 (!) 156/74    The patient's blood pressure is elevated above target today.  In order to address the patient's elevated BP: A current anti-hypertensive medication was adjusted today.        Disposition: Follow up in 3 month(s) with Shelva Majestic, MD or APP.  Follow-up set for [place] as this is closer to the patient's home.  Signed, Loel Dubonnet, NP 06/23/2022, 5:14 PM [place]

## 2022-06-23 NOTE — Patient Instructions (Signed)
Medication Instructions:  Your physician has recommended you make the following change in your medication:   Increase: Hydralazine 75mg  three times per day   *If you need a refill on your cardiac medications before your next appointment, please call your pharmacy*   Follow-Up: At [facility], you and your health needs are our priority.  As part of our continuing mission to provide you with exceptional heart care, we have created designated Provider Care Teams.  These Care Teams include your primary Cardiologist (physician) and Advanced Practice Providers (APPs -  Physician Assistants and Nurse Practitioners) who all work together to provide you with the care you need, when you need it.  We recommend signing up for the patient portal called "MyChart".  Sign up information is provided on this After Visit Summary.  MyChart is used to connect with patients for Virtual Visits (Telemedicine).  Patients are able to view lab/test results, encounter notes, upcoming appointments, etc.  Non-urgent messages can be sent to your provider as well.   To learn more about what you can do with MyChart, go to [email or web address].    Your next appointment:   Follow up as scheduled   Other Instructions Daphene Jaeger will send you a mychart message in one week to check in on your blood pressure.

## 2022-06-26 DIAGNOSIS — Z9641 Presence of insulin pump (external) (internal): Secondary | ICD-10-CM | POA: Diagnosis not present

## 2022-06-26 DIAGNOSIS — E1122 Type 2 diabetes mellitus with diabetic chronic kidney disease: Secondary | ICD-10-CM | POA: Diagnosis not present

## 2022-06-26 DIAGNOSIS — I120 Hypertensive chronic kidney disease with stage 5 chronic kidney disease or end stage renal disease: Secondary | ICD-10-CM | POA: Diagnosis not present

## 2022-06-26 DIAGNOSIS — Z4681 Encounter for fitting and adjustment of insulin pump: Secondary | ICD-10-CM | POA: Diagnosis not present

## 2022-06-26 DIAGNOSIS — R801 Persistent proteinuria, unspecified: Secondary | ICD-10-CM | POA: Diagnosis not present

## 2022-06-26 DIAGNOSIS — N179 Acute kidney failure, unspecified: Secondary | ICD-10-CM | POA: Diagnosis not present

## 2022-06-26 DIAGNOSIS — E1165 Type 2 diabetes mellitus with hyperglycemia: Secondary | ICD-10-CM | POA: Diagnosis not present

## 2022-06-26 DIAGNOSIS — Z794 Long term (current) use of insulin: Secondary | ICD-10-CM | POA: Diagnosis not present

## 2022-06-26 DIAGNOSIS — I5032 Chronic diastolic (congestive) heart failure: Secondary | ICD-10-CM | POA: Diagnosis not present

## 2022-06-26 DIAGNOSIS — N185 Chronic kidney disease, stage 5: Secondary | ICD-10-CM | POA: Diagnosis not present

## 2022-06-26 DIAGNOSIS — D631 Anemia in chronic kidney disease: Secondary | ICD-10-CM | POA: Diagnosis not present

## 2022-06-26 DIAGNOSIS — E11649 Type 2 diabetes mellitus with hypoglycemia without coma: Secondary | ICD-10-CM | POA: Diagnosis not present

## 2022-06-26 DIAGNOSIS — N189 Chronic kidney disease, unspecified: Secondary | ICD-10-CM | POA: Diagnosis not present

## 2022-06-26 LAB — BASIC METABOLIC PANEL
BUN: 51 — AB (ref 4–21)
CO2: 16 (ref 13–22)
Chloride: 105 (ref 99–108)
Creatinine: 5 — AB (ref 0.5–1.1)
Glucose: 302
Potassium: 3.9 mEq/L (ref 3.5–5.1)
Sodium: 142 (ref 137–147)

## 2022-06-26 LAB — CBC AND DIFFERENTIAL
HCT: 27 — AB (ref 36–46)
Hemoglobin: 8.9 — AB (ref 12.0–16.0)
Platelets: 458 10*3/uL — AB (ref 150–400)
WBC: 9.6

## 2022-06-26 LAB — IRON,TIBC AND FERRITIN PANEL
%SAT: 25
Ferritin: 350
Iron: 47
TIBC: 190
UIBC: 143

## 2022-06-26 LAB — CBC: RBC: 3.06 — AB (ref 3.87–5.11)

## 2022-06-26 LAB — COMPREHENSIVE METABOLIC PANEL
Albumin: 3.1 — AB (ref 3.5–5.0)
Calcium: 8.8 (ref 8.7–10.7)
eGFR: 8

## 2022-06-27 DIAGNOSIS — I480 Paroxysmal atrial fibrillation: Secondary | ICD-10-CM | POA: Diagnosis not present

## 2022-06-27 DIAGNOSIS — Z556 Problems related to health literacy: Secondary | ICD-10-CM | POA: Diagnosis not present

## 2022-06-27 DIAGNOSIS — I272 Pulmonary hypertension, unspecified: Secondary | ICD-10-CM | POA: Diagnosis not present

## 2022-06-27 DIAGNOSIS — I5032 Chronic diastolic (congestive) heart failure: Secondary | ICD-10-CM | POA: Diagnosis not present

## 2022-06-27 DIAGNOSIS — E785 Hyperlipidemia, unspecified: Secondary | ICD-10-CM | POA: Diagnosis not present

## 2022-06-27 DIAGNOSIS — Z9181 History of falling: Secondary | ICD-10-CM | POA: Diagnosis not present

## 2022-06-27 DIAGNOSIS — Z7901 Long term (current) use of anticoagulants: Secondary | ICD-10-CM | POA: Diagnosis not present

## 2022-06-27 DIAGNOSIS — I130 Hypertensive heart and chronic kidney disease with heart failure and stage 1 through stage 4 chronic kidney disease, or unspecified chronic kidney disease: Secondary | ICD-10-CM | POA: Diagnosis not present

## 2022-06-27 DIAGNOSIS — E1142 Type 2 diabetes mellitus with diabetic polyneuropathy: Secondary | ICD-10-CM | POA: Diagnosis not present

## 2022-06-27 DIAGNOSIS — Z794 Long term (current) use of insulin: Secondary | ICD-10-CM | POA: Diagnosis not present

## 2022-06-27 DIAGNOSIS — Z7985 Long-term (current) use of injectable non-insulin antidiabetic drugs: Secondary | ICD-10-CM | POA: Diagnosis not present

## 2022-06-27 DIAGNOSIS — N184 Chronic kidney disease, stage 4 (severe): Secondary | ICD-10-CM | POA: Diagnosis not present

## 2022-06-27 DIAGNOSIS — E1122 Type 2 diabetes mellitus with diabetic chronic kidney disease: Secondary | ICD-10-CM | POA: Diagnosis not present

## 2022-06-27 DIAGNOSIS — Z8673 Personal history of transient ischemic attack (TIA), and cerebral infarction without residual deficits: Secondary | ICD-10-CM | POA: Diagnosis not present

## 2022-06-27 LAB — LAB REPORT - SCANNED
Creatinine, POC: 92.6 mg/dL
EGFR: 8

## 2022-06-28 DIAGNOSIS — R262 Difficulty in walking, not elsewhere classified: Secondary | ICD-10-CM | POA: Diagnosis not present

## 2022-06-28 DIAGNOSIS — R2689 Other abnormalities of gait and mobility: Secondary | ICD-10-CM | POA: Diagnosis not present

## 2022-06-28 DIAGNOSIS — M6281 Muscle weakness (generalized): Secondary | ICD-10-CM | POA: Diagnosis not present

## 2022-06-30 ENCOUNTER — Encounter (HOSPITAL_BASED_OUTPATIENT_CLINIC_OR_DEPARTMENT_OTHER): Payer: Self-pay

## 2022-07-03 DIAGNOSIS — E785 Hyperlipidemia, unspecified: Secondary | ICD-10-CM | POA: Diagnosis not present

## 2022-07-03 DIAGNOSIS — I480 Paroxysmal atrial fibrillation: Secondary | ICD-10-CM | POA: Diagnosis not present

## 2022-07-03 DIAGNOSIS — Z9181 History of falling: Secondary | ICD-10-CM | POA: Diagnosis not present

## 2022-07-03 DIAGNOSIS — N184 Chronic kidney disease, stage 4 (severe): Secondary | ICD-10-CM | POA: Diagnosis not present

## 2022-07-03 DIAGNOSIS — I5032 Chronic diastolic (congestive) heart failure: Secondary | ICD-10-CM | POA: Diagnosis not present

## 2022-07-03 DIAGNOSIS — Z8673 Personal history of transient ischemic attack (TIA), and cerebral infarction without residual deficits: Secondary | ICD-10-CM | POA: Diagnosis not present

## 2022-07-03 DIAGNOSIS — E1122 Type 2 diabetes mellitus with diabetic chronic kidney disease: Secondary | ICD-10-CM | POA: Diagnosis not present

## 2022-07-03 DIAGNOSIS — I130 Hypertensive heart and chronic kidney disease with heart failure and stage 1 through stage 4 chronic kidney disease, or unspecified chronic kidney disease: Secondary | ICD-10-CM | POA: Diagnosis not present

## 2022-07-03 DIAGNOSIS — Z556 Problems related to health literacy: Secondary | ICD-10-CM | POA: Diagnosis not present

## 2022-07-03 DIAGNOSIS — Z794 Long term (current) use of insulin: Secondary | ICD-10-CM | POA: Diagnosis not present

## 2022-07-03 DIAGNOSIS — Z7901 Long term (current) use of anticoagulants: Secondary | ICD-10-CM | POA: Diagnosis not present

## 2022-07-03 DIAGNOSIS — Z7985 Long-term (current) use of injectable non-insulin antidiabetic drugs: Secondary | ICD-10-CM | POA: Diagnosis not present

## 2022-07-03 DIAGNOSIS — E1142 Type 2 diabetes mellitus with diabetic polyneuropathy: Secondary | ICD-10-CM | POA: Diagnosis not present

## 2022-07-03 DIAGNOSIS — I272 Pulmonary hypertension, unspecified: Secondary | ICD-10-CM | POA: Diagnosis not present

## 2022-07-04 ENCOUNTER — Telehealth: Payer: Self-pay | Admitting: Pharmacy Technician

## 2022-07-04 ENCOUNTER — Other Ambulatory Visit: Payer: Self-pay

## 2022-07-04 ENCOUNTER — Telehealth: Payer: Self-pay | Admitting: Cardiovascular Disease

## 2022-07-04 DIAGNOSIS — E785 Hyperlipidemia, unspecified: Secondary | ICD-10-CM | POA: Diagnosis not present

## 2022-07-04 DIAGNOSIS — I272 Pulmonary hypertension, unspecified: Secondary | ICD-10-CM | POA: Diagnosis not present

## 2022-07-04 DIAGNOSIS — Z7985 Long-term (current) use of injectable non-insulin antidiabetic drugs: Secondary | ICD-10-CM | POA: Diagnosis not present

## 2022-07-04 DIAGNOSIS — I130 Hypertensive heart and chronic kidney disease with heart failure and stage 1 through stage 4 chronic kidney disease, or unspecified chronic kidney disease: Secondary | ICD-10-CM | POA: Diagnosis not present

## 2022-07-04 DIAGNOSIS — Z794 Long term (current) use of insulin: Secondary | ICD-10-CM | POA: Diagnosis not present

## 2022-07-04 DIAGNOSIS — I480 Paroxysmal atrial fibrillation: Secondary | ICD-10-CM | POA: Diagnosis not present

## 2022-07-04 DIAGNOSIS — I5032 Chronic diastolic (congestive) heart failure: Secondary | ICD-10-CM | POA: Diagnosis not present

## 2022-07-04 DIAGNOSIS — Z556 Problems related to health literacy: Secondary | ICD-10-CM | POA: Diagnosis not present

## 2022-07-04 DIAGNOSIS — E1142 Type 2 diabetes mellitus with diabetic polyneuropathy: Secondary | ICD-10-CM | POA: Diagnosis not present

## 2022-07-04 DIAGNOSIS — Z7901 Long term (current) use of anticoagulants: Secondary | ICD-10-CM | POA: Diagnosis not present

## 2022-07-04 DIAGNOSIS — Z8673 Personal history of transient ischemic attack (TIA), and cerebral infarction without residual deficits: Secondary | ICD-10-CM | POA: Diagnosis not present

## 2022-07-04 DIAGNOSIS — Z9181 History of falling: Secondary | ICD-10-CM | POA: Diagnosis not present

## 2022-07-04 DIAGNOSIS — E1122 Type 2 diabetes mellitus with diabetic chronic kidney disease: Secondary | ICD-10-CM | POA: Diagnosis not present

## 2022-07-04 DIAGNOSIS — N184 Chronic kidney disease, stage 4 (severe): Secondary | ICD-10-CM | POA: Diagnosis not present

## 2022-07-04 NOTE — Telephone Encounter (Signed)
Okay to provide verbal orders for home health.   Loel Dubonnet, NP

## 2022-07-04 NOTE — Telephone Encounter (Signed)
Dr. Verneda Skill, Juluis Rainier note:  Auth Submission: NO AUTH NEEDED Site of care: Site of care: [facility] Payer: [name] [place]IC[facility] Medication & CPT/J Code(s) submitted: Feraheme (ferumoxytol) [id number] Route of submission (phone, fax, portal): PORTAL Phone # Fax # Auth type: Buy/Bill Units/visits requested: 2 Reference number:  Approval from: 07/04/22 to 11/03/22   Patient will be scheduled as soon as possible

## 2022-07-04 NOTE — Telephone Encounter (Signed)
[name] [name] received verbal orders.

## 2022-07-04 NOTE — Telephone Encounter (Signed)
Dr. Verneda Skill, Juluis Rainier note:  Auth Submission: NO AUTH NEEDED Site of care: Site of care: [facility] Payer: [facility] MEDICARE Medication & CPT/J Code(s) submitted: RETACRIT - [id number] Route of submission (phone, fax, portal): PORTAL Phone # Fax # Auth type: Buy/Bill Units/visits requested: 30,000 UNITS Q4WKS Reference [id number] - predetermination approval via portal Approval from: 07/04/22 to 07/04/23

## 2022-07-04 NOTE — Telephone Encounter (Signed)
Roselyn Reef with [facility] calling for verbal orders for skilled nursing for home health (2 week -  two and one week - 7).   She states if no answer, you may LVM with verbal order, first and last name and credentials.

## 2022-07-10 ENCOUNTER — Encounter (HOSPITAL_COMMUNITY): Payer: Medicare Other

## 2022-07-10 ENCOUNTER — Encounter (HOSPITAL_COMMUNITY)
Admission: RE | Admit: 2022-07-10 | Discharge: 2022-07-10 | Disposition: A | Discharge status: Home / Self Care | Payer: Medicare Other | Source: Ambulatory Visit | Attending: Nephrology | Admitting: Nephrology

## 2022-07-10 VITALS — BP 175/69 | HR 78 | Temp 98.3°F | Resp 16

## 2022-07-10 DIAGNOSIS — D631 Anemia in chronic kidney disease: Secondary | ICD-10-CM | POA: Insufficient documentation

## 2022-07-10 DIAGNOSIS — N189 Chronic kidney disease, unspecified: Secondary | ICD-10-CM | POA: Insufficient documentation

## 2022-07-10 LAB — CBC WITH DIFFERENTIAL/PLATELET
Abs Immature Granulocytes: 0.03 10*3/uL (ref 0.00–0.07)
Basophils Absolute: 0.1 10*3/uL (ref 0.0–0.1)
Basophils Relative: 1 %
Eosinophils Absolute: 0.3 10*3/uL (ref 0.0–0.5)
Eosinophils Relative: 3 %
HCT: 24.5 % — ABNORMAL LOW (ref 36.0–46.0)
Hemoglobin: 7.9 g/dL — ABNORMAL LOW (ref 12.0–15.0)
Immature Granulocytes: 0 %
Lymphocytes Relative: 18 %
Lymphs Abs: 1.6 10*3/uL (ref 0.7–4.0)
MCH: 29.3 pg (ref 26.0–34.0)
MCHC: 32.2 g/dL (ref 30.0–36.0)
MCV: 90.7 fL (ref 80.0–100.0)
Monocytes Absolute: 0.9 10*3/uL (ref 0.1–1.0)
Monocytes Relative: 10 %
Neutro Abs: 6.1 10*3/uL (ref 1.7–7.7)
Neutrophils Relative %: 68 %
Platelets: 332 10*3/uL (ref 150–400)
RBC: 2.7 MIL/uL — ABNORMAL LOW (ref 3.87–5.11)
RDW: 13.7 % (ref 11.5–15.5)
WBC: 8.9 10*3/uL (ref 4.0–10.5)
nRBC: 0 % (ref 0.0–0.2)

## 2022-07-10 LAB — FERRITIN: Ferritin: 146 ng/mL (ref 11–307)

## 2022-07-10 LAB — RENAL FUNCTION PANEL
Albumin: 2.5 g/dL — ABNORMAL LOW (ref 3.5–5.0)
Anion gap: 9 (ref 5–15)
BUN: 50 mg/dL — ABNORMAL HIGH (ref 8–23)
CO2: 21 mmol/L — ABNORMAL LOW (ref 22–32)
Calcium: 8.1 mg/dL — ABNORMAL LOW (ref 8.9–10.3)
Chloride: 103 mmol/L (ref 98–111)
Creatinine, Ser: 4.7 mg/dL — ABNORMAL HIGH (ref 0.44–1.00)
GFR, Estimated: 9 mL/min — ABNORMAL LOW (ref >60)
Glucose, Bld: 328 mg/dL — ABNORMAL HIGH (ref 70–99)
Phosphorus: 5.2 mg/dL — ABNORMAL HIGH (ref 2.5–4.6)
Potassium: 3.7 mmol/L (ref 3.5–5.1)
Sodium: 133 mmol/L — ABNORMAL LOW (ref 135–145)

## 2022-07-10 LAB — IRON AND TIBC
Iron: 39 ug/dL (ref 28–170)
Saturation Ratios: 17 % (ref 10.4–31.8)
TIBC: 227 ug/dL — ABNORMAL LOW (ref 250–450)
UIBC: 188 ug/dL

## 2022-07-10 LAB — POCT HEMOGLOBIN-HEMACUE: Hemoglobin: 7.6 g/dL — ABNORMAL LOW (ref 12.0–15.0)

## 2022-07-10 MED ORDER — EPOETIN ALFA-EPBX 40000 UNIT/ML IJ SOLN
30000.0000 [IU] | Freq: Once | INTRAMUSCULAR | Status: AC
  Administered 2022-07-10: 30000 [IU] via SUBCUTANEOUS

## 2022-07-10 MED ORDER — CLONIDINE HCL 0.1 MG PO TABS: 0.1000 mg | ORAL_TABLET | ORAL | Status: DC | PRN

## 2022-07-10 NOTE — Progress Notes (Signed)
Diagnosis: Anemia in Chronic Kidney Disease  Provider:  Donato Heinz MD  Procedure: Injection  Retacrit (epoetin alfa-epbx), Dose: 30,000 units , Site: subcutaneous, Number of injections: 1  Hgb 7.6  Post Care: Observation period completed  Discharge: Condition: Good, Destination: Home . AVS Provided  Performed by:  Binnie Kand, RN

## 2022-07-11 DIAGNOSIS — Z7985 Long-term (current) use of injectable non-insulin antidiabetic drugs: Secondary | ICD-10-CM | POA: Diagnosis not present

## 2022-07-11 DIAGNOSIS — I130 Hypertensive heart and chronic kidney disease with heart failure and stage 1 through stage 4 chronic kidney disease, or unspecified chronic kidney disease: Secondary | ICD-10-CM | POA: Diagnosis not present

## 2022-07-11 DIAGNOSIS — Z794 Long term (current) use of insulin: Secondary | ICD-10-CM | POA: Diagnosis not present

## 2022-07-11 DIAGNOSIS — I5032 Chronic diastolic (congestive) heart failure: Secondary | ICD-10-CM | POA: Diagnosis not present

## 2022-07-11 DIAGNOSIS — E1142 Type 2 diabetes mellitus with diabetic polyneuropathy: Secondary | ICD-10-CM | POA: Diagnosis not present

## 2022-07-11 DIAGNOSIS — Z8673 Personal history of transient ischemic attack (TIA), and cerebral infarction without residual deficits: Secondary | ICD-10-CM | POA: Diagnosis not present

## 2022-07-11 DIAGNOSIS — E1122 Type 2 diabetes mellitus with diabetic chronic kidney disease: Secondary | ICD-10-CM | POA: Diagnosis not present

## 2022-07-11 DIAGNOSIS — Z556 Problems related to health literacy: Secondary | ICD-10-CM | POA: Diagnosis not present

## 2022-07-11 DIAGNOSIS — I272 Pulmonary hypertension, unspecified: Secondary | ICD-10-CM | POA: Diagnosis not present

## 2022-07-11 DIAGNOSIS — E785 Hyperlipidemia, unspecified: Secondary | ICD-10-CM | POA: Diagnosis not present

## 2022-07-11 DIAGNOSIS — I480 Paroxysmal atrial fibrillation: Secondary | ICD-10-CM | POA: Diagnosis not present

## 2022-07-11 DIAGNOSIS — Z7901 Long term (current) use of anticoagulants: Secondary | ICD-10-CM | POA: Diagnosis not present

## 2022-07-11 DIAGNOSIS — N184 Chronic kidney disease, stage 4 (severe): Secondary | ICD-10-CM | POA: Diagnosis not present

## 2022-07-11 DIAGNOSIS — Z9181 History of falling: Secondary | ICD-10-CM | POA: Diagnosis not present

## 2022-07-12 ENCOUNTER — Telehealth: Payer: Self-pay

## 2022-07-12 DIAGNOSIS — E1122 Type 2 diabetes mellitus with diabetic chronic kidney disease: Secondary | ICD-10-CM | POA: Diagnosis not present

## 2022-07-12 DIAGNOSIS — I5032 Chronic diastolic (congestive) heart failure: Secondary | ICD-10-CM | POA: Diagnosis not present

## 2022-07-12 DIAGNOSIS — E1142 Type 2 diabetes mellitus with diabetic polyneuropathy: Secondary | ICD-10-CM | POA: Diagnosis not present

## 2022-07-12 DIAGNOSIS — I272 Pulmonary hypertension, unspecified: Secondary | ICD-10-CM | POA: Diagnosis not present

## 2022-07-12 DIAGNOSIS — I130 Hypertensive heart and chronic kidney disease with heart failure and stage 1 through stage 4 chronic kidney disease, or unspecified chronic kidney disease: Secondary | ICD-10-CM | POA: Diagnosis not present

## 2022-07-12 DIAGNOSIS — Z7901 Long term (current) use of anticoagulants: Secondary | ICD-10-CM | POA: Diagnosis not present

## 2022-07-12 DIAGNOSIS — Z8673 Personal history of transient ischemic attack (TIA), and cerebral infarction without residual deficits: Secondary | ICD-10-CM | POA: Diagnosis not present

## 2022-07-12 DIAGNOSIS — Z794 Long term (current) use of insulin: Secondary | ICD-10-CM | POA: Diagnosis not present

## 2022-07-12 DIAGNOSIS — Z7985 Long-term (current) use of injectable non-insulin antidiabetic drugs: Secondary | ICD-10-CM | POA: Diagnosis not present

## 2022-07-12 DIAGNOSIS — Z556 Problems related to health literacy: Secondary | ICD-10-CM | POA: Diagnosis not present

## 2022-07-12 DIAGNOSIS — N184 Chronic kidney disease, stage 4 (severe): Secondary | ICD-10-CM | POA: Diagnosis not present

## 2022-07-12 DIAGNOSIS — E785 Hyperlipidemia, unspecified: Secondary | ICD-10-CM | POA: Diagnosis not present

## 2022-07-12 DIAGNOSIS — Z9181 History of falling: Secondary | ICD-10-CM | POA: Diagnosis not present

## 2022-07-12 DIAGNOSIS — I480 Paroxysmal atrial fibrillation: Secondary | ICD-10-CM | POA: Diagnosis not present

## 2022-07-12 NOTE — Progress Notes (Signed)
Care Management & Coordination Services Pharmacy Team  Reason for Encounter: Medication coordination and delivery  Contacted patient to discuss medications and coordinate delivery from [facility]. Spoke with family on 07/12/2022  Cycle dispensing form sent to [name] for review.   Last adherence delivery date: 06-22-2022  Patient is due for next adherence delivery on: 07-24-2022  This delivery to include: Vials  30 Days  Atorvastatin 20 mg daily  Cetirizine 10 mg daily  Metoprolol 25 mg 1/2 tablet daily   Amlodipine 5 mg daily  Hydroxyzine 25 mg 1-2 tabs at bedtime PRN  Insulin lispro use as directed with insulin pump Lasix 20 mg daily PRN Aspirin 81 mg daily Omnipods for insulin Pepcid  Patient declined the following medications this month: Hydralazine- Plenty supply [place]- doesn't use Latanoprost- plenty supply  Pen needles- doesn't need Ozempic- plenty supply due to patient missing a few doses on patient's behalf. Patient was confused and thought she wasn't suppose to take ozempic. Patient's daughter stated she is helping her to remember to take it.  Refills requested from providers include: Insulin lispro- request sent to [facility] by [facility]  Confirmed delivery date of 07-24-2022, advised patient that pharmacy will contact them the morning of delivery.   Any concerns about your medications? No  How often do you forget or accidentally miss a dose? Once a week  Do you use a pillbox? Yes  Is patient in packaging No   Recent blood pressure readings are as follows: Patient's daughter stated last home blood pressure she can remember is 160/90 and that was right after hydralazine increase. Patient's last office BP was 07-10-2022 175/69. Will call patient next week to get more readings.  Recent blood glucose readings are as follows: Patient's daughter stated blood sugars has been dropping at night to around 54 but ranging round 125 during the day. Insulin  pump amount has been adjusted.    Chart review: Recent office visits:  None  Recent consult visits:  07-10-2022 Donato Heinz, MD. SQ/IM injection given  06-26-2022 Christeen Douglas, RD (Diabetes education). Follow up visit for insulin pump.  06-23-2022 Loel Dubonnet, NP (Cardiology). Increase hydralazine 50 mg TID to 75 mg TID.  06-23-2022 Hensel, Sharrell Ku, OD (Optometry). Initial visit for Refraction Both Eyes.  06-19-2022 Christeen Douglas, RD (Diabetes education). Follow up visit for insulin pump.  Hospital visits:  Medication Reconciliation was completed by comparing discharge summary, patient's EMR and Pharmacy list, and upon discussion with patient.  Admitted to the hospital on 06-15-2022 due to Hyperglycemia. Discharge date was 06-16-2022. Discharged from [place]?Medications Started at [facility] Discharge:?? None  Medication Changes at Hospital Discharge: None  Medications Discontinued at Hospital Discharge: None  Medications that remain the same after Hospital Discharge:??  -All other medications will remain the same.    Hospital visits:  Medication Reconciliation was completed by comparing discharge summary, patient's EMR and Pharmacy list, and upon discussion with patient.   Admitted to the hospital on 06-02-2022 due to Chest pain. Discharge date was 06-02-2022. Discharged from [place]?Medications Started at [facility] Discharge:?? None   Medication Changes at Hospital Discharge: None   Medications Discontinued at Hospital Discharge: None   Medications that remain the same after Hospital Discharge:??  -All other medications will remain the same.     Hospital visits:  Medication Reconciliation was completed by comparing discharge summary, patient's EMR and Pharmacy list, and upon discussion with patient.  Admitted to the hospital on 05-16-2022 due to Swelling of right hand. Discharge date was  05-16-2022. Discharged from [facility] health urgent care at [facility]?Medications Started at [facility] Discharge:?? None   Medication Changes at Hospital Discharge: None   Medications Discontinued at Hospital Discharge: None   Medications that remain the same after Hospital Discharge:??  -All other medications will remain the same.    Medications: Outpatient Encounter Medications as of 07/12/2022  Medication Sig Note   ACCU-CHEK AVIVA PLUS test strip TEST THREE TIMES DAILY AS DIRECTED    amLODipine (NORVASC) 5 MG tablet Take 1 tablet (5 mg total) by mouth daily.    apixaban (ELIQUIS) 2.5 MG TABS tablet Take 1 tablet (2.5 mg total) by mouth 2 (two) times daily.    atorvastatin (LIPITOR) 20 MG tablet Take 1 tablet (20 mg total) by mouth daily.    azelastine (ASTELIN) 0.1 % nasal spray Place 1 spray into both nostrils 2 (two) times daily. Use in each nostril as directed    B-D UF III MINI PEN NEEDLES 31G X 5 MM MISC Use as directed 4x a day    blood glucose meter kit and supplies Dispense based on patient and insurance preference. Use up to four times daily as directed.dx code e11.65    Blood Glucose Monitoring Suppl (TRUE METRIX METER) DEVI Use to check blood sugars 3 times a day.    cetirizine (ZYRTEC ALLERGY) 10 MG tablet Take 1 tablet (10 mg total) by mouth daily.    Cholecalciferol (VITAMIN D3) 50 MCG (2000 UT) TABS Take 1 tablet by mouth daily at 12 noon.    Ferrous Sulfate (IRON PO) Take 65 mg by mouth daily.    furosemide (LASIX) 20 MG tablet Take 1 tablet (20 mg total) by mouth as needed (for swelling once daily).    glucose blood (TRUE METRIX BLOOD GLUCOSE TEST) test strip Use as instructed to check blood sugar 3 times a day. Dx code e11.65    hydrALAZINE (APRESOLINE) 50 MG tablet Take 1.5 tablets (75 mg total) by mouth 3 (three) times daily.    hydrOXYzine (ATARAX) 25 MG tablet Take 1 tablet (25 mg total) by mouth 3 (three) times daily as needed for itching.    insulin lispro  (HUMALOG KWIKPEN) 200 UNIT/ML KwikPen inject 8-10 units into THE SKIN 15 minutes BEFORE THREE main meals of THE DAY    Lancets (ACCU-CHEK SOFT TOUCH) lancets Check blood sugars 3 times daily E11.65    latanoprost (XALATAN) 0.005 % ophthalmic solution Place 1 drop into both eyes at bedtime.     metoprolol succinate (TOPROL-XL) 25 MG 24 hr tablet Take 0.5 tablets (12.5 mg total) by mouth daily.    Multiple Vitamin (MULTIVITAMIN) tablet Take 1 tablet by mouth daily.    Semaglutide, 1 MG/DOSE, (OZEMPIC, 1 MG/DOSE,) 2 MG/1.5ML SOPN Inject 1 mg into the skin once a week. 09/02/2021: Fridays   TRESIBA FLEXTOUCH 200 UNIT/ML FlexTouch Pen inject 20 units into THE SKIN daily    No facility-administered encounter medications on file as of 07/12/2022.   BP Readings from Last 3 Encounters:  07/10/22 (!) 175/69  06/23/22 (!) 156/74  06/15/22 (!) 191/70    Pulse Readings from Last 3 Encounters:  07/10/22 78  06/23/22 73  06/15/22 70    Lab Results  Component Value Date/Time   HGBA1C 8.4 (H) 03/22/2022 09:48 AM   HGBA1C 7.2 (H) 11/29/2021 02:23 PM   Lab Results  Component Value Date  CREATININE 4.70 (H) 07/10/2022   BUN 50 (H) 07/10/2022   GFRNONAA 9 (L) 07/10/2022   GFRAA 20 (L) 03/15/2020   NA 133 (L) 07/10/2022   K 3.7 07/10/2022   CALCIUM 8.1 (L) 07/10/2022   CO2 21 (L) 07/10/2022     [place] Clinical Pharmacist Assistant [phone]

## 2022-07-14 DIAGNOSIS — Z7901 Long term (current) use of anticoagulants: Secondary | ICD-10-CM | POA: Diagnosis not present

## 2022-07-14 DIAGNOSIS — Z9181 History of falling: Secondary | ICD-10-CM | POA: Diagnosis not present

## 2022-07-14 DIAGNOSIS — E1122 Type 2 diabetes mellitus with diabetic chronic kidney disease: Secondary | ICD-10-CM | POA: Diagnosis not present

## 2022-07-14 DIAGNOSIS — E785 Hyperlipidemia, unspecified: Secondary | ICD-10-CM | POA: Diagnosis not present

## 2022-07-14 DIAGNOSIS — I480 Paroxysmal atrial fibrillation: Secondary | ICD-10-CM | POA: Diagnosis not present

## 2022-07-14 DIAGNOSIS — Z8673 Personal history of transient ischemic attack (TIA), and cerebral infarction without residual deficits: Secondary | ICD-10-CM | POA: Diagnosis not present

## 2022-07-14 DIAGNOSIS — Z556 Problems related to health literacy: Secondary | ICD-10-CM | POA: Diagnosis not present

## 2022-07-14 DIAGNOSIS — E1142 Type 2 diabetes mellitus with diabetic polyneuropathy: Secondary | ICD-10-CM | POA: Diagnosis not present

## 2022-07-14 DIAGNOSIS — I130 Hypertensive heart and chronic kidney disease with heart failure and stage 1 through stage 4 chronic kidney disease, or unspecified chronic kidney disease: Secondary | ICD-10-CM | POA: Diagnosis not present

## 2022-07-14 DIAGNOSIS — N184 Chronic kidney disease, stage 4 (severe): Secondary | ICD-10-CM | POA: Diagnosis not present

## 2022-07-14 DIAGNOSIS — Z7985 Long-term (current) use of injectable non-insulin antidiabetic drugs: Secondary | ICD-10-CM | POA: Diagnosis not present

## 2022-07-14 DIAGNOSIS — Z794 Long term (current) use of insulin: Secondary | ICD-10-CM | POA: Diagnosis not present

## 2022-07-14 DIAGNOSIS — I5032 Chronic diastolic (congestive) heart failure: Secondary | ICD-10-CM | POA: Diagnosis not present

## 2022-07-14 DIAGNOSIS — I272 Pulmonary hypertension, unspecified: Secondary | ICD-10-CM | POA: Diagnosis not present

## 2022-07-19 DIAGNOSIS — Z9181 History of falling: Secondary | ICD-10-CM | POA: Diagnosis not present

## 2022-07-19 DIAGNOSIS — E1142 Type 2 diabetes mellitus with diabetic polyneuropathy: Secondary | ICD-10-CM | POA: Diagnosis not present

## 2022-07-19 DIAGNOSIS — I5032 Chronic diastolic (congestive) heart failure: Secondary | ICD-10-CM | POA: Diagnosis not present

## 2022-07-19 DIAGNOSIS — Z556 Problems related to health literacy: Secondary | ICD-10-CM | POA: Diagnosis not present

## 2022-07-19 DIAGNOSIS — I480 Paroxysmal atrial fibrillation: Secondary | ICD-10-CM | POA: Diagnosis not present

## 2022-07-19 DIAGNOSIS — I272 Pulmonary hypertension, unspecified: Secondary | ICD-10-CM | POA: Diagnosis not present

## 2022-07-19 DIAGNOSIS — Z794 Long term (current) use of insulin: Secondary | ICD-10-CM | POA: Diagnosis not present

## 2022-07-19 DIAGNOSIS — Z7901 Long term (current) use of anticoagulants: Secondary | ICD-10-CM | POA: Diagnosis not present

## 2022-07-19 DIAGNOSIS — I130 Hypertensive heart and chronic kidney disease with heart failure and stage 1 through stage 4 chronic kidney disease, or unspecified chronic kidney disease: Secondary | ICD-10-CM | POA: Diagnosis not present

## 2022-07-19 DIAGNOSIS — E1122 Type 2 diabetes mellitus with diabetic chronic kidney disease: Secondary | ICD-10-CM | POA: Diagnosis not present

## 2022-07-19 DIAGNOSIS — E785 Hyperlipidemia, unspecified: Secondary | ICD-10-CM | POA: Diagnosis not present

## 2022-07-19 DIAGNOSIS — N184 Chronic kidney disease, stage 4 (severe): Secondary | ICD-10-CM | POA: Diagnosis not present

## 2022-07-19 DIAGNOSIS — Z8673 Personal history of transient ischemic attack (TIA), and cerebral infarction without residual deficits: Secondary | ICD-10-CM | POA: Diagnosis not present

## 2022-07-19 DIAGNOSIS — Z7985 Long-term (current) use of injectable non-insulin antidiabetic drugs: Secondary | ICD-10-CM | POA: Diagnosis not present

## 2022-07-22 DIAGNOSIS — I5032 Chronic diastolic (congestive) heart failure: Secondary | ICD-10-CM | POA: Diagnosis not present

## 2022-07-22 DIAGNOSIS — Z7985 Long-term (current) use of injectable non-insulin antidiabetic drugs: Secondary | ICD-10-CM | POA: Diagnosis not present

## 2022-07-22 DIAGNOSIS — Z9181 History of falling: Secondary | ICD-10-CM | POA: Diagnosis not present

## 2022-07-22 DIAGNOSIS — I272 Pulmonary hypertension, unspecified: Secondary | ICD-10-CM | POA: Diagnosis not present

## 2022-07-22 DIAGNOSIS — E1122 Type 2 diabetes mellitus with diabetic chronic kidney disease: Secondary | ICD-10-CM | POA: Diagnosis not present

## 2022-07-22 DIAGNOSIS — N184 Chronic kidney disease, stage 4 (severe): Secondary | ICD-10-CM | POA: Diagnosis not present

## 2022-07-22 DIAGNOSIS — Z794 Long term (current) use of insulin: Secondary | ICD-10-CM | POA: Diagnosis not present

## 2022-07-22 DIAGNOSIS — I130 Hypertensive heart and chronic kidney disease with heart failure and stage 1 through stage 4 chronic kidney disease, or unspecified chronic kidney disease: Secondary | ICD-10-CM | POA: Diagnosis not present

## 2022-07-22 DIAGNOSIS — E785 Hyperlipidemia, unspecified: Secondary | ICD-10-CM | POA: Diagnosis not present

## 2022-07-22 DIAGNOSIS — Z8673 Personal history of transient ischemic attack (TIA), and cerebral infarction without residual deficits: Secondary | ICD-10-CM | POA: Diagnosis not present

## 2022-07-22 DIAGNOSIS — Z7901 Long term (current) use of anticoagulants: Secondary | ICD-10-CM | POA: Diagnosis not present

## 2022-07-22 DIAGNOSIS — I480 Paroxysmal atrial fibrillation: Secondary | ICD-10-CM | POA: Diagnosis not present

## 2022-07-22 DIAGNOSIS — E1142 Type 2 diabetes mellitus with diabetic polyneuropathy: Secondary | ICD-10-CM | POA: Diagnosis not present

## 2022-07-22 DIAGNOSIS — Z556 Problems related to health literacy: Secondary | ICD-10-CM | POA: Diagnosis not present

## 2022-07-24 ENCOUNTER — Encounter (HOSPITAL_COMMUNITY)
Admission: RE | Admit: 2022-07-24 | Discharge: 2022-07-24 | Disposition: A | Discharge status: Home / Self Care | Payer: Medicare Other | Source: Ambulatory Visit | Attending: Nephrology | Admitting: Nephrology

## 2022-07-24 VITALS — BP 190/76 | HR 72 | Temp 98.3°F | Resp 16

## 2022-07-24 DIAGNOSIS — I5032 Chronic diastolic (congestive) heart failure: Secondary | ICD-10-CM | POA: Diagnosis not present

## 2022-07-24 DIAGNOSIS — E1122 Type 2 diabetes mellitus with diabetic chronic kidney disease: Secondary | ICD-10-CM | POA: Diagnosis not present

## 2022-07-24 DIAGNOSIS — R801 Persistent proteinuria, unspecified: Secondary | ICD-10-CM | POA: Diagnosis not present

## 2022-07-24 DIAGNOSIS — D631 Anemia in chronic kidney disease: Secondary | ICD-10-CM | POA: Diagnosis not present

## 2022-07-24 DIAGNOSIS — N184 Chronic kidney disease, stage 4 (severe): Secondary | ICD-10-CM

## 2022-07-24 DIAGNOSIS — N179 Acute kidney failure, unspecified: Secondary | ICD-10-CM | POA: Diagnosis not present

## 2022-07-24 DIAGNOSIS — I120 Hypertensive chronic kidney disease with stage 5 chronic kidney disease or end stage renal disease: Secondary | ICD-10-CM | POA: Diagnosis not present

## 2022-07-24 DIAGNOSIS — N189 Chronic kidney disease, unspecified: Secondary | ICD-10-CM | POA: Diagnosis not present

## 2022-07-24 DIAGNOSIS — N185 Chronic kidney disease, stage 5: Secondary | ICD-10-CM | POA: Diagnosis not present

## 2022-07-24 MED ORDER — DIPHENHYDRAMINE HCL 25 MG PO CAPS: 25.0000 mg | ORAL_CAPSULE | Freq: Once | ORAL | Status: DC

## 2022-07-24 MED ORDER — SODIUM CHLORIDE 0.9 % IV SOLN
510.0000 mg | Freq: Once | INTRAVENOUS | Status: AC
  Administered 2022-07-24: 510 mg via INTRAVENOUS
  Filled 2022-07-24: qty 510

## 2022-07-24 MED ORDER — ACETAMINOPHEN 325 MG PO TABS: 650.0000 mg | ORAL_TABLET | Freq: Once | ORAL | Status: DC

## 2022-07-24 NOTE — Progress Notes (Signed)
Diagnosis: Anemia in Chronic Kidney Disease  Provider:  Terrial Rhodes MD  Procedure: Infusion  IV Type: Peripheral, IV Location: L Antecubital  Feraheme (Ferumoxytol), Dose: 510 mg  Infusion Start Time: 1419  Infusion Stop Time: 1436  Post Infusion IV Care: Patient declined observation and Peripheral IV Discontinued  Discharge: Condition: Good, Destination: Home . AVS Provided  Performed by:  Evelena Peat, RN

## 2022-07-25 DIAGNOSIS — E119 Type 2 diabetes mellitus without complications: Secondary | ICD-10-CM | POA: Diagnosis not present

## 2022-07-26 DIAGNOSIS — I130 Hypertensive heart and chronic kidney disease with heart failure and stage 1 through stage 4 chronic kidney disease, or unspecified chronic kidney disease: Secondary | ICD-10-CM | POA: Diagnosis not present

## 2022-07-26 DIAGNOSIS — Z794 Long term (current) use of insulin: Secondary | ICD-10-CM | POA: Diagnosis not present

## 2022-07-26 DIAGNOSIS — Z7901 Long term (current) use of anticoagulants: Secondary | ICD-10-CM | POA: Diagnosis not present

## 2022-07-26 DIAGNOSIS — E1142 Type 2 diabetes mellitus with diabetic polyneuropathy: Secondary | ICD-10-CM | POA: Diagnosis not present

## 2022-07-26 DIAGNOSIS — Z556 Problems related to health literacy: Secondary | ICD-10-CM | POA: Diagnosis not present

## 2022-07-26 DIAGNOSIS — I5032 Chronic diastolic (congestive) heart failure: Secondary | ICD-10-CM | POA: Diagnosis not present

## 2022-07-26 DIAGNOSIS — E1122 Type 2 diabetes mellitus with diabetic chronic kidney disease: Secondary | ICD-10-CM | POA: Diagnosis not present

## 2022-07-26 DIAGNOSIS — I272 Pulmonary hypertension, unspecified: Secondary | ICD-10-CM | POA: Diagnosis not present

## 2022-07-26 DIAGNOSIS — Z7985 Long-term (current) use of injectable non-insulin antidiabetic drugs: Secondary | ICD-10-CM | POA: Diagnosis not present

## 2022-07-26 DIAGNOSIS — I480 Paroxysmal atrial fibrillation: Secondary | ICD-10-CM | POA: Diagnosis not present

## 2022-07-26 DIAGNOSIS — Z8673 Personal history of transient ischemic attack (TIA), and cerebral infarction without residual deficits: Secondary | ICD-10-CM | POA: Diagnosis not present

## 2022-07-26 DIAGNOSIS — E785 Hyperlipidemia, unspecified: Secondary | ICD-10-CM | POA: Diagnosis not present

## 2022-07-26 DIAGNOSIS — N184 Chronic kidney disease, stage 4 (severe): Secondary | ICD-10-CM | POA: Diagnosis not present

## 2022-07-26 DIAGNOSIS — Z9181 History of falling: Secondary | ICD-10-CM | POA: Diagnosis not present

## 2022-07-27 ENCOUNTER — Other Ambulatory Visit: Payer: Self-pay

## 2022-07-27 DIAGNOSIS — H4052X3 Glaucoma secondary to other eye disorders, left eye, severe stage: Secondary | ICD-10-CM | POA: Diagnosis not present

## 2022-07-27 DIAGNOSIS — H34812 Central retinal vein occlusion, left eye, with macular edema: Secondary | ICD-10-CM | POA: Diagnosis not present

## 2022-07-27 DIAGNOSIS — Z794 Long term (current) use of insulin: Secondary | ICD-10-CM | POA: Diagnosis not present

## 2022-07-27 DIAGNOSIS — E113311 Type 2 diabetes mellitus with moderate nonproliferative diabetic retinopathy with macular edema, right eye: Secondary | ICD-10-CM | POA: Diagnosis not present

## 2022-07-27 DIAGNOSIS — H401133 Primary open-angle glaucoma, bilateral, severe stage: Secondary | ICD-10-CM | POA: Diagnosis not present

## 2022-07-27 DIAGNOSIS — H5462 Unqualified visual loss, left eye, normal vision right eye: Secondary | ICD-10-CM | POA: Diagnosis not present

## 2022-07-29 DIAGNOSIS — R2689 Other abnormalities of gait and mobility: Secondary | ICD-10-CM | POA: Diagnosis not present

## 2022-07-29 DIAGNOSIS — M6281 Muscle weakness (generalized): Secondary | ICD-10-CM | POA: Diagnosis not present

## 2022-07-29 DIAGNOSIS — R262 Difficulty in walking, not elsewhere classified: Secondary | ICD-10-CM | POA: Diagnosis not present

## 2022-07-31 ENCOUNTER — Encounter: Payer: Medicare Other | Admitting: Internal Medicine

## 2022-07-31 NOTE — Progress Notes (Deleted)
Subjective:     Patient ID: [name] [name] , female    DOB: [birth year] , 82 y.o.   MRN: [id number]   Chief Complaint  Patient presents with   Hypertension    HPI  Pt presents today for bpc. She reports compliance with medications. Denies headache, chest pain, SOB.       Hypertension This is a chronic problem. The current episode started more than 1 year ago. The problem has been gradually improving since onset. The problem is controlled. Risk factors for coronary artery disease include diabetes mellitus, dyslipidemia, post-menopausal state and sedentary lifestyle. The current treatment provides moderate improvement. Compliance problems include exercise.  Hypertensive end-organ damage includes kidney disease.  Diabetes She presents for her follow-up diabetic visit. She has type 2 diabetes mellitus. There are no hypoglycemic complications. Diabetic complications include nephropathy. Risk factors for coronary artery disease include diabetes mellitus, dyslipidemia, hypertension, post-menopausal and sedentary lifestyle. Current diabetic treatment includes insulin injections and oral agent (monotherapy). She participates in exercise intermittently. An ACE inhibitor/angiotensin II receptor blocker is being taken. Eye exam is current.     Past Medical History:  Diagnosis Date   Acute kidney injury superimposed on chronic kidney disease (HCC) 12/02/2021   Acute respiratory failure with hypoxia (HCC) 09/03/2021   Anemia due to chronic kidney disease 09/03/2021   BPPV (benign paroxysmal positional vertigo), left 01/24/2021   Chronic diastolic CHF (congestive heart failure) (HCC) 09/02/2021   Combined forms of age-related cataract of both eyes 08/24/2016   CVA, old, hemiparesis (HCC) 02/06/2013   Diabetes mellitus (HCC)    Diabetic macular edema (HCC) 08/22/2012   Diabetic retinopathy associated with type 2 diabetes mellitus (HCC) 09/20/2020   Essential hypertension    Gait  abnormality 12/26/2021   H/O: CVA (cerebrovascular accident) 04/10/2000   History of falling 12/26/2021   HTN (hypertension)    Hyperlipidemia    Hypermagnesemia 12/02/2021   Hypertensive heart and renal disease 09/20/2020   Hypertensive urgency 09/02/2021   Hypokalemia 12/02/2021   Left ventricular diastolic dysfunction, NYHA class 1 02/06/2013   NVG (neovascular glaucoma), left, severe stage 07/24/2019   Overweight (BMI 25.0-29.9) 10/09/2018   Poorly controlled type 2 diabetes mellitus with circulatory disorder (HCC)    Prolonged QT interval 12/02/2021   Pulmonary HTN (HCC)    mild by echo   Sensorineural hearing loss (SNHL), bilateral 01/24/2021   Stage 3b chronic kidney disease (HCC) 12/26/2021   Syncope and collapse 12/01/2021     Family History  Problem Relation Age of Onset   Hypertension Mother    Hypertension Father    Heart Problems Father    Hypertension Sister    Diabetes Sister    Hypertension Brother    Diabetes Brother    Breast cancer Neg Hx      Current Outpatient Medications:    ACCU-CHEK AVIVA PLUS test strip, TEST THREE TIMES DAILY AS DIRECTED, Disp: 100 strip, Rfl: 11   amLODipine (NORVASC) 5 MG tablet, Take 1 tablet (5 mg total) by mouth daily., Disp: 90 tablet, Rfl: 3   apixaban (ELIQUIS) 2.5 MG TABS tablet, Take 1 tablet (2.5 mg total) by mouth 2 (two) times daily., Disp: 60 tablet, Rfl: 2   atorvastatin (LIPITOR) 20 MG tablet, Take 1 tablet (20 mg total) by mouth daily., Disp: 90 tablet, Rfl: 2   azelastine (ASTELIN) 0.1 % nasal spray, Place 1 spray into both nostrils 2 (two) times daily. Use in each nostril as directed, Disp: 30 mL, Rfl: 12  B-D UF III MINI PEN NEEDLES 31G X 5 MM MISC, Use as directed 4x a day, Disp: 300 each, Rfl: 3   blood glucose meter kit and supplies, Dispense based on patient and insurance preference. Use up to four times daily as directed.dx code e11.65, Disp: 1 each, Rfl: 0   Blood Glucose Monitoring Suppl (TRUE METRIX  METER) DEVI, Use to check blood sugars 3 times a day., Disp: 1 each, Rfl: 3   cetirizine (ZYRTEC ALLERGY) 10 MG tablet, Take 1 tablet (10 mg total) by mouth daily., Disp: 90 tablet, Rfl: 2   Cholecalciferol (VITAMIN D3) 50 MCG (2000 UT) TABS, Take 1 tablet by mouth daily at 12 noon., Disp: , Rfl:    Ferrous Sulfate (IRON PO), Take 65 mg by mouth daily., Disp: , Rfl:    furosemide (LASIX) 20 MG tablet, Take 1 tablet (20 mg total) by mouth as needed (for swelling once daily)., Disp: 30 tablet, Rfl: 5   glucose blood (TRUE METRIX BLOOD GLUCOSE TEST) test strip, Use as instructed to check blood sugar 3 times a day. Dx code e11.65, Disp: 100 each, Rfl: 12   hydrALAZINE (APRESOLINE) 50 MG tablet, Take 1.5 tablets (75 mg total) by mouth 3 (three) times daily., Disp: 360 tablet, Rfl: 3   hydrOXYzine (ATARAX) 25 MG tablet, Take 1 tablet (25 mg total) by mouth 3 (three) times daily as needed for itching., Disp: 30 tablet, Rfl: 0   insulin lispro (HUMALOG KWIKPEN) 200 UNIT/ML KwikPen, inject 8-10 units into THE SKIN 15 minutes BEFORE THREE main meals of THE DAY, Disp: 9 mL, Rfl: 0   Lancets (ACCU-CHEK SOFT TOUCH) lancets, Check blood sugars 3 times daily E11.65, Disp: 100 each, Rfl: 12   latanoprost (XALATAN) 0.005 % ophthalmic solution, Place 1 drop into both eyes at bedtime. , Disp: , Rfl:    metoprolol succinate (TOPROL-XL) 25 MG 24 hr tablet, Take 0.5 tablets (12.5 mg total) by mouth daily., Disp: 45 tablet, Rfl: 3   Multiple Vitamin (MULTIVITAMIN) tablet, Take 1 tablet by mouth daily., Disp: , Rfl:    Semaglutide, 1 MG/DOSE, (OZEMPIC, 1 MG/DOSE,) 2 MG/1.5ML SOPN, Inject 1 mg into the skin once a week., Disp: 4.5 mL, Rfl: 1   TRESIBA FLEXTOUCH 200 UNIT/ML FlexTouch Pen, inject 20 units into THE SKIN daily, Disp: 9 mL, Rfl: 1   Allergies  Allergen Reactions   Brimonidine Other (See Comments)    Eyes swell and itch   Dorzolamide Other (See Comments)    Other reaction(s): Other (See Comments)  Red,  inflamed eyelid, dermatitis   Latex     Blisters/ skin irritation   Timolol Other (See Comments)    Caused eyes to swell and itch     Review of Systems  Constitutional: Negative.   Respiratory: Negative.    Cardiovascular: Negative.   Neurological: Negative.   Psychiatric/Behavioral: Negative.       There were no vitals filed for this visit. There is no height or weight on file to calculate BMI.   Objective:  Physical Exam      Assessment And Plan:     1. Hypertensive heart and renal disease with renal failure, stage 1 through stage 4 or unspecified chronic kidney disease, with heart failure  2. Pure hypercholesterolemia  3. Diabetic retinopathy associated with type 2 diabetes mellitus, macular edema presence unspecified, unspecified laterality, unspecified retinopathy severity  4. Chronic bilateral low back pain without sciatica  5. Gait abnormality  6. Stage 3b chronic kidney disease  7. Chronic diastolic CHF (congestive heart failure)     Patient was given opportunity to ask questions. Patient verbalized understanding of the plan and was able to repeat key elements of the plan. All questions were answered to their satisfaction.  Coolidge Breeze, CMA   I, Coolidge Breeze, CMA, have reviewed all documentation for this visit. The documentation on 07/31/22 for the exam, diagnosis, procedures, and orders are all accurate and complete.   IF YOU HAVE BEEN REFERRED TO A SPECIALIST, IT MAY TAKE 1-2 WEEKS TO SCHEDULE/PROCESS THE REFERRAL. IF YOU HAVE NOT HEARD FROM US/SPECIALIST IN TWO WEEKS, PLEASE GIVE [place] A CALL AT [phone].   THE PATIENT IS ENCOURAGED TO PRACTICE SOCIAL DISTANCING DUE TO THE COVID-19 PANDEMIC.

## 2022-08-01 ENCOUNTER — Ambulatory Visit: Payer: Medicare Other | Admitting: Internal Medicine

## 2022-08-01 DIAGNOSIS — I480 Paroxysmal atrial fibrillation: Secondary | ICD-10-CM | POA: Diagnosis not present

## 2022-08-01 DIAGNOSIS — Z7985 Long-term (current) use of injectable non-insulin antidiabetic drugs: Secondary | ICD-10-CM | POA: Diagnosis not present

## 2022-08-01 DIAGNOSIS — I272 Pulmonary hypertension, unspecified: Secondary | ICD-10-CM | POA: Diagnosis not present

## 2022-08-01 DIAGNOSIS — I5032 Chronic diastolic (congestive) heart failure: Secondary | ICD-10-CM | POA: Diagnosis not present

## 2022-08-01 DIAGNOSIS — I130 Hypertensive heart and chronic kidney disease with heart failure and stage 1 through stage 4 chronic kidney disease, or unspecified chronic kidney disease: Secondary | ICD-10-CM | POA: Diagnosis not present

## 2022-08-01 DIAGNOSIS — N184 Chronic kidney disease, stage 4 (severe): Secondary | ICD-10-CM | POA: Diagnosis not present

## 2022-08-01 DIAGNOSIS — E785 Hyperlipidemia, unspecified: Secondary | ICD-10-CM | POA: Diagnosis not present

## 2022-08-01 DIAGNOSIS — E1122 Type 2 diabetes mellitus with diabetic chronic kidney disease: Secondary | ICD-10-CM | POA: Diagnosis not present

## 2022-08-01 DIAGNOSIS — E1142 Type 2 diabetes mellitus with diabetic polyneuropathy: Secondary | ICD-10-CM | POA: Diagnosis not present

## 2022-08-01 DIAGNOSIS — Z7901 Long term (current) use of anticoagulants: Secondary | ICD-10-CM | POA: Diagnosis not present

## 2022-08-01 DIAGNOSIS — Z9181 History of falling: Secondary | ICD-10-CM | POA: Diagnosis not present

## 2022-08-01 DIAGNOSIS — Z794 Long term (current) use of insulin: Secondary | ICD-10-CM | POA: Diagnosis not present

## 2022-08-05 NOTE — Progress Notes (Signed)
Cancelled. This encounter was created in error - please disregard. 

## 2022-08-07 ENCOUNTER — Other Ambulatory Visit: Payer: Self-pay

## 2022-08-07 ENCOUNTER — Encounter (HOSPITAL_COMMUNITY)
Admission: RE | Admit: 2022-08-07 | Discharge: 2022-08-07 | Disposition: A | Discharge status: Home / Self Care | Payer: Medicare Other | Source: Ambulatory Visit | Attending: Nephrology | Admitting: Nephrology

## 2022-08-07 VITALS — BP 193/73 | HR 74 | Temp 98.1°F

## 2022-08-07 DIAGNOSIS — D631 Anemia in chronic kidney disease: Secondary | ICD-10-CM | POA: Diagnosis not present

## 2022-08-07 DIAGNOSIS — N189 Chronic kidney disease, unspecified: Secondary | ICD-10-CM | POA: Diagnosis not present

## 2022-08-07 LAB — CBC WITH DIFFERENTIAL/PLATELET
Abs Immature Granulocytes: 0.06 10*3/uL (ref 0.00–0.07)
Basophils Absolute: 0.1 10*3/uL (ref 0.0–0.1)
Basophils Relative: 0 %
Eosinophils Absolute: 0.2 10*3/uL (ref 0.0–0.5)
Eosinophils Relative: 2 %
HCT: 28.2 % — ABNORMAL LOW (ref 36.0–46.0)
Hemoglobin: 9.3 g/dL — ABNORMAL LOW (ref 12.0–15.0)
Immature Granulocytes: 1 %
Lymphocytes Relative: 11 %
Lymphs Abs: 1.2 10*3/uL (ref 0.7–4.0)
MCH: 29.9 pg (ref 26.0–34.0)
MCHC: 33 g/dL (ref 30.0–36.0)
MCV: 90.7 fL (ref 80.0–100.0)
Monocytes Absolute: 1 10*3/uL (ref 0.1–1.0)
Monocytes Relative: 8 %
Neutro Abs: 8.9 10*3/uL — ABNORMAL HIGH (ref 1.7–7.7)
Neutrophils Relative %: 78 %
Platelets: 391 10*3/uL (ref 150–400)
RBC: 3.11 MIL/uL — ABNORMAL LOW (ref 3.87–5.11)
RDW: 14.4 % (ref 11.5–15.5)
WBC: 11.4 10*3/uL — ABNORMAL HIGH (ref 4.0–10.5)
nRBC: 0 % (ref 0.0–0.2)

## 2022-08-07 LAB — RENAL FUNCTION PANEL
Albumin: 2.5 g/dL — ABNORMAL LOW (ref 3.5–5.0)
Anion gap: 12 (ref 5–15)
BUN: 61 mg/dL — ABNORMAL HIGH (ref 8–23)
CO2: 21 mmol/L — ABNORMAL LOW (ref 22–32)
Calcium: 8.6 mg/dL — ABNORMAL LOW (ref 8.9–10.3)
Chloride: 103 mmol/L (ref 98–111)
Creatinine, Ser: 5.29 mg/dL — ABNORMAL HIGH (ref 0.44–1.00)
GFR, Estimated: 8 mL/min — ABNORMAL LOW (ref >60)
Glucose, Bld: 214 mg/dL — ABNORMAL HIGH (ref 70–99)
Phosphorus: 5.3 mg/dL — ABNORMAL HIGH (ref 2.5–4.6)
Potassium: 3 mmol/L — ABNORMAL LOW (ref 3.5–5.1)
Sodium: 136 mmol/L (ref 135–145)

## 2022-08-07 LAB — POCT HEMOGLOBIN-HEMACUE: Hemoglobin: 13.1 g/dL (ref 12.0–15.0)

## 2022-08-07 MED ORDER — EPOETIN ALFA-EPBX 40000 UNIT/ML IJ SOLN: 40000.0000 [IU] | Freq: Once | INTRAMUSCULAR | Status: DC

## 2022-08-07 MED ORDER — CLONIDINE HCL 0.1 MG PO TABS
0.1000 mg | ORAL_TABLET | ORAL | Status: DC | PRN
  Administered 2022-08-07: 0.1 mg via ORAL
  Filled 2022-08-07: qty 1

## 2022-08-07 NOTE — Addendum Note (Signed)
Encounter addended by: Kandra Nicolas, RN on: 08/07/2022 11:37 AM  Actions taken: Order list changed, Therapy plan modified

## 2022-08-07 NOTE — Progress Notes (Signed)
Adria Devon MD office regarding BP systolic over 200 despite giving Clonidine as ordered - Hemocue 13.1 - no injection needed - patient advised to take home medications as ordered and recheck BP if able at home. Notify MD for any symptoms or changes.

## 2022-08-09 DIAGNOSIS — E1122 Type 2 diabetes mellitus with diabetic chronic kidney disease: Secondary | ICD-10-CM | POA: Diagnosis not present

## 2022-08-09 DIAGNOSIS — N184 Chronic kidney disease, stage 4 (severe): Secondary | ICD-10-CM | POA: Diagnosis not present

## 2022-08-09 DIAGNOSIS — I272 Pulmonary hypertension, unspecified: Secondary | ICD-10-CM | POA: Diagnosis not present

## 2022-08-09 DIAGNOSIS — Z7901 Long term (current) use of anticoagulants: Secondary | ICD-10-CM | POA: Diagnosis not present

## 2022-08-09 DIAGNOSIS — E785 Hyperlipidemia, unspecified: Secondary | ICD-10-CM | POA: Diagnosis not present

## 2022-08-09 DIAGNOSIS — Z7985 Long-term (current) use of injectable non-insulin antidiabetic drugs: Secondary | ICD-10-CM | POA: Diagnosis not present

## 2022-08-09 DIAGNOSIS — I480 Paroxysmal atrial fibrillation: Secondary | ICD-10-CM | POA: Diagnosis not present

## 2022-08-09 DIAGNOSIS — I130 Hypertensive heart and chronic kidney disease with heart failure and stage 1 through stage 4 chronic kidney disease, or unspecified chronic kidney disease: Secondary | ICD-10-CM | POA: Diagnosis not present

## 2022-08-09 DIAGNOSIS — E1142 Type 2 diabetes mellitus with diabetic polyneuropathy: Secondary | ICD-10-CM | POA: Diagnosis not present

## 2022-08-09 DIAGNOSIS — Z9181 History of falling: Secondary | ICD-10-CM | POA: Diagnosis not present

## 2022-08-09 DIAGNOSIS — Z794 Long term (current) use of insulin: Secondary | ICD-10-CM | POA: Diagnosis not present

## 2022-08-09 DIAGNOSIS — I5032 Chronic diastolic (congestive) heart failure: Secondary | ICD-10-CM | POA: Diagnosis not present

## 2022-08-10 ENCOUNTER — Telehealth: Payer: Self-pay

## 2022-08-10 NOTE — Progress Notes (Signed)
Care Management & Coordination Services Pharmacy Team  Reason for Encounter: Medication coordination and delivery  Contacted patient to discuss medications and coordinate delivery from [facility]. Spoke with family on 08/11/2022  Cycle dispensing form sent to Billee Cashing for review.   Last adherence delivery date: 07-24-2022  Patient is due for next adherence delivery on: 08-22-2022  This delivery to include: Vials  30 Days  Atorvastatin 20 mg daily  Cetirizine 10 mg daily  Metoprolol 25 mg 1/2 tablet daily   Amlodipine 5 mg daily  Hydroxyzine 25 mg 1-2 tabs at bedtime PRN  Insulin lispro use as directed with insulin pump Lasix 20 mg daily PRN Omnipods for insulin Eliquis 2.5 mg twice daily  Patient declined the following medications this month: Hydralazine- Plenty supply [place]- doesn't use Latanoprost- plenty supply  Pen needles- doesn't need Ozempic- plenty supply due to patient missing a few doses on patient's behalf. Patient was confused and thought she wasn't suppose to take ozempic. Patient's daughter stated she is helping her to remember to take it. Pepcid- Plenty supply  Refills requested from providers include: Eliquis- request sent by [facility] to specialist  Confirmed delivery date of 08-22-2022, advised patient that pharmacy will contact them the morning of delivery.   Any concerns about your medications? No  How often do you forget or accidentally miss a dose? Hydralazine has been taken 50 mg TID instead of 75 mg TID   Do you use a pillbox? Yes  Is patient in packaging No    Recent blood pressure readings are as follows: 04-29 202/77 Patient's daughter stated she just discovered patient has been taking 50 mg TID instead of 75 mg TID. 05-02 154/66.  Recent blood glucose readings are as follows: 05-03 97, 05-02 95, 05-01 100  Chart review: Recent office visits:  None  Recent consult visits:  08-07-2022 Pil[place]ton-Burchett, Marlow Baars, RN  (Oncology). Infusion given.  07-27-2022 Clayborne Artist, MD (Ophthalmology). Follow up visit with OCT scan. Follow up in 4 months.  07-24-2022 Pilkington-Burchett, Marlow Baars, RN (Oncology). Infusion given.  Hospital visits:  Medication Reconciliation was completed by comparing discharge summary, patient's EMR and Pharmacy list, and upon discussion with patient.   Admitted to the hospital on 06-15-2022 due to Hyperglycemia. Discharge date was 06-16-2022. Discharged from [facility]     New?Medications Started at [facility] Discharge:?? None   Medication Changes at Hospital Discharge: None   Medications Discontinued at Hospital Discharge: None   Medications that remain the same after Hospital Discharge:??  -All other medications will remain the same.     Hospital visits:  Medication Reconciliation was completed by comparing discharge summary, patient's EMR and Pharmacy list, and upon discussion with patient.   Admitted to the hospital on 06-02-2022 due to Chest pain. Discharge date was 06-02-2022. Discharged from [name].     New?Medications Started at [facility] Discharge:?? None   Medication Changes at Hospital Discharge: None   Medications Discontinued at Hospital Discharge: None   Medications that remain the same after Hospital Discharge:??  -All other medications will remain the same.     Hospital visits:  Medication Reconciliation was completed by comparing discharge summary, patient's EMR and Pharmacy list, and upon discussion with patient.   Admitted to the hospital on 05-16-2022 due to Swelling of right hand. Discharge date was 05-16-2022. Discharged from [facility] health urgent care at [facility]?Medications Started at [facility] Discharge:?? None   Medication Changes at Hospital Discharge:  None   Medications Discontinued at Hospital Discharge: None   Medications that remain the same after Hospital Discharge:??   -All other medications will remain the same.      Medications: Outpatient Encounter Medications as of 08/10/2022  Medication Sig Note   ACCU-CHEK AVIVA PLUS test strip TEST THREE TIMES DAILY AS DIRECTED    amLODipine (NORVASC) 5 MG tablet Take 1 tablet (5 mg total) by mouth daily.    apixaban (ELIQUIS) 2.5 MG TABS tablet Take 1 tablet (2.5 mg total) by mouth 2 (two) times daily.    atorvastatin (LIPITOR) 20 MG tablet Take 1 tablet (20 mg total) by mouth daily.    azelastine (ASTELIN) 0.1 % nasal spray Place 1 spray into both nostrils 2 (two) times daily. Use in each nostril as directed    B-D UF III MINI PEN NEEDLES 31G X 5 MM MISC Use as directed 4x a day    blood glucose meter kit and supplies Dispense based on patient and insurance preference. Use up to four times daily as directed.dx code e11.65    Blood Glucose Monitoring Suppl (TRUE METRIX METER) DEVI Use to check blood sugars 3 times a day.    cetirizine (ZYRTEC ALLERGY) 10 MG tablet Take 1 tablet (10 mg total) by mouth daily.    Cholecalciferol (VITAMIN D3) 50 MCG (2000 UT) TABS Take 1 tablet by mouth daily at 12 noon.    Ferrous Sulfate (IRON PO) Take 65 mg by mouth daily.    furosemide (LASIX) 20 MG tablet Take 1 tablet (20 mg total) by mouth as needed (for swelling once daily).    glucose blood (TRUE METRIX BLOOD GLUCOSE TEST) test strip Use as instructed to check blood sugar 3 times a day. Dx code e11.65    hydrALAZINE (APRESOLINE) 50 MG tablet Take 1.5 tablets (75 mg total) by mouth 3 (three) times daily.    hydrOXYzine (ATARAX) 25 MG tablet Take 1 tablet (25 mg total) by mouth 3 (three) times daily as needed for itching.    insulin lispro (HUMALOG KWIKPEN) 200 UNIT/ML KwikPen inject 8-10 units into THE SKIN 15 minutes BEFORE THREE main meals of THE DAY    Lancets (ACCU-CHEK SOFT TOUCH) lancets Check blood sugars 3 times daily E11.65    latanoprost (XALATAN) 0.005 % ophthalmic solution Place 1 drop into both eyes at bedtime.      metoprolol succinate (TOPROL-XL) 25 MG 24 hr tablet Take 0.5 tablets (12.5 mg total) by mouth daily.    Multiple Vitamin (MULTIVITAMIN) tablet Take 1 tablet by mouth daily.    Semaglutide, 1 MG/DOSE, (OZEMPIC, 1 MG/DOSE,) 2 MG/1.5ML SOPN Inject 1 mg into the skin once a week. 09/02/2021: Fridays   TRESIBA FLEXTOUCH 200 UNIT/ML FlexTouch Pen inject 20 units into THE SKIN daily    No facility-administered encounter medications on file as of 08/10/2022.   BP Readings from Last 3 Encounters:  08/07/22 (!) 193/73  07/24/22 (!) 190/76  07/10/22 (!) 175/69    Pulse Readings from Last 3 Encounters:  08/07/22 74  07/24/22 72  07/10/22 78    Lab Results  Component Value Date/Time   HGBA1C 8.4 (H) 03/22/2022 09:48 AM   HGBA1C 7.2 (H) 11/29/2021 02:23 PM   Lab Results  Component Value Date   CREATININE 5.29 (H) 08/07/2022   BUN 61 (H) 08/07/2022   GFRNONAA 8 (L) 08/07/2022   GFRAA 20 (L) 03/15/2020   NA 136 08/07/2022   K 3.0 (L) 08/07/2022   CALCIUM 8.6 (L) 08/07/2022  CO2 21 (L) 08/07/2022    Huey Romans [facility] Clinical Pharmacist Assistant [phone]

## 2022-08-15 DIAGNOSIS — Z7901 Long term (current) use of anticoagulants: Secondary | ICD-10-CM | POA: Diagnosis not present

## 2022-08-15 DIAGNOSIS — E785 Hyperlipidemia, unspecified: Secondary | ICD-10-CM | POA: Diagnosis not present

## 2022-08-15 DIAGNOSIS — I130 Hypertensive heart and chronic kidney disease with heart failure and stage 1 through stage 4 chronic kidney disease, or unspecified chronic kidney disease: Secondary | ICD-10-CM | POA: Diagnosis not present

## 2022-08-15 DIAGNOSIS — I5032 Chronic diastolic (congestive) heart failure: Secondary | ICD-10-CM | POA: Diagnosis not present

## 2022-08-15 DIAGNOSIS — E1122 Type 2 diabetes mellitus with diabetic chronic kidney disease: Secondary | ICD-10-CM | POA: Diagnosis not present

## 2022-08-15 DIAGNOSIS — Z9181 History of falling: Secondary | ICD-10-CM | POA: Diagnosis not present

## 2022-08-15 DIAGNOSIS — I480 Paroxysmal atrial fibrillation: Secondary | ICD-10-CM | POA: Diagnosis not present

## 2022-08-15 DIAGNOSIS — Z794 Long term (current) use of insulin: Secondary | ICD-10-CM | POA: Diagnosis not present

## 2022-08-15 DIAGNOSIS — N184 Chronic kidney disease, stage 4 (severe): Secondary | ICD-10-CM | POA: Diagnosis not present

## 2022-08-15 DIAGNOSIS — E1142 Type 2 diabetes mellitus with diabetic polyneuropathy: Secondary | ICD-10-CM | POA: Diagnosis not present

## 2022-08-15 DIAGNOSIS — Z7985 Long-term (current) use of injectable non-insulin antidiabetic drugs: Secondary | ICD-10-CM | POA: Diagnosis not present

## 2022-08-15 DIAGNOSIS — I272 Pulmonary hypertension, unspecified: Secondary | ICD-10-CM | POA: Diagnosis not present

## 2022-08-17 ENCOUNTER — Other Ambulatory Visit: Payer: Self-pay | Admitting: Nurse Practitioner

## 2022-08-17 ENCOUNTER — Encounter: Payer: Self-pay | Admitting: Internal Medicine

## 2022-08-17 ENCOUNTER — Ambulatory Visit (INDEPENDENT_AMBULATORY_CARE_PROVIDER_SITE_OTHER): Payer: Medicare Other | Admitting: Internal Medicine

## 2022-08-17 VITALS — BP 160/60 | HR 67 | Temp 97.8°F | Ht 62.0 in | Wt 146.0 lb

## 2022-08-17 DIAGNOSIS — I480 Paroxysmal atrial fibrillation: Secondary | ICD-10-CM

## 2022-08-17 DIAGNOSIS — E11319 Type 2 diabetes mellitus with unspecified diabetic retinopathy without macular edema: Secondary | ICD-10-CM

## 2022-08-17 DIAGNOSIS — R413 Other amnesia: Secondary | ICD-10-CM

## 2022-08-17 DIAGNOSIS — E1122 Type 2 diabetes mellitus with diabetic chronic kidney disease: Secondary | ICD-10-CM

## 2022-08-17 DIAGNOSIS — G8929 Other chronic pain: Secondary | ICD-10-CM

## 2022-08-17 DIAGNOSIS — Z794 Long term (current) use of insulin: Secondary | ICD-10-CM

## 2022-08-17 DIAGNOSIS — R350 Frequency of micturition: Secondary | ICD-10-CM

## 2022-08-17 DIAGNOSIS — I130 Hypertensive heart and chronic kidney disease with heart failure and stage 1 through stage 4 chronic kidney disease, or unspecified chronic kidney disease: Secondary | ICD-10-CM | POA: Diagnosis not present

## 2022-08-17 DIAGNOSIS — E162 Hypoglycemia, unspecified: Secondary | ICD-10-CM

## 2022-08-17 DIAGNOSIS — I509 Heart failure, unspecified: Secondary | ICD-10-CM | POA: Diagnosis not present

## 2022-08-17 DIAGNOSIS — Z7984 Long term (current) use of oral hypoglycemic drugs: Secondary | ICD-10-CM | POA: Diagnosis not present

## 2022-08-17 DIAGNOSIS — N1832 Chronic kidney disease, stage 3b: Secondary | ICD-10-CM

## 2022-08-17 DIAGNOSIS — R269 Unspecified abnormalities of gait and mobility: Secondary | ICD-10-CM

## 2022-08-17 DIAGNOSIS — E7800 Pure hypercholesterolemia, unspecified: Secondary | ICD-10-CM

## 2022-08-17 LAB — POCT URINALYSIS DIPSTICK
Bilirubin, UA: NEGATIVE
Glucose, UA: POSITIVE — AB
Ketones, UA: NEGATIVE
Leukocytes, UA: NEGATIVE
Nitrite, UA: NEGATIVE
Protein, UA: POSITIVE — AB
Spec Grav, UA: 1.02 (ref 1.010–1.025)
Urobilinogen, UA: 0.2 E.U./dL
pH, UA: 6 (ref 5.0–8.0)

## 2022-08-17 NOTE — Progress Notes (Signed)
Hershal Coria Martin,acting as a Neurosurgeon for Gwynneth Aliment, MD.,have documented all relevant documentation on the behalf of Gwynneth Aliment, MD,as directed by  Gwynneth Aliment, MD while in the presence of Gwynneth Aliment, MD.    Subjective:     Patient ID: [name] [name] , female    DOB: 04/29/1940 , 82 y.o.   MRN: [id number]   Chief Complaint  Patient presents with   Hypertension   Diabetes   Hyperlipidemia    HPI  Patient presents today for a diabetes and BP f/u.  She is accompanied by her daughter, [name] [name].    Patient reports having a lump on her lower back, she reports its no pain but its there. Patient also reports not being able to remember a lot of things. She is not sure what is contributing to these symptoms.   BP Readings from Last 3 Encounters: 08/17/22 : (!) 160/60 08/07/22 : (!) 193/73 07/24/22 : (!) 190/76     Hypertension This is a chronic problem. The current episode started more than 1 year ago. The problem has been gradually improving since onset. The problem is controlled. Risk factors for coronary artery disease include diabetes mellitus, dyslipidemia, post-menopausal state and sedentary lifestyle. The current treatment provides moderate improvement. Compliance problems include exercise.  Hypertensive end-organ damage includes kidney disease.  Diabetes She presents for her follow-up diabetic visit. She has type 2 diabetes mellitus. There are no hypoglycemic complications. Diabetic complications include nephropathy. Risk factors for coronary artery disease include diabetes mellitus, dyslipidemia, hypertension, post-menopausal and sedentary lifestyle. Current diabetic treatment includes insulin injections and oral agent (monotherapy). She participates in exercise intermittently. An ACE inhibitor/angiotensin II receptor blocker is being taken. Eye exam is current.  Hyperlipidemia     Past Medical History:  Diagnosis Date   Acute kidney injury superimposed on  chronic kidney disease (HCC) 12/02/2021   Acute respiratory failure with hypoxia (HCC) 09/03/2021   Anemia due to chronic kidney disease 09/03/2021   BPPV (benign paroxysmal positional vertigo), left 01/24/2021   Chronic diastolic CHF (congestive heart failure) (HCC) 09/02/2021   Combined forms of age-related cataract of both eyes 08/24/2016   CVA, old, hemiparesis (HCC) 02/06/2013   Diabetes mellitus (HCC)    Diabetic macular edema (HCC) 08/22/2012   Diabetic retinopathy associated with type 2 diabetes mellitus (HCC) 09/20/2020   Essential hypertension    Gait abnormality 12/26/2021   H/O: CVA (cerebrovascular accident) 04/10/2000   History of falling 12/26/2021   HTN (hypertension)    Hyperlipidemia    Hypermagnesemia 12/02/2021   Hypertensive heart and renal disease 09/20/2020   Hypertensive urgency 09/02/2021   Hypokalemia 12/02/2021   Left ventricular diastolic dysfunction, NYHA class 1 02/06/2013   NVG (neovascular glaucoma), left, severe stage 07/24/2019   Overweight (BMI 25.0-29.9) 10/09/2018   Poorly controlled type 2 diabetes mellitus with circulatory disorder (HCC)    Prolonged QT interval 12/02/2021   Pulmonary HTN (HCC)    mild by echo   Sensorineural hearing loss (SNHL), bilateral 01/24/2021   Stage 3b chronic kidney disease (HCC) 12/26/2021   Syncope and collapse 12/01/2021     Family History  Problem Relation Age of Onset   Hypertension Mother    Hypertension Father    Heart Problems Father    Hypertension Sister    Diabetes Sister    Hypertension Brother    Diabetes Brother    Breast cancer Neg Hx      Current Outpatient Medications:    ACCU-CHEK AVIVA  PLUS test strip, TEST THREE TIMES DAILY AS DIRECTED, Disp: 100 strip, Rfl: 11   amLODipine (NORVASC) 5 MG tablet, Take 1 tablet (5 mg total) by mouth daily., Disp: 90 tablet, Rfl: 3   atorvastatin (LIPITOR) 20 MG tablet, Take 1 tablet (20 mg total) by mouth daily., Disp: 90 tablet, Rfl: 2   azelastine  (ASTELIN) 0.1 % nasal spray, Place 1 spray into both nostrils 2 (two) times daily. Use in each nostril as directed, Disp: 30 mL, Rfl: 12   B-D UF III MINI PEN NEEDLES 31G X 5 MM MISC, Use as directed 4x a day, Disp: 300 each, Rfl: 3   blood glucose meter kit and supplies, Dispense based on patient and insurance preference. Use up to four times daily as directed.dx code e11.65, Disp: 1 each, Rfl: 0   Blood Glucose Monitoring Suppl (TRUE METRIX METER) DEVI, Use to check blood sugars 3 times a day., Disp: 1 each, Rfl: 3   cetirizine (ZYRTEC ALLERGY) 10 MG tablet, Take 1 tablet (10 mg total) by mouth daily., Disp: 90 tablet, Rfl: 2   Cholecalciferol (VITAMIN D3) 50 MCG (2000 UT) TABS, Take 1 tablet by mouth daily at 12 noon., Disp: , Rfl:    Ferrous Sulfate (IRON PO), Take 65 mg by mouth daily., Disp: , Rfl:    glucose blood (TRUE METRIX BLOOD GLUCOSE TEST) test strip, Use as instructed to check blood sugar 3 times a day. Dx code e11.65, Disp: 100 each, Rfl: 12   hydrALAZINE (APRESOLINE) 50 MG tablet, Take 1.5 tablets (75 mg total) by mouth 3 (three) times daily., Disp: 360 tablet, Rfl: 3   hydrOXYzine (ATARAX) 25 MG tablet, Take 1 tablet (25 mg total) by mouth 3 (three) times daily as needed for itching., Disp: 30 tablet, Rfl: 0   insulin lispro (HUMALOG KWIKPEN) 200 UNIT/ML KwikPen, inject 8-10 units into THE SKIN 15 minutes BEFORE THREE main meals of THE DAY, Disp: 9 mL, Rfl: 0   Lancets (ACCU-CHEK SOFT TOUCH) lancets, Check blood sugars 3 times daily E11.65, Disp: 100 each, Rfl: 12   latanoprost (XALATAN) 0.005 % ophthalmic solution, Place 1 drop into both eyes at bedtime. , Disp: , Rfl:    metoprolol succinate (TOPROL-XL) 25 MG 24 hr tablet, Take 0.5 tablets (12.5 mg total) by mouth daily., Disp: 45 tablet, Rfl: 3   Multiple Vitamin (MULTIVITAMIN) tablet, Take 1 tablet by mouth daily., Disp: , Rfl:    Semaglutide, 1 MG/DOSE, (OZEMPIC, 1 MG/DOSE,) 2 MG/1.5ML SOPN, Inject 1 mg into the skin once a  week., Disp: 4.5 mL, Rfl: 1   TRESIBA FLEXTOUCH 200 UNIT/ML FlexTouch Pen, inject 20 units into THE SKIN daily, Disp: 9 mL, Rfl: 1   apixaban (ELIQUIS) 2.5 MG TABS tablet, TAKE ONE TABLET BY MOUTH TWICE DAILY, Disp: 60 tablet, Rfl: 5   furosemide (LASIX) 20 MG tablet, Take 1 tablet (20 mg total) by mouth as needed (for swelling once daily)., Disp: 30 tablet, Rfl: 10   Allergies  Allergen Reactions   Brimonidine Other (See Comments)    Eyes swell and itch   Dorzolamide Other (See Comments)    Other reaction(s): Other (See Comments)  Red, inflamed eyelid, dermatitis   Latex     Blisters/ skin irritation   Timolol Other (See Comments)    Caused eyes to swell and itch     Review of Systems  Constitutional: Negative.   Respiratory: Negative.    Cardiovascular: Negative.   Gastrointestinal: Negative.   Skin:  Daughter states she has a knot on her back. Pt states it is not painful. First noticed this past weekend. Located above her buttocks. Denies recent fall.   Neurological:        Dgtr states pt is having memory issues.   Psychiatric/Behavioral: Negative.       Today's Vitals   08/17/22 1442 08/17/22 1539  BP: (!) 160/60 (!) 160/60  Pulse: 67   Temp: 97.8 F (36.6 C)   TempSrc: Oral   Weight: 146 lb (66.2 kg)   Height: 5\' 2"  (1.575 m)   PainSc: 0-No pain    Body mass index is 26.7 kg/m.  The ASCVD Risk score (Arnett DK, et al., 2019) failed to calculate for the following reasons:   The 2019 ASCVD risk score is only valid for ages 71 to 50   The patient has a prior MI or stroke diagnosis Wt Readings from Last 3 Encounters:  08/17/22 146 lb (66.2 kg)  06/23/22 149 lb 4.8 oz (67.7 kg)  06/15/22 147 lb 0.8 oz (66.7 kg)    Objective:  Physical Exam Vitals and nursing note reviewed.  Constitutional:      Appearance: Normal appearance.  HENT:     Head: Normocephalic and atraumatic.  Eyes:     Extraocular Movements: Extraocular movements intact.   Cardiovascular:     Rate and Rhythm: Normal rate and regular rhythm.     Heart sounds: Normal heart sounds.  Pulmonary:     Effort: Pulmonary effort is normal.     Breath sounds: Normal breath sounds.  Musculoskeletal:     Cervical back: Normal range of motion.     Comments: No tenderness to palpation of lumbar spine. No overlying erythema. No skin lesions noted. Sl. Overlying soft tissue swelling.   Skin:    General: Skin is warm.  Neurological:     General: No focal deficit present.     Mental Status: She is alert.  Psychiatric:        Mood and Affect: Mood normal.        Behavior: Behavior normal.         Assessment And Plan:     1. Hypertensive heart and renal disease with renal failure, stage 1 through stage 4 or unspecified chronic kidney disease, with heart failure (HCC) Comments: Chronic, uncontrolled. Importance of medication/dietary compliance was d/w patient. Encouraged to decrease her intake of processed meats, including hot dogs.  2. Diabetic retinopathy associated with type 2 diabetes mellitus, macular edema presence unspecified, unspecified laterality, unspecified retinopathy severity (HCC) Comments: Chronic, encouraged to keep f/u appts with Ophthalmology. Importance of medication/dietary compliance was discussed with the patient. - Hemoglobin A1c  3. Memory changes Comments: I will check labs as below, will supplement w/ vitamin B12 if needed. Will also check u/a today. Elevated BS can negatively affect memory. - Vitamin B12  4. Urinary frequency Comments: I will check u/a today. - POCT Urinalysis Dipstick (81002)  5. Stage 3b chronic kidney disease (HCC) Comments: Chronic, also followed by Renal. Reminded to avoid NSAIDs, stay hydrated and keep BP/BS controlled to decrease risk of worsening CKD.    Return in 4 months (on ), or move awv/dm check to Sept.   Patient was given opportunity to ask questions. Patient verbalized understanding of the  plan and was able to repeat key elements of the plan. All questions were answered to their satisfaction.   I, Gwynneth Aliment, MD, have reviewed all documentation for this visit. The documentation on  08/17/22 for the exam, diagnosis, procedures, and orders are all accurate and complete.   IF YOU HAVE BEEN REFERRED TO A SPECIALIST, IT MAY TAKE 1-2 WEEKS TO SCHEDULE/PROCESS THE REFERRAL. IF YOU HAVE NOT HEARD FROM US/SPECIALIST IN TWO WEEKS, PLEASE GIVE [place] A CALL AT [phone] X [phone].   THE PATIENT IS ENCOURAGED TO PRACTICE SOCIAL DISTANCING DUE TO THE COVID-19 PANDEMIC.

## 2022-08-17 NOTE — Patient Instructions (Signed)
Hypertension, Adult ?Hypertension is another name for high blood pressure. High blood pressure forces your heart to work harder to pump blood. This can cause problems over time. ?There are two numbers in a blood pressure reading. There is a top number (systolic) over a bottom number (diastolic). It is best to have a blood pressure that is below 120/80. ?What are the causes? ?The cause of this condition is not known. Some other conditions can lead to high blood pressure. ?What increases the risk? ?Some lifestyle factors can make you more likely to develop high blood pressure: ?Smoking. ?Not getting enough exercise or physical activity. ?Being overweight. ?Having too much fat, sugar, calories, or salt (sodium) in your diet. ?Drinking too much alcohol. ?Other risk factors include: ?Having any of these conditions: ?Heart disease. ?Diabetes. ?High cholesterol. ?Kidney disease. ?Obstructive sleep apnea. ?Having a family history of high blood pressure and high cholesterol. ?Age. The risk increases with age. ?Stress. ?What are the signs or symptoms? ?High blood pressure may not cause symptoms. Very high blood pressure (hypertensive crisis) may cause: ?Headache. ?Fast or uneven heartbeats (palpitations). ?Shortness of breath. ?Nosebleed. ?Vomiting or feeling like you may vomit (nauseous). ?Changes in how you see. ?Very bad chest pain. ?Feeling dizzy. ?Seizures. ?How is this treated? ?This condition is treated by making healthy lifestyle changes, such as: ?Eating healthy foods. ?Exercising more. ?Drinking less alcohol. ?Your doctor may prescribe medicine if lifestyle changes do not help enough and if: ?Your top number is above 130. ?Your bottom number is above 80. ?Your personal target blood pressure may vary. ?Follow these instructions at home: ?Eating and drinking ? ?If told, follow the DASH eating plan. To follow this plan: ?Fill one half of your plate at each meal with fruits and vegetables. ?Fill one fourth of your plate  at each meal with whole grains. Whole grains include whole-wheat pasta, brown rice, and whole-grain bread. ?Eat or drink low-fat dairy products, such as skim milk or low-fat yogurt. ?Fill one fourth of your plate at each meal with low-fat (lean) proteins. Low-fat proteins include fish, chicken without skin, eggs, beans, and tofu. ?Avoid fatty meat, cured and processed meat, or chicken with skin. ?Avoid pre-made or processed food. ?Limit the amount of salt in your diet to less than 1,500 mg each day. ?Do not drink alcohol if: ?Your doctor tells you not to drink. ?You are pregnant, may be pregnant, or are planning to become pregnant. ?If you drink alcohol: ?Limit how much you have to: ?0-1 drink a day for women. ?0-2 drinks a day for men. ?Know how much alcohol is in your drink. In the [place] one drink equals one 12 oz bottle of beer (355 mL), one 5 oz glass of wine (148 mL), or one 1? oz glass of hard liquor (44 mL). ?Lifestyle ? ?Work with your doctor to stay at a healthy weight or to lose weight. Ask your doctor what the best weight is for you. ?Get at least 30 minutes of exercise that causes your heart to beat faster (aerobic exercise) most days of the week. This may include walking, swimming, or biking. ?Get at least 30 minutes of exercise that strengthens your muscles (resistance exercise) at least 3 days a week. This may include lifting weights or doing Pilates. ?Do not smoke or use any products that contain nicotine or tobacco. If you need help quitting, ask your doctor. ?Check your blood pressure at home as told by your doctor. ?Keep all follow-up visits. ?Medicines ?Take over-the-counter and prescription medicines   only as told by your doctor. Follow directions carefully. ?Do not skip doses of blood pressure medicine. The medicine does not work as well if you skip doses. Skipping doses also puts you at risk for problems. ?Ask your doctor about side effects or reactions to medicines that you should watch  for. ?Contact a doctor if: ?You think you are having a reaction to the medicine you are taking. ?You have headaches that keep coming back. ?You feel dizzy. ?You have swelling in your ankles. ?You have trouble with your vision. ?Get help right away if: ?You get a very bad headache. ?You start to feel mixed up (confused). ?You feel weak or numb. ?You feel faint. ?You have very bad pain in your: ?Chest. ?Belly (abdomen). ?You vomit more than once. ?You have trouble breathing. ?These symptoms may be an emergency. Get help right away. Call 911. ?Do not wait to see if the symptoms will go away. ?Do not drive yourself to the hospital. ?Summary ?Hypertension is another name for high blood pressure. ?High blood pressure forces your heart to work harder to pump blood. ?For most people, a normal blood pressure is less than 120/80. ?Making healthy choices can help lower blood pressure. If your blood pressure does not get lower with healthy choices, you may need to take medicine. ?This information is not intended to replace advice given to you by your health care provider. Make sure you discuss any questions you have with your health care provider. ?Document Revised: 01/13/2021 Document Reviewed: 01/13/2021 ?Elsevier Patient Education ? 2023 [employer] ? ?

## 2022-08-18 LAB — HEMOGLOBIN A1C
Est. average glucose Bld gHb Est-mCnc: 148 mg/dL
Hgb A1c MFr Bld: 6.8 % — ABNORMAL HIGH (ref 4.8–5.6)

## 2022-08-18 LAB — VITAMIN B12: Vitamin B-12: >2000 pg/mL — ABNORMAL HIGH (ref 232–1245)

## 2022-08-18 NOTE — Telephone Encounter (Signed)
Eliquis 2.5mg  refill request received. Patient is 82 years old, weight-66.2kg, Crea-5.29 on 08/07/22, Diagnosis-Afib, and last seen by Gillian Shields on 06/23/22. Dose is appropriate based on dosing criteria. Will send in refill to requested pharmacy.

## 2022-08-21 DIAGNOSIS — I480 Paroxysmal atrial fibrillation: Secondary | ICD-10-CM | POA: Diagnosis not present

## 2022-08-21 DIAGNOSIS — Z7985 Long-term (current) use of injectable non-insulin antidiabetic drugs: Secondary | ICD-10-CM | POA: Diagnosis not present

## 2022-08-21 DIAGNOSIS — Z9181 History of falling: Secondary | ICD-10-CM | POA: Diagnosis not present

## 2022-08-21 DIAGNOSIS — E1122 Type 2 diabetes mellitus with diabetic chronic kidney disease: Secondary | ICD-10-CM | POA: Diagnosis not present

## 2022-08-21 DIAGNOSIS — I272 Pulmonary hypertension, unspecified: Secondary | ICD-10-CM | POA: Diagnosis not present

## 2022-08-21 DIAGNOSIS — N184 Chronic kidney disease, stage 4 (severe): Secondary | ICD-10-CM | POA: Diagnosis not present

## 2022-08-21 DIAGNOSIS — Z7901 Long term (current) use of anticoagulants: Secondary | ICD-10-CM | POA: Diagnosis not present

## 2022-08-21 DIAGNOSIS — E1142 Type 2 diabetes mellitus with diabetic polyneuropathy: Secondary | ICD-10-CM | POA: Diagnosis not present

## 2022-08-21 DIAGNOSIS — I5032 Chronic diastolic (congestive) heart failure: Secondary | ICD-10-CM | POA: Diagnosis not present

## 2022-08-21 DIAGNOSIS — I130 Hypertensive heart and chronic kidney disease with heart failure and stage 1 through stage 4 chronic kidney disease, or unspecified chronic kidney disease: Secondary | ICD-10-CM | POA: Diagnosis not present

## 2022-08-21 DIAGNOSIS — Z794 Long term (current) use of insulin: Secondary | ICD-10-CM | POA: Diagnosis not present

## 2022-08-21 DIAGNOSIS — E785 Hyperlipidemia, unspecified: Secondary | ICD-10-CM | POA: Diagnosis not present

## 2022-08-28 DIAGNOSIS — M6281 Muscle weakness (generalized): Secondary | ICD-10-CM | POA: Diagnosis not present

## 2022-08-28 DIAGNOSIS — R262 Difficulty in walking, not elsewhere classified: Secondary | ICD-10-CM | POA: Diagnosis not present

## 2022-08-28 DIAGNOSIS — R2689 Other abnormalities of gait and mobility: Secondary | ICD-10-CM | POA: Diagnosis not present

## 2022-08-29 ENCOUNTER — Other Ambulatory Visit: Payer: Self-pay

## 2022-08-29 MED ORDER — FUROSEMIDE 20 MG PO TABS: 20.0000 mg | ORAL_TABLET | ORAL | 10 refills | Status: DC | PRN

## 2022-08-31 DIAGNOSIS — Z713 Dietary counseling and surveillance: Secondary | ICD-10-CM | POA: Diagnosis not present

## 2022-08-31 DIAGNOSIS — E11649 Type 2 diabetes mellitus with hypoglycemia without coma: Secondary | ICD-10-CM | POA: Diagnosis not present

## 2022-08-31 DIAGNOSIS — Z9641 Presence of insulin pump (external) (internal): Secondary | ICD-10-CM | POA: Diagnosis not present

## 2022-09-01 DIAGNOSIS — I5032 Chronic diastolic (congestive) heart failure: Secondary | ICD-10-CM | POA: Diagnosis not present

## 2022-09-01 DIAGNOSIS — Z7985 Long-term (current) use of injectable non-insulin antidiabetic drugs: Secondary | ICD-10-CM | POA: Diagnosis not present

## 2022-09-01 DIAGNOSIS — E1122 Type 2 diabetes mellitus with diabetic chronic kidney disease: Secondary | ICD-10-CM | POA: Diagnosis not present

## 2022-09-01 DIAGNOSIS — Z9181 History of falling: Secondary | ICD-10-CM | POA: Diagnosis not present

## 2022-09-01 DIAGNOSIS — E1142 Type 2 diabetes mellitus with diabetic polyneuropathy: Secondary | ICD-10-CM | POA: Diagnosis not present

## 2022-09-01 DIAGNOSIS — N184 Chronic kidney disease, stage 4 (severe): Secondary | ICD-10-CM | POA: Diagnosis not present

## 2022-09-01 DIAGNOSIS — I480 Paroxysmal atrial fibrillation: Secondary | ICD-10-CM | POA: Diagnosis not present

## 2022-09-01 DIAGNOSIS — Z794 Long term (current) use of insulin: Secondary | ICD-10-CM | POA: Diagnosis not present

## 2022-09-01 DIAGNOSIS — I272 Pulmonary hypertension, unspecified: Secondary | ICD-10-CM | POA: Diagnosis not present

## 2022-09-01 DIAGNOSIS — E785 Hyperlipidemia, unspecified: Secondary | ICD-10-CM | POA: Diagnosis not present

## 2022-09-01 DIAGNOSIS — Z7901 Long term (current) use of anticoagulants: Secondary | ICD-10-CM | POA: Diagnosis not present

## 2022-09-01 DIAGNOSIS — I130 Hypertensive heart and chronic kidney disease with heart failure and stage 1 through stage 4 chronic kidney disease, or unspecified chronic kidney disease: Secondary | ICD-10-CM | POA: Diagnosis not present

## 2022-09-05 NOTE — Progress Notes (Deleted)
Cardiology Office Note:    Date:  09/05/2022   ID:  [name], [name] August 06, 1940, MRN [id number]  PCP:  Dorothyann Peng, MD  [facility] Providers Cardiologist:  Nicki Guadalajara, MD { Click to update primary MD,subspecialty MD or APP then REFRESH:1}  *** Referring MD: Dorothyann Peng, MD   Chief Complaint:  No chief complaint on file. {Click here for Visit Info    :1}    History of Present Illness:   [name] [name] is a 82 y.o. female with   a hx of HFpEF, HTN, HLD, DM2, CKD IV, diabetic polyneuropathy, CVA (2002), syncope, atrial fibrillation.  Remote CVA in 2002.  Cardiac catheterization 2004 with normal coronaries.  Myoview May 2018 negative for ischemia.  Echo 03/2021 LVEF 65 to 70%, grade 1 diastolic dysfunction, moderate LVH, severe MAC, no aortic stenosis.   Admitted May 2023 with acute on chronic diastolic heart failure.  Amlodipine previously decreased to 10 mg to 5 mg due to lower extremity edema and Lasix increased to 20 mg daily.  Hydralazine increased to 50 mg p.o. 3 times daily by PCP due to persistently elevated BP   Hospitalized August 2023 following syncopal episode.  CT head negative and EEG, carotid Dopplers unremarkable.  Repeat echo 11/2021 LVEF 60 to 65%, no RWMA, mild LVH, indeterminate diastolic parameters, RV normal systolic function, mild to moderate MS, moderate to severe MAC which is overall unchanged.  MRI brain no acute abnormality.  Suspected related to dehydration and orthostatic hypotension.   She was recommended for 30-day monitor office visit 12/22/2021 but it was not placed until 04/2022.  She saw Wallis Bamberg, NP 05/30/22 after monitor revealed atrial fibrillation.  She was started on Eliquis 2.5 mg twice daily and metoprolol continued.  She was unaware of atrial fibrillation and normal sinus rhythm at time of her clinic visit.      Patient last seen by Ms. Walker 06/23/22 and BP running high. Hydralazine increased.     Past Medical History:   Diagnosis Date   Acute kidney injury superimposed on chronic kidney disease (HCC) 12/02/2021   Acute respiratory failure with hypoxia (HCC) 09/03/2021   Anemia due to chronic kidney disease 09/03/2021   BPPV (benign paroxysmal positional vertigo), left 01/24/2021   Chronic diastolic CHF (congestive heart failure) (HCC) 09/02/2021   Combined forms of age-related cataract of both eyes 08/24/2016   CVA, old, hemiparesis (HCC) 02/06/2013   Diabetes mellitus (HCC)    Diabetic macular edema (HCC) 08/22/2012   Diabetic retinopathy associated with type 2 diabetes mellitus (HCC) 09/20/2020   Essential hypertension    Gait abnormality 12/26/2021   H/O: CVA (cerebrovascular accident) 04/10/2000   History of falling 12/26/2021   HTN (hypertension)    Hyperlipidemia    Hypermagnesemia 12/02/2021   Hypertensive heart and renal disease 09/20/2020   Hypertensive urgency 09/02/2021   Hypokalemia 12/02/2021   Left ventricular diastolic dysfunction, NYHA class 1 02/06/2013   NVG (neovascular glaucoma), left, severe stage 07/24/2019   Overweight (BMI 25.0-29.9) 10/09/2018   Poorly controlled type 2 diabetes mellitus with circulatory disorder (HCC)    Prolonged QT interval 12/02/2021   Pulmonary HTN (HCC)    mild by echo   Sensorineural hearing loss (SNHL), bilateral 01/24/2021   Stage 3b chronic kidney disease (HCC) 12/26/2021   Syncope and collapse 12/01/2021   Current Medications: No outpatient medications have been marked as taking for the 09/11/22 encounter (Appointment) with Dyann Kief, PA-C.    Allergies:   Brimonidine,  Dorzolamide, Latex, and Timolol   Social History   Tobacco Use   Smoking status: Never   Smokeless tobacco: Never  Vaping Use   Vaping Use: Never used  Substance Use Topics   Alcohol use: No   Drug use: Never    Family Hx: The patient's family history includes Diabetes in her brother and sister; Heart Problems in her father; Hypertension in her brother,  father, mother, and sister. There is no history of Breast cancer.  ROS     Physical Exam:    VS:  There were no vitals taken for this visit.    Wt Readings from Last 3 Encounters:  08/17/22 146 lb (66.2 kg)  06/23/22 149 lb 4.8 oz (67.7 kg)  06/15/22 147 lb 0.8 oz (66.7 kg)    Physical Exam  GEN: Well nourished, well developed, in no acute distress  HEENT: normal  Neck: no JVD, carotid bruits, or masses Cardiac:RRR; no murmurs, rubs, or gallops  Respiratory:  clear to auscultation bilaterally, normal work of breathing GI: soft, nontender, nondistended, + BS Ext: without cyanosis, clubbing, or edema, Good distal pulses bilaterally MS: no deformity or atrophy  Skin: warm and dry, no rash Neuro:  Alert and Oriented x 3, Strength and sensation are intact Psych: euthymic mood, full affect        EKGs/Labs/Other Test Reviewed:    EKG:  EKG is *** ordered today.  The ekg ordered today demonstrates ***  Recent Labs: 11/23/2021: B Natriuretic Peptide 330.4 05/30/2022: Magnesium 2.0; TSH 2.230 06/02/2022: ALT 11 08/07/2022: BUN 61; Creatinine, Ser 5.29; Hemoglobin 13.1; Platelets 391; Potassium 3.0; Sodium 136   Recent Lipid Panel Recent Labs    03/22/22 0948  CHOL 314*  TRIG 300*  HDL 50  LDLCALC 204*     Prior CV Studies: No results found for this or any previous visit from the past 3650 days.     STRESS TESTS   NM MYOCAR MULTI W/SPECT W 03/31/2013   ECHOCARDIOGRAM   ECHOCARDIOGRAM COMPLETE 12/02/2021   Narrative ECHOCARDIOGRAM REPORT       Patient Name:   [name] [name] Date of Exam: 12/02/2021 Medical Rec #:  [id number]         Height:       62.0 in Accession #:    [id number]        Weight:       162.0 lb Date of Birth:  10-14-1940         BSA:          1.748 m Patient Age:    81 years          BP:           169/58 mmHg Patient Gender: F                 HR:           71 bpm. [facility]   Procedure: 2D Echo, Cardiac Doppler and Color  Doppler   Indications:    Syncope   History:        Patient has prior history of Echocardiogram examinations, most recent 04/05/2021. CHF, Signs/Symptoms:Syncope; Risk Factors:Hypertension, Diabetes and Dyslipidemia.   Sonographer:    Mikki Harbor Referring Phys: [id number] OLADAPO ADEFESO     Sonographer Comments: Image acquisition challenging due to respiratory motion. IMPRESSIONS     1. Left ventricular ejection fraction, by estimation, is 60 to 65%. The left ventricle has normal function. The left ventricle has  no regional wall motion abnormalities. There is mild left ventricular hypertrophy. Left ventricular diastolic parameters are indeterminate. 2. Right ventricular systolic function is normal. The right ventricular size is normal. Tricuspid regurgitation signal is inadequate for assessing PA pressure. 3. Left atrial size was moderately dilated. 4. The mitral valve is abnormal. No evidence of mitral valve regurgitation. Mild to moderate mitral stenosis. The mean mitral valve gradient is 5.5 mmHg. Moderate to severe mitral annular calcification. 5. The aortic valve was not well visualized. Aortic valve regurgitation is not visualized. 6. The inferior vena cava is normal in size with greater than 50% respiratory variability, suggesting right atrial pressure of 3 mmHg.   Comparison(s): No significant change from prior study.   FINDINGS Left Ventricle: Left ventricular ejection fraction, by estimation, is 60 to 65%. The left ventricle has normal function. The left ventricle has no regional wall motion abnormalities. The left ventricular internal cavity size was normal in size. There is mild left ventricular hypertrophy. Left ventricular diastolic parameters are indeterminate.   Right Ventricle: The right ventricular size is normal. Right ventricular systolic function is normal. Tricuspid regurgitation signal is inadequate for assessing PA pressure.   Left Atrium: Left atrial size  was moderately dilated.   Right Atrium: Right atrial size was normal in size.   Pericardium: Trivial pericardial effusion is present.   Mitral Valve: The mitral valve is abnormal. Moderate to severe mitral annular calcification. No evidence of mitral valve regurgitation. Mild to moderate mitral valve stenosis. MV peak gradient, 12.7 mmHg. The mean mitral valve gradient is 5.5 mmHg.   Tricuspid Valve: The tricuspid valve is normal in structure. Tricuspid valve regurgitation is not demonstrated.   Aortic Valve: The aortic valve was not well visualized. Aortic valve regurgitation is not visualized. Aortic valve mean gradient measures 6.0 mmHg. Aortic valve peak gradient measures 12.1 mmHg. Aortic valve area, by VTI measures 2.64 cm.   Pulmonic Valve: Pulmonic valve regurgitation is trivial.   Aorta: The aortic root and ascending aorta are structurally normal, with no evidence of dilitation.   Venous: The inferior vena cava is normal in size with greater than 50% respiratory variability, suggesting right atrial pressure of 3 mmHg.     LEFT VENTRICLE PLAX 2D LVIDd:         5.20 cm     Diastology LVIDs:         2.50 cm     LV e' medial:    3.09 cm/s LV PW:         1.20 cm     LV E/e' medial:  50.5 LV IVS:        1.40 cm     LV e' lateral:   7.03 cm/s LVOT diam:     2.00 cm     LV E/e' lateral: 22.2 LV SV:         105 LV SV Index:   60 LVOT Area:     3.14 cm   LV Volumes (MOD) LV vol d, MOD A2C: 57.5 ml LV vol d, MOD A4C: 56.1 ml LV vol s, MOD A2C: 16.3 ml LV vol s, MOD A4C: 17.6 ml LV SV MOD A2C:     41.2 ml LV SV MOD A4C:     56.1 ml LV SV MOD BP:      40.1 ml   RIGHT VENTRICLE RV Basal diam:  3.25 cm RV Mid diam:    2.50 cm RV S prime:     14.30 cm/s TAPSE (M-mode): 2.3  cm   LEFT ATRIUM             Index        RIGHT ATRIUM           Index LA diam:        4.60 cm 2.63 cm/m   RA Area:     15.60 cm LA Vol (A2C):   79.9 ml 45.71 ml/m  RA Volume:   38.80 ml  22.20  ml/m LA Vol (A4C):   70.5 ml 40.33 ml/m LA Biplane Vol: 76.5 ml 43.77 ml/m AORTIC VALVE                     PULMONIC VALVE AV Area (Vmax):    2.37 cm      PV Vmax:       0.95 m/s AV Area (Vmean):   2.40 cm      PV Peak grad:  3.6 mmHg AV Area (VTI):     2.64 cm AV Vmax:           174.00 cm/s AV Vmean:          112.000 cm/s AV VTI:            0.398 m AV Peak Grad:      12.1 mmHg AV Mean Grad:      6.0 mmHg LVOT Vmax:         131.00 cm/s LVOT Vmean:        85.400 cm/s LVOT VTI:          0.334 m LVOT/AV VTI ratio: 0.84   AORTA Ao Root diam: 3.20 cm   MITRAL VALVE MV Area (PHT): 3.07 cm     SHUNTS MV Area VTI:   2.02 cm     Systemic VTI:  0.33 m MV Peak grad:  12.7 mmHg    Systemic Diam: 2.00 cm MV Mean grad:  5.5 mmHg MV Vmax:       1.78 m/s MV Vmean:      108.5 cm/s MV Decel Time: 247 msec MV E velocity: 156.00 cm/s MV A velocity: 151.00 cm/s MV E/A ratio:  1.03   [place] signed by Carolan Clines Signature Date/Time: 12/02/2021/1:40:42 PM       Final    MONITORS   CARDIAC EVENT MONITOR 06/05/2022   Risk Assessment/Calculations/Metrics:    CHA2DS2-VASc Score = 6  {Confirm score is correct.  If not, click here to update score.  REFRESH note.  :1} This indicates a 9.7% annual risk of stroke. The patient's score is based upon: CHF History: 1 HTN History: 1 Diabetes History: 1 Stroke History: 0 Vascular Disease History: 0 Age Score: 2 Gender Score: 1   {This patient has a significant risk of stroke if diagnosed with atrial fibrillation.  Please consider VKA or DOAC agent for anticoagulation if the bleeding risk is acceptable.   You can also use the SmartPhrase .[email or web address] for documentation.   :[id number]}     No BP recorded.  {Refresh Note OR Click here to enter BP  :1}***    ASSESSMENT & PLAN:   No problem-specific Assessment & Plan notes found for this encounter.   Paroxysmal atrial fibrillation/PVC/PAC-30-day monitor with 2% PAF  burden which was well-controlled.  Continue present dose metoprolol.  Continue Eliquis 2.5 mg twice daily.  Dose reduced due to age, renal function.  Reports no bleeding complications.  She is unaware of atrial fibrillation with no palpitations.   Hypertension-BP not at goal of  less than 130/80.  Increase hydralazine from 50 mg 3 times daily to 75 mg 3 times daily.  Continue amlodipine 5 mg daily (previously had swelling on increased doses), Lasix 20 mg daily.  Could consider further increases hydralazine in the future if needed for elevated blood pressure or addition of isosorbide.   HFpEF-euvolemic on exam.  Continue present dose metoprolol Lasix.  Low-sodium, heart healthy diet encouraged.   Hyperlipidemia-continue atorvastatin.        {Are you ordering a CV Procedure (e.g. stress test, cath, DCCV, TEE, etc)?   Press F2        :[id number]}   Dispo:  No follow-ups on file.   Medication Adjustments/Labs and Tests Ordered: Current medicines are reviewed at length with the patient today.  Concerns regarding medicines are outlined above.  Tests Ordered: No orders of the defined types were placed in this encounter.  Medication Changes: No orders of the defined types were placed in this encounter.  Elson Clan, PA-C  09/05/2022 8:50 AM    [facility] [address] [place], [place], [place]  [address] Phone: [phone]; Fax: [phone]

## 2022-09-08 ENCOUNTER — Encounter (HOSPITAL_COMMUNITY)
Admission: RE | Admit: 2022-09-08 | Discharge: 2022-09-08 | Disposition: A | Discharge status: Home / Self Care | Payer: Medicare Other | Source: Ambulatory Visit | Attending: Nephrology | Admitting: Nephrology

## 2022-09-08 VITALS — BP 182/66 | HR 72 | Temp 98.7°F | Resp 16

## 2022-09-08 DIAGNOSIS — N184 Chronic kidney disease, stage 4 (severe): Secondary | ICD-10-CM | POA: Insufficient documentation

## 2022-09-08 DIAGNOSIS — D631 Anemia in chronic kidney disease: Secondary | ICD-10-CM | POA: Diagnosis not present

## 2022-09-08 LAB — RENAL FUNCTION PANEL
Albumin: 2.7 g/dL — ABNORMAL LOW (ref 3.5–5.0)
Anion gap: 11 (ref 5–15)
BUN: 53 mg/dL — ABNORMAL HIGH (ref 8–23)
CO2: 21 mmol/L — ABNORMAL LOW (ref 22–32)
Calcium: 8.6 mg/dL — ABNORMAL LOW (ref 8.9–10.3)
Chloride: 105 mmol/L (ref 98–111)
Creatinine, Ser: 6.27 mg/dL — ABNORMAL HIGH (ref 0.44–1.00)
GFR, Estimated: 6 mL/min — ABNORMAL LOW (ref >60)
Glucose, Bld: 132 mg/dL — ABNORMAL HIGH (ref 70–99)
Phosphorus: 4.7 mg/dL — ABNORMAL HIGH (ref 2.5–4.6)
Potassium: 3.1 mmol/L — ABNORMAL LOW (ref 3.5–5.1)
Sodium: 137 mmol/L (ref 135–145)

## 2022-09-08 LAB — CBC WITH DIFFERENTIAL/PLATELET
Abs Immature Granulocytes: 0.03 10*3/uL (ref 0.00–0.07)
Basophils Absolute: 0 10*3/uL (ref 0.0–0.1)
Basophils Relative: 0 %
Eosinophils Absolute: 0.2 10*3/uL (ref 0.0–0.5)
Eosinophils Relative: 3 %
HCT: 29.5 % — ABNORMAL LOW (ref 36.0–46.0)
Hemoglobin: 9.8 g/dL — ABNORMAL LOW (ref 12.0–15.0)
Immature Granulocytes: 0 %
Lymphocytes Relative: 17 %
Lymphs Abs: 1.5 10*3/uL (ref 0.7–4.0)
MCH: 30.2 pg (ref 26.0–34.0)
MCHC: 33.2 g/dL (ref 30.0–36.0)
MCV: 90.8 fL (ref 80.0–100.0)
Monocytes Absolute: 0.9 10*3/uL (ref 0.1–1.0)
Monocytes Relative: 10 %
Neutro Abs: 6.1 10*3/uL (ref 1.7–7.7)
Neutrophils Relative %: 70 %
Platelets: 347 10*3/uL (ref 150–400)
RBC: 3.25 MIL/uL — ABNORMAL LOW (ref 3.87–5.11)
RDW: 14.6 % (ref 11.5–15.5)
WBC: 8.8 10*3/uL (ref 4.0–10.5)
nRBC: 0 % (ref 0.0–0.2)

## 2022-09-08 LAB — IRON AND TIBC
Iron: 52 ug/dL (ref 28–170)
Saturation Ratios: 26 % (ref 10.4–31.8)
TIBC: 203 ug/dL — ABNORMAL LOW (ref 250–450)
UIBC: 151 ug/dL

## 2022-09-08 LAB — FERRITIN: Ferritin: 404 ng/mL — ABNORMAL HIGH (ref 11–307)

## 2022-09-08 MED ORDER — EPOETIN ALFA-EPBX 40000 UNIT/ML IJ SOLN: 40000.0000 [IU] | Freq: Once | INTRAMUSCULAR | Status: DC

## 2022-09-08 MED ORDER — CLONIDINE HCL 0.1 MG PO TABS
0.1000 mg | ORAL_TABLET | Freq: Once | ORAL | Status: AC
  Administered 2022-09-08: 0.1 mg via ORAL
  Filled 2022-09-08: qty 1

## 2022-09-08 NOTE — Progress Notes (Signed)
BP elevated above 180 despite adinistration of Clonidine 0.1 mg - Amber, RMA at Dr. Hadley Pen office advised to hold injection for today and have patient return next week

## 2022-09-11 ENCOUNTER — Telehealth: Payer: Self-pay

## 2022-09-11 ENCOUNTER — Ambulatory Visit: Payer: Medicare Other | Admitting: Physician Assistant

## 2022-09-11 DIAGNOSIS — Z7901 Long term (current) use of anticoagulants: Secondary | ICD-10-CM | POA: Diagnosis not present

## 2022-09-11 DIAGNOSIS — Z794 Long term (current) use of insulin: Secondary | ICD-10-CM | POA: Diagnosis not present

## 2022-09-11 DIAGNOSIS — I272 Pulmonary hypertension, unspecified: Secondary | ICD-10-CM | POA: Diagnosis not present

## 2022-09-11 DIAGNOSIS — I5032 Chronic diastolic (congestive) heart failure: Secondary | ICD-10-CM | POA: Diagnosis not present

## 2022-09-11 DIAGNOSIS — I480 Paroxysmal atrial fibrillation: Secondary | ICD-10-CM | POA: Diagnosis not present

## 2022-09-11 DIAGNOSIS — E785 Hyperlipidemia, unspecified: Secondary | ICD-10-CM | POA: Diagnosis not present

## 2022-09-11 DIAGNOSIS — E1122 Type 2 diabetes mellitus with diabetic chronic kidney disease: Secondary | ICD-10-CM | POA: Diagnosis not present

## 2022-09-11 DIAGNOSIS — I130 Hypertensive heart and chronic kidney disease with heart failure and stage 1 through stage 4 chronic kidney disease, or unspecified chronic kidney disease: Secondary | ICD-10-CM | POA: Diagnosis not present

## 2022-09-11 DIAGNOSIS — Z9181 History of falling: Secondary | ICD-10-CM | POA: Diagnosis not present

## 2022-09-11 DIAGNOSIS — N184 Chronic kidney disease, stage 4 (severe): Secondary | ICD-10-CM | POA: Diagnosis not present

## 2022-09-11 DIAGNOSIS — Z7985 Long-term (current) use of injectable non-insulin antidiabetic drugs: Secondary | ICD-10-CM | POA: Diagnosis not present

## 2022-09-11 DIAGNOSIS — E1142 Type 2 diabetes mellitus with diabetic polyneuropathy: Secondary | ICD-10-CM | POA: Diagnosis not present

## 2022-09-11 NOTE — Progress Notes (Addendum)
Care Management & Coordination Services Pharmacy Team  Reason for Encounter: Medication coordination and delivery  Contacted patient to discuss medications and coordinate delivery from [facility]. Spoke with family on 09/11/2022  Cycle dispensing form sent to Billee Cashing for review.   Last adherence delivery date: 08-22-2022    Patient is due for next adherence delivery on: 09-21-2022  This delivery to include: Vials  30 Days  Atorvastatin 20 mg daily  Cetirizine 10 mg daily  Metoprolol 25 mg 1/2 tablet daily   Amlodipine 5 mg daily  Hydroxyzine 25 mg 1-2 tabs at bedtime PRN  Insulin lispro use as directed with insulin pump Lasix 20 mg daily PRN Omnipods for insulin Eliquis 2.5 mg twice daily Latanoprost Hydralazine 75 mg TID  Patient declined the following medications this month: Tresiba- doesn't use Pen needles- doesn't need Ozempic- plenty supply  Pepcid- Plenty supply    Refills requested from providers include: [facility]  Confirmed delivery date of 09-21-2022, advised patient that pharmacy will contact them the morning of delivery.   Any concerns about your medications? No  How often do you forget or accidentally miss a dose?  Patient's daughter stated patient misses at least 1 dose of hydralazine quite often. Family is trying to come up with a plan on reminding her to take all 3 doses. Sent message to PCP and Cherylin Mylar   Do you use a pillbox? Yes  Is patient in packaging No   Recent blood pressure readings are as follows: None available but daughter said BP is very elevated.  Recent blood glucose readings are as follows: Fasting readings ranging 97-100   Chart review: Recent office visits:  08-17-2022 Dorothyann Peng, MD. Follow up visit for HTN, DM and HDL.  Recent consult visits:  09-08-2022 Terrial Rhodes, MD (Oncology). Visit for SQ/IM injection  08-31-2022 Vernon Prey, RD (Diabetes). Follow up visit for  diabetes education.  Hospital visits:  Medication Reconciliation was completed by comparing discharge summary, patient's EMR and Pharmacy list, and upon discussion with patient.   Admitted to the hospital on 06-15-2022 due to Hyperglycemia. Discharge date was 06-16-2022. Discharged from [facility].     New?Medications Started at [facility] Discharge:?? None   Medication Changes at Hospital Discharge: None   Medications Discontinued at Hospital Discharge: None   Medications that remain the same after Hospital Discharge:??  -All other medications will remain the same.     Hospital visits:  Medication Reconciliation was completed by comparing discharge summary, patient's EMR and Pharmacy list, and upon discussion with patient.   Admitted to the hospital on 06-02-2022 due to Chest pain. Discharge date was 06-02-2022. Discharged from [facility].     New?Medications Started at [facility] Discharge:?? None   Medication Changes at Hospital Discharge: None   Medications Discontinued at Hospital Discharge: None   Medications that remain the same after Hospital Discharge:??  -All other medications will remain the same.     Hospital visits:  Medication Reconciliation was completed by comparing discharge summary, patient's EMR and Pharmacy list, and upon discussion with patient.   Admitted to the hospital on 05-16-2022 due to Swelling of right hand. Discharge date was 05-16-2022. Discharged from [facility] health urgent care at [facility]?Medications Started at [facility] Discharge:?? None   Medication Changes at Hospital Discharge: None   Medications Discontinued at Hospital Discharge: None   Medications that remain the same after Hospital Discharge:??  -All other medications will  remain the same.      Medications: Outpatient Encounter Medications as of 09/11/2022  Medication Sig Note   ACCU-CHEK AVIVA PLUS test strip TEST THREE TIMES DAILY AS  DIRECTED    amLODipine (NORVASC) 5 MG tablet Take 1 tablet (5 mg total) by mouth daily.    apixaban (ELIQUIS) 2.5 MG TABS tablet TAKE ONE TABLET BY MOUTH TWICE DAILY    atorvastatin (LIPITOR) 20 MG tablet Take 1 tablet (20 mg total) by mouth daily.    azelastine (ASTELIN) 0.1 % nasal spray Place 1 spray into both nostrils 2 (two) times daily. Use in each nostril as directed    B-D UF III MINI PEN NEEDLES 31G X 5 MM MISC Use as directed 4x a day    blood glucose meter kit and supplies Dispense based on patient and insurance preference. Use up to four times daily as directed.dx code e11.65    Blood Glucose Monitoring Suppl (TRUE METRIX METER) DEVI Use to check blood sugars 3 times a day.    cetirizine (ZYRTEC ALLERGY) 10 MG tablet Take 1 tablet (10 mg total) by mouth daily.    Cholecalciferol (VITAMIN D3) 50 MCG (2000 UT) TABS Take 1 tablet by mouth daily at 12 noon.    Ferrous Sulfate (IRON PO) Take 65 mg by mouth daily.    furosemide (LASIX) 20 MG tablet Take 1 tablet (20 mg total) by mouth as needed (for swelling once daily).    glucose blood (TRUE METRIX BLOOD GLUCOSE TEST) test strip Use as instructed to check blood sugar 3 times a day. Dx code e11.65    hydrALAZINE (APRESOLINE) 50 MG tablet Take 1.5 tablets (75 mg total) by mouth 3 (three) times daily.    hydrOXYzine (ATARAX) 25 MG tablet Take 1 tablet (25 mg total) by mouth 3 (three) times daily as needed for itching.    insulin lispro (HUMALOG KWIKPEN) 200 UNIT/ML KwikPen inject 8-10 units into THE SKIN 15 minutes BEFORE THREE main meals of THE DAY    Lancets (ACCU-CHEK SOFT TOUCH) lancets Check blood sugars 3 times daily E11.65    latanoprost (XALATAN) 0.005 % ophthalmic solution Place 1 drop into both eyes at bedtime.     metoprolol succinate (TOPROL-XL) 25 MG 24 hr tablet Take 0.5 tablets (12.5 mg total) by mouth daily.    Multiple Vitamin (MULTIVITAMIN) tablet Take 1 tablet by mouth daily.    Semaglutide, 1 MG/DOSE, (OZEMPIC, 1  MG/DOSE,) 2 MG/1.5ML SOPN Inject 1 mg into the skin once a week. 09/02/2021: Fridays   TRESIBA FLEXTOUCH 200 UNIT/ML FlexTouch Pen inject 20 units into THE SKIN daily    No facility-administered encounter medications on file as of 09/11/2022.   BP Readings from Last 3 Encounters:  09/08/22 (!) 182/66  08/17/22 (!) 160/60  08/07/22 (!) 193/73    Pulse Readings from Last 3 Encounters:  09/08/22 72  08/17/22 67  08/07/22 74    Lab Results  Component Value Date/Time   HGBA1C 6.8 (H) 08/17/2022 03:51 PM   HGBA1C 8.4 (H) 03/22/2022 09:48 AM   Lab Results  Component Value Date   CREATININE 6.27 (H) 09/08/2022   BUN 53 (H) 09/08/2022   GFRNONAA 6 (L) 09/08/2022   GFRAA 20 (L) 03/15/2020   NA 137 09/08/2022   K 3.1 (L) 09/08/2022   CALCIUM 8.6 (L) 09/08/2022   CO2 21 (L) 09/08/2022     Malecca Hicks [facility] Clinical Pharmacist Assistant [phone]

## 2022-09-12 LAB — POCT HEMOGLOBIN-HEMACUE: Hemoglobin: 9.7 g/dL — ABNORMAL LOW (ref 12.0–15.0)

## 2022-09-14 ENCOUNTER — Telehealth (HOSPITAL_COMMUNITY): Payer: Self-pay

## 2022-09-14 ENCOUNTER — Encounter (HOSPITAL_COMMUNITY)
Admission: RE | Admit: 2022-09-14 | Discharge: 2022-09-14 | Disposition: A | Discharge status: Home / Self Care | Payer: Medicare Other | Source: Ambulatory Visit | Attending: Nephrology | Admitting: Nephrology

## 2022-09-14 VITALS — BP 177/62 | Temp 97.9°F | Resp 17

## 2022-09-14 DIAGNOSIS — N184 Chronic kidney disease, stage 4 (severe): Secondary | ICD-10-CM | POA: Diagnosis not present

## 2022-09-14 DIAGNOSIS — D631 Anemia in chronic kidney disease: Secondary | ICD-10-CM | POA: Diagnosis not present

## 2022-09-14 LAB — POCT HEMOGLOBIN-HEMACUE: Hemoglobin: 9.7 g/dL — ABNORMAL LOW (ref 12.0–15.0)

## 2022-09-14 MED ORDER — EPOETIN ALFA-EPBX 40000 UNIT/ML IJ SOLN
40000.0000 [IU] | Freq: Once | INTRAMUSCULAR | Status: AC
  Administered 2022-09-14: 40000 [IU] via SUBCUTANEOUS

## 2022-09-14 MED ORDER — CLONIDINE HCL 0.1 MG PO TABS: 0.1000 mg | ORAL_TABLET | Freq: Once | ORAL | Status: DC

## 2022-09-14 NOTE — Progress Notes (Signed)
Diagnosis: Anemia in Chronic Kidney Disease  Provider:  Terrial Rhodes MD  Procedure: IV Infusion  Retacrit (epoetin alfa-epbx), Dose: 40000 Units, Site: subcutaneous (Right arm)  Number of injections: 1  Post Care: Patient declined observation  Discharge: Condition: Good, Destination: Home . AVS Provided  Performed by:  Jasper Riling, RN   HBG 9.7

## 2022-09-14 NOTE — Addendum Note (Signed)
Encounter addended by: Wyvonne Lenz, RN on: 09/14/2022 4:32 PM  Actions taken: Therapy plan modified

## 2022-09-14 NOTE — Addendum Note (Signed)
Encounter addended by: Wyvonne Lenz, RN on: 09/14/2022 3:50 PM  Actions taken: Flowsheet accepted, Charge Capture section accepted

## 2022-09-14 NOTE — Telephone Encounter (Signed)
Called and spoke with Joice Lofts, RMA at Dr. [facility], [phone]). Relayed that patient's blood pressure was initially 182/66, and then 177/62 upon repeat about 5 minutes later. Hgb 9.7 today. Per [facility], hold Clonidine and ok to proceed with Retacrit injection today. Confirmed via read-back.  Wyvonne Lenz, RN

## 2022-09-19 ENCOUNTER — Encounter: Payer: Self-pay | Admitting: Internal Medicine

## 2022-09-19 DIAGNOSIS — Z794 Long term (current) use of insulin: Secondary | ICD-10-CM | POA: Diagnosis not present

## 2022-09-19 DIAGNOSIS — Z7901 Long term (current) use of anticoagulants: Secondary | ICD-10-CM | POA: Diagnosis not present

## 2022-09-19 DIAGNOSIS — Z7985 Long-term (current) use of injectable non-insulin antidiabetic drugs: Secondary | ICD-10-CM | POA: Diagnosis not present

## 2022-09-19 DIAGNOSIS — I5032 Chronic diastolic (congestive) heart failure: Secondary | ICD-10-CM | POA: Diagnosis not present

## 2022-09-19 DIAGNOSIS — I480 Paroxysmal atrial fibrillation: Secondary | ICD-10-CM | POA: Diagnosis not present

## 2022-09-19 DIAGNOSIS — N184 Chronic kidney disease, stage 4 (severe): Secondary | ICD-10-CM | POA: Diagnosis not present

## 2022-09-19 DIAGNOSIS — E1122 Type 2 diabetes mellitus with diabetic chronic kidney disease: Secondary | ICD-10-CM | POA: Diagnosis not present

## 2022-09-19 DIAGNOSIS — E785 Hyperlipidemia, unspecified: Secondary | ICD-10-CM | POA: Diagnosis not present

## 2022-09-19 DIAGNOSIS — I130 Hypertensive heart and chronic kidney disease with heart failure and stage 1 through stage 4 chronic kidney disease, or unspecified chronic kidney disease: Secondary | ICD-10-CM | POA: Diagnosis not present

## 2022-09-19 DIAGNOSIS — E1142 Type 2 diabetes mellitus with diabetic polyneuropathy: Secondary | ICD-10-CM | POA: Diagnosis not present

## 2022-09-19 DIAGNOSIS — Z9181 History of falling: Secondary | ICD-10-CM | POA: Diagnosis not present

## 2022-09-19 DIAGNOSIS — I272 Pulmonary hypertension, unspecified: Secondary | ICD-10-CM | POA: Diagnosis not present

## 2022-09-25 DIAGNOSIS — Z794 Long term (current) use of insulin: Secondary | ICD-10-CM | POA: Diagnosis not present

## 2022-09-25 DIAGNOSIS — E113311 Type 2 diabetes mellitus with moderate nonproliferative diabetic retinopathy with macular edema, right eye: Secondary | ICD-10-CM | POA: Diagnosis not present

## 2022-09-26 DIAGNOSIS — E1142 Type 2 diabetes mellitus with diabetic polyneuropathy: Secondary | ICD-10-CM | POA: Diagnosis not present

## 2022-09-26 DIAGNOSIS — N184 Chronic kidney disease, stage 4 (severe): Secondary | ICD-10-CM | POA: Diagnosis not present

## 2022-09-26 DIAGNOSIS — I272 Pulmonary hypertension, unspecified: Secondary | ICD-10-CM | POA: Diagnosis not present

## 2022-09-26 DIAGNOSIS — E785 Hyperlipidemia, unspecified: Secondary | ICD-10-CM | POA: Diagnosis not present

## 2022-09-26 DIAGNOSIS — I130 Hypertensive heart and chronic kidney disease with heart failure and stage 1 through stage 4 chronic kidney disease, or unspecified chronic kidney disease: Secondary | ICD-10-CM | POA: Diagnosis not present

## 2022-09-26 DIAGNOSIS — Z7985 Long-term (current) use of injectable non-insulin antidiabetic drugs: Secondary | ICD-10-CM | POA: Diagnosis not present

## 2022-09-26 DIAGNOSIS — Z794 Long term (current) use of insulin: Secondary | ICD-10-CM | POA: Diagnosis not present

## 2022-09-26 DIAGNOSIS — I5032 Chronic diastolic (congestive) heart failure: Secondary | ICD-10-CM | POA: Diagnosis not present

## 2022-09-26 DIAGNOSIS — E1122 Type 2 diabetes mellitus with diabetic chronic kidney disease: Secondary | ICD-10-CM | POA: Diagnosis not present

## 2022-09-26 DIAGNOSIS — I480 Paroxysmal atrial fibrillation: Secondary | ICD-10-CM | POA: Diagnosis not present

## 2022-09-26 DIAGNOSIS — Z7901 Long term (current) use of anticoagulants: Secondary | ICD-10-CM | POA: Diagnosis not present

## 2022-09-26 DIAGNOSIS — Z9181 History of falling: Secondary | ICD-10-CM | POA: Diagnosis not present

## 2022-09-28 DIAGNOSIS — R262 Difficulty in walking, not elsewhere classified: Secondary | ICD-10-CM | POA: Diagnosis not present

## 2022-09-28 DIAGNOSIS — R2689 Other abnormalities of gait and mobility: Secondary | ICD-10-CM | POA: Diagnosis not present

## 2022-09-28 DIAGNOSIS — M6281 Muscle weakness (generalized): Secondary | ICD-10-CM | POA: Diagnosis not present

## 2022-10-02 DIAGNOSIS — Z7901 Long term (current) use of anticoagulants: Secondary | ICD-10-CM | POA: Diagnosis not present

## 2022-10-02 DIAGNOSIS — I5032 Chronic diastolic (congestive) heart failure: Secondary | ICD-10-CM | POA: Diagnosis not present

## 2022-10-02 DIAGNOSIS — Z7985 Long-term (current) use of injectable non-insulin antidiabetic drugs: Secondary | ICD-10-CM | POA: Diagnosis not present

## 2022-10-02 DIAGNOSIS — I272 Pulmonary hypertension, unspecified: Secondary | ICD-10-CM | POA: Diagnosis not present

## 2022-10-02 DIAGNOSIS — I480 Paroxysmal atrial fibrillation: Secondary | ICD-10-CM | POA: Diagnosis not present

## 2022-10-02 DIAGNOSIS — I130 Hypertensive heart and chronic kidney disease with heart failure and stage 1 through stage 4 chronic kidney disease, or unspecified chronic kidney disease: Secondary | ICD-10-CM | POA: Diagnosis not present

## 2022-10-02 DIAGNOSIS — N184 Chronic kidney disease, stage 4 (severe): Secondary | ICD-10-CM | POA: Diagnosis not present

## 2022-10-02 DIAGNOSIS — E1142 Type 2 diabetes mellitus with diabetic polyneuropathy: Secondary | ICD-10-CM | POA: Diagnosis not present

## 2022-10-02 DIAGNOSIS — E1122 Type 2 diabetes mellitus with diabetic chronic kidney disease: Secondary | ICD-10-CM | POA: Diagnosis not present

## 2022-10-02 DIAGNOSIS — Z9181 History of falling: Secondary | ICD-10-CM | POA: Diagnosis not present

## 2022-10-02 DIAGNOSIS — Z794 Long term (current) use of insulin: Secondary | ICD-10-CM | POA: Diagnosis not present

## 2022-10-02 DIAGNOSIS — E785 Hyperlipidemia, unspecified: Secondary | ICD-10-CM | POA: Diagnosis not present

## 2022-10-06 ENCOUNTER — Encounter (HOSPITAL_COMMUNITY): Payer: Medicare Other

## 2022-10-09 DIAGNOSIS — I120 Hypertensive chronic kidney disease with stage 5 chronic kidney disease or end stage renal disease: Secondary | ICD-10-CM | POA: Diagnosis not present

## 2022-10-09 DIAGNOSIS — E1122 Type 2 diabetes mellitus with diabetic chronic kidney disease: Secondary | ICD-10-CM | POA: Diagnosis not present

## 2022-10-09 DIAGNOSIS — D631 Anemia in chronic kidney disease: Secondary | ICD-10-CM | POA: Diagnosis not present

## 2022-10-09 DIAGNOSIS — N185 Chronic kidney disease, stage 5: Secondary | ICD-10-CM | POA: Diagnosis not present

## 2022-10-10 DIAGNOSIS — E1122 Type 2 diabetes mellitus with diabetic chronic kidney disease: Secondary | ICD-10-CM | POA: Diagnosis not present

## 2022-10-10 DIAGNOSIS — E1142 Type 2 diabetes mellitus with diabetic polyneuropathy: Secondary | ICD-10-CM | POA: Diagnosis not present

## 2022-10-10 DIAGNOSIS — Z9181 History of falling: Secondary | ICD-10-CM | POA: Diagnosis not present

## 2022-10-10 DIAGNOSIS — E785 Hyperlipidemia, unspecified: Secondary | ICD-10-CM | POA: Diagnosis not present

## 2022-10-10 DIAGNOSIS — I5032 Chronic diastolic (congestive) heart failure: Secondary | ICD-10-CM | POA: Diagnosis not present

## 2022-10-10 DIAGNOSIS — I130 Hypertensive heart and chronic kidney disease with heart failure and stage 1 through stage 4 chronic kidney disease, or unspecified chronic kidney disease: Secondary | ICD-10-CM | POA: Diagnosis not present

## 2022-10-10 DIAGNOSIS — N184 Chronic kidney disease, stage 4 (severe): Secondary | ICD-10-CM | POA: Diagnosis not present

## 2022-10-10 DIAGNOSIS — Z794 Long term (current) use of insulin: Secondary | ICD-10-CM | POA: Diagnosis not present

## 2022-10-10 DIAGNOSIS — I480 Paroxysmal atrial fibrillation: Secondary | ICD-10-CM | POA: Diagnosis not present

## 2022-10-10 DIAGNOSIS — Z7901 Long term (current) use of anticoagulants: Secondary | ICD-10-CM | POA: Diagnosis not present

## 2022-10-10 DIAGNOSIS — Z7985 Long-term (current) use of injectable non-insulin antidiabetic drugs: Secondary | ICD-10-CM | POA: Diagnosis not present

## 2022-10-10 DIAGNOSIS — I272 Pulmonary hypertension, unspecified: Secondary | ICD-10-CM | POA: Diagnosis not present

## 2022-10-16 ENCOUNTER — Encounter (HOSPITAL_COMMUNITY)
Admission: RE | Admit: 2022-10-16 | Discharge: 2022-10-16 | Disposition: A | Discharge status: Home / Self Care | Payer: Medicare Other | Source: Ambulatory Visit | Attending: Nephrology | Admitting: Nephrology

## 2022-10-16 VITALS — BP 177/68 | HR 71 | Temp 98.4°F | Resp 18

## 2022-10-16 DIAGNOSIS — N184 Chronic kidney disease, stage 4 (severe): Secondary | ICD-10-CM | POA: Insufficient documentation

## 2022-10-16 DIAGNOSIS — D631 Anemia in chronic kidney disease: Secondary | ICD-10-CM | POA: Diagnosis not present

## 2022-10-16 LAB — CBC WITH DIFFERENTIAL/PLATELET
Abs Immature Granulocytes: 0.03 10*3/uL (ref 0.00–0.07)
Basophils Absolute: 0.1 10*3/uL (ref 0.0–0.1)
Basophils Relative: 1 %
Eosinophils Absolute: 0.2 10*3/uL (ref 0.0–0.5)
Eosinophils Relative: 2 %
HCT: 29.9 % — ABNORMAL LOW (ref 36.0–46.0)
Hemoglobin: 9.9 g/dL — ABNORMAL LOW (ref 12.0–15.0)
Immature Granulocytes: 0 %
Lymphocytes Relative: 17 %
Lymphs Abs: 1.5 10*3/uL (ref 0.7–4.0)
MCH: 30.2 pg (ref 26.0–34.0)
MCHC: 33.1 g/dL (ref 30.0–36.0)
MCV: 91.2 fL (ref 80.0–100.0)
Monocytes Absolute: 0.8 10*3/uL (ref 0.1–1.0)
Monocytes Relative: 9 %
Neutro Abs: 6.5 10*3/uL (ref 1.7–7.7)
Neutrophils Relative %: 71 %
Platelets: 439 10*3/uL — ABNORMAL HIGH (ref 150–400)
RBC: 3.28 MIL/uL — ABNORMAL LOW (ref 3.87–5.11)
RDW: 14.2 % (ref 11.5–15.5)
WBC: 9.1 10*3/uL (ref 4.0–10.5)
nRBC: 0 % (ref 0.0–0.2)

## 2022-10-16 LAB — IRON AND TIBC
Iron: 63 ug/dL (ref 28–170)
Saturation Ratios: 29 % (ref 10.4–31.8)
TIBC: 221 ug/dL — ABNORMAL LOW (ref 250–450)
UIBC: 158 ug/dL

## 2022-10-16 LAB — POCT HEMOGLOBIN-HEMACUE: Hemoglobin: 9.9 g/dL — ABNORMAL LOW (ref 12.0–15.0)

## 2022-10-16 LAB — FERRITIN: Ferritin: 314 ng/mL — ABNORMAL HIGH (ref 11–307)

## 2022-10-16 MED ORDER — EPOETIN ALFA-EPBX 40000 UNIT/ML IJ SOLN
40000.0000 [IU] | Freq: Once | INTRAMUSCULAR | Status: AC
  Administered 2022-10-16: 40000 [IU] via SUBCUTANEOUS

## 2022-10-16 MED ORDER — CLONIDINE HCL 0.1 MG PO TABS: 0.1000 mg | ORAL_TABLET | Freq: Once | ORAL | Status: DC

## 2022-10-16 NOTE — Progress Notes (Signed)
Diagnosis: Anemia in Chronic Kidney Disease  Provider:  Terrial Rhodes MD  Procedure: Injection  Retacrit (epoetin alfa-epbx), Dose: 40000 Units, Site: subcutaneous, Number of injections: 1  Post Care: Patient declined observation  Discharge: Condition: Good, Destination: Home . AVS Provided  Performed by:  Claudia Desanctis, RN

## 2022-10-17 DIAGNOSIS — I5032 Chronic diastolic (congestive) heart failure: Secondary | ICD-10-CM | POA: Diagnosis not present

## 2022-10-17 DIAGNOSIS — E785 Hyperlipidemia, unspecified: Secondary | ICD-10-CM | POA: Diagnosis not present

## 2022-10-17 DIAGNOSIS — Z9181 History of falling: Secondary | ICD-10-CM | POA: Diagnosis not present

## 2022-10-17 DIAGNOSIS — E1122 Type 2 diabetes mellitus with diabetic chronic kidney disease: Secondary | ICD-10-CM | POA: Diagnosis not present

## 2022-10-17 DIAGNOSIS — Z7901 Long term (current) use of anticoagulants: Secondary | ICD-10-CM | POA: Diagnosis not present

## 2022-10-17 DIAGNOSIS — I480 Paroxysmal atrial fibrillation: Secondary | ICD-10-CM | POA: Diagnosis not present

## 2022-10-17 DIAGNOSIS — I130 Hypertensive heart and chronic kidney disease with heart failure and stage 1 through stage 4 chronic kidney disease, or unspecified chronic kidney disease: Secondary | ICD-10-CM | POA: Diagnosis not present

## 2022-10-17 DIAGNOSIS — N184 Chronic kidney disease, stage 4 (severe): Secondary | ICD-10-CM | POA: Diagnosis not present

## 2022-10-17 DIAGNOSIS — I272 Pulmonary hypertension, unspecified: Secondary | ICD-10-CM | POA: Diagnosis not present

## 2022-10-17 DIAGNOSIS — Z7985 Long-term (current) use of injectable non-insulin antidiabetic drugs: Secondary | ICD-10-CM | POA: Diagnosis not present

## 2022-10-17 DIAGNOSIS — E1142 Type 2 diabetes mellitus with diabetic polyneuropathy: Secondary | ICD-10-CM | POA: Diagnosis not present

## 2022-10-17 DIAGNOSIS — Z794 Long term (current) use of insulin: Secondary | ICD-10-CM | POA: Diagnosis not present

## 2022-10-19 ENCOUNTER — Ambulatory Visit: Payer: Medicare Other | Admitting: Internal Medicine

## 2022-10-21 DIAGNOSIS — E119 Type 2 diabetes mellitus without complications: Secondary | ICD-10-CM | POA: Diagnosis not present

## 2022-10-24 DIAGNOSIS — Z794 Long term (current) use of insulin: Secondary | ICD-10-CM | POA: Diagnosis not present

## 2022-10-24 DIAGNOSIS — Z7985 Long-term (current) use of injectable non-insulin antidiabetic drugs: Secondary | ICD-10-CM | POA: Diagnosis not present

## 2022-10-24 DIAGNOSIS — E1142 Type 2 diabetes mellitus with diabetic polyneuropathy: Secondary | ICD-10-CM | POA: Diagnosis not present

## 2022-10-24 DIAGNOSIS — Z9181 History of falling: Secondary | ICD-10-CM | POA: Diagnosis not present

## 2022-10-24 DIAGNOSIS — E785 Hyperlipidemia, unspecified: Secondary | ICD-10-CM | POA: Diagnosis not present

## 2022-10-24 DIAGNOSIS — I272 Pulmonary hypertension, unspecified: Secondary | ICD-10-CM | POA: Diagnosis not present

## 2022-10-24 DIAGNOSIS — I480 Paroxysmal atrial fibrillation: Secondary | ICD-10-CM | POA: Diagnosis not present

## 2022-10-24 DIAGNOSIS — I130 Hypertensive heart and chronic kidney disease with heart failure and stage 1 through stage 4 chronic kidney disease, or unspecified chronic kidney disease: Secondary | ICD-10-CM | POA: Diagnosis not present

## 2022-10-24 DIAGNOSIS — I5032 Chronic diastolic (congestive) heart failure: Secondary | ICD-10-CM | POA: Diagnosis not present

## 2022-10-24 DIAGNOSIS — E1122 Type 2 diabetes mellitus with diabetic chronic kidney disease: Secondary | ICD-10-CM | POA: Diagnosis not present

## 2022-10-24 DIAGNOSIS — N184 Chronic kidney disease, stage 4 (severe): Secondary | ICD-10-CM | POA: Diagnosis not present

## 2022-10-24 DIAGNOSIS — Z7901 Long term (current) use of anticoagulants: Secondary | ICD-10-CM | POA: Diagnosis not present

## 2022-10-27 ENCOUNTER — Ambulatory Visit (HOSPITAL_BASED_OUTPATIENT_CLINIC_OR_DEPARTMENT_OTHER): Payer: Medicare Other | Admitting: Family

## 2022-10-27 VITALS — BP 180/78 | HR 74 | Ht 62.0 in | Wt 143.8 lb

## 2022-10-27 DIAGNOSIS — E782 Mixed hyperlipidemia: Secondary | ICD-10-CM

## 2022-10-27 DIAGNOSIS — D6859 Other primary thrombophilia: Secondary | ICD-10-CM

## 2022-10-27 DIAGNOSIS — I5032 Chronic diastolic (congestive) heart failure: Secondary | ICD-10-CM | POA: Diagnosis not present

## 2022-10-27 DIAGNOSIS — I480 Paroxysmal atrial fibrillation: Secondary | ICD-10-CM | POA: Diagnosis not present

## 2022-10-27 DIAGNOSIS — I10 Essential (primary) hypertension: Secondary | ICD-10-CM | POA: Diagnosis not present

## 2022-10-27 MED ORDER — HYDRALAZINE HCL 100 MG PO TABS
100.0000 mg | ORAL_TABLET | Freq: Three times a day (TID) | ORAL | 3 refills | Status: DC
Start: 2022-10-27 — End: 2022-10-31

## 2022-10-27 NOTE — Patient Instructions (Signed)
Medication Instructions:  Your physician has recommended you make the following change in your medication:   CHANGE Hydralazine to 100mg  three times per day  *If you need a refill on your cardiac medications before your next appointment, please call your pharmacy*  Follow-Up: At [facility], you and your health needs are our priority.  As part of our continuing mission to provide you with exceptional heart care, we have created designated Provider Care Teams.  These Care Teams include your primary Cardiologist (physician) and Advanced Practice Providers (APPs -  Physician Assistants and Nurse Practitioners) who all work together to provide you with the care you need, when you need it.  We recommend signing up for the patient portal called "MyChart".  Sign up information is provided on this After Visit Summary.  MyChart is used to connect with patients for Virtual Visits (Telemedicine).  Patients are able to view lab/test results, encounter notes, upcoming appointments, etc.  Non-urgent messages can be sent to your provider as well.   To learn more about what you can do with MyChart, go to [email or web address].    Your next appointment:   2-3 month(s)  Provider:   Nicki Guadalajara, MD  or Alver Sorrow, NP    Other Instructions  Tips to Measure your Blood Pressure Correctly  Check blood pressure once per day at least an hour after your blood pressure medications  Here's what you can do to ensure a correct reading:  Don't drink a caffeinated beverage or smoke during the 30 minutes before the test.  Sit quietly for five minutes before the test begins.  During the measurement, sit in a chair with your feet on the floor and your arm supported so your elbow is at about heart level.  The inflatable part of the cuff should completely cover at least 80% of your upper arm, and the cuff should be placed on bare skin, not over a shirt.  Don't talk during the measurement.   Blood  pressure categories  Blood pressure category SYSTOLIC (upper number)  DIASTOLIC (lower number)  Normal Less than 120 mm Hg and Less than 80 mm Hg  Elevated 120-129 mm Hg and Less than 80 mm Hg  High blood pressure: Stage 1 hypertension 130-139 mm Hg or 80-89 mm Hg  High blood pressure: Stage 2 hypertension 140 mm Hg or higher or 90 mm Hg or higher  Hypertensive crisis (consult your doctor immediately) Higher than 180 mm Hg and/or Higher than 120 mm Hg  Source: American Heart Association and American Stroke Association. For more on getting your blood pressure under control, buy Controlling Your Blood Pressure, a Special Health Report from [facility].   Blood Pressure Log   Date   Time  Blood Pressure  Example: Nov 1 9 AM 124/78

## 2022-10-27 NOTE — Progress Notes (Unsigned)
Cardiology Office Note:  .   Date:  10/27/2022  ID:  Cambria, Osten November 29, 1940, MRN [id number] PCP: Dorothyann Peng, MD  [facility] Providers Cardiologist:  Nicki Guadalajara, MD { Click to update primary MD,subspecialty MD or APP then REFRESH:1}   History of Present Illness: .   ALIRA FRETWELL is a 82 y.o. female  with a hx of HFpEF, HTN, HLD, DM2, CKD IV, diabetic polyneuropathy, CVA (2002), syncope, atrial fibrillation last seen 06/23/22.    Remote CVA in 2002.  Cardiac catheterization 2004 with normal coronaries.  Myoview May 2018 negative for ischemia.  Echo 03/2021 LVEF 65 to 70%, grade 1 diastolic dysfunction, moderate LVH, severe MAC, no aortic stenosis.   Admitted May 2023 with acute on chronic diastolic heart failure.  Amlodipine previously decreased from 10 mg to 5 mg due to lower extremity edema and Lasix increased to 20 mg daily.  Hydralazine increased to 50 mg p.o. 3 times daily by PCP due to persistently elevated BP   Hospitalized August 2023 following syncopal episode.  CT head negative and EEG, carotid Dopplers unremarkable.  Repeat echo 11/2021 LVEF 60 to 65%, no RWMA, mild LVH, indeterminate diastolic parameters, RV normal systolic function, mild to moderate MS, moderate to severe MAC which is overall unchanged.  MRI brain no acute abnormality.  Suspected related to dehydration and orthostatic hypotension.   She was recommended for 30-day monitor office visit 12/22/2021 but it was not placed until 04/2022.  She saw Wallis Bamberg, NP 05/30/22 after monitor revealed atrial fibrillation (2% burden).  She was started on Eliquis 2.5 mg twice daily and metoprolol continued.  She was unaware of atrial fibrillation and normal sinus rhythm at time of her clinic visit.   Last seen 06/23/22 with BP not at goal and Hydralazine increased to 75mg  TID.   Presents today for follow up. ***   Lost 6-7 pounds since last seen.   Presents today for follow up with her daughter. She  did walk to the  She notes her energy level has been pretty good. She has been moving more in the home.   She continues to work with physical therapy at home most often once per week.   Her blood pressure at home has been ***. Systolic seems to be high often 170-180s when checked on wrist cuff at home. She saw Dr. Kathreen Cosier of endocrinology and her glucose was controlled 86% of the time on insulin pump and libre device which has improved her control. Daughter notes she has been better with her eatin ghabits.   Reports no shortness of breath nor dyspnea on exertion. Reports no chest pain, pressure, or tightness. No edema, orthopnea, PND. Reports no palpitations.    Has not yet taken her faternoon medications.    ROS: Please see the history of present illness.    All other systems reviewed and are negative.   Studies Reviewed: .        Cardiac Studies & Procedures     STRESS TESTS  NM MYOCAR MULTI W/SPECT W 09/06/2006   ECHOCARDIOGRAM  ECHOCARDIOGRAM COMPLETE 12/02/2021  Narrative ECHOCARDIOGRAM REPORT    Patient Name:   SHRESHTA MEDLEY Date of Exam: 12/02/2021 Medical Rec #:  [id number]         Height:       62.0 in Accession #:    [id number]        Weight:       162.0 lb Date of Birth:  1940/11/26  BSA:          1.748 m Patient Age:    81 years          BP:           169/58 mmHg Patient Gender: F                 HR:           71 bpm. [facility]  Procedure: 2D Echo, Cardiac Doppler and Color Doppler  Indications:    Syncope  History:        Patient has prior history of Echocardiogram examinations, most recent 04/05/2021. CHF, Signs/Symptoms:Syncope; Risk Factors:Hypertension, Diabetes and Dyslipidemia.  Sonographer:    Mikki Harbor Referring Phys: [id number] OLADAPO ADEFESO   Sonographer Comments: Image acquisition challenging due to respiratory motion. IMPRESSIONS   1. Left ventricular ejection fraction, by estimation, is 60 to 65%. The  left ventricle has normal function. The left ventricle has no regional wall motion abnormalities. There is mild left ventricular hypertrophy. Left ventricular diastolic parameters are indeterminate. 2. Right ventricular systolic function is normal. The right ventricular size is normal. Tricuspid regurgitation signal is inadequate for assessing PA pressure. 3. Left atrial size was moderately dilated. 4. The mitral valve is abnormal. No evidence of mitral valve regurgitation. Mild to moderate mitral stenosis. The mean mitral valve gradient is 5.5 mmHg. Moderate to severe mitral annular calcification. 5. The aortic valve was not well visualized. Aortic valve regurgitation is not visualized. 6. The inferior vena cava is normal in size with greater than 50% respiratory variability, suggesting right atrial pressure of 3 mmHg.  Comparison(s): No significant change from prior study.  FINDINGS Left Ventricle: Left ventricular ejection fraction, by estimation, is 60 to 65%. The left ventricle has normal function. The left ventricle has no regional wall motion abnormalities. The left ventricular internal cavity size was normal in size. There is mild left ventricular hypertrophy. Left ventricular diastolic parameters are indeterminate.  Right Ventricle: The right ventricular size is normal. Right ventricular systolic function is normal. Tricuspid regurgitation signal is inadequate for assessing PA pressure.  Left Atrium: Left atrial size was moderately dilated.  Right Atrium: Right atrial size was normal in size.  Pericardium: Trivial pericardial effusion is present.  Mitral Valve: The mitral valve is abnormal. Moderate to severe mitral annular calcification. No evidence of mitral valve regurgitation. Mild to moderate mitral valve stenosis. MV peak gradient, 12.7 mmHg. The mean mitral valve gradient is 5.5 mmHg.  Tricuspid Valve: The tricuspid valve is normal in structure. Tricuspid valve regurgitation  is not demonstrated.  Aortic Valve: The aortic valve was not well visualized. Aortic valve regurgitation is not visualized. Aortic valve mean gradient measures 6.0 mmHg. Aortic valve peak gradient measures 12.1 mmHg. Aortic valve area, by VTI measures 2.64 cm.  Pulmonic Valve: Pulmonic valve regurgitation is trivial.  Aorta: The aortic root and ascending aorta are structurally normal, with no evidence of dilitation.  Venous: The inferior vena cava is normal in size with greater than 50% respiratory variability, suggesting right atrial pressure of 3 mmHg.   LEFT VENTRICLE PLAX 2D LVIDd:         5.20 cm     Diastology LVIDs:         2.50 cm     LV e' medial:    3.09 cm/s LV PW:         1.20 cm     LV E/e' medial:  50.5 LV IVS:  1.40 cm     LV e' lateral:   7.03 cm/s LVOT diam:     2.00 cm     LV E/e' lateral: 22.2 LV SV:         105 LV SV Index:   60 LVOT Area:     3.14 cm  LV Volumes (MOD) LV vol d, MOD A2C: 57.5 ml LV vol d, MOD A4C: 56.1 ml LV vol s, MOD A2C: 16.3 ml LV vol s, MOD A4C: 17.6 ml LV SV MOD A2C:     41.2 ml LV SV MOD A4C:     56.1 ml LV SV MOD BP:      40.1 ml  RIGHT VENTRICLE RV Basal diam:  3.25 cm RV Mid diam:    2.50 cm RV S prime:     14.30 cm/s TAPSE (M-mode): 2.3 cm  LEFT ATRIUM             Index        RIGHT ATRIUM           Index LA diam:        4.60 cm 2.63 cm/m   RA Area:     15.60 cm LA Vol (A2C):   79.9 ml 45.71 ml/m  RA Volume:   38.80 ml  22.20 ml/m LA Vol (A4C):   70.5 ml 40.33 ml/m LA Biplane Vol: 76.5 ml 43.77 ml/m AORTIC VALVE                     PULMONIC VALVE AV Area (Vmax):    2.37 cm      PV Vmax:       0.95 m/s AV Area (Vmean):   2.40 cm      PV Peak grad:  3.6 mmHg AV Area (VTI):     2.64 cm AV Vmax:           174.00 cm/s AV Vmean:          112.000 cm/s AV VTI:            0.398 m AV Peak Grad:      12.1 mmHg AV Mean Grad:      6.0 mmHg LVOT Vmax:         131.00 cm/s LVOT Vmean:        85.400 cm/s LVOT VTI:           0.334 m LVOT/AV VTI ratio: 0.84  AORTA Ao Root diam: 3.20 cm  MITRAL VALVE MV Area (PHT): 3.07 cm     SHUNTS MV Area VTI:   2.02 cm     Systemic VTI:  0.33 m MV Peak grad:  12.7 mmHg    Systemic Diam: 2.00 cm MV Mean grad:  5.5 mmHg MV Vmax:       1.78 m/s MV Vmean:      108.5 cm/s MV Decel Time: 247 msec MV E velocity: 156.00 cm/s MV A velocity: 151.00 cm/s MV E/A ratio:  1.03  [place] signed by Carolan Clines Signature Date/Time: 12/02/2021/1:40:42 PM    Final    MONITORS  CARDIAC EVENT MONITOR 05/30/2022           Risk Assessment/Calculations:    CHA2DS2-VASc Score = 6  {Confirm score is correct.  If not, click here to update score.  REFRESH note.  :1} This indicates a 9.7% annual risk of stroke. The patient's score is based upon: CHF History: 1 HTN History: 1 Diabetes History: 1 Stroke History: 0  Vascular Disease History: 0 Age Score: 2 Gender Score: 1   {This patient has a significant risk of stroke if diagnosed with atrial fibrillation.  Please consider VKA or DOAC agent for anticoagulation if the bleeding risk is acceptable.   You can also use the SmartPhrase .[email or web address] for documentation.   :[id number]} No BP recorded.  {Refresh Note OR Click here to enter BP  :1}***       Physical Exam:   VS:  There were no vitals taken for this visit.   Wt Readings from Last 3 Encounters:  08/17/22 146 lb (66.2 kg)  06/23/22 149 lb 4.8 oz (67.7 kg)  06/15/22 147 lb 0.8 oz (66.7 kg)    GEN: Well nourished, well developed in no acute distress NECK: No JVD; No carotid bruits CARDIAC: ***RRR, no murmurs, rubs, gallops RESPIRATORY:  Clear to auscultation without rales, wheezing or rhonchi  ABDOMEN: Soft, non-tender, non-distended EXTREMITIES:  No edema; No deformity   ASSESSMENT AND PLAN: .    Paroxysmal atrial fibrillation/PVC/PAC-30-day monitor with 2% PAF burden which rate was well-controlled.  Continue present dose metoprolol.  Continue  Eliquis 2.5 mg twice daily.  Dose reduced due to age, renal function.  Reports no bleeding complications.  She is unaware of atrial fibrillation with no palpitations.   Hypertension-BP not at goal of less than 130/80. Continue amlodipine 5 mg daily (previously had swelling on increased doses), Lasix 20 mg daily.  *** Could consider further increases hydralazine in the future if needed for elevated blood pressure or addition of isosorbide.   HFpEF-euvolemic on exam.  Continue present dose metoprolol Lasix.  Low-sodium, heart healthy diet encouraged.   Hyperlipidemia-continue atorvastatin.  Anemia due to CKDIV -      {Are you ordering a CV Procedure (e.g. stress test, cath, DCCV, TEE, etc)?   Press F2        :[id number]}  Dispo: follow up in *** with Dr. Tresa Endo or APP  Signed, Alver Sorrow, NP

## 2022-10-28 DIAGNOSIS — M6281 Muscle weakness (generalized): Secondary | ICD-10-CM | POA: Diagnosis not present

## 2022-10-28 DIAGNOSIS — R2689 Other abnormalities of gait and mobility: Secondary | ICD-10-CM | POA: Diagnosis not present

## 2022-10-28 DIAGNOSIS — R262 Difficulty in walking, not elsewhere classified: Secondary | ICD-10-CM | POA: Diagnosis not present

## 2022-10-29 ENCOUNTER — Encounter (HOSPITAL_BASED_OUTPATIENT_CLINIC_OR_DEPARTMENT_OTHER): Payer: Self-pay

## 2022-10-29 DIAGNOSIS — I10 Essential (primary) hypertension: Secondary | ICD-10-CM

## 2022-10-30 DIAGNOSIS — I480 Paroxysmal atrial fibrillation: Secondary | ICD-10-CM | POA: Diagnosis not present

## 2022-10-30 DIAGNOSIS — N184 Chronic kidney disease, stage 4 (severe): Secondary | ICD-10-CM | POA: Diagnosis not present

## 2022-10-30 DIAGNOSIS — Z794 Long term (current) use of insulin: Secondary | ICD-10-CM | POA: Diagnosis not present

## 2022-10-30 DIAGNOSIS — Z9181 History of falling: Secondary | ICD-10-CM | POA: Diagnosis not present

## 2022-10-30 DIAGNOSIS — E785 Hyperlipidemia, unspecified: Secondary | ICD-10-CM | POA: Diagnosis not present

## 2022-10-30 DIAGNOSIS — I130 Hypertensive heart and chronic kidney disease with heart failure and stage 1 through stage 4 chronic kidney disease, or unspecified chronic kidney disease: Secondary | ICD-10-CM | POA: Diagnosis not present

## 2022-10-30 DIAGNOSIS — E1142 Type 2 diabetes mellitus with diabetic polyneuropathy: Secondary | ICD-10-CM | POA: Diagnosis not present

## 2022-10-30 DIAGNOSIS — E1122 Type 2 diabetes mellitus with diabetic chronic kidney disease: Secondary | ICD-10-CM | POA: Diagnosis not present

## 2022-10-30 DIAGNOSIS — Z7985 Long-term (current) use of injectable non-insulin antidiabetic drugs: Secondary | ICD-10-CM | POA: Diagnosis not present

## 2022-10-30 DIAGNOSIS — I5032 Chronic diastolic (congestive) heart failure: Secondary | ICD-10-CM | POA: Diagnosis not present

## 2022-10-30 DIAGNOSIS — Z7901 Long term (current) use of anticoagulants: Secondary | ICD-10-CM | POA: Diagnosis not present

## 2022-10-30 DIAGNOSIS — I272 Pulmonary hypertension, unspecified: Secondary | ICD-10-CM | POA: Diagnosis not present

## 2022-10-30 NOTE — Telephone Encounter (Signed)
Please advise 

## 2022-10-31 MED ORDER — ISOSORBIDE MONONITRATE ER 30 MG PO TB24: 30.0000 mg | ORAL_TABLET | Freq: Every day | ORAL | 11 refills | Status: DC

## 2022-10-31 MED ORDER — HYDRALAZINE HCL 25 MG PO TABS: 75.0000 mg | ORAL_TABLET | Freq: Three times a day (TID) | ORAL | 3 refills | Status: DC

## 2022-10-31 NOTE — Telephone Encounter (Signed)
Patient following up, just wanted to check I am good to make the changes still

## 2022-11-02 ENCOUNTER — Encounter (HOSPITAL_BASED_OUTPATIENT_CLINIC_OR_DEPARTMENT_OTHER): Payer: Self-pay | Admitting: Family

## 2022-11-06 DIAGNOSIS — E1142 Type 2 diabetes mellitus with diabetic polyneuropathy: Secondary | ICD-10-CM | POA: Diagnosis not present

## 2022-11-06 DIAGNOSIS — E785 Hyperlipidemia, unspecified: Secondary | ICD-10-CM | POA: Diagnosis not present

## 2022-11-06 DIAGNOSIS — N184 Chronic kidney disease, stage 4 (severe): Secondary | ICD-10-CM | POA: Diagnosis not present

## 2022-11-06 DIAGNOSIS — I480 Paroxysmal atrial fibrillation: Secondary | ICD-10-CM | POA: Diagnosis not present

## 2022-11-06 DIAGNOSIS — Z9181 History of falling: Secondary | ICD-10-CM | POA: Diagnosis not present

## 2022-11-06 DIAGNOSIS — I130 Hypertensive heart and chronic kidney disease with heart failure and stage 1 through stage 4 chronic kidney disease, or unspecified chronic kidney disease: Secondary | ICD-10-CM | POA: Diagnosis not present

## 2022-11-06 DIAGNOSIS — I5032 Chronic diastolic (congestive) heart failure: Secondary | ICD-10-CM | POA: Diagnosis not present

## 2022-11-06 DIAGNOSIS — Z7985 Long-term (current) use of injectable non-insulin antidiabetic drugs: Secondary | ICD-10-CM | POA: Diagnosis not present

## 2022-11-06 DIAGNOSIS — I272 Pulmonary hypertension, unspecified: Secondary | ICD-10-CM | POA: Diagnosis not present

## 2022-11-06 DIAGNOSIS — E1122 Type 2 diabetes mellitus with diabetic chronic kidney disease: Secondary | ICD-10-CM | POA: Diagnosis not present

## 2022-11-06 DIAGNOSIS — Z794 Long term (current) use of insulin: Secondary | ICD-10-CM | POA: Diagnosis not present

## 2022-11-06 DIAGNOSIS — Z7901 Long term (current) use of anticoagulants: Secondary | ICD-10-CM | POA: Diagnosis not present

## 2022-11-07 ENCOUNTER — Telehealth: Payer: Self-pay | Admitting: Pharmacy Technician

## 2022-11-07 NOTE — Telephone Encounter (Addendum)
Auth Submission: NO AUTH NEEDED Site of care: Site of care: [facility] Payer: [employer] Medication & CPT/J Code(s) submitted:  Retacrit [id number] Route of submission (phone, fax, portal): portal Phone # Fax # Auth type: Buy/Bill PB Units/visits requested: 40,000 units q2wks Reference number: [id number] Approval from: 11/07/22 to 11/07/23

## 2022-11-10 ENCOUNTER — Encounter (HOSPITAL_BASED_OUTPATIENT_CLINIC_OR_DEPARTMENT_OTHER): Payer: Self-pay

## 2022-11-13 ENCOUNTER — Ambulatory Visit: Payer: Medicare Other

## 2022-11-13 ENCOUNTER — Encounter: Payer: Medicare Other | Attending: Nephrology | Admitting: *Deleted

## 2022-11-13 ENCOUNTER — Inpatient Hospital Stay (HOSPITAL_COMMUNITY): Admission: RE | Admit: 2022-11-13 | Payer: Medicare Other | Source: Ambulatory Visit

## 2022-11-13 VITALS — BP 182/70 | HR 67 | Temp 98.2°F | Resp 18

## 2022-11-13 DIAGNOSIS — D631 Anemia in chronic kidney disease: Secondary | ICD-10-CM | POA: Insufficient documentation

## 2022-11-13 DIAGNOSIS — N184 Chronic kidney disease, stage 4 (severe): Secondary | ICD-10-CM | POA: Insufficient documentation

## 2022-11-13 MED ORDER — EPOETIN ALFA-EPBX 40000 UNIT/ML IJ SOLN
40000.0000 [IU] | Freq: Once | INTRAMUSCULAR | Status: AC
  Administered 2022-11-13: 40000 [IU] via SUBCUTANEOUS

## 2022-11-13 NOTE — Progress Notes (Signed)
Diagnosis: Anemia in Chronic Kidney Disease  Provider:  Terrial Rhodes MD  Procedure: Injection  Retacrit (epoetin alfa-epbx), Dose: 40000 Units, Site: subcutaneous, Number of injections: 1  Hgb. 10.5  Post Care: Observation period completed  Discharge: Condition: Good, Destination: Home . AVS Provided  Performed by:  Daleen Squibb, RN

## 2022-11-15 ENCOUNTER — Other Ambulatory Visit: Payer: Self-pay | Admitting: Cardiovascular Disease

## 2022-11-15 DIAGNOSIS — E1142 Type 2 diabetes mellitus with diabetic polyneuropathy: Secondary | ICD-10-CM | POA: Diagnosis not present

## 2022-11-15 DIAGNOSIS — Z9181 History of falling: Secondary | ICD-10-CM | POA: Diagnosis not present

## 2022-11-15 DIAGNOSIS — I5032 Chronic diastolic (congestive) heart failure: Secondary | ICD-10-CM | POA: Diagnosis not present

## 2022-11-15 DIAGNOSIS — E1122 Type 2 diabetes mellitus with diabetic chronic kidney disease: Secondary | ICD-10-CM | POA: Diagnosis not present

## 2022-11-15 DIAGNOSIS — I272 Pulmonary hypertension, unspecified: Secondary | ICD-10-CM | POA: Diagnosis not present

## 2022-11-15 DIAGNOSIS — N184 Chronic kidney disease, stage 4 (severe): Secondary | ICD-10-CM | POA: Diagnosis not present

## 2022-11-15 DIAGNOSIS — Z7901 Long term (current) use of anticoagulants: Secondary | ICD-10-CM | POA: Diagnosis not present

## 2022-11-15 DIAGNOSIS — E785 Hyperlipidemia, unspecified: Secondary | ICD-10-CM | POA: Diagnosis not present

## 2022-11-15 DIAGNOSIS — I130 Hypertensive heart and chronic kidney disease with heart failure and stage 1 through stage 4 chronic kidney disease, or unspecified chronic kidney disease: Secondary | ICD-10-CM | POA: Diagnosis not present

## 2022-11-15 DIAGNOSIS — Z7985 Long-term (current) use of injectable non-insulin antidiabetic drugs: Secondary | ICD-10-CM | POA: Diagnosis not present

## 2022-11-15 DIAGNOSIS — I480 Paroxysmal atrial fibrillation: Secondary | ICD-10-CM | POA: Diagnosis not present

## 2022-11-15 DIAGNOSIS — Z794 Long term (current) use of insulin: Secondary | ICD-10-CM | POA: Diagnosis not present

## 2022-11-23 DIAGNOSIS — I130 Hypertensive heart and chronic kidney disease with heart failure and stage 1 through stage 4 chronic kidney disease, or unspecified chronic kidney disease: Secondary | ICD-10-CM | POA: Diagnosis not present

## 2022-11-23 DIAGNOSIS — E113311 Type 2 diabetes mellitus with moderate nonproliferative diabetic retinopathy with macular edema, right eye: Secondary | ICD-10-CM | POA: Diagnosis not present

## 2022-11-23 DIAGNOSIS — H401133 Primary open-angle glaucoma, bilateral, severe stage: Secondary | ICD-10-CM | POA: Diagnosis not present

## 2022-11-23 DIAGNOSIS — I480 Paroxysmal atrial fibrillation: Secondary | ICD-10-CM | POA: Diagnosis not present

## 2022-11-23 DIAGNOSIS — E1142 Type 2 diabetes mellitus with diabetic polyneuropathy: Secondary | ICD-10-CM | POA: Diagnosis not present

## 2022-11-23 DIAGNOSIS — N184 Chronic kidney disease, stage 4 (severe): Secondary | ICD-10-CM | POA: Diagnosis not present

## 2022-11-23 DIAGNOSIS — I5032 Chronic diastolic (congestive) heart failure: Secondary | ICD-10-CM | POA: Diagnosis not present

## 2022-11-23 DIAGNOSIS — E1122 Type 2 diabetes mellitus with diabetic chronic kidney disease: Secondary | ICD-10-CM | POA: Diagnosis not present

## 2022-11-23 DIAGNOSIS — E785 Hyperlipidemia, unspecified: Secondary | ICD-10-CM | POA: Diagnosis not present

## 2022-11-23 DIAGNOSIS — Z7985 Long-term (current) use of injectable non-insulin antidiabetic drugs: Secondary | ICD-10-CM | POA: Diagnosis not present

## 2022-11-23 DIAGNOSIS — Z794 Long term (current) use of insulin: Secondary | ICD-10-CM | POA: Diagnosis not present

## 2022-11-23 DIAGNOSIS — Z7901 Long term (current) use of anticoagulants: Secondary | ICD-10-CM | POA: Diagnosis not present

## 2022-11-23 DIAGNOSIS — I272 Pulmonary hypertension, unspecified: Secondary | ICD-10-CM | POA: Diagnosis not present

## 2022-11-23 DIAGNOSIS — Z9181 History of falling: Secondary | ICD-10-CM | POA: Diagnosis not present

## 2022-11-23 DIAGNOSIS — H34812 Central retinal vein occlusion, left eye, with macular edema: Secondary | ICD-10-CM | POA: Diagnosis not present

## 2022-11-23 DIAGNOSIS — H4052X3 Glaucoma secondary to other eye disorders, left eye, severe stage: Secondary | ICD-10-CM | POA: Diagnosis not present

## 2022-11-26 DIAGNOSIS — S80812A Abrasion, left lower leg, initial encounter: Secondary | ICD-10-CM | POA: Diagnosis not present

## 2022-11-28 DIAGNOSIS — M6281 Muscle weakness (generalized): Secondary | ICD-10-CM | POA: Diagnosis not present

## 2022-11-28 DIAGNOSIS — R262 Difficulty in walking, not elsewhere classified: Secondary | ICD-10-CM | POA: Diagnosis not present

## 2022-11-28 DIAGNOSIS — R2689 Other abnormalities of gait and mobility: Secondary | ICD-10-CM | POA: Diagnosis not present

## 2022-11-30 ENCOUNTER — Telehealth: Payer: Self-pay | Admitting: Family

## 2022-11-30 ENCOUNTER — Ambulatory Visit: Payer: Medicare Other | Admitting: Family Medicine

## 2022-11-30 ENCOUNTER — Encounter: Payer: Self-pay | Admitting: Family Medicine

## 2022-11-30 VITALS — BP 160/60 | HR 77 | Temp 98.1°F

## 2022-11-30 DIAGNOSIS — Z9181 History of falling: Secondary | ICD-10-CM | POA: Diagnosis not present

## 2022-11-30 DIAGNOSIS — Z5189 Encounter for other specified aftercare: Secondary | ICD-10-CM

## 2022-11-30 DIAGNOSIS — Z794 Long term (current) use of insulin: Secondary | ICD-10-CM | POA: Diagnosis not present

## 2022-11-30 DIAGNOSIS — D631 Anemia in chronic kidney disease: Secondary | ICD-10-CM | POA: Diagnosis not present

## 2022-11-30 DIAGNOSIS — S81802D Unspecified open wound, left lower leg, subsequent encounter: Secondary | ICD-10-CM | POA: Diagnosis not present

## 2022-11-30 DIAGNOSIS — R21 Rash and other nonspecific skin eruption: Secondary | ICD-10-CM

## 2022-11-30 DIAGNOSIS — N184 Chronic kidney disease, stage 4 (severe): Secondary | ICD-10-CM | POA: Diagnosis not present

## 2022-11-30 DIAGNOSIS — I130 Hypertensive heart and chronic kidney disease with heart failure and stage 1 through stage 4 chronic kidney disease, or unspecified chronic kidney disease: Secondary | ICD-10-CM | POA: Diagnosis not present

## 2022-11-30 DIAGNOSIS — I480 Paroxysmal atrial fibrillation: Secondary | ICD-10-CM | POA: Diagnosis not present

## 2022-11-30 DIAGNOSIS — I272 Pulmonary hypertension, unspecified: Secondary | ICD-10-CM | POA: Diagnosis not present

## 2022-11-30 DIAGNOSIS — Z7901 Long term (current) use of anticoagulants: Secondary | ICD-10-CM | POA: Diagnosis not present

## 2022-11-30 DIAGNOSIS — E1151 Type 2 diabetes mellitus with diabetic peripheral angiopathy without gangrene: Secondary | ICD-10-CM | POA: Diagnosis not present

## 2022-11-30 DIAGNOSIS — I5032 Chronic diastolic (congestive) heart failure: Secondary | ICD-10-CM | POA: Diagnosis not present

## 2022-11-30 DIAGNOSIS — E785 Hyperlipidemia, unspecified: Secondary | ICD-10-CM | POA: Diagnosis not present

## 2022-11-30 DIAGNOSIS — E1122 Type 2 diabetes mellitus with diabetic chronic kidney disease: Secondary | ICD-10-CM | POA: Diagnosis not present

## 2022-11-30 DIAGNOSIS — M10341 Gout due to renal impairment, right hand: Secondary | ICD-10-CM | POA: Diagnosis not present

## 2022-11-30 DIAGNOSIS — S81802A Unspecified open wound, left lower leg, initial encounter: Secondary | ICD-10-CM | POA: Insufficient documentation

## 2022-11-30 DIAGNOSIS — I2510 Atherosclerotic heart disease of native coronary artery without angina pectoris: Secondary | ICD-10-CM | POA: Diagnosis not present

## 2022-11-30 DIAGNOSIS — E11311 Type 2 diabetes mellitus with unspecified diabetic retinopathy with macular edema: Secondary | ICD-10-CM | POA: Diagnosis not present

## 2022-11-30 DIAGNOSIS — Z7985 Long-term (current) use of injectable non-insulin antidiabetic drugs: Secondary | ICD-10-CM | POA: Diagnosis not present

## 2022-11-30 DIAGNOSIS — E1142 Type 2 diabetes mellitus with diabetic polyneuropathy: Secondary | ICD-10-CM | POA: Diagnosis not present

## 2022-11-30 MED ORDER — CEFTRIAXONE SODIUM 1 G IJ SOLR
1.0000 g | Freq: Once | INTRAMUSCULAR | 0 refills | Status: DC
Start: 2022-11-30 — End: 2022-11-30

## 2022-11-30 MED ORDER — TRAMADOL HCL 50 MG PO TABS
50.0000 mg | ORAL_TABLET | Freq: Three times a day (TID) | ORAL | 0 refills | Status: AC | PRN
Start: 2022-11-30 — End: 2022-12-05

## 2022-11-30 MED ORDER — CEFTRIAXONE SODIUM 1 G IJ SOLR
1.0000 g | Freq: Once | INTRAMUSCULAR | Status: AC
Start: 2022-11-30 — End: 2022-11-30
  Administered 2022-11-30: 1 g via INTRAMUSCULAR

## 2022-11-30 MED ORDER — DOXYCYCLINE HYCLATE 100 MG PO TABS: 100.0000 mg | ORAL_TABLET | Freq: Two times a day (BID) | ORAL | 0 refills | Status: AC

## 2022-11-30 NOTE — Telephone Encounter (Signed)
Advised daughter, verbalized understanding  

## 2022-11-30 NOTE — Telephone Encounter (Signed)
Pt's daughter is requesting a callback regarding pt's medications. She has some questions and concerns. Please advise

## 2022-11-30 NOTE — Progress Notes (Signed)
I,Jameka J Llittleton, CMA,acting as a Neurosurgeon for [employer], NP.,have documented all relevant documentation on the behalf of Ellender Hose, NP,as directed by  Ellender Hose, NP while in the presence of Ellender Hose, NP.  Subjective:  Patient ID: [name] [name] , female    DOB: 12-19-1940 , 82 y.o.   MRN: [id number]  Chief Complaint  Patient presents with   Rash    HPI  Patient presents today for a wound in her left lower leg. The patient is accompanied this visit with her daughter and they state that she was getting into a truck about a week ago and scrapped her leg. Daughter states she did not get to know until days later and she went to Urgent care last Friday where she was given Bactrim DS and she started taking the medication on Sunday, but daughter states it seems like the medication is irritating her stomach so she is not eating or drinking a lot making her weak and not helping the leg much. She reports her leg seem to be hurting more and is red around it.Daughter states she was given IV fluids yesterday by EMS.      Past Medical History:  Diagnosis Date   Acute kidney injury superimposed on chronic kidney disease (HCC) 12/02/2021   Acute respiratory failure with hypoxia (HCC) 09/03/2021   Anemia due to chronic kidney disease 09/03/2021   BPPV (benign paroxysmal positional vertigo), left 01/24/2021   Chronic diastolic CHF (congestive heart failure) (HCC) 09/02/2021   Combined forms of age-related cataract of both eyes 08/24/2016   CVA, old, hemiparesis (HCC) 02/06/2013   Diabetes mellitus (HCC)    Diabetic macular edema (HCC) 08/22/2012   Diabetic retinopathy associated with type 2 diabetes mellitus (HCC) 09/20/2020   Essential hypertension    Gait abnormality 12/26/2021   H/O: CVA (cerebrovascular accident) 04/10/2000   History of falling 12/26/2021   HTN (hypertension)    Hyperlipidemia    Hypermagnesemia 12/02/2021   Hypertensive heart and renal disease 09/20/2020    Hypertensive urgency 09/02/2021   Hypokalemia 12/02/2021   Left ventricular diastolic dysfunction, NYHA class 1 02/06/2013   NVG (neovascular glaucoma), left, severe stage 07/24/2019   Overweight (BMI 25.0-29.9) 10/09/2018   Poorly controlled type 2 diabetes mellitus with circulatory disorder (HCC)    Prolonged QT interval 12/02/2021   Pulmonary HTN (HCC)    mild by echo   Sensorineural hearing loss (SNHL), bilateral 01/24/2021   Stage 3b chronic kidney disease (HCC) 12/26/2021   Syncope and collapse 12/01/2021     Family History  Problem Relation Age of Onset   Hypertension Mother    Hypertension Father    Heart Problems Father    Hypertension Sister    Diabetes Sister    Hypertension Brother    Diabetes Brother    Breast cancer Neg Hx     No current facility-administered medications for this visit.  Current Outpatient Medications:    insulin lispro (HUMALOG KWIKPEN) 200 UNIT/ML KwikPen, INJECT 8-10 UNITS SUBCUTANEOUSLY 15 MINUTES BEFORE 3 MAIN MEALS OF THE DAY *PATIENT NEEDS APPOINTMENT* *REFILL REQUEST*, Disp: 3 mL, Rfl: 0  Facility-Administered Medications Ordered in Other Visits:    acetaminophen (TYLENOL) tablet 650 mg, 650 mg, Oral, Q6H PRN, 650 mg at 12/08/22 2028 **OR** acetaminophen (TYLENOL) suppository 650 mg, 650 mg, Rectal, Q6H PRN, Sherryll Burger, Pratik D, DO   alteplase (CATHFLO ACTIVASE) injection 2 mg, 2 mg, Intracatheter, Once PRN, Terrial Rhodes, MD   alum & mag hydroxide-simeth (MAALOX/MYLANTA) 200-200-20 MG/5ML suspension  30 mL, 30 mL, Oral, Q6H PRN, Vassie Loll, MD, 30 mL at 12/08/22 1551   amLODipine (NORVASC) tablet 5 mg, 5 mg, Oral, Daily, Sherryll Burger, Pratik D, DO, 5 mg at 12/07/22 1404   atorvastatin (LIPITOR) tablet 20 mg, 20 mg, Oral, Daily, Sherryll Burger, Pratik D, DO, 20 mg at 12/09/22 1014   budesonide (PULMICORT) nebulizer solution 0.25 mg, 0.25 mg, Nebulization, BID, Sherryll Burger, Pratik D, DO, 0.25 mg at 12/09/22 0851   cefTRIAXone (ROCEPHIN) 1 g in sodium chloride 0.9  % 100 mL IVPB, 1 g, Intravenous, Q24H, Shah, Pratik D, DO, Last Rate: 200 mL/hr at 12/08/22 1701, 1 g at 12/08/22 1701   Chlorhexidine Gluconate Cloth 2 % PADS 6 each, 6 each, Topical, Q0600, Terrial Rhodes, MD, 6 each at 12/09/22 0545   doxycycline (VIBRAMYCIN) 100 mg in sodium chloride 0.9 % 250 mL IVPB, 100 mg, Intravenous, Q12H, Sherryll Burger, Pratik D, DO, Last Rate: 125 mL/hr at 12/09/22 1006, 100 mg at 12/09/22 1006   famotidine (PEPCID) tablet 20 mg, 20 mg, Oral, QHS, Shah, Pratik D, DO, 20 mg at 12/08/22 2100   feeding supplement (NEPRO CARB STEADY) liquid 237 mL, 237 mL, Oral, PRN, Terrial Rhodes, MD   fluticasone (FLONASE) 50 MCG/ACT nasal spray 1 spray, 1 spray, Each Nare, Daily, Shah, Pratik D, DO, 1 spray at 12/09/22 1011   guaiFENesin-dextromethorphan (ROBITUSSIN DM) 100-10 MG/5ML syrup 5 mL, 5 mL, Oral, Q4H PRN, Sherryll Burger, Pratik D, DO, 5 mL at 12/05/22 0940   heparin injection 1,000 Units, 1,000 Units, Intracatheter, PRN, Terrial Rhodes, MD, 3,200 Units at 12/08/22 1908   heparin injection 1,300 Units, 20 Units/kg, Dialysis, PRN, Terrial Rhodes, MD, 1,300 Units at 12/08/22 1625   hydrALAZINE (APRESOLINE) injection 10 mg, 10 mg, Intravenous, Q4H PRN, Sherryll Burger, Pratik D, DO, 10 mg at 12/06/22 0458   hydrALAZINE (APRESOLINE) tablet 25 mg, 25 mg, Oral, TID, Sherryll Burger, Pratik D, DO, 25 mg at 12/08/22 2100   hydrOXYzine (ATARAX) tablet 10 mg, 10 mg, Oral, TID PRN, Adefeso, Oladapo, DO, 10 mg at 12/05/22 0940   insulin aspart (novoLOG) injection 0-6 Units, 0-6 Units, Subcutaneous, TID WC, Vassie Loll, MD   insulin glargine-yfgn [facility]) injection 5 Units, 5 Units, Subcutaneous, Daily, Vassie Loll, MD, 5 Units at 12/08/22 1411   ipratropium-albuterol (DUONEB) 0.5-2.5 (3) MG/3ML nebulizer solution 3 mL, 3 mL, Nebulization, Q6H PRN, Sherryll Burger, Pratik D, DO   isosorbide mononitrate (IMDUR) 24 hr tablet 60 mg, 60 mg, Oral, Daily, Sherryll Burger, Pratik D, DO, 60 mg at 12/07/22 1405   latanoprost (XALATAN)  0.005 % ophthalmic solution 1 drop, 1 drop, Both Eyes, QHS, Shah, Pratik D, DO, 1 drop at 12/08/22 2100   loratadine (CLARITIN) tablet 10 mg, 10 mg, Oral, Daily, Sherryll Burger, Pratik D, DO, 10 mg at 12/09/22 1013   metoprolol succinate (TOPROL-XL) 24 hr tablet 12.5 mg, 12.5 mg, Oral, Daily, Sherryll Burger, Pratik D, DO, 12.5 mg at 12/07/22 1405   mupirocin ointment (BACTROBAN) 2 % 1 Application, 1 Application, Nasal, BID, Lucretia Roers, MD, 1 Application at 12/09/22 1013   phenol (CHLORASEPTIC) mouth spray 1 spray, 1 spray, Mouth/Throat, PRN, Lucretia Roers, MD, 1 spray at 12/08/22 2114   polyethylene glycol (MIRALAX / GLYCOLAX) packet 17 g, 17 g, Oral, Daily PRN, Vassie Loll, MD, 17 g at 12/09/22 1206   prochlorperazine (COMPAZINE) injection 10 mg, 10 mg, Intravenous, Q6H PRN, Vassie Loll, MD, 10 mg at 12/09/22 0956   sodium bicarbonate 150 mEq in dextrose 5 % 1,150 mL infusion, , Intravenous, Continuous, Vassie Loll,  MD, Last Rate: 75 mL/hr at 12/08/22 1920, New Bag at 12/08/22 1920   Allergies  Allergen Reactions   Hydralazine Itching   Brimonidine Other (See Comments)    Eyes swell and itch   Dorzolamide Other (See Comments)    Other reaction(s): Other (See Comments)  Red, inflamed eyelid, dermatitis   Latex     Blisters/ skin irritation   Timolol Other (See Comments)    Caused eyes to swell and itch     Review of Systems  Constitutional:  Positive for fatigue. Negative for fever.  HENT: Negative.    Respiratory: Negative.    Cardiovascular: Negative.   Gastrointestinal: Negative.   Genitourinary: Negative.   Musculoskeletal: Negative.   Skin:  Positive for color change and wound.  Psychiatric/Behavioral: Negative.       Today's Vitals   11/30/22 1437  BP: (!) 160/60  Pulse: 77  Temp: 98.1 F (36.7 C)  PainSc: 6   PainLoc: Leg   There is no height or weight on file to calculate BMI.  Wt Readings from Last 3 Encounters:  12/07/22 142 lb 3.2 oz (64.5 kg)  12/02/22  142 lb (64.4 kg)  10/27/22 143 lb 12.8 oz (65.2 kg)    The ASCVD Risk score (Arnett DK, et al., 2019) failed to calculate for the following reasons:   The 2019 ASCVD risk score is only valid for ages 17 to 71   The patient has a prior MI or stroke diagnosis  Objective:  Physical Exam HENT:     Head: Normocephalic.  Cardiovascular:     Rate and Rhythm: Normal rate.  Pulmonary:     Effort: Pulmonary effort is normal.  Skin:    Findings: Wound present.          Comments: Quarter size wound on LLE, with eschar like tissue forming on the surface.Surrounding areas red and painful  Neurological:     Mental Status: She is alert.         Assessment And Plan:  Wound of left lower extremity, subsequent encounter Assessment & Plan: D/C Bactrim DS; Rocephin IM 1g given in the office; Take Doxycycline  BID for 5 days.  Orders: -     traMADol HCl; Take 1 tablet (50 mg total) by mouth every 8 (eight) hours as needed for up to 5 days.  Dispense: 15 tablet; Refill: 0 -     Doxycycline Hyclate; Take 1 tablet (100 mg total) by mouth 2 (two) times daily for 5 days.  Dispense: 10 tablet; Refill: 0 -     cefTRIAXone Sodium  Encounter for wound care Assessment & Plan: [facility] Nurse notified about need for wound care  Orders: -     Change dressing -     Ambulatory referral to Home Health    Return if symptoms worsen or fail to improve.  Patient was given opportunity to ask questions. Patient verbalized understanding of the plan and was able to repeat key elements of the plan. All questions were answered to their satisfaction.    I, Ellender Hose, NP, have reviewed all documentation for this visit. The documentation on 12/09/22 for the exam, diagnosis, procedures, and orders are all accurate and complete.   IF YOU HAVE BEEN REFERRED TO A SPECIALIST, IT MAY TAKE 1-2 WEEKS TO SCHEDULE/PROCESS THE REFERRAL. IF YOU HAVE NOT HEARD FROM US/SPECIALIST IN TWO WEEKS, PLEASE GIVE [place] A  CALL AT [phone] X [phone].

## 2022-11-30 NOTE — Telephone Encounter (Signed)
We did send MyChart message 11/10/22 to check in on BP after reducing Hydralazine, adding Imdur. Appears they did not read.   Goal to gradually increase Imdur, decrease Hydralazine.   Increase Imdur to 60mg  daily, reduce Hydralazine to 25mg  TID. Recommend they call us in one week to report back BP and we will consider discontinuing Hydralazine at that time.   Alver Sorrow, NP

## 2022-11-30 NOTE — Telephone Encounter (Signed)
Spoke with daughter regarding Hydralazine side effects, skin feeling gritty (see 7/21 mychart message) Daughter thinks may even be worse than before Did start Isosorbide as recommended SBP still running in 160's-170's  Daughter wants patient off Hydralazine    Will forward to Ronn Melena NP

## 2022-12-02 ENCOUNTER — Emergency Department (HOSPITAL_COMMUNITY)
Admission: EM | Admit: 2022-12-02 | Discharge: 2022-12-02 | Disposition: A | Discharge status: Home / Self Care | Payer: Medicare Other | Source: Home / Self Care | Attending: Emergency Medicine | Admitting: Emergency Medicine

## 2022-12-02 ENCOUNTER — Encounter (HOSPITAL_COMMUNITY): Payer: Self-pay

## 2022-12-02 ENCOUNTER — Other Ambulatory Visit: Payer: Self-pay

## 2022-12-02 ENCOUNTER — Emergency Department (HOSPITAL_COMMUNITY): Payer: Medicare Other

## 2022-12-02 DIAGNOSIS — E1165 Type 2 diabetes mellitus with hyperglycemia: Secondary | ICD-10-CM | POA: Diagnosis not present

## 2022-12-02 DIAGNOSIS — R0602 Shortness of breath: Secondary | ICD-10-CM | POA: Diagnosis not present

## 2022-12-02 DIAGNOSIS — I469 Cardiac arrest, cause unspecified: Secondary | ICD-10-CM | POA: Diagnosis not present

## 2022-12-02 DIAGNOSIS — I953 Hypotension of hemodialysis: Secondary | ICD-10-CM | POA: Diagnosis not present

## 2022-12-02 DIAGNOSIS — J189 Pneumonia, unspecified organism: Secondary | ICD-10-CM | POA: Diagnosis not present

## 2022-12-02 DIAGNOSIS — N189 Chronic kidney disease, unspecified: Secondary | ICD-10-CM | POA: Insufficient documentation

## 2022-12-02 DIAGNOSIS — E860 Dehydration: Secondary | ICD-10-CM | POA: Diagnosis not present

## 2022-12-02 DIAGNOSIS — N186 End stage renal disease: Secondary | ICD-10-CM | POA: Diagnosis not present

## 2022-12-02 DIAGNOSIS — E872 Acidosis, unspecified: Secondary | ICD-10-CM | POA: Diagnosis not present

## 2022-12-02 DIAGNOSIS — R918 Other nonspecific abnormal finding of lung field: Secondary | ICD-10-CM | POA: Diagnosis not present

## 2022-12-02 DIAGNOSIS — E1122 Type 2 diabetes mellitus with diabetic chronic kidney disease: Secondary | ICD-10-CM | POA: Diagnosis not present

## 2022-12-02 DIAGNOSIS — S8012XA Contusion of left lower leg, initial encounter: Secondary | ICD-10-CM

## 2022-12-02 DIAGNOSIS — I480 Paroxysmal atrial fibrillation: Secondary | ICD-10-CM | POA: Diagnosis not present

## 2022-12-02 DIAGNOSIS — E1140 Type 2 diabetes mellitus with diabetic neuropathy, unspecified: Secondary | ICD-10-CM | POA: Insufficient documentation

## 2022-12-02 DIAGNOSIS — N179 Acute kidney failure, unspecified: Secondary | ICD-10-CM | POA: Diagnosis not present

## 2022-12-02 DIAGNOSIS — M7989 Other specified soft tissue disorders: Secondary | ICD-10-CM | POA: Diagnosis not present

## 2022-12-02 DIAGNOSIS — S81809A Unspecified open wound, unspecified lower leg, initial encounter: Secondary | ICD-10-CM | POA: Diagnosis not present

## 2022-12-02 DIAGNOSIS — I5032 Chronic diastolic (congestive) heart failure: Secondary | ICD-10-CM | POA: Diagnosis not present

## 2022-12-02 DIAGNOSIS — R0902 Hypoxemia: Secondary | ICD-10-CM | POA: Diagnosis not present

## 2022-12-02 DIAGNOSIS — L03116 Cellulitis of left lower limb: Secondary | ICD-10-CM | POA: Diagnosis not present

## 2022-12-02 DIAGNOSIS — Z91040 Latex allergy status: Secondary | ICD-10-CM | POA: Insufficient documentation

## 2022-12-02 DIAGNOSIS — I132 Hypertensive heart and chronic kidney disease with heart failure and with stage 5 chronic kidney disease, or end stage renal disease: Secondary | ICD-10-CM | POA: Diagnosis not present

## 2022-12-02 DIAGNOSIS — Y92812 Truck as the place of occurrence of the external cause: Secondary | ICD-10-CM | POA: Insufficient documentation

## 2022-12-02 DIAGNOSIS — Z992 Dependence on renal dialysis: Secondary | ICD-10-CM | POA: Diagnosis not present

## 2022-12-02 DIAGNOSIS — Z7901 Long term (current) use of anticoagulants: Secondary | ICD-10-CM | POA: Insufficient documentation

## 2022-12-02 DIAGNOSIS — I509 Heart failure, unspecified: Secondary | ICD-10-CM | POA: Diagnosis not present

## 2022-12-02 DIAGNOSIS — W228XXA Striking against or struck by other objects, initial encounter: Secondary | ICD-10-CM | POA: Insufficient documentation

## 2022-12-02 DIAGNOSIS — Z1152 Encounter for screening for COVID-19: Secondary | ICD-10-CM | POA: Diagnosis not present

## 2022-12-02 DIAGNOSIS — J9601 Acute respiratory failure with hypoxia: Secondary | ICD-10-CM | POA: Diagnosis not present

## 2022-12-02 DIAGNOSIS — S81812A Laceration without foreign body, left lower leg, initial encounter: Secondary | ICD-10-CM | POA: Diagnosis not present

## 2022-12-02 DIAGNOSIS — E11649 Type 2 diabetes mellitus with hypoglycemia without coma: Secondary | ICD-10-CM | POA: Diagnosis not present

## 2022-12-02 DIAGNOSIS — E782 Mixed hyperlipidemia: Secondary | ICD-10-CM | POA: Diagnosis not present

## 2022-12-02 DIAGNOSIS — E43 Unspecified severe protein-calorie malnutrition: Secondary | ICD-10-CM | POA: Diagnosis not present

## 2022-12-02 DIAGNOSIS — Z794 Long term (current) use of insulin: Secondary | ICD-10-CM | POA: Insufficient documentation

## 2022-12-02 DIAGNOSIS — M85862 Other specified disorders of bone density and structure, left lower leg: Secondary | ICD-10-CM | POA: Diagnosis not present

## 2022-12-02 DIAGNOSIS — D631 Anemia in chronic kidney disease: Secondary | ICD-10-CM | POA: Diagnosis not present

## 2022-12-02 MED ORDER — LIDOCAINE HCL (PF) 1 % IJ SOLN
30.0000 mL | Freq: Once | INTRAMUSCULAR | Status: AC
  Administered 2022-12-02: 30 mL
  Filled 2022-12-02: qty 30

## 2022-12-02 NOTE — ED Triage Notes (Addendum)
POV/ brought in by daughter from home/ pt hit left shin x1 week ago on truck running board/ black spot inside wound, yellow discharge/ no obvious swelling or warmth/ daughter denies fever/ pt is currently taking abx for wound/ pt is A&Ox 3 at baseline/ pt is a diabetic

## 2022-12-02 NOTE — ED Notes (Signed)
Cleanse wound with NS applied gauze and tape

## 2022-12-02 NOTE — ED Notes (Addendum)
O2 sats in triage were 88-91, patient does not use supplemental oxygen.

## 2022-12-02 NOTE — ED Provider Notes (Cosign Needed Addendum)
[place] EMERGENCY DEPARTMENT AT [facility] Provider Note   CSN: [id number] Arrival date & time: 12/02/22  1820     History  Chief Complaint  Patient presents with   Wound Check    [name] [name] is a 82 y.o. female.  PMH of CKD, diabetes, neuropathy.  Presents the ER for wound to the left lower leg.  This occurred 10 days ago.  She scraped it getting out of a truck on the running board, did not notice until 2 days later.  Has had some redness and swelling to the area with mild to yellow in the center.  She started by urgent care on Bactrim which her daughter states she felt was being her dehydrated, seen by PCP 2 days ago given Rocephin and started on doxycycline.    Wound Check       Home Medications Prior to Admission medications   Medication Sig Start Date End Date Taking? Authorizing Provider  ACCU-CHEK AVIVA PLUS test strip TEST THREE TIMES DAILY AS DIRECTED 07/20/20   Dorothyann Peng, MD  amLODipine (NORVASC) 5 MG tablet TAKE ONE TABLET BY MOUTH ONCE DAILY 11/15/22   Lennette Bihari, MD  apixaban (ELIQUIS) 2.5 MG TABS tablet TAKE ONE TABLET BY MOUTH TWICE DAILY 08/18/22   Lennette Bihari, MD  atorvastatin (LIPITOR) 20 MG tablet Take 1 tablet (20 mg total) by mouth daily. 03/31/22 03/31/23  Dorothyann Peng, MD  azelastine (ASTELIN) 0.1 % nasal spray Place 1 spray into both nostrils 2 (two) times daily. Use in each nostril as directed 09/07/21   Dorothyann Peng, MD  B-D UF III MINI PEN NEEDLES 31G X 5 MM MISC Use as directed 4x a day 12/13/21   Dorothyann Peng, MD  blood glucose meter kit and supplies Dispense based on patient and insurance preference. Use up to four times daily as directed.dx code e11.65 03/08/21   Dorothyann Peng, MD  Blood Glucose Monitoring Suppl (TRUE METRIX METER) DEVI Use to check blood sugars 3 times a day. 03/08/21   Dorothyann Peng, MD  cetirizine (ZYRTEC ALLERGY) 10 MG tablet Take 1 tablet (10 mg total) by mouth daily. 06/12/22 06/12/23  Dorothyann Peng, MD  Cholecalciferol (VITAMIN D3) 50 MCG (2000 UT) TABS Take 1 tablet by mouth daily at 12 noon.    [provider]  doxycycline (VIBRA-TABS) 100 MG tablet Take 1 tablet (100 mg total) by mouth 2 (two) times daily for 5 days. 11/30/22 12/05/22  Ellender Hose, NP  Ferrous Sulfate (IRON PO) Take 65 mg by mouth daily.    [provider]  furosemide (LASIX) 20 MG tablet Take 1 tablet (20 mg total) by mouth as needed (for swelling once daily). 08/29/22   Lennette Bihari, MD  glucose blood (TRUE METRIX BLOOD GLUCOSE TEST) test strip Use as instructed to check blood sugar 3 times a day. Dx code e11.65 03/08/21   Dorothyann Peng, MD  hydrALAZINE (APRESOLINE) 25 MG tablet Take 25 mg by mouth 3 (three) times daily.    [provider]  hydrOXYzine (ATARAX) 25 MG tablet Take 1 tablet (25 mg total) by mouth 3 (three) times daily as needed for itching. 12/16/21   Dorothyann Peng, MD  insulin lispro (HUMALOG KWIKPEN) 200 UNIT/ML KwikPen inject 8-10 units into THE SKIN 15 minutes BEFORE THREE main meals of THE DAY 02/10/22   Carlus Pavlov, MD  isosorbide mononitrate (IMDUR) 30 MG 24 hr tablet Take 60 mg by mouth daily.    [provider]  Lancets (ACCU-CHEK SOFT TOUCH) lancets Check blood sugars 3 times daily E11.65 02/12/18   Dorothyann Peng, MD  latanoprost (XALATAN) 0.005 % ophthalmic solution Place 1 drop into both eyes at bedtime.  03/17/19   [provider]  metoprolol succinate (TOPROL-XL) 25 MG 24 hr tablet TAKE 1/2 TABLET BY MOUTH ONCE DAILY 11/15/22   Lennette Bihari, MD  Multiple Vitamin (MULTIVITAMIN) tablet Take 1 tablet by mouth daily.    [provider]  Semaglutide, 1 MG/DOSE, (OZEMPIC, 1 MG/DOSE,) 2 MG/1.5ML SOPN Inject 1 mg into the skin once a week. 05/20/21   Carlus Pavlov, MD  traMADol (ULTRAM) 50 MG tablet Take 1 tablet (50 mg total) by mouth every 8 (eight) hours as needed for up to 5 days. 11/30/22 12/05/22  Ellender Hose, NP  TRESIBA  FLEXTOUCH 200 UNIT/ML FlexTouch Pen inject 20 units into THE SKIN daily 03/20/22   Dorothyann Peng, MD      Allergies    Brimonidine, Dorzolamide, Latex, and Timolol    Review of Systems   Review of Systems  Physical Exam Updated Vital Signs BP (!) 167/83 (BP Location: Right Arm)   Pulse 70   Temp 98.3 F (36.8 C) (Oral)   Resp 16   Wt 64.4 kg   SpO2 96%   BMI 25.97 kg/m  Physical Exam Vitals and nursing note reviewed.  Constitutional:      General: She is not in acute distress.    Appearance: She is well-developed.  HENT:     Head: Normocephalic and atraumatic.     Mouth/Throat:     Mouth: Mucous membranes are moist.  Eyes:     Conjunctiva/sclera: Conjunctivae normal.  Cardiovascular:     Rate and Rhythm: Normal rate and regular rhythm.     Heart sounds: No murmur heard. Pulmonary:     Effort: Pulmonary effort is normal. No respiratory distress.     Breath sounds: Normal breath sounds.  Abdominal:     Palpations: Abdomen is soft.     Tenderness: There is no abdominal tenderness.  Musculoskeletal:        General: Swelling present.     Cervical back: Neck supple.     Comments: Mild swelling left anterior shin with overlying mild erythema and some necrotic skin superficially  Skin:    General: Skin is warm and dry.     Capillary Refill: Capillary refill takes less than 2 seconds.  Neurological:     General: No focal deficit present.     Mental Status: She is alert and oriented to person, place, and time.  Psychiatric:        Mood and Affect: Mood normal.     ED Results / Procedures / Treatments   Labs (all labs ordered are listed, but only abnormal results are displayed) Labs Reviewed - No data to display  EKG None  Radiology DG Tibia/Fibula Left  Result Date: 12/02/2022 CLINICAL DATA:  Mid lower leg wound for 10 days EXAM: LEFT TIBIA AND FIBULA - 2 VIEW COMPARISON:  None Available. FINDINGS: There is no evidence of fracture or other focal bone lesions.  Osteopenia. Vascular calcifications. Mild soft tissue swelling if there is further concern of bone or soft tissue infection, MRI or bone scan could be performed for further sensitivity IMPRESSION: No acute osseous abnormality.  Mild soft tissue swelling Electronically Signed   By: Karen Kays M.D.   On: 12/02/2022 19:35    Procedures .Marland KitchenIncision and Drainage  Date/Time: 12/02/2022 8:35 PM  Performed  by: Ma Rings, PA-C Authorized by: Ma Rings, PA-C   Consent:    Consent obtained:  Verbal   Consent given by:  Patient   Risks discussed:  Bleeding, incomplete drainage, pain and infection Universal protocol:    Imaging studies available: yes     Patient identity confirmed:  Verbally with patient Location:    Type:  Hematoma   Size:  2 cm   Location:  Lower extremity   Lower extremity location:  Leg   Leg location:  L lower leg Pre-procedure details:    Skin preparation:  Povidone-iodine Sedation:    Sedation type:  None Anesthesia:    Anesthesia method:  Local infiltration   Local anesthetic:  Lidocaine 1% w/o epi Procedure type:    Complexity:  Simple Procedure details:    Ultrasound guidance: no     Needle aspiration: no     Incision types:  Single straight   Drainage:  Bloody   Drainage amount:  Moderate   Wound treatment:  Wound left open   Packing materials:  None Post-procedure details:    Procedure completion:  Tolerated well, no immediate complications     Medications Ordered in ED Medications  lidocaine (PF) (XYLOCAINE) 1 % injection 30 mL (30 mLs Infiltration Given 12/02/22 1948)    ED Course/ Medical Decision Making/ A&P                                 Medical Decision Making DDx: Abscess, cellulitis, contusion, hematoma, other ED course: This is an 82 year old female with a wound to her left lower buttock for the past 10 days, on antibiotics-doxycycline currently.  Daughter is worried that it is not getting better.  She is small area of  fluctuance.  I&D was done and this was noted to be hematoma, no purulent drainage.  The hematoma was evacuated.  She is going to continue the course of doxycycline.  Home health and wound care have already been consulted by patient's PCP advised on wound check in a couple days with primary care and strict return precautions.  She is well-appearing, well-hydrated with reassuring vitals and has no systemic symptoms.  Do not feel she needs further workup.  Imaging: I ordered a left tibia and fibula x-ray.  I visualized independently interpreted this image, no bony abnormality, no soft tissue gas.  There is some mild soft tissue swelling consistent with a hematoma as above.  Amount and/or Complexity of Data Reviewed External Data Reviewed: notes. Radiology: ordered and independent interpretation performed. Decision-making details documented in ED Course.  Risk Prescription drug management.           Final Clinical Impression(s) / ED Diagnoses Final diagnoses:  Hematoma of left lower leg    Rx / DC Orders ED Discharge Orders     None         Ma Rings, PA-C 12/02/22 [address] 12/02/22 2036    Eber Hong, MD 12/03/22 1358

## 2022-12-02 NOTE — Discharge Instructions (Signed)
We drained the hematoma on your leg.  Keep this area clean and dry and keep it covered.  Continue the antibiotics.  Follow-up with your primary care doctor a couple days for recheck.  May continue draining a small amount of blood.  Develop fever, increased pain or swelling or any other new or worsening symptoms you to come to the ER.

## 2022-12-04 ENCOUNTER — Emergency Department (HOSPITAL_COMMUNITY): Payer: Medicare Other

## 2022-12-04 ENCOUNTER — Inpatient Hospital Stay (HOSPITAL_COMMUNITY)
Admission: EM | Admit: 2022-12-04 | Discharge: 2023-01-09 | DRG: 193 | Disposition: E | Payer: Medicare Other | Attending: Internal Medicine | Admitting: Internal Medicine

## 2022-12-04 ENCOUNTER — Encounter (HOSPITAL_COMMUNITY): Payer: Self-pay

## 2022-12-04 ENCOUNTER — Other Ambulatory Visit: Payer: Self-pay

## 2022-12-04 DIAGNOSIS — N189 Chronic kidney disease, unspecified: Secondary | ICD-10-CM | POA: Diagnosis present

## 2022-12-04 DIAGNOSIS — R0902 Hypoxemia: Secondary | ICD-10-CM

## 2022-12-04 DIAGNOSIS — I480 Paroxysmal atrial fibrillation: Secondary | ICD-10-CM | POA: Diagnosis present

## 2022-12-04 DIAGNOSIS — I469 Cardiac arrest, cause unspecified: Secondary | ICD-10-CM | POA: Diagnosis not present

## 2022-12-04 DIAGNOSIS — S81802A Unspecified open wound, left lower leg, initial encounter: Secondary | ICD-10-CM | POA: Diagnosis present

## 2022-12-04 DIAGNOSIS — I953 Hypotension of hemodialysis: Secondary | ICD-10-CM | POA: Diagnosis not present

## 2022-12-04 DIAGNOSIS — E860 Dehydration: Secondary | ICD-10-CM | POA: Diagnosis present

## 2022-12-04 DIAGNOSIS — J189 Pneumonia, unspecified organism: Principal | ICD-10-CM | POA: Diagnosis present

## 2022-12-04 DIAGNOSIS — Z7985 Long-term (current) use of injectable non-insulin antidiabetic drugs: Secondary | ICD-10-CM

## 2022-12-04 DIAGNOSIS — J948 Other specified pleural conditions: Secondary | ICD-10-CM | POA: Diagnosis not present

## 2022-12-04 DIAGNOSIS — L03116 Cellulitis of left lower limb: Secondary | ICD-10-CM | POA: Diagnosis present

## 2022-12-04 DIAGNOSIS — D631 Anemia in chronic kidney disease: Secondary | ICD-10-CM | POA: Diagnosis not present

## 2022-12-04 DIAGNOSIS — E872 Acidosis, unspecified: Secondary | ICD-10-CM | POA: Diagnosis present

## 2022-12-04 DIAGNOSIS — N184 Chronic kidney disease, stage 4 (severe): Secondary | ICD-10-CM | POA: Diagnosis not present

## 2022-12-04 DIAGNOSIS — E43 Unspecified severe protein-calorie malnutrition: Secondary | ICD-10-CM | POA: Diagnosis present

## 2022-12-04 DIAGNOSIS — E1165 Type 2 diabetes mellitus with hyperglycemia: Secondary | ICD-10-CM | POA: Diagnosis present

## 2022-12-04 DIAGNOSIS — E11649 Type 2 diabetes mellitus with hypoglycemia without coma: Secondary | ICD-10-CM | POA: Diagnosis not present

## 2022-12-04 DIAGNOSIS — E1140 Type 2 diabetes mellitus with diabetic neuropathy, unspecified: Secondary | ICD-10-CM | POA: Diagnosis present

## 2022-12-04 DIAGNOSIS — I5030 Unspecified diastolic (congestive) heart failure: Secondary | ICD-10-CM | POA: Diagnosis not present

## 2022-12-04 DIAGNOSIS — I517 Cardiomegaly: Secondary | ICD-10-CM | POA: Diagnosis not present

## 2022-12-04 DIAGNOSIS — S81812A Laceration without foreign body, left lower leg, initial encounter: Secondary | ICD-10-CM | POA: Diagnosis present

## 2022-12-04 DIAGNOSIS — R918 Other nonspecific abnormal finding of lung field: Secondary | ICD-10-CM | POA: Diagnosis not present

## 2022-12-04 DIAGNOSIS — N186 End stage renal disease: Secondary | ICD-10-CM | POA: Diagnosis present

## 2022-12-04 DIAGNOSIS — I5031 Acute diastolic (congestive) heart failure: Secondary | ICD-10-CM | POA: Diagnosis not present

## 2022-12-04 DIAGNOSIS — E876 Hypokalemia: Secondary | ICD-10-CM | POA: Diagnosis present

## 2022-12-04 DIAGNOSIS — R0602 Shortness of breath: Secondary | ICD-10-CM | POA: Diagnosis not present

## 2022-12-04 DIAGNOSIS — J9601 Acute respiratory failure with hypoxia: Secondary | ICD-10-CM | POA: Diagnosis not present

## 2022-12-04 DIAGNOSIS — E782 Mixed hyperlipidemia: Secondary | ICD-10-CM | POA: Diagnosis present

## 2022-12-04 DIAGNOSIS — I132 Hypertensive heart and chronic kidney disease with heart failure and with stage 5 chronic kidney disease, or end stage renal disease: Secondary | ICD-10-CM | POA: Diagnosis present

## 2022-12-04 DIAGNOSIS — E669 Obesity, unspecified: Secondary | ICD-10-CM | POA: Diagnosis present

## 2022-12-04 DIAGNOSIS — Z794 Long term (current) use of insulin: Secondary | ICD-10-CM | POA: Diagnosis not present

## 2022-12-04 DIAGNOSIS — Z8249 Family history of ischemic heart disease and other diseases of the circulatory system: Secondary | ICD-10-CM

## 2022-12-04 DIAGNOSIS — J811 Chronic pulmonary edema: Secondary | ICD-10-CM | POA: Diagnosis not present

## 2022-12-04 DIAGNOSIS — E1122 Type 2 diabetes mellitus with diabetic chronic kidney disease: Secondary | ICD-10-CM | POA: Diagnosis present

## 2022-12-04 DIAGNOSIS — Z1152 Encounter for screening for COVID-19: Secondary | ICD-10-CM | POA: Diagnosis not present

## 2022-12-04 DIAGNOSIS — Z992 Dependence on renal dialysis: Secondary | ICD-10-CM

## 2022-12-04 DIAGNOSIS — I509 Heart failure, unspecified: Secondary | ICD-10-CM | POA: Diagnosis not present

## 2022-12-04 DIAGNOSIS — Z7189 Other specified counseling: Secondary | ICD-10-CM | POA: Diagnosis not present

## 2022-12-04 DIAGNOSIS — N185 Chronic kidney disease, stage 5: Secondary | ICD-10-CM | POA: Diagnosis not present

## 2022-12-04 DIAGNOSIS — Z91040 Latex allergy status: Secondary | ICD-10-CM

## 2022-12-04 DIAGNOSIS — Z7901 Long term (current) use of anticoagulants: Secondary | ICD-10-CM

## 2022-12-04 DIAGNOSIS — Z888 Allergy status to other drugs, medicaments and biological substances status: Secondary | ICD-10-CM

## 2022-12-04 DIAGNOSIS — R846 Abnormal cytological findings in specimens from respiratory organs and thorax: Secondary | ICD-10-CM | POA: Diagnosis not present

## 2022-12-04 DIAGNOSIS — I272 Pulmonary hypertension, unspecified: Secondary | ICD-10-CM | POA: Diagnosis present

## 2022-12-04 DIAGNOSIS — Z6825 Body mass index (BMI) 25.0-25.9, adult: Secondary | ICD-10-CM

## 2022-12-04 DIAGNOSIS — E119 Type 2 diabetes mellitus without complications: Secondary | ICD-10-CM | POA: Diagnosis not present

## 2022-12-04 DIAGNOSIS — J90 Pleural effusion, not elsewhere classified: Secondary | ICD-10-CM | POA: Diagnosis not present

## 2022-12-04 DIAGNOSIS — Z833 Family history of diabetes mellitus: Secondary | ICD-10-CM

## 2022-12-04 DIAGNOSIS — Z79899 Other long term (current) drug therapy: Secondary | ICD-10-CM

## 2022-12-04 DIAGNOSIS — I5032 Chronic diastolic (congestive) heart failure: Secondary | ICD-10-CM | POA: Diagnosis not present

## 2022-12-04 DIAGNOSIS — S81802D Unspecified open wound, left lower leg, subsequent encounter: Secondary | ICD-10-CM | POA: Diagnosis not present

## 2022-12-04 DIAGNOSIS — W458XXA Other foreign body or object entering through skin, initial encounter: Secondary | ICD-10-CM | POA: Diagnosis present

## 2022-12-04 DIAGNOSIS — N179 Acute kidney failure, unspecified: Secondary | ICD-10-CM | POA: Diagnosis present

## 2022-12-04 DIAGNOSIS — R6889 Other general symptoms and signs: Secondary | ICD-10-CM | POA: Diagnosis not present

## 2022-12-04 DIAGNOSIS — Z8673 Personal history of transient ischemic attack (TIA), and cerebral infarction without residual deficits: Secondary | ICD-10-CM

## 2022-12-04 DIAGNOSIS — E11319 Type 2 diabetes mellitus with unspecified diabetic retinopathy without macular edema: Secondary | ICD-10-CM | POA: Diagnosis present

## 2022-12-04 DIAGNOSIS — Z48813 Encounter for surgical aftercare following surgery on the respiratory system: Secondary | ICD-10-CM | POA: Diagnosis not present

## 2022-12-04 DIAGNOSIS — I700 Atherosclerosis of aorta: Secondary | ICD-10-CM | POA: Diagnosis not present

## 2022-12-04 DIAGNOSIS — M898X9 Other specified disorders of bone, unspecified site: Secondary | ICD-10-CM | POA: Diagnosis present

## 2022-12-04 DIAGNOSIS — R0989 Other specified symptoms and signs involving the circulatory and respiratory systems: Secondary | ICD-10-CM | POA: Diagnosis not present

## 2022-12-04 DIAGNOSIS — I10 Essential (primary) hypertension: Secondary | ICD-10-CM | POA: Diagnosis present

## 2022-12-04 DIAGNOSIS — Z515 Encounter for palliative care: Secondary | ICD-10-CM | POA: Diagnosis not present

## 2022-12-04 DIAGNOSIS — Z452 Encounter for adjustment and management of vascular access device: Secondary | ICD-10-CM | POA: Diagnosis not present

## 2022-12-04 DIAGNOSIS — S8012XA Contusion of left lower leg, initial encounter: Secondary | ICD-10-CM | POA: Diagnosis present

## 2022-12-04 DIAGNOSIS — Z743 Need for continuous supervision: Secondary | ICD-10-CM | POA: Diagnosis not present

## 2022-12-04 LAB — BLOOD GAS, VENOUS
Acid-base deficit: 11.8 mmol/L — ABNORMAL HIGH (ref 0.0–2.0)
Bicarbonate: 12.4 mmol/L — ABNORMAL LOW (ref 20.0–28.0)
Drawn by: 68414
O2 Saturation: 75.8 %
Patient temperature: 36.6
pCO2, Ven: 24 mmHg — ABNORMAL LOW (ref 44–60)
pH, Ven: 7.33 (ref 7.25–7.43)
pO2, Ven: 44 mmHg (ref 32–45)

## 2022-12-04 LAB — BRAIN NATRIURETIC PEPTIDE: B Natriuretic Peptide: >4500 pg/mL — ABNORMAL HIGH (ref 0.0–100.0)

## 2022-12-04 LAB — BASIC METABOLIC PANEL
Anion gap: 17 — ABNORMAL HIGH (ref 5–15)
BUN: 105 mg/dL — ABNORMAL HIGH (ref 8–23)
CO2: 11 mmol/L — ABNORMAL LOW (ref 22–32)
Calcium: 8.7 mg/dL — ABNORMAL LOW (ref 8.9–10.3)
Chloride: 107 mmol/L (ref 98–111)
Creatinine, Ser: 10.03 mg/dL — ABNORMAL HIGH (ref 0.44–1.00)
GFR, Estimated: 3 mL/min — ABNORMAL LOW (ref >60)
Glucose, Bld: 150 mg/dL — ABNORMAL HIGH (ref 70–99)
Potassium: 4.2 mmol/L (ref 3.5–5.1)
Sodium: 135 mmol/L (ref 135–145)

## 2022-12-04 LAB — MAGNESIUM: Magnesium: 3.2 mg/dL — ABNORMAL HIGH (ref 1.7–2.4)

## 2022-12-04 LAB — RESP PANEL BY RT-PCR (RSV, FLU A&B, COVID)  RVPGX2
Influenza A by PCR: NEGATIVE
Influenza B by PCR: NEGATIVE
Resp Syncytial Virus by PCR: NEGATIVE
SARS Coronavirus 2 by RT PCR: NEGATIVE

## 2022-12-04 LAB — CBC
HCT: 29.1 % — ABNORMAL LOW (ref 36.0–46.0)
Hemoglobin: 9.3 g/dL — ABNORMAL LOW (ref 12.0–15.0)
MCH: 28.6 pg (ref 26.0–34.0)
MCHC: 32 g/dL (ref 30.0–36.0)
MCV: 89.5 fL (ref 80.0–100.0)
Platelets: 460 10*3/uL — ABNORMAL HIGH (ref 150–400)
RBC: 3.25 MIL/uL — ABNORMAL LOW (ref 3.87–5.11)
RDW: 15.4 % (ref 11.5–15.5)
WBC: 9.8 10*3/uL (ref 4.0–10.5)
nRBC: 0 % (ref 0.0–0.2)

## 2022-12-04 LAB — GLUCOSE, CAPILLARY
Glucose-Capillary: 104 mg/dL — ABNORMAL HIGH (ref 70–99)
Glucose-Capillary: 110 mg/dL — ABNORMAL HIGH (ref 70–99)
Glucose-Capillary: 72 mg/dL (ref 70–99)
Glucose-Capillary: 96 mg/dL (ref 70–99)

## 2022-12-04 MED ORDER — INSULIN ASPART 100 UNIT/ML IJ SOLN: 0.0000 [IU] | INTRAMUSCULAR | Status: DC

## 2022-12-04 MED ORDER — BUDESONIDE 0.25 MG/2ML IN SUSP
0.2500 mg | Freq: Two times a day (BID) | RESPIRATORY_TRACT | Status: DC
  Administered 2022-12-04 – 2022-12-18 (×27): 0.25 mg via RESPIRATORY_TRACT
  Filled 2022-12-04 (×27): qty 2

## 2022-12-04 MED ORDER — IPRATROPIUM-ALBUTEROL 0.5-2.5 (3) MG/3ML IN SOLN
3.0000 mL | Freq: Four times a day (QID) | RESPIRATORY_TRACT | Status: DC | PRN
  Administered 2022-12-10: 3 mL via RESPIRATORY_TRACT
  Filled 2022-12-04: qty 3

## 2022-12-04 MED ORDER — SODIUM CHLORIDE 0.9 % IV SOLN
1.0000 g | INTRAVENOUS | Status: DC
  Administered 2022-12-05 – 2022-12-11 (×7): 1 g via INTRAVENOUS
  Filled 2022-12-04 (×7): qty 10

## 2022-12-04 MED ORDER — ACETAMINOPHEN 325 MG PO TABS
650.0000 mg | ORAL_TABLET | Freq: Four times a day (QID) | ORAL | Status: DC | PRN
  Administered 2022-12-04 – 2022-12-18 (×15): 650 mg via ORAL
  Filled 2022-12-04 (×17): qty 2

## 2022-12-04 MED ORDER — DOXYCYCLINE HYCLATE 100 MG PO TABS
100.0000 mg | ORAL_TABLET | Freq: Once | ORAL | Status: AC
  Administered 2022-12-04: 100 mg via ORAL
  Filled 2022-12-04: qty 1

## 2022-12-04 MED ORDER — HYDRALAZINE HCL 20 MG/ML IJ SOLN
10.0000 mg | INTRAMUSCULAR | Status: DC | PRN
  Administered 2022-12-06: 10 mg via INTRAVENOUS
  Filled 2022-12-04: qty 1

## 2022-12-04 MED ORDER — FAMOTIDINE 20 MG PO TABS
20.0000 mg | ORAL_TABLET | Freq: Every day | ORAL | Status: DC
  Administered 2022-12-04 – 2022-12-17 (×14): 20 mg via ORAL
  Filled 2022-12-04 (×14): qty 1

## 2022-12-04 MED ORDER — SODIUM CHLORIDE 0.9 % IV SOLN
100.0000 mg | Freq: Two times a day (BID) | INTRAVENOUS | Status: DC
  Administered 2022-12-05 – 2022-12-12 (×14): 100 mg via INTRAVENOUS
  Filled 2022-12-04 (×16): qty 100

## 2022-12-04 MED ORDER — SODIUM CHLORIDE 0.9 % IV SOLN
1.0000 g | Freq: Once | INTRAVENOUS | Status: AC
  Administered 2022-12-04: 1 g via INTRAVENOUS
  Filled 2022-12-04: qty 10

## 2022-12-04 MED ORDER — AMLODIPINE BESYLATE 5 MG PO TABS
5.0000 mg | ORAL_TABLET | Freq: Every day | ORAL | Status: DC
  Administered 2022-12-05 – 2022-12-15 (×9): 5 mg via ORAL
  Filled 2022-12-04 (×12): qty 1

## 2022-12-04 MED ORDER — ATORVASTATIN CALCIUM 20 MG PO TABS
20.0000 mg | ORAL_TABLET | Freq: Every day | ORAL | Status: DC
  Administered 2022-12-05 – 2022-12-18 (×14): 20 mg via ORAL
  Filled 2022-12-04 (×14): qty 1

## 2022-12-04 MED ORDER — HYDROXYZINE HCL 10 MG PO TABS
10.0000 mg | ORAL_TABLET | Freq: Three times a day (TID) | ORAL | Status: DC | PRN
  Administered 2022-12-04 – 2022-12-14 (×9): 10 mg via ORAL
  Filled 2022-12-04 (×10): qty 1

## 2022-12-04 MED ORDER — LACTATED RINGERS IV SOLN: INTRAVENOUS | Status: AC

## 2022-12-04 MED ORDER — FLUTICASONE PROPIONATE 50 MCG/ACT NA SUSP
1.0000 | Freq: Every day | NASAL | Status: DC
  Administered 2022-12-05 – 2022-12-18 (×14): 1 via NASAL
  Filled 2022-12-04 (×2): qty 16

## 2022-12-04 MED ORDER — LATANOPROST 0.005 % OP SOLN
1.0000 [drp] | Freq: Every day | OPHTHALMIC | Status: DC
  Administered 2022-12-04 – 2022-12-17 (×14): 1 [drp] via OPHTHALMIC
  Filled 2022-12-04 (×4): qty 2.5

## 2022-12-04 MED ORDER — GUAIFENESIN-DM 100-10 MG/5ML PO SYRP
5.0000 mL | ORAL_SOLUTION | ORAL | Status: DC | PRN
  Administered 2022-12-04 – 2022-12-10 (×4): 5 mL via ORAL
  Filled 2022-12-04 (×4): qty 5

## 2022-12-04 MED ORDER — POTASSIUM CHLORIDE CRYS ER 20 MEQ PO TBCR: 20.0000 meq | EXTENDED_RELEASE_TABLET | Freq: Once | ORAL | Status: DC

## 2022-12-04 MED ORDER — METOPROLOL SUCCINATE ER 25 MG PO TB24
12.5000 mg | ORAL_TABLET | Freq: Every day | ORAL | Status: DC
  Administered 2022-12-05 – 2022-12-17 (×10): 12.5 mg via ORAL
  Filled 2022-12-04 (×13): qty 1

## 2022-12-04 MED ORDER — INSULIN PUMP
Freq: Three times a day (TID) | SUBCUTANEOUS | Status: DC
  Filled 2022-12-04: qty 1

## 2022-12-04 MED ORDER — HYDRALAZINE HCL 25 MG PO TABS
25.0000 mg | ORAL_TABLET | Freq: Three times a day (TID) | ORAL | Status: DC
  Administered 2022-12-04 – 2022-12-15 (×29): 25 mg via ORAL
  Filled 2022-12-04 (×31): qty 1

## 2022-12-04 MED ORDER — APIXABAN 2.5 MG PO TABS
2.5000 mg | ORAL_TABLET | Freq: Two times a day (BID) | ORAL | Status: DC
  Administered 2022-12-04 – 2022-12-05 (×3): 2.5 mg via ORAL
  Filled 2022-12-04 (×4): qty 1

## 2022-12-04 MED ORDER — ACETAMINOPHEN 650 MG RE SUPP: 650.0000 mg | Freq: Four times a day (QID) | RECTAL | Status: DC | PRN

## 2022-12-04 MED ORDER — ISOSORBIDE MONONITRATE ER 60 MG PO TB24
60.0000 mg | ORAL_TABLET | Freq: Every day | ORAL | Status: DC
  Administered 2022-12-05 – 2022-12-15 (×9): 60 mg via ORAL
  Filled 2022-12-04 (×11): qty 1

## 2022-12-04 MED ORDER — LORATADINE 10 MG PO TABS
10.0000 mg | ORAL_TABLET | Freq: Every day | ORAL | Status: DC
  Administered 2022-12-05 – 2022-12-18 (×14): 10 mg via ORAL
  Filled 2022-12-04 (×14): qty 1

## 2022-12-04 NOTE — H&P (Addendum)
History and Physical    [name] [name] ZOX:[id number] DOB: 1940/10/08 DOA: 12/04/2022  PCP: Dorothyann Peng, MD   Patient coming from: Home  Chief Complaint: SOB  HPI: [name] [name] is a 82 y.o. female with medical history significant for HFpEF, type 2 diabetes insulin-dependent, CKD stage IV, hypertension, dyslipidemia, chronic anemia of CKD, and prior CVA who presented to the ED with worsening shortness of breath as well as dry cough that began last night.  Her cough has been minimally productive.  She had called EMS and her pulse ox on room air was in the mid 80th percentile and she was noted to have some increased work of breathing.  She was seen in the ED on 8/24 due to a wound on her left shin and was treated with doxycycline and sent home.  She was apparently placed on some Bactrim for left lower extremity wound infection/cellulitis over a week ago and completed a few doses and then began to have some nausea and poor appetite.  This appears to have lasted over the last 1 week or so.  No chest pain, fevers, or chills noted.  Denies any orthopnea or exertional dyspnea.   ED Course: Vital signs are stable and patient afebrile.  She was noted to be quite hypoxemic and is requiring BiPAP mask for support.  Hemoglobin 9.3 and creatinine 10 with BUN of 105.  Chest x-ray with left lower lobe atelectasis and effusion noted.  COVID testing negative.  She was started on Rocephin and doxycycline in the ED.  Review of Systems: Reviewed as noted above, otherwise negative.  Past Medical History:  Diagnosis Date   Acute kidney injury superimposed on chronic kidney disease (HCC) 12/02/2021   Acute respiratory failure with hypoxia (HCC) 09/03/2021   Anemia due to chronic kidney disease 09/03/2021   BPPV (benign paroxysmal positional vertigo), left 01/24/2021   Chronic diastolic CHF (congestive heart failure) (HCC) 09/02/2021   Combined forms of age-related cataract of both eyes 08/24/2016   CVA,  old, hemiparesis (HCC) 02/06/2013   Diabetes mellitus (HCC)    Diabetic macular edema (HCC) 08/22/2012   Diabetic retinopathy associated with type 2 diabetes mellitus (HCC) 09/20/2020   Essential hypertension    Gait abnormality 12/26/2021   H/O: CVA (cerebrovascular accident) 04/10/2000   History of falling 12/26/2021   HTN (hypertension)    Hyperlipidemia    Hypermagnesemia 12/02/2021   Hypertensive heart and renal disease 09/20/2020   Hypertensive urgency 09/02/2021   Hypokalemia 12/02/2021   Left ventricular diastolic dysfunction, NYHA class 1 02/06/2013   NVG (neovascular glaucoma), left, severe stage 07/24/2019   Overweight (BMI 25.0-29.9) 10/09/2018   Poorly controlled type 2 diabetes mellitus with circulatory disorder (HCC)    Prolonged QT interval 12/02/2021   Pulmonary HTN (HCC)    mild by echo   Sensorineural hearing loss (SNHL), bilateral 01/24/2021   Stage 3b chronic kidney disease (HCC) 12/26/2021   Syncope and collapse 12/01/2021    Past Surgical History:  Procedure Laterality Date   ABDOMINAL HYSTERECTOMY     CARDIAC CATHETERIZATION  03/17/2003   Normal, patent coronary arteries. Normal LV systolic function   CARDIOVASCULAR STRESS TEST  08/28/2006   Small sized, moderate ischemiain the Basal Inferoseptal, Basal Inferior, Mid Inferior and Apical Inferior regions   TRANSTHORACIC ECHOCARDIOGRAM  02/17/2010   EF >55%, moderate-severely thickened septum, mild-moderate mitral annular calcification     reports that she has never smoked. She has never used smokeless tobacco. She reports that she does not  drink alcohol and does not use drugs.  Allergies  Allergen Reactions   Hydralazine Itching   Brimonidine Other (See Comments)    Eyes swell and itch   Dorzolamide Other (See Comments)    Other reaction(s): Other (See Comments)  Red, inflamed eyelid, dermatitis   Latex     Blisters/ skin irritation   Timolol Other (See Comments)    Caused eyes to swell and itch     Family History  Problem Relation Age of Onset   Hypertension Mother    Hypertension Father    Heart Problems Father    Hypertension Sister    Diabetes Sister    Hypertension Brother    Diabetes Brother    Breast cancer Neg Hx     Prior to Admission medications   Medication Sig Start Date End Date Taking? Authorizing Provider  acetaminophen (TYLENOL) 500 MG tablet Take 1,000 mg by mouth every 6 (six) hours as needed.   Yes [provider]  amLODipine (NORVASC) 5 MG tablet TAKE ONE TABLET BY MOUTH ONCE DAILY 11/15/22  Yes Lennette Bihari, MD  apixaban (ELIQUIS) 2.5 MG TABS tablet TAKE ONE TABLET BY MOUTH TWICE DAILY 08/18/22  Yes Lennette Bihari, MD  atorvastatin (LIPITOR) 20 MG tablet Take 1 tablet (20 mg total) by mouth daily. 03/31/22 03/31/23 Yes Dorothyann Peng, MD  cetirizine (ZYRTEC ALLERGY) 10 MG tablet Take 1 tablet (10 mg total) by mouth daily. 06/12/22 06/12/23 Yes Dorothyann Peng, MD  cholecalciferol (VITAMIN D3) 25 MCG (1000 UNIT) tablet Take by mouth. 08/12/21  Yes [provider]  doxycycline (VIBRA-TABS) 100 MG tablet Take 1 tablet (100 mg total) by mouth 2 (two) times daily for 5 days. 11/30/22 12/05/22 Yes Ellender Hose, NP  famotidine (PEPCID) 20 MG tablet Take 20 mg by mouth daily. 11/30/22  Yes [provider]  Ferrous Sulfate (IRON PO) Take 65 mg by mouth daily.   Yes [provider]  fluticasone (FLONASE) 50 MCG/ACT nasal spray Place 1 spray into both nostrils daily. 08/12/21  Yes [provider]  furosemide (LASIX) 20 MG tablet Take 1 tablet (20 mg total) by mouth as needed (for swelling once daily). 08/29/22  Yes Lennette Bihari, MD  HUMALOG 100 UNIT/ML injection Inject into the skin as directed. 11/30/22  Yes [provider]  hydrALAZINE (APRESOLINE) 25 MG tablet Take 25 mg by mouth 3 (three) times daily.   Yes [provider]  hydrOXYzine (ATARAX) 25 MG tablet Take 1 tablet (25 mg total) by mouth 3 (three) times  daily as needed for itching. 12/16/21  Yes Dorothyann Peng, MD  Insulin Disposable Pump (OMNIPOD 5 G6 PODS, GEN 5,) MISC Use to administer insulin total daily dose 35 units. Change every 3 days 12/01/22  Yes [provider]  insulin lispro (HUMALOG KWIKPEN) 200 UNIT/ML KwikPen inject 8-10 units into THE SKIN 15 minutes BEFORE THREE main meals of THE DAY Patient taking differently: No sig reported 02/10/22  Yes Carlus Pavlov, MD  isosorbide mononitrate (IMDUR) 30 MG 24 hr tablet Take 60 mg by mouth daily.   Yes [provider]  latanoprost (XALATAN) 0.005 % ophthalmic solution Place 1 drop into both eyes at bedtime.  03/17/19  Yes [provider]  metoprolol succinate (TOPROL-XL) 25 MG 24 hr tablet TAKE 1/2 TABLET BY MOUTH ONCE DAILY 11/15/22  Yes Lennette Bihari, MD  metroNIDAZOLE (METROCREAM) 0.75 % cream Apply 1 Application topically 2 (two) times daily. 11/30/22  Yes [provider]  Multiple  Vitamin (MULTIVITAMIN) tablet Take 1 tablet by mouth daily.   Yes [provider]  OZEMPIC, 1 MG/DOSE, 4 MG/3ML SOPN Inject 1 mg into the skin once a week. 06/20/22  Yes [provider]  POTASSIUM BICARBONATE PO Take by mouth. 01/27/13  Yes [provider]  traMADol (ULTRAM) 50 MG tablet Take 1 tablet (50 mg total) by mouth every 8 (eight) hours as needed for up to 5 days. 11/30/22 12/05/22 Yes Ellender Hose, NP  ACCU-CHEK AVIVA PLUS test strip TEST THREE TIMES DAILY AS DIRECTED 07/20/20   Dorothyann Peng, MD  B-D UF III MINI PEN NEEDLES 31G X 5 MM MISC Use as directed 4x a day 12/13/21   Dorothyann Peng, MD  blood glucose meter kit and supplies Dispense based on patient and insurance preference. Use up to four times daily as directed.dx code e11.65 03/08/21   Dorothyann Peng, MD  Blood Glucose Monitoring Suppl (TRUE METRIX METER) DEVI Use to check blood sugars 3 times a day. 03/08/21   Dorothyann Peng, MD  glucose blood (TRUE METRIX BLOOD GLUCOSE TEST) test  strip Use as instructed to check blood sugar 3 times a day. Dx code e11.65 03/08/21   Dorothyann Peng, MD  Lancets [facility] SOFT [facility]) lancets Check blood sugars 3 times daily E11.65 02/12/18   Dorothyann Peng, MD    Physical Exam: Vitals:   12/04/22 1211 12/04/22 1214 12/04/22 1221 12/04/22 1224  Pulse:  70 70   Resp:   (!) 24   Temp:   97.8 F (36.6 C)   TempSrc:   Axillary   SpO2: (!) 89% 97% 95%   Weight:    64.4 kg  Height:    5\' 2"  (1.575 m)    Constitutional: NAD, calm, comfortable Vitals:   12/04/22 1211 12/04/22 1214 12/04/22 1221 12/04/22 1224  Pulse:  70 70   Resp:   (!) 24   Temp:   97.8 F (36.6 C)   TempSrc:   Axillary   SpO2: (!) 89% 97% 95%   Weight:    64.4 kg  Height:    5\' 2"  (1.575 m)   Eyes: lids and conjunctivae normal Neck: normal, supple Respiratory: clear to auscultation bilaterally. Normal respiratory effort. No accessory muscle use.  Cardiovascular: Regular rate and rhythm, no murmurs. Abdomen: no tenderness, no distention. Bowel sounds positive.  Musculoskeletal:  No edema.  Left lower extremity wound C/D/I Skin: no rashes, lesions, ulcers.  Psychiatric: Flat affect  Labs on Admission: I have personally reviewed following labs and imaging studies  CBC: Recent Labs  Lab 12/04/22 1225  WBC 9.8  HGB 9.3*  HCT 29.1*  MCV 89.5  PLT 460*   Basic Metabolic Panel: Recent Labs  Lab 12/04/22 1225  NA 135  K 4.2  CL 107  CO2 11*  GLUCOSE 150*  BUN 105*  CREATININE 10.03*  CALCIUM 8.7*  MG 3.2*   GFR: Estimated Creatinine Clearance: 3.7 mL/min (A) (by C-G formula based on SCr of 10.03 mg/dL (H)). Liver Function Tests: No results for input(s): "AST", "ALT", "ALKPHOS", "BILITOT", "PROT", "ALBUMIN" in the last 168 hours. No results for input(s): "LIPASE", "AMYLASE" in the last 168 hours. No results for input(s): "AMMONIA" in the last 168 hours. Coagulation Profile: No results for input(s): "INR", "PROTIME" in the last 168  hours. Cardiac Enzymes: No results for input(s): "CKTOTAL", "CKMB", "CKMBINDEX", "TROPONINI" in the last 168 hours. BNP (last 3 results) No results for input(s): "PROBNP" in the last 8760 hours. HbA1C: No results for  input(s): "HGBA1C" in the last 72 hours. CBG: No results for input(s): "GLUCAP" in the last 168 hours. Lipid Profile: No results for input(s): "CHOL", "HDL", "LDLCALC", "TRIG", "CHOLHDL", "LDLDIRECT" in the last 72 hours. Thyroid Function Tests: No results for input(s): "TSH", "T4TOTAL", "FREET4", "T3FREE", "THYROIDAB" in the last 72 hours. Anemia Panel: No results for input(s): "VITAMINB12", "FOLATE", "FERRITIN", "TIBC", "IRON", "RETICCTPCT" in the last 72 hours. Urine analysis:    Component Value Date/Time   COLORURINE STRAW (A) 02/12/2019 1224   APPEARANCEUR CLEAR 02/12/2019 1224   LABSPEC 1.013 02/12/2019 1224   PHURINE 6.0 02/12/2019 1224   GLUCOSEU 150 (A) 02/12/2019 1224   HGBUR NEGATIVE 02/12/2019 1224   BILIRUBINUR Negative 08/17/2022 1652   KETONESUR NEGATIVE 02/12/2019 1224   PROTEINUR Positive (A) 08/17/2022 1652   PROTEINUR 100 (A) 02/12/2019 1224   UROBILINOGEN 0.2 08/17/2022 1652   NITRITE Negative 08/17/2022 1652   NITRITE NEGATIVE 02/12/2019 1224   LEUKOCYTESUR Negative 08/17/2022 1652   LEUKOCYTESUR NEGATIVE 02/12/2019 1224    Radiological Exams on Admission: US Venous Img Lower  Left (DVT Study)  Result Date: 12/04/2022 CLINICAL DATA:  Short of breath, left lower extremity wound EXAM: LEFT LOWER EXTREMITY VENOUS DOPPLER ULTRASOUND TECHNIQUE: Gray-scale sonography with compression, as well as color and duplex ultrasound, were performed to evaluate the deep venous system(s) from the level of the common femoral vein through the popliteal and proximal calf veins. COMPARISON:  None Available. FINDINGS: VENOUS Normal compressibility of the common femoral, superficial femoral, and popliteal veins, as well as the visualized calf veins. Visualized portions  of profunda femoral vein and great saphenous vein unremarkable. No filling defects to suggest DVT on grayscale or color Doppler imaging. Doppler waveforms show normal direction of venous flow, normal respiratory plasticity and response to augmentation. Limited views of the contralateral common femoral vein are unremarkable. OTHER None. Limitations: none IMPRESSION: Negative. Electronically Signed   By: Malachy Moan M.D.   On: 12/04/2022 13:23   DG Chest Portable 1 View  Result Date: 12/04/2022 CLINICAL DATA:  Shortness of breath.  History of CHF. EXAM: PORTABLE CHEST 1 VIEW COMPARISON:  06/02/2022. FINDINGS: There is a left retrocardiac airspace opacity obscuring the left hemidiaphragm, descending thoracic aorta and blunting the left lateral costophrenic angle suggesting combination of left lower lobe atelectasis and/or consolidation with pleural effusion. Bilateral lung fields are otherwise clear. Right lateral costophrenic angle is also clear. Stable cardio-mediastinal silhouette. No acute osseous abnormalities. The soft tissues are within normal limits. IMPRESSION: 1. Left lower lobe atelectasis and/or consolidation with pleural effusion. Electronically Signed   By: Jules Schick M.D.   On: 12/04/2022 13:14   DG Tibia/Fibula Left  Result Date: 12/02/2022 CLINICAL DATA:  Mid lower leg wound for 10 days EXAM: LEFT TIBIA AND FIBULA - 2 VIEW COMPARISON:  None Available. FINDINGS: There is no evidence of fracture or other focal bone lesions. Osteopenia. Vascular calcifications. Mild soft tissue swelling if there is further concern of bone or soft tissue infection, MRI or bone scan could be performed for further sensitivity IMPRESSION: No acute osseous abnormality.  Mild soft tissue swelling Electronically Signed   By: Karen Kays M.D.   On: 12/02/2022 19:35    EKG: Independently reviewed. SR 68bpm.  Assessment/Plan Principal Problem:   Acute hypoxemic respiratory failure (HCC) Active Problems:    Chronic kidney disease (CKD), stage IV (severe) (HCC)   Essential hypertension   Mixed hyperlipidemia   Anemia due to chronic kidney disease   Diabetic retinopathy associated with type  2 diabetes mellitus (HCC)   Wound of left leg    Acute hypoxemic respiratory failure-likely related to possible pneumonia Currently on BiPAP Left lower lobe atelectasis versus effusion with possible pneumonia Continue Rocephin and doxycycline for now and check procalcitonin Scheduled breathing treatments  AKI on CKD stage IV with metabolic acidosis likely prerenal Poor oral intake over the last 1 week noted Continue to monitor strict I's and O's Start gentle IV fluid Start sodium bicarbonate Does not desire hemodialysis Appreciate nephrology evaluation  Left lower extremity cellulitis/wound Obtain wound care Continue current antibiotics with Rocephin and doxycycline DVT evaluation negative  Elevated BNP Does not appear to be related to acute CHF Likely elevated in the setting of AKI on CKD stage IV  Anemia of CKD Receives Retacrit injections outpatient with last dose 8/5 Recent hemoglobin noted to be 10.5  DM2 with mild hyperglycemia SSI every 4 hours while currently n.p.o. and on BiPAP  Hypertension Continue home medications  Dyslipidemia Continue atorvastatin  PAF Continue on Eliquis Continue metoprolol  HFpEF Continue metoprolol Repeat 2D echocardiogram with previous 03/2021 with LVEF 65-70% with grade 1 diastolic dysfunction and moderate LVH Noted to have elevated BNP, but more likely related to significant renal dysfunction   DVT prophylaxis: Eliquis Code Status: Full Family Communication: Daughter at bedside 8/26 Disposition Plan: Admit for evaluation of AKI Consults called: Nephrology Admission status: Inpatient, SDU  Severity of Illness: The appropriate patient status for this patient is INPATIENT. Inpatient status is judged to be reasonable and necessary in order  to provide the required intensity of service to ensure the patient's safety. The patient's presenting symptoms, physical exam findings, and initial radiographic and laboratory data in the context of their chronic comorbidities is felt to place them at high risk for further clinical deterioration. Furthermore, it is not anticipated that the patient will be medically stable for discharge from the hospital within 2 midnights of admission.   * I certify that at the point of admission it is my clinical judgment that the patient will require inpatient hospital care spanning beyond 2 midnights from the point of admission due to high intensity of service, high risk for further deterioration and high frequency of surveillance required.*   Sasha Rogel D Jeston Junkins DO [facility]  If 7PM-7AM, please contact night-coverage [email or web address]  12/04/2022, 2:23 PM

## 2022-12-04 NOTE — ED Provider Notes (Signed)
[place] EMERGENCY DEPARTMENT AT [facility] Provider Note   CSN: [id number] Arrival date & time: 12/04/22  1207     History  Chief Complaint  Patient presents with   Shortness of Breath         [name] [name] is a 82 y.o. female.   Shortness of Breath  82 year old female with past medical history of atrial fibrillation and CHF presenting to the emergency department today with shortness of breath.  The patient began with a dry cough last night.  She is complaining of shortness of breath this morning.  Medics arrived the patient's pulse ox on room air was in the mid 80s.  The patient did have some increased work of breathing.  She was placed on CPAP and brought to the emergency department.  The majority of the history is taken from the patient's daughter.  She was seen here yesterday and was treated with doxycycline for a wound on her left shin.  She has not had any hemoptysis.  Her daughter states that she was coughing up a small amount of yellowish sputum.  She does not have a history of asthma or COPD.    Home Medications Prior to Admission medications   Medication Sig Start Date End Date Taking? Authorizing Provider  acetaminophen (TYLENOL) 500 MG tablet Take 1,000 mg by mouth every 6 (six) hours as needed.   Yes [provider]  amLODipine (NORVASC) 5 MG tablet TAKE ONE TABLET BY MOUTH ONCE DAILY 11/15/22  Yes Lennette Bihari, MD  apixaban (ELIQUIS) 2.5 MG TABS tablet TAKE ONE TABLET BY MOUTH TWICE DAILY 08/18/22  Yes Lennette Bihari, MD  atorvastatin (LIPITOR) 20 MG tablet Take 1 tablet (20 mg total) by mouth daily. 03/31/22 03/31/23 Yes Dorothyann Peng, MD  cetirizine (ZYRTEC ALLERGY) 10 MG tablet Take 1 tablet (10 mg total) by mouth daily. 06/12/22 06/12/23 Yes Dorothyann Peng, MD  cholecalciferol (VITAMIN D3) 25 MCG (1000 UNIT) tablet Take by mouth. 08/12/21  Yes [provider]  doxycycline (VIBRA-TABS) 100 MG tablet Take 1 tablet (100 mg total) by  mouth 2 (two) times daily for 5 days. 11/30/22 12/05/22 Yes Ellender Hose, NP  famotidine (PEPCID) 20 MG tablet Take 20 mg by mouth daily. 11/30/22  Yes [provider]  Ferrous Sulfate (IRON PO) Take 65 mg by mouth daily.   Yes [provider]  fluticasone (FLONASE) 50 MCG/ACT nasal spray Place 1 spray into both nostrils daily. 08/12/21  Yes [provider]  furosemide (LASIX) 20 MG tablet Take 1 tablet (20 mg total) by mouth as needed (for swelling once daily). 08/29/22  Yes Lennette Bihari, MD  HUMALOG 100 UNIT/ML injection Inject into the skin as directed. 11/30/22  Yes [provider]  hydrALAZINE (APRESOLINE) 25 MG tablet Take 25 mg by mouth 3 (three) times daily.   Yes [provider]  hydrOXYzine (ATARAX) 25 MG tablet Take 1 tablet (25 mg total) by mouth 3 (three) times daily as needed for itching. 12/16/21  Yes Dorothyann Peng, MD  Insulin Disposable Pump (OMNIPOD 5 G6 PODS, GEN 5,) MISC Use to administer insulin total daily dose 35 units. Change every 3 days 12/01/22  Yes [provider]  insulin lispro (HUMALOG KWIKPEN) 200 UNIT/ML KwikPen inject 8-10 units into THE SKIN 15 minutes BEFORE THREE main meals of THE DAY Patient taking differently: No sig reported 02/10/22  Yes Carlus Pavlov, MD  isosorbide mononitrate (IMDUR) 30 MG 24 hr tablet Take 60 mg by  mouth daily.   Yes [provider]  latanoprost (XALATAN) 0.005 % ophthalmic solution Place 1 drop into both eyes at bedtime.  03/17/19  Yes [provider]  metoprolol succinate (TOPROL-XL) 25 MG 24 hr tablet TAKE 1/2 TABLET BY MOUTH ONCE DAILY 11/15/22  Yes Lennette Bihari, MD  metroNIDAZOLE (METROCREAM) 0.75 % cream Apply 1 Application topically 2 (two) times daily. 11/30/22  Yes [provider]  Multiple Vitamin (MULTIVITAMIN) tablet Take 1 tablet by mouth daily.   Yes [provider]  OZEMPIC, 1 MG/DOSE, 4 MG/3ML SOPN Inject 1 mg into the skin once a week.  06/20/22  Yes [provider]  POTASSIUM BICARBONATE PO Take by mouth. 01/27/13  Yes [provider]  traMADol (ULTRAM) 50 MG tablet Take 1 tablet (50 mg total) by mouth every 8 (eight) hours as needed for up to 5 days. 11/30/22 12/05/22 Yes Ellender Hose, NP  ACCU-CHEK AVIVA PLUS test strip TEST THREE TIMES DAILY AS DIRECTED 07/20/20   Dorothyann Peng, MD  B-D UF III MINI PEN NEEDLES 31G X 5 MM MISC Use as directed 4x a day 12/13/21   Dorothyann Peng, MD  blood glucose meter kit and supplies Dispense based on patient and insurance preference. Use up to four times daily as directed.dx code e11.65 03/08/21   Dorothyann Peng, MD  Blood Glucose Monitoring Suppl (TRUE METRIX METER) DEVI Use to check blood sugars 3 times a day. 03/08/21   Dorothyann Peng, MD  glucose blood (TRUE METRIX BLOOD GLUCOSE TEST) test strip Use as instructed to check blood sugar 3 times a day. Dx code e11.65 03/08/21   Dorothyann Peng, MD  Lancets (ACCU-CHEK SOFT [facility]) lancets Check blood sugars 3 times daily E11.65 02/12/18   Dorothyann Peng, MD      Allergies    Hydralazine, Brimonidine, Dorzolamide, Latex, and Timolol    Review of Systems   Review of Systems  Reason unable to perform ROS: ROS unobtainable as patient is on BiPAP, history taken from daughter.  Respiratory:  Positive for shortness of breath.     Physical Exam Updated Vital Signs Pulse 70   Temp 97.8 F (36.6 C) (Axillary)   Resp (!) 24   Ht 5\' 2"  (1.575 m)   Wt 64.4 kg   SpO2 95%   BMI 25.97 kg/m  Physical Exam Vitals and nursing note reviewed.   Gen: Increased work of breathing noted Eyes: PERRL, EOMI HEENT: no oropharyngeal swelling Neck: trachea midline Resp: Diminished at bilateral lung bases Card: RRR, no murmurs, rubs, or gallops Abd: nontender, nondistended Extremities: 1+ pitting edema to the left lower extremity, there is a superficial abrasion/scab over the anterior shin with some mild surrounding erythema, there is trace  pitting edema of the right lower extremity, no calf tenderness Vascular: 2+ radial pulses bilaterally, 2+ DP pulses bilaterally Neuro: Alert, no focal deficits Skin: no rashes Psyc: acting appropriately   ED Results / Procedures / Treatments   Labs (all labs ordered are listed, but only abnormal results are displayed) Labs Reviewed  BASIC METABOLIC PANEL - Abnormal; Notable for the following components:      Result Value   CO2 11 (*)    Glucose, Bld 150 (*)    BUN 105 (*)    Creatinine, Ser 10.03 (*)    Calcium 8.7 (*)    GFR, Estimated 3 (*)    Anion gap 17 (*)    All other components within normal limits  MAGNESIUM - Abnormal; Notable for the  following components:   Magnesium 3.2 (*)    All other components within normal limits  BRAIN NATRIURETIC PEPTIDE - Abnormal; Notable for the following components:   B Natriuretic Peptide >4,500.0 (*)    All other components within normal limits  CBC - Abnormal; Notable for the following components:   RBC 3.25 (*)    Hemoglobin 9.3 (*)    HCT 29.1 (*)    Platelets 460 (*)    All other components within normal limits  BLOOD GAS, VENOUS - Abnormal; Notable for the following components:   pCO2, Ven 24 (*)    Bicarbonate 12.4 (*)    Acid-base deficit 11.8 (*)    All other components within normal limits  RESP PANEL BY RT-PCR (RSV, FLU A&B, COVID)  RVPGX2    EKG None  Radiology US Venous Img Lower  Left (DVT Study)  Result Date: 12/04/2022 CLINICAL DATA:  Short of breath, left lower extremity wound EXAM: LEFT LOWER EXTREMITY VENOUS DOPPLER ULTRASOUND TECHNIQUE: Gray-scale sonography with compression, as well as color and duplex ultrasound, were performed to evaluate the deep venous system(s) from the level of the common femoral vein through the popliteal and proximal calf veins. COMPARISON:  None Available. FINDINGS: VENOUS Normal compressibility of the common femoral, superficial femoral, and popliteal veins, as well as the  visualized calf veins. Visualized portions of profunda femoral vein and great saphenous vein unremarkable. No filling defects to suggest DVT on grayscale or color Doppler imaging. Doppler waveforms show normal direction of venous flow, normal respiratory plasticity and response to augmentation. Limited views of the contralateral common femoral vein are unremarkable. OTHER None. Limitations: none IMPRESSION: Negative. Electronically Signed   By: Malachy Moan M.D.   On: 12/04/2022 13:23   DG Chest Portable 1 View  Result Date: 12/04/2022 CLINICAL DATA:  Shortness of breath.  History of CHF. EXAM: PORTABLE CHEST 1 VIEW COMPARISON:  06/02/2022. FINDINGS: There is a left retrocardiac airspace opacity obscuring the left hemidiaphragm, descending thoracic aorta and blunting the left lateral costophrenic angle suggesting combination of left lower lobe atelectasis and/or consolidation with pleural effusion. Bilateral lung fields are otherwise clear. Right lateral costophrenic angle is also clear. Stable cardio-mediastinal silhouette. No acute osseous abnormalities. The soft tissues are within normal limits. IMPRESSION: 1. Left lower lobe atelectasis and/or consolidation with pleural effusion. Electronically Signed   By: Jules Schick M.D.   On: 12/04/2022 13:14   DG Tibia/Fibula Left  Result Date: 12/02/2022 CLINICAL DATA:  Mid lower leg wound for 10 days EXAM: LEFT TIBIA AND FIBULA - 2 VIEW COMPARISON:  None Available. FINDINGS: There is no evidence of fracture or other focal bone lesions. Osteopenia. Vascular calcifications. Mild soft tissue swelling if there is further concern of bone or soft tissue infection, MRI or bone scan could be performed for further sensitivity IMPRESSION: No acute osseous abnormality.  Mild soft tissue swelling Electronically Signed   By: Karen Kays M.D.   On: 12/02/2022 19:35    Procedures Procedures    Medications Ordered in ED Medications  cefTRIAXone (ROCEPHIN) 1 g in  sodium chloride 0.9 % 100 mL IVPB (has no administration in time range)  doxycycline (VIBRA-TABS) tablet 100 mg (has no administration in time range)    ED Course/ Medical Decision Making/ A&P                                 Medical Decision Making 82 year old female with  past medical history of atrial fibrillation and CHF presenting to the emergency department today with hypoxia and shortness of breath.  Patient does appear to be slightly volume overloaded here on exam.  She is resting much more comfortably on BiPAP which she was placed on a soon as she arrived here to the emergency department.  I will further evaluate the patient here with basic labs well as an EKG, chest x-ray, troponin, and BNP for further evaluation for CHF, pulmonary edema, pulmonary infiltrates, or pneumothorax.  Also obtain ultrasound of her left lower extremity.  She does have a slightly productive cough with this as well.  Will obtain RSV/COVID/flu swab on the patient.  If her chest x-ray is unremarkable we will consider further imaging with a CT angiogram to evaluate for pulmonary embolism or alternative causes.  The patient does seem to be relatively stable on BiPAP now.  I will monitor the patient carefully.  The patient's EKG interpreted by me shows a sinus rhythm with left bundle branch block with a rate 68 that is negative by Sgarbossa criteria.  The patient's chest x-ray does show a left lower lobe infiltrate versus atelectasis.  Given her complaint of a cough that is minimally productive she is given Rocephin and doxycycline for community-acquired pneumonia as her QTc is 494 so azithromycin is avoided.  The patient's creatinine is elevated compared to her baseline.  Her potassium is within normal limits.  DVT study is negative.  He also placed the hospital service for admission.  Critical care time 35 minutes including reassessments, review of old records, coordination of care with hospitalist service, treatment of  hypoxia and increased work of breathing with BiPAP  Amount and/or Complexity of Data Reviewed Labs: ordered. Radiology: ordered.  Risk Prescription drug management. Decision regarding hospitalization.          Final Clinical Impression(s) / ED Diagnoses Final diagnoses:  Community acquired pneumonia of left lower lobe of lung  Hypoxia  AKI (acute kidney injury) (HCC)  Disposition: admit  Rx / DC Orders ED Discharge Orders     None         Durwin Glaze, MD 12/04/22 1406

## 2022-12-04 NOTE — Progress Notes (Signed)
Patient currently on 2lpm [place] and saturations are in 90s. RT has BIPAP on standby if and when patient needs has a need for the unit.

## 2022-12-04 NOTE — ED Triage Notes (Signed)
Pt brought in by [facility] for SOB, states pt is diminished bilaterally. Pt was 88-89% room air and after being placed on c-pap went to 100%. Pt has hx of CHF and takes PRN lasix when swelling noticed. Per EMS pt was seen on Saturday for a leg wound and it is now "blistery and redness has spread."

## 2022-12-05 ENCOUNTER — Encounter (HOSPITAL_COMMUNITY): Payer: Self-pay | Admitting: Internal Medicine

## 2022-12-05 ENCOUNTER — Inpatient Hospital Stay (HOSPITAL_COMMUNITY): Payer: Medicare Other

## 2022-12-05 ENCOUNTER — Other Ambulatory Visit: Payer: Self-pay | Admitting: Internal Medicine

## 2022-12-05 DIAGNOSIS — Z515 Encounter for palliative care: Secondary | ICD-10-CM

## 2022-12-05 DIAGNOSIS — N184 Chronic kidney disease, stage 4 (severe): Secondary | ICD-10-CM | POA: Diagnosis not present

## 2022-12-05 DIAGNOSIS — Z7189 Other specified counseling: Secondary | ICD-10-CM

## 2022-12-05 DIAGNOSIS — I5031 Acute diastolic (congestive) heart failure: Secondary | ICD-10-CM | POA: Diagnosis not present

## 2022-12-05 DIAGNOSIS — J9601 Acute respiratory failure with hypoxia: Secondary | ICD-10-CM | POA: Diagnosis not present

## 2022-12-05 DIAGNOSIS — E1165 Type 2 diabetes mellitus with hyperglycemia: Secondary | ICD-10-CM

## 2022-12-05 LAB — GLUCOSE, CAPILLARY
Glucose-Capillary: 112 mg/dL — ABNORMAL HIGH (ref 70–99)
Glucose-Capillary: 119 mg/dL — ABNORMAL HIGH (ref 70–99)
Glucose-Capillary: 170 mg/dL — ABNORMAL HIGH (ref 70–99)
Glucose-Capillary: 98 mg/dL (ref 70–99)
Glucose-Capillary: 96 mg/dL (ref 70–99)

## 2022-12-05 LAB — ECHOCARDIOGRAM COMPLETE
AR max vel: 1.42 cm2
AV Area VTI: 1.41 cm2
AV Area mean vel: 1.51 cm2
AV Mean grad: 5.7 mmHg
AV Peak grad: 12.3 mmHg
Ao pk vel: 1.76 m/s
Area-P 1/2: 2.96 cm2
Calc EF: 47.9 %
Est EF: 35
Height: 62 in
MV M vel: 5.19 m/s
MV Peak grad: 107.5 mmHg
S' Lateral: 3.6 cm
Single Plane A2C EF: 43.8 %
Single Plane A4C EF: 51.7 %
Weight: 2271.97 oz

## 2022-12-05 LAB — URINALYSIS, ROUTINE W REFLEX MICROSCOPIC
Bilirubin Urine: NEGATIVE
Glucose, UA: 50 mg/dL — AB
Ketones, ur: NEGATIVE mg/dL
Leukocytes,Ua: NEGATIVE
Nitrite: NEGATIVE
Protein, ur: >=300 mg/dL — AB
Specific Gravity, Urine: 1.01 (ref 1.005–1.030)
pH: 5 (ref 5.0–8.0)

## 2022-12-05 LAB — CBC
HCT: 26.1 % — ABNORMAL LOW (ref 36.0–46.0)
Hemoglobin: 8.6 g/dL — ABNORMAL LOW (ref 12.0–15.0)
MCH: 29.4 pg (ref 26.0–34.0)
MCHC: 33 g/dL (ref 30.0–36.0)
MCV: 89.1 fL (ref 80.0–100.0)
Platelets: 419 10*3/uL — ABNORMAL HIGH (ref 150–400)
RBC: 2.93 MIL/uL — ABNORMAL LOW (ref 3.87–5.11)
RDW: 15.8 % — ABNORMAL HIGH (ref 11.5–15.5)
WBC: 10.1 10*3/uL (ref 4.0–10.5)
nRBC: 0 % (ref 0.0–0.2)

## 2022-12-05 LAB — BASIC METABOLIC PANEL
Anion gap: 15 (ref 5–15)
BUN: 97 mg/dL — ABNORMAL HIGH (ref 8–23)
CO2: 12 mmol/L — ABNORMAL LOW (ref 22–32)
Calcium: 8.7 mg/dL — ABNORMAL LOW (ref 8.9–10.3)
Chloride: 108 mmol/L (ref 98–111)
Creatinine, Ser: 9.97 mg/dL — ABNORMAL HIGH (ref 0.44–1.00)
GFR, Estimated: 4 mL/min — ABNORMAL LOW (ref >60)
Glucose, Bld: 106 mg/dL — ABNORMAL HIGH (ref 70–99)
Potassium: 4 mmol/L (ref 3.5–5.1)
Sodium: 135 mmol/L (ref 135–145)

## 2022-12-05 LAB — PROCALCITONIN: Procalcitonin: 0.35 ng/mL

## 2022-12-05 LAB — MAGNESIUM: Magnesium: 3 mg/dL — ABNORMAL HIGH (ref 1.7–2.4)

## 2022-12-05 MED ORDER — SODIUM BICARBONATE 8.4 % IV SOLN
INTRAVENOUS | Status: AC
  Administered 2022-12-07: 50 mL via INTRAVENOUS
  Filled 2022-12-05 (×9): qty 1000

## 2022-12-05 MED ORDER — SODIUM BICARBONATE 650 MG PO TABS
650.0000 mg | ORAL_TABLET | Freq: Two times a day (BID) | ORAL | Status: DC
  Filled 2022-12-05: qty 1

## 2022-12-05 MED ORDER — DARBEPOETIN ALFA 60 MCG/0.3ML IJ SOSY
60.0000 ug | PREFILLED_SYRINGE | Freq: Once | INTRAMUSCULAR | Status: AC
  Administered 2022-12-05: 60 ug via SUBCUTANEOUS
  Filled 2022-12-05: qty 0.3

## 2022-12-05 NOTE — Progress Notes (Signed)
Pt eating, asked to attempt later.

## 2022-12-05 NOTE — Consult Note (Addendum)
Nephrology Consult   Assessment/Recommendations:   AKI on CKD5 -followed by Dr. Zetta Bills & Dr. Arrie Aran OP. Has discussed with her nephrologist in regards to not pursuing RRT. Last labs in the office was on 8/5 which revealed a Cr of 7.5. Cr 10 on presentation, stable at 9.97 today -suspecting AKI is prerenal in nature given poor appetite and nausea confounded by the use of bactrim -will start bicarb based fluids 75cc/hr, seems euvolemic on exam -will check urine sediment and renal u/s for completion's sake -will consult palliative care. Patient does not want dialysis (daughter confirms this) however family wants her to pursue dialysis. Discussed establishing goals of care and adv directive, daughter agreeable to pall care consult -Avoid nephrotoxic medications including NSAIDs and iodinated intravenous contrast exposure unless the latter is absolutely indicated.  Preferred narcotic agents for pain control are hydromorphone, fentanyl, and methadone. Morphine should not be used. Avoid Baclofen and avoid oral sodium phosphate and magnesium citrate based laxatives / bowel preps. Continue strict Input and Output monitoring. Will monitor the patient closely with you and intervene or adjust therapy as indicated by changes in clinical status/labs   AHRF Pneumonia -per primary  LLE wound/cellulitis -abx per primary service  Anemia of CKD -last OP ESA dose was on 8/5 -transfuse PRN for hgb <7 -hgb 8.6 -will give a dose of Aranesp today  HTN -if needed, can increase amlodipine to 10mg  daily and/or increase hydralazine  Metabolic acidosis -secondary to AKI/CKD5 -on PO bicarb, switch to bicarb gtt as above  HFpEF -BNP elevated but likely related to significant kidney disease -seems to be euvolemic on exam -echo pending  DM2 -per primary service  Recommendations conveyed to primary service. Discussed with eldest [name] at the bedside.  Addendum: informed by primary  service that patient/family now want dialysis. Will see how she does with bicarb gtt and thoracentesis which is now planned. Awaiting palliative care consultation. Will tentatively make her NPO after midnight.  Anthony Sar [facility] 12/05/2022 7:48 AM   _____________________________________________________________________________________   History of Present Illness: [name] [name] is a/an 82 y.o. female with a past medical history of CKD5 (followed by Dr. Zetta Bills and Dr. Arrie Aran), HFpEF, DM2, hypertension, anemia of CKD, history of CVA, hyperlipidemia who presents to [facility] with shortness of breath and cough.  Cough has been minimally productive.  Upon EMS arrival, her pulse ox on room air was in the mid 80th percentile and had some increased work of breathing.  She was seen recently in the ER on 8/24 for wound on her left shin was treated with doxycycline.  Prior to that, she was placed on Bactrim but only had a few doses but began to have some nausea and poor appetite.  She was found to have a creatinine of 10 with a BUN of 105 upon arrival.  She did require BiPAP given hypoxemia.  Chest x-ray (personally reviewed by me) revealed left lower lobe atelectasis/consolidation with pleural effusion.  She was also found to have an elevated BNP which is thought to be secondary to severe renal dysfunction.  She has been placed on Rocephin and doxycycline. Patient seen and examined bedside this AM. Eldest [name] at the bedside. Majority of history obtained from daughter. Daughter reports that patient's decline started after hurting her leg a couple of weeks ago which necessitated bactrim and then doxycycline. Since being on antibiotics, daughter reports that patient's appetite has declined and had nausea (one episode of vomiting). Patient reports that  her breathing seems to be slightly better. Patient has preexisting wishes not to do dialysis which [name] [name] confirms however [name] [name] does  report that the family wishes for her to be on dialysis. ROS as below.  Medications:  Current Facility-Administered Medications  Medication Dose Route Frequency Provider Last Rate Last Admin   acetaminophen (TYLENOL) tablet 650 mg  650 mg Oral Q6H PRN Maurilio Lovely D, DO   650 mg at 12/04/22 2013   Or   acetaminophen (TYLENOL) suppository 650 mg  650 mg Rectal Q6H PRN Sherryll Burger, Pratik D, DO       amLODipine (NORVASC) tablet 5 mg  5 mg Oral Daily Shah, Pratik D, DO       apixaban Everlene Balls) tablet 2.5 mg  2.5 mg Oral BID Sherryll Burger, Pratik D, DO   2.5 mg at 12/04/22 2146   atorvastatin (LIPITOR) tablet 20 mg  20 mg Oral Daily Sherryll Burger, Pratik D, DO       budesonide (PULMICORT) nebulizer solution 0.25 mg  0.25 mg Nebulization BID Sherryll Burger, Pratik D, DO   0.25 mg at 12/05/22 1610   cefTRIAXone (ROCEPHIN) 1 g in sodium chloride 0.9 % 100 mL IVPB  1 g Intravenous Q24H Sherryll Burger, Pratik D, DO       doxycycline (VIBRAMYCIN) 100 mg in sodium chloride 0.9 % 250 mL IVPB  100 mg Intravenous Q12H Shah, Pratik D, DO       famotidine (PEPCID) tablet 20 mg  20 mg Oral QHS Shah, Pratik D, DO   20 mg at 12/04/22 2146   fluticasone (FLONASE) 50 MCG/ACT nasal spray 1 spray  1 spray Each Nare Daily Sherryll Burger, Pratik D, DO       guaiFENesin-dextromethorphan (ROBITUSSIN DM) 100-10 MG/5ML syrup 5 mL  5 mL Oral Q4H PRN Sherryll Burger, Pratik D, DO   5 mL at 12/04/22 2146   hydrALAZINE (APRESOLINE) injection 10 mg  10 mg Intravenous Q4H PRN Sherryll Burger, Pratik D, DO       hydrALAZINE (APRESOLINE) tablet 25 mg  25 mg Oral TID Sherryll Burger, Pratik D, DO   25 mg at 12/04/22 2146   hydrOXYzine (ATARAX) tablet 10 mg  10 mg Oral TID PRN Frankey Shown, DO   10 mg at 12/04/22 2146   insulin pump   Subcutaneous TID WC, HS, 0200 Sherryll Burger, Pratik D, DO       ipratropium-albuterol (DUONEB) 0.5-2.5 (3) MG/3ML nebulizer solution 3 mL  3 mL Nebulization Q6H PRN Sherryll Burger, Pratik D, DO       isosorbide mononitrate (IMDUR) 24 hr tablet 60 mg  60 mg Oral Daily Shah, Pratik D, DO        latanoprost (XALATAN) 0.005 % ophthalmic solution 1 drop  1 drop Both Eyes QHS Sherryll Burger, Pratik D, DO   1 drop at 12/04/22 2146   loratadine (CLARITIN) tablet 10 mg  10 mg Oral Daily Sherryll Burger, Pratik D, DO       metoprolol succinate (TOPROL-XL) 24 hr tablet 12.5 mg  12.5 mg Oral Daily Shah, Pratik D, DO       sodium bicarbonate tablet 650 mg  650 mg Oral BID Sherryll Burger, Pratik D, DO         ALLERGIES Hydralazine, Brimonidine, Dorzolamide, Latex, and Timolol  MEDICAL HISTORY Past Medical History:  Diagnosis Date   Acute kidney injury superimposed on chronic kidney disease (HCC) 12/02/2021   Acute respiratory failure with hypoxia (HCC) 09/03/2021   Anemia due to chronic kidney disease 09/03/2021   BPPV (benign paroxysmal positional vertigo), left 01/24/2021  Chronic diastolic CHF (congestive heart failure) (HCC) 09/02/2021   Combined forms of age-related cataract of both eyes 08/24/2016   CVA, old, hemiparesis (HCC) 02/06/2013   Diabetes mellitus (HCC)    Diabetic macular edema (HCC) 08/22/2012   Diabetic retinopathy associated with type 2 diabetes mellitus (HCC) 09/20/2020   Essential hypertension    Gait abnormality 12/26/2021   H/O: CVA (cerebrovascular accident) 04/10/2000   History of falling 12/26/2021   HTN (hypertension)    Hyperlipidemia    Hypermagnesemia 12/02/2021   Hypertensive heart and renal disease 09/20/2020   Hypertensive urgency 09/02/2021   Hypokalemia 12/02/2021   Left ventricular diastolic dysfunction, NYHA class 1 02/06/2013   NVG (neovascular glaucoma), left, severe stage 07/24/2019   Overweight (BMI 25.0-29.9) 10/09/2018   Poorly controlled type 2 diabetes mellitus with circulatory disorder (HCC)    Prolonged QT interval 12/02/2021   Pulmonary HTN (HCC)    mild by echo   Sensorineural hearing loss (SNHL), bilateral 01/24/2021   Stage 3b chronic kidney disease (HCC) 12/26/2021   Syncope and collapse 12/01/2021     SOCIAL HISTORY Social History   Socioeconomic  History   Marital status: Married    Spouse name: Not on file   Number of children: Not on file   Years of education: Not on file   Highest education level: Not on file  Occupational History   Occupation: retired  Tobacco Use   Smoking status: Never   Smokeless tobacco: Never  Vaping Use   Vaping status: Never Used  Substance and Sexual Activity   Alcohol use: No   Drug use: Never   Sexual activity: Not Currently  Other Topics Concern   Not on file  Social History Narrative   Not on file   Social Determinants of Health   Financial Resource Strain: Low Risk  (10/19/2021)   Overall Financial Resource Strain (CARDIA)    Difficulty of Paying Living Expenses: Not hard at all  Food Insecurity: No Food Insecurity (12/04/2022)   Hunger Vital Sign    Worried About Running Out of Food in the Last Year: Never true    Ran Out of Food in the Last Year: Never true  Transportation Needs: No Transportation Needs (12/04/2022)   PRAPARE - Administrator, Civil Service (Medical): No    Lack of Transportation (Non-Medical): No  Physical Activity: Inactive (10/19/2021)   Exercise Vital Sign    Days of Exercise per Week: 0 days    Minutes of Exercise per Session: 0 min  Stress: No Stress Concern Present (10/19/2021)   [employer] of Occupational Health - Occupational Stress Questionnaire    Feeling of Stress : Not at all  Social Connections: Not on file  Intimate Partner Violence: Not At Risk (12/04/2022)   Humiliation, Afraid, Rape, and Kick questionnaire    Fear of Current or Ex-Partner: No    Emotionally Abused: No    Physically Abused: No    Sexually Abused: No     FAMILY HISTORY Family History  Problem Relation Age of Onset   Hypertension Mother    Hypertension Father    Heart Problems Father    Hypertension Sister    Diabetes Sister    Hypertension Brother    Diabetes Brother    Breast cancer Neg Hx      Review of Systems: 12 systems reviewed Otherwise as  per HPI, all other systems reviewed and negative  Physical Exam: Vitals:   12/05/22 0347 12/05/22 0724  BP: Marland Kitchen)  174/59   Pulse: 68   Resp: 20   Temp: 98.4 F (36.9 C)   SpO2: 94% 93%   No intake/output data recorded.  Intake/Output Summary (Last 24 hours) at 12/05/2022 0748 Last data filed at 12/05/2022 0600 Gross per 24 hour  Intake 558.64 ml  Output --  Net 558.64 ml   General: ill-appearing HEENT: anicteric sclera, oropharynx clear without lesions CV: regular rate, normal rhythm, no murmurs, no gallops, no rubs Lungs: decreased breath sounds bibasilar, slightly inc'd WOB, no w/r/r/c appreciated bilaterally Abd: soft, non-tender, non-distended Psych: alert, engaged, appropriate mood and affect Musculoskeletal: no sig edema b/l Les, dressing on LLE in place Neuro: normal speech, no gross focal deficits   Test Results Reviewed Lab Results  Component Value Date   NA 135 12/05/2022   K 4.0 12/05/2022   CL 108 12/05/2022   CO2 12 (L) 12/05/2022   BUN 97 (H) 12/05/2022   CREATININE 9.97 (H) 12/05/2022   GLU 302 06/26/2022   CALCIUM 8.7 (L) 12/05/2022   ALBUMIN 2.7 (L) 09/08/2022   PHOS 4.7 (H) 09/08/2022     I have reviewed all relevant outside healthcare records related to the patient's kidney injury.

## 2022-12-05 NOTE — Progress Notes (Signed)
  Echocardiogram 2D Echocardiogram has been performed.  Maren Reamer 12/05/2022, 3:14 PM

## 2022-12-05 NOTE — TOC Initial Note (Signed)
Transition of Care [facility]) - Initial/Assessment Note    Patient Details  Name: [name] [name] MRN: [id number] Date of Birth: 12/10/1940  Transition of Care [facility]) CM/SW Contact:    Elliot Gault, LCSW Phone Number: 12/05/2022, 3:37 PM  Clinical Narrative:                  Pt from home with high risk for readmission score. Pt lives with family who assist as needed. Pt not fully oriented at this time.  It appears family electing to pursue HD trial to see if this will help pt improve. TOC will follow for possible outpatient HD and SNF needs when pt more stable.   Expected Discharge Plan: Skilled Nursing Facility Barriers to Discharge: Continued Medical Work up   Patient Goals and CMS Choice            Expected Discharge Plan and Services In-house Referral: Clinical Social Work     Living arrangements for the past 2 months: Single Family Home                                      Prior Living Arrangements/Services Living arrangements for the past 2 months: Single Family Home Lives with:: Adult Children Patient language and need for interpreter reviewed:: Yes Do you feel safe going back to the place where you live?: Yes      Need for Family Participation in Patient Care: Yes (Comment) Care giver support system in place?: Yes (comment)   Criminal Activity/Legal Involvement Pertinent to Current Situation/Hospitalization: No - Comment as needed  Activities of Daily Living Home Assistive Devices/Equipment: CBG Meter, Walker (specify type), Shower chair with back, Eyeglasses, Dentures (specify type), Grab bars around toilet ADL Screening (condition at time of admission) Patient's cognitive ability adequate to safely complete daily activities?: Yes Is the patient deaf or have difficulty hearing?: No Does the patient have difficulty seeing, even when wearing glasses/contacts?: No Does the patient have difficulty concentrating, remembering, or making decisions?:  Yes Patient able to express need for assistance with ADLs?: Yes Does the patient have difficulty dressing or bathing?: Yes Independently performs ADLs?: No Communication: Independent Dressing (OT): Needs assistance Is this a change from baseline?: Change from baseline, expected to last <3days Grooming: Independent Feeding: Independent Bathing: Needs assistance Is this a change from baseline?: Pre-admission baseline Toileting: Needs assistance Is this a change from baseline?: Change from baseline, expected to last <3 days In/Out Bed: Needs assistance Is this a change from baseline?: Change from baseline, expected to last <3 days Walks in Home: Independent with device (comment) Does the patient have difficulty walking or climbing stairs?: Yes Weakness of Legs: Both Weakness of Arms/Hands: Both  Permission Sought/Granted                  Emotional Assessment       Orientation: : Oriented to Self Alcohol / Substance Use: Not Applicable Psych Involvement: No (comment)  Admission diagnosis:  Hypoxia [R09.02] AKI (acute kidney injury) (HCC) [N17.9] Acute hypoxemic respiratory failure (HCC) [J96.01] Community acquired pneumonia of left lower lobe of lung [J18.9] Patient Active Problem List   Diagnosis Date Noted   Acute hypoxemic respiratory failure (HCC) 12/04/2022   Wound of left leg 11/30/2022   Stage 3b chronic kidney disease (HCC) 12/26/2021   Gait abnormality 12/26/2021   Hypoglycemia 12/26/2021   History of falling 12/26/2021   Aspirin long-term use 12/26/2021  Hypermagnesemia 12/02/2021   Hypokalemia 12/02/2021   Acute kidney injury superimposed on chronic kidney disease (HCC) 12/02/2021   Prolonged QT interval 12/02/2021   Elevated troponin 12/02/2021   Syncope and collapse 12/01/2021   Anemia due to chronic kidney disease 09/03/2021   Acute respiratory failure with hypoxia (HCC) 09/03/2021   Chronic diastolic CHF (congestive heart failure) (HCC) 09/02/2021    Hypertensive urgency 09/02/2021   Chronic kidney disease (CKD), stage IV (severe) (HCC) 09/02/2021   Vertigo 02/27/2021   Wheezing 02/27/2021   BPPV (benign paroxysmal positional vertigo), left 01/24/2021   Dysfunction of right eustachian tube 01/24/2021   Sensorineural hearing loss (SNHL), bilateral 01/24/2021   Diabetic polyneuropathy associated with type 2 diabetes mellitus (HCC) 09/20/2020   Hypertensive heart and renal disease 09/20/2020   Right hand paresthesia 09/20/2020   Diabetic retinopathy associated with type 2 diabetes mellitus (HCC) 09/20/2020   Ptosis of eyelid, left 09/20/2020   Class 1 obesity due to excess calories with serious comorbidity and body mass index (BMI) of 30.0 to 30.9 in adult 09/20/2020   NVG (neovascular glaucoma), left, severe stage 07/24/2019   Mixed hyperlipidemia 10/09/2018   Overweight (BMI 25.0-29.9) 10/09/2018   Central retinal vein occlusion with macular edema of left eye 05/24/2017   Blindness, one eye, unspecified eye 08/24/2016   Combined forms of age-related cataract of both eyes 08/24/2016   Left ventricular diastolic dysfunction, NYHA class 1 02/06/2013   Essential hypertension    Poorly controlled type 2 diabetes mellitus with circulatory disorder (HCC)    Diabetic macular edema (HCC) 08/22/2012   Nuclear cataract of left eye, nonsenile 08/22/2012   Moderate nonproliferative diabetic retinopathy of right eye with macular edema associated with type 2 diabetes mellitus (HCC) 08/22/2012   Primary open angle glaucoma of both eyes, severe stage 08/07/2011   Nuclear sclerosis 08/07/2011   PCP:  Dorothyann Peng, MD Pharmacy:   [facility] - [place], [place] - [address] [place] [address] Phone: [phone] Fax: [phone]  [id number] - [place], [place] - [address] AT [facility] [address] [place] [address] Phone: [phone] Fax:  [phone]  [id number] - [place],  - [address] AT [facility] [address]  [place] [address] Phone: [phone] Fax: [phone]     Social Determinants of Health (SDOH) Social History: SDOH Screenings   Food Insecurity: No Food Insecurity (12/04/2022)  Housing: Low Risk  (12/04/2022)  Transportation Needs: No Transportation Needs (12/04/2022)  Utilities: Not At Risk (12/04/2022)  Depression (PHQ2-9): Low Risk  (08/17/2022)  Financial Resource Strain: Low Risk  (10/19/2021)  Physical Activity: Inactive (10/19/2021)  Stress: No Stress Concern Present (10/19/2021)  Tobacco Use: Low Risk  (12/05/2022)   SDOH Interventions:     Readmission Risk Interventions    12/05/2022    3:37 PM  Readmission Risk Prevention Plan  Transportation Screening Complete  HRI or Home Care Consult Complete  Social Work Consult for Recovery Care Planning/Counseling Complete  Palliative Care Screening Complete  Medication Review Oceanographer) Complete

## 2022-12-05 NOTE — Plan of Care (Signed)

## 2022-12-05 NOTE — Progress Notes (Signed)
PROGRESS NOTE    TALEN DERICO  JYN:[id number] DOB: [birth year] DOA: 12/04/2022 PCP: Dorothyann Peng, MD   Brief Narrative:    [name] [name] is a 82 y.o. female with medical history significant for HFpEF, type 2 diabetes insulin-dependent, CKD stage IV, hypertension, dyslipidemia, chronic anemia of CKD, and prior CVA who presented to the ED with worsening shortness of breath as well as dry cough that began last night.  Patient was admitted with acute hypoxemic respiratory failure-multifactorial in the setting of volume overload as well as suspected pneumonia.  She is also noted to have worsening AKI on CKD stage IV and nephrology recommending hemodialysis, family now appears agreeable.  She is noted to have pleural effusion and will undergo thoracentesis today.  Anticipate hemodialysis initiation in the next 1-2 days.  Assessment & Plan:   Principal Problem:   Acute hypoxemic respiratory failure (HCC) Active Problems:   Chronic kidney disease (CKD), stage IV (severe) (HCC)   Essential hypertension   Mixed hyperlipidemia   Anemia due to chronic kidney disease   Diabetic retinopathy associated with type 2 diabetes mellitus (HCC)   Wound of left leg  Assessment and Plan:  Acute hypoxemic respiratory failure-likely related to possible pneumonia Currently on BiPAP Left lower lobe atelectasis versus effusion with possible pneumonia, plan for thoracentesis today Continue Rocephin and doxycycline for now and check procalcitonin Scheduled breathing treatments   AKI on CKD stage V with metabolic acidosis Poor oral intake over the last 1 week noted Appreciate nephrology recommendations with plans for bicarb infusion through today Family now appears agreeable for hemodialysis and this will be initiated per nephrology when appropriate   Left lower extremity cellulitis/wound Wound care ordered Continue current antibiotics with Rocephin and doxycycline DVT evaluation negative    Elevated BNP Does not appear to be related to acute CHF Likely elevated in the setting of AKI on CKD stage IV   Anemia of CKD Receives Retacrit injections outpatient with last dose 8/5 Recent hemoglobin noted to be 10.5 Continue to monitor with repeat CBC, no overt bleeding noted Transfuse for hemoglobin less than 7 Dose of Aranesp 60 mcg per nephrology   DM2 with mild hyperglycemia Started on carb modified diet Insulin pump order set utilized for management   Hypertension Continue home medications   Dyslipidemia Continue atorvastatin   PAF Continue on Eliquis Continue metoprolol   HFpEF Continue metoprolol Repeat 2D echocardiogram with previous 03/2021 with LVEF 65-70% with grade 1 diastolic dysfunction and moderate LVH Noted to have elevated BNP, but more likely related to significant renal dysfunction   DVT prophylaxis: Eliquis Code Status: Full Family Communication: Daughter at bedside 8/27 Disposition Plan:  Status is: Inpatient Remains inpatient appropriate because: Need for IV antibiotics and inpatient procedures.   Consultants:  Nephrology Palliative care  Procedures:  Ultrasound thoracentesis pending 8/27  Antimicrobials:  Anti-infectives (From admission, onward)    Start     Dose/Rate Route Frequency Ordered Stop   12/05/22 2200  doxycycline (VIBRAMYCIN) 100 mg in sodium chloride 0.9 % 250 mL IVPB        100 mg 125 mL/hr over 120 Minutes Intravenous Every 12 hours 12/04/22 1526     12/05/22 1500  cefTRIAXone (ROCEPHIN) 1 g in sodium chloride 0.9 % 100 mL IVPB        1 g 200 mL/hr over 30 Minutes Intravenous Every 24 hours 12/04/22 1526     12/04/22 1345  cefTRIAXone (ROCEPHIN) 1 g in sodium chloride 0.9 % 100 mL IVPB  1 g 200 mL/hr over 30 Minutes Intravenous  Once 12/04/22 1335 12/04/22 1537   12/04/22 1345  doxycycline (VIBRA-TABS) tablet 100 mg        100 mg Oral  Once 12/04/22 1335 12/04/22 1507      Subjective: Patient seen and  evaluated today and breathing seems slightly better this morning.  They have seen nephrology and were initially uncertain about initiating hemodialysis, but now appears agreeable.  No other acute overnight events noted.   Objective: Vitals:   12/04/22 2337 12/05/22 0347 12/05/22 0724 12/05/22 0920  BP: (!) 177/69 (!) 174/59  (!) 159/85  Pulse: 67 68  71  Resp: 20 20    Temp: 98.3 F (36.8 C) 98.4 F (36.9 C)    TempSrc:      SpO2: 97% 94% 93% 95%  Weight:      Height:        Intake/Output Summary (Last 24 hours) at 12/05/2022 1101 Last data filed at 12/05/2022 0900 Gross per 24 hour  Intake 618.64 ml  Output --  Net 618.64 ml   Filed Weights   12/04/22 1224  Weight: 64.4 kg    Examination:  General exam: Appears calm and comfortable, lethargic Respiratory system: Decreased breath sounds to bilateral bases. Respiratory effort normal.  Nasal cannula Cardiovascular system: S1 & S2 heard, RRR.  Gastrointestinal system: Abdomen is soft Central nervous system: Lethargic Extremities: No edema, left lower extremity dressing C/D/I Skin: No significant lesions noted Psychiatry: Flat affect.    Data Reviewed: I have personally reviewed following labs and imaging studies  CBC: Recent Labs  Lab 12/04/22 1225 12/05/22 0427  WBC 9.8 10.1  HGB 9.3* 8.6*  HCT 29.1* 26.1*  MCV 89.5 89.1  PLT 460* 419*   Basic Metabolic Panel: Recent Labs  Lab 12/04/22 1225 12/05/22 0427  NA 135 135  K 4.2 4.0  CL 107 108  CO2 11* 12*  GLUCOSE 150* 106*  BUN 105* 97*  CREATININE 10.03* 9.97*  CALCIUM 8.7* 8.7*  MG 3.2* 3.0*   GFR: Estimated Creatinine Clearance: 3.7 mL/min (A) (by C-G formula based on SCr of 9.97 mg/dL (H)). Liver Function Tests: No results for input(s): "AST", "ALT", "ALKPHOS", "BILITOT", "PROT", "ALBUMIN" in the last 168 hours. No results for input(s): "LIPASE", "AMYLASE" in the last 168 hours. No results for input(s): "AMMONIA" in the last 168  hours. Coagulation Profile: No results for input(s): "INR", "PROTIME" in the last 168 hours. Cardiac Enzymes: No results for input(s): "CKTOTAL", "CKMB", "CKMBINDEX", "TROPONINI" in the last 168 hours. BNP (last 3 results) No results for input(s): "PROBNP" in the last 8760 hours. HbA1C: No results for input(s): "HGBA1C" in the last 72 hours. CBG: Recent Labs  Lab 12/04/22 1845 12/04/22 2150 12/04/22 2337 12/05/22 0349 12/05/22 0753  GLUCAP 96 110* 104* 112* 119*   Lipid Profile: No results for input(s): "CHOL", "HDL", "LDLCALC", "TRIG", "CHOLHDL", "LDLDIRECT" in the last 72 hours. Thyroid Function Tests: No results for input(s): "TSH", "T4TOTAL", "FREET4", "T3FREE", "THYROIDAB" in the last 72 hours. Anemia Panel: No results for input(s): "VITAMINB12", "FOLATE", "FERRITIN", "TIBC", "IRON", "RETICCTPCT" in the last 72 hours. Sepsis Labs: No results for input(s): "PROCALCITON", "LATICACIDVEN" in the last 168 hours.  Recent Results (from the past 240 hour(s))  Resp panel by RT-PCR (RSV, Flu A&B, Covid) Anterior Nasal Swab     Status: None   Collection Time: 12/04/22 12:26 PM   Specimen: Anterior Nasal Swab  Result Value Ref Range Status   SARS Coronavirus 2 by  RT PCR NEGATIVE NEGATIVE Final    Comment: (NOTE) SARS-CoV-2 target nucleic acids are NOT DETECTED.  The SARS-CoV-2 RNA is generally detectable in upper respiratory specimens during the acute phase of infection. The lowest concentration of SARS-CoV-2 viral copies this assay can detect is 138 copies/mL. A negative result does not preclude SARS-Cov-2 infection and should not be used as the sole basis for treatment or other patient management decisions. A negative result may occur with  improper specimen collection/handling, submission of specimen other than nasopharyngeal swab, presence of viral mutation(s) within the areas targeted by this assay, and inadequate number of viral copies(<138 copies/mL). A negative result  must be combined with clinical observations, patient history, and epidemiological information. The expected result is Negative.  Fact Sheet for Patients:  BloggerCourse.com  Fact Sheet for Healthcare Providers:  SeriousBroker.it  This test is no t yet approved or cleared by the [place] FDA and  has been authorized for detection and/or diagnosis of SARS-CoV-2 by FDA under an Emergency Use Authorization (EUA). This EUA will remain  in effect (meaning this test can be used) for the duration of the COVID-19 declaration under Section 564(b)(1) of the Act, 21 U.S.C.section 360bbb-3(b)(1), unless the authorization is terminated  or revoked sooner.       Influenza A by PCR NEGATIVE NEGATIVE Final   Influenza B by PCR NEGATIVE NEGATIVE Final    Comment: (NOTE) The Xpert Xpress SARS-CoV-2/FLU/RSV plus assay is intended as an aid in the diagnosis of influenza from Nasopharyngeal swab specimens and should not be used as a sole basis for treatment. Nasal washings and aspirates are unacceptable for Xpert Xpress SARS-CoV-2/FLU/RSV testing.  Fact Sheet for Patients: BloggerCourse.com  Fact Sheet for Healthcare Providers: SeriousBroker.it  This test is not yet approved or cleared by the [place] FDA and has been authorized for detection and/or diagnosis of SARS-CoV-2 by FDA under an Emergency Use Authorization (EUA). This EUA will remain in effect (meaning this test can be used) for the duration of the COVID-19 declaration under Section 564(b)(1) of the Act, 21 U.S.C. section 360bbb-3(b)(1), unless the authorization is terminated or revoked.     Resp Syncytial Virus by PCR NEGATIVE NEGATIVE Final    Comment: (NOTE) Fact Sheet for Patients: BloggerCourse.com  Fact Sheet for Healthcare Providers: SeriousBroker.it  This test is  not yet approved or cleared by the [place] FDA and has been authorized for detection and/or diagnosis of SARS-CoV-2 by FDA under an Emergency Use Authorization (EUA). This EUA will remain in effect (meaning this test can be used) for the duration of the COVID-19 declaration under Section 564(b)(1) of the Act, 21 U.S.C. section 360bbb-3(b)(1), unless the authorization is terminated or revoked.  Performed at [facility], [address], [place], [place] [address]          Radiology Studies: US Venous Img Lower  Left (DVT Study)  Result Date: 12/04/2022 CLINICAL DATA:  Short of breath, left lower extremity wound EXAM: LEFT LOWER EXTREMITY VENOUS DOPPLER ULTRASOUND TECHNIQUE: Gray-scale sonography with compression, as well as color and duplex ultrasound, were performed to evaluate the deep venous system(s) from the level of the common femoral vein through the popliteal and proximal calf veins. COMPARISON:  None Available. FINDINGS: VENOUS Normal compressibility of the common femoral, superficial femoral, and popliteal veins, as well as the visualized calf veins. Visualized portions of profunda femoral vein and great saphenous vein unremarkable. No filling defects to suggest DVT on grayscale or color Doppler imaging. Doppler waveforms show normal  direction of venous flow, normal respiratory plasticity and response to augmentation. Limited views of the contralateral common femoral vein are unremarkable. OTHER None. Limitations: none IMPRESSION: Negative. Electronically Signed   By: Malachy Moan M.D.   On: 12/04/2022 13:23   DG Chest Portable 1 View  Result Date: 12/04/2022 CLINICAL DATA:  Shortness of breath.  History of CHF. EXAM: PORTABLE CHEST 1 VIEW COMPARISON:  06/02/2022. FINDINGS: There is a left retrocardiac airspace opacity obscuring the left hemidiaphragm, descending thoracic aorta and blunting the left lateral costophrenic angle suggesting combination of left lower lobe  atelectasis and/or consolidation with pleural effusion. Bilateral lung fields are otherwise clear. Right lateral costophrenic angle is also clear. Stable cardio-mediastinal silhouette. No acute osseous abnormalities. The soft tissues are within normal limits. IMPRESSION: 1. Left lower lobe atelectasis and/or consolidation with pleural effusion. Electronically Signed   By: Jules Schick M.D.   On: 12/04/2022 13:14        Scheduled Meds:  amLODipine  5 mg Oral Daily   apixaban  2.5 mg Oral BID   atorvastatin  20 mg Oral Daily   budesonide (PULMICORT) nebulizer solution  0.25 mg Nebulization BID   darbepoetin (ARANESP) injection - NON-DIALYSIS  60 mcg Subcutaneous Once   famotidine  20 mg Oral QHS   fluticasone  1 spray Each Nare Daily   hydrALAZINE  25 mg Oral TID   insulin pump   Subcutaneous TID WC, HS, 0200   isosorbide mononitrate  60 mg Oral Daily   latanoprost  1 drop Both Eyes QHS   loratadine  10 mg Oral Daily   metoprolol succinate  12.5 mg Oral Daily   Continuous Infusions:  cefTRIAXone (ROCEPHIN)  IV     doxycycline (VIBRAMYCIN) IV     sodium bicarbonate 150 mEq in dextrose 5 % 1,150 mL infusion       LOS: 1 day    Time spent: 35 minutes    Sigmond Patalano Hoover Brunette, DO [facility]  If 7PM-7AM, please contact night-coverage [email or web address] 12/05/2022, 11:01 AM

## 2022-12-05 NOTE — Inpatient Diabetes Management (Addendum)
Inpatient Diabetes Program Recommendations  AACE/ADA: New Consensus Statement on Inpatient Glycemic Control   Target Ranges:  Prepandial:   less than 140 mg/dL      Peak postprandial:   less than 180 mg/dL (1-2 hours)      Critically ill patients:  140 - 180 mg/dL     Latest Reference Range & Units 12/04/22 16:50 12/04/22 18:45 12/04/22 21:50 12/04/22 23:37 12/05/22 03:49 12/05/22 07:53  Glucose-Capillary 70 - 99 mg/dL 72 96 098 (H) 119 (H) 147 (H) 119 (H)   Review of Glycemic Control  Diabetes history: DM2 Outpatient Diabetes medications: OmniPod insulin pump with Humalog, Libre CGM, Ozempic 1 mg Qweek Current orders for Inpatient glycemic control: Insulin Pump AC&HS and 2am  Inpatient Diabetes Program Recommendations:    Insulin Pump: CBGs are well controlled using insulin pump.  NOTE: Patient with DM hx and uses an insulin pump for DM control. Per chart review, patient sees Dr. Kathreen Cosier at [facility] and was last seen on 09/25/22.  Also noted patient was working with Vernon Prey, RD for insulin pump start and changes. Patient seen Vernon Prey, RD on 08/31/22 and per note basal rates are 12A 0.3 units/hour, 8A 0.6 units/hour, 9P 0.3 units/hour; bolus presents are  80-150- 5 units 150-200- 6 units 200-250- 7 units 250-300- 8 units >300- 9 units  Will follow along while inpatient.   Addendum 12/05/22@14 :40-Spoke with patient over the phone. Patient confirms that she has on her insulin pump and CGM. Patient states that her daughter Lamar Laundry does everything with her insulin pump and will be able to provide more information. Called Lamar Laundry (patient's daughter) and she reports that she manages her mother's insulin pump. She confirms that insulin pump settings are as noted in note by Vernon Prey on 08/31/22. She states that she checks her mother's glucose on CGM before meals and she boluses set dose of insulin based on glucose value (uses glucose ranges as noted above).  She reports that her mother's glucose was 170 mg/dl before lunch and then 829 mg/dl on her mother's CGM so she gave her mother 6 units via insulin pump for lunch. She states that she plans to change out her mother's OmniPod insulin pump pod today around 5pm and her Libre2 CGM is good until Saturday. Informed [name] that our team will follow along while inpatient and to encourage staff to page team if any issues arise regarding glycemic control or with insulin pump.  [name] verbalized understanding of information and has no questions at this time.  Thanks, [name], RN, MSN, CDCES Diabetes Coordinator Inpatient Diabetes Program [phone] (Team Pager from 8am to 5pm)

## 2022-12-05 NOTE — Consult Note (Signed)
Consultation Note Date: 12/05/2022   Patient Name: [name] [name]  DOB: 10-22-1940  MRN: [id number]  Age / Sex: 82 y.o., female  PCP: Dorothyann Peng, MD Referring Physician: Erick Blinks, DO  Reason for Consultation: Establishing goals of care  HPI/Patient Profile: 82 y.o. female  with past medical history of HFpEF, DM2 with retinopathy, CKD 4, HTN/HLD, anemia of chronic disease, prior CVA, admitted on 12/04/2022 with acute hypoxic respiratory failure likely related to possible pneumonia with left lower lobe atelectasis versus effusion.   Clinical Assessment and Goals of Care: I have reviewed medical records including EPIC notes, labs and imaging, received report from RN, assessed the patient.  Mrs. Calloway is lying quietly in bed.  She is resting comfortably but wakes easily.  She will briefly make but not keep eye contact.  She is able to tell me her name and where we are, but not the month.  I am not sure that she can make her needs known.  Present today at bedside is her daughter/healthcare power of attorney, Lamar Laundry and eldest granddaughter.  We meet at the bedside along with to discuss diagnosis prognosis, GOC, EOL wishes, disposition and options.  I introduced Palliative Medicine as specialized medical care for people living with serious illness. It focuses on providing relief from the symptoms and stress of a serious illness. The goal is to improve quality of life for both the patient and the family.  We then focused on their current illness.  We talk about chest x-ray from 8/26, ultrasound completed today, plan for thoracentesis, plan to start hemodialysis.  Lamar Laundry shares that although Mrs. Ciccolella had elected to not have hemodialysis, because of this acute illness Mrs. Vassar and her daughters have agreed to a trial of HD.  The natural disease trajectory and expectations at EOL were  discussed.  Discussed the importance of continued conversation with family and the medical providers regarding overall plan of care and treatment options, ensuring decisions are within the context of the patient's values and GOCs.  Questions and concerns were addressed.  The family was encouraged to call with questions or concerns.  PMT will continue to support holistically.  Conference with attending, bedside nursing staff, transition of care team related to patient condition, needs, goals of care, disposition.   HCPOA  HCPOA -daughters, Lamar Laundry and Wynona Neat.    SUMMARY OF RECOMMENDATIONS   At this point continue full scope/full code Anticipate thoracentesis To start trial of HD Time for outcomes   Code Status/Advance Care Planning: Full code  Symptom Management:  Per hospitalist/nephrologist Do not use morphine for those with end-stage kidney disease  Palliative Prophylaxis:  Frequent Pain Assessment and Oral Care  Additional Recommendations (Limitations, Scope, Preferences): Full Scope Treatment  Psycho-social/Spiritual:  Desire for further Chaplaincy support:no Additional Recommendations: Caregiving  Support/Resources  Prognosis:  Unable to determine, based on outcomes.  Guarded at this point.  Discharge Planning: To Be Determined      Primary Diagnoses: Present on Admission:  Acute hypoxemic respiratory failure (HCC)  Anemia  due to chronic kidney disease  Chronic kidney disease (CKD), stage IV (severe) (HCC)  Diabetic retinopathy associated with type 2 diabetes mellitus (HCC)  Essential hypertension  Mixed hyperlipidemia  Wound of left leg   I have reviewed the medical record, interviewed the patient and family, and examined the patient. The following aspects are pertinent.  Past Medical History:  Diagnosis Date   Acute kidney injury superimposed on chronic kidney disease (HCC) 12/02/2021   Acute respiratory failure with hypoxia (HCC) 09/03/2021   Anemia  due to chronic kidney disease 09/03/2021   BPPV (benign paroxysmal positional vertigo), left 01/24/2021   Chronic diastolic CHF (congestive heart failure) (HCC) 09/02/2021   Combined forms of age-related cataract of both eyes 08/24/2016   CVA, old, hemiparesis (HCC) 02/06/2013   Diabetes mellitus (HCC)    Diabetic macular edema (HCC) 08/22/2012   Diabetic retinopathy associated with type 2 diabetes mellitus (HCC) 09/20/2020   Essential hypertension    Gait abnormality 12/26/2021   H/O: CVA (cerebrovascular accident) 04/10/2000   History of falling 12/26/2021   HTN (hypertension)    Hyperlipidemia    Hypermagnesemia 12/02/2021   Hypertensive heart and renal disease 09/20/2020   Hypertensive urgency 09/02/2021   Hypokalemia 12/02/2021   Left ventricular diastolic dysfunction, NYHA class 1 02/06/2013   NVG (neovascular glaucoma), left, severe stage 07/24/2019   Overweight (BMI 25.0-29.9) 10/09/2018   Poorly controlled type 2 diabetes mellitus with circulatory disorder (HCC)    Prolonged QT interval 12/02/2021   Pulmonary HTN (HCC)    mild by echo   Sensorineural hearing loss (SNHL), bilateral 01/24/2021   Stage 3b chronic kidney disease (HCC) 12/26/2021   Syncope and collapse 12/01/2021   Social History   Socioeconomic History   Marital status: Married    Spouse name: Not on file   Number of children: Not on file   Years of education: Not on file   Highest education level: Not on file  Occupational History   Occupation: retired  Tobacco Use   Smoking status: Never   Smokeless tobacco: Never  Vaping Use   Vaping status: Never Used  Substance and Sexual Activity   Alcohol use: No   Drug use: Never   Sexual activity: Not Currently  Other Topics Concern   Not on file  Social History Narrative   Not on file   Social Determinants of Health   Financial Resource Strain: Low Risk  (10/19/2021)   Overall Financial Resource Strain (CARDIA)    Difficulty of Paying Living  Expenses: Not hard at all  Food Insecurity: No Food Insecurity (12/04/2022)   Hunger Vital Sign    Worried About Running Out of Food in the Last Year: Never true    Ran Out of Food in the Last Year: Never true  Transportation Needs: No Transportation Needs (12/04/2022)   PRAPARE - Administrator, Civil Service (Medical): No    Lack of Transportation (Non-Medical): No  Physical Activity: Inactive (10/19/2021)   Exercise Vital Sign    Days of Exercise per Week: 0 days    Minutes of Exercise per Session: 0 min  Stress: No Stress Concern Present (10/19/2021)   [employer] of Occupational Health - Occupational Stress Questionnaire    Feeling of Stress : Not at all  Social Connections: Not on file   Family History  Problem Relation Age of Onset   Hypertension Mother    Hypertension Father    Heart Problems Father    Hypertension Sister  Diabetes Sister    Hypertension Brother    Diabetes Brother    Breast cancer Neg Hx    Scheduled Meds:  amLODipine  5 mg Oral Daily   apixaban  2.5 mg Oral BID   atorvastatin  20 mg Oral Daily   budesonide (PULMICORT) nebulizer solution  0.25 mg Nebulization BID   darbepoetin (ARANESP) injection - NON-DIALYSIS  60 mcg Subcutaneous Once   famotidine  20 mg Oral QHS   fluticasone  1 spray Each Nare Daily   hydrALAZINE  25 mg Oral TID   insulin pump   Subcutaneous TID WC, HS, 0200   isosorbide mononitrate  60 mg Oral Daily   latanoprost  1 drop Both Eyes QHS   loratadine  10 mg Oral Daily   metoprolol succinate  12.5 mg Oral Daily   Continuous Infusions:  cefTRIAXone (ROCEPHIN)  IV     doxycycline (VIBRAMYCIN) IV     sodium bicarbonate 150 mEq in dextrose 5 % 1,150 mL infusion     PRN Meds:.acetaminophen **OR** acetaminophen, guaiFENesin-dextromethorphan, hydrALAZINE, hydrOXYzine, ipratropium-albuterol Medications Prior to Admission:  Prior to Admission medications   Medication Sig Start Date End Date Taking? Authorizing  Provider  acetaminophen (TYLENOL) 500 MG tablet Take 1,000 mg by mouth every 6 (six) hours as needed.   Yes [provider]  amLODipine (NORVASC) 5 MG tablet TAKE ONE TABLET BY MOUTH ONCE DAILY 11/15/22  Yes Lennette Bihari, MD  apixaban (ELIQUIS) 2.5 MG TABS tablet TAKE ONE TABLET BY MOUTH TWICE DAILY 08/18/22  Yes Lennette Bihari, MD  atorvastatin (LIPITOR) 20 MG tablet Take 1 tablet (20 mg total) by mouth daily. 03/31/22 03/31/23 Yes Dorothyann Peng, MD  cetirizine (ZYRTEC ALLERGY) 10 MG tablet Take 1 tablet (10 mg total) by mouth daily. 06/12/22 06/12/23 Yes Dorothyann Peng, MD  cholecalciferol (VITAMIN D3) 25 MCG (1000 UNIT) tablet Take by mouth. 08/12/21  Yes [provider]  doxycycline (VIBRA-TABS) 100 MG tablet Take 1 tablet (100 mg total) by mouth 2 (two) times daily for 5 days. 11/30/22 12/05/22 Yes Ellender Hose, NP  famotidine (PEPCID) 20 MG tablet Take 20 mg by mouth daily. 11/30/22  Yes [provider]  Ferrous Sulfate (IRON PO) Take 65 mg by mouth daily.   Yes [provider]  fluticasone (FLONASE) 50 MCG/ACT nasal spray Place 1 spray into both nostrils daily. 08/12/21  Yes [provider]  furosemide (LASIX) 20 MG tablet Take 1 tablet (20 mg total) by mouth as needed (for swelling once daily). 08/29/22  Yes Lennette Bihari, MD  HUMALOG 100 UNIT/ML injection Inject into the skin as directed. 11/30/22  Yes [provider]  hydrALAZINE (APRESOLINE) 25 MG tablet Take 25 mg by mouth 3 (three) times daily.   Yes [provider]  hydrOXYzine (ATARAX) 25 MG tablet Take 1 tablet (25 mg total) by mouth 3 (three) times daily as needed for itching. 12/16/21  Yes Dorothyann Peng, MD  Insulin Disposable Pump (OMNIPOD 5 G6 PODS, GEN 5,) MISC Use to administer insulin total daily dose 35 units. Change every 3 days 12/01/22  Yes [provider]  insulin lispro (HUMALOG KWIKPEN) 200 UNIT/ML KwikPen inject 8-10 units into THE SKIN 15 minutes BEFORE THREE  main meals of THE DAY Patient taking differently: No sig reported 02/10/22  Yes Carlus Pavlov, MD  isosorbide mononitrate (IMDUR) 30 MG 24 hr tablet Take 60 mg by mouth daily.   Yes [provider]  latanoprost (XALATAN) 0.005 % ophthalmic solution Place  1 drop into both eyes at bedtime.  03/17/19  Yes [provider]  metoprolol succinate (TOPROL-XL) 25 MG 24 hr tablet TAKE 1/2 TABLET BY MOUTH ONCE DAILY 11/15/22  Yes Lennette Bihari, MD  metroNIDAZOLE (METROCREAM) 0.75 % cream Apply 1 Application topically 2 (two) times daily. 11/30/22  Yes [provider]  Multiple Vitamin (MULTIVITAMIN) tablet Take 1 tablet by mouth daily.   Yes [provider]  OZEMPIC, 1 MG/DOSE, 4 MG/3ML SOPN Inject 1 mg into the skin once a week. 06/20/22  Yes [provider]  POTASSIUM BICARBONATE PO Take by mouth. 01/27/13  Yes [provider]  traMADol (ULTRAM) 50 MG tablet Take 1 tablet (50 mg total) by mouth every 8 (eight) hours as needed for up to 5 days. 11/30/22 12/05/22 Yes Ellender Hose, NP  ACCU-CHEK AVIVA PLUS test strip TEST THREE TIMES DAILY AS DIRECTED 07/20/20   Dorothyann Peng, MD  B-D UF III MINI PEN NEEDLES 31G X 5 MM MISC Use as directed 4x a day 12/13/21   Dorothyann Peng, MD  blood glucose meter kit and supplies Dispense based on patient and insurance preference. Use up to four times daily as directed.dx code e11.65 03/08/21   Dorothyann Peng, MD  Blood Glucose Monitoring Suppl (TRUE METRIX METER) DEVI Use to check blood sugars 3 times a day. 03/08/21   Dorothyann Peng, MD  glucose blood (TRUE METRIX BLOOD GLUCOSE TEST) test strip Use as instructed to check blood sugar 3 times a day. Dx code e11.65 03/08/21   Dorothyann Peng, MD  Lancets (ACCU-CHEK SOFT [facility]) lancets Check blood sugars 3 times daily E11.65 02/12/18   Dorothyann Peng, MD   Allergies  Allergen Reactions   Hydralazine Itching   Brimonidine Other (See Comments)    Eyes swell and itch    Dorzolamide Other (See Comments)    Other reaction(s): Other (See Comments)  Red, inflamed eyelid, dermatitis   Latex     Blisters/ skin irritation   Timolol Other (See Comments)    Caused eyes to swell and itch   Review of Systems  Unable to perform ROS: Age    Physical Exam Vitals and nursing note reviewed.     Vital Signs: BP (!) 159/85 (BP Location: Right Arm)   Pulse 71   Temp 98.4 F (36.9 C)   Resp 20   Ht 5\' 2"  (1.575 m)   Wt 64.4 kg   SpO2 95%   BMI 25.97 kg/m  Pain Scale: 0-10   Pain Score: 0-No pain   SpO2: SpO2: 95 % O2 Device:SpO2: 95 % O2 Flow Rate: .O2 Flow Rate (L/min): 2 L/min  IO: Intake/output summary:  Intake/Output Summary (Last 24 hours) at 12/05/2022 6295 Last data filed at 12/05/2022 0600 Gross per 24 hour  Intake 558.64 ml  Output --  Net 558.64 ml    LBM: Last BM Date : 12/04/22 Baseline Weight: Weight: 64.4 kg Most recent weight: Weight: 64.4 kg     Palliative Assessment/Data:     Time In: 1130  Time Out: 1225 Time Total: 55 minutes Greater than 50%  of this time was spent counseling and coordinating care related to the above assessment and plan.  Signed by: Katheran Awe, NP   Please contact Palliative Medicine Team phone at [phone] for questions and concerns.  For individual provider: See Loretha Stapler

## 2022-12-06 ENCOUNTER — Encounter (HOSPITAL_COMMUNITY): Payer: Self-pay | Admitting: Internal Medicine

## 2022-12-06 ENCOUNTER — Inpatient Hospital Stay (HOSPITAL_COMMUNITY): Payer: Medicare Other

## 2022-12-06 DIAGNOSIS — J189 Pneumonia, unspecified organism: Secondary | ICD-10-CM | POA: Diagnosis not present

## 2022-12-06 DIAGNOSIS — E782 Mixed hyperlipidemia: Secondary | ICD-10-CM | POA: Diagnosis not present

## 2022-12-06 DIAGNOSIS — S81802D Unspecified open wound, left lower leg, subsequent encounter: Secondary | ICD-10-CM

## 2022-12-06 DIAGNOSIS — I10 Essential (primary) hypertension: Secondary | ICD-10-CM

## 2022-12-06 DIAGNOSIS — D631 Anemia in chronic kidney disease: Secondary | ICD-10-CM

## 2022-12-06 DIAGNOSIS — N184 Chronic kidney disease, stage 4 (severe): Secondary | ICD-10-CM

## 2022-12-06 DIAGNOSIS — J9601 Acute respiratory failure with hypoxia: Secondary | ICD-10-CM | POA: Diagnosis not present

## 2022-12-06 LAB — CBC
HCT: 25.4 % — ABNORMAL LOW (ref 36.0–46.0)
Hemoglobin: 8.3 g/dL — ABNORMAL LOW (ref 12.0–15.0)
MCH: 29.1 pg (ref 26.0–34.0)
MCHC: 32.7 g/dL (ref 30.0–36.0)
MCV: 89.1 fL (ref 80.0–100.0)
Platelets: 417 10*3/uL — ABNORMAL HIGH (ref 150–400)
RBC: 2.85 MIL/uL — ABNORMAL LOW (ref 3.87–5.11)
RDW: 15.5 % (ref 11.5–15.5)
WBC: 10.6 10*3/uL — ABNORMAL HIGH (ref 4.0–10.5)
nRBC: 0 % (ref 0.0–0.2)

## 2022-12-06 LAB — GLUCOSE, CAPILLARY
Glucose-Capillary: 149 mg/dL — ABNORMAL HIGH (ref 70–99)
Glucose-Capillary: 168 mg/dL — ABNORMAL HIGH (ref 70–99)
Glucose-Capillary: 127 mg/dL — ABNORMAL HIGH (ref 70–99)
Glucose-Capillary: 127 mg/dL — ABNORMAL HIGH (ref 70–99)
Glucose-Capillary: 154 mg/dL — ABNORMAL HIGH (ref 70–99)

## 2022-12-06 LAB — GRAM STAIN

## 2022-12-06 LAB — BASIC METABOLIC PANEL
Anion gap: 15 (ref 5–15)
BUN: 98 mg/dL — ABNORMAL HIGH (ref 8–23)
CO2: 13 mmol/L — ABNORMAL LOW (ref 22–32)
Calcium: 8.3 mg/dL — ABNORMAL LOW (ref 8.9–10.3)
Chloride: 108 mmol/L (ref 98–111)
Creatinine, Ser: 9.81 mg/dL — ABNORMAL HIGH (ref 0.44–1.00)
GFR, Estimated: 4 mL/min — ABNORMAL LOW (ref >60)
Glucose, Bld: 140 mg/dL — ABNORMAL HIGH (ref 70–99)
Potassium: 3.5 mmol/L (ref 3.5–5.1)
Sodium: 136 mmol/L (ref 135–145)

## 2022-12-06 LAB — BODY FLUID CELL COUNT WITH DIFFERENTIAL
Eos, Fluid: 0 %
Lymphs, Fluid: 79 %
Monocyte-Macrophage-Serous Fluid: 2 % — ABNORMAL LOW (ref 50–90)
Neutrophil Count, Fluid: 19 % (ref 0–25)
Total Nucleated Cell Count, Fluid: 588 cu mm (ref 0–1000)

## 2022-12-06 LAB — PROTEIN, PLEURAL OR PERITONEAL FLUID: Total protein, fluid: <3 g/dL

## 2022-12-06 LAB — GLUCOSE, PLEURAL OR PERITONEAL FLUID: Glucose, Fluid: 157 mg/dL

## 2022-12-06 LAB — MAGNESIUM: Magnesium: 2.8 mg/dL — ABNORMAL HIGH (ref 1.7–2.4)

## 2022-12-06 MED ORDER — LIDOCAINE HCL (PF) 2 % IJ SOLN
INTRAMUSCULAR | Status: AC
  Filled 2022-12-06: qty 10

## 2022-12-06 MED ORDER — LIDOCAINE HCL (PF) 2 % IJ SOLN
10.0000 mL | Freq: Once | INTRAMUSCULAR | Status: AC
  Administered 2022-12-06: 10 mL

## 2022-12-06 NOTE — Progress Notes (Signed)
Palliative:  Chart review completed.  Face to face discussion with bedside nursing staff and [facility] team . No needs at this time.   Secure chat with attending, nephrology, bedside nursing staff, transition of care team related to patient goals of care.  Plan:  Continue full scope/full code.  Thoracentesis (L) today removing 700 ml.  Plan for trial of HD, need for temp HD cath.   No charge   Lillia Carmel, NP Palliative Medicine Team  Team phone [phone] Greater than 50% of this time was spent counseling and coordinating care related to the above assessment and plan.

## 2022-12-06 NOTE — Progress Notes (Signed)
[facility]  Nephrology requesting tunnel dialysis catheter. Will place tomorrow. NPO at midnight. Hold blood thinners.  Algis Greenhouse, MD

## 2022-12-06 NOTE — Progress Notes (Signed)
Patient tolerated left sided thoracentesis procedure well today and 700 mL of clear dark yellow fluid removed and sent to lab for processing. Consent obtained from daughter at bedside and daughter verbalized understanding of post procedure instructions. Patient transported via stretcher at this time to have chest xray with no acute distress noted. Report called to patient's nurse Val, LPN at this time.

## 2022-12-06 NOTE — Inpatient Diabetes Management (Signed)
Inpatient Diabetes Program Recommendations  AACE/ADA: New Consensus Statement on Inpatient Glycemic Control   Target Ranges:  Prepandial:   less than 140 mg/dL      Peak postprandial:   less than 180 mg/dL (1-2 hours)      Critically ill patients:  140 - 180 mg/dL    Latest Reference Range & Units 12/05/22 07:53 12/05/22 11:11 12/05/22 17:03 12/05/22 21:18 12/06/22 03:04 12/06/22 07:38 12/06/22 11:11  Glucose-Capillary 70 - 99 mg/dL 952 (H) 841 (H) 98 96 324 (H) 168 (H) 127 (H)   Review of Glycemic Control  Diabetes history: DM2 Outpatient Diabetes medications: OmniPod insulin pump with Humalog, Libre CGM, Ozempic 1 mg Qweek Current orders for Inpatient glycemic control: Insulin Pump AC&HS and 2am   Inpatient Diabetes Program Recommendations:     Insulin Pump: CBGs are well controlled using insulin pump.   NOTE: Patient with DM hx and uses an insulin pump for DM control. Per chart review, patient sees Dr. Kathreen Cosier at [facility] and was last seen on 09/25/22.  Also noted patient was working with Vernon Prey, RD for insulin pump start and changes. Patient seen Vernon Prey, RD on 08/31/22 and per note basal rates are 12A 0.3 units/hour, 8A 0.6 units/hour, 9P 0.3 units/hour; bolus presents are  80-150- 5 units 150-200- 6 units 200-250- 7 units 250-300- 8 units >300- 9 units  Spoke with patient and daughters Lamar Laundry and [place]) at bedside. Patient is using her OmniPod insulin pump and Libre2 for DM control. Lamar Laundry confirms that she changed out her mother's OmniPod insulin pump pod yesterday around 5pm. Lamar Laundry has personal data device and able to verify insulin pump settings which are 12A 0.3 units/hr 8A  0.5 units/hr 9P  0.3 units/hr Total basal in 24 hours: 11.1 units Patient is using OmniPod in Manual mode. Discussed glucose while inpatient has trended fairly well. Lamar Laundry reports that patient will likely be starting dialysis. Discussed how insulin needs may change  after starting dialysis and pump settings may need to be adjusted especially if patient has any hypoglycemia.  Lamar Laundry and Wynona Neat state that they are able to message Vernon Prey, RN with [facility] and they will plan to reach out to her to let her know that their mother may be starting dialysis.  Discussed benefit of using Auto mode on OmniPod as it would adjust the basal insulin based on glucose levels from CGM.  Informed patient and her daughters that our team will follow along while inpatient. Patient and her daughters verbalized understanding and state they have no questions at this time.   Thanks, [name], RN, MSN, CDCES Diabetes Coordinator Inpatient Diabetes Program [phone] (Team Pager from 8am to 5pm)

## 2022-12-06 NOTE — Plan of Care (Signed)
  Problem: Education: Goal: Knowledge of General Education information will improve Description Including pain rating scale, medication(s)/side effects and non-pharmacologic comfort measures Outcome: Progressing   Problem: Health Behavior/Discharge Planning: Goal: Ability to manage health-related needs will improve Outcome: Progressing   

## 2022-12-06 NOTE — Progress Notes (Signed)
Nurse at bedside,patient out of bed to chair. Patient stated" I just feel bad.".Oxygen at 2 liters via nasal canula. Family at bedside. Plan of care ongoing. Nurse reached out to Radiology department regarding time of procedure today. I was informed that it may be around 9 am. Nurse gave this information to family.

## 2022-12-06 NOTE — Progress Notes (Signed)
Patient restless throughout the night, BP elevated, PRN hydralazine given per order, BP slightly decreased. Patient up in chair, some anxiety relief noted.

## 2022-12-06 NOTE — Care Management Important Message (Signed)
Important Message  Patient Details  Name: [name] [name] MRN: [id number] Date of Birth: 17-Jan-1941   Medicare Important Message Given:  N/A - LOS <3 / Initial given by admissions     Corey Harold 12/06/2022, 9:35 AM

## 2022-12-06 NOTE — Progress Notes (Signed)
PROGRESS NOTE    [name] [name]  ZOX:[id number] DOB: 06-Feb-1941 DOA: 12/04/2022 PCP: Dorothyann Peng, MD   Brief Narrative:    [name] [name] is a 82 y.o. female with medical history significant for HFpEF, type 2 diabetes insulin-dependent, CKD stage IV, hypertension, dyslipidemia, chronic anemia of CKD, and prior CVA who presented to the ED with worsening shortness of breath as well as dry cough that began last night.  Patient was admitted with acute hypoxemic respiratory failure-multifactorial in the setting of volume overload as well as suspected pneumonia.  She is also noted to have worsening AKI on CKD stage IV and nephrology recommending hemodialysis, family now appears agreeable.  She is noted to have pleural effusion and will undergo thoracentesis today.  Anticipate hemodialysis initiation in the next 1-2 days.  Assessment & Plan:   Principal Problem:   Acute hypoxemic respiratory failure (HCC) Active Problems:   Chronic kidney disease (CKD), stage IV (severe) (HCC)   Essential hypertension   Mixed hyperlipidemia   Anemia due to chronic kidney disease   Diabetic retinopathy associated with type 2 diabetes mellitus (HCC)   Wound of left leg  Assessment and Plan:  Acute hypoxemic respiratory failure-likely related to possible pneumonia -Off BiPAP; status post thoracentesis. -Left lower lobe atelectasis versus effusion with possible pneumonia. -Continue Rocephin and doxycycline for now and check procalcitonin -Scheduled breathing treatments, flutter valve and incentive spirometry has been encouraged. -Continue to wean off oxygen supplementation as needed.   AKI on CKD stage IV to V with metabolic acidosis Poor oral intake over the last 1 week noted Appreciate nephrology recommendations with plans for bicarb infusion through today Family now appears agreeable for hemodialysis and this will be initiated per nephrology when appropriate -Planning for dialysis catheter on  12/07/2022 and dialysis to be initiated after. -Continue to follow nephrology service recommendation.   Left lower extremity cellulitis/wound -Continue to follow wound care as recommended by wound care service.   -Continue current antibiotics with Rocephin and doxycycline DVT evaluation negative   Elevated BNP -Does not appear to be related to acute CHF -Likely elevated in the setting of AKI on CKD stage IV-V patient a transition point currently based on GFR. -Continue to follow daily weights and strict I's and O's.   Anemia of CKD Receives Retacrit injections outpatient with last dose 8/5 Recent hemoglobin noted to be 10.5 Continue to monitor with repeat CBC, no overt bleeding noted Transfuse for hemoglobin less than 7 Dose of Aranesp 60 mcg per nephrology   DM2 with mild hyperglycemia -Started on carb modified diet -Continue to follow CBGs fluctuation and adjust hypoglycemic regimen as needed.   Hypertension -Continue home antihypertensive agents -Continue as needed hydralazine -Follow vital signs   Dyslipidemia -Continue atorvastatin -Heart healthy diet discussed with patient.   PAF -Eliquis on hold until dialysis catheter placed 12/07/2022; anticipate ability to resume anticoagulation after. -Continue metoprolol -Normal sinus rhythm per telemetry evaluation.   HFpEF Continue metoprolol Repeat 2D echocardiogram with previous 03/2021 with LVEF 65-70% with grade 1 diastolic dysfunction and moderate LVH Noted to have elevated BNP, but more likely related to significant renal dysfunction. -At this moment after discussing with nephrologist and following further deterioration in her renal function plan is to initiate dialysis -Dialysis catheter to be placed tomorrow morning by Dr. Henreitta Leber (general surgery) with plan for dialysis after.   DVT prophylaxis: Eliquis to be resumed once safe after dialysis catheter in place.  Not on SCDs given left lower extremity wound. Code  Status: Full Family Communication: Daughter at bedside 8/28 Disposition Plan:  Status is: Inpatient  Remains inpatient appropriate because: Need for IV antibiotics and inpatient procedures.   Consultants:  Nephrology Palliative care  Procedures:  Ultrasound thoracentesis pending 8/27  Antimicrobials:  Anti-infectives (From admission, onward)    Start     Dose/Rate Route Frequency Ordered Stop   12/05/22 2200  doxycycline (VIBRAMYCIN) 100 mg in sodium chloride 0.9 % 250 mL IVPB        100 mg 125 mL/hr over 120 Minutes Intravenous Every 12 hours 12/04/22 1526     12/05/22 1500  cefTRIAXone (ROCEPHIN) 1 g in sodium chloride 0.9 % 100 mL IVPB        1 g 200 mL/hr over 30 Minutes Intravenous Every 24 hours 12/04/22 1526     12/04/22 1345  cefTRIAXone (ROCEPHIN) 1 g in sodium chloride 0.9 % 100 mL IVPB        1 g 200 mL/hr over 30 Minutes Intravenous  Once 12/04/22 1335 12/04/22 1537   12/04/22 1345  doxycycline (VIBRA-TABS) tablet 100 mg        100 mg Oral  Once 12/04/22 1335 12/04/22 1507      Subjective: Significant difficulty breathing overnight that improved after almost a liter removed during thoracentesis this morning; continue requiring 2 L nasal cannula supplementation.  Expressed to be hungry.  No nausea, no vomiting, no chest pain.  Patient is afebrile.  Objective: Vitals:   12/06/22 0945 12/06/22 0953 12/06/22 1047 12/06/22 1425  BP: (!) 192/80 (!) 180/82 (!) 168/74 (!) 184/70  Pulse: 76 84 72 77  Resp: 20 16    Temp:   98.4 F (36.9 C) 98.3 F (36.8 C)  TempSrc:   Oral Oral  SpO2: 97% 98% 100% 98%  Weight:      Height:        Intake/Output Summary (Last 24 hours) at 12/06/2022 1948 Last data filed at 12/06/2022 1855 Gross per 24 hour  Intake 480 ml  Output 200 ml  Net 280 ml   Filed Weights   12/04/22 1224  Weight: 64.4 kg    Examination: General exam: Alert, awake, following commands appropriately and expressing not feeling good.  Overnight with  significant increase in respiratory distress (this has improved after thoracentesis). Respiratory system: Decreased breath sounds at the bases; positive rhonchi bilaterally.  2 L nasal cannula supplementation in place. Cardiovascular system:RRR. No rub or gallop; no JVD on exam. Gastrointestinal system: Abdomen is nondistended, soft and nontender. No organomegaly or masses felt. Normal bowel sounds heard. Central nervous system: Generally weak.  No focal neurological deficits. Extremities: No cyanosis or clubbing; trace lower extremity edema. Skin: No petechiae; left lower extremity with clean dressings in place.  Patient's cellulitic process improving, no active drainage.  Left anterior shin wound without erythematous border or purulence discharge.  Mildly tender to palpation. Psychiatry: Judgement and insight appear normal.  Flat affect.  Data Reviewed: I have personally reviewed following labs and imaging studies  CBC: Recent Labs  Lab 12/04/22 1225 12/05/22 0427 12/06/22 0404  WBC 9.8 10.1 10.6*  HGB 9.3* 8.6* 8.3*  HCT 29.1* 26.1* 25.4*  MCV 89.5 89.1 89.1  PLT 460* 419* 417*   Basic Metabolic Panel: Recent Labs  Lab 12/04/22 1225 12/05/22 0427 12/06/22 0404  NA 135 135 136  K 4.2 4.0 3.5  CL 107 108 108  CO2 11* 12* 13*  GLUCOSE 150* 106* 140*  BUN 105* 97* 98*  CREATININE 10.03* 9.97*  9.81*  CALCIUM 8.7* 8.7* 8.3*  MG 3.2* 3.0* 2.8*   GFR: Estimated Creatinine Clearance: 3.8 mL/min (A) (by C-G formula based on SCr of 9.81 mg/dL (H)).  CBG: Recent Labs  Lab 12/05/22 2118 12/06/22 0304 12/06/22 0738 12/06/22 1111 12/06/22 1616  GLUCAP 96 149* 168* 127* 127*   Sepsis Labs: Recent Labs  Lab 12/05/22 1418  PROCALCITON 0.35    Recent Results (from the past 240 hour(s))  Resp panel by RT-PCR (RSV, Flu A&B, Covid) Anterior Nasal Swab     Status: None   Collection Time: 12/04/22 12:26 PM   Specimen: Anterior Nasal Swab  Result Value Ref Range Status    SARS Coronavirus 2 by RT PCR NEGATIVE NEGATIVE Final    Comment: (NOTE) SARS-CoV-2 target nucleic acids are NOT DETECTED.  The SARS-CoV-2 RNA is generally detectable in upper respiratory specimens during the acute phase of infection. The lowest concentration of SARS-CoV-2 viral copies this assay can detect is 138 copies/mL. A negative result does not preclude SARS-Cov-2 infection and should not be used as the sole basis for treatment or other patient management decisions. A negative result may occur with  improper specimen collection/handling, submission of specimen other than nasopharyngeal swab, presence of viral mutation(s) within the areas targeted by this assay, and inadequate number of viral copies(<138 copies/mL). A negative result must be combined with clinical observations, patient history, and epidemiological information. The expected result is Negative.  Fact Sheet for Patients:  BloggerCourse.com  Fact Sheet for Healthcare Providers:  SeriousBroker.it  This test is no t yet approved or cleared by the [place] FDA and  has been authorized for detection and/or diagnosis of SARS-CoV-2 by FDA under an Emergency Use Authorization (EUA). This EUA will remain  in effect (meaning this test can be used) for the duration of the COVID-19 declaration under Section 564(b)(1) of the Act, 21 U.S.C.section 360bbb-3(b)(1), unless the authorization is terminated  or revoked sooner.       Influenza A by PCR NEGATIVE NEGATIVE Final   Influenza B by PCR NEGATIVE NEGATIVE Final    Comment: (NOTE) The Xpert Xpress SARS-CoV-2/FLU/RSV plus assay is intended as an aid in the diagnosis of influenza from Nasopharyngeal swab specimens and should not be used as a sole basis for treatment. Nasal washings and aspirates are unacceptable for Xpert Xpress SARS-CoV-2/FLU/RSV testing.  Fact Sheet for  Patients: BloggerCourse.com  Fact Sheet for Healthcare Providers: SeriousBroker.it  This test is not yet approved or cleared by the [place] FDA and has been authorized for detection and/or diagnosis of SARS-CoV-2 by FDA under an Emergency Use Authorization (EUA). This EUA will remain in effect (meaning this test can be used) for the duration of the COVID-19 declaration under Section 564(b)(1) of the Act, 21 U.S.C. section 360bbb-3(b)(1), unless the authorization is terminated or revoked.     Resp Syncytial Virus by PCR NEGATIVE NEGATIVE Final    Comment: (NOTE) Fact Sheet for Patients: BloggerCourse.com  Fact Sheet for Healthcare Providers: SeriousBroker.it  This test is not yet approved or cleared by the [place] FDA and has been authorized for detection and/or diagnosis of SARS-CoV-2 by FDA under an Emergency Use Authorization (EUA). This EUA will remain in effect (meaning this test can be used) for the duration of the COVID-19 declaration under Section 564(b)(1) of the Act, 21 U.S.C. section 360bbb-3(b)(1), unless the authorization is terminated or revoked.  Performed at [facility], [address], [place], [place] [address]   Gram stain  Status: None   Collection Time: 12/06/22  9:40 AM   Specimen: Pleura  Result Value Ref Range Status   Specimen Description PLEURAL  Final   Special Requests NONE  Final   Gram Stain   Final    CYTOSPIN SMEAR NO ORGANISMS SEEN WBC PRESENT,BOTH PMN AND MONONUCLEAR Performed at [facility], [address], [place], [place] [address]    Report Status 12/06/2022 FINAL  Final    Radiology Studies: US THORACENTESIS ASP PLEURAL SPACE W/IMG GUIDE  Result Date: 12/06/2022 INDICATION: Symptomatic LEFT sided pleural effusion EXAM: US THORACENTESIS ASP PLEURAL SPACE W/IMG GUIDE COMPARISON:  Chest XR, 12/04/2022. MEDICATIONS: None.  COMPLICATIONS: None immediate. TECHNIQUE: Informed written consent was obtained from the patient and/or patient's representative after a discussion of the risks, benefits and alternatives to treatment. A timeout was performed prior to the initiation of the procedure. Initial ultrasound scanning demonstrates a small-to-moderate volume LEFT pleural effusion. The lower chest was prepped and draped in the usual sterile fashion. 1% lidocaine was used for local anesthesia. An ultrasound image was saved for documentation purposes. An 8 Fr Safe-T-Centesis catheter was introduced. The thoracentesis was performed. The catheter was removed and a dressing was applied. The patient tolerated the procedure well without immediate post procedural complication. The patient was escorted to have an upright chest radiograph. FINDINGS: A total of approximately 700 mL of serous pleural fluid was removed. Requested samples were sent to the laboratory. IMPRESSION: 1. Successful ultrasound-guided diagnostic and therapeutic LEFT sided thoracentesis yielding 700 mL of pleural fluid. 2. A small volume pleural effusion is present on the contralateral RIGHT chest. Roanna Banning, MD [facility] Radiology Electronically Signed   By: Roanna Banning M.D.   On: 12/06/2022 13:05   DG Chest 1 View  Result Date: 12/06/2022 CLINICAL DATA:  Status post left thoracentesis. EXAM: CHEST  1 VIEW COMPARISON:  12/04/2022 FINDINGS: Stable cardiomediastinal contours. Aortic atherosclerosis. No significant pneumothorax identified status post left thoracentesis. Significant decreased volume of left pleural effusion with improved aeration to the right base. Unchanged small right effusion. IMPRESSION: 1. No significant pneumothorax identified status post left thoracentesis. 2. Significant decreased volume of left pleural effusion with improved aeration to the left base. Electronically Signed   By: Signa Kell M.D.    On: 12/06/2022 10:09   ECHOCARDIOGRAM COMPLETE  Result Date: 12/05/2022    ECHOCARDIOGRAM REPORT   Patient Name:   CLOTILE LEGARE Date of Exam: 12/05/2022 Medical Rec #:  [id number]         Height:       62.0 in Accession #:    [id number]        Weight:       142.0 lb Date of Birth:  [birth year]         BSA:          1.653 m Patient Age:    82 years          BP:           164/66 mmHg Patient Gender: F                 HR:           68 bpm. [facility] Procedure: 2D Echo, Cardiac Doppler, Color Doppler, 3D Echo and Strain Analysis Indications:    CHF-Acute Diastolic I50.31  History:        Patient has prior history of Echocardiogram examinations, most  recent 12/02/2021. CHF, Pulmonary HTN; Risk Factors:Hypertension,                 Dyslipidemia, Diabetes and Non-Smoker.  Sonographer:    Aron Baba Referring Phys: [id number] PRATIK D [facility]  Sonographer Comments: Image acquisition challenging due to respiratory motion. Global longitudinal strain was attempted. IMPRESSIONS  1. Left ventricular ejection fraction, by estimation, is 35%. The left ventricle has moderately decreased function. The left ventricle demonstrates regional wall motion abnormalities (see scoring diagram/findings for description). There is mild left ventricular hypertrophy. Left ventricular diastolic parameters are consistent with Grade II diastolic dysfunction (pseudonormalization). Elevated left atrial pressure.  2. Right ventricular systolic function is normal. The right ventricular size is normal. There is severely elevated pulmonary artery systolic pressure.  3. Left atrial size was moderately dilated.  4. Large pleural effusion in the left lateral region.  5. The mitral valve is abnormal. Moderate mitral valve regurgitation. Mild mitral stenosis.  6. The tricuspid valve is abnormal. Tricuspid valve regurgitation is moderate.  7. The aortic valve is tricuspid. There is mild calcification of the aortic valve. There  is mild thickening of the aortic valve. Aortic valve regurgitation is not visualized. No aortic stenosis is present.  8. The inferior vena cava is normal in size with <50% respiratory variability, suggesting right atrial pressure of 8 mmHg. FINDINGS  Left Ventricle: Left ventricular ejection fraction, by estimation, is 35%. The left ventricle has moderately decreased function. The left ventricle demonstrates regional wall motion abnormalities. The left ventricular internal cavity size was normal in size. There is mild left ventricular hypertrophy. Left ventricular diastolic parameters are consistent with Grade II diastolic dysfunction (pseudonormalization). Elevated left atrial pressure.  LV Wall Scoring: The apical septal segment and apex are akinetic. The anterior septum, mid inferoseptal segment, and basal inferoseptal segment are hypokinetic. Right Ventricle: The right ventricular size is normal. Right vetricular wall thickness was not well visualized. Right ventricular systolic function is normal. There is severely elevated pulmonary artery systolic pressure. The tricuspid regurgitant velocity is 3.66 m/s, and with an assumed right atrial pressure of 8 mmHg, the estimated right ventricular systolic pressure is 61.6 mmHg. Left Atrium: Left atrial size was moderately dilated. Right Atrium: Right atrial size was normal in size. Pericardium: There is no evidence of pericardial effusion. Mitral Valve: The mitral valve is abnormal. There is mild thickening of the mitral valve leaflet(s). There is mild calcification of the mitral valve leaflet(s). Mild mitral annular calcification. Moderate mitral valve regurgitation. Mild mitral valve stenosis. The mean mitral valve gradient is 3.0 mmHg. Tricuspid Valve: The tricuspid valve is abnormal. Tricuspid valve regurgitation is moderate . No evidence of tricuspid stenosis. Aortic Valve: The aortic valve is tricuspid. There is mild calcification of the aortic valve. There is  mild thickening of the aortic valve. There is mild aortic valve annular calcification. Aortic valve regurgitation is not visualized. No aortic stenosis  is present. Aortic valve mean gradient measures 5.7 mmHg. Aortic valve peak gradient measures 12.3 mmHg. Aortic valve area, by VTI measures 1.41 cm. Pulmonic Valve: The pulmonic valve was not well visualized. Pulmonic valve regurgitation is mild. No evidence of pulmonic stenosis. Aorta: The aortic root is normal in size and structure. Venous: The inferior vena cava is normal in size with less than 50% respiratory variability, suggesting right atrial pressure of 8 mmHg. IAS/Shunts: No atrial level shunt detected by color flow Doppler. Additional Comments: There is a large pleural effusion in the left lateral region.  LEFT VENTRICLE PLAX  2D LVIDd:         4.30 cm     Diastology LVIDs:         3.60 cm     LV e' medial:    3.77 cm/s LV PW:         1.20 cm     LV E/e' medial:  45.6 LV IVS:        1.10 cm     LV e' lateral:   7.89 cm/s LVOT diam:     1.80 cm     LV E/e' lateral: 21.8 LV SV:         49 LV SV Index:   30 LVOT Area:     2.54 cm                             3D Volume EF: LV Volumes (MOD)           3D EF:        49 % LV vol d, MOD A2C: 90.4 ml LV EDV:       142 ml LV vol d, MOD A4C: 87.1 ml LV ESV:       72 ml LV vol s, MOD A2C: 50.8 ml LV SV:        70 ml LV vol s, MOD A4C: 42.1 ml LV SV MOD A2C:     39.6 ml LV SV MOD A4C:     87.1 ml LV SV MOD BP:      43.3 ml RIGHT VENTRICLE RV S prime:     12.70 cm/s TAPSE (M-mode): 1.7 cm LEFT ATRIUM             Index        RIGHT ATRIUM           Index LA diam:        4.00 cm 2.42 cm/m   RA Area:     13.10 cm LA Vol (A2C):   89.5 ml 54.15 ml/m  RA Volume:   33.00 ml  19.97 ml/m LA Vol (A4C):   59.7 ml 36.12 ml/m LA Biplane Vol: 74.9 ml 45.32 ml/m  AORTIC VALVE                     PULMONIC VALVE AV Area (Vmax):    1.42 cm      PR End Diast Vel: 7.51 msec AV Area (Vmean):   1.51 cm AV Area (VTI):     1.41 cm AV  Vmax:           175.57 cm/s AV Vmean:          110.256 cm/s AV VTI:            0.346 m AV Peak Grad:      12.3 mmHg AV Mean Grad:      5.7 mmHg LVOT Vmax:         98.20 cm/s LVOT Vmean:        65.600 cm/s LVOT VTI:          0.192 m LVOT/AV VTI ratio: 0.55  AORTA Ao Root diam: 3.20 cm Ao Asc diam:  3.00 cm MITRAL VALVE                TRICUSPID VALVE MV Area (PHT): 2.96 cm     TR Peak grad:   53.6 mmHg MV Mean grad:  3.0 mmHg     TR Vmax:  366.00 cm/s MV Decel Time: 256 msec MR Peak grad: 107.5 mmHg    SHUNTS MR Mean grad: 80.0 mmHg     Systemic VTI:  0.19 m MR Vmax:      518.50 cm/s   Systemic Diam: 1.80 cm MR Vmean:     428.0 cm/s MR PISA:      1.57 cm MV E velocity: 172.00 cm/s MV A velocity: 132.00 cm/s MV E/A ratio:  1.30 Dina Rich MD Electronically signed by Dina Rich MD Signature Date/Time: 12/05/2022/3:48:21 PM    Final    US RENAL  Result Date: 12/05/2022 CLINICAL DATA:  Acute renal insufficiency EXAM: RENAL / URINARY TRACT ULTRASOUND COMPLETE COMPARISON:  05/19/2020 FINDINGS: Right Kidney: Renal measurements: 10.8 x 6.1 x 4.2 cm = volume: 146.5 ML. Echogenicity within normal limits. No mass or hydronephrosis visualized. Left Kidney: Renal measurements: 9.4 x 4.9 x 3.9 cm = volume: 94.0 mL. Echogenicity within normal limits. No mass or hydronephrosis visualized. Bladder: Appears normal for degree of bladder distention. Other: Bilateral pleural effusions are incidentally noted, left greater than right. IMPRESSION: 1. Incidental bilateral pleural effusions, left greater than right. 2. Otherwise unremarkable renal ultrasound. Electronically Signed   By: Sharlet Salina M.D.   On: 12/05/2022 14:50    Scheduled Meds:  amLODipine  5 mg Oral Daily   atorvastatin  20 mg Oral Daily   budesonide (PULMICORT) nebulizer solution  0.25 mg Nebulization BID   famotidine  20 mg Oral QHS   fluticasone  1 spray Each Nare Daily   hydrALAZINE  25 mg Oral TID   insulin pump   Subcutaneous TID WC, HS,  0200   isosorbide mononitrate  60 mg Oral Daily   latanoprost  1 drop Both Eyes QHS   loratadine  10 mg Oral Daily   metoprolol succinate  12.5 mg Oral Daily   Continuous Infusions:  cefTRIAXone (ROCEPHIN)  IV 1 g (12/06/22 1722)   doxycycline (VIBRAMYCIN) IV 100 mg (12/06/22 1507)   sodium bicarbonate 150 mEq in dextrose 5 % 1,150 mL infusion 75 mL/hr at 12/06/22 0442     LOS: 2 days    Time spent: 45 minutes  Rollo Farquhar,MD [facility]  If 7PM-7AM, please contact night-coverage [email or web address] 12/06/2022, 7:48 PM

## 2022-12-06 NOTE — Progress Notes (Signed)
Per Dr. Thomes Dinning check glucose level ACHS and at 3am

## 2022-12-06 NOTE — Progress Notes (Signed)
Patient ID: [name] [name], female   DOB: 1940/12/20, 82 y.o.   MRN: [id number] S: Reports that she is feeling better.  Family is at bedside and agree that she wants to proceed with HD.   O:BP (!) 168/74 (BP Location: Left Arm)   Pulse 72   Temp 98.4 F (36.9 C) (Oral)   Resp 16   Ht 5\' 2"  (1.575 m)   Wt 64.4 kg   SpO2 100%   BMI 25.97 kg/m   Intake/Output Summary (Last 24 hours) at 12/06/2022 1334 Last data filed at 12/06/2022 0846 Gross per 24 hour  Intake 362.64 ml  Output --  Net 362.64 ml   Intake/Output: I/O last 3 completed shifts: In: 1061.3 [P.O.:440; I.V.:521.3; IV Piggyback:100] Out: -   Intake/Output this shift:  No intake/output data recorded. Weight change:  Gen: slowed mentation, + myoclonic jerking CVS: RRR, no rub Resp: decreased BS at bases Abd: +BS, soft, NT/ND Ext: 1+ edema of LLE, wrapping in place.  Recent Labs  Lab 12/04/22 1225 12/05/22 0427 12/06/22 0404  NA 135 135 136  K 4.2 4.0 3.5  CL 107 108 108  CO2 11* 12* 13*  GLUCOSE 150* 106* 140*  BUN 105* 97* 98*  CREATININE 10.03* 9.97* 9.81*  CALCIUM 8.7* 8.7* 8.3*   Liver Function Tests: No results for input(s): "AST", "ALT", "ALKPHOS", "BILITOT", "PROT", "ALBUMIN" in the last 168 hours. No results for input(s): "LIPASE", "AMYLASE" in the last 168 hours. No results for input(s): "AMMONIA" in the last 168 hours. CBC: Recent Labs  Lab 12/04/22 1225 12/05/22 0427 12/06/22 0404  WBC 9.8 10.1 10.6*  HGB 9.3* 8.6* 8.3*  HCT 29.1* 26.1* 25.4*  MCV 89.5 89.1 89.1  PLT 460* 419* 417*   Cardiac Enzymes: No results for input(s): "CKTOTAL", "CKMB", "CKMBINDEX", "TROPONINI" in the last 168 hours. CBG: Recent Labs  Lab 12/05/22 1703 12/05/22 2118 12/06/22 0304 12/06/22 0738 12/06/22 1111  GLUCAP 98 96 149* 168* 127*    Iron Studies: No results for input(s): "IRON", "TIBC", "TRANSFERRIN", "FERRITIN" in the last 72 hours. Studies/Results: US THORACENTESIS ASP PLEURAL SPACE W/IMG  GUIDE  Result Date: 12/06/2022 INDICATION: Symptomatic LEFT sided pleural effusion EXAM: US THORACENTESIS ASP PLEURAL SPACE W/IMG GUIDE COMPARISON:  Chest XR, 12/04/2022. MEDICATIONS: None. COMPLICATIONS: None immediate. TECHNIQUE: Informed written consent was obtained from the patient and/or patient's representative after a discussion of the risks, benefits and alternatives to treatment. A timeout was performed prior to the initiation of the procedure. Initial ultrasound scanning demonstrates a small-to-moderate volume LEFT pleural effusion. The lower chest was prepped and draped in the usual sterile fashion. 1% lidocaine was used for local anesthesia. An ultrasound image was saved for documentation purposes. An 8 Fr Safe-T-Centesis catheter was introduced. The thoracentesis was performed. The catheter was removed and a dressing was applied. The patient tolerated the procedure well without immediate post procedural complication. The patient was escorted to have an upright chest radiograph. FINDINGS: A total of approximately 700 mL of serous pleural fluid was removed. Requested samples were sent to the laboratory. IMPRESSION: 1. Successful ultrasound-guided diagnostic and therapeutic LEFT sided thoracentesis yielding 700 mL of pleural fluid. 2. A small volume pleural effusion is present on the contralateral RIGHT chest. Roanna Banning, MD [facility] Radiology Electronically Signed   By: Roanna Banning M.D.   On: 12/06/2022 13:05   DG Chest 1 View  Result Date: 12/06/2022 CLINICAL DATA:  Status post left thoracentesis. EXAM: CHEST  1 VIEW COMPARISON:  12/04/2022 FINDINGS: Stable cardiomediastinal contours. Aortic atherosclerosis. No significant pneumothorax identified status post left thoracentesis. Significant decreased volume of left pleural effusion with improved aeration to the right base. Unchanged small right effusion. IMPRESSION: 1. No significant pneumothorax  identified status post left thoracentesis. 2. Significant decreased volume of left pleural effusion with improved aeration to the left base. Electronically Signed   By: Signa Kell M.D.   On: 12/06/2022 10:09   ECHOCARDIOGRAM COMPLETE  Result Date: 12/05/2022    ECHOCARDIOGRAM REPORT   Patient Name:   [name] [name] Date of Exam: 12/05/2022 Medical Rec #:  [id number]         Height:       62.0 in Accession #:    [id number]        Weight:       142.0 lb Date of Birth:  04/03/1941         BSA:          1.653 m Patient Age:    82 years          BP:           164/66 mmHg Patient Gender: F                 HR:           68 bpm. [facility] Procedure: 2D Echo, Cardiac Doppler, Color Doppler, 3D Echo and Strain Analysis Indications:    CHF-Acute Diastolic I50.31  History:        Patient has prior history of Echocardiogram examinations, most                 recent 12/02/2021. CHF, Pulmonary HTN; Risk Factors:Hypertension,                 Dyslipidemia, Diabetes and Non-Smoker.  Sonographer:    Aron Baba Referring Phys: [id number] PRATIK D [facility]  Sonographer Comments: Image acquisition challenging due to respiratory motion. Global longitudinal strain was attempted. IMPRESSIONS  1. Left ventricular ejection fraction, by estimation, is 35%. The left ventricle has moderately decreased function. The left ventricle demonstrates regional wall motion abnormalities (see scoring diagram/findings for description). There is mild left ventricular hypertrophy. Left ventricular diastolic parameters are consistent with Grade II diastolic dysfunction (pseudonormalization). Elevated left atrial pressure.  2. Right ventricular systolic function is normal. The right ventricular size is normal. There is severely elevated pulmonary artery systolic pressure.  3. Left atrial size was moderately dilated.  4. Large pleural effusion in the left lateral region.  5. The mitral valve is abnormal. Moderate mitral valve regurgitation.  Mild mitral stenosis.  6. The tricuspid valve is abnormal. Tricuspid valve regurgitation is moderate.  7. The aortic valve is tricuspid. There is mild calcification of the aortic valve. There is mild thickening of the aortic valve. Aortic valve regurgitation is not visualized. No aortic stenosis is present.  8. The inferior vena cava is normal in size with <50% respiratory variability, suggesting right atrial pressure of 8 mmHg. FINDINGS  Left Ventricle: Left ventricular ejection fraction, by estimation, is 35%. The left ventricle has moderately decreased function. The left ventricle demonstrates regional wall motion abnormalities. The left ventricular internal cavity size was normal in size. There is mild left ventricular hypertrophy. Left ventricular diastolic parameters are consistent with Grade II diastolic dysfunction (pseudonormalization). Elevated left atrial pressure.  LV Wall Scoring: The apical septal segment and apex are akinetic. The anterior septum, mid inferoseptal segment, and basal inferoseptal segment are hypokinetic. Right  Ventricle: The right ventricular size is normal. Right vetricular wall thickness was not well visualized. Right ventricular systolic function is normal. There is severely elevated pulmonary artery systolic pressure. The tricuspid regurgitant velocity is 3.66 m/s, and with an assumed right atrial pressure of 8 mmHg, the estimated right ventricular systolic pressure is 61.6 mmHg. Left Atrium: Left atrial size was moderately dilated. Right Atrium: Right atrial size was normal in size. Pericardium: There is no evidence of pericardial effusion. Mitral Valve: The mitral valve is abnormal. There is mild thickening of the mitral valve leaflet(s). There is mild calcification of the mitral valve leaflet(s). Mild mitral annular calcification. Moderate mitral valve regurgitation. Mild mitral valve stenosis. The mean mitral valve gradient is 3.0 mmHg. Tricuspid Valve: The tricuspid valve is  abnormal. Tricuspid valve regurgitation is moderate . No evidence of tricuspid stenosis. Aortic Valve: The aortic valve is tricuspid. There is mild calcification of the aortic valve. There is mild thickening of the aortic valve. There is mild aortic valve annular calcification. Aortic valve regurgitation is not visualized. No aortic stenosis  is present. Aortic valve mean gradient measures 5.7 mmHg. Aortic valve peak gradient measures 12.3 mmHg. Aortic valve area, by VTI measures 1.41 cm. Pulmonic Valve: The pulmonic valve was not well visualized. Pulmonic valve regurgitation is mild. No evidence of pulmonic stenosis. Aorta: The aortic root is normal in size and structure. Venous: The inferior vena cava is normal in size with less than 50% respiratory variability, suggesting right atrial pressure of 8 mmHg. IAS/Shunts: No atrial level shunt detected by color flow Doppler. Additional Comments: There is a large pleural effusion in the left lateral region.  LEFT VENTRICLE PLAX 2D LVIDd:         4.30 cm     Diastology LVIDs:         3.60 cm     LV e' medial:    3.77 cm/s LV PW:         1.20 cm     LV E/e' medial:  45.6 LV IVS:        1.10 cm     LV e' lateral:   7.89 cm/s LVOT diam:     1.80 cm     LV E/e' lateral: 21.8 LV SV:         49 LV SV Index:   30 LVOT Area:     2.54 cm                             3D Volume EF: LV Volumes (MOD)           3D EF:        49 % LV vol d, MOD A2C: 90.4 ml LV EDV:       142 ml LV vol d, MOD A4C: 87.1 ml LV ESV:       72 ml LV vol s, MOD A2C: 50.8 ml LV SV:        70 ml LV vol s, MOD A4C: 42.1 ml LV SV MOD A2C:     39.6 ml LV SV MOD A4C:     87.1 ml LV SV MOD BP:      43.3 ml RIGHT VENTRICLE RV S prime:     12.70 cm/s TAPSE (M-mode): 1.7 cm LEFT ATRIUM             Index        RIGHT ATRIUM  Index LA diam:        4.00 cm 2.42 cm/m   RA Area:     13.10 cm LA Vol (A2C):   89.5 ml 54.15 ml/m  RA Volume:   33.00 ml  19.97 ml/m LA Vol (A4C):   59.7 ml 36.12 ml/m LA Biplane  Vol: 74.9 ml 45.32 ml/m  AORTIC VALVE                     PULMONIC VALVE AV Area (Vmax):    1.42 cm      PR End Diast Vel: 7.51 msec AV Area (Vmean):   1.51 cm AV Area (VTI):     1.41 cm AV Vmax:           175.57 cm/s AV Vmean:          110.256 cm/s AV VTI:            0.346 m AV Peak Grad:      12.3 mmHg AV Mean Grad:      5.7 mmHg LVOT Vmax:         98.20 cm/s LVOT Vmean:        65.600 cm/s LVOT VTI:          0.192 m LVOT/AV VTI ratio: 0.55  AORTA Ao Root diam: 3.20 cm Ao Asc diam:  3.00 cm MITRAL VALVE                TRICUSPID VALVE MV Area (PHT): 2.96 cm     TR Peak grad:   53.6 mmHg MV Mean grad:  3.0 mmHg     TR Vmax:        366.00 cm/s MV Decel Time: 256 msec MR Peak grad: 107.5 mmHg    SHUNTS MR Mean grad: 80.0 mmHg     Systemic VTI:  0.19 m MR Vmax:      518.50 cm/s   Systemic Diam: 1.80 cm MR Vmean:     428.0 cm/s MR PISA:      1.57 cm MV E velocity: 172.00 cm/s MV A velocity: 132.00 cm/s MV E/A ratio:  1.30 Dina Rich MD Electronically signed by Dina Rich MD Signature Date/Time: 12/05/2022/3:48:21 PM    Final    US RENAL  Result Date: 12/05/2022 CLINICAL DATA:  Acute renal insufficiency EXAM: RENAL / URINARY TRACT ULTRASOUND COMPLETE COMPARISON:  05/19/2020 FINDINGS: Right Kidney: Renal measurements: 10.8 x 6.1 x 4.2 cm = volume: 146.5 ML. Echogenicity within normal limits. No mass or hydronephrosis visualized. Left Kidney: Renal measurements: 9.4 x 4.9 x 3.9 cm = volume: 94.0 mL. Echogenicity within normal limits. No mass or hydronephrosis visualized. Bladder: Appears normal for degree of bladder distention. Other: Bilateral pleural effusions are incidentally noted, left greater than right. IMPRESSION: 1. Incidental bilateral pleural effusions, left greater than right. 2. Otherwise unremarkable renal ultrasound. Electronically Signed   By: Sharlet Salina M.D.   On: 12/05/2022 14:50    amLODipine  5 mg Oral Daily   apixaban  2.5 mg Oral BID   atorvastatin  20 mg Oral Daily    budesonide (PULMICORT) nebulizer solution  0.25 mg Nebulization BID   famotidine  20 mg Oral QHS   fluticasone  1 spray Each Nare Daily   hydrALAZINE  25 mg Oral TID   insulin pump   Subcutaneous TID WC, HS, 0200   isosorbide mononitrate  60 mg Oral Daily   latanoprost  1 drop Both Eyes QHS   loratadine  10 mg  Oral Daily   metoprolol succinate  12.5 mg Oral Daily    BMET    Component Value Date/Time   NA 136 12/06/2022 0404   NA 142 06/26/2022 0000   K 3.5 12/06/2022 0404   CL 108 12/06/2022 0404   CO2 13 (L) 12/06/2022 0404   GLUCOSE 140 (H) 12/06/2022 0404   BUN 98 (H) 12/06/2022 0404   BUN 51 (A) 06/26/2022 0000   CREATININE 9.81 (H) 12/06/2022 0404   CREATININE 1.95 (H) 10/09/2018 0905   CALCIUM 8.3 (L) 12/06/2022 0404   GFRNONAA 4 (L) 12/06/2022 0404   GFRNONAA 24 (L) 10/09/2018 0905   GFRAA 20 (L) 03/15/2020 1149   GFRAA 27 (L) 10/09/2018 0905   CBC    Component Value Date/Time   WBC 10.6 (H) 12/06/2022 0404   RBC 2.85 (L) 12/06/2022 0404   HGB 8.3 (L) 12/06/2022 0404   HGB 9.1 (L) 05/30/2022 1001   HCT 25.4 (L) 12/06/2022 0404   HCT 28.3 (L) 05/30/2022 1001   PLT 417 (H) 12/06/2022 0404   PLT 357 05/30/2022 1001   MCV 89.1 12/06/2022 0404   MCV 91 05/30/2022 1001   MCH 29.1 12/06/2022 0404   MCHC 32.7 12/06/2022 0404   RDW 15.5 12/06/2022 0404   RDW 12.7 05/30/2022 1001   LYMPHSABS 1.5 10/16/2022 1438   MONOABS 0.8 10/16/2022 1438   EOSABS 0.2 10/16/2022 1438   BASOSABS 0.1 10/16/2022 1438     Assessment/Plan:  Acute hypoxic respiratory failure - possible pneumonia vs volume overload.  S/p thoracentesis of 700 mL.  Pt reports feeling better CKD stage V - pt has been followed in our office for quite some time and she has always stated that she would rather die than go on dialysis, however it appears that her family has changed her mind.  We discussed the need for [facility] and eventually an AVF/AVG.  WE also discussed home dialysis with PD and HHD.  Family is  interested.  Will need to have TDC placed here by Dr. Henreitta Leber or IR at [facility] and will then plan to start HD tomorrow after placement.  Avoid nephrotoxic medications including NSAIDs and iodinated intravenous contrast exposure unless the latter is absolutely indicated. Preferred narcotic agents for pain control are hydromorphone, fentanyl, and methadone. Morphine should not be used. Avoid Baclofen and avoid oral sodium phosphate and magnesium citrate based laxatives / bowel preps. Continue strict Input and Output monitoring.  Will monitor the patient closely with you and intervene or adjust therapy as indicated by changes in clinical status/labs  Left leg cellulitis - with wound.  Currently on rocephin and doxycycline. Anemia of CKD stage V - started on Aranesp.  Transfuse for Hb <7 DM type 2 - per primary HTN/Volume - has some volume overload.  I's/O's not recorded.  Will UF with HD after TDC placed.  Debilitation - will likely require SNF for short term rehab given her poor functional status.   Irena Cords, MD [facility]

## 2022-12-07 ENCOUNTER — Inpatient Hospital Stay (HOSPITAL_COMMUNITY): Payer: Medicare Other

## 2022-12-07 ENCOUNTER — Encounter (HOSPITAL_COMMUNITY): Admission: EM | Disposition: E | Payer: Self-pay | Source: Home / Self Care | Attending: Internal Medicine

## 2022-12-07 ENCOUNTER — Inpatient Hospital Stay (HOSPITAL_COMMUNITY): Payer: Medicare Other | Admitting: Certified Registered Nurse Anesthetist

## 2022-12-07 ENCOUNTER — Other Ambulatory Visit: Payer: Self-pay

## 2022-12-07 ENCOUNTER — Encounter (HOSPITAL_COMMUNITY): Payer: Self-pay | Admitting: Internal Medicine

## 2022-12-07 DIAGNOSIS — I132 Hypertensive heart and chronic kidney disease with heart failure and with stage 5 chronic kidney disease, or end stage renal disease: Secondary | ICD-10-CM

## 2022-12-07 DIAGNOSIS — N186 End stage renal disease: Secondary | ICD-10-CM

## 2022-12-07 DIAGNOSIS — E1122 Type 2 diabetes mellitus with diabetic chronic kidney disease: Secondary | ICD-10-CM

## 2022-12-07 DIAGNOSIS — S81802D Unspecified open wound, left lower leg, subsequent encounter: Secondary | ICD-10-CM | POA: Diagnosis not present

## 2022-12-07 DIAGNOSIS — J9601 Acute respiratory failure with hypoxia: Secondary | ICD-10-CM | POA: Diagnosis not present

## 2022-12-07 DIAGNOSIS — J189 Pneumonia, unspecified organism: Secondary | ICD-10-CM | POA: Diagnosis not present

## 2022-12-07 DIAGNOSIS — I5032 Chronic diastolic (congestive) heart failure: Secondary | ICD-10-CM | POA: Diagnosis not present

## 2022-12-07 DIAGNOSIS — E782 Mixed hyperlipidemia: Secondary | ICD-10-CM | POA: Diagnosis not present

## 2022-12-07 HISTORY — PX: INSERTION OF DIALYSIS CATHETER: SHX1324

## 2022-12-07 LAB — GLUCOSE, CAPILLARY
Glucose-Capillary: 54 mg/dL — ABNORMAL LOW (ref 70–99)
Glucose-Capillary: 141 mg/dL — ABNORMAL HIGH (ref 70–99)
Glucose-Capillary: 101 mg/dL — ABNORMAL HIGH (ref 70–99)
Glucose-Capillary: 101 mg/dL — ABNORMAL HIGH (ref 70–99)
Glucose-Capillary: 78 mg/dL (ref 70–99)
Glucose-Capillary: 147 mg/dL — ABNORMAL HIGH (ref 70–99)
Glucose-Capillary: 95 mg/dL (ref 70–99)

## 2022-12-07 LAB — SURGICAL PCR SCREEN
MRSA, PCR: NEGATIVE
Staphylococcus aureus: NEGATIVE

## 2022-12-07 LAB — RENAL FUNCTION PANEL
Albumin: 2.2 g/dL — ABNORMAL LOW (ref 3.5–5.0)
Anion gap: 14 (ref 5–15)
BUN: 84 mg/dL — ABNORMAL HIGH (ref 8–23)
CO2: 21 mmol/L — ABNORMAL LOW (ref 22–32)
Calcium: 7.7 mg/dL — ABNORMAL LOW (ref 8.9–10.3)
Chloride: 104 mmol/L (ref 98–111)
Creatinine, Ser: 8.82 mg/dL — ABNORMAL HIGH (ref 0.44–1.00)
GFR, Estimated: 4 mL/min — ABNORMAL LOW (ref >60)
Glucose, Bld: 139 mg/dL — ABNORMAL HIGH (ref 70–99)
Phosphorus: 6.7 mg/dL — ABNORMAL HIGH (ref 2.5–4.6)
Potassium: 2.9 mmol/L — ABNORMAL LOW (ref 3.5–5.1)
Sodium: 139 mmol/L (ref 135–145)

## 2022-12-07 LAB — CBC
HCT: 24.4 % — ABNORMAL LOW (ref 36.0–46.0)
Hemoglobin: 8.1 g/dL — ABNORMAL LOW (ref 12.0–15.0)
MCH: 29.5 pg (ref 26.0–34.0)
MCHC: 33.2 g/dL (ref 30.0–36.0)
MCV: 88.7 fL (ref 80.0–100.0)
Platelets: 436 10*3/uL — ABNORMAL HIGH (ref 150–400)
RBC: 2.75 MIL/uL — ABNORMAL LOW (ref 3.87–5.11)
RDW: 16 % — ABNORMAL HIGH (ref 11.5–15.5)
WBC: 11.4 10*3/uL — ABNORMAL HIGH (ref 4.0–10.5)
nRBC: 0 % (ref 0.0–0.2)

## 2022-12-07 SURGERY — INSERTION OF DIALYSIS CATHETER
Anesthesia: Monitor Anesthesia Care | Site: Chest | Laterality: Right

## 2022-12-07 MED ORDER — LIDOCAINE HCL (PF) 1 % IJ SOLN
INTRAMUSCULAR | Status: DC | PRN
  Administered 2022-12-07: 6 mL via SUBCUTANEOUS

## 2022-12-07 MED ORDER — CHLORHEXIDINE GLUCONATE CLOTH 2 % EX PADS: 6.0000 | MEDICATED_PAD | Freq: Once | CUTANEOUS | Status: DC

## 2022-12-07 MED ORDER — CHLORHEXIDINE GLUCONATE 0.12 % MT SOLN
OROMUCOSAL | Status: AC
  Filled 2022-12-07: qty 15

## 2022-12-07 MED ORDER — CHLORHEXIDINE GLUCONATE CLOTH 2 % EX PADS
6.0000 | MEDICATED_PAD | Freq: Every day | CUTANEOUS | Status: DC
  Administered 2022-12-08 – 2022-12-18 (×11): 6 via TOPICAL

## 2022-12-07 MED ORDER — FENTANYL CITRATE (PF) 100 MCG/2ML IJ SOLN
INTRAMUSCULAR | Status: DC | PRN
  Administered 2022-12-07 (×3): 25 ug via INTRAVENOUS

## 2022-12-07 MED ORDER — PROPOFOL 500 MG/50ML IV EMUL
INTRAVENOUS | Status: DC | PRN
  Administered 2022-12-07: 10 mg via INTRAVENOUS

## 2022-12-07 MED ORDER — MUPIROCIN 2 % EX OINT
1.0000 | TOPICAL_OINTMENT | Freq: Two times a day (BID) | CUTANEOUS | Status: AC
  Administered 2022-12-07 – 2022-12-11 (×10): 1 via NASAL
  Filled 2022-12-07 (×2): qty 22

## 2022-12-07 MED ORDER — LACTATED RINGERS IV SOLN: INTRAVENOUS | Status: DC

## 2022-12-07 MED ORDER — MIDAZOLAM HCL 2 MG/2ML IJ SOLN
INTRAMUSCULAR | Status: DC | PRN
  Administered 2022-12-07: .5 mg via INTRAVENOUS

## 2022-12-07 MED ORDER — LIDOCAINE HCL (PF) 1 % IJ SOLN
INTRAMUSCULAR | Status: AC
  Filled 2022-12-07: qty 60

## 2022-12-07 MED ORDER — HEPARIN SODIUM (PORCINE) 1000 UNIT/ML IJ SOLN
INTRAMUSCULAR | Status: AC
  Filled 2022-12-07: qty 4

## 2022-12-07 MED ORDER — HEPARIN SODIUM (PORCINE) 1000 UNIT/ML DIALYSIS
1000.0000 [IU] | INTRAMUSCULAR | Status: DC | PRN
  Administered 2022-12-08 – 2022-12-15 (×4): 3200 [IU]

## 2022-12-07 MED ORDER — CHLORHEXIDINE GLUCONATE 0.12 % MT SOLN: 15.0000 mL | Freq: Once | OROMUCOSAL | Status: DC

## 2022-12-07 MED ORDER — PROPOFOL 10 MG/ML IV BOLUS
INTRAVENOUS | Status: AC
  Filled 2022-12-07: qty 20

## 2022-12-07 MED ORDER — NEPRO/CARBSTEADY PO LIQD: 237.0000 mL | ORAL | Status: DC | PRN

## 2022-12-07 MED ORDER — DEXTROSE 50 % IV SOLN
25.0000 g | INTRAVENOUS | Status: AC
  Administered 2022-12-07: 25 g via INTRAVENOUS
  Filled 2022-12-07: qty 50

## 2022-12-07 MED ORDER — MIDAZOLAM HCL 2 MG/2ML IJ SOLN
INTRAMUSCULAR | Status: AC
  Filled 2022-12-07: qty 2

## 2022-12-07 MED ORDER — SODIUM CHLORIDE (PF) 0.9 % IJ SOLN
INTRAMUSCULAR | Status: DC | PRN
  Administered 2022-12-07: 500 mL via INTRAVENOUS

## 2022-12-07 MED ORDER — CEFAZOLIN SODIUM-DEXTROSE 2-4 GM/100ML-% IV SOLN: 2.0000 g | INTRAVENOUS | Status: DC

## 2022-12-07 MED ORDER — ORAL CARE MOUTH RINSE: 15.0000 mL | Freq: Once | OROMUCOSAL | Status: DC

## 2022-12-07 MED ORDER — HEPARIN SODIUM (PORCINE) 1000 UNIT/ML IJ SOLN
INTRAMUSCULAR | Status: DC | PRN
  Administered 2022-12-07: 4000 [IU]

## 2022-12-07 MED ORDER — FENTANYL CITRATE (PF) 100 MCG/2ML IJ SOLN
INTRAMUSCULAR | Status: AC
  Filled 2022-12-07: qty 2

## 2022-12-07 MED ORDER — SODIUM CHLORIDE 0.9 % IV SOLN: INTRAVENOUS | Status: DC

## 2022-12-07 MED ORDER — ALTEPLASE 2 MG IJ SOLR: 2.0000 mg | Freq: Once | INTRAMUSCULAR | Status: DC | PRN

## 2022-12-07 MED ORDER — BUPIVACAINE HCL (PF) 0.5 % IJ SOLN
INTRAMUSCULAR | Status: AC
  Filled 2022-12-07: qty 30

## 2022-12-07 SURGICAL SUPPLY — 42 items
ADH SKN CLS APL DERMABOND .7 (GAUZE/BANDAGES/DRESSINGS) ×1
APL PRP STRL LF ISPRP CHG 10.5 (MISCELLANEOUS) ×1
APPLICATOR CHLORAPREP 10.5 ORG (MISCELLANEOUS) ×2 IMPLANT
BAG DECANTER FOR FLEXI CONT (MISCELLANEOUS) ×2 IMPLANT
BIOPATCH RED 1 DISK 7.0 (GAUZE/BANDAGES/DRESSINGS) ×2 IMPLANT
CATH PALINDROME-P 19CM W/VT (CATHETERS) IMPLANT
COVER LIGHT HANDLE STERIS (MISCELLANEOUS) ×4 IMPLANT
COVER PROBE U/S 5X48 (MISCELLANEOUS) ×2 IMPLANT
DECANTER SPIKE VIAL GLASS SM (MISCELLANEOUS) ×4 IMPLANT
DERMABOND ADVANCED .7 DNX12 (GAUZE/BANDAGES/DRESSINGS) ×2 IMPLANT
DRAPE C-ARM FOLDED MOBILE STRL (DRAPES) ×2 IMPLANT
DRAPE CHEST BREAST 15X10 FENES (DRAPES) ×2 IMPLANT
DRSG SORBAVIEW 3.5X5-5/16 MED (GAUZE/BANDAGES/DRESSINGS) ×2 IMPLANT
ELECT REM PT RETURN 9FT ADLT (ELECTROSURGICAL) ×1
ELECTRODE REM PT RTRN 9FT ADLT (ELECTROSURGICAL) ×2 IMPLANT
GAUZE 4X4 16PLY ~~LOC~~+RFID DBL (SPONGE) ×2 IMPLANT
GEL ULTRASOUND 20GR AQUASONIC (MISCELLANEOUS) ×2 IMPLANT
GLOVE BIO SURGEON STRL SZ 6.5 (GLOVE) ×2 IMPLANT
GLOVE BIOGEL PI IND STRL 6.5 (GLOVE) ×2 IMPLANT
GLOVE BIOGEL PI IND STRL 7.0 (GLOVE) ×4 IMPLANT
GOWN STRL REUS W/TWL LRG LVL3 (GOWN DISPOSABLE) ×4 IMPLANT
IV NS 500ML (IV SOLUTION) ×1
IV NS 500ML BAXH (IV SOLUTION) ×2 IMPLANT
KIT BLADEGUARD II DBL (SET/KITS/TRAYS/PACK) ×2 IMPLANT
KIT TURNOVER KIT A (KITS) ×2 IMPLANT
MARKER SKIN DUAL TIP RULER LAB (MISCELLANEOUS) ×2 IMPLANT
NDL HYPO 18GX1.5 BLUNT FILL (NEEDLE) ×2 IMPLANT
NDL HYPO 25X1 1.5 SAFETY (NEEDLE) ×2 IMPLANT
NEEDLE HYPO 18GX1.5 BLUNT FILL (NEEDLE) ×1 IMPLANT
NEEDLE HYPO 25X1 1.5 SAFETY (NEEDLE) ×1 IMPLANT
PACK BASIC III (CUSTOM PROCEDURE TRAY) ×1
PACK SRG BSC III STRL LF ECLPS (CUSTOM PROCEDURE TRAY) ×2 IMPLANT
PAD ARMBOARD 7.5X6 YLW CONV (MISCELLANEOUS) ×2 IMPLANT
PENCIL SMOKE EVACUATOR COATED (MISCELLANEOUS) ×2 IMPLANT
POSITIONER HEAD 8X9X4 ADT (SOFTGOODS) ×2 IMPLANT
SET BASIN LINEN APH (SET/KITS/TRAYS/PACK) ×2 IMPLANT
SUT MNCRL AB 4-0 PS2 18 (SUTURE) ×2 IMPLANT
SUT SILK 2 0 FSL 18 (SUTURE) ×2 IMPLANT
SUT VIC AB 3-0 SH 27 (SUTURE) ×1
SUT VIC AB 3-0 SH 27X BRD (SUTURE) ×2 IMPLANT
SYR 10ML LL (SYRINGE) ×4 IMPLANT
SYR CONTROL 10ML LL (SYRINGE) ×2 IMPLANT

## 2022-12-07 NOTE — TOC Progression Note (Signed)
Transition of Care [facility]) - Progression Note    Patient Details  Name: [name] [name] MRN: [id number] Date of Birth: [birth year]  [facility] CM/SW Contact  Annice Needy, LCSW Phone Number: 12/07/2022, 12:16 PM  Clinical Narrative:    Discussed ongoing HD with daughter. Daughter states that she wants daughter served at [employer] in [place].    Expected Discharge Plan: Skilled Nursing Facility Barriers to Discharge: Continued Medical Work up  Expected Discharge Plan and Services In-house Referral: Clinical Social Work     Living arrangements for the past 2 months: Single Family Home                                       Social Determinants of Health (SDOH) Interventions SDOH Screenings   Food Insecurity: No Food Insecurity (12/04/2022)  Housing: Low Risk  (12/04/2022)  Transportation Needs: No Transportation Needs (12/04/2022)  Utilities: Not At Risk (12/04/2022)  Depression (PHQ2-9): Low Risk  (08/17/2022)  Financial Resource Strain: Low Risk  (10/19/2021)  Physical Activity: Inactive (10/19/2021)  Stress: No Stress Concern Present (10/19/2021)  Tobacco Use: Low Risk  (12/07/2022)    Readmission Risk Interventions    12/05/2022    3:37 PM  Readmission Risk Prevention Plan  Transportation Screening Complete  HRI or Home Care Consult Complete  Social Work Consult for Recovery Care Planning/Counseling Complete  Palliative Care Screening Complete  Medication Review Oceanographer) Complete

## 2022-12-07 NOTE — Progress Notes (Signed)
Patient ID: [name] [name], female   DOB: [birth year], 82 y.o.   MRN: [id number] S: Pt was seen after [facility] placement so is very somnolent.  Some oozing from [facility] exit site. O:BP (!) 193/66 (BP Location: Right Arm)   Pulse 69   Temp 98.1 F (36.7 C) (Oral)   Resp 12   Ht 5\' 2"  (1.575 m)   Wt 64.4 kg   SpO2 98%   BMI 25.97 kg/m   Intake/Output Summary (Last 24 hours) at 12/07/2022 1337 Last data filed at 12/07/2022 1219 Gross per 24 hour  Intake 490 ml  Output 205 ml  Net 285 ml   Intake/Output: I/O last 3 completed shifts: In: 480 [P.O.:480] Out: 200 [Urine:200]  Intake/Output this shift:  Total I/O In: 250 [I.V.:250] Out: 5 [Blood:5] Weight change:  Gen: NAD, resting comfortably CVS: RRR Resp:CTA Abd: +BS, soft, NT/ND Ext: trace pretibial edema, left leg wound wrapping in place.  Recent Labs  Lab 12/04/22 1225 12/05/22 0427 12/06/22 0404  NA 135 135 136  K 4.2 4.0 3.5  CL 107 108 108  CO2 11* 12* 13*  GLUCOSE 150* 106* 140*  BUN 105* 97* 98*  CREATININE 10.03* 9.97* 9.81*  CALCIUM 8.7* 8.7* 8.3*   Liver Function Tests: No results for input(s): "AST", "ALT", "ALKPHOS", "BILITOT", "PROT", "ALBUMIN" in the last 168 hours. No results for input(s): "LIPASE", "AMYLASE" in the last 168 hours. No results for input(s): "AMMONIA" in the last 168 hours. CBC: Recent Labs  Lab 12/04/22 1225 12/05/22 0427 12/06/22 0404  WBC 9.8 10.1 10.6*  HGB 9.3* 8.6* 8.3*  HCT 29.1* 26.1* 25.4*  MCV 89.5 89.1 89.1  PLT 460* 419* 417*   Cardiac Enzymes: No results for input(s): "CKTOTAL", "CKMB", "CKMBINDEX", "TROPONINI" in the last 168 hours. CBG: Recent Labs  Lab 12/06/22 2040 12/07/22 0314 12/07/22 0356 12/07/22 0727 12/07/22 1028  GLUCAP 154* 54* 141* 101* 101*    Iron Studies: No results for input(s): "IRON", "TIBC", "TRANSFERRIN", "FERRITIN" in the last 72 hours. Studies/Results: DG C-Arm 1-60 Min-No Report  Result Date: 12/07/2022 Fluoroscopy was utilized by  the requesting physician.  No radiographic interpretation.   US THORACENTESIS ASP PLEURAL SPACE W/IMG GUIDE  Result Date: 12/06/2022 INDICATION: Symptomatic LEFT sided pleural effusion EXAM: US THORACENTESIS ASP PLEURAL SPACE W/IMG GUIDE COMPARISON:  Chest XR, 12/04/2022. MEDICATIONS: None. COMPLICATIONS: None immediate. TECHNIQUE: Informed written consent was obtained from the patient and/or patient's representative after a discussion of the risks, benefits and alternatives to treatment. A timeout was performed prior to the initiation of the procedure. Initial ultrasound scanning demonstrates a small-to-moderate volume LEFT pleural effusion. The lower chest was prepped and draped in the usual sterile fashion. 1% lidocaine was used for local anesthesia. An ultrasound image was saved for documentation purposes. An 8 Fr Safe-T-Centesis catheter was introduced. The thoracentesis was performed. The catheter was removed and a dressing was applied. The patient tolerated the procedure well without immediate post procedural complication. The patient was escorted to have an upright chest radiograph. FINDINGS: A total of approximately 700 mL of serous pleural fluid was removed. Requested samples were sent to the laboratory. IMPRESSION: 1. Successful ultrasound-guided diagnostic and therapeutic LEFT sided thoracentesis yielding 700 mL of pleural fluid. 2. A small volume pleural effusion is present on the contralateral RIGHT chest. Roanna Banning, MD [facility] Radiology Electronically Signed   By: Roanna Banning M.D.   On: 12/06/2022 13:05   DG Chest 1 View  Result Date: 12/06/2022  CLINICAL DATA:  Status post left thoracentesis. EXAM: CHEST  1 VIEW COMPARISON:  12/04/2022 FINDINGS: Stable cardiomediastinal contours. Aortic atherosclerosis. No significant pneumothorax identified status post left thoracentesis. Significant decreased volume of left pleural effusion with  improved aeration to the right base. Unchanged small right effusion. IMPRESSION: 1. No significant pneumothorax identified status post left thoracentesis. 2. Significant decreased volume of left pleural effusion with improved aeration to the left base. Electronically Signed   By: Signa Kell M.D.   On: 12/06/2022 10:09   ECHOCARDIOGRAM COMPLETE  Result Date: 12/05/2022    ECHOCARDIOGRAM REPORT   Patient Name:   [name] [name] Date of Exam: 12/05/2022 Medical Rec #:  [id number]         Height:       62.0 in Accession #:    [id number]        Weight:       142.0 lb Date of Birth:  1940/06/06         BSA:          1.653 m Patient Age:    82 years          BP:           164/66 mmHg Patient Gender: F                 HR:           68 bpm. [facility] Procedure: 2D Echo, Cardiac Doppler, Color Doppler, 3D Echo and Strain Analysis Indications:    CHF-Acute Diastolic I50.31  History:        Patient has prior history of Echocardiogram examinations, most                 recent 12/02/2021. CHF, Pulmonary HTN; Risk Factors:Hypertension,                 Dyslipidemia, Diabetes and Non-Smoker.  Sonographer:    Aron Baba Referring Phys: [id number] PRATIK D [facility]  Sonographer Comments: Image acquisition challenging due to respiratory motion. Global longitudinal strain was attempted. IMPRESSIONS  1. Left ventricular ejection fraction, by estimation, is 35%. The left ventricle has moderately decreased function. The left ventricle demonstrates regional wall motion abnormalities (see scoring diagram/findings for description). There is mild left ventricular hypertrophy. Left ventricular diastolic parameters are consistent with Grade II diastolic dysfunction (pseudonormalization). Elevated left atrial pressure.  2. Right ventricular systolic function is normal. The right ventricular size is normal. There is severely elevated pulmonary artery systolic pressure.  3. Left atrial size was moderately dilated.  4. Large  pleural effusion in the left lateral region.  5. The mitral valve is abnormal. Moderate mitral valve regurgitation. Mild mitral stenosis.  6. The tricuspid valve is abnormal. Tricuspid valve regurgitation is moderate.  7. The aortic valve is tricuspid. There is mild calcification of the aortic valve. There is mild thickening of the aortic valve. Aortic valve regurgitation is not visualized. No aortic stenosis is present.  8. The inferior vena cava is normal in size with <50% respiratory variability, suggesting right atrial pressure of 8 mmHg. FINDINGS  Left Ventricle: Left ventricular ejection fraction, by estimation, is 35%. The left ventricle has moderately decreased function. The left ventricle demonstrates regional wall motion abnormalities. The left ventricular internal cavity size was normal in size. There is mild left ventricular hypertrophy. Left ventricular diastolic parameters are consistent with Grade II diastolic dysfunction (pseudonormalization). Elevated left atrial pressure.  LV Wall Scoring: The apical septal segment and apex are  akinetic. The anterior septum, mid inferoseptal segment, and basal inferoseptal segment are hypokinetic. Right Ventricle: The right ventricular size is normal. Right vetricular wall thickness was not well visualized. Right ventricular systolic function is normal. There is severely elevated pulmonary artery systolic pressure. The tricuspid regurgitant velocity is 3.66 m/s, and with an assumed right atrial pressure of 8 mmHg, the estimated right ventricular systolic pressure is 61.6 mmHg. Left Atrium: Left atrial size was moderately dilated. Right Atrium: Right atrial size was normal in size. Pericardium: There is no evidence of pericardial effusion. Mitral Valve: The mitral valve is abnormal. There is mild thickening of the mitral valve leaflet(s). There is mild calcification of the mitral valve leaflet(s). Mild mitral annular calcification. Moderate mitral valve  regurgitation. Mild mitral valve stenosis. The mean mitral valve gradient is 3.0 mmHg. Tricuspid Valve: The tricuspid valve is abnormal. Tricuspid valve regurgitation is moderate . No evidence of tricuspid stenosis. Aortic Valve: The aortic valve is tricuspid. There is mild calcification of the aortic valve. There is mild thickening of the aortic valve. There is mild aortic valve annular calcification. Aortic valve regurgitation is not visualized. No aortic stenosis  is present. Aortic valve mean gradient measures 5.7 mmHg. Aortic valve peak gradient measures 12.3 mmHg. Aortic valve area, by VTI measures 1.41 cm. Pulmonic Valve: The pulmonic valve was not well visualized. Pulmonic valve regurgitation is mild. No evidence of pulmonic stenosis. Aorta: The aortic root is normal in size and structure. Venous: The inferior vena cava is normal in size with less than 50% respiratory variability, suggesting right atrial pressure of 8 mmHg. IAS/Shunts: No atrial level shunt detected by color flow Doppler. Additional Comments: There is a large pleural effusion in the left lateral region.  LEFT VENTRICLE PLAX 2D LVIDd:         4.30 cm     Diastology LVIDs:         3.60 cm     LV e' medial:    3.77 cm/s LV PW:         1.20 cm     LV E/e' medial:  45.6 LV IVS:        1.10 cm     LV e' lateral:   7.89 cm/s LVOT diam:     1.80 cm     LV E/e' lateral: 21.8 LV SV:         49 LV SV Index:   30 LVOT Area:     2.54 cm                             3D Volume EF: LV Volumes (MOD)           3D EF:        49 % LV vol d, MOD A2C: 90.4 ml LV EDV:       142 ml LV vol d, MOD A4C: 87.1 ml LV ESV:       72 ml LV vol s, MOD A2C: 50.8 ml LV SV:        70 ml LV vol s, MOD A4C: 42.1 ml LV SV MOD A2C:     39.6 ml LV SV MOD A4C:     87.1 ml LV SV MOD BP:      43.3 ml RIGHT VENTRICLE RV S prime:     12.70 cm/s TAPSE (M-mode): 1.7 cm LEFT ATRIUM             Index  RIGHT ATRIUM           Index LA diam:        4.00 cm 2.42 cm/m   RA Area:     13.10  cm LA Vol (A2C):   89.5 ml 54.15 ml/m  RA Volume:   33.00 ml  19.97 ml/m LA Vol (A4C):   59.7 ml 36.12 ml/m LA Biplane Vol: 74.9 ml 45.32 ml/m  AORTIC VALVE                     PULMONIC VALVE AV Area (Vmax):    1.42 cm      PR End Diast Vel: 7.51 msec AV Area (Vmean):   1.51 cm AV Area (VTI):     1.41 cm AV Vmax:           175.57 cm/s AV Vmean:          110.256 cm/s AV VTI:            0.346 m AV Peak Grad:      12.3 mmHg AV Mean Grad:      5.7 mmHg LVOT Vmax:         98.20 cm/s LVOT Vmean:        65.600 cm/s LVOT VTI:          0.192 m LVOT/AV VTI ratio: 0.55  AORTA Ao Root diam: 3.20 cm Ao Asc diam:  3.00 cm MITRAL VALVE                TRICUSPID VALVE MV Area (PHT): 2.96 cm     TR Peak grad:   53.6 mmHg MV Mean grad:  3.0 mmHg     TR Vmax:        366.00 cm/s MV Decel Time: 256 msec MR Peak grad: 107.5 mmHg    SHUNTS MR Mean grad: 80.0 mmHg     Systemic VTI:  0.19 m MR Vmax:      518.50 cm/s   Systemic Diam: 1.80 cm MR Vmean:     428.0 cm/s MR PISA:      1.57 cm MV E velocity: 172.00 cm/s MV A velocity: 132.00 cm/s MV E/A ratio:  1.30 Dina Rich MD Electronically signed by Dina Rich MD Signature Date/Time: 12/05/2022/3:48:21 PM    Final     amLODipine  5 mg Oral Daily   atorvastatin  20 mg Oral Daily   budesonide (PULMICORT) nebulizer solution  0.25 mg Nebulization BID   chlorhexidine       Chlorhexidine Gluconate Cloth  6 each Topical Q0600   famotidine  20 mg Oral QHS   fluticasone  1 spray Each Nare Daily   hydrALAZINE  25 mg Oral TID   insulin pump   Subcutaneous TID WC, HS, 0200   isosorbide mononitrate  60 mg Oral Daily   latanoprost  1 drop Both Eyes QHS   loratadine  10 mg Oral Daily   metoprolol succinate  12.5 mg Oral Daily   mupirocin ointment  1 Application Nasal BID    BMET    Component Value Date/Time   NA 136 12/06/2022 0404   NA 142 06/26/2022 0000   K 3.5 12/06/2022 0404   CL 108 12/06/2022 0404   CO2 13 (L) 12/06/2022 0404   GLUCOSE 140 (H) 12/06/2022  0404   BUN 98 (H) 12/06/2022 0404   BUN 51 (A) 06/26/2022 0000   CREATININE 9.81 (H) 12/06/2022 0404   CREATININE 1.95 (H) 10/09/2018 5732  CALCIUM 8.3 (L) 12/06/2022 0404   GFRNONAA 4 (L) 12/06/2022 0404   GFRNONAA 24 (L) 10/09/2018 0905   GFRAA 20 (L) 03/15/2020 1149   GFRAA 27 (L) 10/09/2018 0905   CBC    Component Value Date/Time   WBC 10.6 (H) 12/06/2022 0404   RBC 2.85 (L) 12/06/2022 0404   HGB 8.3 (L) 12/06/2022 0404   HGB 9.1 (L) 05/30/2022 1001   HCT 25.4 (L) 12/06/2022 0404   HCT 28.3 (L) 05/30/2022 1001   PLT 417 (H) 12/06/2022 0404   PLT 357 05/30/2022 1001   MCV 89.1 12/06/2022 0404   MCV 91 05/30/2022 1001   MCH 29.1 12/06/2022 0404   MCHC 32.7 12/06/2022 0404   RDW 15.5 12/06/2022 0404   RDW 12.7 05/30/2022 1001   LYMPHSABS 1.5 10/16/2022 1438   MONOABS 0.8 10/16/2022 1438   EOSABS 0.2 10/16/2022 1438   BASOSABS 0.1 10/16/2022 1438    Assessment/Plan:   Acute hypoxic respiratory failure - possible pneumonia vs volume overload.  S/p thoracentesis of 700 mL 12/06/22 with some improvement.  Plan for HD today with UF. CKD stage V - pt has been followed in our office for quite some time and she has always stated that she would rather die than go on dialysis, however it appears that her family has changed her mind.  We discussed the need for [facility] and eventually an AVF/AVG.  WE also discussed home dialysis with PD and HHD.  Family is interested.  She had TDC placed 12/07/22 by Dr. Henreitta Leber and will plan to start HD today and again tomorrow and Saturday with slow increases in time and bfr.  Avoid nephrotoxic medications including NSAIDs and iodinated intravenous contrast exposure unless the latter is absolutely indicated. Preferred narcotic agents for pain control are hydromorphone, fentanyl, and methadone. Morphine should not be used. Avoid Baclofen and avoid oral sodium phosphate and magnesium citrate based laxatives / bowel preps. Continue strict Input and Output  monitoring.  Will monitor the patient closely with you and intervene or adjust therapy as indicated by changes in clinical status/labs  Left leg cellulitis - with wound.  Currently on rocephin and doxycycline. Anemia of CKD stage V - started on Aranesp.  Transfuse for Hb <7 DM type 2 - per primary HTN/Volume - has some volume overload.  I's/O's not recorded.  Will UF with HD after TDC placed.  Debilitation - will likely require SNF for short term rehab given her poor functional status.   Irena Cords, MD [facility]

## 2022-12-07 NOTE — Progress Notes (Signed)
   INITIAL HEMODIALYSIS TREATMENT NOTE (HD#1):  2 hour first-ever heparin-free treatment completed using RIJ TDC.  Catheter tolerated prescribed flow with stable pressures.  Some sanguinous oozing noted at exit site; dressing changed.  Goal met: 1 liter removed without interruption in UF.  Hemodynamically stable.  All blood was returned.  Pre-HD K 2.9.  Results were received AFTER completion of treatment on 2K bath. Primary nurse Ronnald Nian was notified.    Post HD:  12/07/22 2145  Vitals  BP (!) 176/86  MAP (mmHg) 113  BP Location Right Arm  BP Method Automatic  Patient Position (if appropriate) Lying  Pulse Rate 70  Pulse Rate Source Monitor  ECG Heart Rate 69  Resp 16  Oxygen Therapy  SpO2 100 %  O2 Device Nasal Cannula  O2 Flow Rate (L/min) 3 L/min  Hepatitis B Pre Treatment Patient Checks  Hepatitis B Surface Antigen Results Pending (labs drawn, awaiting results)  Date Hepatitis B Surface Antigen Drawn 12/07/22  Hep B Antibody Quant/Post still pending  Date Hep B Antibody Quant/Post Drawn 12/07/22  Patient's Immunity Status Pending (labs drawn, awaiting results)  Isolation Initiated Unknown Hepatitis status (chemical disinfection of Tablo 2)  Post Treatment  Dialyzer Clearance Lightly streaked  Hemodialysis Intake (mL) 0 mL  Liters Processed 24  Fluid Removed (mL) 1000 mL  Tolerated HD Treatment Yes  Post-Hemodialysis Comments Goal met.  Hemodialysis Catheter Right Internal jugular Double lumen Permanent (Tunneled)  Placement Date/Time: 12/07/22 1215   Placed prior to admission: No  Serial / Lot #: [id number]  Expiration Date: 10/15/25  Time Out: Correct patient;Correct site;Correct procedure  Maximum sterile barrier precautions: Hand hygiene;Cap;Mask;Sterile gow...  Site Condition No complications  Blue Lumen Status Flushed;Heparin locked;Dead end cap in place  Red Lumen Status Flushed;Heparin locked;Dead end cap in place  Purple Lumen Status N/A  Catheter fill  solution Heparin 1000 units/ml  Catheter fill volume (Arterial) 1.6 cc  Catheter fill volume (Venous) 1.6  Dressing Type Transparent;Tube stabilization device  Dressing Status Antimicrobial disc in place;Other (Comment)  Interventions Dressing changed;New dressing  Drainage Description None  Dressing Change Due 12/11/22  Post treatment catheter status Capped and Clamped    Pt was transported back to 317.  Hand-off given to Karn Pickler, RN  Arman Filter, RN [facility]

## 2022-12-07 NOTE — Consult Note (Signed)
[facility] Consult  Reason for Consult: ESRD needing dialysis  Referring Physician:  Dr. Gwenlyn Perking Dr. Reynolds Bowl  Chief Complaint   Shortness of Breath     HPI: [name] [name] is a 82 y.o. female with worsening renal failure, CHF, prior CVA, PAF, DM, HTN who comes in with left lower leg cellulitis, PNA and worsening renal function. Nephrololgy has seen her and recommends she start dialysis treatment and asked for a tunneled dialysis catheter. She has never had a catheter prior. She has been on eliquis but this was held yesterday.   Past Medical History:  Diagnosis Date   Acute kidney injury superimposed on chronic kidney disease (HCC) 12/02/2021   Acute respiratory failure with hypoxia (HCC) 09/03/2021   Anemia due to chronic kidney disease 09/03/2021   BPPV (benign paroxysmal positional vertigo), left 01/24/2021   Chronic diastolic CHF (congestive heart failure) (HCC) 09/02/2021   Combined forms of age-related cataract of both eyes 08/24/2016   CVA, old, hemiparesis (HCC) 02/06/2013   Diabetes mellitus (HCC)    Diabetic macular edema (HCC) 08/22/2012   Diabetic retinopathy associated with type 2 diabetes mellitus (HCC) 09/20/2020   Essential hypertension    Gait abnormality 12/26/2021   H/O: CVA (cerebrovascular accident) 04/10/2000   History of falling 12/26/2021   HTN (hypertension)    Hyperlipidemia    Hypermagnesemia 12/02/2021   Hypertensive heart and renal disease 09/20/2020   Hypertensive urgency 09/02/2021   Hypokalemia 12/02/2021   Left ventricular diastolic dysfunction, NYHA class 1 02/06/2013   NVG (neovascular glaucoma), left, severe stage 07/24/2019   Overweight (BMI 25.0-29.9) 10/09/2018   Poorly controlled type 2 diabetes mellitus with circulatory disorder (HCC)    Prolonged QT interval 12/02/2021   Pulmonary HTN (HCC)    mild by echo   Sensorineural hearing loss (SNHL), bilateral 01/24/2021   Stage 3b chronic kidney disease (HCC)  12/26/2021   Syncope and collapse 12/01/2021    Past Surgical History:  Procedure Laterality Date   ABDOMINAL HYSTERECTOMY     CARDIAC CATHETERIZATION  03/17/2003   Normal, patent coronary arteries. Normal LV systolic function   CARDIOVASCULAR STRESS TEST  08/28/2006   Small sized, moderate ischemiain the Basal Inferoseptal, Basal Inferior, Mid Inferior and Apical Inferior regions   TRANSTHORACIC ECHOCARDIOGRAM  02/17/2010   EF >55%, moderate-severely thickened septum, mild-moderate mitral annular calcification    Family History  Problem Relation Age of Onset   Hypertension Mother    Hypertension Father    Heart Problems Father    Hypertension Sister    Diabetes Sister    Hypertension Brother    Diabetes Brother    Breast cancer Neg Hx     Social History   Tobacco Use   Smoking status: Never   Smokeless tobacco: Never  Vaping Use   Vaping status: Never Used  Substance Use Topics   Alcohol use: No   Drug use: Never    Medications: I have reviewed the patient's current medications. Current Facility-Administered Medications  Medication Dose Route Frequency Provider Last Rate Last Admin   [MAR Hold] acetaminophen (TYLENOL) tablet 650 mg  650 mg Oral Q6H PRN Maurilio Lovely D, DO   650 mg at 12/06/22 1501   Or   [MAR Hold] acetaminophen (TYLENOL) suppository 650 mg  650 mg Rectal Q6H PRN Sherryll Burger, Pratik D, DO       [MAR Hold] amLODipine (NORVASC) tablet 5 mg  5 mg Oral Daily Sherryll Burger, Pratik D, DO   5 mg at 12/06/22 1502   [  MAR Hold] atorvastatin (LIPITOR) tablet 20 mg  20 mg Oral Daily Sherryll Burger, Pratik D, DO   20 mg at 12/06/22 1502   [MAR Hold] budesonide (PULMICORT) nebulizer solution 0.25 mg  0.25 mg Nebulization BID Sherryll Burger, Pratik D, DO   0.25 mg at 12/07/22 9629   ceFAZolin (ANCEF) IVPB 2g/100 mL premix  2 g Intravenous On Call to OR Lucretia Roers, MD       [facility] Hold] cefTRIAXone (ROCEPHIN) 1 g in sodium chloride 0.9 % 100 mL IVPB  1 g Intravenous Q24H Sherryll Burger, Pratik D, DO 200  mL/hr at 12/06/22 1722 1 g at 12/06/22 1722   chlorhexidine (PERIDEX) 0.12 % solution 15 mL  15 mL Mouth/Throat Once Windell Norfolk, MD       Or   Oral care mouth rinse  15 mL Mouth Rinse Once Kiel, Mosetta Putt, MD       chlorhexidine (PERIDEX) 0.12 % solution            Chlorhexidine Gluconate Cloth 2 % PADS 6 each  6 each Topical Once Lucretia Roers, MD       And   Chlorhexidine Gluconate Cloth 2 % PADS 6 each  6 each Topical Once Lucretia Roers, MD       [facility] Hold] Chlorhexidine Gluconate Cloth 2 % PADS 6 each  6 each Topical Q0600 Terrial Rhodes, MD       [facility] Hold] doxycycline (VIBRAMYCIN) 100 mg in sodium chloride 0.9 % 250 mL IVPB  100 mg Intravenous Q12H Maurilio Lovely D, DO 125 mL/hr at 12/06/22 2148 100 mg at 12/06/22 2148   [MAR Hold] famotidine (PEPCID) tablet 20 mg  20 mg Oral QHS Shah, Pratik D, DO   20 mg at 12/06/22 2142   [MAR Hold] fluticasone (FLONASE) 50 MCG/ACT nasal spray 1 spray  1 spray Each Nare Daily Sherryll Burger, Pratik D, DO   1 spray at 12/06/22 1244   [MAR Hold] guaiFENesin-dextromethorphan (ROBITUSSIN DM) 100-10 MG/5ML syrup 5 mL  5 mL Oral Q4H PRN Sherryll Burger, Pratik D, DO   5 mL at 12/05/22 0940   [MAR Hold] hydrALAZINE (APRESOLINE) injection 10 mg  10 mg Intravenous Q4H PRN Sherryll Burger, Pratik D, DO   10 mg at 12/06/22 0458   [MAR Hold] hydrALAZINE (APRESOLINE) tablet 25 mg  25 mg Oral TID Maurilio Lovely D, DO   25 mg at 12/06/22 2142   [facility] Hold] hydrOXYzine (ATARAX) tablet 10 mg  10 mg Oral TID PRN Frankey Shown, DO   10 mg at 12/05/22 0940   [MAR Hold] insulin pump   Subcutaneous TID WC, HS, 0200 Sherryll Burger, Pratik D, DO       [MAR Hold] ipratropium-albuterol (DUONEB) 0.5-2.5 (3) MG/3ML nebulizer solution 3 mL  3 mL Nebulization Q6H PRN Sherryll Burger, Pratik D, DO       [MAR Hold] isosorbide mononitrate (IMDUR) 24 hr tablet 60 mg  60 mg Oral Daily Sherryll Burger, Pratik D, DO   60 mg at 12/06/22 1501   lactated ringers infusion   Intravenous Continuous Kiel, Mosetta Putt, MD       [MAR Hold]  latanoprost (XALATAN) 0.005 % ophthalmic solution 1 drop  1 drop Both Eyes QHS Sherryll Burger, Pratik D, DO   1 drop at 12/06/22 2151   [MAR Hold] loratadine (CLARITIN) tablet 10 mg  10 mg Oral Daily Sherryll Burger, Pratik D, DO   10 mg at 12/06/22 1501   [MAR Hold] metoprolol succinate (TOPROL-XL) 24 hr tablet 12.5 mg  12.5 mg  Oral Daily Sherryll Burger, Pratik D, DO   12.5 mg at 12/06/22 1502   [MAR Hold] mupirocin ointment (BACTROBAN) 2 % 1 Application  1 Application Nasal BID Lucretia Roers, MD   1 Application at 12/07/22 0327   sodium bicarbonate 150 mEq in dextrose 5 % 1,150 mL infusion   Intravenous Continuous Anthony Sar, MD 75 mL/hr at 12/07/22 0023 New Bag at 12/07/22 0023    Allergies  Allergen Reactions   Hydralazine Itching   Brimonidine Other (See Comments)    Eyes swell and itch   Dorzolamide Other (See Comments)    Other reaction(s): Other (See Comments)  Red, inflamed eyelid, dermatitis   Latex     Blisters/ skin irritation   Timolol Other (See Comments)    Caused eyes to swell and itch     ROS:  A comprehensive review of systems was negative except for: Genitourinary: positive for ESRD Musculoskeletal: positive for cellulitis left lower extremity  Blood pressure (!) 159/76, pulse 74, temperature 98.1 F (36.7 C), temperature source Oral, resp. rate 14, height 5\' 2"  (1.575 m), weight 64.4 kg, SpO2 100%. Physical Exam Vitals reviewed.  HENT:     Head: Normocephalic.  Cardiovascular:     Rate and Rhythm: Normal rate.     Comments: US of the R internal jugular with good open internal jugular  Pulmonary:     Effort: Pulmonary effort is normal.  Abdominal:     Palpations: Abdomen is soft.  Musculoskeletal:     Comments: Left lower leg wrapped with kerlix  Skin:    General: Skin is warm.  Neurological:     General: No focal deficit present.     Mental Status: She is alert.  Psychiatric:        Mood and Affect: Mood normal.     Results: Results for orders placed or performed  during the hospital encounter of 12/04/22 (from the past 48 hour(s))  Glucose, capillary     Status: Abnormal   Collection Time: 12/05/22 11:11 AM  Result Value Ref Range   Glucose-Capillary 170 (H) 70 - 99 mg/dL    Comment: Glucose reference range applies only to samples taken after fasting for at least 8 hours.  Procalcitonin     Status: None   Collection Time: 12/05/22  2:18 PM  Result Value Ref Range   Procalcitonin 0.35 ng/mL    Comment:        Interpretation: PCT (Procalcitonin) <= 0.5 ng/mL: Systemic infection (sepsis) is not likely. Local bacterial infection is possible. (NOTE)       Sepsis PCT Algorithm           Lower Respiratory Tract                                      Infection PCT Algorithm    ----------------------------     ----------------------------         PCT < 0.25 ng/mL                PCT < 0.10 ng/mL          Strongly encourage             Strongly discourage   discontinuation of antibiotics    initiation of antibiotics    ----------------------------     -----------------------------       PCT 0.25 - 0.50 ng/mL  PCT 0.10 - 0.25 ng/mL               OR       >80% decrease in PCT            Discourage initiation of                                            antibiotics      Encourage discontinuation           of antibiotics    ----------------------------     -----------------------------         PCT >= 0.50 ng/mL              PCT 0.26 - 0.50 ng/mL               AND        <80% decrease in PCT             Encourage initiation of                                             antibiotics       Encourage continuation           of antibiotics    ----------------------------     -----------------------------        PCT >= 0.50 ng/mL                  PCT > 0.50 ng/mL               AND         increase in PCT                  Strongly encourage                                      initiation of antibiotics    Strongly encourage escalation            of antibiotics                                     -----------------------------                                           PCT <= 0.25 ng/mL                                                 OR                                        > 80% decrease in PCT  Discontinue / Do not initiate                                             antibiotics  Performed at [facility], [address], [place], [place] [address]   Glucose, capillary     Status: None   Collection Time: 12/05/22  5:03 PM  Result Value Ref Range   Glucose-Capillary 98 70 - 99 mg/dL    Comment: Glucose reference range applies only to samples taken after fasting for at least 8 hours.   Comment 1 Notify RN    Comment 2 Document in Chart   Urinalysis, Routine w reflex microscopic -Urine, Unspecified Source     Status: Abnormal   Collection Time: 12/05/22  8:39 PM  Result Value Ref Range   Color, Urine YELLOW YELLOW   APPearance CLEAR CLEAR   Specific Gravity, Urine 1.010 1.005 - 1.030   pH 5.0 5.0 - 8.0   Glucose, UA 50 (A) NEGATIVE mg/dL   Hgb urine dipstick SMALL (A) NEGATIVE   Bilirubin Urine NEGATIVE NEGATIVE   Ketones, ur NEGATIVE NEGATIVE mg/dL   Protein, ur >=951 (A) NEGATIVE mg/dL   Nitrite NEGATIVE NEGATIVE   Leukocytes,Ua NEGATIVE NEGATIVE   RBC / HPF 0-5 0 - 5 RBC/hpf   WBC, UA 6-10 0 - 5 WBC/hpf   Bacteria, UA RARE (A) NONE SEEN   Squamous Epithelial / HPF 0-5 0 - 5 /HPF   WBC Clumps PRESENT     Comment: Performed at [facility], [address], [place], [place] [address]  Glucose, capillary     Status: None   Collection Time: 12/05/22  9:18 PM  Result Value Ref Range   Glucose-Capillary 96 70 - 99 mg/dL    Comment: Glucose reference range applies only to samples taken after fasting for at least 8 hours.   Comment 1 Notify RN    Comment 2 Document in Chart   Glucose, capillary     Status: Abnormal   Collection Time: 12/06/22  3:04 AM  Result Value Ref Range    Glucose-Capillary 149 (H) 70 - 99 mg/dL    Comment: Glucose reference range applies only to samples taken after fasting for at least 8 hours.   Comment 1 QC Due   Basic metabolic panel     Status: Abnormal   Collection Time: 12/06/22  4:04 AM  Result Value Ref Range   Sodium 136 135 - 145 mmol/L   Potassium 3.5 3.5 - 5.1 mmol/L   Chloride 108 98 - 111 mmol/L   CO2 13 (L) 22 - 32 mmol/L   Glucose, Bld 140 (H) 70 - 99 mg/dL    Comment: Glucose reference range applies only to samples taken after fasting for at least 8 hours.   BUN 98 (H) 8 - 23 mg/dL   Creatinine, Ser 6.06 (H) 0.44 - 1.00 mg/dL   Calcium 8.3 (L) 8.9 - 10.3 mg/dL   GFR, Estimated 4 (L) >60 mL/min    Comment: (NOTE) Calculated using the CKD-EPI Creatinine Equation (2021)    Anion gap 15 5 - 15    Comment: Performed at [facility], [address], [place], [place] [address]  Magnesium     Status: Abnormal   Collection Time: 12/06/22  4:04 AM  Result Value Ref Range   Magnesium 2.8 (H) 1.7 - 2.4 mg/dL  Comment: Performed at [facility], [address] [place], [place] [address]  CBC     Status: Abnormal   Collection Time: 12/06/22  4:04 AM  Result Value Ref Range   WBC 10.6 (H) 4.0 - 10.5 K/uL   RBC 2.85 (L) 3.87 - 5.11 MIL/uL   Hemoglobin 8.3 (L) 12.0 - 15.0 g/dL   HCT 32.2 (L) 02.5 - 42.7 %   MCV 89.1 80.0 - 100.0 fL   MCH 29.1 26.0 - 34.0 pg   MCHC 32.7 30.0 - 36.0 g/dL   RDW 06.2 37.6 - 28.3 %   Platelets 417 (H) 150 - 400 K/uL   nRBC 0.0 0.0 - 0.2 %    Comment: Performed at [facility], [address] [place], [place] [address]  Glucose, capillary     Status: Abnormal   Collection Time: 12/06/22  7:38 AM  Result Value Ref Range   Glucose-Capillary 168 (H) 70 - 99 mg/dL    Comment: Glucose reference range applies only to samples taken after fasting for at least 8 hours.  Body fluid cell count with differential     Status: Abnormal   Collection Time: 12/06/22  9:40 AM  Result Value Ref Range   Fluid  Type-FCT Pleural, L    Color, Fluid STRAW (A) YELLOW   Appearance, Fluid HAZY (A) CLEAR   Total Nucleated Cell Count, Fluid 588 0 - 1,000 cu mm   Neutrophil Count, Fluid 19 0 - 25 %   Lymphs, Fluid 79 %   Monocyte-Macrophage-Serous Fluid 2 (L) 50 - 90 %   Eos, Fluid 0 %   Other Cells, Fluid RARE MESOTHELIAL CELLS %    Comment: Performed at [facility], [address] [place], [place] [address]  Protein, pleural or peritoneal fluid     Status: None   Collection Time: 12/06/22  9:40 AM  Result Value Ref Range   Total protein, fluid <3.0 g/dL    Comment: (NOTE) No normal range established for this test Results should be evaluated in conjunction with serum values    Fluid Type-FTP Pleural, L     Comment: Performed at [facility], [address], [place], [place] [address]  Glucose, pleural or peritoneal fluid     Status: None   Collection Time: 12/06/22  9:40 AM  Result Value Ref Range   Glucose, Fluid 157 mg/dL    Comment: (NOTE) No normal range established for this test Results should be evaluated in conjunction with serum values    Fluid Type-FGLU Pleural, L     Comment: Performed at [facility], [address], [place], [place] [address]  Gram stain     Status: None   Collection Time: 12/06/22  9:40 AM   Specimen: Pleura  Result Value Ref Range   Specimen Description PLEURAL    Special Requests NONE    Gram Stain      CYTOSPIN SMEAR NO ORGANISMS SEEN WBC PRESENT,BOTH PMN AND MONONUCLEAR Performed at [facility], [address], [place], [place] [address]    Report Status 12/06/2022 FINAL   Culture, body fluid w Gram Stain-bottle     Status: None (Preliminary result)   Collection Time: 12/06/22  9:40 AM   Specimen: Pleura  Result Value Ref Range   Specimen Description PLEURAL    Special Requests 10CC BOTTLES DRAWN AEROBIC AND ANAEROBIC    Culture      NO GROWTH < 24 HOURS Performed at [facility], [address], [place], [place] [address]  Report Status PENDING    Glucose, capillary     Status: Abnormal   Collection Time: 12/06/22 11:11 AM  Result Value Ref Range   Glucose-Capillary 127 (H) 70 - 99 mg/dL    Comment: Glucose reference range applies only to samples taken after fasting for at least 8 hours.  Glucose, capillary     Status: Abnormal   Collection Time: 12/06/22  4:16 PM  Result Value Ref Range   Glucose-Capillary 127 (H) 70 - 99 mg/dL    Comment: Glucose reference range applies only to samples taken after fasting for at least 8 hours.  Glucose, capillary     Status: Abnormal   Collection Time: 12/06/22  8:40 PM  Result Value Ref Range   Glucose-Capillary 154 (H) 70 - 99 mg/dL    Comment: Glucose reference range applies only to samples taken after fasting for at least 8 hours.  Surgical PCR screen     Status: None   Collection Time: 12/07/22  3:07 AM   Specimen: Nasal Mucosa; Nasal Swab  Result Value Ref Range   MRSA, PCR NEGATIVE NEGATIVE   Staphylococcus aureus NEGATIVE NEGATIVE    Comment: (NOTE) The Xpert SA Assay (FDA approved for NASAL specimens in patients 54 years of age and older), is one component of a comprehensive surveillance program. It is not intended to diagnose infection nor to guide or monitor treatment. Performed at [facility], [address], [place], [place] [address]   Glucose, capillary     Status: Abnormal   Collection Time: 12/07/22  3:14 AM  Result Value Ref Range   Glucose-Capillary 54 (L) 70 - 99 mg/dL    Comment: Glucose reference range applies only to samples taken after fasting for at least 8 hours.  Glucose, capillary     Status: Abnormal   Collection Time: 12/07/22  3:56 AM  Result Value Ref Range   Glucose-Capillary 141 (H) 70 - 99 mg/dL    Comment: Glucose reference range applies only to samples taken after fasting for at least 8 hours.  Glucose, capillary     Status: Abnormal   Collection Time: 12/07/22  7:27 AM  Result Value Ref Range   Glucose-Capillary 101 (H) 70 - 99 mg/dL     Comment: Glucose reference range applies only to samples taken after fasting for at least 8 hours.  Glucose, capillary     Status: Abnormal   Collection Time: 12/07/22 10:28 AM  Result Value Ref Range   Glucose-Capillary 101 (H) 70 - 99 mg/dL    Comment: Glucose reference range applies only to samples taken after fasting for at least 8 hours.    US THORACENTESIS ASP PLEURAL SPACE W/IMG GUIDE  Result Date: 12/06/2022 INDICATION: Symptomatic LEFT sided pleural effusion EXAM: US THORACENTESIS ASP PLEURAL SPACE W/IMG GUIDE COMPARISON:  Chest XR, 12/04/2022. MEDICATIONS: None. COMPLICATIONS: None immediate. TECHNIQUE: Informed written consent was obtained from the patient and/or patient's representative after a discussion of the risks, benefits and alternatives to treatment. A timeout was performed prior to the initiation of the procedure. Initial ultrasound scanning demonstrates a small-to-moderate volume LEFT pleural effusion. The lower chest was prepped and draped in the usual sterile fashion. 1% lidocaine was used for local anesthesia. An ultrasound image was saved for documentation purposes. An 8 Fr Safe-T-Centesis catheter was introduced. The thoracentesis was performed. The catheter was removed and a dressing was applied. The patient tolerated the procedure well without immediate post procedural complication. The patient was escorted to have an upright chest  radiograph. FINDINGS: A total of approximately 700 mL of serous pleural fluid was removed. Requested samples were sent to the laboratory. IMPRESSION: 1. Successful ultrasound-guided diagnostic and therapeutic LEFT sided thoracentesis yielding 700 mL of pleural fluid. 2. A small volume pleural effusion is present on the contralateral RIGHT chest. Roanna Banning, MD [facility] Electronically Signed   By: Roanna Banning M.D.   On: 12/06/2022 13:05   DG Chest 1 View  Result Date:  12/06/2022 CLINICAL DATA:  Status post left thoracentesis. EXAM: CHEST  1 VIEW COMPARISON:  12/04/2022 FINDINGS: Stable cardiomediastinal contours. Aortic atherosclerosis. No significant pneumothorax identified status post left thoracentesis. Significant decreased volume of left pleural effusion with improved aeration to the right base. Unchanged small right effusion. IMPRESSION: 1. No significant pneumothorax identified status post left thoracentesis. 2. Significant decreased volume of left pleural effusion with improved aeration to the left base. Electronically Signed   By: Signa Kell M.D.   On: 12/06/2022 10:09   ECHOCARDIOGRAM COMPLETE  Result Date: 12/05/2022    ECHOCARDIOGRAM REPORT   Patient Name:   [name] [name] Date of Exam: 12/05/2022 Medical Rec #:  [id number]         Height:       62.0 in Accession #:    [id number]        Weight:       142.0 lb Date of Birth:  13-Dec-1940         BSA:          1.653 m Patient Age:    82 years          BP:           164/66 mmHg Patient Gender: F                 HR:           68 bpm. [facility] Procedure: 2D Echo, Cardiac Doppler, Color Doppler, 3D Echo and Strain Analysis Indications:    CHF-Acute Diastolic I50.31  History:        Patient has prior history of Echocardiogram examinations, most                 recent 12/02/2021. CHF, Pulmonary HTN; Risk Factors:Hypertension,                 Dyslipidemia, Diabetes and Non-Smoker.  Sonographer:    Aron Baba Referring Phys: [id number] PRATIK D [facility]  Sonographer Comments: Image acquisition challenging due to respiratory motion. Global longitudinal strain was attempted. IMPRESSIONS  1. Left ventricular ejection fraction, by estimation, is 35%. The left ventricle has moderately decreased function. The left ventricle demonstrates regional wall motion abnormalities (see scoring diagram/findings for description). There is mild left ventricular hypertrophy. Left ventricular diastolic parameters are consistent  with Grade II diastolic dysfunction (pseudonormalization). Elevated left atrial pressure.  2. Right ventricular systolic function is normal. The right ventricular size is normal. There is severely elevated pulmonary artery systolic pressure.  3. Left atrial size was moderately dilated.  4. Large pleural effusion in the left lateral region.  5. The mitral valve is abnormal. Moderate mitral valve regurgitation. Mild mitral stenosis.  6. The tricuspid valve is abnormal. Tricuspid valve regurgitation is moderate.  7. The aortic valve is tricuspid. There is mild calcification of the aortic valve. There is mild thickening of the aortic valve. Aortic valve regurgitation is not visualized. No aortic stenosis is present.  8. The inferior vena cava is  normal in size with <50% respiratory variability, suggesting right atrial pressure of 8 mmHg. FINDINGS  Left Ventricle: Left ventricular ejection fraction, by estimation, is 35%. The left ventricle has moderately decreased function. The left ventricle demonstrates regional wall motion abnormalities. The left ventricular internal cavity size was normal in size. There is mild left ventricular hypertrophy. Left ventricular diastolic parameters are consistent with Grade II diastolic dysfunction (pseudonormalization). Elevated left atrial pressure.  LV Wall Scoring: The apical septal segment and apex are akinetic. The anterior septum, mid inferoseptal segment, and basal inferoseptal segment are hypokinetic. Right Ventricle: The right ventricular size is normal. Right vetricular wall thickness was not well visualized. Right ventricular systolic function is normal. There is severely elevated pulmonary artery systolic pressure. The tricuspid regurgitant velocity is 3.66 m/s, and with an assumed right atrial pressure of 8 mmHg, the estimated right ventricular systolic pressure is 61.6 mmHg. Left Atrium: Left atrial size was moderately dilated. Right Atrium: Right atrial size was normal  in size. Pericardium: There is no evidence of pericardial effusion. Mitral Valve: The mitral valve is abnormal. There is mild thickening of the mitral valve leaflet(s). There is mild calcification of the mitral valve leaflet(s). Mild mitral annular calcification. Moderate mitral valve regurgitation. Mild mitral valve stenosis. The mean mitral valve gradient is 3.0 mmHg. Tricuspid Valve: The tricuspid valve is abnormal. Tricuspid valve regurgitation is moderate . No evidence of tricuspid stenosis. Aortic Valve: The aortic valve is tricuspid. There is mild calcification of the aortic valve. There is mild thickening of the aortic valve. There is mild aortic valve annular calcification. Aortic valve regurgitation is not visualized. No aortic stenosis  is present. Aortic valve mean gradient measures 5.7 mmHg. Aortic valve peak gradient measures 12.3 mmHg. Aortic valve area, by VTI measures 1.41 cm. Pulmonic Valve: The pulmonic valve was not well visualized. Pulmonic valve regurgitation is mild. No evidence of pulmonic stenosis. Aorta: The aortic root is normal in size and structure. Venous: The inferior vena cava is normal in size with less than 50% respiratory variability, suggesting right atrial pressure of 8 mmHg. IAS/Shunts: No atrial level shunt detected by color flow Doppler. Additional Comments: There is a large pleural effusion in the left lateral region.  LEFT VENTRICLE PLAX 2D LVIDd:         4.30 cm     Diastology LVIDs:         3.60 cm     LV e' medial:    3.77 cm/s LV PW:         1.20 cm     LV E/e' medial:  45.6 LV IVS:        1.10 cm     LV e' lateral:   7.89 cm/s LVOT diam:     1.80 cm     LV E/e' lateral: 21.8 LV SV:         49 LV SV Index:   30 LVOT Area:     2.54 cm                             3D Volume EF: LV Volumes (MOD)           3D EF:        49 % LV vol d, MOD A2C: 90.4 ml LV EDV:       142 ml LV vol d, MOD A4C: 87.1 ml LV ESV:       72 ml LV vol s,  MOD A2C: 50.8 ml LV SV:        70 ml LV vol s,  MOD A4C: 42.1 ml LV SV MOD A2C:     39.6 ml LV SV MOD A4C:     87.1 ml LV SV MOD BP:      43.3 ml RIGHT VENTRICLE RV S prime:     12.70 cm/s TAPSE (M-mode): 1.7 cm LEFT ATRIUM             Index        RIGHT ATRIUM           Index LA diam:        4.00 cm 2.42 cm/m   RA Area:     13.10 cm LA Vol (A2C):   89.5 ml 54.15 ml/m  RA Volume:   33.00 ml  19.97 ml/m LA Vol (A4C):   59.7 ml 36.12 ml/m LA Biplane Vol: 74.9 ml 45.32 ml/m  AORTIC VALVE                     PULMONIC VALVE AV Area (Vmax):    1.42 cm      PR End Diast Vel: 7.51 msec AV Area (Vmean):   1.51 cm AV Area (VTI):     1.41 cm AV Vmax:           175.57 cm/s AV Vmean:          110.256 cm/s AV VTI:            0.346 m AV Peak Grad:      12.3 mmHg AV Mean Grad:      5.7 mmHg LVOT Vmax:         98.20 cm/s LVOT Vmean:        65.600 cm/s LVOT VTI:          0.192 m LVOT/AV VTI ratio: 0.55  AORTA Ao Root diam: 3.20 cm Ao Asc diam:  3.00 cm MITRAL VALVE                TRICUSPID VALVE MV Area (PHT): 2.96 cm     TR Peak grad:   53.6 mmHg MV Mean grad:  3.0 mmHg     TR Vmax:        366.00 cm/s MV Decel Time: 256 msec MR Peak grad: 107.5 mmHg    SHUNTS MR Mean grad: 80.0 mmHg     Systemic VTI:  0.19 m MR Vmax:      518.50 cm/s   Systemic Diam: 1.80 cm MR Vmean:     428.0 cm/s MR PISA:      1.57 cm MV E velocity: 172.00 cm/s MV A velocity: 132.00 cm/s MV E/A ratio:  1.30 Dina Rich MD Electronically signed by Dina Rich MD Signature Date/Time: 12/05/2022/3:48:21 PM    Final    US RENAL  Result Date: 12/05/2022 CLINICAL DATA:  Acute renal insufficiency EXAM: RENAL / URINARY TRACT ULTRASOUND COMPLETE COMPARISON:  05/19/2020 FINDINGS: Right Kidney: Renal measurements: 10.8 x 6.1 x 4.2 cm = volume: 146.5 ML. Echogenicity within normal limits. No mass or hydronephrosis visualized. Left Kidney: Renal measurements: 9.4 x 4.9 x 3.9 cm = volume: 94.0 mL. Echogenicity within normal limits. No mass or hydronephrosis visualized. Bladder: Appears normal for  degree of bladder distention. Other: Bilateral pleural effusions are incidentally noted, left greater than right. IMPRESSION: 1. Incidental bilateral pleural effusions, left greater than right. 2. Otherwise unremarkable renal ultrasound. Electronically Signed   By: Sharlet Salina  M.D.   On: 12/05/2022 14:50     Assessment & Plan:  ARLOW GERLEMAN is a 82 y.o. female with worsening renal failure needing dialysis. Nephrology requested a tunneled catheter. Discussed with patient and her POA [name] and discussed risk of bleeding, infection, injury to vessels, pneumothorax, and malfunction. Discussed increased risk of bleeding with the eliquis. Discussed [place] and fluoroscopy guidance.   All questions were answered to the satisfaction of the patient and family.  Doxycycline is running right now and should cover things we worry about for the catheter placement. Discussed with pharmacy and they agree.   Lucretia Roers 12/07/2022, 10:40 AM

## 2022-12-07 NOTE — Progress Notes (Signed)
Palliative: Chart review completed.  [name] [name] was off the floor for tunneled HD cath placement.  No family in the room. Face-to-face conference with nephrologist, transition of care team.  Plan: At this point continue full scope/full code.  To start hemodialysis.  Transition of care team to work closely with patient and family for disposition, HD chair.  PMT to continue to follow.  No charge [name] Carmel, NP Palliative medicine team Team phone [phone] Greater than 50% of this time was spent counseling and coordinating care related to the above assessment and plan.

## 2022-12-07 NOTE — Transfer of Care (Signed)
Immediate Anesthesia Transfer of Care Note  Patient: [name] [name]  Procedure(s) Performed: INSERTION OF DIALYSIS CATHETER  Patient Location: PACU  Anesthesia Type:MAC  Level of Consciousness: awake  Airway & Oxygen Therapy: Patient Spontanous Breathing and Patient connected to nasal cannula oxygen  Post-op Assessment: Report given to RN and Post -op Vital signs reviewed and stable  Post vital signs: Reviewed and stable  Last Vitals:  Vitals Value Taken Time  BP 179/66 12/07/22 1224  Temp 97.7   Pulse 70 12/07/22 1225  Resp 14   SpO2 99 % 12/07/22 1225  Vitals shown include unfiled device data.  Last Pain:  Vitals:   12/07/22 1033  TempSrc: Oral  PainSc:       Patients Stated Pain Goal: 0 (12/05/22 1113)  Complications: No notable events documented.

## 2022-12-07 NOTE — Consult Note (Signed)
WOC Nurse Consult Note: Reason for Consult: cellulitis Patient was admitted 8/26 with SOB. Had been in the ED 12/02/22 for leg wound. She was treated with doxycycline.  Staff not able to obtain images or use telehealth cart for consultation 8/29.  Re-evaluation this am for images. Hospitalist noted in rounds that wound has not changed significantly since image taken 8/24, will utilize this image for topical care recommendation  Wound type: trauma, hit of truck running board one week from 8/24 image  Pressure Injury POA: NA Measurement:see nursing flow sheets Wound ZOX:WRUEA with 20% yellow  Drainage (amount, consistency, odor) none documented Periwound: intact  Dressing procedure/placement/frequency: Cleanse with Vashe, pat dry Apply single layer of xeroform gauze Top with foam dressing.  Change every other day.   FU with WCC of patient's choice for healing of this wound.  No vascular history that I can find. Consider vascular follow up outpatient if failure to heal with conservative topical tx recommended above.    Re consult if needed, will not follow at this time. Thanks  Treyson Axel [employer], RN,CWOCN, CNS, CWON-AP [phone])

## 2022-12-07 NOTE — Plan of Care (Signed)
  Problem: Clinical Measurements: Goal: Diagnostic test results will improve Outcome: Progressing   Problem: Nutrition: Goal: Adequate nutrition will be maintained Outcome: Progressing    Problem: Safety: Goal: Ability to remain free from injury will improve Outcome: Progressing   

## 2022-12-07 NOTE — Progress Notes (Signed)
Patient CBG 54 at 3am check. NPO for scheduled catheter placement . Hypoglycemic protocol initiated. Dextrose given via IV; CBG rechecked at 141.

## 2022-12-07 NOTE — Anesthesia Preprocedure Evaluation (Signed)
Anesthesia Evaluation  Patient identified by MRN, date of birth, ID band Patient awake    Reviewed: Allergy & Precautions, H&P , NPO status , Patient's Chart, lab work & pertinent test results, reviewed documented beta blocker date and time   Airway Mallampati: II  TM Distance: >3 FB Neck ROM: full    Dental no notable dental hx.    Pulmonary neg pulmonary ROS   Pulmonary exam normal breath sounds clear to auscultation       Cardiovascular Exercise Tolerance: Good hypertension, +CHF  negative cardio ROS  Rhythm:regular Rate:Normal     Neuro/Psych  Neuromuscular disease negative neurological ROS  negative psych ROS   GI/Hepatic negative GI ROS, Neg liver ROS,,,  Endo/Other  negative endocrine ROSdiabetes    Renal/GU Dialysis and ESRFRenal diseasenegative Renal ROS  negative genitourinary   Musculoskeletal   Abdominal   Peds  Hematology negative hematology ROS (+) Blood dyscrasia, anemia   Anesthesia Other Findings   Reproductive/Obstetrics negative OB ROS                             Anesthesia Physical Anesthesia Plan  ASA: 4  Anesthesia Plan: MAC   Post-op Pain Management:    Induction:   PONV Risk Score and Plan:   Airway Management Planned:   Additional Equipment:   Intra-op Plan:   Post-operative Plan:   Informed Consent: I have reviewed the patients History and Physical, chart, labs and discussed the procedure including the risks, benefits and alternatives for the proposed anesthesia with the patient or authorized representative who has indicated his/her understanding and acceptance.     Dental Advisory Given  Plan Discussed with: CRNA  Anesthesia Plan Comments:        Anesthesia Quick Evaluation

## 2022-12-07 NOTE — Progress Notes (Addendum)
[facility]   Updated family and team. Diet ordered. Updated dialysis RN. CXR ordered and reviewed, looks in good position and no PTX. Official read pending.   Algis Greenhouse, MD [facility] [address] [place], [place] [address] [phone] (office)

## 2022-12-07 NOTE — Progress Notes (Signed)
[facility]  Final chest x-ray, read with Line in good position and no pneumothorax.  Algis Greenhouse, MD

## 2022-12-07 NOTE — Op Note (Signed)
Operative Note 12/07/22   Preoperative Diagnosis: End Stage Renal Disease    Postoperative Diagnosis: Same   Procedure(s) Performed: Tunneled Dialysis Catheter Placement, Right Internal Jugular    Surgeon: Leatrice Jewels. Henreitta Leber, MD   Assistants: No qualified resident was available   Anesthesia: Monitored anesthesia care   Anesthesiologist: Windell Norfolk, MD    Specimens: None   Estimated Blood Loss: Minimal   Fluoroscopy time: 7 seconds   Blood Replacement: None    Complications: None    Operative Findings: Dilated internal jugular and external jugular   Indications: Ms. Lindle has worsening renal disease and needs to start dialysis per nephrology. They requested a tunneled dialysis catheter.   Procedure: The patient was brought into the operating room and monitored anesthesia was induced.   The right chest and neck was prepped and draped in the usual sterile fashion.  Preoperative antibiotics were given.   An Ultrasound was used to verify that the right internal jugular vein was patent.  One percent lidocaine was used for local anesthesia.  The patient was measured and a 19 cm Palindrome dual lumen dialysis catheter.  The needles advanced into the right internal jugular vein but the wire would not thread so I had to pull back and reposition.  After doing this the wire was advanced into the right internal jugular using the Seldinger technique.  A guidewire was then advanced into the right atrium under fluoroscopic guidance.  Ectopia was noted and the wire was pulled back.  The wire was secured.  An incision was made over the right chest and the catheter was tunneled to the neck.  The ultrasound again confirmed the wire was going into the vein only. Dilators were used over the wire to dilate the track.  An introducer and peel-away sheath were placed over the guidewire. The catheter was then inserted through the peel-away sheath and the peel-away sheath was removed.  A spot film was  performed to confirm the position.  The catheter drew back and flushed easily. The lumens were packed with heparin. Hemostats were used to position the catheter in the neck incision. The neck incision was closed with 4-0 Monocryl and Dermabond. The catheter was secured with 2-0 silk suture and a sterile Biopatch and dressing was applied.  Hemostasis was confirmed.     All tape and needle counts were correct at the end of the procedure. The patient was transferred to PACU in stable condition. A chest x-ray will be performed at that time.  Algis Greenhouse, MD [facility] [address] [place], [place] [address] [phone] (office)

## 2022-12-07 NOTE — Inpatient Diabetes Management (Addendum)
Inpatient Diabetes Program Recommendations  AACE/ADA: New Consensus Statement on Inpatient Glycemic Control (2015)  Target Ranges:  Prepandial:   less than 140 mg/dL      Peak postprandial:   less than 180 mg/dL (1-2 hours)      Critically ill patients:  140 - 180 mg/dL    Latest Reference Range & Units 12/07/22 03:14 12/07/22 03:56 12/07/22 07:27  Glucose-Capillary 70 - 99 mg/dL 54 (L) 161 (H) 096 (H)    Latest Reference Range & Units 12/06/22 07:38 12/06/22 11:11 12/06/22 16:16 12/06/22 20:40  Glucose-Capillary 70 - 99 mg/dL 045 (H) 409 (H) 811 (H) 154 (H)   Review of Glycemic Control  Diabetes history: DM2 Outpatient Diabetes medications: OmniPod insulin pump with Humalog, Libre CGM, Ozempic 1 mg Qweek Current orders for Inpatient glycemic control: Insulin Pump AC&HS and 2am   NOTE: Noted glucose down to 54 mg/dl at 9:14 am today which was treated with D50 50 ml since patient is NPO for dialysis catheter placement today. Called patient's daughter Lamar Laundry to discuss glycemic control. Lamar Laundry reports that patient is to have dialysis catheter placed today and plan is to start dialysis today. Discussed that given she was hypoglycemic this morning with plans to start dialysis, the insulin pump settings may need to be decreased. Encouraged [name] to reach out to patient's Endocrinology office to ask if they would recommend to decrease insulin pump settings. Lamar Laundry states she will call their office this morning and will reach back out to diabetes coordinator if they recommend any changes so team can assist with making the basal rate changes.  Addendum 12/07/22@15 :00-Spoke with Lamar Laundry (patient's daughter) over the phone. She reports that she spoke with Carollee Herter, RD from Endocrinology office and they feel like it will be fine to continue insulin pump settings are they currently are. Discussed that glucose was 78 mg/dl at 78:29 today and once patient starts dialysis will need follow glucose trends closely.  Discussed that insulin needs may change with dialysis and may need to decrease basal settings especially if patient has any other lows at all. Patient uses the manual mode on her OmniPod insulin pump so the pump delivers insulin hourly as programmed in settings. Lamar Laundry states she will follow it closely and reach back out to [employer], RD tomorrow if needed. Informed [name] inpatient diabetes team will continue to follow along while inpatient. [name] verbalized understanding and has no questions at this time.  Thanks, [name], RN, MSN, CDCES Diabetes Coordinator Inpatient Diabetes Program [phone] (Team Pager from 8am to 5pm)

## 2022-12-07 NOTE — Progress Notes (Signed)
PROGRESS NOTE    [name]  OZH:[id number] DOB: 05-15-1940 DOA: 12/04/2022 PCP: Dorothyann Peng, MD   Brief Narrative:    [name] [name] is a 82 y.o. female with medical history significant for HFpEF, type 2 diabetes insulin-dependent, CKD stage IV, hypertension, dyslipidemia, chronic anemia of CKD, and prior CVA who presented to the ED with worsening shortness of breath as well as dry cough that began last night.  Patient was admitted with acute hypoxemic respiratory failure-multifactorial in the setting of volume overload as well as suspected pneumonia.  She is also noted to have worsening AKI on CKD stage IV and nephrology recommending hemodialysis, family now appears agreeable.  She is noted to have pleural effusion and will undergo thoracentesis today.  Anticipate hemodialysis initiation in the next 1-2 days.  Assessment & Plan:   Principal Problem:   Acute hypoxemic respiratory failure (HCC) Active Problems:   Chronic kidney disease (CKD), stage IV (severe) (HCC)   Essential hypertension   Mixed hyperlipidemia   Anemia due to chronic kidney disease   Diabetic retinopathy associated with type 2 diabetes mellitus (HCC)   Wound of left leg   End stage renal disease (HCC)  Assessment and Plan:  Acute hypoxemic respiratory failure-likely related to possible pneumonia -Off BiPAP; status post thoracentesis. -Left lower lobe atelectasis versus effusion with possible pneumonia. -Continue Rocephin and doxycycline for now and check procalcitonin -Scheduled breathing treatments, flutter valve and incentive spirometry has been encouraged. -Continue to wean off oxygen supplementation as needed.   AKI on CKD stage IV to V with metabolic acidosis Poor oral intake over the last 1 week noted Appreciate nephrology recommendations with plans for bicarb infusion through today Family now appears agreeable for hemodialysis and this will be initiated per nephrology when  appropriate -Status post dialysis catheter placement; will initiate back-to-back treatments (8/29>>8/30>>8/31); social worker aware to assist with outpatient dialysis chair.  Patient will have Sunday of with intention for fourth dialysis treatment on Monday -Continue to closely follow daily weights, volume status and response. -Continue to follow nephrology service recommendation; appreciate their assistance in care.   Left lower extremity cellulitis/wound -Continue to follow wound care as recommended by wound care service.   -Continue current antibiotics with Rocephin and doxycycline DVT evaluation negative   Elevated BNP -Does not appear to be related to acute CHF -Likely elevated in the setting of AKI on CKD stage IV-V patient a transition point currently based on GFR. -Continue to follow daily weights and strict I's and O's.   Anemia of CKD Receives Retacrit injections outpatient with last dose 8/5 -No overt bleeding appreciated. Continue to monitor with repeat CBC, no overt bleeding noted Transfuse for hemoglobin less than 7 Dose of Aranesp 60 mcg per nephrology -Further treatment with Epogen as per nephrology discretion. -Follow hemoglobin trend.   DM2 with mild hyperglycemia -Started on carb modified diet -Continue to follow CBGs fluctuation and adjust hypoglycemic regimen as needed.   Hypertension -Continue home antihypertensive agents -Continue as needed hydralazine -Follow vital signs -Anticipate improvement with dialysis -Continue to follow vital signs.   Dyslipidemia -Continue atorvastatin -Heart healthy diet discussed with patient.   PAF -Eliquis on hold until dialysis catheter placed 12/07/2022; anticipate ability to resume anticoagulation after. -Continue metoprolol -Normal sinus rhythm per telemetry evaluation. -Continue to follow electrolytes.   HFpEF Continue metoprolol Repeat 2D echocardiogram with previous 03/2021 with LVEF 65-70% with grade 1  diastolic dysfunction and moderate LVH Noted to have elevated BNP, but more likely related to  significant renal dysfunction. -At this moment after discussing with nephrologist and following further deterioration in her renal function plan is to initiate dialysis -Dialysis catheter has been placed and ready to use (appreciate assistance and care by Dr. Henreitta Leber).   DVT prophylaxis: Eliquis to be resumed once safe after dialysis catheter in place.  Not on SCDs given left lower extremity wound. Code Status: Full Family Communication: Daughter at bedside 8/28 Disposition Plan:  Status is: Inpatient  Remains inpatient appropriate because: Need for IV antibiotics and inpatient procedures.   Consultants:  Nephrology Palliative care  Procedures:  Ultrasound thoracentesis pending 8/27  Antimicrobials:  Anti-infectives (From admission, onward)    Start     Dose/Rate Route Frequency Ordered Stop   12/07/22 1045  ceFAZolin (ANCEF) IVPB 2g/100 mL premix  Status:  Discontinued        2 g 200 mL/hr over 30 Minutes Intravenous On call to O.R. 12/07/22 7829 12/07/22 1333   12/05/22 2200  doxycycline (VIBRAMYCIN) 100 mg in sodium chloride 0.9 % 250 mL IVPB        100 mg 125 mL/hr over 120 Minutes Intravenous Every 12 hours 12/04/22 1526     12/05/22 1500  cefTRIAXone (ROCEPHIN) 1 g in sodium chloride 0.9 % 100 mL IVPB        1 g 200 mL/hr over 30 Minutes Intravenous Every 24 hours 12/04/22 1526     12/04/22 1345  cefTRIAXone (ROCEPHIN) 1 g in sodium chloride 0.9 % 100 mL IVPB        1 g 200 mL/hr over 30 Minutes Intravenous  Once 12/04/22 1335 12/04/22 1537   12/04/22 1345  doxycycline (VIBRA-TABS) tablet 100 mg        100 mg Oral  Once 12/04/22 1335 12/04/22 1507      Subjective: Overall improved respiratory status; no fever, no chest pain.  Status post dialysis catheter placement.  3 L nasal supplementation in place.  Objective: Vitals:   12/07/22 0821 12/07/22 1033 12/07/22 1226  12/07/22 1328  BP:  (!) 159/76 (!) 179/66 (!) 193/66  Pulse:  74 70 69  Resp:  14 12 12   Temp:  98.1 F (36.7 C) 97.7 F (36.5 C) 98.1 F (36.7 C)  TempSrc:  Oral  Oral  SpO2: 98% 100% 99% 98%  Weight:  64.4 kg    Height:  5\' 2"  (1.575 m)      Intake/Output Summary (Last 24 hours) at 12/07/2022 1802 Last data filed at 12/07/2022 1219 Gross per 24 hour  Intake 490 ml  Output 5 ml  Net 485 ml   Filed Weights   12/04/22 1224 12/07/22 1033  Weight: 64.4 kg 64.4 kg    Examination: General exam: Somnolent after dialysis catheter placement; afebrile, no chest pain, no nausea vomiting.  In no acute distress. Respiratory system: Fair air movement bilaterally; positive rhonchi, decreased breath sounds at the bases.  No using accessory muscle.  Good saturation on 3 L nasal cannula supplementation. Cardiovascular system: Rate controlled, no rubs, no gallops, no JVD.  Sinus rhythm per telemetry evaluation. Gastrointestinal system: Abdomen is obese, nondistended, soft and nontender. No organomegaly or masses felt. Normal bowel sounds heard. Central nervous system: Alert and oriented. No focal neurological deficits. Extremities: No cyanosis or clubbing.  Trace edema appreciated bilaterally. Skin: No petechiae.  Left lower extremity with less erythematous changes appreciated and cellulitic process surrounding open wound in her anterior leg.  There is a scab without active drainage appreciated. Psychiatry: Judgement and insight appear  normal.  Flat affect.   Data Reviewed: I have personally reviewed following labs and imaging studies  CBC: Recent Labs  Lab 12/04/22 1225 12/05/22 0427 12/06/22 0404  WBC 9.8 10.1 10.6*  HGB 9.3* 8.6* 8.3*  HCT 29.1* 26.1* 25.4*  MCV 89.5 89.1 89.1  PLT 460* 419* 417*   Basic Metabolic Panel: Recent Labs  Lab 12/04/22 1225 12/05/22 0427 12/06/22 0404  NA 135 135 136  K 4.2 4.0 3.5  CL 107 108 108  CO2 11* 12* 13*  GLUCOSE 150* 106* 140*  BUN  105* 97* 98*  CREATININE 10.03* 9.97* 9.81*  CALCIUM 8.7* 8.7* 8.3*  MG 3.2* 3.0* 2.8*   GFR: Estimated Creatinine Clearance: 3.8 mL/min (A) (by C-G formula based on SCr of 9.81 mg/dL (H)).  CBG: Recent Labs  Lab 12/07/22 0356 12/07/22 0727 12/07/22 1028 12/07/22 1340 12/07/22 1631  GLUCAP 141* 101* 101* 78 147*   Sepsis Labs: Recent Labs  Lab 12/05/22 1418  PROCALCITON 0.35    Recent Results (from the past 240 hour(s))  Resp panel by RT-PCR (RSV, Flu A&B, Covid) Anterior Nasal Swab     Status: None   Collection Time: 12/04/22 12:26 PM   Specimen: Anterior Nasal Swab  Result Value Ref Range Status   SARS Coronavirus 2 by RT PCR NEGATIVE NEGATIVE Final    Comment: (NOTE) SARS-CoV-2 target nucleic acids are NOT DETECTED.  The SARS-CoV-2 RNA is generally detectable in upper respiratory specimens during the acute phase of infection. The lowest concentration of SARS-CoV-2 viral copies this assay can detect is 138 copies/mL. A negative result does not preclude SARS-Cov-2 infection and should not be used as the sole basis for treatment or other patient management decisions. A negative result may occur with  improper specimen collection/handling, submission of specimen other than nasopharyngeal swab, presence of viral mutation(s) within the areas targeted by this assay, and inadequate number of viral copies(<138 copies/mL). A negative result must be combined with clinical observations, patient history, and epidemiological information. The expected result is Negative.  Fact Sheet for Patients:  BloggerCourse.com  Fact Sheet for Healthcare Providers:  SeriousBroker.it  This test is no t yet approved or cleared by the [place] FDA and  has been authorized for detection and/or diagnosis of SARS-CoV-2 by FDA under an Emergency Use Authorization (EUA). This EUA will remain  in effect (meaning this test can be used) for  the duration of the COVID-19 declaration under Section 564(b)(1) of the Act, 21 U.S.C.section 360bbb-3(b)(1), unless the authorization is terminated  or revoked sooner.       Influenza A by PCR NEGATIVE NEGATIVE Final   Influenza B by PCR NEGATIVE NEGATIVE Final    Comment: (NOTE) The Xpert Xpress SARS-CoV-2/FLU/RSV plus assay is intended as an aid in the diagnosis of influenza from Nasopharyngeal swab specimens and should not be used as a sole basis for treatment. Nasal washings and aspirates are unacceptable for Xpert Xpress SARS-CoV-2/FLU/RSV testing.  Fact Sheet for Patients: BloggerCourse.com  Fact Sheet for Healthcare Providers: SeriousBroker.it  This test is not yet approved or cleared by the [place] FDA and has been authorized for detection and/or diagnosis of SARS-CoV-2 by FDA under an Emergency Use Authorization (EUA). This EUA will remain in effect (meaning this test can be used) for the duration of the COVID-19 declaration under Section 564(b)(1) of the Act, 21 U.S.C. section 360bbb-3(b)(1), unless the authorization is terminated or revoked.     Resp Syncytial Virus by PCR NEGATIVE NEGATIVE Final  Comment: (NOTE) Fact Sheet for Patients: BloggerCourse.com  Fact Sheet for Healthcare Providers: SeriousBroker.it  This test is not yet approved or cleared by the [place] FDA and has been authorized for detection and/or diagnosis of SARS-CoV-2 by FDA under an Emergency Use Authorization (EUA). This EUA will remain in effect (meaning this test can be used) for the duration of the COVID-19 declaration under Section 564(b)(1) of the Act, 21 U.S.C. section 360bbb-3(b)(1), unless the authorization is terminated or revoked.  Performed at [facility], [address] [place], [place] [address]   Gram stain     Status: None   Collection Time: 12/06/22  9:40 AM    Specimen: Pleura  Result Value Ref Range Status   Specimen Description PLEURAL  Final   Special Requests NONE  Final   Gram Stain   Final    CYTOSPIN SMEAR NO ORGANISMS SEEN WBC PRESENT,BOTH PMN AND MONONUCLEAR Performed at [facility], [address], [place], [place] [address]    Report Status 12/06/2022 FINAL  Final  Culture, body fluid w Gram Stain-bottle     Status: None (Preliminary result)   Collection Time: 12/06/22  9:40 AM   Specimen: Pleura  Result Value Ref Range Status   Specimen Description PLEURAL  Final   Special Requests 10CC BOTTLES DRAWN AEROBIC AND ANAEROBIC  Final   Culture   Final    NO GROWTH < 24 HOURS Performed at [facility], [address] [place], [place] [address]    Report Status PENDING  Incomplete  Surgical PCR screen     Status: None   Collection Time: 12/07/22  3:07 AM   Specimen: Nasal Mucosa; Nasal Swab  Result Value Ref Range Status   MRSA, PCR NEGATIVE NEGATIVE Final   Staphylococcus aureus NEGATIVE NEGATIVE Final    Comment: (NOTE) The Xpert SA Assay (FDA approved for NASAL specimens in patients 28 years of age and older), is one component of a comprehensive surveillance program. It is not intended to diagnose infection nor to guide or monitor treatment. Performed at [facility], [address], [place], [place] [address]     Radiology Studies: [facility] Chest [facility] 1 View  Result Date: 12/07/2022 CLINICAL DATA:  Dialysis catheter placement. EXAM: PORTABLE CHEST 1 VIEW COMPARISON:  12/06/2022 FINDINGS: Right IJ central line tip overlies the distal SVC level. No evidence for pneumothorax. Diffuse vascular congestion with interstitial pulmonary edema, progressive in the interval. Bibasilar collapse/consolidation with small right and tiny left pleural effusions. The cardio pericardial silhouette is enlarged. IMPRESSION: 1. Right IJ central line tip overlies the distal SVC level. No pneumothorax. 2. Progressive vascular congestion and interstitial  pulmonary edema. 3. Bibasilar collapse/consolidation with small right and tiny left pleural effusions. Electronically Signed   By: Kennith Center M.D.   On: 12/07/2022 13:38   DG C-Arm 1-60 Min-No Report  Result Date: 12/07/2022 Fluoroscopy was utilized by the requesting physician.  No radiographic interpretation.   US THORACENTESIS ASP PLEURAL SPACE W/IMG GUIDE  Result Date: 12/06/2022 INDICATION: Symptomatic LEFT sided pleural effusion EXAM: US THORACENTESIS ASP PLEURAL SPACE W/IMG GUIDE COMPARISON:  Chest XR, 12/04/2022. MEDICATIONS: None. COMPLICATIONS: None immediate. TECHNIQUE: Informed written consent was obtained from the patient and/or patient's representative after a discussion of the risks, benefits and alternatives to treatment. A timeout was performed prior to the initiation of the procedure. Initial ultrasound scanning demonstrates a small-to-moderate volume LEFT pleural effusion. The lower chest was prepped and draped in the usual sterile fashion. 1% lidocaine was used for  local anesthesia. An ultrasound image was saved for documentation purposes. An 8 Fr Safe-T-Centesis catheter was introduced. The thoracentesis was performed. The catheter was removed and a dressing was applied. The patient tolerated the procedure well without immediate post procedural complication. The patient was escorted to have an upright chest radiograph. FINDINGS: A total of approximately 700 mL of serous pleural fluid was removed. Requested samples were sent to the laboratory. IMPRESSION: 1. Successful ultrasound-guided diagnostic and therapeutic LEFT sided thoracentesis yielding 700 mL of pleural fluid. 2. A small volume pleural effusion is present on the contralateral RIGHT chest. Roanna Banning, MD [facility] Radiology Electronically Signed   By: Roanna Banning M.D.   On: 12/06/2022 13:05   DG Chest 1 View  Result Date: 12/06/2022 CLINICAL DATA:  Status post left  thoracentesis. EXAM: CHEST  1 VIEW COMPARISON:  12/04/2022 FINDINGS: Stable cardiomediastinal contours. Aortic atherosclerosis. No significant pneumothorax identified status post left thoracentesis. Significant decreased volume of left pleural effusion with improved aeration to the right base. Unchanged small right effusion. IMPRESSION: 1. No significant pneumothorax identified status post left thoracentesis. 2. Significant decreased volume of left pleural effusion with improved aeration to the left base. Electronically Signed   By: Signa Kell M.D.   On: 12/06/2022 10:09    Scheduled Meds:  amLODipine  5 mg Oral Daily   atorvastatin  20 mg Oral Daily   budesonide (PULMICORT) nebulizer solution  0.25 mg Nebulization BID   chlorhexidine       Chlorhexidine Gluconate Cloth  6 each Topical Q0600   famotidine  20 mg Oral QHS   fluticasone  1 spray Each Nare Daily   hydrALAZINE  25 mg Oral TID   insulin pump   Subcutaneous TID WC, HS, 0200   isosorbide mononitrate  60 mg Oral Daily   latanoprost  1 drop Both Eyes QHS   loratadine  10 mg Oral Daily   metoprolol succinate  12.5 mg Oral Daily   mupirocin ointment  1 Application Nasal BID   Continuous Infusions:  cefTRIAXone (ROCEPHIN)  IV 1 g (12/07/22 1712)   doxycycline (VIBRAMYCIN) IV Stopped (12/07/22 1422)   sodium bicarbonate 150 mEq in dextrose 5 % 1,150 mL infusion 75 mL/hr at 12/07/22 0023     LOS: 3 days   Time spent: 45 minutes  Lakota Schweppe,MD [facility]  If 7PM-7AM, please contact night-coverage [email or web address] 12/07/2022, 6:02 PM

## 2022-12-08 DIAGNOSIS — J189 Pneumonia, unspecified organism: Secondary | ICD-10-CM | POA: Diagnosis not present

## 2022-12-08 DIAGNOSIS — S81802D Unspecified open wound, left lower leg, subsequent encounter: Secondary | ICD-10-CM | POA: Diagnosis not present

## 2022-12-08 DIAGNOSIS — J9601 Acute respiratory failure with hypoxia: Secondary | ICD-10-CM | POA: Diagnosis not present

## 2022-12-08 DIAGNOSIS — E782 Mixed hyperlipidemia: Secondary | ICD-10-CM | POA: Diagnosis not present

## 2022-12-08 LAB — RENAL FUNCTION PANEL
Albumin: 2.3 g/dL — ABNORMAL LOW (ref 3.5–5.0)
Anion gap: 8 (ref 5–15)
BUN: 59 mg/dL — ABNORMAL HIGH (ref 8–23)
CO2: 26 mmol/L (ref 22–32)
Calcium: 7.7 mg/dL — ABNORMAL LOW (ref 8.9–10.3)
Chloride: 103 mmol/L (ref 98–111)
Creatinine, Ser: 6.38 mg/dL — ABNORMAL HIGH (ref 0.44–1.00)
GFR, Estimated: 6 mL/min — ABNORMAL LOW (ref >60)
Glucose, Bld: 83 mg/dL (ref 70–99)
Phosphorus: 5.1 mg/dL — ABNORMAL HIGH (ref 2.5–4.6)
Potassium: 2.6 mmol/L — CL (ref 3.5–5.1)
Sodium: 137 mmol/L (ref 135–145)

## 2022-12-08 LAB — CBC
HCT: 26.6 % — ABNORMAL LOW (ref 36.0–46.0)
Hemoglobin: 8.8 g/dL — ABNORMAL LOW (ref 12.0–15.0)
MCH: 29 pg (ref 26.0–34.0)
MCHC: 33.1 g/dL (ref 30.0–36.0)
MCV: 87.8 fL (ref 80.0–100.0)
Platelets: 421 10*3/uL — ABNORMAL HIGH (ref 150–400)
RBC: 3.03 MIL/uL — ABNORMAL LOW (ref 3.87–5.11)
RDW: 15.6 % — ABNORMAL HIGH (ref 11.5–15.5)
WBC: 14.7 10*3/uL — ABNORMAL HIGH (ref 4.0–10.5)
nRBC: 0 % (ref 0.0–0.2)

## 2022-12-08 LAB — GLUCOSE, CAPILLARY
Glucose-Capillary: 86 mg/dL (ref 70–99)
Glucose-Capillary: 92 mg/dL (ref 70–99)
Glucose-Capillary: 54 mg/dL — ABNORMAL LOW (ref 70–99)
Glucose-Capillary: 71 mg/dL (ref 70–99)
Glucose-Capillary: 129 mg/dL — ABNORMAL HIGH (ref 70–99)
Glucose-Capillary: 101 mg/dL — ABNORMAL HIGH (ref 70–99)

## 2022-12-08 LAB — HEPATITIS B SURFACE ANTIGEN: Hepatitis B Surface Ag: NONREACTIVE

## 2022-12-08 LAB — HEPATITIS B CORE ANTIBODY, TOTAL: Hep B Core Total Ab: NONREACTIVE

## 2022-12-08 MED ORDER — POTASSIUM CHLORIDE 10 MEQ/100ML IV SOLN
10.0000 meq | INTRAVENOUS | Status: AC
  Administered 2022-12-08 (×2): 10 meq via INTRAVENOUS
  Filled 2022-12-08 (×2): qty 100

## 2022-12-08 MED ORDER — HEPARIN SODIUM (PORCINE) 1000 UNIT/ML DIALYSIS
20.0000 [IU]/kg | INTRAMUSCULAR | Status: DC | PRN
  Administered 2022-12-08 – 2022-12-12 (×3): 1300 [IU] via INTRAVENOUS_CENTRAL

## 2022-12-08 MED ORDER — INSULIN GLARGINE-YFGN 100 UNIT/ML ~~LOC~~ SOLN
5.0000 [IU] | Freq: Every day | SUBCUTANEOUS | Status: DC
  Administered 2022-12-08 – 2022-12-18 (×11): 5 [IU] via SUBCUTANEOUS
  Filled 2022-12-08 (×13): qty 0.05

## 2022-12-08 MED ORDER — INSULIN ASPART 100 UNIT/ML IJ SOLN
0.0000 [IU] | Freq: Three times a day (TID) | INTRAMUSCULAR | Status: DC
  Administered 2022-12-10 – 2022-12-11 (×2): 1 [IU] via SUBCUTANEOUS
  Administered 2022-12-11 – 2022-12-13 (×5): 2 [IU] via SUBCUTANEOUS
  Administered 2022-12-14: 3 [IU] via SUBCUTANEOUS
  Administered 2022-12-14: 1 [IU] via SUBCUTANEOUS
  Administered 2022-12-15: 2 [IU] via SUBCUTANEOUS
  Administered 2022-12-16 – 2022-12-17 (×2): 1 [IU] via SUBCUTANEOUS
  Administered 2022-12-18: 2 [IU] via SUBCUTANEOUS
  Administered 2022-12-18: 3 [IU] via SUBCUTANEOUS

## 2022-12-08 MED ORDER — TRAMADOL HCL 50 MG PO TABS
50.0000 mg | ORAL_TABLET | Freq: Once | ORAL | Status: AC
  Administered 2022-12-08: 50 mg via ORAL
  Filled 2022-12-08: qty 1

## 2022-12-08 MED ORDER — ALUM & MAG HYDROXIDE-SIMETH 200-200-20 MG/5ML PO SUSP
30.0000 mL | Freq: Four times a day (QID) | ORAL | Status: DC | PRN
  Administered 2022-12-08: 30 mL via ORAL
  Filled 2022-12-08: qty 30

## 2022-12-08 MED ORDER — HEPARIN SODIUM (PORCINE) 1000 UNIT/ML IJ SOLN
INTRAMUSCULAR | Status: AC
  Filled 2022-12-08: qty 7

## 2022-12-08 MED ORDER — PHENOL 1.4 % MT LIQD
1.0000 | OROMUCOSAL | Status: DC | PRN
  Administered 2022-12-08: 1 via OROMUCOSAL
  Filled 2022-12-08: qty 177

## 2022-12-08 NOTE — Progress Notes (Addendum)
PROGRESS NOTE    [name] [name]  WUJ:[id number] DOB: [birth year] DOA: 12/04/2022 PCP: Dorothyann Peng, MD   Brief Narrative:    [name] [name] is a 82 y.o. female with medical history significant for HFpEF, type 2 diabetes insulin-dependent, CKD stage IV, hypertension, dyslipidemia, chronic anemia of CKD, and prior CVA who presented to the ED with worsening shortness of breath as well as dry cough that began last night.  Patient was admitted with acute hypoxemic respiratory failure-multifactorial in the setting of volume overload as well as suspected pneumonia.  She is also noted to have worsening AKI on CKD stage IV and nephrology recommending hemodialysis, family now appears agreeable.  She is noted to have pleural effusion and will undergo thoracentesis today.  Anticipate hemodialysis initiation in the next 1-2 days.  Assessment & Plan:   Principal Problem:   Acute hypoxemic respiratory failure (HCC) Active Problems:   Chronic kidney disease (CKD), stage IV (severe) (HCC)   Essential hypertension   Mixed hyperlipidemia   Anemia due to chronic kidney disease   Diabetic retinopathy associated with type 2 diabetes mellitus (HCC)   Wound of left leg   End stage renal disease (HCC)  Assessment and Plan:  Acute hypoxemic respiratory failure-likely related to possible pneumonia -Off BiPAP; status post thoracentesis. -Left lower lobe atelectasis versus effusion with possible pneumonia. -Continue Rocephin and doxycycline for now and check procalcitonin -Scheduled breathing treatments, flutter valve and incentive spirometry has been encouraged. -Continue to wean off oxygen supplementation as needed. -Planning for a 3-4 more days of antibiotics.   AKI on CKD stage IV to V with metabolic acidosis Poor oral intake over the last 1 week noted Family now appears agreeable for hemodialysis and this will be initiated per nephrology when appropriate -Status post dialysis catheter placement;  will initiate back-to-back treatments (8/29>>8/30>>8/31); social worker aware to assist with outpatient dialysis chair.  Patient will have Sunday of with intention for fourth dialysis treatment on Monday -So far tolerated treatment without problems on 12/07/2022.  Will continue to follow nephrology service recommendations. -Continue to closely follow daily weights, volume status and response. -Continue to follow nephrology service recommendation; appreciate their assistance in care.   Left lower extremity cellulitis/wound -Continue to follow wound care as recommended by wound care service.   -Continue current antibiotics with Rocephin and doxycycline -DVT evaluation negative.   Elevated BNP -Does not appear to be related to acute CHF -Likely elevated in the setting of AKI on CKD stage IV-V patient a transition point currently based on GFR. -Continue to follow daily weights and strict I's and O's. -At the moment we will continue volume management with dialysis treatments. -Continue low-sodium diet.   Anemia of CKD -Receives Retacrit injections outpatient with last dose 8/5 -No overt bleeding appreciated. -Continue to monitor with repeat CBC, no overt bleeding noted -Transfuse for hemoglobin less than 7 -Dose of Aranesp 60 mcg per nephrology -Further treatment with Epogen as per nephrology discretion. -Follow hemoglobin trend. -No overt bleeding reported or appreciated.   DM2 with mild hyperglycemia/hypoglycemia -Continue on carb modified diet -Continue to follow CBGs fluctuation and adjust hypoglycemic regimen as needed. -Now the patient has been started on dialysis mild hypoglycemic events appreciated; insulin pump has been discontinue and the sliding scale insulin along with low-dose Semglee has been started.   Hypertension -Continue home antihypertensive agents -Continue as needed hydralazine -Follow vital signs -Anticipate improvement with dialysis -Continue to follow vital  signs.   Dyslipidemia -Continue atorvastatin -Heart healthy diet discussed  with patient.   PAF -Eliquis on hold until dialysis catheter placed 12/07/2022; anticipate ability to resume anticoagulation after. -Continue metoprolol -Normal sinus rhythm per telemetry evaluation. -Continue to follow electrolytes.   HFpEF Continue metoprolol Repeat 2D echocardiogram with previous 03/2021 with LVEF 65-70% with grade 1 diastolic dysfunction and moderate LVH Noted to have elevated BNP, but more likely related to significant renal dysfunction. -At this moment after discussing with nephrologist and following further deterioration in her renal function plan is to initiate dialysis -Dialysis catheter has been placed and ready to use (appreciate assistance and care by Dr. Henreitta Leber).  Hypokalemia -Continue to have electrolytes repleted throughout hemodialysis -Potassium 2.6; mild IV supplementation provided -Continue to follow electrolytes trend and telemetry monitoring.   DVT prophylaxis: Eliquis to be resumed once safe after dialysis catheter in place.  Not on SCDs given left lower extremity wound. Code Status: Full Family Communication: Daughter at bedside 8/28 Disposition Plan:  Status is: Inpatient  Remains inpatient appropriate because: Need for IV antibiotics and inpatient procedures.   Consultants:  Nephrology Palliative care  Procedures:  Ultrasound thoracentesis pending 8/27  Antimicrobials:  Anti-infectives (From admission, onward)    Start     Dose/Rate Route Frequency Ordered Stop   12/07/22 1045  ceFAZolin (ANCEF) IVPB 2g/100 mL premix  Status:  Discontinued        2 g 200 mL/hr over 30 Minutes Intravenous On call to O.R. 12/07/22 9604 12/07/22 1333   12/05/22 2200  doxycycline (VIBRAMYCIN) 100 mg in sodium chloride 0.9 % 250 mL IVPB        100 mg 125 mL/hr over 120 Minutes Intravenous Every 12 hours 12/04/22 1526     12/05/22 1500  cefTRIAXone (ROCEPHIN) 1 g in sodium  chloride 0.9 % 100 mL IVPB        1 g 200 mL/hr over 30 Minutes Intravenous Every 24 hours 12/04/22 1526     12/04/22 1345  cefTRIAXone (ROCEPHIN) 1 g in sodium chloride 0.9 % 100 mL IVPB        1 g 200 mL/hr over 30 Minutes Intravenous  Once 12/04/22 1335 12/04/22 1537   12/04/22 1345  doxycycline (VIBRA-TABS) tablet 100 mg        100 mg Oral  Once 12/04/22 1335 12/04/22 1507      Subjective: Chronically ill in appearance; weak and in no acute distress.  Patient denies chest pain, nausea, vomiting, abdominal pain or any other complaints.  Continue to have mild episodes of hypoglycemia after first dialysis treatment provided and is also reporting decreased appetite.  Objective: Vitals:   12/07/22 2228 12/08/22 0620 12/08/22 0820 12/08/22 1316  BP: (!) 132/99 (!) 163/70  (!) 147/82  Pulse: 80 75  78  Resp: 18 20  18   Temp: 98.9 F (37.2 C) 98 F (36.7 C)  98.2 F (36.8 C)  TempSrc: Oral Oral    SpO2: 99% 99% 97% 97%  Weight:      Height:        Intake/Output Summary (Last 24 hours) at 12/08/2022 1345 Last data filed at 12/07/2022 2145 Gross per 24 hour  Intake --  Output 1000 ml  Net -1000 ml   Filed Weights   12/04/22 1224 12/07/22 1033 12/07/22 1900  Weight: 64.4 kg 64.4 kg 64.5 kg    Examination: General exam: Chronically ill in appearance; no chest pain, no nausea, no vomiting.  Breathing easier.  No overnight events reported. Respiratory system: Improved air movement bilaterally; no using accessory muscle.  2 L nasal cannula placed. Cardiovascular system:RRR. No rubs or gallops; no JVD. Gastrointestinal system: Abdomen is nondistended, soft and nontender. No organomegaly or masses felt. Normal bowel sounds heard. Central nervous system: Alert and oriented. No focal neurological deficits. Extremities: No cyanosis or clubbing.  No edema on exam. Skin: No petechiae; left lower extremity erythematous changes significantly improved, wound without active drainage and  positive scab appreciated.  Continue to have pain around wound. Psychiatry: Judgement and insight appear normal.  Flat affect.  Data Reviewed: I have personally reviewed following labs and imaging studies  CBC: Recent Labs  Lab 12/04/22 1225 12/05/22 0427 12/06/22 0404 12/07/22 2000 12/08/22 0447  WBC 9.8 10.1 10.6* 11.4* 14.7*  HGB 9.3* 8.6* 8.3* 8.1* 8.8*  HCT 29.1* 26.1* 25.4* 24.4* 26.6*  MCV 89.5 89.1 89.1 88.7 87.8  PLT 460* 419* 417* 436* 421*   Basic Metabolic Panel: Recent Labs  Lab 12/04/22 1225 12/05/22 0427 12/06/22 0404 12/07/22 2000 12/08/22 0447  NA 135 135 136 139 137  K 4.2 4.0 3.5 2.9* 2.6*  CL 107 108 108 104 103  CO2 11* 12* 13* 21* 26  GLUCOSE 150* 106* 140* 139* 83  BUN 105* 97* 98* 84* 59*  CREATININE 10.03* 9.97* 9.81* 8.82* 6.38*  CALCIUM 8.7* 8.7* 8.3* 7.7* 7.7*  MG 3.2* 3.0* 2.8*  --   --   PHOS  --   --   --  6.7* 5.1*   GFR: Estimated Creatinine Clearance: 5.8 mL/min (A) (by C-G formula based on SCr of 6.38 mg/dL (H)).  CBG: Recent Labs  Lab 12/07/22 2245 12/08/22 0335 12/08/22 0735 12/08/22 1127 12/08/22 1142  GLUCAP 95 86 92 54* 71   Sepsis Labs: Recent Labs  Lab 12/05/22 1418  PROCALCITON 0.35    Recent Results (from the past 240 hour(s))  Resp panel by RT-PCR (RSV, Flu A&B, Covid) Anterior Nasal Swab     Status: None   Collection Time: 12/04/22 12:26 PM   Specimen: Anterior Nasal Swab  Result Value Ref Range Status   SARS Coronavirus 2 by RT PCR NEGATIVE NEGATIVE Final    Comment: (NOTE) SARS-CoV-2 target nucleic acids are NOT DETECTED.  The SARS-CoV-2 RNA is generally detectable in upper respiratory specimens during the acute phase of infection. The lowest concentration of SARS-CoV-2 viral copies this assay can detect is 138 copies/mL. A negative result does not preclude SARS-Cov-2 infection and should not be used as the sole basis for treatment or other patient management decisions. A negative result may occur  with  improper specimen collection/handling, submission of specimen other than nasopharyngeal swab, presence of viral mutation(s) within the areas targeted by this assay, and inadequate number of viral copies(<138 copies/mL). A negative result must be combined with clinical observations, patient history, and epidemiological information. The expected result is Negative.  Fact Sheet for Patients:  BloggerCourse.com  Fact Sheet for Healthcare Providers:  SeriousBroker.it  This test is no t yet approved or cleared by the [place] FDA and  has been authorized for detection and/or diagnosis of SARS-CoV-2 by FDA under an Emergency Use Authorization (EUA). This EUA will remain  in effect (meaning this test can be used) for the duration of the COVID-19 declaration under Section 564(b)(1) of the Act, 21 U.S.C.section 360bbb-3(b)(1), unless the authorization is terminated  or revoked sooner.       Influenza A by PCR NEGATIVE NEGATIVE Final   Influenza B by PCR NEGATIVE NEGATIVE Final    Comment: (NOTE) The Xpert Xpress SARS-CoV-2/FLU/RSV  plus assay is intended as an aid in the diagnosis of influenza from Nasopharyngeal swab specimens and should not be used as a sole basis for treatment. Nasal washings and aspirates are unacceptable for Xpert Xpress SARS-CoV-2/FLU/RSV testing.  Fact Sheet for Patients: BloggerCourse.com  Fact Sheet for Healthcare Providers: SeriousBroker.it  This test is not yet approved or cleared by the [place] FDA and has been authorized for detection and/or diagnosis of SARS-CoV-2 by FDA under an Emergency Use Authorization (EUA). This EUA will remain in effect (meaning this test can be used) for the duration of the COVID-19 declaration under Section 564(b)(1) of the Act, 21 U.S.C. section 360bbb-3(b)(1), unless the authorization is terminated  or revoked.     Resp Syncytial Virus by PCR NEGATIVE NEGATIVE Final    Comment: (NOTE) Fact Sheet for Patients: BloggerCourse.com  Fact Sheet for Healthcare Providers: SeriousBroker.it  This test is not yet approved or cleared by the [place] FDA and has been authorized for detection and/or diagnosis of SARS-CoV-2 by FDA under an Emergency Use Authorization (EUA). This EUA will remain in effect (meaning this test can be used) for the duration of the COVID-19 declaration under Section 564(b)(1) of the Act, 21 U.S.C. section 360bbb-3(b)(1), unless the authorization is terminated or revoked.  Performed at [facility], [address] [place], [place] [address]   Gram stain     Status: None   Collection Time: 12/06/22  9:40 AM   Specimen: Pleura  Result Value Ref Range Status   Specimen Description PLEURAL  Final   Special Requests NONE  Final   Gram Stain   Final    CYTOSPIN SMEAR NO ORGANISMS SEEN WBC PRESENT,BOTH PMN AND MONONUCLEAR Performed at [facility], [address] [place], [place] [address]    Report Status 12/06/2022 FINAL  Final  Culture, body fluid w Gram Stain-bottle     Status: None (Preliminary result)   Collection Time: 12/06/22  9:40 AM   Specimen: Pleura  Result Value Ref Range Status   Specimen Description PLEURAL  Final   Special Requests 10CC BOTTLES DRAWN AEROBIC AND ANAEROBIC  Final   Culture   Final    NO GROWTH 2 DAYS Performed at [facility], [address], [place], [place] [address]    Report Status PENDING  Incomplete  Surgical PCR screen     Status: None   Collection Time: 12/07/22  3:07 AM   Specimen: Nasal Mucosa; Nasal Swab  Result Value Ref Range Status   MRSA, PCR NEGATIVE NEGATIVE Final   Staphylococcus aureus NEGATIVE NEGATIVE Final    Comment: (NOTE) The Xpert SA Assay (FDA approved for NASAL specimens in patients 108 years of age and older), is one component of a  comprehensive surveillance program. It is not intended to diagnose infection nor to guide or monitor treatment. Performed at [facility], [address] [place], [place] [address]     Radiology Studies: [facility] 1 View  Result Date: 12/07/2022 CLINICAL DATA:  Dialysis catheter placement. EXAM: PORTABLE CHEST 1 VIEW COMPARISON:  12/06/2022 FINDINGS: Right IJ central line tip overlies the distal SVC level. No evidence for pneumothorax. Diffuse vascular congestion with interstitial pulmonary edema, progressive in the interval. Bibasilar collapse/consolidation with small right and tiny left pleural effusions. The cardio pericardial silhouette is enlarged. IMPRESSION: 1. Right IJ central line tip overlies the distal SVC level. No pneumothorax. 2. Progressive vascular congestion and interstitial pulmonary edema. 3. Bibasilar collapse/consolidation with small right and tiny left pleural effusions. Electronically  Signed   By: Kennith Center M.D.   On: 12/07/2022 13:38   DG C-Arm 1-60 Min-No Report  Result Date: 12/07/2022 Fluoroscopy was utilized by the requesting physician.  No radiographic interpretation.    Scheduled Meds:  amLODipine  5 mg Oral Daily   atorvastatin  20 mg Oral Daily   budesonide (PULMICORT) nebulizer solution  0.25 mg Nebulization BID   Chlorhexidine Gluconate Cloth  6 each Topical Q0600   famotidine  20 mg Oral QHS   fluticasone  1 spray Each Nare Daily   hydrALAZINE  25 mg Oral TID   insulin aspart  0-6 Units Subcutaneous TID WC   insulin glargine-yfgn  5 Units Subcutaneous Daily   insulin pump   Subcutaneous TID WC, HS, 0200   isosorbide mononitrate  60 mg Oral Daily   latanoprost  1 drop Both Eyes QHS   loratadine  10 mg Oral Daily   metoprolol succinate  12.5 mg Oral Daily   mupirocin ointment  1 Application Nasal BID   Continuous Infusions:  cefTRIAXone (ROCEPHIN)  IV 1 g (12/07/22 1712)   doxycycline (VIBRAMYCIN) IV 100 mg (12/07/22 2244)   sodium  bicarbonate 150 mEq in dextrose 5 % 1,150 mL infusion Stopped (12/08/22 1030)     LOS: 4 days   Time spent: 45 minutes  Kaloni Bisaillon,MD [facility]  If 7PM-7AM, please contact night-coverage [email or web address] 12/08/2022, 1:45 PM

## 2022-12-08 NOTE — Care Management Important Message (Signed)
Important Message  Patient Details  Name: [name] [name] MRN: [id number] Date of Birth: [birth year]   Medicare Important Message Given:  Yes     Corey Harold 12/08/2022, 11:06 AM

## 2022-12-08 NOTE — Inpatient Diabetes Management (Addendum)
Inpatient Diabetes Program Recommendations  AACE/ADA: New Consensus Statement on Inpatient Glycemic Control (2015)  Target Ranges:  Prepandial:   less than 140 mg/dL      Peak postprandial:   less than 180 mg/dL (1-2 hours)      Critically ill patients:  140 - 180 mg/dL    Latest Reference Range & Units 12/07/22 03:14 12/07/22 03:56 12/07/22 07:27 12/07/22 10:28 12/07/22 13:40 12/07/22 16:31 12/07/22 22:45  Glucose-Capillary 70 - 99 mg/dL 54 (L) 952 (H) 841 (H) 101 (H) 78 147 (H) 95  (L): Data is abnormally low (H): Data is abnormally high  Latest Reference Range & Units 12/08/22 03:35 12/08/22 07:35 12/08/22 11:27 12/08/22 11:42  Glucose-Capillary 70 - 99 mg/dL 86 92 54 (L) 71  (L): Data is abnormally low      Home DM Meds: OmniPod insulin pump with Humalog     Libre CGM     Ozempic 1 mg Qweek   Current Orders: Insulin Pump AC/HS/2am    Note Hypoglycemia early yest AM (54 at 3am) and then Hypo again today at 11:30am (CBG 54).  Concern pt may need downward basal rate adjustments on her insulin pump?    Spoke w/ pt's daughter Lamar Laundry around 1pm by phone.  Daughter concerned about the pt's blood sugars and Hypoglycemia and requesting we take pt off the Insulin Pump and use basal-bolus insulin while off the pump until pt can go back to the Endocrinologist for pump adjustments now that pt starting dialysis.   MD- Please consider:  1. Have pt's daughter stop the pt's Insulin Pump  2. Immediately start Semglee 8 units Daily (80% home dose of the basal insulin on the pump)  3. Start Novolog Sensitive Correction Scale/ SSI (0-9 units) Q4 hours    Addendum 1:05pm--Spoke w/ Dr. Gwenlyn Perking about this pt.  Plan is to have daughter remove pt's insulin pump as soon as Semglee insulin is given and we will manage CBGs off the insulin pump for now.  Orders placed for Semglee 5 units Daily (1st dose this afternoon) + Novolog 0-6 units SSI TID.  Addendum 2:30pm--Spoke w/ Chelsea RN caring for  pt.  Reviewed plan for removal of Insulin Pump and start of Semglee and Novolog.  RN knows to have daughter remove insulin pump immediately once Semglee insulin has been given.  See RN note as well.  Semglee insulin given at ~2pm and Pump removed by Daughter.       --Will follow patient during hospitalization--  Ambrose Finland RN, MSN, CDCES Diabetes Coordinator Inpatient Glycemic Control Team Team Pager: [phone] (8a-5p)

## 2022-12-08 NOTE — Anesthesia Postprocedure Evaluation (Signed)
Anesthesia Post Note  Patient: MEIGHAN SCHAAP  Procedure(s) Performed: INSERTION OF DIALYSIS CATHETER (Right: Chest)  Patient location during evaluation: Phase II Anesthesia Type: MAC Level of consciousness: awake Pain management: pain level controlled Vital Signs Assessment: post-procedure vital signs reviewed and stable Respiratory status: spontaneous breathing and respiratory function stable Cardiovascular status: blood pressure returned to baseline and stable Postop Assessment: no headache and no apparent nausea or vomiting Anesthetic complications: no Comments: Late entry   No notable events documented.   Last Vitals:  Vitals:   12/08/22 0820 12/08/22 1316  BP:  (!) 147/82  Pulse:  78  Resp:  18  Temp:  36.8 C  SpO2: 97% 97%    Last Pain:  Vitals:   12/08/22 0739  TempSrc:   PainSc: Asleep                 Windell Norfolk

## 2022-12-08 NOTE — Progress Notes (Addendum)
   12/08/22 0615  Provider Notification  Provider Name/Title Asia B Zierle-Ghosh/DO  Date Provider Notified 12/08/22  Time Provider Notified [phone]  Method of Notification Call (secure chat)  Notification Reason Critical Result  Test performed and critical result Potassium 2.6  Date Critical Result Received 12/08/22  Time Critical Result Received 0615  Provider response Other (Comment) (waiting on response)   New orders given. See Mar.

## 2022-12-08 NOTE — Progress Notes (Signed)
   HEMODIALYSIS TREATMENT NOTE:  2.5 hour low-heparin treatment completed using RIJ TDC. Cath exit site is unremarkable. Goal met: 1 liter removed without interruption in UF.  All blood was returned.    12/08/22 1900  Vitals  BP (!) 173/74  MAP (mmHg) 103  BP Location Right Arm  BP Method Automatic  Patient Position (if appropriate) Lying  Pulse Rate 93  Pulse Rate Source Monitor  ECG Heart Rate 80  Resp 19  Post Treatment  Dialyzer Clearance Lightly streaked  Hemodialysis Intake (mL) 0 mL  Liters Processed 37.5  Fluid Removed (mL) 1000 mL  Tolerated HD Treatment Yes (frequent groaning while denying specific needs)  Post-Hemodialysis Comments Goal met  Hemodialysis Catheter Right Internal jugular Double lumen Permanent (Tunneled)  Placement Date/Time: 12/07/22 1215   Placed prior to admission: No  Serial / Lot #: [id number]  Expiration Date: 10/15/25  Time Out: Correct patient;Correct site;Correct procedure  Maximum sterile barrier precautions: Hand hygiene;Cap;Mask;Sterile gow...  Site Condition No complications  Blue Lumen Status Flushed;Heparin locked;Dead end cap in place  Red Lumen Status Flushed;Heparin locked;Dead end cap in place  Purple Lumen Status N/A  Catheter fill solution Heparin 1000 units/ml  Catheter fill volume (Arterial) 1.6 cc  Catheter fill volume (Venous) 1.6  Dressing Type Transparent;Tube stabilization device  Dressing Status Antimicrobial disc in place;Clean, Dry, Intact  Interventions Dressing reinforced  Drainage Description None  Dressing Change Due 12/14/22  Post treatment catheter status Capped and Clamped   Pt was transported back to 317.  Report called to Calton Golds, RN.   Arman Filter, RN AP [facility]

## 2022-12-08 NOTE — Progress Notes (Addendum)
Patient ID: [name] [name], female   DOB: July 22, 1940, 82 y.o.   MRN: [id number] S: Doesn't feel well but cannot elaborate.  O:BP (!) 163/70 (BP Location: Right Arm)   Pulse 75   Temp 98 F (36.7 C) (Oral)   Resp 20   Ht 5\' 2"  (1.575 m)   Wt 64.5 kg   SpO2 97%   BMI 26.01 kg/m   Intake/Output Summary (Last 24 hours) at 12/08/2022 1124 Last data filed at 12/07/2022 2145 Gross per 24 hour  Intake 250 ml  Output 1005 ml  Net -755 ml   Intake/Output: I/O last 3 completed shifts: In: 250 [I.V.:250] Out: 1005 [Other:1000; Blood:5]  Intake/Output this shift:  No intake/output data recorded. Weight change:  Gen: NAD CVS: RRR Resp:CTA Abd: +BS, soft, NT/ND Ext: no edema  Recent Labs  Lab 12/04/22 1225 12/05/22 0427 12/06/22 0404 12/07/22 2000 12/08/22 0447  NA 135 135 136 139 137  K 4.2 4.0 3.5 2.9* 2.6*  CL 107 108 108 104 103  CO2 11* 12* 13* 21* 26  GLUCOSE 150* 106* 140* 139* 83  BUN 105* 97* 98* 84* 59*  CREATININE 10.03* 9.97* 9.81* 8.82* 6.38*  ALBUMIN  --   --   --  2.2* 2.3*  CALCIUM 8.7* 8.7* 8.3* 7.7* 7.7*  PHOS  --   --   --  6.7* 5.1*   Liver Function Tests: Recent Labs  Lab 12/07/22 2000 12/08/22 0447  ALBUMIN 2.2* 2.3*   No results for input(s): "LIPASE", "AMYLASE" in the last 168 hours. No results for input(s): "AMMONIA" in the last 168 hours. CBC: Recent Labs  Lab 12/04/22 1225 12/05/22 0427 12/06/22 0404 12/07/22 2000 12/08/22 0447  WBC 9.8 10.1 10.6* 11.4* 14.7*  HGB 9.3* 8.6* 8.3* 8.1* 8.8*  HCT 29.1* 26.1* 25.4* 24.4* 26.6*  MCV 89.5 89.1 89.1 88.7 87.8  PLT 460* 419* 417* 436* 421*   Cardiac Enzymes: No results for input(s): "CKTOTAL", "CKMB", "CKMBINDEX", "TROPONINI" in the last 168 hours. CBG: Recent Labs  Lab 12/07/22 1340 12/07/22 1631 12/07/22 2245 12/08/22 0335 12/08/22 0735  GLUCAP 78 147* 95 86 92    Iron Studies: No results for input(s): "IRON", "TIBC", "TRANSFERRIN", "FERRITIN" in the last 72  hours. Studies/Results: DG Chest Port 1 View  Result Date: 12/07/2022 CLINICAL DATA:  Dialysis catheter placement. EXAM: PORTABLE CHEST 1 VIEW COMPARISON:  12/06/2022 FINDINGS: Right IJ central line tip overlies the distal SVC level. No evidence for pneumothorax. Diffuse vascular congestion with interstitial pulmonary edema, progressive in the interval. Bibasilar collapse/consolidation with small right and tiny left pleural effusions. The cardio pericardial silhouette is enlarged. IMPRESSION: 1. Right IJ central line tip overlies the distal SVC level. No pneumothorax. 2. Progressive vascular congestion and interstitial pulmonary edema. 3. Bibasilar collapse/consolidation with small right and tiny left pleural effusions. Electronically Signed   By: Kennith Center M.D.   On: 12/07/2022 13:38   DG C-Arm 1-60 Min-No Report  Result Date: 12/07/2022 Fluoroscopy was utilized by the requesting physician.  No radiographic interpretation.    amLODipine  5 mg Oral Daily   atorvastatin  20 mg Oral Daily   budesonide (PULMICORT) nebulizer solution  0.25 mg Nebulization BID   Chlorhexidine Gluconate Cloth  6 each Topical Q0600   famotidine  20 mg Oral QHS   fluticasone  1 spray Each Nare Daily   hydrALAZINE  25 mg Oral TID   insulin pump   Subcutaneous TID WC, HS, 0200   isosorbide mononitrate  60 mg  Oral Daily   latanoprost  1 drop Both Eyes QHS   loratadine  10 mg Oral Daily   metoprolol succinate  12.5 mg Oral Daily   mupirocin ointment  1 Application Nasal BID    BMET    Component Value Date/Time   NA 137 12/08/2022 0447   NA 142 06/26/2022 0000   K 2.6 (LL) 12/08/2022 0447   CL 103 12/08/2022 0447   CO2 26 12/08/2022 0447   GLUCOSE 83 12/08/2022 0447   BUN 59 (H) 12/08/2022 0447   BUN 51 (A) 06/26/2022 0000   CREATININE 6.38 (H) 12/08/2022 0447   CREATININE 1.95 (H) 10/09/2018 0905   CALCIUM 7.7 (L) 12/08/2022 0447   GFRNONAA 6 (L) 12/08/2022 0447   GFRNONAA 24 (L) 10/09/2018 0905    GFRAA 20 (L) 03/15/2020 1149   GFRAA 27 (L) 10/09/2018 0905   CBC    Component Value Date/Time   WBC 14.7 (H) 12/08/2022 0447   RBC 3.03 (L) 12/08/2022 0447   HGB 8.8 (L) 12/08/2022 0447   HGB 9.1 (L) 05/30/2022 1001   HCT 26.6 (L) 12/08/2022 0447   HCT 28.3 (L) 05/30/2022 1001   PLT 421 (H) 12/08/2022 0447   PLT 357 05/30/2022 1001   MCV 87.8 12/08/2022 0447   MCV 91 05/30/2022 1001   MCH 29.0 12/08/2022 0447   MCHC 33.1 12/08/2022 0447   RDW 15.6 (H) 12/08/2022 0447   RDW 12.7 05/30/2022 1001   LYMPHSABS 1.5 10/16/2022 1438   MONOABS 0.8 10/16/2022 1438   EOSABS 0.2 10/16/2022 1438   BASOSABS 0.1 10/16/2022 1438    Assessment/Plan:   Acute hypoxic respiratory failure - possible pneumonia vs volume overload.  S/p thoracentesis of 700 mL 12/06/22 with some improvement.  Plan for HD today with UF. CKD stage V - pt has been followed in our office for quite some time and she has always stated that she would rather die than go on dialysis, however it appears that her family has changed her mind.  We discussed the need for [facility] and eventually an AVF/AVG.  We also discussed home dialysis with PD and HHD.  Family is interested.  She had TDC placed 12/07/22 by Dr. Henreitta Leber and had her first HD session yesterday.  Plan for HD today and again tomorrow then will be on MWF schedule until outpatient HD arrangements can be made.  Avoid nephrotoxic medications including NSAIDs and iodinated intravenous contrast exposure unless the latter is absolutely indicated. Preferred narcotic agents for pain control are hydromorphone, fentanyl, and methadone. Morphine should not be used. Avoid Baclofen and avoid oral sodium phosphate and magnesium citrate based laxatives / bowel preps. Continue strict Input and Output monitoring.  Will monitor the patient closely with you and intervene or adjust therapy as indicated by changes in clinical status/labs  Left leg cellulitis - with wound.  Currently on rocephin and  doxycycline. Anemia of CKD stage V - started on Aranesp.  Transfuse for Hb <7 DM type 2 - per primary Hypokalemia - given IV KCl and will use added K bath.  HTN/Volume - has some volume overload.  I's/O's not recorded.  Will UF with HD after TDC placed.  Debilitation - will likely require SNF for short term rehab given her poor functional status. Disposition - pt's family would like to go to [facility] as it is closer to her home.  SW/CM will need to arrange for acceptance at that facility prior to discharge.   Irena Cords, MD [facility]  The patient will not be physically seen over the weekend, however labs and notes will be reviewed remotely and on-call coverage is available if needed.

## 2022-12-08 NOTE — TOC Progression Note (Signed)
Transition of Care [facility]) - Progression Note    Patient Details  Name: [name] [name] MRN: [id number] Date of Birth: 1940-04-15  Transition of Care [facility]) CM/SW Contact  Annice Needy, LCSW Phone Number: 12/08/2022, 3:59 PM  Clinical Narrative:    Tentative chair time at [facility] will be TTS 10:30am   Expected Discharge Plan: Skilled Nursing Facility Barriers to Discharge: Continued Medical Work up  Expected Discharge Plan and Services In-house Referral: Clinical Social Work     Living arrangements for the past 2 months: Single Family Home                                       Social Determinants of Health (SDOH) Interventions SDOH Screenings   Food Insecurity: No Food Insecurity (12/04/2022)  Housing: Low Risk  (12/04/2022)  Transportation Needs: No Transportation Needs (12/04/2022)  Utilities: Not At Risk (12/04/2022)  Depression (PHQ2-9): Low Risk  (08/17/2022)  Financial Resource Strain: Low Risk  (10/19/2021)  Physical Activity: Inactive (10/19/2021)  Stress: No Stress Concern Present (10/19/2021)  Tobacco Use: Low Risk  (12/07/2022)    Readmission Risk Interventions    12/05/2022    3:37 PM  Readmission Risk Prevention Plan  Transportation Screening Complete  HRI or Home Care Consult Complete  Social Work Consult for Recovery Care Planning/Counseling Complete  Palliative Care Screening Complete  Medication Review [employer]) Complete

## 2022-12-08 NOTE — Plan of Care (Signed)

## 2022-12-08 NOTE — Progress Notes (Signed)
Per Diabetes Coordinator 5U of semglee given and daughter removed insulin pump. Per orders we will begin coverage with novalog.

## 2022-12-09 DIAGNOSIS — J189 Pneumonia, unspecified organism: Secondary | ICD-10-CM | POA: Diagnosis not present

## 2022-12-09 DIAGNOSIS — S81802D Unspecified open wound, left lower leg, subsequent encounter: Secondary | ICD-10-CM | POA: Diagnosis not present

## 2022-12-09 DIAGNOSIS — E782 Mixed hyperlipidemia: Secondary | ICD-10-CM | POA: Diagnosis not present

## 2022-12-09 DIAGNOSIS — J9601 Acute respiratory failure with hypoxia: Secondary | ICD-10-CM | POA: Diagnosis not present

## 2022-12-09 DIAGNOSIS — Z5189 Encounter for other specified aftercare: Secondary | ICD-10-CM | POA: Insufficient documentation

## 2022-12-09 LAB — RENAL FUNCTION PANEL
Albumin: 2.2 g/dL — ABNORMAL LOW (ref 3.5–5.0)
Anion gap: 11 (ref 5–15)
BUN: 38 mg/dL — ABNORMAL HIGH (ref 8–23)
CO2: 26 mmol/L (ref 22–32)
Calcium: 7.8 mg/dL — ABNORMAL LOW (ref 8.9–10.3)
Chloride: 99 mmol/L (ref 98–111)
Creatinine, Ser: 4.62 mg/dL — ABNORMAL HIGH (ref 0.44–1.00)
GFR, Estimated: 9 mL/min — ABNORMAL LOW (ref >60)
Glucose, Bld: 113 mg/dL — ABNORMAL HIGH (ref 70–99)
Phosphorus: 4 mg/dL (ref 2.5–4.6)
Potassium: 3.1 mmol/L — ABNORMAL LOW (ref 3.5–5.1)
Sodium: 136 mmol/L (ref 135–145)

## 2022-12-09 LAB — CBC
HCT: 27.4 % — ABNORMAL LOW (ref 36.0–46.0)
Hemoglobin: 9 g/dL — ABNORMAL LOW (ref 12.0–15.0)
MCH: 29.2 pg (ref 26.0–34.0)
MCHC: 32.8 g/dL (ref 30.0–36.0)
MCV: 89 fL (ref 80.0–100.0)
Platelets: 446 10*3/uL — ABNORMAL HIGH (ref 150–400)
RBC: 3.08 MIL/uL — ABNORMAL LOW (ref 3.87–5.11)
RDW: 15.9 % — ABNORMAL HIGH (ref 11.5–15.5)
WBC: 18 10*3/uL — ABNORMAL HIGH (ref 4.0–10.5)
nRBC: 0 % (ref 0.0–0.2)

## 2022-12-09 LAB — GLUCOSE, CAPILLARY
Glucose-Capillary: 117 mg/dL — ABNORMAL HIGH (ref 70–99)
Glucose-Capillary: 146 mg/dL — ABNORMAL HIGH (ref 70–99)
Glucose-Capillary: 147 mg/dL — ABNORMAL HIGH (ref 70–99)
Glucose-Capillary: 109 mg/dL — ABNORMAL HIGH (ref 70–99)
Glucose-Capillary: 100 mg/dL — ABNORMAL HIGH (ref 70–99)

## 2022-12-09 LAB — HEPATITIS B SURFACE ANTIBODY, QUANTITATIVE: Hep B S AB Quant (Post): <3.5 m[IU]/mL — ABNORMAL LOW

## 2022-12-09 MED ORDER — TRAMADOL HCL 50 MG PO TABS
50.0000 mg | ORAL_TABLET | Freq: Once | ORAL | Status: AC
  Administered 2022-12-09: 50 mg via ORAL
  Filled 2022-12-09: qty 1

## 2022-12-09 MED ORDER — POLYETHYLENE GLYCOL 3350 17 G PO PACK
17.0000 g | PACK | Freq: Every day | ORAL | Status: DC | PRN
  Administered 2022-12-09: 17 g via ORAL
  Filled 2022-12-09 (×2): qty 1

## 2022-12-09 MED ORDER — POTASSIUM CHLORIDE CRYS ER 10 MEQ PO TBCR
10.0000 meq | EXTENDED_RELEASE_TABLET | Freq: Once | ORAL | Status: AC
  Administered 2022-12-09: 10 meq via ORAL
  Filled 2022-12-09: qty 1

## 2022-12-09 MED ORDER — HEPARIN SODIUM (PORCINE) 1000 UNIT/ML IJ SOLN
INTRAMUSCULAR | Status: AC
  Filled 2022-12-09: qty 4

## 2022-12-09 MED ORDER — PROCHLORPERAZINE EDISYLATE 10 MG/2ML IJ SOLN
10.0000 mg | Freq: Four times a day (QID) | INTRAMUSCULAR | Status: DC | PRN
  Administered 2022-12-09 – 2022-12-12 (×3): 10 mg via INTRAVENOUS
  Filled 2022-12-09 (×3): qty 2

## 2022-12-09 MED ORDER — HALOPERIDOL LACTATE 5 MG/ML IJ SOLN
1.0000 mg | Freq: Four times a day (QID) | INTRAMUSCULAR | Status: DC | PRN
  Administered 2022-12-09 – 2022-12-10 (×2): 1 mg via INTRAMUSCULAR
  Filled 2022-12-09 (×2): qty 1

## 2022-12-09 NOTE — Assessment & Plan Note (Signed)
[facility] Nurse notified about need for wound care

## 2022-12-09 NOTE — Evaluation (Signed)
Physical Therapy Evaluation Patient Details Name: [name] [name] MRN: [id number] DOB: 1941-04-10 Today's Date: 12/09/2022  History of Present Illness  per MD note today:[name] [name] is a 82 y.o. female with medical history significant for HFpEF, type 2 diabetes insulin-dependent, CKD stage IV, hypertension, dyslipidemia, chronic anemia of CKD, and prior CVA who presented to the ED with worsening shortness of breath as well as dry cough that began last night.  Patient was admitted with acute hypoxemic respiratory failure-multifactorial in the setting of volume overload as well as suspected pneumonia.  She is also noted to have worsening AKI on CKD stage IV and nephrology recommending hemodialysis, family now appears agreeable.  She is noted to have pleural effusion and will undergo thoracentesis today.  Anticipate hemodialysis initiation in the next 1-2 days.  Clinical Impression  Pt with eyes closed entire treatment.  Therapist sat pt on edge of bed attempting to arouse but this did not help therefore pt was not transferred to chair.  Pt will need SNF placement.         If plan is discharge home, recommend the following: Two people to help with walking and/or transfers;Assistance with cooking/housework;Assist for transportation;Help with stairs or ramp for entrance;Assistance with feeding;Two people to help with bathing/dressing/bathroom   Can travel by private vehicle   No    Equipment Recommendations None recommended by PT  Recommendations for Other Services   OT   Functional Status Assessment Patient has had a recent decline in their functional status and demonstrates the ability to make significant improvements in function in a reasonable and predictable amount of time.     Precautions / Restrictions Precautions Precautions: Fall      Mobility  Bed Mobility Overal bed mobility: Needs Assistance Bed Mobility: Rolling, Sidelying to Sit Rolling: Max assist Sidelying to  sit: Max assist            Transfers                   General transfer comment: attempted pt will not assist at all.       Balance Overall balance assessment: Needs assistance Sitting-balance support: Single extremity supported Sitting balance-Leahy Scale: Poor   Postural control: Left lateral lean                                   Pertinent Vitals/Pain Pain Assessment Pain Assessment: Faces Faces Pain Scale: Hurts little more Pain Location: Pt states all over Pain Descriptors / Indicators: Grimacing Pain Intervention(s): Limited activity within patient's tolerance    Home Living Family/patient expects to be discharged to:: Skilled nursing facility Living Arrangements: Spouse/significant other                      Prior Function Prior Level of Function : Needs assist             Mobility Comments: PT ambulated with walker in home. ADLs Comments: Daughter assists in shopping and cleaning     Extremity/Trunk Assessment        Lower Extremity Assessment Lower Extremity Assessment: Difficult to assess due to impaired cognition;Generalized weakness       Communication   Communication Communication: Difficulty following commands/understanding Cueing Techniques: Verbal cues;Gestural cues;Tactile cues  Cognition Arousal: Lethargic   Overall Cognitive Status: Impaired/Different from baseline Area of Impairment: Attention, Following commands  General Comments: does not follow commands, only opens her eyes occasionally.           Exercises General Exercises - Lower Extremity Ankle Circles/Pumps: PROM, 10 reps Heel Slides: PROM, 5 reps Hip ABduction/ADduction: PROM, 5 reps   Assessment/Plan    PT Assessment Patient needs continued PT services  PT Problem List Decreased strength;Decreased activity tolerance;Decreased mobility;Decreased balance;Pain       PT Treatment  Interventions Functional mobility training;Therapeutic activities;Therapeutic exercise;Balance training;Neuromuscular re-education    PT Goals (Current goals can be found in the Care Plan section)  Acute Rehab PT Goals Patient Stated Goal: to be able to walk again PT Goal Formulation: With family Potential to Achieve Goals: Poor    Frequency Min 2X/week        AM-PAC PT "6 Clicks" Mobility  Outcome Measure Help needed turning from your back to your side while in a flat bed without using bedrails?: Total Help needed moving from lying on your back to sitting on the side of a flat bed without using bedrails?: Total Help needed moving to and from a bed to a chair (including a wheelchair)?: Total Help needed standing up from a chair using your arms (e.g., wheelchair or bedside chair)?: Total Help needed to walk in hospital room?: Total Help needed climbing 3-5 steps with a railing? : Total 6 Click Score: 6    End of Session   Activity Tolerance: Patient limited by lethargy Patient left: in bed;with call bell/phone within reach;with family/visitor present   PT Visit Diagnosis: Other abnormalities of gait and mobility (R26.89);Muscle weakness (generalized) (M62.81);Pain Pain - Right/Left: Right Pain - part of body: Arm    Time: 1035-1110 PT Time Calculation (min) (ACUTE ONLY): 35 min   Charges:   PT Evaluation $PT Eval Low Complexity: 1 Low PT Treatments $Therapeutic Exercise: 8-22 mins PT General Charges $$ ACUTE PT VISIT: 1 Visit         Virgina Organ, PT CLT [phone]  12/09/2022, 11:15 AM

## 2022-12-09 NOTE — Assessment & Plan Note (Signed)
D/C Bactrim DS; Rocephin IM 1g given in the office; Take Doxycycline  BID for 5 days.

## 2022-12-09 NOTE — Progress Notes (Signed)
PROGRESS NOTE    DOLL MISE  QMV:[id number] DOB: 09/18/1940 DOA: 12/04/2022 PCP: Dorothyann Peng, MD   Brief Narrative:    [name] [name] is a 82 y.o. female with medical history significant for HFpEF, type 2 diabetes insulin-dependent, CKD stage IV, hypertension, dyslipidemia, chronic anemia of CKD, and prior CVA who presented to the ED with worsening shortness of breath as well as dry cough that began last night.  Patient was admitted with acute hypoxemic respiratory failure-multifactorial in the setting of volume overload as well as suspected pneumonia.  She is also noted to have worsening AKI on CKD stage IV and nephrology recommending hemodialysis, family now appears agreeable.  She is noted to have pleural effusion and will undergo thoracentesis today.  Anticipate hemodialysis initiation in the next 1-2 days.  Assessment & Plan:   Principal Problem:   Acute hypoxemic respiratory failure (HCC) Active Problems:   Chronic kidney disease (CKD), stage IV (severe) (HCC)   Essential hypertension   Mixed hyperlipidemia   Anemia due to chronic kidney disease   Diabetic retinopathy associated with type 2 diabetes mellitus (HCC)   Wound of left leg   End stage renal disease (HCC)  Assessment and Plan:  Acute hypoxemic respiratory failure-likely related to possible pneumonia -Off BiPAP; status post thoracentesis. -Left lower lobe atelectasis versus effusion with possible pneumonia. -Continue Rocephin and doxycycline for now and check procalcitonin -Scheduled breathing treatments, flutter valve and incentive spirometry has been encouraged. -Continue to wean off oxygen supplementation as needed. -Planning for a 3-4 more days of antibiotics.   AKI on CKD stage IV to V with metabolic acidosis Poor oral intake over the last 1 week noted Family now appears agreeable for hemodialysis and this will be initiated per nephrology when appropriate -Status post dialysis catheter placement;  will initiate back-to-back treatments (8/29>>8/30>>8/31); social worker aware to assist with outpatient dialysis chair.  Patient will have Sunday off with intention for fourth dialysis treatment on Monday -So far tolerated treatment without problems on 12/07/2022.  Will continue to follow nephrology service recommendations. -Continue to closely follow daily weights, volume status and response. -Continue to follow nephrology service recommendation; appreciate their assistance in care.   Left lower extremity cellulitis/wound -Continue to follow wound care as recommended by wound care service.   -Continue current antibiotics with Rocephin and doxycycline -DVT evaluation negative.   Elevated BNP -Does not appear to be related to acute CHF -Likely elevated in the setting of AKI on CKD stage IV-V patient a transition point currently based on GFR. -Continue to follow daily weights and strict I's and O's. -At the moment we will continue volume management with dialysis treatments. -Continue low-sodium diet.   Anemia of CKD -Receives Retacrit injections outpatient with last dose 8/5 -No overt bleeding appreciated. -Continue to monitor with repeat CBC, no overt bleeding noted -Transfuse for hemoglobin less than 7 -Dose of Aranesp 60 mcg per nephrology -Further treatment with Epogen as per nephrology discretion. -Follow hemoglobin trend. -No overt bleeding reported or appreciated.   DM2 with mild hyperglycemia/hypoglycemia -Continue on carb modified diet -Continue to follow CBGs fluctuation and adjust hypoglycemic regimen as needed. -Now the patient has been started on dialysis mild hypoglycemic events appreciated; insulin pump has been discontinue and the sliding scale insulin along with low-dose Semglee has been started.   Hypertension -Continue home antihypertensive agents -Continue as needed hydralazine -Follow vital signs -Anticipate improvement with dialysis -Continue to follow vital  signs.   Dyslipidemia -Continue atorvastatin -Heart healthy diet discussed  with patient.   PAF -Eliquis on hold until dialysis catheter placed 12/07/2022; anticipate ability to resume anticoagulation after. -Continue metoprolol -Normal sinus rhythm per telemetry evaluation. -Continue to follow electrolytes.   HFpEF Continue metoprolol Repeat 2D echocardiogram with previous 03/2021 with LVEF 65-70% with grade 1 diastolic dysfunction and moderate LVH Noted to have elevated BNP, but more likely related to significant renal dysfunction. -At this moment after discussing with nephrologist and following further deterioration in her renal function plan is to initiate dialysis -Dialysis catheter has been placed and ready to use (appreciate assistance and care by Dr. Henreitta Leber).  Hypokalemia -Continue to have electrolytes repleted throughout hemodialysis -Potassium 2.6; mild IV supplementation provided -Continue to follow electrolytes trend and telemetry monitoring.   DVT prophylaxis: Eliquis to be resumed once safe after dialysis catheter in place.  Not on SCDs given left lower extremity wound. Code Status: Full Family Communication: Daughter at bedside 8/28 Disposition Plan:  Status is: Inpatient  Remains inpatient appropriate because: Need for IV antibiotics and inpatient procedures.   Consultants:  Nephrology Palliative care  Procedures:  Ultrasound thoracentesis pending 8/27  Antimicrobials:  Anti-infectives (From admission, onward)    Start     Dose/Rate Route Frequency Ordered Stop   12/07/22 1045  ceFAZolin (ANCEF) IVPB 2g/100 mL premix  Status:  Discontinued        2 g 200 mL/hr over 30 Minutes Intravenous On call to O.R. 12/07/22 1610 12/07/22 1333   12/05/22 2200  doxycycline (VIBRAMYCIN) 100 mg in sodium chloride 0.9 % 250 mL IVPB        100 mg 125 mL/hr over 120 Minutes Intravenous Every 12 hours 12/04/22 1526     12/05/22 1500  cefTRIAXone (ROCEPHIN) 1 g in sodium  chloride 0.9 % 100 mL IVPB        1 g 200 mL/hr over 30 Minutes Intravenous Every 24 hours 12/04/22 1526     12/04/22 1345  cefTRIAXone (ROCEPHIN) 1 g in sodium chloride 0.9 % 100 mL IVPB        1 g 200 mL/hr over 30 Minutes Intravenous  Once 12/04/22 1335 12/04/22 1537   12/04/22 1345  doxycycline (VIBRA-TABS) tablet 100 mg        100 mg Oral  Once 12/04/22 1335 12/04/22 1507      Subjective: No chest pain, no vomiting.  2 L nasal cannula supplementation in place.  Weak and deconditioned.  So far has tolerated dialysis twice.  Experiencing some nausea this morning.  Objective: Vitals:   12/09/22 1438 12/09/22 1440 12/09/22 1445 12/09/22 1500  BP: (!) 153/67   (!) 150/53  Pulse:      Resp: 17 17 14 18   Temp:      TempSrc:      SpO2:    100%  Weight:      Height:        Intake/Output Summary (Last 24 hours) at 12/09/2022 1521 Last data filed at 12/09/2022 1300 Gross per 24 hour  Intake 120 ml  Output 1000 ml  Net -880 ml   Filed Weights   12/04/22 1224 12/07/22 1033 12/07/22 1900  Weight: 64.4 kg 64.4 kg 64.5 kg    Examination: General exam: Somnolent after receiving medication for nausea; family report decreased oral intake.  No chest pain, 2 L nasal cannula in place.  Generally weak and deconditioned. Respiratory system: Scattered rhonchi; decreased breath sounds at the bases.  No using accessory muscles. Cardiovascular system:RRR.  No rubs, no gallops, no JVD.  Right tunneled hemodialysis catheter in place. Gastrointestinal system: Abdomen is nondistended, soft and nontender. No organomegaly or masses felt. Normal bowel sounds heard. Central nervous system: No focal neurological deficits. Extremities: No cyanosis or clubbing; no edema.  Left lower extremity with clean dressings in place.  Less tenderness anterior wound appreciated. Skin: No petechiae. Psychiatry: Following commands appropriately.  Flat affect.  Limited examination as she was somnolent on today's exam.     Data Reviewed: I have personally reviewed following labs and imaging studies  CBC: Recent Labs  Lab 12/05/22 0427 12/06/22 0404 12/07/22 2000 12/08/22 0447 12/09/22 0410  WBC 10.1 10.6* 11.4* 14.7* 18.0*  HGB 8.6* 8.3* 8.1* 8.8* 9.0*  HCT 26.1* 25.4* 24.4* 26.6* 27.4*  MCV 89.1 89.1 88.7 87.8 89.0  PLT 419* 417* 436* 421* 446*   Basic Metabolic Panel: Recent Labs  Lab 12/04/22 1225 12/05/22 0427 12/06/22 0404 12/07/22 2000 12/08/22 0447 12/09/22 0410  NA 135 135 136 139 137 136  K 4.2 4.0 3.5 2.9* 2.6* 3.1*  CL 107 108 108 104 103 99  CO2 11* 12* 13* 21* 26 26  GLUCOSE 150* 106* 140* 139* 83 113*  BUN 105* 97* 98* 84* 59* 38*  CREATININE 10.03* 9.97* 9.81* 8.82* 6.38* 4.62*  CALCIUM 8.7* 8.7* 8.3* 7.7* 7.7* 7.8*  MG 3.2* 3.0* 2.8*  --   --   --   PHOS  --   --   --  6.7* 5.1* 4.0   GFR: Estimated Creatinine Clearance: 8 mL/min (A) (by C-G formula based on SCr of 4.62 mg/dL (H)).  CBG: Recent Labs  Lab 12/08/22 1404 12/08/22 2029 12/09/22 0304 12/09/22 0726 12/09/22 1131  GLUCAP 129* 101* 117* 146* 147*   Sepsis Labs: Recent Labs  Lab 12/05/22 1418  PROCALCITON 0.35    Recent Results (from the past 240 hour(s))  Resp panel by RT-PCR (RSV, Flu A&B, Covid) Anterior Nasal Swab     Status: None   Collection Time: 12/04/22 12:26 PM   Specimen: Anterior Nasal Swab  Result Value Ref Range Status   SARS Coronavirus 2 by RT PCR NEGATIVE NEGATIVE Final    Comment: (NOTE) SARS-CoV-2 target nucleic acids are NOT DETECTED.  The SARS-CoV-2 RNA is generally detectable in upper respiratory specimens during the acute phase of infection. The lowest concentration of SARS-CoV-2 viral copies this assay can detect is 138 copies/mL. A negative result does not preclude SARS-Cov-2 infection and should not be used as the sole basis for treatment or other patient management decisions. A negative result may occur with  improper specimen collection/handling, submission of  specimen other than nasopharyngeal swab, presence of viral mutation(s) within the areas targeted by this assay, and inadequate number of viral copies(<138 copies/mL). A negative result must be combined with clinical observations, patient history, and epidemiological information. The expected result is Negative.  Fact Sheet for Patients:  BloggerCourse.com  Fact Sheet for Healthcare Providers:  SeriousBroker.it  This test is no t yet approved or cleared by the [place] FDA and  has been authorized for detection and/or diagnosis of SARS-CoV-2 by FDA under an Emergency Use Authorization (EUA). This EUA will remain  in effect (meaning this test can be used) for the duration of the COVID-19 declaration under Section 564(b)(1) of the Act, 21 U.S.C.section 360bbb-3(b)(1), unless the authorization is terminated  or revoked sooner.       Influenza A by PCR NEGATIVE NEGATIVE Final   Influenza B by PCR NEGATIVE NEGATIVE Final    Comment: (NOTE)  The Xpert Xpress SARS-CoV-2/FLU/RSV plus assay is intended as an aid in the diagnosis of influenza from Nasopharyngeal swab specimens and should not be used as a sole basis for treatment. Nasal washings and aspirates are unacceptable for Xpert Xpress SARS-CoV-2/FLU/RSV testing.  Fact Sheet for Patients: BloggerCourse.com  Fact Sheet for Healthcare Providers: SeriousBroker.it  This test is not yet approved or cleared by the [place] FDA and has been authorized for detection and/or diagnosis of SARS-CoV-2 by FDA under an Emergency Use Authorization (EUA). This EUA will remain in effect (meaning this test can be used) for the duration of the COVID-19 declaration under Section 564(b)(1) of the Act, 21 U.S.C. section 360bbb-3(b)(1), unless the authorization is terminated or revoked.     Resp Syncytial Virus by PCR NEGATIVE NEGATIVE Final     Comment: (NOTE) Fact Sheet for Patients: BloggerCourse.com  Fact Sheet for Healthcare Providers: SeriousBroker.it  This test is not yet approved or cleared by the [place] FDA and has been authorized for detection and/or diagnosis of SARS-CoV-2 by FDA under an Emergency Use Authorization (EUA). This EUA will remain in effect (meaning this test can be used) for the duration of the COVID-19 declaration under Section 564(b)(1) of the Act, 21 U.S.C. section 360bbb-3(b)(1), unless the authorization is terminated or revoked.  Performed at [facility], [address], [place], [place] [address]   Gram stain     Status: None   Collection Time: 12/06/22  9:40 AM   Specimen: Pleura  Result Value Ref Range Status   Specimen Description PLEURAL  Final   Special Requests NONE  Final   Gram Stain   Final    CYTOSPIN SMEAR NO ORGANISMS SEEN WBC PRESENT,BOTH PMN AND MONONUCLEAR Performed at [facility], [address] [place], [place] [address]    Report Status 12/06/2022 FINAL  Final  Culture, body fluid w Gram Stain-bottle     Status: None (Preliminary result)   Collection Time: 12/06/22  9:40 AM   Specimen: Pleura  Result Value Ref Range Status   Specimen Description PLEURAL  Final   Special Requests 10CC BOTTLES DRAWN AEROBIC AND ANAEROBIC  Final   Culture   Final    NO GROWTH 3 DAYS Performed at [facility], [address] [place], [place] [address]    Report Status PENDING  Incomplete  Surgical PCR screen     Status: None   Collection Time: 12/07/22  3:07 AM   Specimen: Nasal Mucosa; Nasal Swab  Result Value Ref Range Status   MRSA, PCR NEGATIVE NEGATIVE Final   Staphylococcus aureus NEGATIVE NEGATIVE Final    Comment: (NOTE) The Xpert SA Assay (FDA approved for NASAL specimens in patients 51 years of age and older), is one component of a comprehensive surveillance program. It is not intended to diagnose infection nor  to guide or monitor treatment. Performed at [facility], [address] [place], [place] [address]     Radiology Studies: No results found.  Scheduled Meds:  amLODipine  5 mg Oral Daily   atorvastatin  20 mg Oral Daily   budesonide (PULMICORT) nebulizer solution  0.25 mg Nebulization BID   Chlorhexidine Gluconate Cloth  6 each Topical Q0600   famotidine  20 mg Oral QHS   fluticasone  1 spray Each Nare Daily   hydrALAZINE  25 mg Oral TID   insulin aspart  0-6 Units Subcutaneous TID WC   insulin glargine-yfgn  5 Units Subcutaneous Daily   isosorbide mononitrate  60 mg Oral Daily  latanoprost  1 drop Both Eyes QHS   loratadine  10 mg Oral Daily   metoprolol succinate  12.5 mg Oral Daily   mupirocin ointment  1 Application Nasal BID   Continuous Infusions:  cefTRIAXone (ROCEPHIN)  IV 1 g (12/08/22 1701)   doxycycline (VIBRAMYCIN) IV 100 mg (12/09/22 1006)     LOS: 5 days   Time spent: 45 minutes  Camdon Saetern,MD [facility]  If 7PM-7AM, please contact night-coverage [email or web address] 12/09/2022, 3:21 PM

## 2022-12-09 NOTE — Plan of Care (Signed)
  Problem: Acute Rehab PT Goals(only PT should resolve) Goal: Pt will Roll Supine to Side Flowsheets (Taken 12/09/2022 1114) Pt will Roll Supine to Side: with min assist Goal: Pt Will Go Sit To Supine/Side Flowsheets (Taken 12/09/2022 1114) Pt will go Sit to Supine/Side: with minimal assist Goal: Pt Will Transfer Bed To Chair/Chair To Bed Flowsheets (Taken 12/09/2022 1114) Pt will Transfer Bed to Chair/Chair to Bed: with mod assist Goal: Pt Will Ambulate Flowsheets (Taken 12/09/2022 1114) Pt will Ambulate:  10 feet  with minimal assist  with rolling walker

## 2022-12-09 NOTE — Progress Notes (Signed)
  HEMODIALYSIS TREATMENT NOTE (HD#3):   Fairly sleepy the last two HD sessions.  Today, she keeps eyes closed, responds to voice, and is restless.  She repeats "Oh.... Lynden Oxford.... Lord have mercy.... gimme something.... help me" but will not elaborate specific needs.  She will not answer orientation questions.  She will not answer yes/no questions.  With enough prompting will make eye contact but not for long.  She is restless, moving all extremities, and able to turn to her side.  Seems unable to make her needs known.    Pre-HD axillary temp 99.2, unremarkable cath exit site, leukocytosis (r/t Dr. Gwenlyn Perking).  3 hour treatment completed.  Restless throughout.  Goal met: 1L removed without interruption in UF.  Unable to maintain prescribed catheter flow rate due to pt movement triggering machine/stop alarms. She holds (but does not pull on) the bloodlines and keeps removing pulse ox probe.   Not sure how well she will tolerate outpatient HD in a recliner and without family present, especially considering outpatient HD 4:1 patient/staff ratios.  Post-HD:  12/09/22 1803  Vitals  Temp 97.9 F (36.6 C)  Temp Source Axillary  BP (!) 178/77  MAP (mmHg) 105  BP Location Left Arm  BP Method Automatic  Patient Position (if appropriate) Lying  Pulse Rate 98  Pulse Rate Source Monitor  ECG Heart Rate 98  Resp 16  Oxygen Therapy  SpO2 100 %  O2 Device Nasal Cannula  O2 Flow Rate (L/min) 2 L/min  Post Treatment  Dialyzer Clearance Lightly streaked  Hemodialysis Intake (mL) 0 mL  Liters Processed 40  Fluid Removed (mL) 1000 mL  Tolerated HD Treatment Yes  Post-Hemodialysis Comments Goal met  Hemodialysis Catheter Right Internal jugular Double lumen Permanent (Tunneled)  Placement Date/Time: 12/07/22 1215   Placed prior to admission: No  Serial / Lot #: [id number]  Expiration Date: 10/15/25  Time Out: Correct patient;Correct site;Correct procedure  Maximum sterile barrier precautions: Hand  hygiene;Cap;Mask;Sterile gow...  Blue Lumen Status Flushed;Heparin locked;Dead end cap in place  Red Lumen Status Flushed;Heparin locked;Dead end cap in place  Purple Lumen Status N/A  Catheter fill solution Heparin 1000 units/ml  Catheter fill volume (Arterial) 1.6 cc  Catheter fill volume (Venous) 1.6  Dressing Type Transparent;Tube stabilization device  Dressing Status Antimicrobial disc in place;Clean, Dry, Intact  Drainage Description None  Dressing Change Due 12/14/22  Post treatment catheter status Capped and Clamped     Jennifier Smitherman,RN AP [facility]

## 2022-12-10 DIAGNOSIS — J189 Pneumonia, unspecified organism: Secondary | ICD-10-CM | POA: Diagnosis not present

## 2022-12-10 DIAGNOSIS — J9601 Acute respiratory failure with hypoxia: Secondary | ICD-10-CM | POA: Diagnosis not present

## 2022-12-10 DIAGNOSIS — S81802D Unspecified open wound, left lower leg, subsequent encounter: Secondary | ICD-10-CM | POA: Diagnosis not present

## 2022-12-10 DIAGNOSIS — E782 Mixed hyperlipidemia: Secondary | ICD-10-CM | POA: Diagnosis not present

## 2022-12-10 LAB — RENAL FUNCTION PANEL
Albumin: 2.1 g/dL — ABNORMAL LOW (ref 3.5–5.0)
Anion gap: 11 (ref 5–15)
BUN: 30 mg/dL — ABNORMAL HIGH (ref 8–23)
CO2: 27 mmol/L (ref 22–32)
Calcium: 8.1 mg/dL — ABNORMAL LOW (ref 8.9–10.3)
Chloride: 98 mmol/L (ref 98–111)
Creatinine, Ser: 3.62 mg/dL — ABNORMAL HIGH (ref 0.44–1.00)
GFR, Estimated: 12 mL/min — ABNORMAL LOW (ref >60)
Glucose, Bld: 138 mg/dL — ABNORMAL HIGH (ref 70–99)
Phosphorus: 3.9 mg/dL (ref 2.5–4.6)
Potassium: 3.7 mmol/L (ref 3.5–5.1)
Sodium: 136 mmol/L (ref 135–145)

## 2022-12-10 LAB — GLUCOSE, CAPILLARY
Glucose-Capillary: 119 mg/dL — ABNORMAL HIGH (ref 70–99)
Glucose-Capillary: 139 mg/dL — ABNORMAL HIGH (ref 70–99)
Glucose-Capillary: 119 mg/dL — ABNORMAL HIGH (ref 70–99)
Glucose-Capillary: 177 mg/dL — ABNORMAL HIGH (ref 70–99)
Glucose-Capillary: 162 mg/dL — ABNORMAL HIGH (ref 70–99)

## 2022-12-10 MED ORDER — APIXABAN 2.5 MG PO TABS
2.5000 mg | ORAL_TABLET | Freq: Two times a day (BID) | ORAL | Status: DC
  Administered 2022-12-10 – 2022-12-14 (×8): 2.5 mg via ORAL
  Filled 2022-12-10 (×8): qty 1

## 2022-12-10 MED ORDER — CHLORHEXIDINE GLUCONATE CLOTH 2 % EX PADS: 6.0000 | MEDICATED_PAD | Freq: Every day | CUTANEOUS | Status: DC

## 2022-12-10 NOTE — Progress Notes (Signed)
PROGRESS NOTE    [name] [name]  ZOX:[id number] DOB: [birth year] DOA: 12/04/2022 PCP: Dorothyann Peng, MD   Brief Narrative:    [name] [name] is a 82 y.o. female with medical history significant for HFpEF, type 2 diabetes insulin-dependent, CKD stage IV, hypertension, dyslipidemia, chronic anemia of CKD, and prior CVA who presented to the ED with worsening shortness of breath as well as dry cough that began last night.  Patient was admitted with acute hypoxemic respiratory failure-multifactorial in the setting of volume overload as well as suspected pneumonia.  She is also noted to have worsening AKI on CKD stage IV and nephrology recommending hemodialysis, family now appears agreeable.  She is noted to have pleural effusion and will undergo thoracentesis today.  Anticipate hemodialysis initiation in the next 1-2 days.  Assessment & Plan:   Principal Problem:   Acute hypoxemic respiratory failure (HCC) Active Problems:   Chronic kidney disease (CKD), stage IV (severe) (HCC)   Essential hypertension   Mixed hyperlipidemia   Anemia due to chronic kidney disease   Diabetic retinopathy associated with type 2 diabetes mellitus (HCC)   Wound of left leg   End stage renal disease (HCC)  Assessment and Plan:  Acute hypoxemic respiratory failure-likely related to possible pneumonia -Off BiPAP; status post thoracentesis. -Left lower lobe atelectasis versus effusion with possible pneumonia. -Continue Rocephin and doxycycline for now and check procalcitonin -Scheduled breathing treatments, flutter valve and incentive spirometry has been encouraged. -Continue to wean off oxygen supplementation as needed. -Planning for a 3 more days of antibiotics.   AKI on CKD stage IV to V with metabolic acidosis Poor oral intake over the last 1 week noted Family now appears agreeable for hemodialysis and this will be initiated per nephrology when appropriate -Status post dialysis catheter placement;  and back-to-back initiation treatments (8/29>>8/30>>8/31); social worker aware to assist with outpatient dialysis chair.   -Patient will have Sunday off with intention for fourth dialysis treatment on Monday -So far tolerated treatment without problems on 12/07/2022.  Will continue to follow nephrology service recommendations. -Continue to closely follow daily weights, volume status and response. -Continue to follow nephrology service recommendation; appreciate their assistance in care.   Left lower extremity cellulitis/wound -Continue to follow wound care as recommended by wound care service.   -Continue current antibiotics with Rocephin and doxycycline -DVT evaluation negative.   Elevated BNP -Does not appear to be related to acute CHF -Likely elevated in the setting of AKI on CKD stage IV-V patient a transition point currently based on GFR. -Continue to follow daily weights and strict I's and O's. -At the moment we will continue volume management with dialysis treatments. -Continue low-sodium diet.   Anemia of CKD -Receives Retacrit injections outpatient with last dose 8/5 -No overt bleeding appreciated. -Continue to monitor with repeat CBC, no overt bleeding noted -Transfuse for hemoglobin less than 7 -Dose of Aranesp 60 mcg per nephrology -Further treatment with Epogen as per nephrology discretion. -Continue to follow hemoglobin trend. -No overt bleeding reported or appreciated.   DM2 with mild hyperglycemia/hypoglycemia -Continue on carb modified diet -Continue to follow CBGs fluctuation and adjust hypoglycemic regimen as needed. -Now the patient has been started on dialysis mild hypoglycemic events appreciated; insulin pump has been discontinue and the sliding scale insulin along with low-dose Semglee has been started.   Hypertension -Continue home antihypertensive agents -Continue as needed hydralazine -Follow vital signs -Anticipate improvement with dialysis -Continue to  follow vital signs.   Dyslipidemia -Continue atorvastatin -Heart  healthy diet discussed with patient.   PAF -Continue Eliquis for secondary prevention. -Continue metoprolol -Normal sinus rhythm per telemetry evaluation. -Continue to follow electrolytes.   HFpEF Continue metoprolol Repeat 2D echocardiogram with previous 03/2021 with LVEF 65-70% with grade 1 diastolic dysfunction and moderate LVH Noted to have elevated BNP, but more likely related to significant renal dysfunction. -At this moment after discussing with nephrologist and following further deterioration in her renal function plan is to initiate dialysis -Dialysis catheter has been placed and ready to use (appreciate assistance and care by Dr. Henreitta Leber). -Continue volume management with dialysis treatment.  Hypokalemia -Continue to have electrolytes repleted throughout hemodialysis -Potassium will continue to be stabilized with hemodialysis treatment; currently 3.7.  Presumed dysphagia -Speech therapy has been consulted and will follow recommendations.   DVT prophylaxis: Continue Eliquis. Code Status: Full Family Communication: Daughter at bedside 8/28 Disposition Plan:  Status is: Inpatient  Remains inpatient appropriate because: Need for IV antibiotics and inpatient procedures.   Consultants:  Nephrology Palliative care  Procedures:  Ultrasound thoracentesis pending 8/27  Antimicrobials:  Anti-infectives (From admission, onward)    Start     Dose/Rate Route Frequency Ordered Stop   12/07/22 1045  ceFAZolin (ANCEF) IVPB 2g/100 mL premix  Status:  Discontinued        2 g 200 mL/hr over 30 Minutes Intravenous On call to O.R. 12/07/22 5366 12/07/22 1333   12/05/22 2200  doxycycline (VIBRAMYCIN) 100 mg in sodium chloride 0.9 % 250 mL IVPB        100 mg 125 mL/hr over 120 Minutes Intravenous Every 12 hours 12/04/22 1526     12/05/22 1500  cefTRIAXone (ROCEPHIN) 1 g in sodium chloride 0.9 % 100 mL IVPB         1 g 200 mL/hr over 30 Minutes Intravenous Every 24 hours 12/04/22 1526     12/04/22 1345  cefTRIAXone (ROCEPHIN) 1 g in sodium chloride 0.9 % 100 mL IVPB        1 g 200 mL/hr over 30 Minutes Intravenous  Once 12/04/22 1335 12/04/22 1537   12/04/22 1345  doxycycline (VIBRA-TABS) tablet 100 mg        100 mg Oral  Once 12/04/22 1335 12/04/22 1507      Subjective: No fever; no chest pain, no nausea, no vomiting.  Patient with low-grade temperature yesterday evening while having dialysis.  Nurses expressed concerns on patient's capability for safe swallowing.  Objective: Vitals:   12/09/22 2044 12/09/22 2126 12/10/22 0603 12/10/22 1345  BP:  (!) 161/75 (!) 174/68 (!) 136/57  Pulse:  83 79 77  Resp:  17  18  Temp:  98.3 F (36.8 C) 98.1 F (36.7 C) 98.8 F (37.1 C)  TempSrc:  Oral Oral Oral  SpO2: 92% 96% 93% 96%  Weight:      Height:        Intake/Output Summary (Last 24 hours) at 12/10/2022 1727 Last data filed at 12/10/2022 1245 Gross per 24 hour  Intake 180 ml  Output 1600 ml  Net -1420 ml   Filed Weights   12/04/22 1224 12/07/22 1033 12/07/22 1900  Weight: 64.4 kg 64.4 kg 64.5 kg    Examination: General exam: Sleepy, but easily aroused and answering questions appropriately.  2 L supplementation in place.  No fever, no chest pain, no nausea, no vomiting.  Patient reports no abdominal pain. Respiratory system: Positive scattered rhonchi; no wheezing or crackles.  No using accessory muscles. Cardiovascular system:RRR. No rubs or gallops; no  JVD. Gastrointestinal system: Abdomen is nondistended, soft and nontender. No organomegaly or masses felt. Normal bowel sounds heard. Central nervous system: No focal neurological deficits. Extremities: No cyanosis or clubbing; no edema appreciated.  Left lower extremity anterior shin with clean dressings in place. Skin: No petechiae. Psychiatry: Judgement and insight appear normal.  Flat affect appreciated.  Data Reviewed: I have  personally reviewed following labs and imaging studies  CBC: Recent Labs  Lab 12/05/22 0427 12/06/22 0404 12/07/22 2000 12/08/22 0447 12/09/22 0410  WBC 10.1 10.6* 11.4* 14.7* 18.0*  HGB 8.6* 8.3* 8.1* 8.8* 9.0*  HCT 26.1* 25.4* 24.4* 26.6* 27.4*  MCV 89.1 89.1 88.7 87.8 89.0  PLT 419* 417* 436* 421* 446*   Basic Metabolic Panel: Recent Labs  Lab 12/04/22 1225 12/05/22 0427 12/06/22 0404 12/07/22 2000 12/08/22 0447 12/09/22 0410 12/10/22 0429  NA 135 135 136 139 137 136 136  K 4.2 4.0 3.5 2.9* 2.6* 3.1* 3.7  CL 107 108 108 104 103 99 98  CO2 11* 12* 13* 21* 26 26 27   GLUCOSE 150* 106* 140* 139* 83 113* 138*  BUN 105* 97* 98* 84* 59* 38* 30*  CREATININE 10.03* 9.97* 9.81* 8.82* 6.38* 4.62* 3.62*  CALCIUM 8.7* 8.7* 8.3* 7.7* 7.7* 7.8* 8.1*  MG 3.2* 3.0* 2.8*  --   --   --   --   PHOS  --   --   --  6.7* 5.1* 4.0 3.9   GFR: Estimated Creatinine Clearance: 10.2 mL/min (A) (by C-G formula based on SCr of 3.62 mg/dL (H)).  CBG: Recent Labs  Lab 12/09/22 2123 12/10/22 0310 12/10/22 0725 12/10/22 1102 12/10/22 1649  GLUCAP 100* 119* 139* 119* 177*   Sepsis Labs: Recent Labs  Lab 12/05/22 1418  PROCALCITON 0.35    Recent Results (from the past 240 hour(s))  Resp panel by RT-PCR (RSV, Flu A&B, Covid) Anterior Nasal Swab     Status: None   Collection Time: 12/04/22 12:26 PM   Specimen: Anterior Nasal Swab  Result Value Ref Range Status   SARS Coronavirus 2 by RT PCR NEGATIVE NEGATIVE Final    Comment: (NOTE) SARS-CoV-2 target nucleic acids are NOT DETECTED.  The SARS-CoV-2 RNA is generally detectable in upper respiratory specimens during the acute phase of infection. The lowest concentration of SARS-CoV-2 viral copies this assay can detect is 138 copies/mL. A negative result does not preclude SARS-Cov-2 infection and should not be used as the sole basis for treatment or other patient management decisions. A negative result may occur with  improper specimen  collection/handling, submission of specimen other than nasopharyngeal swab, presence of viral mutation(s) within the areas targeted by this assay, and inadequate number of viral copies(<138 copies/mL). A negative result must be combined with clinical observations, patient history, and epidemiological information. The expected result is Negative.  Fact Sheet for Patients:  BloggerCourse.com  Fact Sheet for Healthcare Providers:  SeriousBroker.it  This test is no t yet approved or cleared by the [place] FDA and  has been authorized for detection and/or diagnosis of SARS-CoV-2 by FDA under an Emergency Use Authorization (EUA). This EUA will remain  in effect (meaning this test can be used) for the duration of the COVID-19 declaration under Section 564(b)(1) of the Act, 21 U.S.C.section 360bbb-3(b)(1), unless the authorization is terminated  or revoked sooner.       Influenza A by PCR NEGATIVE NEGATIVE Final   Influenza B by PCR NEGATIVE NEGATIVE Final    Comment: (NOTE) The Xpert Xpress  SARS-CoV-2/FLU/RSV plus assay is intended as an aid in the diagnosis of influenza from Nasopharyngeal swab specimens and should not be used as a sole basis for treatment. Nasal washings and aspirates are unacceptable for Xpert Xpress SARS-CoV-2/FLU/RSV testing.  Fact Sheet for Patients: BloggerCourse.com  Fact Sheet for Healthcare Providers: SeriousBroker.it  This test is not yet approved or cleared by the [place] FDA and has been authorized for detection and/or diagnosis of SARS-CoV-2 by FDA under an Emergency Use Authorization (EUA). This EUA will remain in effect (meaning this test can be used) for the duration of the COVID-19 declaration under Section 564(b)(1) of the Act, 21 U.S.C. section 360bbb-3(b)(1), unless the authorization is terminated or revoked.     Resp Syncytial  Virus by PCR NEGATIVE NEGATIVE Final    Comment: (NOTE) Fact Sheet for Patients: BloggerCourse.com  Fact Sheet for Healthcare Providers: SeriousBroker.it  This test is not yet approved or cleared by the [place] FDA and has been authorized for detection and/or diagnosis of SARS-CoV-2 by FDA under an Emergency Use Authorization (EUA). This EUA will remain in effect (meaning this test can be used) for the duration of the COVID-19 declaration under Section 564(b)(1) of the Act, 21 U.S.C. section 360bbb-3(b)(1), unless the authorization is terminated or revoked.  Performed at [facility], [address], [place], [place] [address]   Gram stain     Status: None   Collection Time: 12/06/22  9:40 AM   Specimen: Pleura  Result Value Ref Range Status   Specimen Description PLEURAL  Final   Special Requests NONE  Final   Gram Stain   Final    CYTOSPIN SMEAR NO ORGANISMS SEEN WBC PRESENT,BOTH PMN AND MONONUCLEAR Performed at [facility], [address] [place], [place] [address]    Report Status 12/06/2022 FINAL  Final  Culture, body fluid w Gram Stain-bottle     Status: None (Preliminary result)   Collection Time: 12/06/22  9:40 AM   Specimen: Pleura  Result Value Ref Range Status   Specimen Description PLEURAL  Final   Special Requests 10CC BOTTLES DRAWN AEROBIC AND ANAEROBIC  Final   Culture   Final    NO GROWTH 4 DAYS Performed at [facility], [address] [place], [place] [address]    Report Status PENDING  Incomplete  Surgical PCR screen     Status: None   Collection Time: 12/07/22  3:07 AM   Specimen: Nasal Mucosa; Nasal Swab  Result Value Ref Range Status   MRSA, PCR NEGATIVE NEGATIVE Final   Staphylococcus aureus NEGATIVE NEGATIVE Final    Comment: (NOTE) The Xpert SA Assay (FDA approved for NASAL specimens in patients 14 years of age and older), is one component of a comprehensive surveillance program. It is  not intended to diagnose infection nor to guide or monitor treatment. Performed at [facility], [address], [place], [place] [address]     Radiology Studies: No results found.  Scheduled Meds:  amLODipine  5 mg Oral Daily   atorvastatin  20 mg Oral Daily   budesonide (PULMICORT) nebulizer solution  0.25 mg Nebulization BID   Chlorhexidine Gluconate Cloth  6 each Topical Q0600   famotidine  20 mg Oral QHS   fluticasone  1 spray Each Nare Daily   hydrALAZINE  25 mg Oral TID   insulin aspart  0-6 Units Subcutaneous TID WC   insulin glargine-yfgn  5 Units Subcutaneous Daily   isosorbide mononitrate  60 mg Oral Daily   latanoprost  1 drop Both Eyes QHS   loratadine  10 mg Oral Daily   metoprolol succinate  12.5 mg Oral Daily   mupirocin ointment  1 Application Nasal BID   Continuous Infusions:  cefTRIAXone (ROCEPHIN)  IV 1 g (12/10/22 1533)   doxycycline (VIBRAMYCIN) IV 100 mg (12/10/22 1051)     LOS: 6 days   Time spent: 45 minutes  Burgess Sheriff,MD [facility]  If 7PM-7AM, please contact night-coverage [email or web address] 12/10/2022, 5:27 PM

## 2022-12-10 NOTE — Progress Notes (Signed)
After telling the patients family not to feed her while she was asleep, that we can order her a new tray, they tried to feed her and called me to the room when they thought she was choking, patient was not choking when I came to the room but warned family of aspiration risk. Patient fell back asleep and nurse again asked family not to give her food until she is fully awake.

## 2022-12-10 DEATH — deceased

## 2022-12-11 ENCOUNTER — Inpatient Hospital Stay (HOSPITAL_COMMUNITY): Payer: Medicare Other

## 2022-12-11 DIAGNOSIS — J189 Pneumonia, unspecified organism: Secondary | ICD-10-CM | POA: Diagnosis not present

## 2022-12-11 DIAGNOSIS — S81802D Unspecified open wound, left lower leg, subsequent encounter: Secondary | ICD-10-CM | POA: Diagnosis not present

## 2022-12-11 DIAGNOSIS — E782 Mixed hyperlipidemia: Secondary | ICD-10-CM | POA: Diagnosis not present

## 2022-12-11 DIAGNOSIS — J9601 Acute respiratory failure with hypoxia: Secondary | ICD-10-CM | POA: Diagnosis not present

## 2022-12-11 LAB — CULTURE, BODY FLUID W GRAM STAIN -BOTTLE: Culture: NO GROWTH

## 2022-12-11 LAB — GLUCOSE, CAPILLARY
Glucose-Capillary: 183 mg/dL — ABNORMAL HIGH (ref 70–99)
Glucose-Capillary: 178 mg/dL — ABNORMAL HIGH (ref 70–99)
Glucose-Capillary: 207 mg/dL — ABNORMAL HIGH (ref 70–99)
Glucose-Capillary: 225 mg/dL — ABNORMAL HIGH (ref 70–99)
Glucose-Capillary: 190 mg/dL — ABNORMAL HIGH (ref 70–99)

## 2022-12-11 LAB — RENAL FUNCTION PANEL
Albumin: 2 g/dL — ABNORMAL LOW (ref 3.5–5.0)
Anion gap: 13 (ref 5–15)
BUN: 42 mg/dL — ABNORMAL HIGH (ref 8–23)
CO2: 24 mmol/L (ref 22–32)
Calcium: 8.5 mg/dL — ABNORMAL LOW (ref 8.9–10.3)
Chloride: 99 mmol/L (ref 98–111)
Creatinine, Ser: 4.43 mg/dL — ABNORMAL HIGH (ref 0.44–1.00)
GFR, Estimated: 9 mL/min — ABNORMAL LOW (ref >60)
Glucose, Bld: 177 mg/dL — ABNORMAL HIGH (ref 70–99)
Phosphorus: 5.4 mg/dL — ABNORMAL HIGH (ref 2.5–4.6)
Potassium: 3.7 mmol/L (ref 3.5–5.1)
Sodium: 136 mmol/L (ref 135–145)

## 2022-12-11 MED ORDER — DARBEPOETIN ALFA 60 MCG/0.3ML IJ SOSY
60.0000 ug | PREFILLED_SYRINGE | INTRAMUSCULAR | Status: DC
  Administered 2022-12-12: 60 ug via SUBCUTANEOUS
  Filled 2022-12-11 (×2): qty 0.3

## 2022-12-11 MED ORDER — MELATONIN 3 MG PO TABS
6.0000 mg | ORAL_TABLET | Freq: Once | ORAL | Status: AC
  Administered 2022-12-11: 6 mg via ORAL
  Filled 2022-12-11: qty 2

## 2022-12-11 MED ORDER — DARBEPOETIN ALFA 60 MCG/0.3ML IJ SOSY
60.0000 ug | PREFILLED_SYRINGE | INTRAMUSCULAR | Status: DC
  Filled 2022-12-11: qty 0.3

## 2022-12-11 MED ORDER — POTASSIUM CHLORIDE CRYS ER 20 MEQ PO TBCR
40.0000 meq | EXTENDED_RELEASE_TABLET | Freq: Once | ORAL | Status: AC
  Administered 2022-12-12: 40 meq via ORAL
  Filled 2022-12-11: qty 2

## 2022-12-11 NOTE — Progress Notes (Addendum)
[name] [name] PROGRESS NOTE  Assessment/ Plan: Pt is a 82 y.o. yo female with past medical history significant for hypertension, HLD, type II DM, CHF with preserved EF, CKD 4, anemia, stroke presented with shortness of breath, seen as a consultation for renal failure.  # Acute hypoxic respiratory failure presented due to pneumonia and fluid overload.  Status post thoracocentesis with 700 cc on 8/28 with some improvement.  Now UF with HD.  # CKD 5 progressed to new ESRD: After discussion with the family the patient was started on dialysis via [facility].  She had TDC placed on 12/07/2022 and received first dialysis on the same day.  She received subsequent HD on 8/30 and 8/31.  It seems like the outpatient HD was arranged at [facility] schedule.  I will switch dialysis to tomorrow to get her into outpatient schedule. No urgent need for HD today.  Discussed with the dialysis nurse and with the patient.  # Anemia of CKD: Start Aranesp and monitor hemoglobin.  # CKD-MBD: Phosphorus level expect to improve with HD.  # HTN/volume: Currently on Imdur, hydralazine, UF with HD.  # Lower leg cellulitis, on Rocephin and doxycycline.  DVT evaluation negative.  Per primary team.  Subjective: Seen and examined at bedside.  Patient denies nausea, vomiting, chest pain, shortness of breath.  No new event. Objective Vital signs in last 24 hours: Vitals:   12/10/22 2002 12/10/22 2104 12/11/22 0648 12/11/22 0721  BP:  (!) 161/63 (!) 149/93   Pulse:  72 70   Resp:  19 16   Temp:  97.8 F (36.6 C) 98.8 F (37.1 C)   TempSrc:  Oral    SpO2: 93% 98% 97% 97%  Weight:      Height:       Weight change:   Intake/Output Summary (Last 24 hours) at 12/11/2022 1103 Last data filed at 12/11/2022 0904 Gross per 24 hour  Intake 300 ml  Output 380 ml  Net -80 ml       Labs: RENAL PANEL Recent Labs  Lab 12/04/22 1225 12/05/22 0427 12/06/22 0404 12/07/22 2000 12/08/22 0447 12/09/22 0410  12/10/22 0429 12/11/22 0420  NA 135 135 136 139 137 136 136 136  K 4.2 4.0 3.5 2.9* 2.6* 3.1* 3.7 3.7  CL 107 108 108 104 103 99 98 99  CO2 11* 12* 13* 21* 26 26 27 24   GLUCOSE 150* 106* 140* 139* 83 113* 138* 177*  BUN 105* 97* 98* 84* 59* 38* 30* 42*  CREATININE 10.03* 9.97* 9.81* 8.82* 6.38* 4.62* 3.62* 4.43*  CALCIUM 8.7* 8.7* 8.3* 7.7* 7.7* 7.8* 8.1* 8.5*  MG 3.2* 3.0* 2.8*  --   --   --   --   --   PHOS  --   --   --  6.7* 5.1* 4.0 3.9 5.4*  ALBUMIN  --   --   --  2.2* 2.3* 2.2* 2.1* 2.0*    Liver Function Tests: Recent Labs  Lab 12/09/22 0410 12/10/22 0429 12/11/22 0420  ALBUMIN 2.2* 2.1* 2.0*   No results for input(s): "LIPASE", "AMYLASE" in the last 168 hours. No results for input(s): "AMMONIA" in the last 168 hours. CBC: Recent Labs    06/26/22 0000 07/10/22 1546 07/10/22 1558 08/17/22 1551 09/08/22 1339 09/08/22 1405 10/16/22 1438 10/16/22 1442 12/05/22 0427 12/06/22 0404 12/07/22 2000 12/08/22 0447 12/09/22 0410  HGB 8.9* 7.9*   < >  --  9.8*   < > 9.9*   < >  8.6* 8.3* 8.1* 8.8* 9.0*  MCV  --  90.7   < >  --  90.8  --  91.2   < > 89.1 89.1 88.7 87.8 89.0  VITAMINB12  --   --   --  >2000*  --   --   --   --   --   --   --   --   --   FERRITIN 350 146  --   --  404*  --  314*  --   --   --   --   --   --   TIBC 190 227*  --   --  203*  --  221*  --   --   --   --   --   --   IRON 47 39  --   --  52  --  63  --   --   --   --   --   --    < > = values in this interval not displayed.    Cardiac Enzymes: No results for input(s): "CKTOTAL", "CKMB", "CKMBINDEX", "TROPONINI" in the last 168 hours. CBG: Recent Labs  Lab 12/10/22 1102 12/10/22 1649 12/10/22 2124 12/11/22 0206 12/11/22 0821  GLUCAP 119* 177* 162* 183* 178*    Iron Studies: No results for input(s): "IRON", "TIBC", "TRANSFERRIN", "FERRITIN" in the last 72 hours. Studies/Results: No results found.  Medications: Infusions:  cefTRIAXone (ROCEPHIN)  IV 1 g (12/10/22 1533)   doxycycline  (VIBRAMYCIN) IV 100 mg (12/11/22 1004)    Scheduled Medications:  amLODipine  5 mg Oral Daily   apixaban  2.5 mg Oral BID   atorvastatin  20 mg Oral Daily   budesonide (PULMICORT) nebulizer solution  0.25 mg Nebulization BID   Chlorhexidine Gluconate Cloth  6 each Topical Q0600   famotidine  20 mg Oral QHS   fluticasone  1 spray Each Nare Daily   hydrALAZINE  25 mg Oral TID   insulin aspart  0-6 Units Subcutaneous TID WC   insulin glargine-yfgn  5 Units Subcutaneous Daily   isosorbide mononitrate  60 mg Oral Daily   latanoprost  1 drop Both Eyes QHS   loratadine  10 mg Oral Daily   metoprolol succinate  12.5 mg Oral Daily    have reviewed scheduled and prn medications.  Physical Exam: General:NAD, comfortable Heart:RRR, s1s2 nl Lungs:clear b/l, no crackle Abdomen:soft, Non-tender, non-distended Extremities:No edema Dialysis Access: TDC +  Courtlyn Aki Jaynie Collins 12/11/2022,11:03 AM  LOS: 7 days

## 2022-12-11 NOTE — Evaluation (Signed)
Clinical/Bedside Swallow Evaluation Patient Details  Name: [name] [name] MRN: [id number] Date of Birth: 1940-04-17  Today's Date: 12/11/2022 Time: SLP Start Time (ACUTE ONLY): 1050 SLP Stop Time (ACUTE ONLY): 1117 SLP Time Calculation (min) (ACUTE ONLY): 27 min  Past Medical History:  Past Medical History:  Diagnosis Date   Acute kidney injury superimposed on chronic kidney disease (HCC) 12/02/2021   Acute respiratory failure with hypoxia (HCC) 09/03/2021   Anemia due to chronic kidney disease 09/03/2021   BPPV (benign paroxysmal positional vertigo), left 01/24/2021   Chronic diastolic CHF (congestive heart failure) (HCC) 09/02/2021   Combined forms of age-related cataract of both eyes 08/24/2016   CVA, old, hemiparesis (HCC) 02/06/2013   Diabetes mellitus (HCC)    Diabetic macular edema (HCC) 08/22/2012   Diabetic retinopathy associated with type 2 diabetes mellitus (HCC) 09/20/2020   Essential hypertension    Gait abnormality 12/26/2021   H/O: CVA (cerebrovascular accident) 04/10/2000   History of falling 12/26/2021   HTN (hypertension)    Hyperlipidemia    Hypermagnesemia 12/02/2021   Hypertensive heart and renal disease 09/20/2020   Hypertensive urgency 09/02/2021   Hypokalemia 12/02/2021   Left ventricular diastolic dysfunction, NYHA class 1 02/06/2013   NVG (neovascular glaucoma), left, severe stage 07/24/2019   Overweight (BMI 25.0-29.9) 10/09/2018   Poorly controlled type 2 diabetes mellitus with circulatory disorder (HCC)    Prolonged QT interval 12/02/2021   Pulmonary HTN (HCC)    mild by echo   Sensorineural hearing loss (SNHL), bilateral 01/24/2021   Stage 3b chronic kidney disease (HCC) 12/26/2021   Syncope and collapse 12/01/2021   Past Surgical History:  Past Surgical History:  Procedure Laterality Date   ABDOMINAL HYSTERECTOMY     CARDIAC CATHETERIZATION  03/17/2003   Normal, patent coronary arteries. Normal LV systolic function   CARDIOVASCULAR STRESS  TEST  08/28/2006   Small sized, moderate ischemiain the Basal Inferoseptal, Basal Inferior, Mid Inferior and Apical Inferior regions   TRANSTHORACIC ECHOCARDIOGRAM  02/17/2010   EF >55%, moderate-severely thickened septum, mild-moderate mitral annular calcification   HPI:  [name] [name] is a 82 y.o. female with medical history significant for HFpEF, type 2 diabetes insulin-dependent, CKD stage IV, hypertension, dyslipidemia, chronic anemia of CKD, and prior CVA who presented to the ED with worsening shortness of breath as well as dry cough that began last night.  Patient was admitted with acute hypoxemic respiratory failure-multifactorial in the setting of volume overload as well as suspected pneumonia.  She is also noted to have worsening AKI on CKD stage IV and nephrology recommending hemodialysis, family now appears agreeable.  She is noted to have pleural effusion and will undergo thoracentesis today.  Anticipate hemodialysis initiation in the next 1-2 days. BSE requested.    Assessment / Plan / Recommendation  Clinical Impression  Clinical swallow evaluation completed at bedside with RN present for portion. Pt denies difficuty swallowing. Oral motor examination reveals U/L dentures and otherwise WNL. Pt agreeable to only small amount of PO intake: thin via cup/straw, puree, and cracker. Pt with slow oral prep with dry cracker and benefited from liquid wash, otherwise WNL. Pt also took PO medications whole with water without incident. Recommend regular textures and thin liquids when Pt is alert and upright and will need feeder assist. OK for PO medications whole with water. No further SLP services indicated at this time. SLP Visit Diagnosis: Dysphagia, unspecified (R13.10)    Aspiration Risk  Mild aspiration risk    Diet Recommendation Regular;Thin liquid  Liquid Administration via: Cup;Straw Medication Administration: Whole meds with liquid Supervision: Staff to assist with self  feeding Compensations: Slow rate;Small sips/bites Postural Changes: Seated upright at 90 degrees;Remain upright for at least 30 minutes after po intake    Other  Recommendations Oral Care Recommendations: Oral care BID;Staff/trained caregiver to provide oral care    Recommendations for follow up therapy are one component of a multi-disciplinary discharge planning process, led by the attending physician.  Recommendations may be updated based on patient status, additional functional criteria and insurance authorization.  Follow up Recommendations No SLP follow up      Assistance Recommended at Discharge    Functional Status Assessment Patient has not had a recent decline in their functional status  Frequency and Duration            Prognosis Prognosis for improved oropharyngeal function: Good Barriers to Reach Goals: Cognitive deficits      Swallow Study   General Date of Onset: 12/04/22 HPI: [name] [name] is a 82 y.o. female with medical history significant for HFpEF, type 2 diabetes insulin-dependent, CKD stage IV, hypertension, dyslipidemia, chronic anemia of CKD, and prior CVA who presented to the ED with worsening shortness of breath as well as dry cough that began last night.  Patient was admitted with acute hypoxemic respiratory failure-multifactorial in the setting of volume overload as well as suspected pneumonia.  She is also noted to have worsening AKI on CKD stage IV and nephrology recommending hemodialysis, family now appears agreeable.  She is noted to have pleural effusion and will undergo thoracentesis today.  Anticipate hemodialysis initiation in the next 1-2 days. BSE requested. Type of Study: Bedside Swallow Evaluation Previous Swallow Assessment: N/A Diet Prior to this Study: Dysphagia 3 (mechanical soft);Thin liquids (Level 0) Temperature Spikes Noted: No Respiratory Status: Nasal cannula History of Recent Intubation: No Behavior/Cognition:  Alert;Cooperative;Pleasant mood Oral Cavity Assessment: Within Functional Limits Oral Care Completed by SLP: Yes Oral Cavity - Dentition: Dentures, top Vision: Impaired for self-feeding Self-Feeding Abilities: Needs assist Patient Positioning: Upright in bed Baseline Vocal Quality: Normal;Low vocal intensity Volitional Cough: Weak Volitional Swallow: Able to elicit    Oral/Motor/Sensory Function Overall Oral Motor/Sensory Function: Within functional limits   Ice Chips Ice chips: Within functional limits Presentation: Spoon   Thin Liquid Thin Liquid: Within functional limits Presentation: Cup;Straw    Nectar Thick Nectar Thick Liquid: Not tested   Honey Thick Honey Thick Liquid: Not tested   Puree Puree: Within functional limits Presentation: Spoon   Solid     Solid: Within functional limits Presentation: Self Fed     Thank you,  Havery Moros, CCC-SLP [phone]  Larue Lightner 12/11/2022,11:34 AM

## 2022-12-11 NOTE — Progress Notes (Signed)
Bipap order is PRN.  No distress noted at this time.

## 2022-12-11 NOTE — Progress Notes (Signed)
Assumed care of pt from off going RN. No changes in initial am assessment. Granddaughter at bedside. Cont with plan of care

## 2022-12-11 NOTE — Progress Notes (Signed)
PROGRESS NOTE    [name] [name]  FIE:[id number] DOB: 1940-06-03 DOA: 12/04/2022 PCP: Dorothyann Peng, MD   Brief Narrative:    [name] [name] is a 82 y.o. female with medical history significant for HFpEF, type 2 diabetes insulin-dependent, CKD stage IV, hypertension, dyslipidemia, chronic anemia of CKD, and prior CVA who presented to the ED with worsening shortness of breath as well as dry cough that began last night.  Patient was admitted with acute hypoxemic respiratory failure-multifactorial in the setting of volume overload as well as suspected pneumonia.  She is also noted to have worsening AKI on CKD stage IV and nephrology recommending hemodialysis, family now appears agreeable.  She is noted to have pleural effusion and will undergo thoracentesis today.  Anticipate hemodialysis initiation in the next 1-2 days.  Assessment & Plan:   Principal Problem:   Acute hypoxemic respiratory failure (HCC) Active Problems:   Chronic kidney disease (CKD), stage IV (severe) (HCC)   Essential hypertension   Mixed hyperlipidemia   Anemia due to chronic kidney disease   Diabetic retinopathy associated with type 2 diabetes mellitus (HCC)   Wound of left leg   End stage renal disease (HCC)  Assessment and Plan:  Acute hypoxemic respiratory failure-likely related to possible pneumonia -Off BiPAP; status post thoracentesis. -Left lower lobe atelectasis versus effusion with possible pneumonia. -Continue Rocephin and doxycycline for now and check procalcitonin -Scheduled breathing treatments, flutter valve and incentive spirometry has been encouraged. -Continue to wean off oxygen supplementation as needed. -Planning for 2 more days of antibiotics.   AKI on CKD stage IV to V with metabolic acidosis Poor oral intake over the last 1 week noted Family now appears agreeable for hemodialysis and this will be initiated per nephrology when appropriate -Status post dialysis catheter placement; and  back-to-back initiation treatments (8/29>>8/30>>8/31); social worker aware to assist with outpatient dialysis chair.   -Patient will have Sunday off with intention for fourth dialysis treatment later today initially; after discussing with nephrology and finding out that outpatient dialysis will be on Tuesday Thursday and Saturday will attend dialysis on 12/12/2022 to set outpatient schedule. -So far tolerated treatment without problems on 12/07/2022.  Will continue to follow nephrology service recommendations. -Continue to closely follow daily weights, volume status and response. -Continue to follow nephrology service recommendation; appreciate their assistance in care.   Left lower extremity cellulitis/wound -Continue to follow wound care as recommended by wound care service.   -Continue current antibiotics with Rocephin and doxycycline. -DVT evaluation negative.   Elevated BNP -Does not appear to be related to acute CHF -Likely elevated in the setting of AKI on CKD stage IV-V patient a transition point currently based on GFR. -Continue to follow daily weights and strict I's and O's. -At the moment we will continue volume management with dialysis treatments. -Continue low-sodium diet.   Anemia of CKD -Receives Retacrit injections outpatient with last dose 8/5 -No overt bleeding appreciated. -Continue to monitor with repeat CBC, no overt bleeding noted -Transfuse for hemoglobin less than 7 -Dose of Aranesp 60 mcg per nephrology -Further treatment with Epogen and IV iron as per nephrology discretion. -Continue to follow hemoglobin trend. -No overt bleeding reported or appreciated.   DM2 with mild hyperglycemia/hypoglycemia -Continue on carb modified diet -Continue to follow CBGs fluctuation and adjust hypoglycemic regimen as needed. -Now the patient has been started on dialysis mild hypoglycemic events appreciated; insulin pump has been discontinue and the sliding scale insulin along with  low-dose Semglee has been started.  Hypertension -Continue home antihypertensive agents -Continue as needed hydralazine -Follow vital signs -Anticipate improvement with dialysis -Continue to follow vital signs.   Dyslipidemia -Continue atorvastatin -Heart healthy diet discussed with patient.   PAF -Continue Eliquis for secondary prevention. -Continue metoprolol -Normal sinus rhythm per telemetry evaluation. -Continue to follow electrolytes.   HFpEF Continue metoprolol Repeat 2D echocardiogram with previous 03/2021 with LVEF 65-70% with grade 1 diastolic dysfunction and moderate LVH Noted to have elevated BNP, but more likely related to significant renal dysfunction. -At this moment after discussing with nephrologist and following further deterioration in her renal function plan is to initiate dialysis -Dialysis catheter has been placed and ready to use (appreciate assistance and care by Dr. Henreitta Leber). -Continue volume management with dialysis treatment.  Hypokalemia -Continue to have electrolytes repleted throughout hemodialysis -Potassium will continue to be stabilized with hemodialysis treatment; currently 3.7.  Presumed dysphagia -Speech therapy has been consulted and will follow recommendations.   DVT prophylaxis: Continue Eliquis. Code Status: Full Family Communication: Daughter at bedside 8/28 Disposition Plan:  Status is: Inpatient  Remains inpatient appropriate because: Need for IV antibiotics and inpatient procedures.   Consultants:  Nephrology Palliative care  Procedures:  Ultrasound thoracentesis pending 8/27  Antimicrobials:  Anti-infectives (From admission, onward)    Start     Dose/Rate Route Frequency Ordered Stop   12/07/22 1045  ceFAZolin (ANCEF) IVPB 2g/100 mL premix  Status:  Discontinued        2 g 200 mL/hr over 30 Minutes Intravenous On call to O.R. 12/07/22 7829 12/07/22 1333   12/05/22 2200  doxycycline (VIBRAMYCIN) 100 mg in sodium  chloride 0.9 % 250 mL IVPB        100 mg 125 mL/hr over 120 Minutes Intravenous Every 12 hours 12/04/22 1526     12/05/22 1500  cefTRIAXone (ROCEPHIN) 1 g in sodium chloride 0.9 % 100 mL IVPB        1 g 200 mL/hr over 30 Minutes Intravenous Every 24 hours 12/04/22 1526     12/04/22 1345  cefTRIAXone (ROCEPHIN) 1 g in sodium chloride 0.9 % 100 mL IVPB        1 g 200 mL/hr over 30 Minutes Intravenous  Once 12/04/22 1335 12/04/22 1537   12/04/22 1345  doxycycline (VIBRA-TABS) tablet 100 mg        100 mg Oral  Once 12/04/22 1335 12/04/22 1507      Subjective: No fever, no chest pain, no nausea, no vomiting.  At time of examination patient was having breakfast and very interactive.  No overnight events reported.  Objective: Vitals:   12/11/22 0648 12/11/22 0721 12/11/22 1113 12/11/22 1329  BP: (!) 149/93  (!) 171/67 (!) 164/74  Pulse: 70   92  Resp: 16     Temp: 98.8 F (37.1 C)   97.9 F (36.6 C)  TempSrc:    Oral  SpO2: 97% 97%  92%  Weight:      Height:        Intake/Output Summary (Last 24 hours) at 12/11/2022 1615 Last data filed at 12/11/2022 0904 Gross per 24 hour  Intake 240 ml  Output 380 ml  Net -140 ml   Filed Weights   12/04/22 1224 12/07/22 1033 12/07/22 1900  Weight: 64.4 kg 64.4 kg 64.5 kg    Examination: General exam: Alert, awake, following commands appropriately and very interactive today.  Reports no pain, no nausea, no vomiting and improved respiratory status. Respiratory system: Decreased breath sounds at the bases; no  wheezing, no using accessory muscles.  2 L nasal cannula in place. Cardiovascular system:RRR. No rubs or gallops; no JVD on exam. Gastrointestinal system: Abdomen is nondistended, soft and nontender. No organomegaly or masses felt. Normal bowel sounds heard. Central nervous system: No focal neurological deficits. Extremities: No cyanosis or clubbing; left lower extremity cellulitic process continue improving and open wound without active  drainage. Skin: No petechiae.  Right chest with tunneled dialysis catheter in place.  No signs of infection. Psychiatry: Judgement and insight appear normal. Mood & affect appropriate.   Data Reviewed: I have personally reviewed following labs and imaging studies  CBC: Recent Labs  Lab 12/05/22 0427 12/06/22 0404 12/07/22 2000 12/08/22 0447 12/09/22 0410  WBC 10.1 10.6* 11.4* 14.7* 18.0*  HGB 8.6* 8.3* 8.1* 8.8* 9.0*  HCT 26.1* 25.4* 24.4* 26.6* 27.4*  MCV 89.1 89.1 88.7 87.8 89.0  PLT 419* 417* 436* 421* 446*   Basic Metabolic Panel: Recent Labs  Lab 12/05/22 0427 12/06/22 0404 12/07/22 2000 12/08/22 0447 12/09/22 0410 12/10/22 0429 12/11/22 0420  NA 135 136 139 137 136 136 136  K 4.0 3.5 2.9* 2.6* 3.1* 3.7 3.7  CL 108 108 104 103 99 98 99  CO2 12* 13* 21* 26 26 27 24   GLUCOSE 106* 140* 139* 83 113* 138* 177*  BUN 97* 98* 84* 59* 38* 30* 42*  CREATININE 9.97* 9.81* 8.82* 6.38* 4.62* 3.62* 4.43*  CALCIUM 8.7* 8.3* 7.7* 7.7* 7.8* 8.1* 8.5*  MG 3.0* 2.8*  --   --   --   --   --   PHOS  --   --  6.7* 5.1* 4.0 3.9 5.4*   GFR: Estimated Creatinine Clearance: 8.3 mL/min (A) (by C-G formula based on SCr of 4.43 mg/dL (H)).  CBG: Recent Labs  Lab 12/10/22 1649 12/10/22 2124 12/11/22 0206 12/11/22 0821 12/11/22 1104  GLUCAP 177* 162* 183* 178* 207*   Sepsis Labs: Recent Labs  Lab 12/05/22 1418  PROCALCITON 0.35    Recent Results (from the past 240 hour(s))  Resp panel by RT-PCR (RSV, Flu A&B, Covid) Anterior Nasal Swab     Status: None   Collection Time: 12/04/22 12:26 PM   Specimen: Anterior Nasal Swab  Result Value Ref Range Status   SARS Coronavirus 2 by RT PCR NEGATIVE NEGATIVE Final    Comment: (NOTE) SARS-CoV-2 target nucleic acids are NOT DETECTED.  The SARS-CoV-2 RNA is generally detectable in upper respiratory specimens during the acute phase of infection. The lowest concentration of SARS-CoV-2 viral copies this assay can detect is 138  copies/mL. A negative result does not preclude SARS-Cov-2 infection and should not be used as the sole basis for treatment or other patient management decisions. A negative result may occur with  improper specimen collection/handling, submission of specimen other than nasopharyngeal swab, presence of viral mutation(s) within the areas targeted by this assay, and inadequate number of viral copies(<138 copies/mL). A negative result must be combined with clinical observations, patient history, and epidemiological information. The expected result is Negative.  Fact Sheet for Patients:  BloggerCourse.com  Fact Sheet for Healthcare Providers:  SeriousBroker.it  This test is no t yet approved or cleared by the [place] FDA and  has been authorized for detection and/or diagnosis of SARS-CoV-2 by FDA under an Emergency Use Authorization (EUA). This EUA will remain  in effect (meaning this test can be used) for the duration of the COVID-19 declaration under Section 564(b)(1) of the Act, 21 U.S.C.section 360bbb-3(b)(1), unless the authorization is  terminated  or revoked sooner.       Influenza A by PCR NEGATIVE NEGATIVE Final   Influenza B by PCR NEGATIVE NEGATIVE Final    Comment: (NOTE) The Xpert Xpress SARS-CoV-2/FLU/RSV plus assay is intended as an aid in the diagnosis of influenza from Nasopharyngeal swab specimens and should not be used as a sole basis for treatment. Nasal washings and aspirates are unacceptable for Xpert Xpress SARS-CoV-2/FLU/RSV testing.  Fact Sheet for Patients: BloggerCourse.com  Fact Sheet for Healthcare Providers: SeriousBroker.it  This test is not yet approved or cleared by the [place] FDA and has been authorized for detection and/or diagnosis of SARS-CoV-2 by FDA under an Emergency Use Authorization (EUA). This EUA will remain in effect (meaning  this test can be used) for the duration of the COVID-19 declaration under Section 564(b)(1) of the Act, 21 U.S.C. section 360bbb-3(b)(1), unless the authorization is terminated or revoked.     Resp Syncytial Virus by PCR NEGATIVE NEGATIVE Final    Comment: (NOTE) Fact Sheet for Patients: BloggerCourse.com  Fact Sheet for Healthcare Providers: SeriousBroker.it  This test is not yet approved or cleared by the [place] FDA and has been authorized for detection and/or diagnosis of SARS-CoV-2 by FDA under an Emergency Use Authorization (EUA). This EUA will remain in effect (meaning this test can be used) for the duration of the COVID-19 declaration under Section 564(b)(1) of the Act, 21 U.S.C. section 360bbb-3(b)(1), unless the authorization is terminated or revoked.  Performed at [facility], [address], [place], [place] [address]   Gram stain     Status: None   Collection Time: 12/06/22  9:40 AM   Specimen: Pleura  Result Value Ref Range Status   Specimen Description PLEURAL  Final   Special Requests NONE  Final   Gram Stain   Final    CYTOSPIN SMEAR NO ORGANISMS SEEN WBC PRESENT,BOTH PMN AND MONONUCLEAR Performed at [facility], [address], [place], [place] [address]    Report Status 12/06/2022 FINAL  Final  Culture, body fluid w Gram Stain-bottle     Status: None   Collection Time: 12/06/22  9:40 AM   Specimen: Pleura  Result Value Ref Range Status   Specimen Description PLEURAL  Final   Special Requests 10CC BOTTLES DRAWN AEROBIC AND ANAEROBIC  Final   Culture   Final    NO GROWTH 5 DAYS Performed at [facility], [address] [place], [place] [address]    Report Status 12/11/2022 FINAL  Final  Surgical PCR screen     Status: None   Collection Time: 12/07/22  3:07 AM   Specimen: Nasal Mucosa; Nasal Swab  Result Value Ref Range Status   MRSA, PCR NEGATIVE NEGATIVE Final   Staphylococcus aureus NEGATIVE  NEGATIVE Final    Comment: (NOTE) The Xpert SA Assay (FDA approved for NASAL specimens in patients 72 years of age and older), is one component of a comprehensive surveillance program. It is not intended to diagnose infection nor to guide or monitor treatment. Performed at [facility], [address] [place], [place] [address]     Radiology Studies: DG CHEST PORT 1 VIEW  Result Date: 12/11/2022 CLINICAL DATA:  Increased shortness of breath. EXAM: PORTABLE CHEST 1 VIEW COMPARISON:  Radiographs 12/07/2022 and 12/06/2022. FINDINGS: 0802 hours. Right IJ hemodialysis catheter projects to the level of the superior cavoatrial junction. The heart size and mediastinal contours are stable with aortic atherosclerosis. There are increased bibasilar pulmonary opacities with probable enlarging pleural  effusions and mild pulmonary edema. No pneumothorax. The bones appear unchanged. Telemetry leads overlie the chest. IMPRESSION: Increasing bibasilar pulmonary opacities, likely a combination of enlarging pleural effusions and pulmonary edema. Findings suggest congestive heart failure. Electronically Signed   By: Carey Bullocks M.D.   On: 12/11/2022 11:33    Scheduled Meds:  amLODipine  5 mg Oral Daily   apixaban  2.5 mg Oral BID   atorvastatin  20 mg Oral Daily   budesonide (PULMICORT) nebulizer solution  0.25 mg Nebulization BID   Chlorhexidine Gluconate Cloth  6 each Topical Q0600   [START ON 12/12/2022] darbepoetin (ARANESP) injection - DIALYSIS  60 mcg Subcutaneous Q Tue-1800   famotidine  20 mg Oral QHS   fluticasone  1 spray Each Nare Daily   hydrALAZINE  25 mg Oral TID   insulin aspart  0-6 Units Subcutaneous TID WC   insulin glargine-yfgn  5 Units Subcutaneous Daily   isosorbide mononitrate  60 mg Oral Daily   latanoprost  1 drop Both Eyes QHS   loratadine  10 mg Oral Daily   metoprolol succinate  12.5 mg Oral Daily   Continuous Infusions:  cefTRIAXone (ROCEPHIN)  IV 1 g (12/11/22 1443)    doxycycline (VIBRAMYCIN) IV 100 mg (12/11/22 1004)     LOS: 7 days   Time spent: 45 minutes  Fionnuala Hemmerich,MD [facility]  If 7PM-7AM, please contact night-coverage [email or web address] 12/11/2022, 4:15 PM

## 2022-12-11 NOTE — TOC Progression Note (Signed)
Transition of Care [facility]) - Progression Note    Patient Details  Name: [name] [name] MRN: [id number] Date of Birth: 16-May-1940  Transition [facility] CM/SW Contact  Villa Herb, [place] Phone Number: 12/11/2022, 12:24 PM  Clinical Narrative:    CSW spoke with pt and daughter about PT recommendation for SNF. At this time they are agreeable and prefer placement in [place]. CSW to complete referral and send out to [place] facilities. TOC to follow.   Expected Discharge Plan: Skilled Nursing Facility Barriers to Discharge: Continued Medical Work up  Expected Discharge Plan and Services In-house Referral: Clinical Social Work     Living arrangements for the past 2 months: Single Family Home                                       Social Determinants of Health (SDOH) Interventions SDOH Screenings   Food Insecurity: No Food Insecurity (12/04/2022)  Housing: Low Risk  (12/04/2022)  Transportation Needs: No Transportation Needs (12/04/2022)  Utilities: Not At Risk (12/04/2022)  Depression (PHQ2-9): Low Risk  (08/17/2022)  Financial Resource Strain: Low Risk  (10/19/2021)  Physical Activity: Inactive (10/19/2021)  Stress: No Stress Concern Present (10/19/2021)  Tobacco Use: Low Risk  (12/07/2022)    Readmission Risk Interventions    12/05/2022    3:37 PM  Readmission Risk Prevention Plan  Transportation Screening Complete  HRI or Home Care Consult Complete  Social Work Consult for Recovery Care Planning/Counseling Complete  Palliative Care Screening Complete  Medication Review Oceanographer) Complete

## 2022-12-11 NOTE — [place] FL2 (Addendum)
[place] MEDICAID FL2 LEVEL OF CARE FORM     IDENTIFICATION  Patient Name: [name] [name] Birthdate: June 11, 1940 Sex: female Admission Date (Current Location): 12/04/2022  [place] and [place] Number:  [employer] and Address:  [facility],  [address], [place] [address]      Provider Number: [id number]  Attending Physician Name and Address:  Vassie Loll, MD  Relative Name and Phone Number:       Current Level of Care: Hospital Recommended Level of Care: Skilled Nursing Facility Prior Approval Number:    Date Approved/Denied:   PASRR Number:  [id number]  Discharge Plan: SNF    Current Diagnoses: Patient Active Problem List   Diagnosis Date Noted   Encounter for wound care 12/09/2022   End stage renal disease (HCC) 12/07/2022   Acute hypoxemic respiratory failure (HCC) 12/04/2022   Wound of left leg 11/30/2022   Stage 3b chronic kidney disease (HCC) 12/26/2021   Gait abnormality 12/26/2021   Hypoglycemia 12/26/2021   History of falling 12/26/2021   Aspirin long-term use 12/26/2021   Hypermagnesemia 12/02/2021   Hypokalemia 12/02/2021   Acute kidney injury superimposed on chronic kidney disease (HCC) 12/02/2021   Prolonged QT interval 12/02/2021   Elevated troponin 12/02/2021   Syncope and collapse 12/01/2021   Anemia due to chronic kidney disease 09/03/2021   Acute respiratory failure with hypoxia (HCC) 09/03/2021   Chronic diastolic CHF (congestive heart failure) (HCC) 09/02/2021   Hypertensive urgency 09/02/2021   Chronic kidney disease (CKD), stage IV (severe) (HCC) 09/02/2021   Vertigo 02/27/2021   Wheezing 02/27/2021   BPPV (benign paroxysmal positional vertigo), left 01/24/2021   Dysfunction of right eustachian tube 01/24/2021   Sensorineural hearing loss (SNHL), bilateral 01/24/2021   Diabetic polyneuropathy associated with type 2 diabetes mellitus (HCC) 09/20/2020   Hypertensive heart and renal disease 09/20/2020    Right hand paresthesia 09/20/2020   Diabetic retinopathy associated with type 2 diabetes mellitus (HCC) 09/20/2020   Ptosis of eyelid, left 09/20/2020   Class 1 obesity due to excess calories with serious comorbidity and body mass index (BMI) of 30.0 to 30.9 in adult 09/20/2020   NVG (neovascular glaucoma), left, severe stage 07/24/2019   Mixed hyperlipidemia 10/09/2018   Overweight (BMI 25.0-29.9) 10/09/2018   Central retinal vein occlusion with macular edema of left eye 05/24/2017   Blindness, one eye, unspecified eye 08/24/2016   Combined forms of age-related cataract of both eyes 08/24/2016   Left ventricular diastolic dysfunction, NYHA class 1 02/06/2013   Essential hypertension    Poorly controlled type 2 diabetes mellitus with circulatory disorder (HCC)    Diabetic macular edema (HCC) 08/22/2012   Nuclear cataract of left eye, nonsenile 08/22/2012   Moderate nonproliferative diabetic retinopathy of right eye with macular edema associated with type 2 diabetes mellitus (HCC) 08/22/2012   Primary open angle glaucoma of both eyes, severe stage 08/07/2011   Nuclear sclerosis 08/07/2011    Orientation RESPIRATION BLADDER Height & Weight     Self, Place  O2 (4L) Incontinent, External catheter Weight: 142 lb 3.2 oz (64.5 kg) Height:  5\' 2"  (157.5 cm)  BEHAVIORAL SYMPTOMS/MOOD NEUROLOGICAL BOWEL NUTRITION STATUS      Continent Diet (Renal/carb modified)  AMBULATORY STATUS COMMUNICATION OF NEEDS Skin   Extensive Assist Verbally Normal                       Personal Care Assistance Level of Assistance  Bathing, Feeding, Dressing Bathing Assistance: Maximum  assistance Feeding assistance: Independent Dressing Assistance: Maximum assistance     Functional Limitations Info  Sight, Hearing, Speech Sight Info: Adequate Hearing Info: Adequate Speech Info: Adequate    SPECIAL CARE FACTORS FREQUENCY  PT (By licensed PT), OT (By licensed OT)     PT Frequency: 5 times weekly OT  Frequency: 5 times weekly            Contractures Contractures Info: Not present    Additional Factors Info  Code Status, Allergies Code Status Info: FULL Allergies Info: Hydralazine, Brimonidine, Dorzolamide, Latex, Timolol           Current Medications (12/11/2022):  This is the current hospital active medication list Current Facility-Administered Medications  Medication Dose Route Frequency Provider Last Rate Last Admin   acetaminophen (TYLENOL) tablet 650 mg  650 mg Oral Q6H PRN Maurilio Lovely D, DO   650 mg at 12/11/22 0207   Or   acetaminophen (TYLENOL) suppository 650 mg  650 mg Rectal Q6H PRN Sherryll Burger, Pratik D, DO       alteplase (CATHFLO ACTIVASE) injection 2 mg  2 mg Intracatheter Once PRN Terrial Rhodes, MD       alum & mag hydroxide-simeth (MAALOX/MYLANTA) 200-200-20 MG/5ML suspension 30 mL  30 mL Oral Q6H PRN Vassie Loll, MD   30 mL at 12/08/22 1551   amLODipine (NORVASC) tablet 5 mg  5 mg Oral Daily Sherryll Burger, Pratik D, DO   5 mg at 12/11/22 1109   apixaban (ELIQUIS) tablet 2.5 mg  2.5 mg Oral BID Vassie Loll, MD   2.5 mg at 12/11/22 6269   atorvastatin (LIPITOR) tablet 20 mg  20 mg Oral Daily Sherryll Burger, Pratik D, DO   20 mg at 12/11/22 0832   budesonide (PULMICORT) nebulizer solution 0.25 mg  0.25 mg Nebulization BID Sherryll Burger, Pratik D, DO   0.25 mg at 12/11/22 4854   cefTRIAXone (ROCEPHIN) 1 g in sodium chloride 0.9 % 100 mL IVPB  1 g Intravenous Q24H Sherryll Burger, Pratik D, DO 200 mL/hr at 12/10/22 1533 1 g at 12/10/22 1533   Chlorhexidine Gluconate Cloth 2 % PADS 6 each  6 each Topical Q0600 Terrial Rhodes, MD   6 each at 12/11/22 6270   Darbepoetin Alfa (ARANESP) injection 60 mcg  60 mcg Subcutaneous Q Mon-1800 Maxie Barb, MD       doxycycline (VIBRAMYCIN) 100 mg in sodium chloride 0.9 % 250 mL IVPB  100 mg Intravenous Q12H Sherryll Burger, Pratik D, DO 125 mL/hr at 12/11/22 1004 100 mg at 12/11/22 1004   famotidine (PEPCID) tablet 20 mg  20 mg Oral QHS Shah, Pratik D, DO   20  mg at 12/10/22 2058   feeding supplement (NEPRO CARB STEADY) liquid 237 mL  237 mL Oral PRN Terrial Rhodes, MD       fluticasone (FLONASE) 50 MCG/ACT nasal spray 1 spray  1 spray Each Nare Daily Shah, Pratik D, DO   1 spray at 12/11/22 3500   guaiFENesin-dextromethorphan (ROBITUSSIN DM) 100-10 MG/5ML syrup 5 mL  5 mL Oral Q4H PRN Sherryll Burger, Pratik D, DO   5 mL at 12/10/22 2057   heparin injection 1,000 Units  1,000 Units Intracatheter PRN Terrial Rhodes, MD   3,200 Units at 12/09/22 1755   heparin injection 1,300 Units  20 Units/kg Dialysis PRN Terrial Rhodes, MD   1,300 Units at 12/09/22 1445   hydrALAZINE (APRESOLINE) injection 10 mg  10 mg Intravenous Q4H PRN Maurilio Lovely D, DO   10 mg at 12/06/22 [phone]  hydrALAZINE (APRESOLINE) tablet 25 mg  25 mg Oral TID Sherryll Burger, Pratik D, DO   25 mg at 12/11/22 1113   hydrOXYzine (ATARAX) tablet 10 mg  10 mg Oral TID PRN Frankey Shown, DO   10 mg at 12/10/22 2337   insulin aspart (novoLOG) injection 0-6 Units  0-6 Units Subcutaneous TID WC Vassie Loll, MD   1 Units at 12/11/22 0834   insulin glargine-yfgn (SEMGLEE) injection 5 Units  5 Units Subcutaneous Daily Vassie Loll, MD   5 Units at 12/11/22 1000   ipratropium-albuterol (DUONEB) 0.5-2.5 (3) MG/3ML nebulizer solution 3 mL  3 mL Nebulization Q6H PRN Sherryll Burger, Pratik D, DO   3 mL at 12/10/22 0840   isosorbide mononitrate (IMDUR) 24 hr tablet 60 mg  60 mg Oral Daily Sherryll Burger, Pratik D, DO   60 mg at 12/11/22 1110   latanoprost (XALATAN) 0.005 % ophthalmic solution 1 drop  1 drop Both Eyes QHS Shah, Pratik D, DO   1 drop at 12/10/22 2148   loratadine (CLARITIN) tablet 10 mg  10 mg Oral Daily Sherryll Burger, Pratik D, DO   10 mg at 12/11/22 3875   metoprolol succinate (TOPROL-XL) 24 hr tablet 12.5 mg  12.5 mg Oral Daily Sherryll Burger, Pratik D, DO   12.5 mg at 12/11/22 1109   phenol (CHLORASEPTIC) mouth spray 1 spray  1 spray Mouth/Throat PRN Lucretia Roers, MD   1 spray at 12/08/22 2114   polyethylene glycol (MIRALAX  / GLYCOLAX) packet 17 g  17 g Oral Daily PRN Vassie Loll, MD   17 g at 12/09/22 1206   prochlorperazine (COMPAZINE) injection 10 mg  10 mg Intravenous Q6H PRN Vassie Loll, MD   10 mg at 12/09/22 6433     Discharge Medications: Please see discharge summary for a list of discharge medications.  Relevant Imaging Results:  Relevant Lab Results:   Additional Information SSN: [id number] [place]

## 2022-12-12 ENCOUNTER — Encounter (HOSPITAL_COMMUNITY): Payer: Self-pay | Admitting: General Surgery

## 2022-12-12 ENCOUNTER — Ambulatory Visit: Payer: Medicare Other

## 2022-12-12 DIAGNOSIS — J9601 Acute respiratory failure with hypoxia: Secondary | ICD-10-CM | POA: Diagnosis not present

## 2022-12-12 DIAGNOSIS — S81802D Unspecified open wound, left lower leg, subsequent encounter: Secondary | ICD-10-CM | POA: Diagnosis not present

## 2022-12-12 DIAGNOSIS — J189 Pneumonia, unspecified organism: Secondary | ICD-10-CM | POA: Diagnosis not present

## 2022-12-12 DIAGNOSIS — E782 Mixed hyperlipidemia: Secondary | ICD-10-CM | POA: Diagnosis not present

## 2022-12-12 LAB — CBC
HCT: 24.5 % — ABNORMAL LOW (ref 36.0–46.0)
Hemoglobin: 8 g/dL — ABNORMAL LOW (ref 12.0–15.0)
MCH: 28.7 pg (ref 26.0–34.0)
MCHC: 32.7 g/dL (ref 30.0–36.0)
MCV: 87.8 fL (ref 80.0–100.0)
Platelets: 474 10*3/uL — ABNORMAL HIGH (ref 150–400)
RBC: 2.79 MIL/uL — ABNORMAL LOW (ref 3.87–5.11)
RDW: 15.7 % — ABNORMAL HIGH (ref 11.5–15.5)
WBC: 13.8 10*3/uL — ABNORMAL HIGH (ref 4.0–10.5)
nRBC: 0 % (ref 0.0–0.2)

## 2022-12-12 LAB — RENAL FUNCTION PANEL
Albumin: 2.4 g/dL — ABNORMAL LOW (ref 3.5–5.0)
Anion gap: 17 — ABNORMAL HIGH (ref 5–15)
BUN: 52 mg/dL — ABNORMAL HIGH (ref 8–23)
CO2: 21 mmol/L — ABNORMAL LOW (ref 22–32)
Calcium: 8.5 mg/dL — ABNORMAL LOW (ref 8.9–10.3)
Chloride: 97 mmol/L — ABNORMAL LOW (ref 98–111)
Creatinine, Ser: 5.05 mg/dL — ABNORMAL HIGH (ref 0.44–1.00)
GFR, Estimated: 8 mL/min — ABNORMAL LOW (ref >60)
Glucose, Bld: 208 mg/dL — ABNORMAL HIGH (ref 70–99)
Phosphorus: 4.9 mg/dL — ABNORMAL HIGH (ref 2.5–4.6)
Potassium: 4.1 mmol/L (ref 3.5–5.1)
Sodium: 135 mmol/L (ref 135–145)

## 2022-12-12 LAB — GLUCOSE, CAPILLARY
Glucose-Capillary: 125 mg/dL — ABNORMAL HIGH (ref 70–99)
Glucose-Capillary: 129 mg/dL — ABNORMAL HIGH (ref 70–99)
Glucose-Capillary: 165 mg/dL — ABNORMAL HIGH (ref 70–99)
Glucose-Capillary: 173 mg/dL — ABNORMAL HIGH (ref 70–99)
Glucose-Capillary: 206 mg/dL — ABNORMAL HIGH (ref 70–99)
Glucose-Capillary: 214 mg/dL — ABNORMAL HIGH (ref 70–99)
Glucose-Capillary: 242 mg/dL — ABNORMAL HIGH (ref 70–99)
Glucose-Capillary: 93 mg/dL (ref 70–99)

## 2022-12-12 MED ORDER — LORAZEPAM 0.5 MG PO TABS
0.5000 mg | ORAL_TABLET | ORAL | Status: DC | PRN
  Administered 2022-12-12 – 2022-12-18 (×2): 0.5 mg via ORAL
  Filled 2022-12-12 (×2): qty 1

## 2022-12-12 MED ORDER — TRAZODONE HCL 50 MG PO TABS
25.0000 mg | ORAL_TABLET | Freq: Every evening | ORAL | Status: DC | PRN
  Administered 2022-12-12 – 2022-12-16 (×3): 25 mg via ORAL
  Filled 2022-12-12 (×3): qty 1

## 2022-12-12 MED ORDER — SODIUM CHLORIDE 0.9 % IV SOLN
1.0000 g | INTRAVENOUS | Status: DC
  Administered 2022-12-12: 1 g via INTRAVENOUS
  Filled 2022-12-12: qty 10

## 2022-12-12 MED ORDER — INFLUENZA VAC A&B SURF ANT ADJ 0.5 ML IM SUSY: 0.5000 mL | PREFILLED_SYRINGE | INTRAMUSCULAR | Status: DC

## 2022-12-12 MED ORDER — DOXYCYCLINE HYCLATE 100 MG PO TABS
100.0000 mg | ORAL_TABLET | Freq: Two times a day (BID) | ORAL | Status: AC
  Administered 2022-12-12 – 2022-12-13 (×3): 100 mg via ORAL
  Filled 2022-12-12 (×3): qty 1

## 2022-12-12 MED ORDER — HEPARIN SODIUM (PORCINE) 1000 UNIT/ML IJ SOLN
INTRAMUSCULAR | Status: AC
  Filled 2022-12-12: qty 8

## 2022-12-12 NOTE — Progress Notes (Addendum)
Physical Therapy Treatment Patient Details Name: NIVRITI LORES MRN: [id number] DOB: 1940/12/30 Today's Date: 12/12/2022   History of Present Illness per MD note today:Darriona W Bucknam is a 82 y.o. female with medical history significant for HFpEF, type 2 diabetes insulin-dependent, CKD stage IV, hypertension, dyslipidemia, chronic anemia of CKD, and prior CVA who presented to the ED with worsening shortness of breath as well as dry cough that began last night.  Patient was admitted with acute hypoxemic respiratory failure-multifactorial in the setting of volume overload as well as suspected pneumonia.  She is also noted to have worsening AKI on CKD stage IV and nephrology recommending hemodialysis, family now appears agreeable.  She is noted to have pleural effusion and will undergo thoracentesis today.  Anticipate hemodialysis initiation in the next 1-2 days.    PT Comments  Patient agreeable for therapy.  Patient demonstrates slow labored movement for sitting up at bedside with most difficulty scooting to EOB, fair return for completing BLE ROM/strengthening exercises requiring verbal/tactile cueing and able to take a few steps at bedside before having to sit due to fatigue and generalized weakness.  Patient tolerated sitting up in chair with family members present after therapy.  Patient will benefit from continued skilled physical therapy in hospital and recommended venue below to increase strength, balance, endurance for safe ADLs and gait.  Patient's SpO2 dropped to 80% with exertion while on room air and put back on 4 LPM with SpO2 at 91%.       If plan is discharge home, recommend the following: A lot of help with bathing/dressing/bathroom;A lot of help with walking and/or transfers;Help with stairs or ramp for entrance;Assistance with cooking/housework   Can travel by private vehicle     No  Equipment Recommendations  None recommended by PT    Recommendations for Other Services        Precautions / Restrictions Precautions Precautions: Fall Restrictions Weight Bearing Restrictions: No     Mobility  Bed Mobility Overal bed mobility: Needs Assistance Bed Mobility: Supine to Sit     Supine to sit: Mod assist     General bed mobility comments: increased time, labored movement    Transfers Overall transfer level: Needs assistance Equipment used: Rolling walker (2 wheels) Transfers: Sit to/from Stand, Bed to chair/wheelchair/BSC Sit to Stand: Min assist, Mod assist   Step pivot transfers: Mod assist       General transfer comment: unsteady labored movement    Ambulation/Gait Ambulation/Gait assistance: Mod assist Gait Distance (Feet): 10 Feet Assistive device: Rolling walker (2 wheels) Gait Pattern/deviations: Decreased step length - right, Decreased step length - left, Decreased stride length, Trunk flexed Gait velocity: slow     General Gait Details: limited to a few side steps, steps forward/backwars with slow labored movement, limited mostly due to fatigue and BLE weakness   Stairs             Wheelchair Mobility     Tilt Bed    Modified Rankin (Stroke Patients Only)       Balance Overall balance assessment: Needs assistance Sitting-balance support: Feet supported, No upper extremity supported Sitting balance-Leahy Scale: Fair Sitting balance - Comments: fair/good seated at EOB   Standing balance support: During functional activity, Reliant on assistive device for balance, Bilateral upper extremity supported Standing balance-Leahy Scale: Poor Standing balance comment: fair/poor using RW  Cognition Arousal: Alert Behavior During Therapy: WFL for tasks assessed/performed Overall Cognitive Status: Within Functional Limits for tasks assessed                                          Exercises General Exercises - Lower Extremity Long Arc Quad: Seated, AROM,  Strengthening, Both, 10 reps Hip Flexion/Marching: Seated, AROM, Strengthening, Both, 10 reps Toe Raises: Seated, AROM, Strengthening, Both, 10 reps Heel Raises: Seated, AROM, Strengthening, Both, 10 reps    General Comments        Pertinent Vitals/Pain Pain Assessment Pain Assessment: No/denies pain    Home Living                          Prior Function            PT Goals (current goals can now be found in the care plan section) Acute Rehab PT Goals Patient Stated Goal: to be able to walk again PT Goal Formulation: With patient/family Potential to Achieve Goals: Good Progress towards PT goals: Progressing toward goals    Frequency    Min 3X/week      PT Plan      Co-evaluation              AM-PAC PT "6 Clicks" Mobility   Outcome Measure  Help needed turning from your back to your side while in a flat bed without using bedrails?: A Little Help needed moving from lying on your back to sitting on the side of a flat bed without using bedrails?: A Lot Help needed moving to and from a bed to a chair (including a wheelchair)?: A Lot Help needed standing up from a chair using your arms (e.g., wheelchair or bedside chair)?: A Lot Help needed to walk in hospital room?: A Lot Help needed climbing 3-5 steps with a railing? : A Lot 6 Click Score: 13    End of Session Equipment Utilized During Treatment: Oxygen Activity Tolerance: Patient tolerated treatment well;Patient limited by fatigue Patient left: in chair;with call bell/phone within reach;with family/visitor present Nurse Communication: Mobility status PT Visit Diagnosis: Unsteadiness on feet (R26.81);Other abnormalities of gait and mobility (R26.89);Muscle weakness (generalized) (M62.81)     Time: 1610-9604 PT Time Calculation (min) (ACUTE ONLY): 30 min  Charges:    $Therapeutic Exercise: 8-22 mins $Therapeutic Activity: 8-22 mins PT General Charges $$ ACUTE PT VISIT: 1 Visit                      1:57 PM, 12/12/22 Ocie Bob, MPT Physical Therapist with [facility] 336 [phone] office [phone] mobile phone

## 2022-12-12 NOTE — Plan of Care (Signed)
  Problem: Education: Goal: Knowledge of General Education information will improve Description: Including pain rating scale, medication(s)/side effects and non-pharmacologic comfort measures Outcome: Progressing   Problem: Health Behavior/Discharge Planning: Goal: Ability to manage health-related needs will improve Outcome: Progressing   Problem: Clinical Measurements: Goal: Ability to maintain clinical measurements within normal limits will improve Outcome: Progressing Goal: Will remain free from infection Outcome: Progressing Goal: Diagnostic test results will improve Outcome: Progressing Goal: Respiratory complications will improve Outcome: Progressing Goal: Cardiovascular complication will be avoided Outcome: Progressing   Problem: Activity: Goal: Risk for activity intolerance will decrease Outcome: Progressing   Problem: Elimination: Goal: Will not experience complications related to bowel motility Outcome: Progressing Goal: Will not experience complications related to urinary retention Outcome: Progressing   Problem: Pain Managment: Goal: General experience of comfort will improve Outcome: Progressing   Problem: Safety: Goal: Ability to remain free from injury will improve Outcome: Progressing   Problem: Skin Integrity: Goal: Risk for impaired skin integrity will decrease Outcome: Progressing   Problem: Education: Goal: Ability to describe self-care measures that may prevent or decrease complications (Diabetes Survival Skills Education) will improve Outcome: Progressing Goal: Individualized Educational Video(s) Outcome: Progressing   Problem: Coping: Goal: Ability to adjust to condition or change in health will improve Outcome: Progressing   Problem: Fluid Volume: Goal: Ability to maintain a balanced intake and output will improve Outcome: Progressing   Problem: Health Behavior/Discharge Planning: Goal: Ability to identify and utilize available  resources and services will improve Outcome: Progressing Goal: Ability to manage health-related needs will improve Outcome: Progressing   Problem: Metabolic: Goal: Ability to maintain appropriate glucose levels will improve Outcome: Progressing   Problem: Nutritional: Goal: Maintenance of adequate nutrition will improve Outcome: Progressing Goal: Progress toward achieving an optimal weight will improve Outcome: Progressing   Problem: Skin Integrity: Goal: Risk for impaired skin integrity will decrease Outcome: Progressing   Problem: Tissue Perfusion: Goal: Adequacy of tissue perfusion will improve Outcome: Progressing   Problem: Nutrition: Goal: Adequate nutrition will be maintained Outcome: Not Progressing   Problem: Coping: Goal: Level of anxiety will decrease Outcome: Not Progressing

## 2022-12-12 NOTE — Plan of Care (Signed)

## 2022-12-12 NOTE — Progress Notes (Signed)
PROGRESS NOTE    [name] [name]  XBJ:[id number] DOB: October 02, 1940 DOA: 12/04/2022 PCP: Dorothyann Peng, MD   Brief Narrative:    [name] [name] is a 82 y.o. female with medical history significant for HFpEF, type 2 diabetes insulin-dependent, CKD stage IV, hypertension, dyslipidemia, chronic anemia of CKD, and prior CVA who presented to the ED with worsening shortness of breath as well as dry cough that began last night.  Patient was admitted with acute hypoxemic respiratory failure-multifactorial in the setting of volume overload as well as suspected pneumonia.  She is also noted to have worsening AKI on CKD stage IV and nephrology recommending hemodialysis, family now appears agreeable.  She is noted to have pleural effusion and will undergo thoracentesis today.  Anticipate discharge to skilled nursing facility pending bed availability in the next 24-48 hours.  Assessment & Plan:   Principal Problem:   Acute hypoxemic respiratory failure (HCC) Active Problems:   Chronic kidney disease (CKD), stage IV (severe) (HCC)   Essential hypertension   Mixed hyperlipidemia   Anemia due to chronic kidney disease   Diabetic retinopathy associated with type 2 diabetes mellitus (HCC)   Wound of left leg   End stage renal disease (HCC)  Assessment and Plan:  Acute hypoxemic respiratory failure-likely related to possible pneumonia -Off BiPAP; status post thoracentesis. -Left lower lobe atelectasis versus effusion with possible pneumonia. -Continue Rocephin and doxycycline for now and check procalcitonin -Scheduled breathing treatments, flutter valve and incentive spirometry has been encouraged. -Continue to wean off oxygen supplementation as needed. -Planning for 1 more day of antibiotics to finalize treatment.   AKI on CKD stage IV to V with metabolic acidosis Poor oral intake over the last 1 week noted Family now appears agreeable for hemodialysis and this will be initiated per nephrology  when appropriate -Status post dialysis catheter placement; and back-to-back initiation treatments (8/29>>8/30>>8/31); social worker aware to assist with outpatient dialysis chair.   -Patient so far has tolerated dialysis treatment; following available spot in the outpatient setting (At [facility] dialysis Tuesday-Thursday and Saturday) patient will have dialysis today.  Continue to follow nephrology service recommendation. -Continue to closely follow daily weights, volume status and response. -Continue to follow nephrology service recommendation; appreciate their assistance in care.   Left lower extremity cellulitis/wound -Continue to follow wound care as recommended by wound care service.   -Continue current antibiotics with Rocephin and doxycycline. -DVT evaluation negative.   Elevated BNP -Does not appear to be related to acute CHF -Likely elevated in the setting of AKI on CKD stage IV-V patient a transition point currently based on GFR. -Continue to follow daily weights and strict I's and O's. -At the moment we will continue volume management with dialysis treatments. -Continue low-sodium diet.   Anemia of CKD -Receives Retacrit injections outpatient with last dose 8/5 -No overt bleeding appreciated. -Continue to monitor with repeat CBC, no overt bleeding noted -Transfuse for hemoglobin less than 7 -Dose of Aranesp 60 mcg per nephrology -Further treatment with Epogen and IV iron as per nephrology discretion. -Continue to follow hemoglobin trend. -No overt bleeding reported or appreciated.   DM2 with mild hyperglycemia/hypoglycemia -Continue on carb modified diet -Continue to follow CBGs fluctuation and adjust hypoglycemic regimen as needed. -Now the patient has been started on dialysis mild hypoglycemic events appreciated; insulin pump has been discontinue and the sliding scale insulin along with low-dose Semglee has been started.   Hypertension -Continue home antihypertensive  agents -Continue as needed hydralazine -Follow vital signs -Anticipate  improvement with dialysis -Continue to follow vital signs.   Dyslipidemia -Continue atorvastatin -Heart healthy diet discussed with patient.   PAF -Continue Eliquis for secondary prevention. -Continue metoprolol -Normal sinus rhythm per telemetry evaluation. -Continue to follow electrolytes.   HFpEF Continue metoprolol Repeat 2D echocardiogram with previous 03/2021 with LVEF 65-70% with grade 1 diastolic dysfunction and moderate LVH Noted to have elevated BNP, but more likely related to significant renal dysfunction. -At this moment after discussing with nephrologist and following further deterioration in her renal function plan is to initiate dialysis -Dialysis catheter has been placed and ready to use (appreciate assistance and care by Dr. Henreitta Leber). -Continue volume management with dialysis treatment.  Hypokalemia -Continue to have electrolytes repleted throughout hemodialysis -Potassium will continue to be stabilized with hemodialysis treatment. -No abnormalities appreciated on telemetry.  Presumed dysphagia -Speech therapy has been consulted and will follow recommendations.   DVT prophylaxis: Continue Eliquis. Code Status: Full Family Communication: Son at bedside 8/31; no family members at bedside on today's examination. Disposition Plan:  Status is: Inpatient  Remains inpatient appropriate because: Need for IV antibiotics and inpatient procedures.   Consultants:  Nephrology Palliative care  Procedures:  Ultrasound thoracentesis pending 8/27  Antimicrobials:  Anti-infectives (From admission, onward)    Start     Dose/Rate Route Frequency Ordered Stop   12/12/22 2200  doxycycline (VIBRA-TABS) tablet 100 mg        100 mg Oral Every 12 hours 12/12/22 0930 12/14/22 0959   12/12/22 1500  cefTRIAXone (ROCEPHIN) 1 g in sodium chloride 0.9 % 100 mL IVPB        1 g 200 mL/hr over 30 Minutes  Intravenous Every 24 hours 12/12/22 0930 12/14/22 1459   12/07/22 1045  ceFAZolin (ANCEF) IVPB 2g/100 mL premix  Status:  Discontinued        2 g 200 mL/hr over 30 Minutes Intravenous On call to O.R. 12/07/22 9528 12/07/22 1333   12/05/22 2200  doxycycline (VIBRAMYCIN) 100 mg in sodium chloride 0.9 % 250 mL IVPB  Status:  Discontinued        100 mg 125 mL/hr over 120 Minutes Intravenous Every 12 hours 12/04/22 1526 12/12/22 0930   12/05/22 1500  cefTRIAXone (ROCEPHIN) 1 g in sodium chloride 0.9 % 100 mL IVPB  Status:  Discontinued        1 g 200 mL/hr over 30 Minutes Intravenous Every 24 hours 12/04/22 1526 12/12/22 0930   12/04/22 1345  cefTRIAXone (ROCEPHIN) 1 g in sodium chloride 0.9 % 100 mL IVPB        1 g 200 mL/hr over 30 Minutes Intravenous  Once 12/04/22 1335 12/04/22 1537   12/04/22 1345  doxycycline (VIBRA-TABS) tablet 100 mg        100 mg Oral  Once 12/04/22 1335 12/04/22 1507      Subjective: No fever, no chest pain, no vomiting.  Still intermittently nauseated.  More alert/awake and per nursing staff increasing oral intake.  Chronically ill, weak and deconditioned.  Objective: Vitals:   12/12/22 1430 12/12/22 1500 12/12/22 1510 12/12/22 1515  BP: (!) 160/74 (!) 161/63  (!) 159/70  Pulse:    88  Resp: (!) 26 (!) 28  17  Temp:    97.9 F (36.6 C)  TempSrc:    Oral  SpO2: 99% 99% 98% 98%  Weight:      Height:        Intake/Output Summary (Last 24 hours) at 12/12/2022 1637 Last data filed at 12/12/2022 1515  Gross per 24 hour  Intake 840 ml  Output 2000 ml  Net -1160 ml   Filed Weights   12/07/22 1900 12/12/22 0500 12/12/22 1120  Weight: 64.5 kg 71.8 kg 71.8 kg    Examination: General exam: More alert and interactive; afebrile, no chest pain, no vomiting.  Eating more per nursing staff; still intermittently nauseated.  Patient in no acute distress. Respiratory system: Improved from, bilaterally; no using accessory muscle.  1-2 L nasal cannula in  place. Cardiovascular system: Rate controlled; no rubs, no gallops, no JVD. Gastrointestinal system: Abdomen is nondistended, soft and nontender. No organomegaly or masses felt. Normal bowel sounds heard. Central nervous system: Moving 4 limbs spontaneously; no focal neurological deficits. Extremities: No cyanosis or clubbing.  Left lower extremity cellulitic processes/wound adequately healing, no active drainage and no erythematous changes. Skin: No petechiae. Psychiatry: Mood & affect appropriate.   Data Reviewed: I have personally reviewed following labs and imaging studies  CBC: Recent Labs  Lab 12/06/22 0404 12/07/22 2000 12/08/22 0447 12/09/22 0410 12/12/22 1405  WBC 10.6* 11.4* 14.7* 18.0* 13.8*  HGB 8.3* 8.1* 8.8* 9.0* 8.0*  HCT 25.4* 24.4* 26.6* 27.4* 24.5*  MCV 89.1 88.7 87.8 89.0 87.8  PLT 417* 436* 421* 446* 474*   Basic Metabolic Panel: Recent Labs  Lab 12/06/22 0404 12/07/22 2000 12/08/22 0447 12/09/22 0410 12/10/22 0429 12/11/22 0420 12/12/22 0609  NA 136   < > 137 136 136 136 135  K 3.5   < > 2.6* 3.1* 3.7 3.7 4.1  CL 108   < > 103 99 98 99 97*  CO2 13*   < > 26 26 27 24  21*  GLUCOSE 140*   < > 83 113* 138* 177* 208*  BUN 98*   < > 59* 38* 30* 42* 52*  CREATININE 9.81*   < > 6.38* 4.62* 3.62* 4.43* 5.05*  CALCIUM 8.3*   < > 7.7* 7.8* 8.1* 8.5* 8.5*  MG 2.8*  --   --   --   --   --   --   PHOS  --    < > 5.1* 4.0 3.9 5.4* 4.9*   < > = values in this interval not displayed.   GFR: Estimated Creatinine Clearance: 7.7 mL/min (A) (by C-G formula based on SCr of 5.05 mg/dL (H)).  CBG: Recent Labs  Lab 12/12/22 0019 12/12/22 0416 12/12/22 0725 12/12/22 1108 12/12/22 1604  GLUCAP 165* 173* 206* 242* 125*   Sepsis Labs: No results for input(s): "PROCALCITON", "LATICACIDVEN" in the last 168 hours.   Recent Results (from the past 240 hour(s))  Resp panel by RT-PCR (RSV, Flu A&B, Covid) Anterior Nasal Swab     Status: None   Collection Time:  12/04/22 12:26 PM   Specimen: Anterior Nasal Swab  Result Value Ref Range Status   SARS Coronavirus 2 by RT PCR NEGATIVE NEGATIVE Final    Comment: (NOTE) SARS-CoV-2 target nucleic acids are NOT DETECTED.  The SARS-CoV-2 RNA is generally detectable in upper respiratory specimens during the acute phase of infection. The lowest concentration of SARS-CoV-2 viral copies this assay can detect is 138 copies/mL. A negative result does not preclude SARS-Cov-2 infection and should not be used as the sole basis for treatment or other patient management decisions. A negative result may occur with  improper specimen collection/handling, submission of specimen other than nasopharyngeal swab, presence of viral mutation(s) within the areas targeted by this assay, and inadequate number of viral copies(<138 copies/mL). A negative result must  be combined with clinical observations, patient history, and epidemiological information. The expected result is Negative.  Fact Sheet for Patients:  BloggerCourse.com  Fact Sheet for Healthcare Providers:  SeriousBroker.it  This test is no t yet approved or cleared by the [place] FDA and  has been authorized for detection and/or diagnosis of SARS-CoV-2 by FDA under an Emergency Use Authorization (EUA). This EUA will remain  in effect (meaning this test can be used) for the duration of the COVID-19 declaration under Section 564(b)(1) of the Act, 21 U.S.C.section 360bbb-3(b)(1), unless the authorization is terminated  or revoked sooner.       Influenza A by PCR NEGATIVE NEGATIVE Final   Influenza B by PCR NEGATIVE NEGATIVE Final    Comment: (NOTE) The Xpert Xpress SARS-CoV-2/FLU/RSV plus assay is intended as an aid in the diagnosis of influenza from Nasopharyngeal swab specimens and should not be used as a sole basis for treatment. Nasal washings and aspirates are unacceptable for Xpert Xpress  SARS-CoV-2/FLU/RSV testing.  Fact Sheet for Patients: BloggerCourse.com  Fact Sheet for Healthcare Providers: SeriousBroker.it  This test is not yet approved or cleared by the [place] FDA and has been authorized for detection and/or diagnosis of SARS-CoV-2 by FDA under an Emergency Use Authorization (EUA). This EUA will remain in effect (meaning this test can be used) for the duration of the COVID-19 declaration under Section 564(b)(1) of the Act, 21 U.S.C. section 360bbb-3(b)(1), unless the authorization is terminated or revoked.     Resp Syncytial Virus by PCR NEGATIVE NEGATIVE Final    Comment: (NOTE) Fact Sheet for Patients: BloggerCourse.com  Fact Sheet for Healthcare Providers: SeriousBroker.it  This test is not yet approved or cleared by the [place] FDA and has been authorized for detection and/or diagnosis of SARS-CoV-2 by FDA under an Emergency Use Authorization (EUA). This EUA will remain in effect (meaning this test can be used) for the duration of the COVID-19 declaration under Section 564(b)(1) of the Act, 21 U.S.C. section 360bbb-3(b)(1), unless the authorization is terminated or revoked.  Performed at [facility], [address] [place], [place] [address]   Gram stain     Status: None   Collection Time: 12/06/22  9:40 AM   Specimen: Pleura  Result Value Ref Range Status   Specimen Description PLEURAL  Final   Special Requests NONE  Final   Gram Stain   Final    CYTOSPIN SMEAR NO ORGANISMS SEEN WBC PRESENT,BOTH PMN AND MONONUCLEAR Performed at [facility], [address] [place], [place] [address]    Report Status 12/06/2022 FINAL  Final  Culture, body fluid w Gram Stain-bottle     Status: None   Collection Time: 12/06/22  9:40 AM   Specimen: Pleura  Result Value Ref Range Status   Specimen Description PLEURAL  Final   Special Requests  10CC BOTTLES DRAWN AEROBIC AND ANAEROBIC  Final   Culture   Final    NO GROWTH 5 DAYS Performed at [facility], [address], [place], [place] [address]    Report Status 12/11/2022 FINAL  Final  Surgical PCR screen     Status: None   Collection Time: 12/07/22  3:07 AM   Specimen: Nasal Mucosa; Nasal Swab  Result Value Ref Range Status   MRSA, PCR NEGATIVE NEGATIVE Final   Staphylococcus aureus NEGATIVE NEGATIVE Final    Comment: (NOTE) The Xpert SA Assay (FDA approved for NASAL specimens in patients 27 years of age and older), is one component of a  comprehensive surveillance program. It is not intended to diagnose infection nor to guide or monitor treatment. Performed at [facility], [address], [place], [place] [address]     Radiology Studies: DG CHEST PORT 1 VIEW  Result Date: 12/11/2022 CLINICAL DATA:  Increased shortness of breath. EXAM: PORTABLE CHEST 1 VIEW COMPARISON:  Radiographs 12/07/2022 and 12/06/2022. FINDINGS: 0802 hours. Right IJ hemodialysis catheter projects to the level of the superior cavoatrial junction. The heart size and mediastinal contours are stable with aortic atherosclerosis. There are increased bibasilar pulmonary opacities with probable enlarging pleural effusions and mild pulmonary edema. No pneumothorax. The bones appear unchanged. Telemetry leads overlie the chest. IMPRESSION: Increasing bibasilar pulmonary opacities, likely a combination of enlarging pleural effusions and pulmonary edema. Findings suggest congestive heart failure. Electronically Signed   By: Carey Bullocks M.D.   On: 12/11/2022 11:33    Scheduled Meds:  amLODipine  5 mg Oral Daily   apixaban  2.5 mg Oral BID   atorvastatin  20 mg Oral Daily   budesonide (PULMICORT) nebulizer solution  0.25 mg Nebulization BID   Chlorhexidine Gluconate Cloth  6 each Topical Q0600   darbepoetin (ARANESP) injection - DIALYSIS  60 mcg Subcutaneous Q Tue-1800   doxycycline  100 mg Oral Q12H    famotidine  20 mg Oral QHS   fluticasone  1 spray Each Nare Daily   hydrALAZINE  25 mg Oral TID   insulin aspart  0-6 Units Subcutaneous TID WC   insulin glargine-yfgn  5 Units Subcutaneous Daily   isosorbide mononitrate  60 mg Oral Daily   latanoprost  1 drop Both Eyes QHS   loratadine  10 mg Oral Daily   metoprolol succinate  12.5 mg Oral Daily   Continuous Infusions:  cefTRIAXone (ROCEPHIN)  IV       LOS: 8 days   Time spent: 45 minutes  Tarez Bowns,MD [facility]  If 7PM-7AM, please contact night-coverage [email or web address] 12/12/2022, 4:37 PM

## 2022-12-12 NOTE — Progress Notes (Signed)
  HEMODIALYSIS TREATMENT NOTE (HD#4):  Pre-HD pt was in recliner in her room.  Daughter Graciella Belton) said pt had been sitting up for 15 minutes, having just completed PT.  Pt made/kept eye contact and spoke in full sentences.  Oriented to person, place.  She was transported to [facility] to dialyze in her recliner since this will be the expectation at outpatient center.  40 minutes into treatment pt began asking "how much longer do I have to stay here?"  She was repositioned several times, propped with pillows, given warm blankets, a fan, sips of water, cold damp washcloth.  Hydroxizine 10mg  given at daughter's request for restlessness and hot flashes.  Repeated: "Lord have mercy."  "I've got to get up."  "Help me."  "Give me something." "You've got to get me up." "How much longer?"   Daughters were very encouraging and attentive.   3.5 hour session completed. Hemodynamically stable; tolerated removal of 2 liters.    12/12/22 1515  Vitals  Temp 97.9 F (36.6 C)  Temp Source Oral  BP (!) 159/70  MAP (mmHg) 96  BP Location Left Arm  BP Method Automatic  Patient Position (if appropriate) Sitting  Pulse Rate 88  Pulse Rate Source Monitor  ECG Heart Rate 87  Resp 17  Oxygen Therapy  SpO2 98 %  O2 Device Nasal Cannula  O2 Flow Rate (L/min) 1 L/min  Post Treatment  Dialyzer Clearance Lightly streaked  Hemodialysis Intake (mL) 0 mL  Liters Processed 61.7  Fluid Removed (mL) 2000 mL  Tolerated HD Treatment Yes  Post-Hemodialysis Comments Goal met  Hemodialysis Catheter Right Internal jugular Double lumen Permanent (Tunneled)  Placement Date/Time: 12/07/22 1215   Placed prior to admission: No  Serial / Lot #: [id number]  Expiration Date: 10/15/25  Time Out: Correct patient;Correct site;Correct procedure  Maximum sterile barrier precautions: Hand hygiene;Cap;Mask;Sterile gow...  Site Condition No complications  Blue Lumen Status Flushed;Heparin locked;Dead end cap in place  Red Lumen Status  Flushed;Heparin locked;Dead end cap in place  Purple Lumen Status N/A  Catheter fill solution Heparin 1000 units/ml  Catheter fill volume (Arterial) 1.6 cc  Catheter fill volume (Venous) 1.6  Dressing Type Transparent;Tube stabilization device  Dressing Status Antimicrobial disc in place;Clean, Dry, Intact  Interventions New dressing  Drainage Description None  Dressing Change Due 12/19/22  Post treatment catheter status Capped and Clamped    Arman Filter, RN AP [facility]

## 2022-12-12 NOTE — Progress Notes (Signed)
Bipap is PRN order; not needed at this time.  No distress noted.

## 2022-12-12 NOTE — Progress Notes (Signed)
Patient ID: [name] [name], female   DOB: 1941-03-20, 82 y.o.   MRN: [id number] S: Feeling better.  More awake and alert.  Family at bedside report good appetite this morning. O:BP (!) 169/65 (BP Location: Left Arm)   Pulse 78   Temp (!) 97.5 F (36.4 C) (Oral)   Resp (!) 24   Ht 5\' 2"  (1.575 m)   Wt 71.8 kg   SpO2 94%   BMI 28.95 kg/m   Intake/Output Summary (Last 24 hours) at 12/12/2022 1011 Last data filed at 12/12/2022 0946 Gross per 24 hour  Intake 840 ml  Output --  Net 840 ml   Intake/Output: I/O last 3 completed shifts: In: 840 [P.O.:240; IV Piggyback:600] Out: 380 [Urine:380]  Intake/Output this shift:  Total I/O In: 240 [P.O.:240] Out: -  Weight change:  Gen: NAD CVS: RRR Resp:CTA Abd: +BS, soft, NT/ND Ext: minimal pretibial edema bilaterally L>R  Recent Labs  Lab 12/06/22 0404 12/07/22 2000 12/08/22 0447 12/09/22 0410 12/10/22 0429 12/11/22 0420 12/12/22 0609  NA 136 139 137 136 136 136 135  K 3.5 2.9* 2.6* 3.1* 3.7 3.7 4.1  CL 108 104 103 99 98 99 97*  CO2 13* 21* 26 26 27 24  21*  GLUCOSE 140* 139* 83 113* 138* 177* 208*  BUN 98* 84* 59* 38* 30* 42* 52*  CREATININE 9.81* 8.82* 6.38* 4.62* 3.62* 4.43* 5.05*  ALBUMIN  --  2.2* 2.3* 2.2* 2.1* 2.0* 2.4*  CALCIUM 8.3* 7.7* 7.7* 7.8* 8.1* 8.5* 8.5*  PHOS  --  6.7* 5.1* 4.0 3.9 5.4* 4.9*   Liver Function Tests: Recent Labs  Lab 12/10/22 0429 12/11/22 0420 12/12/22 0609  ALBUMIN 2.1* 2.0* 2.4*   No results for input(s): "LIPASE", "AMYLASE" in the last 168 hours. No results for input(s): "AMMONIA" in the last 168 hours. CBC: Recent Labs  Lab 12/06/22 0404 12/07/22 2000 12/08/22 0447 12/09/22 0410  WBC 10.6* 11.4* 14.7* 18.0*  HGB 8.3* 8.1* 8.8* 9.0*  HCT 25.4* 24.4* 26.6* 27.4*  MCV 89.1 88.7 87.8 89.0  PLT 417* 436* 421* 446*   Cardiac Enzymes: No results for input(s): "CKTOTAL", "CKMB", "CKMBINDEX", "TROPONINI" in the last 168 hours. CBG: Recent Labs  Lab 12/11/22 1632  12/11/22 2003 12/12/22 0019 12/12/22 0416 12/12/22 0725  GLUCAP 225* 190* 165* 173* 206*    Iron Studies: No results for input(s): "IRON", "TIBC", "TRANSFERRIN", "FERRITIN" in the last 72 hours. Studies/Results: DG CHEST PORT 1 VIEW  Result Date: 12/11/2022 CLINICAL DATA:  Increased shortness of breath. EXAM: PORTABLE CHEST 1 VIEW COMPARISON:  Radiographs 12/07/2022 and 12/06/2022. FINDINGS: 0802 hours. Right IJ hemodialysis catheter projects to the level of the superior cavoatrial junction. The heart size and mediastinal contours are stable with aortic atherosclerosis. There are increased bibasilar pulmonary opacities with probable enlarging pleural effusions and mild pulmonary edema. No pneumothorax. The bones appear unchanged. Telemetry leads overlie the chest. IMPRESSION: Increasing bibasilar pulmonary opacities, likely a combination of enlarging pleural effusions and pulmonary edema. Findings suggest congestive heart failure. Electronically Signed   By: Carey Bullocks M.D.   On: 12/11/2022 11:33    amLODipine  5 mg Oral Daily   apixaban  2.5 mg Oral BID   atorvastatin  20 mg Oral Daily   budesonide (PULMICORT) nebulizer solution  0.25 mg Nebulization BID   Chlorhexidine Gluconate Cloth  6 each Topical Q0600   darbepoetin (ARANESP) injection - DIALYSIS  60 mcg Subcutaneous Q Tue-1800   doxycycline  100 mg Oral Q12H   famotidine  20 mg Oral QHS   fluticasone  1 spray Each Nare Daily   hydrALAZINE  25 mg Oral TID   insulin aspart  0-6 Units Subcutaneous TID WC   insulin glargine-yfgn  5 Units Subcutaneous Daily   isosorbide mononitrate  60 mg Oral Daily   latanoprost  1 drop Both Eyes QHS   loratadine  10 mg Oral Daily   metoprolol succinate  12.5 mg Oral Daily    BMET    Component Value Date/Time   NA 135 12/12/2022 0609   NA 142 06/26/2022 0000   K 4.1 12/12/2022 0609   CL 97 (L) 12/12/2022 0609   CO2 21 (L) 12/12/2022 0609   GLUCOSE 208 (H) 12/12/2022 0609   BUN 52 (H)  12/12/2022 0609   BUN 51 (A) 06/26/2022 0000   CREATININE 5.05 (H) 12/12/2022 0609   CREATININE 1.95 (H) 10/09/2018 0905   CALCIUM 8.5 (L) 12/12/2022 0609   GFRNONAA 8 (L) 12/12/2022 0609   GFRNONAA 24 (L) 10/09/2018 0905   GFRAA 20 (L) 03/15/2020 1149   GFRAA 27 (L) 10/09/2018 0905   CBC    Component Value Date/Time   WBC 18.0 (H) 12/09/2022 0410   RBC 3.08 (L) 12/09/2022 0410   HGB 9.0 (L) 12/09/2022 0410   HGB 9.1 (L) 05/30/2022 1001   HCT 27.4 (L) 12/09/2022 0410   HCT 28.3 (L) 05/30/2022 1001   PLT 446 (H) 12/09/2022 0410   PLT 357 05/30/2022 1001   MCV 89.0 12/09/2022 0410   MCV 91 05/30/2022 1001   MCH 29.2 12/09/2022 0410   MCHC 32.8 12/09/2022 0410   RDW 15.9 (H) 12/09/2022 0410   RDW 12.7 05/30/2022 1001   LYMPHSABS 1.5 10/16/2022 1438   MONOABS 0.8 10/16/2022 1438   EOSABS 0.2 10/16/2022 1438   BASOSABS 0.1 10/16/2022 1438    Assessment/Plan:   Acute hypoxic respiratory failure - possible pneumonia vs volume overload.  S/p thoracentesis of 700 mL 12/06/22 with some improvement.  Has improved with serial HD and UF.  Plan for HD today with UF. CKD stage V - pt has been followed in our office for quite some time and she has always stated that she would rather die than go on dialysis, however it appears that her family has changed her mind.  We discussed the need for [facility] and eventually an AVF/AVG.  We also discussed home dialysis with PD and HHD.  Family is interested.  She had TDC placed 12/07/22 by Dr. Henreitta Leber and had her first HD session on 8/29 then followed by HD on 8/30 and 8/31.  Plan for HD today to get on her TTS outpatient schedule.  Tentatively has TTS spot with 10:30 appointment time.  Appreciate assistance of SW/CM.  Avoid nephrotoxic medications including NSAIDs and iodinated intravenous contrast exposure unless the latter is absolutely indicated. Preferred narcotic agents for pain control are hydromorphone, fentanyl, and methadone. Morphine should not be used.  Avoid Baclofen and avoid oral sodium phosphate and magnesium citrate based laxatives / bowel preps. Continue strict Input and Output monitoring.  Will monitor the patient closely with you and intervene or adjust therapy as indicated by changes in clinical status/labs  Left leg cellulitis - with wound.  Currently on rocephin and doxycycline. Anemia of CKD stage V - started on Aranesp.  Transfuse for Hb <7, TSAT 29% will recheck.  DM type 2 - per primary Hypokalemia - given IV KCl and will use added K bath.  HTN/Volume - has some volume overload.  I's/O's not recorded.  Will UF with HD after TDC placed.  Debilitation - will likely require SNF for short term rehab given her poor functional status. Disposition - pt's family would like to go to [facility] as it is closer to her home.  SW/CM will need to arrange for acceptance at that facility prior to discharge. Tentative chair time TTS at 10:30 am and awaiting SNF placement due to deconditioning.   Irena Cords, MD [employer] [phone]

## 2022-12-13 ENCOUNTER — Other Ambulatory Visit (HOSPITAL_COMMUNITY): Payer: Self-pay

## 2022-12-13 ENCOUNTER — Telehealth: Payer: Self-pay

## 2022-12-13 ENCOUNTER — Ambulatory Visit: Payer: Medicare Other | Admitting: Internal Medicine

## 2022-12-13 ENCOUNTER — Encounter: Payer: Self-pay | Admitting: Nephrology

## 2022-12-13 DIAGNOSIS — J189 Pneumonia, unspecified organism: Secondary | ICD-10-CM | POA: Diagnosis not present

## 2022-12-13 DIAGNOSIS — E43 Unspecified severe protein-calorie malnutrition: Secondary | ICD-10-CM | POA: Insufficient documentation

## 2022-12-13 DIAGNOSIS — E782 Mixed hyperlipidemia: Secondary | ICD-10-CM | POA: Diagnosis not present

## 2022-12-13 DIAGNOSIS — S81802D Unspecified open wound, left lower leg, subsequent encounter: Secondary | ICD-10-CM | POA: Diagnosis not present

## 2022-12-13 DIAGNOSIS — J9601 Acute respiratory failure with hypoxia: Secondary | ICD-10-CM | POA: Diagnosis not present

## 2022-12-13 LAB — CBC
HCT: 23.7 % — ABNORMAL LOW (ref 36.0–46.0)
Hemoglobin: 7.7 g/dL — ABNORMAL LOW (ref 12.0–15.0)
MCH: 29.2 pg (ref 26.0–34.0)
MCHC: 32.5 g/dL (ref 30.0–36.0)
MCV: 89.8 fL (ref 80.0–100.0)
Platelets: 406 10*3/uL — ABNORMAL HIGH (ref 150–400)
RBC: 2.64 MIL/uL — ABNORMAL LOW (ref 3.87–5.11)
RDW: 15.9 % — ABNORMAL HIGH (ref 11.5–15.5)
WBC: 11.4 10*3/uL — ABNORMAL HIGH (ref 4.0–10.5)
nRBC: 0.2 % (ref 0.0–0.2)

## 2022-12-13 LAB — IRON AND TIBC
Iron: 26 ug/dL — ABNORMAL LOW (ref 28–170)
Saturation Ratios: 16 % (ref 10.4–31.8)
TIBC: 159 ug/dL — ABNORMAL LOW (ref 250–450)
UIBC: 133 ug/dL

## 2022-12-13 LAB — RENAL FUNCTION PANEL
Albumin: 2.4 g/dL — ABNORMAL LOW (ref 3.5–5.0)
Anion gap: 19 — ABNORMAL HIGH (ref 5–15)
BUN: 29 mg/dL — ABNORMAL HIGH (ref 8–23)
CO2: 21 mmol/L — ABNORMAL LOW (ref 22–32)
Calcium: 8.3 mg/dL — ABNORMAL LOW (ref 8.9–10.3)
Chloride: 95 mmol/L — ABNORMAL LOW (ref 98–111)
Creatinine, Ser: 3.23 mg/dL — ABNORMAL HIGH (ref 0.44–1.00)
GFR, Estimated: 14 mL/min — ABNORMAL LOW (ref >60)
Glucose, Bld: 233 mg/dL — ABNORMAL HIGH (ref 70–99)
Phosphorus: 3.5 mg/dL (ref 2.5–4.6)
Potassium: 4.1 mmol/L (ref 3.5–5.1)
Sodium: 135 mmol/L (ref 135–145)

## 2022-12-13 LAB — GLUCOSE, CAPILLARY
Glucose-Capillary: 144 mg/dL — ABNORMAL HIGH (ref 70–99)
Glucose-Capillary: 176 mg/dL — ABNORMAL HIGH (ref 70–99)
Glucose-Capillary: 179 mg/dL — ABNORMAL HIGH (ref 70–99)
Glucose-Capillary: 210 mg/dL — ABNORMAL HIGH (ref 70–99)
Glucose-Capillary: 215 mg/dL — ABNORMAL HIGH (ref 70–99)
Glucose-Capillary: 215 mg/dL — ABNORMAL HIGH (ref 70–99)

## 2022-12-13 MED ORDER — NEPRO/CARBSTEADY PO LIQD
237.0000 mL | Freq: Three times a day (TID) | ORAL | Status: DC
  Administered 2022-12-13 – 2022-12-18 (×9): 237 mL via ORAL

## 2022-12-13 MED ORDER — SODIUM CHLORIDE 0.9 % IV SOLN
200.0000 mg | INTRAVENOUS | Status: AC
  Filled 2022-12-13 (×2): qty 10

## 2022-12-13 MED ORDER — CEFDINIR 300 MG PO CAPS
300.0000 mg | ORAL_CAPSULE | ORAL | Status: AC
  Administered 2022-12-13: 300 mg via ORAL
  Filled 2022-12-13: qty 1

## 2022-12-13 MED ORDER — RENA-VITE PO TABS
1.0000 | ORAL_TABLET | Freq: Every day | ORAL | Status: DC
  Administered 2022-12-13 – 2022-12-17 (×5): 1 via ORAL
  Filled 2022-12-13 (×5): qty 1

## 2022-12-13 NOTE — Telephone Encounter (Signed)
Received file from [facility], unsure if pt assistance is still required.

## 2022-12-13 NOTE — Care Management Important Message (Signed)
Important Message  Patient Details  Name: ANAILI SROUFE MRN: [id number] Date of Birth: [birth year]   Medicare Important Message Given:  Yes     Corey Harold 12/13/2022, 11:27 AM

## 2022-12-13 NOTE — Progress Notes (Signed)
Initial Nutrition Assessment  DOCUMENTATION CODES:   Severe malnutrition in context of acute illness/injury  INTERVENTION:   Nepro Shake po TID, each supplement provides 425 kcal and 19 grams protein.  Renal MVI daily.  Liberalize diet to CHO modified, low sodium with 1500 ml fluid restriction.   NUTRITION DIAGNOSIS:   Severe Malnutrition related to acute illness (AKI) as evidenced by moderate fat depletion, moderate muscle depletion, energy intake < or equal to 50% for > or equal to 5 days. Suspect malnutrition has a chronic component as well given long hx of CKD, HF, DM.   GOAL:   Patient will meet greater than or equal to 90% of their needs  MONITOR:   PO intake, Supplement acceptance, Labs, Skin  REASON FOR ASSESSMENT:   Consult Diet education (new to HD)  ASSESSMENT:   82 yo female admitted with AKI on CKD, metabolic acidosis, LLE wound/cellulitis. PMH includes HF, DM-2 insulin dependent, CKD stage IV, HTN, HLD, chronic anemia, CVA.  Spoke with patient's daughter at bedside who reports patient has been eating poorly since admission. She has a poor appetite related to antibiotics as well as acute illness. She has not tried the Nepro shake supplements yet, but daughter agrees that a PO supplement is a good idea to help patient meet her nutrition needs. Patient has had a few HD sessions since admission. This is new for her.   Reviewed renal diet guidelines with patient's daughter, focusing on ensuring adequate protein intake and limiting dietary sodium. Provided handout from the Academy of [employer]. Given recent poor intake with normal serum phosphorus and potassium levels, dietary restrictions of potassium and phosphorus are not needed at this time.   Currently on a renal CHO modified diet with 1500 ml fluid restriction. Since admission has been consuming 0-75% of meals, mostly </= 50%. RD to liberalize diet to help increase PO intake.   LLE wound covered  with bandage, small area.   Labs reviewed. BUN 29, Creatinine 3.23, Phos 3.5, Potassium 4.1 CBG: [phone]  Medications reviewed and include Aranesp, Pepcid, Novolog SSI TID with meals, Semglee 5 units daily, IV iron.  Weight history reviewed. Difficult to assess weight changes given ongoing edema and fluid shifts associated with dialysis.   NUTRITION - FOCUSED PHYSICAL EXAM:  Flowsheet Row Most Recent Value  Orbital Region Moderate depletion  Upper Arm Region Moderate depletion  Thoracic and Lumbar Region Mild depletion  Buccal Region Moderate depletion  Temple Region Moderate depletion  Clavicle Bone Region Moderate depletion  Clavicle and Acromion Bone Region Mild depletion  Scapular Bone Region Mild depletion  Dorsal Hand Moderate depletion  Patellar Region Mild depletion  Anterior Thigh Region Mild depletion  Posterior Calf Region Moderate depletion  Edema (RD Assessment) Mild  Hair Reviewed  Eyes Unable to assess  Mouth Reviewed  Skin Reviewed  Nails Reviewed       Diet Order:   Diet Order             Diet renal/carb modified with fluid restriction Diet-HS Snack? Nothing; Fluid restriction: 1500 mL Fluid; Room service appropriate? Yes; Fluid consistency: Thin  Diet effective now                   EDUCATION NEEDS:   Education needs have been addressed  Skin:  Skin Assessment: Skin Integrity Issues: Skin Integrity Issues:: Other (Comment) Other: cellulitis / wound pretibial LLE  Last BM:  9/3  Height:   Ht Readings from Last 1 Encounters:  12/07/22 5\' 2"  (1.575 m)    Weight:   Wt Readings from Last 1 Encounters:  12/13/22 69.5 kg  Admission weight 64.4 kg (12/04/22)  Ideal Body Weight:  50 kg  BMI:  Body mass index is 28.02 kg/m.  Estimated Nutritional Needs:   Kcal:  1500-1750  Protein:  85-100 gm  Fluid:  1 L + UOP   Gabriel Rainwater RD, LDN, CNSC Please refer to Amion for contact information.

## 2022-12-13 NOTE — Addendum Note (Signed)
Addended by: Marin Shutter E on: 12/13/2022 01:48 PM   Modules accepted: Orders

## 2022-12-13 NOTE — TOC Progression Note (Signed)
Transition of Care [facility]) - Progression Note    Patient Details  Name: [name] [name] MRN: [id number] Date of Birth: 1940/06/01  Transition of Care [facility] CM/SW Contact  Elliot Gault, LCSW Phone Number: 12/13/2022, 12:39 PM  Clinical Narrative:     TOC following. Pt has insurance auth for SNF (good through 9/7) and [facility] can accept pt today. However, Magazine features editor has reviewed pt's record and he does not feel that pt is medically stable for outpatient HD based on her documented behaviors during her sessions here in the hospital. He would like to see how her session tomorrow goes. Also, [facility] does not take new start HD patients on Saturdays so the very earliest they can start pt is on Tuesday 9/10 and only if they feel she is appropriate for outpatient HD at that time.   Updated MD, daughter, and Eunice Blase at [facility]. Will follow.   Expected Discharge Plan: Skilled Nursing Facility Barriers to Discharge: Continued Medical Work up  Expected Discharge Plan and Services In-house Referral: Clinical Social Work     Living arrangements for the past 2 months: Single Family Home                                       Social Determinants of Health (SDOH) Interventions SDOH Screenings   Food Insecurity: No Food Insecurity (12/04/2022)  Housing: Low Risk  (12/04/2022)  Transportation Needs: No Transportation Needs (12/04/2022)  Utilities: Not At Risk (12/04/2022)  Depression (PHQ2-9): Low Risk  (08/17/2022)  Financial Resource Strain: Low Risk  (10/19/2021)  Physical Activity: Inactive (10/19/2021)  Stress: No Stress Concern Present (10/19/2021)  Tobacco Use: Low Risk  (12/07/2022)    Readmission Risk Interventions    12/05/2022    3:37 PM  Readmission Risk Prevention Plan  Transportation Screening Complete  HRI or Home Care Consult Complete  Social Work Consult for Recovery Care Planning/Counseling Complete  Palliative Care  Screening Complete  Medication Review Oceanographer) Complete

## 2022-12-13 NOTE — Progress Notes (Signed)
PROGRESS NOTE    [name]  ZOX:[id number] DOB: October 10, 1940 DOA: 12/04/2022 PCP: Dorothyann Peng, MD   Brief Narrative:    [name] [name] is a 82 y.o. female with medical history significant for HFpEF, type 2 diabetes insulin-dependent, CKD stage IV, hypertension, dyslipidemia, chronic anemia of CKD, and prior CVA who presented to the ED with worsening shortness of breath as well as dry cough that began last night.  Patient was admitted with acute hypoxemic respiratory failure-multifactorial in the setting of volume overload as well as suspected pneumonia.  She is also noted to have worsening AKI on CKD stage IV and nephrology recommending hemodialysis, family now appears agreeable.  She is noted to have pleural effusion and will undergo thoracentesis today.  Anticipate discharge to skilled nursing facility pending bed availability in the next 24-48 hours.  Assessment & Plan:   Principal Problem:   Acute hypoxemic respiratory failure (HCC) Active Problems:   Chronic kidney disease (CKD), stage IV (severe) (HCC)   Essential hypertension   Mixed hyperlipidemia   Anemia due to chronic kidney disease   Diabetic retinopathy associated with type 2 diabetes mellitus (HCC)   Wound of left leg   End stage renal disease (HCC)   Protein-calorie malnutrition, severe  Assessment and Plan:  Acute hypoxemic respiratory failure-likely related to possible pneumonia -Off BiPAP; status post thoracentesis. -Left lower lobe atelectasis versus effusion with possible pneumonia. -no signs of acute infection currently. -Scheduled breathing treatments, flutter valve and incentive spirometry has been encouraged. -Continue to wean off oxygen supplementation as needed. Down to 1 L [place] -Antibiotics has been transitioned to oral route; she will complete treatment after today's dosage.   AKI on CKD stage IV to V with metabolic acidosis Poor oral intake over the last 1 week noted Family now appears  agreeable for hemodialysis and this will be initiated per nephrology when appropriate -Status post dialysis catheter placement; and back-to-back initiation treatments (8/29>>8/30>>8/31); social worker aware to assist with outpatient dialysis chair.   -Patient so far has tolerated dialysis treatment; following available spot in the outpatient setting ([facility] dialysis Tuesday-Thursday and Saturday); status post hemodialysis on 12/12/2022. -The plan is for dialysis again 12/14/2022; will assess tolerance. -Continue to closely follow daily weights, volume status and response to HD. -Continue to follow nephrology service recommendation; appreciate their assistance in care.   Left lower extremity cellulitis/wound -Continue to follow wound care as recommended by wound care service.   -Continue current antibiotics with Rocephin and doxycycline. -DVT evaluation negative.   Elevated BNP -Does not appear to be related to acute CHF -Likely elevated in the setting of AKI on CKD stage IV-V patient a transition point currently based on GFR. -Continue to follow daily weights and strict I's and O's. -At the moment we will continue volume management with dialysis treatments. -Continue low-sodium diet.   Anemia of CKD -Receives Retacrit injections outpatient with last dose 8/5 -No overt bleeding appreciated. -Continue to monitor with repeat CBC, no overt bleeding noted -Transfuse for hemoglobin less than 7 -Dose of Aranesp 60 mcg per nephrology -Further treatment with Epogen and IV iron as per nephrology discretion. -Continue to follow hemoglobin trend. -No overt bleeding reported or appreciated.   DM2 with mild hyperglycemia/hypoglycemia -Continue on carb modified diet -Continue to follow CBGs fluctuation and adjust hypoglycemic regimen as needed. -Now the patient has been started on dialysis mild hypoglycemic events appreciated; insulin pump has been discontinue and the sliding scale insulin along with  low-dose Semglee has been  started.   Hypertension -Continue home antihypertensive agents -Continue as needed hydralazine -Follow vital signs -Anticipate improvement with dialysis -Continue to follow vital signs.   Dyslipidemia -Continue atorvastatin -Heart healthy diet discussed with patient.   PAF -Continue Eliquis for secondary prevention. -Continue metoprolol -Normal sinus rhythm per telemetry evaluation. -Continue to follow electrolytes.   HFpEF Continue metoprolol Repeat 2D echocardiogram with previous 03/2021 with LVEF 65-70% with grade 1 diastolic dysfunction and moderate LVH Noted to have elevated BNP, but more likely related to significant renal dysfunction. -At this moment after discussing with nephrologist and following further deterioration in her renal function plan is to initiate dialysis -Dialysis catheter has been placed and ready to use (appreciate assistance and care by Dr. Henreitta Leber). -Continue volume management with dialysis treatment.  Hypokalemia -Continue to have electrolytes repleted throughout hemodialysis -Potassium will continue to be stabilized with hemodialysis treatment. -No abnormalities appreciated on telemetry.  Presumed dysphagia -Speech therapy has been consulted and will follow recommendations.   DVT prophylaxis: Continue Eliquis. Code Status: Full Family Communication: Son and daughter at bedside. Disposition Plan:  Status is: Inpatient  Remains inpatient appropriate because: Need for IV antibiotics and inpatient procedures.   Consultants:  Nephrology Palliative care  Procedures:  Ultrasound thoracentesis pending 8/27  Antimicrobials:  Anti-infectives (From admission, onward)    Start     Dose/Rate Route Frequency Ordered Stop   12/13/22 1600  cefdinir (OMNICEF) capsule 300 mg        300 mg Oral Every 24 hours 12/13/22 1445 12/14/22 1559   12/12/22 2200  doxycycline (VIBRA-TABS) tablet 100 mg        100 mg Oral Every 12  hours 12/12/22 0930 12/14/22 0759   12/12/22 1500  cefTRIAXone (ROCEPHIN) 1 g in sodium chloride 0.9 % 100 mL IVPB  Status:  Discontinued        1 g 200 mL/hr over 30 Minutes Intravenous Every 24 hours 12/12/22 0930 12/13/22 1445   12/07/22 1045  ceFAZolin (ANCEF) IVPB 2g/100 mL premix  Status:  Discontinued        2 g 200 mL/hr over 30 Minutes Intravenous On call to O.R. 12/07/22 8657 12/07/22 1333   12/05/22 2200  doxycycline (VIBRAMYCIN) 100 mg in sodium chloride 0.9 % 250 mL IVPB  Status:  Discontinued        100 mg 125 mL/hr over 120 Minutes Intravenous Every 12 hours 12/04/22 1526 12/12/22 0930   12/05/22 1500  cefTRIAXone (ROCEPHIN) 1 g in sodium chloride 0.9 % 100 mL IVPB  Status:  Discontinued        1 g 200 mL/hr over 30 Minutes Intravenous Every 24 hours 12/04/22 1526 12/12/22 0930   12/04/22 1345  cefTRIAXone (ROCEPHIN) 1 g in sodium chloride 0.9 % 100 mL IVPB        1 g 200 mL/hr over 30 Minutes Intravenous  Once 12/04/22 1335 12/04/22 1537   12/04/22 1345  doxycycline (VIBRA-TABS) tablet 100 mg        100 mg Oral  Once 12/04/22 1335 12/04/22 1507      Subjective: Chronically ill, weak, deconditioned and experiencing significant agitation process overnight after dialysis.  Per family member she was not able to sleep and ended requiring anxiolytics to calm her down.  Patient was easily aroused, but very sleepy and not taking orals safely at the moment.  Objective: Vitals:   12/13/22 0406 12/13/22 0500 12/13/22 0736 12/13/22 1300  BP: (!) 156/62   120/68  Pulse: 87   77  Resp:      Temp: 97.6 F (36.4 C)   98.2 F (36.8 C)  TempSrc: Oral   Oral  SpO2: 96%  97% 94%  Weight:  69.5 kg    Height:        Intake/Output Summary (Last 24 hours) at 12/13/2022 1714 Last data filed at 12/13/2022 1327 Gross per 24 hour  Intake 820 ml  Output 300 ml  Net 520 ml   Filed Weights   12/12/22 0500 12/12/22 1120 12/13/22 0500  Weight: 71.8 kg 71.8 kg 69.5 kg     Examination: General exam: Easily aroused; sleepy after a very agitated and tiring night.  Patient require anxiolytic medication to help her rest. Respiratory system: 1 L nasal cannula in place; oxygen saturation 97%.  No using accessory muscle.  No rales, no using accessory muscle. Cardiovascular system: Rate controlled, no rubs, no gallops, no JVD on exam. Gastrointestinal system: Abdomen is nondistended, soft and nontender. No organomegaly or masses felt. Normal bowel sounds heard. Central nervous system: Moving 4 limbs spontaneously.  No focal neurological deficits. Extremities: No cyanosis or clubbing; left lower extremity without erythematous changes and 0 drainage out of adequately healing anterior shin wound. Skin: No petechiae. Psychiatry: Limited examination. Mood & affect appropriate at time of examination..    Data Reviewed: I have personally reviewed following labs and imaging studies  CBC: Recent Labs  Lab 12/07/22 2000 12/08/22 0447 12/09/22 0410 12/12/22 1405 12/13/22 0500  WBC 11.4* 14.7* 18.0* 13.8* 11.4*  HGB 8.1* 8.8* 9.0* 8.0* 7.7*  HCT 24.4* 26.6* 27.4* 24.5* 23.7*  MCV 88.7 87.8 89.0 87.8 89.8  PLT 436* 421* 446* 474* 406*   Basic Metabolic Panel: Recent Labs  Lab 12/09/22 0410 12/10/22 0429 12/11/22 0420 12/12/22 0609 12/13/22 0500  NA 136 136 136 135 135  K 3.1* 3.7 3.7 4.1 4.1  CL 99 98 99 97* 95*  CO2 26 27 24  21* 21*  GLUCOSE 113* 138* 177* 208* 233*  BUN 38* 30* 42* 52* 29*  CREATININE 4.62* 3.62* 4.43* 5.05* 3.23*  CALCIUM 7.8* 8.1* 8.5* 8.5* 8.3*  PHOS 4.0 3.9 5.4* 4.9* 3.5   GFR: Estimated Creatinine Clearance: 11.9 mL/min (A) (by C-G formula based on SCr of 3.23 mg/dL (H)).  CBG: Recent Labs  Lab 12/13/22 0007 12/13/22 0408 12/13/22 0808 12/13/22 1154 12/13/22 1610  GLUCAP 179* 215* 215* 210* 144*    Recent Results (from the past 240 hour(s))  Resp panel by RT-PCR (RSV, Flu A&B, Covid) Anterior Nasal Swab     Status:  None   Collection Time: 12/04/22 12:26 PM   Specimen: Anterior Nasal Swab  Result Value Ref Range Status   SARS Coronavirus 2 by RT PCR NEGATIVE NEGATIVE Final    Comment: (NOTE) SARS-CoV-2 target nucleic acids are NOT DETECTED.  The SARS-CoV-2 RNA is generally detectable in upper respiratory specimens during the acute phase of infection. The lowest concentration of SARS-CoV-2 viral copies this assay can detect is 138 copies/mL. A negative result does not preclude SARS-Cov-2 infection and should not be used as the sole basis for treatment or other patient management decisions. A negative result may occur with  improper specimen collection/handling, submission of specimen other than nasopharyngeal swab, presence of viral mutation(s) within the areas targeted by this assay, and inadequate number of viral copies(<138 copies/mL). A negative result must be combined with clinical observations, patient history, and epidemiological information. The expected result is Negative.  Fact Sheet for Patients:  BloggerCourse.com  Fact Sheet for  Healthcare Providers:  SeriousBroker.it  This test is no t yet approved or cleared by the [place] and  has been authorized for detection and/or diagnosis of SARS-CoV-2 by FDA under an Emergency Use Authorization (EUA). This EUA will remain  in effect (meaning this test can be used) for the duration of the COVID-19 declaration under Section 564(b)(1) of the Act, 21 U.S.C.section 360bbb-3(b)(1), unless the authorization is terminated  or revoked sooner.       Influenza A by PCR NEGATIVE NEGATIVE Final   Influenza B by PCR NEGATIVE NEGATIVE Final    Comment: (NOTE) The Xpert Xpress SARS-CoV-2/FLU/RSV plus assay is intended as an aid in the diagnosis of influenza from Nasopharyngeal swab specimens and should not be used as a sole basis for treatment. Nasal washings and aspirates are unacceptable  for Xpert Xpress SARS-CoV-2/FLU/RSV testing.  Fact Sheet for Patients: BloggerCourse.com  Fact Sheet for Healthcare Providers: SeriousBroker.it  This test is not yet approved or cleared by the [place] FDA and has been authorized for detection and/or diagnosis of SARS-CoV-2 by FDA under an Emergency Use Authorization (EUA). This EUA will remain in effect (meaning this test can be used) for the duration of the COVID-19 declaration under Section 564(b)(1) of the Act, 21 U.S.C. section 360bbb-3(b)(1), unless the authorization is terminated or revoked.     Resp Syncytial Virus by PCR NEGATIVE NEGATIVE Final    Comment: (NOTE) Fact Sheet for Patients: BloggerCourse.com  Fact Sheet for Healthcare Providers: SeriousBroker.it  This test is not yet approved or cleared by the [place] FDA and has been authorized for detection and/or diagnosis of SARS-CoV-2 by FDA under an Emergency Use Authorization (EUA). This EUA will remain in effect (meaning this test can be used) for the duration of the COVID-19 declaration under Section 564(b)(1) of the Act, 21 U.S.C. section 360bbb-3(b)(1), unless the authorization is terminated or revoked.  Performed at [facility], [address], [place], [place] [address]   Gram stain     Status: None   Collection Time: 12/06/22  9:40 AM   Specimen: Pleura  Result Value Ref Range Status   Specimen Description PLEURAL  Final   Special Requests NONE  Final   Gram Stain   Final    CYTOSPIN SMEAR NO ORGANISMS SEEN WBC PRESENT,BOTH PMN AND MONONUCLEAR Performed at [facility], [address], [place], [place] [address]    Report Status 12/06/2022 FINAL  Final  Culture, body fluid w Gram Stain-bottle     Status: None   Collection Time: 12/06/22  9:40 AM   Specimen: Pleura  Result Value Ref Range Status   Specimen Description PLEURAL  Final    Special Requests 10CC BOTTLES DRAWN AEROBIC AND ANAEROBIC  Final   Culture   Final    NO GROWTH 5 DAYS Performed at [facility], [address], [place], [place] [address]    Report Status 12/11/2022 FINAL  Final  Surgical PCR screen     Status: None   Collection Time: 12/07/22  3:07 AM   Specimen: Nasal Mucosa; Nasal Swab  Result Value Ref Range Status   MRSA, PCR NEGATIVE NEGATIVE Final   Staphylococcus aureus NEGATIVE NEGATIVE Final    Comment: (NOTE) The Xpert SA Assay (FDA approved for NASAL specimens in patients 90 years of age and older), is one component of a comprehensive surveillance program. It is not intended to diagnose infection nor to guide or monitor treatment. Performed at [facility], [address] [place], [place]  [address]     Radiology Studies: No results found.  Scheduled Meds:  amLODipine  5 mg Oral Daily   apixaban  2.5 mg Oral BID   atorvastatin  20 mg Oral Daily   budesonide (PULMICORT) nebulizer solution  0.25 mg Nebulization BID   cefdinir  300 mg Oral Q24H   Chlorhexidine Gluconate Cloth  6 each Topical Q0600   darbepoetin (ARANESP) injection - DIALYSIS  60 mcg Subcutaneous Q Tue-1800   doxycycline  100 mg Oral Q12H   famotidine  20 mg Oral QHS   feeding supplement (NEPRO CARB STEADY)  237 mL Oral TID BM   fluticasone  1 spray Each Nare Daily   hydrALAZINE  25 mg Oral TID   influenza vaccine adjuvanted  0.5 mL Intramuscular Tomorrow-1000   insulin aspart  0-6 Units Subcutaneous TID WC   insulin glargine-yfgn  5 Units Subcutaneous Daily   isosorbide mononitrate  60 mg Oral Daily   latanoprost  1 drop Both Eyes QHS   loratadine  10 mg Oral Daily   metoprolol succinate  12.5 mg Oral Daily   multivitamin  1 tablet Oral QHS   Continuous Infusions:  iron sucrose       LOS: 9 days   Time spent: 45 minutes  Tasheka Houseman,MD [facility]  If 7PM-7AM, please contact night-coverage [email or web address] 12/13/2022, 5:14 PM

## 2022-12-13 NOTE — Progress Notes (Addendum)
Patient ID: [name] [name], female   DOB: 09-Jun-1940, 82 y.o.   MRN: [id number] S: Somnolent this morning.  Per daughter, she did not sleep well last night. O:BP (!) 156/62 (BP Location: Right Arm)   Pulse 87   Temp 97.6 F (36.4 C) (Oral)   Resp 17   Ht 5\' 2"  (1.575 m)   Wt 69.5 kg   SpO2 97%   BMI 28.02 kg/m   Intake/Output Summary (Last 24 hours) at 12/13/2022 1011 Last data filed at 12/13/2022 0500 Gross per 24 hour  Intake 340 ml  Output 2300 ml  Net -1960 ml   Intake/Output: I/O last 3 completed shifts: In: 1180 [P.O.:480; IV Piggyback:700] Out: 2300 [Urine:300; Other:2000]  Intake/Output this shift:  No intake/output data recorded. Weight change: 0 kg Gen: NAD CVS: RRR Resp:CTA Abd: +Bs, soft, NT/ND Ext: no edema  Recent Labs  Lab 12/07/22 2000 12/08/22 0447 12/09/22 0410 12/10/22 0429 12/11/22 0420 12/12/22 0609 12/13/22 0500  NA 139 137 136 136 136 135 135  K 2.9* 2.6* 3.1* 3.7 3.7 4.1 4.1  CL 104 103 99 98 99 97* 95*  CO2 21* 26 26 27 24  21* 21*  GLUCOSE 139* 83 113* 138* 177* 208* 233*  BUN 84* 59* 38* 30* 42* 52* 29*  CREATININE 8.82* 6.38* 4.62* 3.62* 4.43* 5.05* 3.23*  ALBUMIN 2.2* 2.3* 2.2* 2.1* 2.0* 2.4* 2.4*  CALCIUM 7.7* 7.7* 7.8* 8.1* 8.5* 8.5* 8.3*  PHOS 6.7* 5.1* 4.0 3.9 5.4* 4.9* 3.5   Liver Function Tests: Recent Labs  Lab 12/11/22 0420 12/12/22 0609 12/13/22 0500  ALBUMIN 2.0* 2.4* 2.4*   No results for input(s): "LIPASE", "AMYLASE" in the last 168 hours. No results for input(s): "AMMONIA" in the last 168 hours. CBC: Recent Labs  Lab 12/07/22 2000 12/08/22 0447 12/09/22 0410 12/12/22 1405 12/13/22 0500  WBC 11.4* 14.7* 18.0* 13.8* 11.4*  HGB 8.1* 8.8* 9.0* 8.0* 7.7*  HCT 24.4* 26.6* 27.4* 24.5* 23.7*  MCV 88.7 87.8 89.0 87.8 89.8  PLT 436* 421* 446* 474* 406*   Cardiac Enzymes: No results for input(s): "CKTOTAL", "CKMB", "CKMBINDEX", "TROPONINI" in the last 168 hours. CBG: Recent Labs  Lab 12/12/22 1604  12/12/22 1959 12/13/22 0007 12/13/22 0408 12/13/22 0808  GLUCAP 125* 214* 179* 215* 215*    Iron Studies:  Recent Labs    12/13/22 0500  IRON 26*  TIBC 159*   Studies/Results: No results found.  amLODipine  5 mg Oral Daily   apixaban  2.5 mg Oral BID   atorvastatin  20 mg Oral Daily   budesonide (PULMICORT) nebulizer solution  0.25 mg Nebulization BID   Chlorhexidine Gluconate Cloth  6 each Topical Q0600   darbepoetin (ARANESP) injection - DIALYSIS  60 mcg Subcutaneous Q Tue-1800   doxycycline  100 mg Oral Q12H   famotidine  20 mg Oral QHS   fluticasone  1 spray Each Nare Daily   hydrALAZINE  25 mg Oral TID   influenza vaccine adjuvanted  0.5 mL Intramuscular Tomorrow-1000   insulin aspart  0-6 Units Subcutaneous TID WC   insulin glargine-yfgn  5 Units Subcutaneous Daily   isosorbide mononitrate  60 mg Oral Daily   latanoprost  1 drop Both Eyes QHS   loratadine  10 mg Oral Daily   metoprolol succinate  12.5 mg Oral Daily    BMET    Component Value Date/Time   NA 135 12/13/2022 0500   NA 142 06/26/2022 0000   K 4.1 12/13/2022 0500   CL  95 (L) 12/13/2022 0500   CO2 21 (L) 12/13/2022 0500   GLUCOSE 233 (H) 12/13/2022 0500   BUN 29 (H) 12/13/2022 0500   BUN 51 (A) 06/26/2022 0000   CREATININE 3.23 (H) 12/13/2022 0500   CREATININE 1.95 (H) 10/09/2018 0905   CALCIUM 8.3 (L) 12/13/2022 0500   GFRNONAA 14 (L) 12/13/2022 0500   GFRNONAA 24 (L) 10/09/2018 0905   GFRAA 20 (L) 03/15/2020 1149   GFRAA 27 (L) 10/09/2018 0905   CBC    Component Value Date/Time   WBC 11.4 (H) 12/13/2022 0500   RBC 2.64 (L) 12/13/2022 0500   HGB 7.7 (L) 12/13/2022 0500   HGB 9.1 (L) 05/30/2022 1001   HCT 23.7 (L) 12/13/2022 0500   HCT 28.3 (L) 05/30/2022 1001   PLT 406 (H) 12/13/2022 0500   PLT 357 05/30/2022 1001   MCV 89.8 12/13/2022 0500   MCV 91 05/30/2022 1001   MCH 29.2 12/13/2022 0500   MCHC 32.5 12/13/2022 0500   RDW 15.9 (H) 12/13/2022 0500   RDW 12.7 05/30/2022 1001    LYMPHSABS 1.5 10/16/2022 1438   MONOABS 0.8 10/16/2022 1438   EOSABS 0.2 10/16/2022 1438   BASOSABS 0.1 10/16/2022 1438    Assessment/Plan:   Acute hypoxic respiratory failure - possible pneumonia vs volume overload.  S/p thoracentesis of 700 mL 12/06/22 with some improvement.  Has improved with serial HD and UF.  Plan for HD tomorrow with UF. CKD stage V - pt has been followed in our office for quite some time and she has always stated that she would rather die than go on dialysis, however it appears that her family has changed her mind.  We discussed the need for [facility] and eventually an AVF/AVG.  We also discussed home dialysis with PD and HHD.  Family is interested.  She had TDC placed 12/07/22 by Dr. Henreitta Leber and had her first HD session on 8/29 then followed by HD on 8/30 and 8/31.  She tolerated HD on 12/12/22 with UF of 2 liters.  Plan for HD tomorrow to stay on her TTS outpatient schedule.  Tentatively has TTS spot with 10:30 appointment time.  Appreciate assistance of SW/CM.  Avoid nephrotoxic medications including NSAIDs and iodinated intravenous contrast exposure unless the latter is absolutely indicated. Preferred narcotic agents for pain control are hydromorphone, fentanyl, and methadone. Morphine should not be used. Avoid Baclofen and avoid oral sodium phosphate and magnesium citrate based laxatives / bowel preps. Continue strict Input and Output monitoring.  Will monitor the patient closely with you and intervene or adjust therapy as indicated by changes in clinical status/labs  Left leg cellulitis - with wound.  Currently on rocephin and doxycycline. Anemia of CKD stage V - started on Aranesp.  Transfuse for Hb <7, TSAT 16% and will dose with IV Iron and follow.  DM type 2 - per primary Hypokalemia - given IV KCl and will use added K bath.  HTN/Volume - has some volume overload.  I's/O's not recorded.  Will UF with HD after TDC placed.  Debilitation - will likely require SNF for short term  rehab given her poor functional status. Disposition - pt's family would like to go to [facility] as it is closer to her home.  SW/CM will need to arrange for acceptance at that facility prior to discharge. Tentative chair time TTS at 10:30 am and awaiting SNF placement due to deconditioning.   Irena Cords, MD [facility]

## 2022-12-13 NOTE — Plan of Care (Signed)
  Problem: Education: Goal: Knowledge of General Education information will improve Description: Including pain rating scale, medication(s)/side effects and non-pharmacologic comfort measures Outcome: Progressing   Problem: Health Behavior/Discharge Planning: Goal: Ability to manage health-related needs will improve Outcome: Progressing   Problem: Coping: Goal: Level of anxiety will decrease Outcome: Progressing   Problem: Elimination: Goal: Will not experience complications related to bowel motility Outcome: Progressing   Problem: Skin Integrity: Goal: Risk for impaired skin integrity will decrease Outcome: Progressing   Problem: Education: Goal: Ability to describe self-care measures that may prevent or decrease complications (Diabetes Survival Skills Education) will improve Outcome: Progressing

## 2022-12-14 ENCOUNTER — Other Ambulatory Visit: Payer: Self-pay | Admitting: Internal Medicine

## 2022-12-14 ENCOUNTER — Inpatient Hospital Stay (HOSPITAL_COMMUNITY): Payer: Medicare Other

## 2022-12-14 DIAGNOSIS — J9601 Acute respiratory failure with hypoxia: Secondary | ICD-10-CM | POA: Diagnosis not present

## 2022-12-14 DIAGNOSIS — J189 Pneumonia, unspecified organism: Secondary | ICD-10-CM | POA: Diagnosis not present

## 2022-12-14 DIAGNOSIS — Z992 Dependence on renal dialysis: Secondary | ICD-10-CM

## 2022-12-14 DIAGNOSIS — S81802D Unspecified open wound, left lower leg, subsequent encounter: Secondary | ICD-10-CM | POA: Diagnosis not present

## 2022-12-14 DIAGNOSIS — E7800 Pure hypercholesterolemia, unspecified: Secondary | ICD-10-CM

## 2022-12-14 DIAGNOSIS — E782 Mixed hyperlipidemia: Secondary | ICD-10-CM | POA: Diagnosis not present

## 2022-12-14 LAB — RENAL FUNCTION PANEL
Albumin: 2.5 g/dL — ABNORMAL LOW (ref 3.5–5.0)
Anion gap: 18 — ABNORMAL HIGH (ref 5–15)
BUN: 42 mg/dL — ABNORMAL HIGH (ref 8–23)
CO2: 21 mmol/L — ABNORMAL LOW (ref 22–32)
Calcium: 8.4 mg/dL — ABNORMAL LOW (ref 8.9–10.3)
Chloride: 97 mmol/L — ABNORMAL LOW (ref 98–111)
Creatinine, Ser: 4.07 mg/dL — ABNORMAL HIGH (ref 0.44–1.00)
GFR, Estimated: 10 mL/min — ABNORMAL LOW (ref >60)
Glucose, Bld: 218 mg/dL — ABNORMAL HIGH (ref 70–99)
Phosphorus: 3.7 mg/dL (ref 2.5–4.6)
Potassium: 3.9 mmol/L (ref 3.5–5.1)
Sodium: 136 mmol/L (ref 135–145)

## 2022-12-14 LAB — GLUCOSE, CAPILLARY
Glucose-Capillary: 168 mg/dL — ABNORMAL HIGH (ref 70–99)
Glucose-Capillary: 207 mg/dL — ABNORMAL HIGH (ref 70–99)
Glucose-Capillary: 213 mg/dL — ABNORMAL HIGH (ref 70–99)
Glucose-Capillary: 217 mg/dL — ABNORMAL HIGH (ref 70–99)
Glucose-Capillary: 224 mg/dL — ABNORMAL HIGH (ref 70–99)
Glucose-Capillary: 231 mg/dL — ABNORMAL HIGH (ref 70–99)
Glucose-Capillary: 253 mg/dL — ABNORMAL HIGH (ref 70–99)

## 2022-12-14 LAB — CYTOLOGY - NON PAP

## 2022-12-14 NOTE — Inpatient Diabetes Management (Signed)
Inpatient Diabetes Program Recommendations  AACE/ADA: New Consensus Statement on Inpatient Glycemic Control   Target Ranges:  Prepandial:   less than 140 mg/dL      Peak postprandial:   less than 180 mg/dL (1-2 hours)      Critically ill patients:  140 - 180 mg/dL    Latest Reference Range & Units 12/13/22 08:08 12/13/22 11:54 12/13/22 16:10 12/13/22 20:55 12/13/22 23:58 12/14/22 04:11 12/14/22 07:31  Glucose-Capillary 70 - 99 mg/dL 604 (H) 540 (H) 981 (H) 176 (H) 224 (H) 207 (H) 231 (H)   Review of Glycemic Control  Diabetes history: DM2 Outpatient Diabetes medications: OmniPod insulin pump with Humalog, Libre CGM, Ozempic 1 mg Qweek Current orders for Inpatient glycemic control: Semglee 5 units daily, Novolog 0-6 units TID with meals  Inpatient Diabetes Program Recommendations:    Insulin: CBG 231 mg/dl today. May want to consider increasing Semglee to 7 units daily.  Thanks, [name], RN, MSN, CDCES Diabetes Coordinator Inpatient Diabetes Program [phone] (Team Pager from 8am to 5pm)

## 2022-12-14 NOTE — Progress Notes (Signed)
RN entered room to find that confused patient had pulled out HD cath. Blood on bed and gown. Site was no longer bleeding and dressing applied. On-call provider paged. Received order for STAT CXR. Contacted patient's daughter and informed. Patient's son to come to bedside.

## 2022-12-14 NOTE — Progress Notes (Signed)
Alert this morning and able to take pills and feed self a little oatmeal.  Daughter came and continued helping with eating.  Dressing changed to left leg abrasion .  Stated wanted to sit in chair and son put her in chair. Dressing to right chest dialysis site dry and intact.

## 2022-12-14 NOTE — Procedures (Addendum)
   NEPHROLOGY NURSING NOTE:  Events of last night noted.  Pt in bed, eyes closed.  Drowsy, responds to her name but not answering questions. Daughter Lamar Laundry) is present, says pt ate half her breakfast this morning and was "in good spirits" earlier.    Lamar Laundry also relays that her mother said she "didn't mean to" remove TDC last night--that she was uncovering herself while experiencing hot flashes.  We talked about pt's restlessness during her last HD session and the feasibility of safe outpatient HD when pt is requiring 1:1 care.  Lamar Laundry reports her mother asked, "I'm having dialysis today, right?"  Lamar Laundry also considers the possibility that her mother may not wish to continue dialysis, and asked appropriate questions re hospice care, life expectancy.  Dr. Valentino Nose was present for this discussion.    Lamar Laundry has asked her siblings to come to the hospital for further discussion of goals of care, and states she would like a "witness present" for outcome / decision. ---------------------------------------------------------------------------------------  Addendum @ 1550:  I received a phone call from [facility] asking me to come to pt's room as her siblings had some questions re dialysis.  On my arrival, pt's two daughters, one son and Helmut Muster, [place] NP were present.  Pt was in her recliner with eyes closed.  Son stated he "picked her up" to put her in the recliner.  Pt with minimal response to voice, eyes remained closed, would not answer questions. Questions were answered re UF v HD, anemia of ESKD, symptoms of fluid overload, and intra-HD hemodynamics.    Arman Filter, RN AP K[facility]

## 2022-12-14 NOTE — Progress Notes (Signed)
PROGRESS NOTE    [name] [name]  WJX:[id number] DOB: [birth year] DOA: 12/04/2022 PCP: Dorothyann Peng, MD   Brief Narrative:    [name] [name] is a 82 y.o. female with medical history significant for HFpEF, type 2 diabetes insulin-dependent, CKD stage IV, hypertension, dyslipidemia, chronic anemia of CKD, and prior CVA who presented to the ED with worsening shortness of breath as well as dry cough that began last night.  Patient was admitted with acute hypoxemic respiratory failure-multifactorial in the setting of volume overload as well as suspected pneumonia.  She is also noted to have worsening AKI on CKD stage IV and nephrology recommending hemodialysis, family now appears agreeable.  She is noted to have pleural effusion and will undergo thoracentesis today.  Anticipate discharge to skilled nursing facility pending bed availability in the next 24-48 hours.  Assessment & Plan:   Principal Problem:   Acute hypoxemic respiratory failure (HCC) Active Problems:   Chronic kidney disease (CKD), stage IV (severe) (HCC)   Essential hypertension   Mixed hyperlipidemia   Anemia due to chronic kidney disease   Diabetic retinopathy associated with type 2 diabetes mellitus (HCC)   Wound of left leg   End stage renal disease (HCC)   Protein-calorie malnutrition, severe  Assessment and Plan:  Acute hypoxemic respiratory failure-likely related to possible pneumonia -Off BiPAP; status post thoracentesis. -Left lower lobe atelectasis versus effusion with possible pneumonia. -no signs of acute infection currently. -Scheduled breathing treatments, flutter valve and incentive spirometry has been encouraged. -Continue to wean off oxygen supplementation as needed. Down to 1 L [place] -Antibiotics therapy has been completed.   AKI on CKD stage IV to V with metabolic acidosis Poor oral intake over the last 1 week noted Family now appears agreeable for hemodialysis and this will be initiated per  nephrology when appropriate -Status post dialysis catheter placement; and back-to-back initiation treatments (8/29>>8/30>>8/31); social worker aware to assist with outpatient dialysis chair.   -Patient so far has tolerated dialysis treatment; following available spot in the outpatient setting (At [facility] dialysis Tuesday-Thursday and Saturday); status post hemodialysis on 12/12/2022. -At this moment dialysis on hold after losing dialysis catheter; planning for temporary placement on 12/15/2022 by general surgery and subsequent tunneled catheter down the road.  Most likely dialysis tomorrow. -Continue to closely follow daily weights, volume status and response to HD. -Continue to follow nephrology service recommendation; appreciate their assistance in care.   Left lower extremity cellulitis/wound -Continue to follow wound care as recommended by wound care service.   -Continue current antibiotics with Rocephin and doxycycline. -DVT evaluation negative.   Elevated BNP -Does not appear to be related to acute CHF -Likely elevated in the setting of AKI on CKD stage IV-V patient a transition point currently based on GFR. -Continue to follow daily weights and strict I's and O's. -At the moment we will continue volume management with dialysis treatments. -Continue low-sodium diet.   Anemia of CKD -Receives Retacrit injections outpatient with last dose 8/5 -No overt bleeding appreciated. -Continue to monitor with repeat CBC, no overt bleeding noted -Transfuse for hemoglobin less than 7 -Dose of Aranesp 60 mcg per nephrology -Further treatment with Epogen and IV iron as per nephrology discretion. -Continue to follow hemoglobin trend. -No overt bleeding reported or appreciated.   DM2 with mild hyperglycemia/hypoglycemia -Continue on carb modified diet -Continue to follow CBGs fluctuation and adjust hypoglycemic regimen as needed. -Now the patient has been started on dialysis mild hypoglycemic events  appreciated; insulin pump has been  discontinue and the sliding scale insulin along with low-dose Semglee has been started.   Hypertension -Continue home antihypertensive agents -Continue as needed hydralazine -Follow vital signs -Anticipate improvement with dialysis -Continue to follow vital signs.   Dyslipidemia -Continue atorvastatin -Heart healthy diet discussed with patient.   PAF -Continue Eliquis for secondary prevention. -Continue metoprolol -Normal sinus rhythm per telemetry evaluation. -Continue to follow electrolytes.   HFpEF Continue metoprolol Repeat 2D echocardiogram with previous 03/2021 with LVEF 65-70% with grade 1 diastolic dysfunction and moderate LVH Noted to have elevated BNP, but more likely related to significant renal dysfunction. -At this moment after discussing with nephrologist and following further deterioration in her renal function plan is to initiate dialysis -Dialysis catheter has been placed and ready to use (appreciate assistance and care by Dr. Henreitta Leber). -Continue volume management with dialysis treatment.  Hypokalemia -Continue to have electrolytes repleted throughout hemodialysis -Potassium will continue to be stabilized with hemodialysis treatment. -No abnormalities appreciated on telemetry.  Presumed dysphagia -Speech therapy has been consulted and will follow recommendations.   DVT prophylaxis: Continue Eliquis. Code Status: Full Family Communication: daughter at bedside. Disposition Plan:  Status is: Inpatient  Remains inpatient appropriate because: Need for IV antibiotics and inpatient procedures.   Consultants:  Nephrology Palliative care  Procedures:  Ultrasound thoracentesis pending 8/27  Antimicrobials:  Anti-infectives (From admission, onward)    Start     Dose/Rate Route Frequency Ordered Stop   12/13/22 1600  cefdinir (OMNICEF) capsule 300 mg        300 mg Oral Every 24 hours 12/13/22 1445 12/13/22 1717    12/12/22 2200  doxycycline (VIBRA-TABS) tablet 100 mg        100 mg Oral Every 12 hours 12/12/22 0930 12/13/22 2204   12/12/22 1500  cefTRIAXone (ROCEPHIN) 1 g in sodium chloride 0.9 % 100 mL IVPB  Status:  Discontinued        1 g 200 mL/hr over 30 Minutes Intravenous Every 24 hours 12/12/22 0930 12/13/22 1445   12/07/22 1045  ceFAZolin (ANCEF) IVPB 2g/100 mL premix  Status:  Discontinued        2 g 200 mL/hr over 30 Minutes Intravenous On call to O.R. 12/07/22 7829 12/07/22 1333   12/05/22 2200  doxycycline (VIBRAMYCIN) 100 mg in sodium chloride 0.9 % 250 mL IVPB  Status:  Discontinued        100 mg 125 mL/hr over 120 Minutes Intravenous Every 12 hours 12/04/22 1526 12/12/22 0930   12/05/22 1500  cefTRIAXone (ROCEPHIN) 1 g in sodium chloride 0.9 % 100 mL IVPB  Status:  Discontinued        1 g 200 mL/hr over 30 Minutes Intravenous Every 24 hours 12/04/22 1526 12/12/22 0930   12/04/22 1345  cefTRIAXone (ROCEPHIN) 1 g in sodium chloride 0.9 % 100 mL IVPB        1 g 200 mL/hr over 30 Minutes Intravenous  Once 12/04/22 1335 12/04/22 1537   12/04/22 1345  doxycycline (VIBRA-TABS) tablet 100 mg        100 mg Oral  Once 12/04/22 1335 12/04/22 1507      Subjective: Chronically ill in appearance; 1 L nasal cannula in place mainly for comfort; overnight lost hemodialysis catheter after pulling it on accident.  Patient denies chest pain, nausea, vomiting, abdominal pain or any other complaints.  Objective: Vitals:   12/14/22 0407 12/14/22 0428 12/14/22 0815 12/14/22 1549  BP: (!) 175/77 (!) 172/83  114/77  Pulse: 76 85  80  Resp: 20   16  Temp: 98 F (36.7 C) 98.7 F (37.1 C)  98.5 F (36.9 C)  TempSrc:  Axillary  Oral  SpO2: 92% 92% 90% 95%  Weight:      Height:        Intake/Output Summary (Last 24 hours) at 12/14/2022 1716 Last data filed at 12/14/2022 1300 Gross per 24 hour  Intake 600 ml  Output --  Net 600 ml   Filed Weights   12/12/22 0500 12/12/22 1120 12/13/22 0500   Weight: 71.8 kg 71.8 kg 69.5 kg    Examination: General exam: Sleepy, but easily aroused and following commands appropriately.  No chest pain, no nausea or vomiting currently.  Overnight she has ended up pulling dialysis catheter by accident. Respiratory system: Positive scattered rhonchi; no using accessory muscles, no frank crackles. Cardiovascular system:RRR. No rubs or gallops; no JVD. Gastrointestinal system: Abdomen is nondistended, soft and nontender. No organomegaly or masses felt. Normal bowel sounds heard. Central nervous system: Moving 4 limbs are spontaneously.  No focal neurological deficits. Extremities: No cyanosis, clubbing or edema. Skin: No petechiae, no rash, left lower extremity anterior shin wound healing adequately and without signs of superimposed cellulitic process or active drainage. Psychiatry: Stable mood.  Data Reviewed: I have personally reviewed following labs and imaging studies  CBC: Recent Labs  Lab 12/07/22 2000 12/08/22 0447 12/09/22 0410 12/12/22 1405 12/13/22 0500  WBC 11.4* 14.7* 18.0* 13.8* 11.4*  HGB 8.1* 8.8* 9.0* 8.0* 7.7*  HCT 24.4* 26.6* 27.4* 24.5* 23.7*  MCV 88.7 87.8 89.0 87.8 89.8  PLT 436* 421* 446* 474* 406*   Basic Metabolic Panel: Recent Labs  Lab 12/10/22 0429 12/11/22 0420 12/12/22 0609 12/13/22 0500 12/14/22 0453  NA 136 136 135 135 136  K 3.7 3.7 4.1 4.1 3.9  CL 98 99 97* 95* 97*  CO2 27 24 21* 21* 21*  GLUCOSE 138* 177* 208* 233* 218*  BUN 30* 42* 52* 29* 42*  CREATININE 3.62* 4.43* 5.05* 3.23* 4.07*  CALCIUM 8.1* 8.5* 8.5* 8.3* 8.4*  PHOS 3.9 5.4* 4.9* 3.5 3.7   GFR: Estimated Creatinine Clearance: 9.4 mL/min (A) (by C-G formula based on SCr of 4.07 mg/dL (H)).  CBG: Recent Labs  Lab 12/13/22 2055 12/13/22 2358 12/14/22 0411 12/14/22 0731 12/14/22 1120  GLUCAP 176* 224* 207* 231* 253*    Recent Results (from the past 240 hour(s))  Gram stain     Status: None   Collection Time: 12/06/22  9:40 AM    Specimen: Pleura  Result Value Ref Range Status   Specimen Description PLEURAL  Final   Special Requests NONE  Final   Gram Stain   Final    CYTOSPIN SMEAR NO ORGANISMS SEEN WBC PRESENT,BOTH PMN AND MONONUCLEAR Performed at [facility], [address], [place], [place] [address]    Report Status 12/06/2022 FINAL  Final  Culture, body fluid w Gram Stain-bottle     Status: None   Collection Time: 12/06/22  9:40 AM   Specimen: Pleura  Result Value Ref Range Status   Specimen Description PLEURAL  Final   Special Requests 10CC BOTTLES DRAWN AEROBIC AND ANAEROBIC  Final   Culture   Final    NO GROWTH 5 DAYS Performed at [facility], [address] [place], [place] [address]    Report Status 12/11/2022 FINAL  Final  Surgical PCR screen     Status: None   Collection Time: 12/07/22  3:07 AM   Specimen: Nasal Mucosa; Nasal Swab  Result Value Ref Range Status   MRSA, PCR NEGATIVE NEGATIVE Final   Staphylococcus aureus NEGATIVE NEGATIVE Final    Comment: (NOTE) The Xpert SA Assay (FDA approved for NASAL specimens in patients 70 years of age and older), is one component of a comprehensive surveillance program. It is not intended to diagnose infection nor to guide or monitor treatment. Performed at [facility], [address] [place], [place] [address]     Radiology Studies: DG Chest 1 View  Result Date: 12/14/2022 CLINICAL DATA:  [id number]. Extravasation injury of IV catheter site with other complication. The patient pulled out his right IJ hemodialysis catheter. Admitted for hypoxic respiratory failure. EXAM: CHEST  1 VIEW COMPARISON:  Portable chest 12/11/2022 FINDINGS: 12:53 a.m. interval removal of previous right IJ dialysis catheter. No pneumothorax is seen. Bilateral moderate-sized pleural effusions continue to be noted with overlying consolidation or atelectasis of the lower lung fields. The heart is enlarged. Perihilar vascular congestion and mild central interstitial edema appear  similar. The upper lung fields remain clear. The mediastinum is stable with aortic atherosclerosis. Overall aeration is unchanged with no new abnormality. No new osseous findings. IMPRESSION: 1. Interval removal of previous right IJ dialysis catheter. No pneumothorax. 2. Stable cardiomegaly, perihilar vascular congestion and mild central interstitial edema. 3. Stable moderate-sized bilateral pleural effusions with overlying consolidation or atelectasis in the lower lung fields. Electronically Signed   By: Almira Bar M.D.   On: 12/14/2022 01:29    Scheduled Meds:  amLODipine  5 mg Oral Daily   atorvastatin  20 mg Oral Daily   budesonide (PULMICORT) nebulizer solution  0.25 mg Nebulization BID   Chlorhexidine Gluconate Cloth  6 each Topical Q0600   darbepoetin (ARANESP) injection - DIALYSIS  60 mcg Subcutaneous Q Tue-1800   famotidine  20 mg Oral QHS   feeding supplement (NEPRO CARB STEADY)  237 mL Oral TID BM   fluticasone  1 spray Each Nare Daily   hydrALAZINE  25 mg Oral TID   influenza vaccine adjuvanted  0.5 mL Intramuscular Tomorrow-1000   insulin aspart  0-6 Units Subcutaneous TID WC   insulin glargine-yfgn  5 Units Subcutaneous Daily   isosorbide mononitrate  60 mg Oral Daily   latanoprost  1 drop Both Eyes QHS   loratadine  10 mg Oral Daily   metoprolol succinate  12.5 mg Oral Daily   multivitamin  1 tablet Oral QHS   Continuous Infusions:  iron sucrose       LOS: 10 days   Time spent: 45 minutes  Trevontae Lindahl,MD [facility]  If 7PM-7AM, please contact night-coverage [email or web address] 12/14/2022, 5:16 PM

## 2022-12-14 NOTE — Progress Notes (Signed)
Patient ID: [name] [name], female   DOB: 09-25-1940, 82 y.o.   MRN: [id number] S: patient somnolent and not interactive this morning.  Sitting up in bed with head slumped forward.  Daughter at bedside.  Had elongated conversation after patient removed her tunneled dialysis catheter overnight.  It seems she was trying to scratch herself and pulled it out on accident.  We discussed the patient's overall goals of care.  Given her current mental state she may not be a good long-term dialysis candidate unless she improves.  The daughter is waiting for the patient wake up so she can be more interactive with conversation.  She will let us know what she decides about a catheter O:BP (!) 172/83 (BP Location: Right Arm)   Pulse 85   Temp 98.7 F (37.1 C) (Axillary)   Resp 20   Ht 5\' 2"  (1.575 m)   Wt 69.5 kg   SpO2 90%   BMI 28.02 kg/m   Intake/Output Summary (Last 24 hours) at 12/14/2022 1215 Last data filed at 12/14/2022 0900 Gross per 24 hour  Intake 720 ml  Output --  Net 720 ml   Intake/Output: I/O last 3 completed shifts: In: 1060 [P.O.:960; IV Piggyback:100] Out: 300 [Urine:300]  Intake/Output this shift:  Total I/O In: 240 [P.O.:240] Out: -  Weight change:  Gen: NAD, sitting in bed CVS: normal rate Resp:bilateral chest rise, mild increased work of breathing Abd: nondistended,  nontender Ext: no edema, warm and well perfused  Recent Labs  Lab 12/08/22 0447 12/09/22 0410 12/10/22 0429 12/11/22 0420 12/12/22 0609 12/13/22 0500 12/14/22 0453  NA 137 136 136 136 135 135 136  K 2.6* 3.1* 3.7 3.7 4.1 4.1 3.9  CL 103 99 98 99 97* 95* 97*  CO2 26 26 27 24  21* 21* 21*  GLUCOSE 83 113* 138* 177* 208* 233* 218*  BUN 59* 38* 30* 42* 52* 29* 42*  CREATININE 6.38* 4.62* 3.62* 4.43* 5.05* 3.23* 4.07*  ALBUMIN 2.3* 2.2* 2.1* 2.0* 2.4* 2.4* 2.5*  CALCIUM 7.7* 7.8* 8.1* 8.5* 8.5* 8.3* 8.4*  PHOS 5.1* 4.0 3.9 5.4* 4.9* 3.5 3.7   Liver Function Tests: Recent Labs  Lab 12/12/22 0609  12/13/22 0500 12/14/22 0453  ALBUMIN 2.4* 2.4* 2.5*   No results for input(s): "LIPASE", "AMYLASE" in the last 168 hours. No results for input(s): "AMMONIA" in the last 168 hours. CBC: Recent Labs  Lab 12/07/22 2000 12/08/22 0447 12/09/22 0410 12/12/22 1405 12/13/22 0500  WBC 11.4* 14.7* 18.0* 13.8* 11.4*  HGB 8.1* 8.8* 9.0* 8.0* 7.7*  HCT 24.4* 26.6* 27.4* 24.5* 23.7*  MCV 88.7 87.8 89.0 87.8 89.8  PLT 436* 421* 446* 474* 406*   Cardiac Enzymes: No results for input(s): "CKTOTAL", "CKMB", "CKMBINDEX", "TROPONINI" in the last 168 hours. CBG: Recent Labs  Lab 12/13/22 2055 12/13/22 2358 12/14/22 0411 12/14/22 0731 12/14/22 1120  GLUCAP 176* 224* 207* 231* 253*    Iron Studies:  Recent Labs    12/13/22 0500  IRON 26*  TIBC 159*   Studies/Results: DG Chest 1 View  Result Date: 12/14/2022 CLINICAL DATA:  [id number]. Extravasation injury of IV catheter site with other complication. The patient pulled out his right IJ hemodialysis catheter. Admitted for hypoxic respiratory failure. EXAM: CHEST  1 VIEW COMPARISON:  Portable chest 12/11/2022 FINDINGS: 12:53 a.m. interval removal of previous right IJ dialysis catheter. No pneumothorax is seen. Bilateral moderate-sized pleural effusions continue to be noted with overlying consolidation or atelectasis of the lower lung fields. The heart is  enlarged. Perihilar vascular congestion and mild central interstitial edema appear similar. The upper lung fields remain clear. The mediastinum is stable with aortic atherosclerosis. Overall aeration is unchanged with no new abnormality. No new osseous findings. IMPRESSION: 1. Interval removal of previous right IJ dialysis catheter. No pneumothorax. 2. Stable cardiomegaly, perihilar vascular congestion and mild central interstitial edema. 3. Stable moderate-sized bilateral pleural effusions with overlying consolidation or atelectasis in the lower lung fields. Electronically Signed   By: Almira Bar  M.D.   On: 12/14/2022 01:29    amLODipine  5 mg Oral Daily   apixaban  2.5 mg Oral BID   atorvastatin  20 mg Oral Daily   budesonide (PULMICORT) nebulizer solution  0.25 mg Nebulization BID   Chlorhexidine Gluconate Cloth  6 each Topical Q0600   darbepoetin (ARANESP) injection - DIALYSIS  60 mcg Subcutaneous Q Tue-1800   famotidine  20 mg Oral QHS   feeding supplement (NEPRO CARB STEADY)  237 mL Oral TID BM   fluticasone  1 spray Each Nare Daily   hydrALAZINE  25 mg Oral TID   influenza vaccine adjuvanted  0.5 mL Intramuscular Tomorrow-1000   insulin aspart  0-6 Units Subcutaneous TID WC   insulin glargine-yfgn  5 Units Subcutaneous Daily   isosorbide mononitrate  60 mg Oral Daily   latanoprost  1 drop Both Eyes QHS   loratadine  10 mg Oral Daily   metoprolol succinate  12.5 mg Oral Daily   multivitamin  1 tablet Oral QHS    BMET    Component Value Date/Time   NA 136 12/14/2022 0453   NA 142 06/26/2022 0000   K 3.9 12/14/2022 0453   CL 97 (L) 12/14/2022 0453   CO2 21 (L) 12/14/2022 0453   GLUCOSE 218 (H) 12/14/2022 0453   BUN 42 (H) 12/14/2022 0453   BUN 51 (A) 06/26/2022 0000   CREATININE 4.07 (H) 12/14/2022 0453   CREATININE 1.95 (H) 10/09/2018 0905   CALCIUM 8.4 (L) 12/14/2022 0453   GFRNONAA 10 (L) 12/14/2022 0453   GFRNONAA 24 (L) 10/09/2018 0905   GFRAA 20 (L) 03/15/2020 1149   GFRAA 27 (L) 10/09/2018 0905   CBC    Component Value Date/Time   WBC 11.4 (H) 12/13/2022 0500   RBC 2.64 (L) 12/13/2022 0500   HGB 7.7 (L) 12/13/2022 0500   HGB 9.1 (L) 05/30/2022 1001   HCT 23.7 (L) 12/13/2022 0500   HCT 28.3 (L) 05/30/2022 1001   PLT 406 (H) 12/13/2022 0500   PLT 357 05/30/2022 1001   MCV 89.8 12/13/2022 0500   MCV 91 05/30/2022 1001   MCH 29.2 12/13/2022 0500   MCHC 32.5 12/13/2022 0500   RDW 15.9 (H) 12/13/2022 0500   RDW 12.7 05/30/2022 1001   LYMPHSABS 1.5 10/16/2022 1438   MONOABS 0.8 10/16/2022 1438   EOSABS 0.2 10/16/2022 1438   BASOSABS 0.1  10/16/2022 1438    Assessment/Plan:   Acute hypoxic respiratory failure - possible pneumonia vs volume overload.  Status post thoracentesis.  Some improvement with dialysis.  Continue with ultrafiltration as tolerated CKD stage V now ESRD- pt has been followed in our office for quite some time and she has always stated that she would rather die than go on dialysis, however it appears that her family has changed her mind.  We discussed the need for [facility] and eventually an AVF/AVG.  We also discussed home dialysis with PD and HHD.  Family is interested.  She had TDC placed 12/07/22 by  Dr. Henreitta Leber and had her first HD session on 8/29 tshe was scheduled for dialysis TTS outpatient.  Now hold her dialysis catheter out overnight.  Family will continue goals of care conversations will follow their recommendations.  May be beneficial to involve palliative care again.  I think she would likely qualify for inpatient or home hospice Avoid nephrotoxic medications including NSAIDs and iodinated intravenous contrast exposure unless the latter is absolutely indicated. Preferred narcotic agents for pain control are hydromorphone, fentanyl, and methadone. Morphine should not be used. Avoid Baclofen and avoid oral sodium phosphate and magnesium citrate based laxatives / bowel preps. Continue strict Input and Output monitoring.  Will monitor the patient closely with you and intervene or adjust therapy as indicated by changes in clinical status/labs  Left leg cellulitis - with wound. Antibiotics per primary team Anemia of ESRD: on ESA and has received IV iron DM type 2 - per primary Hypokalemia - replace when necessary HTN/Volume - UF as tolerated Debilitation - will likely require SNF for short term rehab given her poor functional status; consider hospice Disposition - pt's family would like to go to [facility] as it is closer to her home.  SW/CM has helped arranged care time but now patient pulled catheter out so  have to readdress goals of care.  Ongoing discussion.  May benefit from palliative care.  Consider looking into home hospice versus inpatient hospice based on the conversation  Darnell Level  [facility]

## 2022-12-14 NOTE — Progress Notes (Signed)
Palliative:  HPI: 82 y.o. female  with past medical history of HFpEF, DM2 with retinopathy, CKD 4, HTN/HLD, anemia of chronic disease, prior CVA, admitted on 11/11/2022 with acute hypoxic respiratory failure likely related to possible pneumonia with left lower lobe atelectasis versus effusion.    I met today at [name] [name]'s bedside along with her 3 adult children. [name] [name] sleeps throughout my visit. We were joined by Arman Filter, HD RN. Family and I discuss goals of care and they tell me that [name] [name] has told them today twice during good moments of clarity that she wants to continue dialysis. We spoke at length along with Marylene Land about the barriers and risks of continuing dialysis. Family have many good questions about dialysis specifics and how this impacts the body. I discussed concern for overall poor prognosis with dialysis. Family are clear in their goals to continue dialysis with hopes of some level of stabilization and more time with their mother. [name] [name] was unable to participate in conversation today. Hard Choices provided.   All questions/concerns addressed. Emotional support provided.   Exam: Thin, frail. Sitting up in recliner. Lethargic. No distress. Confused when awake. Follows some simple commands. Breathing regular, unlabored. Abd soft.   Plan: - Full code, full scope - Goal is to continue dialysis  50 min  Yong Channel, NP Palliative Medicine Team Pager [phone] (Please see [email or web address] for schedule) Team Phone [phone]    Greater than 50%  of this time was spent counseling and coordinating care related to the above assessment and plan

## 2022-12-15 ENCOUNTER — Ambulatory Visit (HOSPITAL_COMMUNITY)
Admission: RE | Admit: 2022-12-15 | Discharge: 2022-12-15 | Disposition: A | Discharge status: Home / Self Care | Payer: Medicare Other | Source: Ambulatory Visit | Attending: Student | Admitting: Student

## 2022-12-15 DIAGNOSIS — Z7985 Long-term (current) use of injectable non-insulin antidiabetic drugs: Secondary | ICD-10-CM | POA: Insufficient documentation

## 2022-12-15 DIAGNOSIS — I132 Hypertensive heart and chronic kidney disease with heart failure and with stage 5 chronic kidney disease, or end stage renal disease: Secondary | ICD-10-CM | POA: Insufficient documentation

## 2022-12-15 DIAGNOSIS — N186 End stage renal disease: Secondary | ICD-10-CM | POA: Insufficient documentation

## 2022-12-15 DIAGNOSIS — Z8673 Personal history of transient ischemic attack (TIA), and cerebral infarction without residual deficits: Secondary | ICD-10-CM | POA: Insufficient documentation

## 2022-12-15 DIAGNOSIS — J9601 Acute respiratory failure with hypoxia: Secondary | ICD-10-CM | POA: Diagnosis not present

## 2022-12-15 DIAGNOSIS — J189 Pneumonia, unspecified organism: Secondary | ICD-10-CM | POA: Diagnosis not present

## 2022-12-15 DIAGNOSIS — E1122 Type 2 diabetes mellitus with diabetic chronic kidney disease: Secondary | ICD-10-CM | POA: Insufficient documentation

## 2022-12-15 DIAGNOSIS — Z794 Long term (current) use of insulin: Secondary | ICD-10-CM | POA: Insufficient documentation

## 2022-12-15 DIAGNOSIS — Z992 Dependence on renal dialysis: Secondary | ICD-10-CM | POA: Insufficient documentation

## 2022-12-15 DIAGNOSIS — E782 Mixed hyperlipidemia: Secondary | ICD-10-CM | POA: Diagnosis not present

## 2022-12-15 DIAGNOSIS — S81802D Unspecified open wound, left lower leg, subsequent encounter: Secondary | ICD-10-CM | POA: Diagnosis not present

## 2022-12-15 DIAGNOSIS — I5032 Chronic diastolic (congestive) heart failure: Secondary | ICD-10-CM | POA: Insufficient documentation

## 2022-12-15 HISTORY — PX: IR FLUORO GUIDE CV LINE RIGHT: IMG2283

## 2022-12-15 HISTORY — PX: IR US GUIDE VASC ACCESS RIGHT: IMG2390

## 2022-12-15 LAB — ABO/RH: ABO/RH(D): B POS

## 2022-12-15 LAB — RENAL FUNCTION PANEL
Albumin: 2.4 g/dL — ABNORMAL LOW (ref 3.5–5.0)
Anion gap: 16 — ABNORMAL HIGH (ref 5–15)
BUN: 48 mg/dL — ABNORMAL HIGH (ref 8–23)
CO2: 24 mmol/L (ref 22–32)
Calcium: 8.5 mg/dL — ABNORMAL LOW (ref 8.9–10.3)
Chloride: 96 mmol/L — ABNORMAL LOW (ref 98–111)
Creatinine, Ser: 4.9 mg/dL — ABNORMAL HIGH (ref 0.44–1.00)
GFR, Estimated: 8 mL/min — ABNORMAL LOW (ref >60)
Glucose, Bld: 226 mg/dL — ABNORMAL HIGH (ref 70–99)
Phosphorus: 3.6 mg/dL (ref 2.5–4.6)
Potassium: 3.2 mmol/L — ABNORMAL LOW (ref 3.5–5.1)
Sodium: 136 mmol/L (ref 135–145)

## 2022-12-15 LAB — GLUCOSE, CAPILLARY
Glucose-Capillary: 101 mg/dL — ABNORMAL HIGH (ref 70–99)
Glucose-Capillary: 146 mg/dL — ABNORMAL HIGH (ref 70–99)
Glucose-Capillary: 154 mg/dL — ABNORMAL HIGH (ref 70–99)
Glucose-Capillary: 180 mg/dL — ABNORMAL HIGH (ref 70–99)
Glucose-Capillary: 214 mg/dL — ABNORMAL HIGH (ref 70–99)
Glucose-Capillary: 221 mg/dL — ABNORMAL HIGH (ref 70–99)
Glucose-Capillary: 86 mg/dL (ref 70–99)

## 2022-12-15 LAB — CBC
HCT: 21.5 % — ABNORMAL LOW (ref 36.0–46.0)
Hemoglobin: 7 g/dL — ABNORMAL LOW (ref 12.0–15.0)
MCH: 29.3 pg (ref 26.0–34.0)
MCHC: 32.6 g/dL (ref 30.0–36.0)
MCV: 90 fL (ref 80.0–100.0)
Platelets: 384 10*3/uL (ref 150–400)
RBC: 2.39 MIL/uL — ABNORMAL LOW (ref 3.87–5.11)
RDW: 15.9 % — ABNORMAL HIGH (ref 11.5–15.5)
WBC: 14.6 10*3/uL — ABNORMAL HIGH (ref 4.0–10.5)
nRBC: 0 % (ref 0.0–0.2)

## 2022-12-15 LAB — PREPARE RBC (CROSSMATCH)

## 2022-12-15 MED ORDER — HEPARIN SODIUM (PORCINE) 1000 UNIT/ML IJ SOLN
INTRAMUSCULAR | Status: AC
  Filled 2022-12-15: qty 10

## 2022-12-15 MED ORDER — HEPARIN SODIUM (PORCINE) 1000 UNIT/ML IJ SOLN
INTRAMUSCULAR | Status: AC
  Filled 2022-12-15: qty 6

## 2022-12-15 MED ORDER — CEFAZOLIN SODIUM-DEXTROSE 2-4 GM/100ML-% IV SOLN
INTRAVENOUS | Status: DC | PRN
  Administered 2022-12-15: 2 g via INTRAVENOUS

## 2022-12-15 MED ORDER — DIPHENHYDRAMINE HCL 50 MG/ML IJ SOLN
INTRAMUSCULAR | Status: AC
  Filled 2022-12-15: qty 1

## 2022-12-15 MED ORDER — LIDOCAINE-EPINEPHRINE 1 %-1:100000 IJ SOLN
INTRAMUSCULAR | Status: AC
  Filled 2022-12-15: qty 1

## 2022-12-15 MED ORDER — CEFAZOLIN SODIUM-DEXTROSE 2-4 GM/100ML-% IV SOLN
INTRAVENOUS | Status: AC
  Filled 2022-12-15: qty 100

## 2022-12-15 MED ORDER — LIDOCAINE-EPINEPHRINE 1 %-1:100000 IJ SOLN
20.0000 mL | Freq: Once | INTRAMUSCULAR | Status: AC
  Administered 2022-12-15: 20 mL via INTRADERMAL

## 2022-12-15 MED ORDER — DIPHENHYDRAMINE HCL 50 MG/ML IJ SOLN
INTRAMUSCULAR | Status: DC | PRN
  Administered 2022-12-15: 25 mg via INTRAVENOUS

## 2022-12-15 MED ORDER — SODIUM CHLORIDE 0.9% IV SOLUTION: Freq: Once | INTRAVENOUS | Status: AC

## 2022-12-15 MED ORDER — MIDAZOLAM HCL 2 MG/2ML IJ SOLN
INTRAMUSCULAR | Status: AC
  Filled 2022-12-15: qty 2

## 2022-12-15 MED ORDER — ALBUMIN HUMAN 25 % IV SOLN
50.0000 g | Freq: Once | INTRAVENOUS | Status: AC
  Administered 2022-12-15: 50 g via INTRAVENOUS

## 2022-12-15 MED ORDER — ONDANSETRON HCL 4 MG/2ML IJ SOLN
INTRAMUSCULAR | Status: DC | PRN
  Administered 2022-12-15: 4 mg via INTRAVENOUS

## 2022-12-15 MED ORDER — FENTANYL CITRATE (PF) 100 MCG/2ML IJ SOLN
INTRAMUSCULAR | Status: AC
  Filled 2022-12-15: qty 2

## 2022-12-15 NOTE — Progress Notes (Signed)
Returned from [facility] having received dialysis port in right chest.  Dressing dry and intact.  Patient drowsy due to benadryl and zofran being given while gone.  Vitals stable.  Transported on bed to second floor dialysis just now.  Daughter at bedside.

## 2022-12-15 NOTE — Progress Notes (Signed)
-  Responded to rapid response in dialysis -Patient became hypotensive while being dialyzed -Had significant altered mentation/unresponsiveness -Despite IV fluids hypotension and altered mentation persist -Patient did not lose a pulse -She did not quit breathing -On a heart monitor she appears to be in sinus rhythm with heart rate in the 80s and 90s -She had placement of right-sided, 19 cm (tip-to-cuff), tunneled hemodialysis catheter with the tip of the catheter in the proximal right atrium earlier today 12/15/2022  Discussed with pt's daughters x 2 ([name] and Arlisa) at bedside -Patient remains a full code without limitations to treatment at this time - -We gave 500 mL of normal saline -Patient also received albumin x 4 amp (50 gm) -CBC noted with hemoglobin of 7.0, hemoglobin appears to have been drifting down over the last few days ---Hgb 9.0 >>7.7 >>7.0 -No obvious bleeding noted -- -Okay to transfuse 1 unit of PRBC - ESA/Procrit agent per nephrology team Ultrafiltration/hemodialysis with output and fluid today 12/15/2022 as long as hemodynamics allow - Total critical care time 34 minutes  Shon Hale, MD

## 2022-12-15 NOTE — Progress Notes (Signed)
Physical Therapy Treatment Patient Details Name: [name] [name] MRN: [id number] DOB: [birth year] Today's Date: 12/15/2022   History of Present Illness per MD note today:Ursala W Bagnall is a 82 y.o. female with medical history significant for HFpEF, type 2 diabetes insulin-dependent, CKD stage IV, hypertension, dyslipidemia, chronic anemia of CKD, and prior CVA who presented to the ED with worsening shortness of breath as well as dry cough that began last night.  Patient was admitted with acute hypoxemic respiratory failure-multifactorial in the setting of volume overload as well as suspected pneumonia.  She is also noted to have worsening AKI on CKD stage IV and nephrology recommending hemodialysis, family now appears agreeable.  She is noted to have pleural effusion and will undergo thoracentesis today.  Anticipate hemodialysis initiation in the next 1-2 days.    PT Comments  Patient demonstrates slow labored movement for sitting up at bedside requiring repeated verbal/tactile cueing for proper use of BUE with fair/poor carryover, required active assistance for completing exercises, limited to a few slow labored side steps before having to sit due to knees buckling and tolerated sitting up in chair with her daughter present after therapy.  Patient will benefit from continued skilled physical therapy in hospital and recommended venue below to increase strength, balance, endurance for safe ADLs and gait.      If plan is discharge home, recommend the following: A lot of help with bathing/dressing/bathroom;A lot of help with walking and/or transfers;Help with stairs or ramp for entrance;Assistance with cooking/housework   Can travel by private vehicle     No  Equipment Recommendations  None recommended by PT    Recommendations for Other Services       Precautions / Restrictions Precautions Precautions: Fall Restrictions Weight Bearing Restrictions: No     Mobility  Bed Mobility Overal  bed mobility: Needs Assistance Bed Mobility: Supine to Sit     Supine to sit: Mod assist     General bed mobility comments: increased time, labored movement    Transfers Overall transfer level: Needs assistance Equipment used: Rolling walker (2 wheels) Transfers: Sit to/from Stand, Bed to chair/wheelchair/BSC Sit to Stand: Min assist, Mod assist   Step pivot transfers: Mod assist       General transfer comment: unsteady labored movement    Ambulation/Gait Ambulation/Gait assistance: Mod assist Gait Distance (Feet): 5 Feet Assistive device: Rolling walker (2 wheels) Gait Pattern/deviations: Decreased step length - right, Decreased step length - left, Decreased stride length, Trunk flexed, Knees buckling Gait velocity: slow     General Gait Details: limited to a few side steps at bedside before having to sit due buckling of knees, BLE weakness   Stairs             Wheelchair Mobility     Tilt Bed    Modified Rankin (Stroke Patients Only)       Balance Overall balance assessment: Needs assistance Sitting-balance support: Feet supported, No upper extremity supported Sitting balance-Leahy Scale: Fair Sitting balance - Comments: seated at EOB   Standing balance support: During functional activity, Reliant on assistive device for balance, Bilateral upper extremity supported Standing balance-Leahy Scale: Poor Standing balance comment: fair/poor using RW                            Cognition Arousal: Alert Behavior During Therapy: Impulsive, Restless Overall Cognitive Status: Within Functional Limits for tasks assessed  General Comments: appears confused requiring frequent redirection to tasks        Exercises General Exercises - Lower Extremity Ankle Circles/Pumps: Seated, AROM, Strengthening, 10 reps, AAROM Long Arc Quad: Seated, AROM, Strengthening, Both, 10 reps, AAROM Hip Flexion/Marching:  Seated, AROM, Strengthening, Both, 10 reps, AAROM    General Comments        Pertinent Vitals/Pain Pain Assessment Pain Assessment: Faces Faces Pain Scale: Hurts a little bit Pain Location: discomfort all over Pain Descriptors / Indicators: Discomfort, Grimacing, Sore Pain Intervention(s): Limited activity within patient's tolerance, Monitored during session, Repositioned    Home Living                          Prior Function            PT Goals (current goals can now be found in the care plan section) Acute Rehab PT Goals Patient Stated Goal: to be able to walk again PT Goal Formulation: With patient/family Potential to Achieve Goals: Good Progress towards PT goals: Progressing toward goals    Frequency    Min 3X/week      PT Plan      Co-evaluation              AM-PAC PT "6 Clicks" Mobility   Outcome Measure  Help needed turning from your back to your side while in a flat bed without using bedrails?: A Little Help needed moving from lying on your back to sitting on the side of a flat bed without using bedrails?: A Lot Help needed moving to and from a bed to a chair (including a wheelchair)?: A Lot Help needed standing up from a chair using your arms (e.g., wheelchair or bedside chair)?: A Lot Help needed to walk in hospital room?: A Lot Help needed climbing 3-5 steps with a railing? : Total 6 Click Score: 12    End of Session Equipment Utilized During Treatment: Oxygen Activity Tolerance: Patient tolerated treatment well;Patient limited by fatigue Patient left: in chair;with call bell/phone within reach;with family/visitor present Nurse Communication: Mobility status PT Visit Diagnosis: Unsteadiness on feet (R26.81);Other abnormalities of gait and mobility (R26.89);Muscle weakness (generalized) (M62.81)     Time: 7846-9629 PT Time Calculation (min) (ACUTE ONLY): 21 min  Charges:    $Therapeutic Exercise: 8-22 mins $Therapeutic  Activity: 8-22 mins PT General Charges $$ ACUTE PT VISIT: 1 Visit                     12:19 PM, 12/15/22 Ocie Bob, MPT Physical Therapist with [facility] [phone] office [phone] mobile phone

## 2022-12-15 NOTE — Progress Notes (Signed)
PROGRESS NOTE    [name] [name]  ZOX:[id number] DOB: October 27, 1940 DOA: 11/19/2022 PCP: Dorothyann Peng, MD   Brief Narrative:    [name] [name] is a 82 y.o. female with medical history significant for HFpEF, type 2 diabetes insulin-dependent, CKD stage IV, hypertension, dyslipidemia, chronic anemia of CKD, and prior CVA who presented to the ED with worsening shortness of breath as well as dry cough that began last night.  Patient was admitted with acute hypoxemic respiratory failure-multifactorial in the setting of volume overload as well as suspected pneumonia.  She is also noted to have worsening AKI on CKD stage IV and nephrology recommending hemodialysis, family now appears agreeable.  She is noted to have pleural effusion and will undergo thoracentesis today.  Anticipate discharge to skilled nursing facility pending bed availability in the next 24-48 hours.  Assessment & Plan:   Principal Problem:   Acute hypoxemic respiratory failure (HCC) Active Problems:   Chronic kidney disease (CKD), stage IV (severe) (HCC)   Essential hypertension   Mixed hyperlipidemia   Anemia due to chronic kidney disease   Diabetic retinopathy associated with type 2 diabetes mellitus (HCC)   Wound of left leg   End stage renal disease (HCC)   Protein-calorie malnutrition, severe  Assessment and Plan:  Acute hypoxemic respiratory failure-likely related to possible pneumonia -Off BiPAP; status post thoracentesis. -Left lower lobe atelectasis versus effusion with possible pneumonia. -no signs of acute infection currently. -Scheduled breathing treatments, flutter valve and incentive spirometry has been encouraged. -Continue to wean off oxygen supplementation as needed. Down to 1 L [place] -Antibiotics therapy has been completed.  Patient is overall stable.   AKI on CKD stage IV to V with metabolic acidosis Poor oral intake over the last 1 week noted Family now appears agreeable for hemodialysis and this  will be initiated per nephrology when appropriate -Status post dialysis catheter placement; and back-to-back initiation treatments (8/29>>8/30>>8/31); social worker aware to assist with outpatient dialysis chair.  -Patient so far has tolerated dialysis treatment; following available spot in the outpatient setting (At [facility] dialysis Tuesday-Thursday and Saturday); patient will mimic that schedule while in house. -Patient dialysis catheter will be re-placed by IR later today (12/15/22), planning for short hemodialysis treatment and ultrafiltration and intentions to restart outpatient schedule Tuesday, Thursday and Saturday on 12/16/2022.   -Continue to closely follow daily weights, volume status and response to HD. -Continue to follow nephrology service recommendation; appreciate their assistance in care.   Left lower extremity cellulitis/wound -Acute process resolved; patient has completed antibiotic therapy -Continue wound care treatment as recommended by wound care service. -DVT evaluation negative.   Elevated BNP -Does not appear to be related to acute CHF -Likely elevated in the setting of AKI on CKD stage IV-V patient a transition point currently based on GFR. -Continue to follow daily weights and strict I's and O's. -At the moment will continue volume management with dialysis treatments. -Continue low-sodium diet.   Anemia of CKD -Receives Retacrit injections outpatient with last dose 8/5 -No overt bleeding appreciated. -Continue to monitor with repeat CBC, no overt bleeding noted -Transfuse for hemoglobin less than 7 -Dose of Aranesp 60 mcg per nephrology -Further treatment with Epogen and IV iron as per nephrology discretion. -Continue to follow hemoglobin trend/stability. -No overt bleeding reported or appreciated.   DM2 with mild hyperglycemia/hypoglycemia -Continue on carb modified diet -Continue to follow CBGs fluctuation and adjust hypoglycemic regimen as needed. -Now the  patient has been started on dialysis mild hypoglycemic events  appreciated; insulin pump has been discontinue and the sliding scale insulin along with low-dose Semglee has been started.   Hypertension -Continue home antihypertensive agents -Continue as needed hydralazine -Follow vital signs -Anticipate improvement with dialysis -Continue to follow vital signs.   Dyslipidemia -Continue atorvastatin -Heart healthy diet discussed with patient.   PAF -Continue Eliquis for secondary prevention. -Continue metoprolol -Normal sinus rhythm per telemetry evaluation. -Continue to follow electrolytes. -Planning to resume the use of Eliquis after catheter placement by IR.   HFpEF Continue metoprolol Repeat 2D echocardiogram with previous 03/2021 with LVEF 65-70% with grade 1 diastolic dysfunction and moderate LVH Noted to have elevated BNP, but more likely related to significant renal dysfunction. -At this moment after discussing with nephrologist and following further deterioration in her renal function plan is to initiate dialysis -Dialysis catheter will be replaced by IR on 12/15/2022 with intention to continue volume management with dialysis treatment.  Hypokalemia -Continue to have electrolytes repleted throughout hemodialysis -Potassium will continue to be stabilized with hemodialysis treatment. -No abnormalities appreciated on telemetry.  Continue monitoring  Presumed dysphagia -Speech therapy has been consulted and will follow recommendations.   DVT prophylaxis: Continue Eliquis. Code Status: Full Family Communication: daughter at bedside. Disposition Plan:  Status is: Inpatient  Remains inpatient appropriate because: Need for IV antibiotics and inpatient procedures.   Consultants:  Nephrology Palliative care  Procedures:  Ultrasound thoracentesis pending 8/27  Antimicrobials:  Anti-infectives (From admission, onward)    Start     Dose/Rate Route Frequency Ordered Stop    12/13/22 1600  cefdinir (OMNICEF) capsule 300 mg        300 mg Oral Every 24 hours 12/13/22 1445 12/13/22 1717   12/12/22 2200  doxycycline (VIBRA-TABS) tablet 100 mg        100 mg Oral Every 12 hours 12/12/22 0930 12/13/22 2204   12/12/22 1500  cefTRIAXone (ROCEPHIN) 1 g in sodium chloride 0.9 % 100 mL IVPB  Status:  Discontinued        1 g 200 mL/hr over 30 Minutes Intravenous Every 24 hours 12/12/22 0930 12/13/22 1445   11/29/2022 1045  ceFAZolin (ANCEF) IVPB 2g/100 mL premix  Status:  Discontinued        2 g 200 mL/hr over 30 Minutes Intravenous On call to O.R. 11/11/2022 0932 11/19/2022 1333   12/05/22 2200  doxycycline (VIBRAMYCIN) 100 mg in sodium chloride 0.9 % 250 mL IVPB  Status:  Discontinued        100 mg 125 mL/hr over 120 Minutes Intravenous Every 12 hours 11/13/2022 1526 12/12/22 0930   12/05/22 1500  cefTRIAXone (ROCEPHIN) 1 g in sodium chloride 0.9 % 100 mL IVPB  Status:  Discontinued        1 g 200 mL/hr over 30 Minutes Intravenous Every 24 hours 11/17/2022 1526 12/12/22 0930   11/11/2022 1345  cefTRIAXone (ROCEPHIN) 1 g in sodium chloride 0.9 % 100 mL IVPB        1 g 200 mL/hr over 30 Minutes Intravenous  Once 11/28/2022 1335 11/15/2022 1537   11/22/2022 1345  doxycycline (VIBRA-TABS) tablet 100 mg        100 mg Oral  Once 11/23/2022 1335 12/08/2022 1507      Subjective: Chronically ill in appearance and deconditioned.  Following commands appropriately.  No overnight events.  Denies chest pain, no nausea, no vomiting.  Sleepy this morning.  Objective: Vitals:   12/14/22 2025 12/15/22 0425 12/15/22 0818 12/15/22 1516  BP: (!) 148/73 (!) 165/74  Marland Kitchen)  138/58  Pulse: 83 84  73  Resp: 20 20  16   Temp: 98.6 F (37 C) 97.8 F (36.6 C)  98.2 F (36.8 C)  TempSrc: Oral   Oral  SpO2: 93% 97% 92% 96%  Weight:      Height:        Intake/Output Summary (Last 24 hours) at 12/15/2022 1601 Last data filed at 12/15/2022 0924 Gross per 24 hour  Intake 120 ml  Output 400 ml  Net -280 ml    Filed Weights   12/12/22 0500 12/12/22 1120 12/13/22 0500  Weight: 71.8 kg 71.8 kg 69.5 kg    Examination: General exam: Alert, awake, oriented x 2; following commands appropriately.  Slightly sleepy in the morning but in no acute distress.  No nausea, no vomiting, no chest pain. Respiratory system: No wheezing, no crackles, no using accessory muscle.  1 L nasal cannula supplementation in [name]. Cardiovascular system: Rate controlled, no rubs, no gallops, no JVD. Gastrointestinal system: Abdomen is nondistended, soft and nontender. No organomegaly or masses felt. Normal bowel sounds heard. Central nervous system: Alert and oriented. No focal neurological deficits. Extremities: No cyanosis, clubbing or edema.  Left lower extremity anterior shin with clean dressings in [name]. Skin: No petechiae. Psychiatry: Stable mood.  Data Reviewed: I have personally reviewed following labs and imaging studies  CBC: Recent Labs  Lab 12/09/22 0410 12/12/22 1405 12/13/22 0500  WBC 18.0* 13.8* 11.4*  HGB 9.0* 8.0* 7.7*  HCT 27.4* 24.5* 23.7*  MCV 89.0 87.8 89.8  PLT 446* 474* 406*   Basic Metabolic Panel: Recent Labs  Lab 12/11/22 0420 12/12/22 0609 12/13/22 0500 12/14/22 0453 12/15/22 0412  NA 136 135 135 136 136  K 3.7 4.1 4.1 3.9 3.2*  CL 99 97* 95* 97* 96*  CO2 24 21* 21* 21* 24  GLUCOSE 177* 208* 233* 218* 226*  BUN 42* 52* 29* 42* 48*  CREATININE 4.43* 5.05* 3.23* 4.07* 4.90*  CALCIUM 8.5* 8.5* 8.3* 8.4* 8.5*  PHOS 5.4* 4.9* 3.5 3.7 3.6   GFR: Estimated Creatinine Clearance: 7.8 mL/min (A) (by C-G formula based on SCr of 4.9 mg/dL (H)).  CBG: Recent Labs  Lab 12/14/22 2022 12/14/22 2357 12/15/22 0422 12/15/22 0723 12/15/22 1112  GLUCAP 213* 217* 214* 221* 154*    Recent Results (from the past 240 hour(s))  Gram stain     Status: None   Collection Time: 12/06/22  9:40 AM   Specimen: Pleura  Result Value Ref Range Status   Specimen Description PLEURAL  Final    Special Requests NONE  Final   Gram Stain   Final    CYTOSPIN SMEAR NO ORGANISMS SEEN WBC PRESENT,BOTH PMN AND MONONUCLEAR Performed at [facility], [address], [place], [place] [address]    Report Status 12/06/2022 FINAL  Final  Culture, body fluid w Gram Stain-bottle     Status: None   Collection Time: 12/06/22  9:40 AM   Specimen: Pleura  Result Value Ref Range Status   Specimen Description PLEURAL  Final   Special Requests 10CC BOTTLES DRAWN AEROBIC AND ANAEROBIC  Final   Culture   Final    NO GROWTH 5 DAYS Performed at [facility], [address] [place], [place] [address]    Report Status 12/11/2022 FINAL  Final  Surgical PCR screen     Status: None   Collection Time: 12/01/2022  3:07 AM   Specimen: Nasal Mucosa; Nasal Swab  Result Value Ref Range Status   MRSA, PCR  NEGATIVE NEGATIVE Final   Staphylococcus aureus NEGATIVE NEGATIVE Final    Comment: (NOTE) The Xpert SA Assay (FDA approved for NASAL specimens in patients 65 years of age and older), is one component of a comprehensive surveillance program. It is not intended to diagnose infection nor to guide or monitor treatment. Performed at [facility], [address] [place], [place] [address]     Radiology Studies: DG Chest 1 View  Result Date: 12/14/2022 CLINICAL DATA:  [id number]. Extravasation injury of IV catheter site with other complication. The patient pulled out his right IJ hemodialysis catheter. Admitted for hypoxic respiratory failure. EXAM: CHEST  1 VIEW COMPARISON:  Portable chest 12/11/2022 FINDINGS: 12:53 a.m. interval removal of previous right IJ dialysis catheter. No pneumothorax is seen. Bilateral moderate-sized pleural effusions continue to be noted with overlying consolidation or atelectasis of the lower lung fields. The heart is enlarged. Perihilar vascular congestion and mild central interstitial edema appear similar. The upper lung fields remain clear. The mediastinum is stable with aortic  atherosclerosis. Overall aeration is unchanged with no new abnormality. No new osseous findings. IMPRESSION: 1. Interval removal of previous right IJ dialysis catheter. No pneumothorax. 2. Stable cardiomegaly, perihilar vascular congestion and mild central interstitial edema. 3. Stable moderate-sized bilateral pleural effusions with overlying consolidation or atelectasis in the lower lung fields. Electronically Signed   By: Almira Bar M.D.   On: 12/14/2022 01:29    Scheduled Meds:  amLODipine  5 mg Oral Daily   atorvastatin  20 mg Oral Daily   budesonide (PULMICORT) nebulizer solution  0.25 mg Nebulization BID   Chlorhexidine Gluconate Cloth  6 each Topical Q0600   darbepoetin (ARANESP) injection - DIALYSIS  60 mcg Subcutaneous Q Tue-1800   famotidine  20 mg Oral QHS   feeding supplement (NEPRO CARB STEADY)  237 mL Oral TID BM   fluticasone  1 spray Each Nare Daily   hydrALAZINE  25 mg Oral TID   influenza vaccine adjuvanted  0.5 mL Intramuscular Tomorrow-1000   insulin aspart  0-6 Units Subcutaneous TID WC   insulin glargine-yfgn  5 Units Subcutaneous Daily   isosorbide mononitrate  60 mg Oral Daily   latanoprost  1 drop Both Eyes QHS   loratadine  10 mg Oral Daily   metoprolol succinate  12.5 mg Oral Daily   multivitamin  1 tablet Oral QHS   Continuous Infusions:     LOS: 11 days   Time spent: 55 minutes  Jaydian Santana,MD [facility]  If 7PM-7AM, please contact night-coverage [email or web address] 12/15/2022, 4:01 PM

## 2022-12-15 NOTE — Progress Notes (Signed)
Patient yelled out for the "bathroom". I assisted patient on to the bedside commode. Patient did not void while sitting on bedside commode. Patient continued to ask for the bathroom while sitting on the bedside commode and for "Odell". Attempted to reorient patient and let her know that she is in the hospital. Assisted patient back to bed.  Plan of care ongoing.

## 2022-12-15 NOTE — Inpatient Diabetes Management (Signed)
Inpatient Diabetes Program Recommendations  AACE/ADA: New Consensus Statement on Inpatient Glycemic Control   Target Ranges:  Prepandial:   less than 140 mg/dL      Peak postprandial:   less than 180 mg/dL (1-2 hours)      Critically ill patients:  140 - 180 mg/dL    Latest Reference Range & Units 12/14/22 07:31 12/14/22 11:20 12/14/22 17:04 12/14/22 20:22 12/14/22 23:57 12/15/22 04:22 12/15/22 07:23  Glucose-Capillary 70 - 99 mg/dL 161 (H) 096 (H) 045 (H) 213 (H) 217 (H) 214 (H) 221 (H)   Review of Glycemic Control  Diabetes history: DM2 Outpatient Diabetes medications: OmniPod insulin pump with Humalog, Libre CGM, Ozempic 1 mg Qweek Current orders for Inpatient glycemic control: Semglee 5 units daily, Novolog 0-6 units TID with meals   Inpatient Diabetes Program Recommendations:     Insulin: CBG 221 mg/dl today. Please consider increasing Semglee to 7 units daily and adding Novolog 0-5 units at bedtime for bedtime correction.  Thanks, [name], RN, MSN, CDCES Diabetes Coordinator Inpatient Diabetes Program [phone] (Team Pager from 8am to 5pm)

## 2022-12-15 NOTE — Progress Notes (Signed)
Patient ID: [name] [name], female   DOB: [birth year], 82 y.o.   MRN: [id number] S: After GOC discussions family/patient decided to continue with dialysis. Planning for tunneled HD cath w/ [facility] later today  Patient delirious last night. Somnolent this morning. Does not voice any complaints O:BP (!) 165/74 (BP Location: Right Arm)   Pulse 84   Temp 97.8 F (36.6 C)   Resp 20   Ht 5\' 2"  (1.575 m)   Wt 69.5 kg   SpO2 92%   BMI 28.02 kg/m   Intake/Output Summary (Last 24 hours) at 12/15/2022 1045 Last data filed at 12/15/2022 0924 Gross per 24 hour  Intake 240 ml  Output 400 ml  Net -160 ml   Intake/Output: I/O last 3 completed shifts: In: 480 [P.O.:480] Out: 250 [Urine:250]  Intake/Output this shift:  Total I/O In: 0  Out: 150 [Urine:150] Weight change:  Gen: NAD, sitting in bed, minimally interactive CVS: normal rate, no rub Resp:bilateral chest rise, no increased work of breathing Abd: nondistended,  nontender Ext: no edema, warm and well perfused  Recent Labs  Lab 12/09/22 0410 12/10/22 0429 12/11/22 0420 12/12/22 0609 12/13/22 0500 12/14/22 0453 12/15/22 0412  NA 136 136 136 135 135 136 136  K 3.1* 3.7 3.7 4.1 4.1 3.9 3.2*  CL 99 98 99 97* 95* 97* 96*  CO2 26 27 24  21* 21* 21* 24  GLUCOSE 113* 138* 177* 208* 233* 218* 226*  BUN 38* 30* 42* 52* 29* 42* 48*  CREATININE 4.62* 3.62* 4.43* 5.05* 3.23* 4.07* 4.90*  ALBUMIN 2.2* 2.1* 2.0* 2.4* 2.4* 2.5* 2.4*  CALCIUM 7.8* 8.1* 8.5* 8.5* 8.3* 8.4* 8.5*  PHOS 4.0 3.9 5.4* 4.9* 3.5 3.7 3.6   Liver Function Tests: Recent Labs  Lab 12/13/22 0500 12/14/22 0453 12/15/22 0412  ALBUMIN 2.4* 2.5* 2.4*   No results for input(s): "LIPASE", "AMYLASE" in the last 168 hours. No results for input(s): "AMMONIA" in the last 168 hours. CBC: Recent Labs  Lab 12/09/22 0410 12/12/22 1405 12/13/22 0500  WBC 18.0* 13.8* 11.4*  HGB 9.0* 8.0* 7.7*  HCT 27.4* 24.5* 23.7*  MCV 89.0 87.8 89.8  PLT 446* 474* 406*    Cardiac Enzymes: No results for input(s): "CKTOTAL", "CKMB", "CKMBINDEX", "TROPONINI" in the last 168 hours. CBG: Recent Labs  Lab 12/14/22 1704 12/14/22 2022 12/14/22 2357 12/15/22 0422 12/15/22 0723  GLUCAP 168* 213* 217* 214* 221*    Iron Studies:  Recent Labs    12/13/22 0500  IRON 26*  TIBC 159*   Studies/Results: DG Chest 1 View  Result Date: 12/14/2022 CLINICAL DATA:  [id number]. Extravasation injury of IV catheter site with other complication. The patient pulled out his right IJ hemodialysis catheter. Admitted for hypoxic respiratory failure. EXAM: CHEST  1 VIEW COMPARISON:  Portable chest 12/11/2022 FINDINGS: 12:53 a.m. interval removal of previous right IJ dialysis catheter. No pneumothorax is seen. Bilateral moderate-sized pleural effusions continue to be noted with overlying consolidation or atelectasis of the lower lung fields. The heart is enlarged. Perihilar vascular congestion and mild central interstitial edema appear similar. The upper lung fields remain clear. The mediastinum is stable with aortic atherosclerosis. Overall aeration is unchanged with no new abnormality. No new osseous findings. IMPRESSION: 1. Interval removal of previous right IJ dialysis catheter. No pneumothorax. 2. Stable cardiomegaly, perihilar vascular congestion and mild central interstitial edema. 3. Stable moderate-sized bilateral pleural effusions with overlying consolidation or atelectasis in the lower lung fields. Electronically Signed   By: Almira Bar  M.D.   On: 12/14/2022 01:29    amLODipine  5 mg Oral Daily   atorvastatin  20 mg Oral Daily   budesonide (PULMICORT) nebulizer solution  0.25 mg Nebulization BID   Chlorhexidine Gluconate Cloth  6 each Topical Q0600   darbepoetin (ARANESP) injection - DIALYSIS  60 mcg Subcutaneous Q Tue-1800   famotidine  20 mg Oral QHS   feeding supplement (NEPRO CARB STEADY)  237 mL Oral TID BM   fluticasone  1 spray Each Nare Daily   hydrALAZINE  25 mg  Oral TID   influenza vaccine adjuvanted  0.5 mL Intramuscular Tomorrow-1000   insulin aspart  0-6 Units Subcutaneous TID WC   insulin glargine-yfgn  5 Units Subcutaneous Daily   isosorbide mononitrate  60 mg Oral Daily   latanoprost  1 drop Both Eyes QHS   loratadine  10 mg Oral Daily   metoprolol succinate  12.5 mg Oral Daily   multivitamin  1 tablet Oral QHS    BMET    Component Value Date/Time   NA 136 12/15/2022 0412   NA 142 06/26/2022 0000   K 3.2 (L) 12/15/2022 0412   CL 96 (L) 12/15/2022 0412   CO2 24 12/15/2022 0412   GLUCOSE 226 (H) 12/15/2022 0412   BUN 48 (H) 12/15/2022 0412   BUN 51 (A) 06/26/2022 0000   CREATININE 4.90 (H) 12/15/2022 0412   CREATININE 1.95 (H) 10/09/2018 0905   CALCIUM 8.5 (L) 12/15/2022 0412   GFRNONAA 8 (L) 12/15/2022 0412   GFRNONAA 24 (L) 10/09/2018 0905   GFRAA 20 (L) 03/15/2020 1149   GFRAA 27 (L) 10/09/2018 0905   CBC    Component Value Date/Time   WBC 11.4 (H) 12/13/2022 0500   RBC 2.64 (L) 12/13/2022 0500   HGB 7.7 (L) 12/13/2022 0500   HGB 9.1 (L) 05/30/2022 1001   HCT 23.7 (L) 12/13/2022 0500   HCT 28.3 (L) 05/30/2022 1001   PLT 406 (H) 12/13/2022 0500   PLT 357 05/30/2022 1001   MCV 89.8 12/13/2022 0500   MCV 91 05/30/2022 1001   MCH 29.2 12/13/2022 0500   MCHC 32.5 12/13/2022 0500   RDW 15.9 (H) 12/13/2022 0500   RDW 12.7 05/30/2022 1001   LYMPHSABS 1.5 10/16/2022 1438   MONOABS 0.8 10/16/2022 1438   EOSABS 0.2 10/16/2022 1438   BASOSABS 0.1 10/16/2022 1438    Assessment/Plan:   Acute hypoxic respiratory failure - possible pneumonia vs volume overload.  Status post thoracentesis.  Some improvement with dialysis.  Continue with ultrafiltration as tolerated CKD stage V now ESRD- pt has been followed in our office for quite some time and she has always stated that she would rather die than go on dialysis, however it appears that her family has changed her mind.  We discussed the need for [facility] and eventually an AVF/AVG.   We also discussed home dialysis with PD and HHD.  Family is interested.  She had TDC placed 11/23/2022 by Dr. Henreitta Leber and had her first HD session on 8/29 tshe was scheduled for dialysis TTS outpatient.  Pulled out HD cath on 12/14/22.  Further GOC discussion after patient removed TDC and continue with plans for HD HD cath today w/ IR; appreciate help Plan on HD today or tomorrow pending when she gets back to [facility] Continue with outpatient planning; in her current mental state it is hard to say if she will qualify for outpatient HD at this time. Will need to see how she does on dialysis.  If she cannot sit in a chair for the whole session we may need to look into LTACH options Will monitor the patient closely with you and intervene or adjust therapy as indicated by changes in clinical status/labs  Left leg cellulitis - with wound. Antibiotics completed Anemia of ESRD: on ESA and has received IV iron DM type 2 - per primary Hypokalemia - replace when necessary; 4K bath HTN/Volume - UF as tolerated; continue current meds Debilitation - plan has been per SNF; follow progress; consider LTACH if needed Disposition - pt's family would like to go to [facility] as it is closer to her home.  Continue with plans and caveats as above  Darnell Level  [facility]

## 2022-12-15 NOTE — Consult Note (Signed)
Overall aeration is unchanged with no new abnormality. No new osseous findings. IMPRESSION: 1. Interval removal of previous right IJ dialysis catheter. No pneumothorax. 2. Stable cardiomegaly, perihilar vascular congestion and mild central interstitial edema. 3. Stable moderate-sized bilateral pleural effusions with overlying consolidation or atelectasis in the lower lung fields. Electronically Signed   By: Almira Bar M.D.   On: 12/14/2022 01:29   DG CHEST PORT 1 VIEW  Result Date: 12/11/2022 CLINICAL DATA:  Increased shortness of breath. EXAM: PORTABLE CHEST 1 VIEW COMPARISON:  Radiographs 11/21/2022 and 12/06/2022. FINDINGS: 0802 hours. Right IJ hemodialysis catheter projects to the level of the superior cavoatrial junction. The heart size and mediastinal contours are stable with aortic atherosclerosis. There are increased bibasilar pulmonary opacities with probable enlarging pleural effusions and mild pulmonary edema. No pneumothorax. The bones appear unchanged. Telemetry leads overlie the chest. IMPRESSION: Increasing bibasilar pulmonary opacities, likely a combination of enlarging pleural effusions and pulmonary edema. Findings suggest congestive heart failure. Electronically Signed   By: Carey Bullocks M.D.   On: 12/11/2022 11:33   DG Chest Port 1 View  Result Date: 11/13/2022 CLINICAL DATA:  Dialysis catheter placement. EXAM: PORTABLE CHEST 1 VIEW COMPARISON:  12/06/2022 FINDINGS: Right IJ central line tip overlies the distal SVC level. No evidence for  pneumothorax. Diffuse vascular congestion with interstitial pulmonary edema, progressive in the interval. Bibasilar collapse/consolidation with small right and tiny left pleural effusions. The cardio pericardial silhouette is enlarged. IMPRESSION: 1. Right IJ central line tip overlies the distal SVC level. No pneumothorax. 2. Progressive vascular congestion and interstitial pulmonary edema. 3. Bibasilar collapse/consolidation with small right and tiny left pleural effusions. Electronically Signed   By: Kennith Center M.D.   On: 12/05/2022 13:38   DG C-Arm 1-60 Min-No Report  Result Date: 11/25/2022 Fluoroscopy was utilized by the requesting physician.  No radiographic interpretation.   US THORACENTESIS ASP PLEURAL SPACE W/IMG GUIDE  Result Date: 12/06/2022 INDICATION: Symptomatic LEFT sided pleural effusion EXAM: US THORACENTESIS ASP PLEURAL SPACE W/IMG GUIDE COMPARISON:  Chest XR, 12/06/2022. MEDICATIONS: None. COMPLICATIONS: None immediate. TECHNIQUE: Informed written consent was obtained from the patient and/or patient's representative after a discussion of the risks, benefits and alternatives to treatment. A timeout was performed prior to the initiation of the procedure. Initial ultrasound scanning demonstrates a small-to-moderate volume LEFT pleural effusion. The lower chest was prepped and draped in the usual sterile fashion. 1% lidocaine was used for local anesthesia. An ultrasound image was saved for documentation purposes. An 8 Fr Safe-T-Centesis catheter was introduced. The thoracentesis was performed. The catheter was removed and a dressing was applied. The patient tolerated the procedure well without immediate post procedural complication. The patient was escorted to have an upright chest radiograph. FINDINGS: A total of approximately 700 mL of serous pleural fluid was removed. Requested samples were sent to the laboratory. IMPRESSION: 1. Successful ultrasound-guided diagnostic and therapeutic  LEFT sided thoracentesis yielding 700 mL of pleural fluid. 2. A small volume pleural effusion is present on the contralateral RIGHT chest. Roanna Banning, MD [facility] Rad[facility] Electronically Signed   By: Roanna Banning M.D.   On: 12/06/2022 13:05   DG Chest 1 View  Result Date: 12/06/2022 CLINICAL DATA:  Status post left thoracentesis. EXAM: CHEST  1 VIEW COMPARISON:  11/30/2022 FINDINGS: Stable cardiomediastinal contours. Aortic atherosclerosis. No significant pneumothorax identified status post left thoracentesis. Significant decreased volume of left pleural effusion with improved aeration to the right base. Unchanged small right effusion. IMPRESSION: 1.  Sister    Hypertension Brother    Diabetes Brother    Breast cancer Neg Hx     Social History   Socioeconomic History   Marital status: Married    Spouse name: Not on file   Number of children: Not on file   Years of education: Not on file   Highest education level: Not on file  Occupational History   Occupation: retired  Tobacco Use   Smoking status: Never   Smokeless tobacco: Never  Vaping Use   Vaping status: Never Used  Substance and Sexual Activity   Alcohol use: No   Drug use: Never   Sexual activity: Not Currently  Other Topics Concern   Not on file  Social History Narrative   Not on file   Social Determinants of Health   Financial Resource Strain: Low Risk  (10/19/2021)   Overall Financial Resource Strain (CARDIA)    Difficulty of  Paying Living Expenses: Not hard at all  Food Insecurity: No Food Insecurity (11/19/2022)   Hunger Vital Sign    Worried About Running Out of Food in the Last Year: Never true    Ran Out of Food in the Last Year: Never true  Transportation Needs: No Transportation Needs (12/02/2022)   PRAPARE - Administrator, Civil Service (Medical): No    Lack of Transportation (Non-Medical): No  Physical Activity: Inactive (10/19/2021)   Exercise Vital Sign    Days of Exercise per Week: 0 days    Minutes of Exercise per Session: 0 min  Stress: No Stress Concern Present (10/19/2021)   [employer] of Occupational Health - Occupational Stress Questionnaire    Feeling of Stress : Not at all  Social Connections: Not on file    Review of Systems: A 12 point ROS discussed and pertinent positives are indicated in the HPI above.  All other systems are negative.  Review of Systems  Constitutional:  Positive for fatigue.  Gastrointestinal:  Positive for nausea.    Vital Signs: BP (!) 165/74 (BP Location: Right Arm)   Pulse 84   Temp 97.8 F (36.6 C)   Resp 20   Ht 5\' 2"  (1.575 m)   Wt 153 lb 3.5 oz (69.5 kg)   SpO2 92%   BMI 28.02 kg/m   Physical Exam Constitutional:      Appearance: She is ill-appearing.  Cardiovascular:     Rate and Rhythm: Normal rate.     Pulses: Normal pulses.     Comments: Right upper chest dressing from prior TDC. Dressing is clean/dry.  Pulmonary:     Effort: Pulmonary effort is normal.  Abdominal:     Tenderness: There is no abdominal tenderness.  Skin:    General: Skin is warm and dry.  Neurological:     Mental Status: She is alert. She is disoriented.     Imaging: DG Chest 1 View  Result Date: 12/14/2022 CLINICAL DATA:  [id number]. Extravasation injury of IV catheter site with other complication. The patient pulled out his right IJ hemodialysis catheter. Admitted for hypoxic respiratory failure. EXAM: CHEST  1 VIEW COMPARISON:  Portable chest  12/11/2022 FINDINGS: 12:53 a.m. interval removal of previous right IJ dialysis catheter. No pneumothorax is seen. Bilateral moderate-sized pleural effusions continue to be noted with overlying consolidation or atelectasis of the lower lung fields. The heart is enlarged. Perihilar vascular congestion and mild central interstitial edema appear similar. The upper lung fields remain clear. The mediastinum is stable with aortic atherosclerosis.  54.15 ml/m  RA Volume:   33.00 ml  19.97 ml/m LA Vol (A4C):   59.7 ml 36.12 ml/m LA Biplane Vol: 74.9 ml 45.32 ml/m  AORTIC VALVE                     PULMONIC VALVE AV Area (Vmax):    1.42 cm      PR End Diast Vel: 7.51 msec AV Area (Vmean):   1.51 cm AV Area (VTI):     1.41 cm AV Vmax:           175.57 cm/s AV Vmean:          110.256 cm/s AV VTI:            0.346 m AV Peak Grad:      12.3 mmHg AV Mean Grad:      5.7 mmHg LVOT Vmax:         98.20 cm/s LVOT Vmean:        65.600 cm/s LVOT VTI:          0.192 m LVOT/AV VTI ratio: 0.55  AORTA Ao Root diam: 3.20 cm Ao Asc diam:  3.00 cm MITRAL VALVE                TRICUSPID VALVE MV Area (PHT): 2.96 cm     TR Peak grad:   53.6 mmHg MV Mean grad:  3.0 mmHg     TR Vmax:        366.00 cm/s MV Decel Time: 256 msec MR Peak grad: 107.5 mmHg    SHUNTS MR Mean grad: 80.0 mmHg     Systemic VTI:  0.19 m MR Vmax:      518.50 cm/s   Systemic Diam: 1.80 cm MR Vmean:     428.0 cm/s MR PISA:      1.57 cm MV E velocity: 172.00 cm/s MV A velocity: 132.00 cm/s MV E/A ratio:  1.30 Dina Rich MD Electronically signed by Dina Rich MD Signature Date/Time: 12/05/2022/3:48:21 PM    Final    US RENAL  Result Date: 12/05/2022 CLINICAL DATA:  Acute renal insufficiency EXAM: RENAL / URINARY TRACT ULTRASOUND COMPLETE  COMPARISON:  05/19/2020 FINDINGS: Right Kidney: Renal measurements: 10.8 x 6.1 x 4.2 cm = volume: 146.5 ML. Echogenicity within normal limits. No mass or hydronephrosis visualized. Left Kidney: Renal measurements: 9.4 x 4.9 x 3.9 cm = volume: 94.0 mL. Echogenicity within normal limits. No mass or hydronephrosis visualized. Bladder: Appears normal for degree of bladder distention. Other: Bilateral pleural effusions are incidentally noted, left greater than right. IMPRESSION: 1. Incidental bilateral pleural effusions, left greater than right. 2. Otherwise unremarkable renal ultrasound. Electronically Signed   By: Sharlet Salina M.D.   On: 12/05/2022 14:50   US Venous Img Lower  Left (DVT Study)  Result Date: 12/03/2022 CLINICAL DATA:  Short of breath, left lower extremity wound EXAM: LEFT LOWER EXTREMITY VENOUS DOPPLER ULTRASOUND TECHNIQUE: Gray-scale sonography with compression, as well as color and duplex ultrasound, were performed to evaluate the deep venous system(s) from the level of the common femoral vein through the popliteal and proximal calf veins. COMPARISON:  None Available. FINDINGS: VENOUS Normal compressibility of the common femoral, superficial femoral, and popliteal veins, as well as the visualized calf veins. Visualized portions of profunda femoral vein and great saphenous vein unremarkable. No filling defects to suggest DVT on grayscale or color Doppler imaging. Doppler waveforms show normal direction of venous flow, normal respiratory plasticity and response to augmentation.  Sister    Hypertension Brother    Diabetes Brother    Breast cancer Neg Hx     Social History   Socioeconomic History   Marital status: Married    Spouse name: Not on file   Number of children: Not on file   Years of education: Not on file   Highest education level: Not on file  Occupational History   Occupation: retired  Tobacco Use   Smoking status: Never   Smokeless tobacco: Never  Vaping Use   Vaping status: Never Used  Substance and Sexual Activity   Alcohol use: No   Drug use: Never   Sexual activity: Not Currently  Other Topics Concern   Not on file  Social History Narrative   Not on file   Social Determinants of Health   Financial Resource Strain: Low Risk  (10/19/2021)   Overall Financial Resource Strain (CARDIA)    Difficulty of  Paying Living Expenses: Not hard at all  Food Insecurity: No Food Insecurity (11/19/2022)   Hunger Vital Sign    Worried About Running Out of Food in the Last Year: Never true    Ran Out of Food in the Last Year: Never true  Transportation Needs: No Transportation Needs (12/02/2022)   PRAPARE - Administrator, Civil Service (Medical): No    Lack of Transportation (Non-Medical): No  Physical Activity: Inactive (10/19/2021)   Exercise Vital Sign    Days of Exercise per Week: 0 days    Minutes of Exercise per Session: 0 min  Stress: No Stress Concern Present (10/19/2021)   [employer] of Occupational Health - Occupational Stress Questionnaire    Feeling of Stress : Not at all  Social Connections: Not on file    Review of Systems: A 12 point ROS discussed and pertinent positives are indicated in the HPI above.  All other systems are negative.  Review of Systems  Constitutional:  Positive for fatigue.  Gastrointestinal:  Positive for nausea.    Vital Signs: BP (!) 165/74 (BP Location: Right Arm)   Pulse 84   Temp 97.8 F (36.6 C)   Resp 20   Ht 5\' 2"  (1.575 m)   Wt 153 lb 3.5 oz (69.5 kg)   SpO2 92%   BMI 28.02 kg/m   Physical Exam Constitutional:      Appearance: She is ill-appearing.  Cardiovascular:     Rate and Rhythm: Normal rate.     Pulses: Normal pulses.     Comments: Right upper chest dressing from prior TDC. Dressing is clean/dry.  Pulmonary:     Effort: Pulmonary effort is normal.  Abdominal:     Tenderness: There is no abdominal tenderness.  Skin:    General: Skin is warm and dry.  Neurological:     Mental Status: She is alert. She is disoriented.     Imaging: DG Chest 1 View  Result Date: 12/14/2022 CLINICAL DATA:  [id number]. Extravasation injury of IV catheter site with other complication. The patient pulled out his right IJ hemodialysis catheter. Admitted for hypoxic respiratory failure. EXAM: CHEST  1 VIEW COMPARISON:  Portable chest  12/11/2022 FINDINGS: 12:53 a.m. interval removal of previous right IJ dialysis catheter. No pneumothorax is seen. Bilateral moderate-sized pleural effusions continue to be noted with overlying consolidation or atelectasis of the lower lung fields. The heart is enlarged. Perihilar vascular congestion and mild central interstitial edema appear similar. The upper lung fields remain clear. The mediastinum is stable with aortic atherosclerosis.  Sister    Hypertension Brother    Diabetes Brother    Breast cancer Neg Hx     Social History   Socioeconomic History   Marital status: Married    Spouse name: Not on file   Number of children: Not on file   Years of education: Not on file   Highest education level: Not on file  Occupational History   Occupation: retired  Tobacco Use   Smoking status: Never   Smokeless tobacco: Never  Vaping Use   Vaping status: Never Used  Substance and Sexual Activity   Alcohol use: No   Drug use: Never   Sexual activity: Not Currently  Other Topics Concern   Not on file  Social History Narrative   Not on file   Social Determinants of Health   Financial Resource Strain: Low Risk  (10/19/2021)   Overall Financial Resource Strain (CARDIA)    Difficulty of  Paying Living Expenses: Not hard at all  Food Insecurity: No Food Insecurity (11/19/2022)   Hunger Vital Sign    Worried About Running Out of Food in the Last Year: Never true    Ran Out of Food in the Last Year: Never true  Transportation Needs: No Transportation Needs (12/02/2022)   PRAPARE - Administrator, Civil Service (Medical): No    Lack of Transportation (Non-Medical): No  Physical Activity: Inactive (10/19/2021)   Exercise Vital Sign    Days of Exercise per Week: 0 days    Minutes of Exercise per Session: 0 min  Stress: No Stress Concern Present (10/19/2021)   [employer] of Occupational Health - Occupational Stress Questionnaire    Feeling of Stress : Not at all  Social Connections: Not on file    Review of Systems: A 12 point ROS discussed and pertinent positives are indicated in the HPI above.  All other systems are negative.  Review of Systems  Constitutional:  Positive for fatigue.  Gastrointestinal:  Positive for nausea.    Vital Signs: BP (!) 165/74 (BP Location: Right Arm)   Pulse 84   Temp 97.8 F (36.6 C)   Resp 20   Ht 5\' 2"  (1.575 m)   Wt 153 lb 3.5 oz (69.5 kg)   SpO2 92%   BMI 28.02 kg/m   Physical Exam Constitutional:      Appearance: She is ill-appearing.  Cardiovascular:     Rate and Rhythm: Normal rate.     Pulses: Normal pulses.     Comments: Right upper chest dressing from prior TDC. Dressing is clean/dry.  Pulmonary:     Effort: Pulmonary effort is normal.  Abdominal:     Tenderness: There is no abdominal tenderness.  Skin:    General: Skin is warm and dry.  Neurological:     Mental Status: She is alert. She is disoriented.     Imaging: DG Chest 1 View  Result Date: 12/14/2022 CLINICAL DATA:  [id number]. Extravasation injury of IV catheter site with other complication. The patient pulled out his right IJ hemodialysis catheter. Admitted for hypoxic respiratory failure. EXAM: CHEST  1 VIEW COMPARISON:  Portable chest  12/11/2022 FINDINGS: 12:53 a.m. interval removal of previous right IJ dialysis catheter. No pneumothorax is seen. Bilateral moderate-sized pleural effusions continue to be noted with overlying consolidation or atelectasis of the lower lung fields. The heart is enlarged. Perihilar vascular congestion and mild central interstitial edema appear similar. The upper lung fields remain clear. The mediastinum is stable with aortic atherosclerosis.  Sister    Hypertension Brother    Diabetes Brother    Breast cancer Neg Hx     Social History   Socioeconomic History   Marital status: Married    Spouse name: Not on file   Number of children: Not on file   Years of education: Not on file   Highest education level: Not on file  Occupational History   Occupation: retired  Tobacco Use   Smoking status: Never   Smokeless tobacco: Never  Vaping Use   Vaping status: Never Used  Substance and Sexual Activity   Alcohol use: No   Drug use: Never   Sexual activity: Not Currently  Other Topics Concern   Not on file  Social History Narrative   Not on file   Social Determinants of Health   Financial Resource Strain: Low Risk  (10/19/2021)   Overall Financial Resource Strain (CARDIA)    Difficulty of  Paying Living Expenses: Not hard at all  Food Insecurity: No Food Insecurity (11/19/2022)   Hunger Vital Sign    Worried About Running Out of Food in the Last Year: Never true    Ran Out of Food in the Last Year: Never true  Transportation Needs: No Transportation Needs (12/02/2022)   PRAPARE - Administrator, Civil Service (Medical): No    Lack of Transportation (Non-Medical): No  Physical Activity: Inactive (10/19/2021)   Exercise Vital Sign    Days of Exercise per Week: 0 days    Minutes of Exercise per Session: 0 min  Stress: No Stress Concern Present (10/19/2021)   [employer] of Occupational Health - Occupational Stress Questionnaire    Feeling of Stress : Not at all  Social Connections: Not on file    Review of Systems: A 12 point ROS discussed and pertinent positives are indicated in the HPI above.  All other systems are negative.  Review of Systems  Constitutional:  Positive for fatigue.  Gastrointestinal:  Positive for nausea.    Vital Signs: BP (!) 165/74 (BP Location: Right Arm)   Pulse 84   Temp 97.8 F (36.6 C)   Resp 20   Ht 5\' 2"  (1.575 m)   Wt 153 lb 3.5 oz (69.5 kg)   SpO2 92%   BMI 28.02 kg/m   Physical Exam Constitutional:      Appearance: She is ill-appearing.  Cardiovascular:     Rate and Rhythm: Normal rate.     Pulses: Normal pulses.     Comments: Right upper chest dressing from prior TDC. Dressing is clean/dry.  Pulmonary:     Effort: Pulmonary effort is normal.  Abdominal:     Tenderness: There is no abdominal tenderness.  Skin:    General: Skin is warm and dry.  Neurological:     Mental Status: She is alert. She is disoriented.     Imaging: DG Chest 1 View  Result Date: 12/14/2022 CLINICAL DATA:  [id number]. Extravasation injury of IV catheter site with other complication. The patient pulled out his right IJ hemodialysis catheter. Admitted for hypoxic respiratory failure. EXAM: CHEST  1 VIEW COMPARISON:  Portable chest  12/11/2022 FINDINGS: 12:53 a.m. interval removal of previous right IJ dialysis catheter. No pneumothorax is seen. Bilateral moderate-sized pleural effusions continue to be noted with overlying consolidation or atelectasis of the lower lung fields. The heart is enlarged. Perihilar vascular congestion and mild central interstitial edema appear similar. The upper lung fields remain clear. The mediastinum is stable with aortic atherosclerosis.  Overall aeration is unchanged with no new abnormality. No new osseous findings. IMPRESSION: 1. Interval removal of previous right IJ dialysis catheter. No pneumothorax. 2. Stable cardiomegaly, perihilar vascular congestion and mild central interstitial edema. 3. Stable moderate-sized bilateral pleural effusions with overlying consolidation or atelectasis in the lower lung fields. Electronically Signed   By: Almira Bar M.D.   On: 12/14/2022 01:29   DG CHEST PORT 1 VIEW  Result Date: 12/11/2022 CLINICAL DATA:  Increased shortness of breath. EXAM: PORTABLE CHEST 1 VIEW COMPARISON:  Radiographs 11/21/2022 and 12/06/2022. FINDINGS: 0802 hours. Right IJ hemodialysis catheter projects to the level of the superior cavoatrial junction. The heart size and mediastinal contours are stable with aortic atherosclerosis. There are increased bibasilar pulmonary opacities with probable enlarging pleural effusions and mild pulmonary edema. No pneumothorax. The bones appear unchanged. Telemetry leads overlie the chest. IMPRESSION: Increasing bibasilar pulmonary opacities, likely a combination of enlarging pleural effusions and pulmonary edema. Findings suggest congestive heart failure. Electronically Signed   By: Carey Bullocks M.D.   On: 12/11/2022 11:33   DG Chest Port 1 View  Result Date: 11/13/2022 CLINICAL DATA:  Dialysis catheter placement. EXAM: PORTABLE CHEST 1 VIEW COMPARISON:  12/06/2022 FINDINGS: Right IJ central line tip overlies the distal SVC level. No evidence for  pneumothorax. Diffuse vascular congestion with interstitial pulmonary edema, progressive in the interval. Bibasilar collapse/consolidation with small right and tiny left pleural effusions. The cardio pericardial silhouette is enlarged. IMPRESSION: 1. Right IJ central line tip overlies the distal SVC level. No pneumothorax. 2. Progressive vascular congestion and interstitial pulmonary edema. 3. Bibasilar collapse/consolidation with small right and tiny left pleural effusions. Electronically Signed   By: Kennith Center M.D.   On: 12/05/2022 13:38   DG C-Arm 1-60 Min-No Report  Result Date: 11/25/2022 Fluoroscopy was utilized by the requesting physician.  No radiographic interpretation.   US THORACENTESIS ASP PLEURAL SPACE W/IMG GUIDE  Result Date: 12/06/2022 INDICATION: Symptomatic LEFT sided pleural effusion EXAM: US THORACENTESIS ASP PLEURAL SPACE W/IMG GUIDE COMPARISON:  Chest XR, 12/06/2022. MEDICATIONS: None. COMPLICATIONS: None immediate. TECHNIQUE: Informed written consent was obtained from the patient and/or patient's representative after a discussion of the risks, benefits and alternatives to treatment. A timeout was performed prior to the initiation of the procedure. Initial ultrasound scanning demonstrates a small-to-moderate volume LEFT pleural effusion. The lower chest was prepped and draped in the usual sterile fashion. 1% lidocaine was used for local anesthesia. An ultrasound image was saved for documentation purposes. An 8 Fr Safe-T-Centesis catheter was introduced. The thoracentesis was performed. The catheter was removed and a dressing was applied. The patient tolerated the procedure well without immediate post procedural complication. The patient was escorted to have an upright chest radiograph. FINDINGS: A total of approximately 700 mL of serous pleural fluid was removed. Requested samples were sent to the laboratory. IMPRESSION: 1. Successful ultrasound-guided diagnostic and therapeutic  LEFT sided thoracentesis yielding 700 mL of pleural fluid. 2. A small volume pleural effusion is present on the contralateral RIGHT chest. Roanna Banning, MD [facility] Radiology Electronically Signed   By: Roanna Banning M.D.   On: 12/06/2022 13:05   DG Chest 1 View  Result Date: 12/06/2022 CLINICAL DATA:  Status post left thoracentesis. EXAM: CHEST  1 VIEW COMPARISON:  11/30/2022 FINDINGS: Stable cardiomediastinal contours. Aortic atherosclerosis. No significant pneumothorax identified status post left thoracentesis. Significant decreased volume of left pleural effusion with improved aeration to the right base. Unchanged small right effusion. IMPRESSION: 1.  Overall aeration is unchanged with no new abnormality. No new osseous findings. IMPRESSION: 1. Interval removal of previous right IJ dialysis catheter. No pneumothorax. 2. Stable cardiomegaly, perihilar vascular congestion and mild central interstitial edema. 3. Stable moderate-sized bilateral pleural effusions with overlying consolidation or atelectasis in the lower lung fields. Electronically Signed   By: Almira Bar M.D.   On: 12/14/2022 01:29   DG CHEST PORT 1 VIEW  Result Date: 12/11/2022 CLINICAL DATA:  Increased shortness of breath. EXAM: PORTABLE CHEST 1 VIEW COMPARISON:  Radiographs 11/21/2022 and 12/06/2022. FINDINGS: 0802 hours. Right IJ hemodialysis catheter projects to the level of the superior cavoatrial junction. The heart size and mediastinal contours are stable with aortic atherosclerosis. There are increased bibasilar pulmonary opacities with probable enlarging pleural effusions and mild pulmonary edema. No pneumothorax. The bones appear unchanged. Telemetry leads overlie the chest. IMPRESSION: Increasing bibasilar pulmonary opacities, likely a combination of enlarging pleural effusions and pulmonary edema. Findings suggest congestive heart failure. Electronically Signed   By: Carey Bullocks M.D.   On: 12/11/2022 11:33   DG Chest Port 1 View  Result Date: 11/13/2022 CLINICAL DATA:  Dialysis catheter placement. EXAM: PORTABLE CHEST 1 VIEW COMPARISON:  12/06/2022 FINDINGS: Right IJ central line tip overlies the distal SVC level. No evidence for  pneumothorax. Diffuse vascular congestion with interstitial pulmonary edema, progressive in the interval. Bibasilar collapse/consolidation with small right and tiny left pleural effusions. The cardio pericardial silhouette is enlarged. IMPRESSION: 1. Right IJ central line tip overlies the distal SVC level. No pneumothorax. 2. Progressive vascular congestion and interstitial pulmonary edema. 3. Bibasilar collapse/consolidation with small right and tiny left pleural effusions. Electronically Signed   By: Kennith Center M.D.   On: 12/05/2022 13:38   DG C-Arm 1-60 Min-No Report  Result Date: 11/25/2022 Fluoroscopy was utilized by the requesting physician.  No radiographic interpretation.   US THORACENTESIS ASP PLEURAL SPACE W/IMG GUIDE  Result Date: 12/06/2022 INDICATION: Symptomatic LEFT sided pleural effusion EXAM: US THORACENTESIS ASP PLEURAL SPACE W/IMG GUIDE COMPARISON:  Chest XR, 12/06/2022. MEDICATIONS: None. COMPLICATIONS: None immediate. TECHNIQUE: Informed written consent was obtained from the patient and/or patient's representative after a discussion of the risks, benefits and alternatives to treatment. A timeout was performed prior to the initiation of the procedure. Initial ultrasound scanning demonstrates a small-to-moderate volume LEFT pleural effusion. The lower chest was prepped and draped in the usual sterile fashion. 1% lidocaine was used for local anesthesia. An ultrasound image was saved for documentation purposes. An 8 Fr Safe-T-Centesis catheter was introduced. The thoracentesis was performed. The catheter was removed and a dressing was applied. The patient tolerated the procedure well without immediate post procedural complication. The patient was escorted to have an upright chest radiograph. FINDINGS: A total of approximately 700 mL of serous pleural fluid was removed. Requested samples were sent to the laboratory. IMPRESSION: 1. Successful ultrasound-guided diagnostic and therapeutic  LEFT sided thoracentesis yielding 700 mL of pleural fluid. 2. A small volume pleural effusion is present on the contralateral RIGHT chest. Roanna Banning, MD [facility] Radiology Electronically Signed   By: Roanna Banning M.D.   On: 12/06/2022 13:05   DG Chest 1 View  Result Date: 12/06/2022 CLINICAL DATA:  Status post left thoracentesis. EXAM: CHEST  1 VIEW COMPARISON:  11/30/2022 FINDINGS: Stable cardiomediastinal contours. Aortic atherosclerosis. No significant pneumothorax identified status post left thoracentesis. Significant decreased volume of left pleural effusion with improved aeration to the right base. Unchanged small right effusion. IMPRESSION: 1.  Sister    Hypertension Brother    Diabetes Brother    Breast cancer Neg Hx     Social History   Socioeconomic History   Marital status: Married    Spouse name: Not on file   Number of children: Not on file   Years of education: Not on file   Highest education level: Not on file  Occupational History   Occupation: retired  Tobacco Use   Smoking status: Never   Smokeless tobacco: Never  Vaping Use   Vaping status: Never Used  Substance and Sexual Activity   Alcohol use: No   Drug use: Never   Sexual activity: Not Currently  Other Topics Concern   Not on file  Social History Narrative   Not on file   Social Determinants of Health   Financial Resource Strain: Low Risk  (10/19/2021)   Overall Financial Resource Strain (CARDIA)    Difficulty of  Paying Living Expenses: Not hard at all  Food Insecurity: No Food Insecurity (11/19/2022)   Hunger Vital Sign    Worried About Running Out of Food in the Last Year: Never true    Ran Out of Food in the Last Year: Never true  Transportation Needs: No Transportation Needs (12/02/2022)   PRAPARE - Administrator, Civil Service (Medical): No    Lack of Transportation (Non-Medical): No  Physical Activity: Inactive (10/19/2021)   Exercise Vital Sign    Days of Exercise per Week: 0 days    Minutes of Exercise per Session: 0 min  Stress: No Stress Concern Present (10/19/2021)   [employer] of Occupational Health - Occupational Stress Questionnaire    Feeling of Stress : Not at all  Social Connections: Not on file    Review of Systems: A 12 point ROS discussed and pertinent positives are indicated in the HPI above.  All other systems are negative.  Review of Systems  Constitutional:  Positive for fatigue.  Gastrointestinal:  Positive for nausea.    Vital Signs: BP (!) 165/74 (BP Location: Right Arm)   Pulse 84   Temp 97.8 F (36.6 C)   Resp 20   Ht 5\' 2"  (1.575 m)   Wt 153 lb 3.5 oz (69.5 kg)   SpO2 92%   BMI 28.02 kg/m   Physical Exam Constitutional:      Appearance: She is ill-appearing.  Cardiovascular:     Rate and Rhythm: Normal rate.     Pulses: Normal pulses.     Comments: Right upper chest dressing from prior TDC. Dressing is clean/dry.  Pulmonary:     Effort: Pulmonary effort is normal.  Abdominal:     Tenderness: There is no abdominal tenderness.  Skin:    General: Skin is warm and dry.  Neurological:     Mental Status: She is alert. She is disoriented.     Imaging: DG Chest 1 View  Result Date: 12/14/2022 CLINICAL DATA:  [id number]. Extravasation injury of IV catheter site with other complication. The patient pulled out his right IJ hemodialysis catheter. Admitted for hypoxic respiratory failure. EXAM: CHEST  1 VIEW COMPARISON:  Portable chest  12/11/2022 FINDINGS: 12:53 a.m. interval removal of previous right IJ dialysis catheter. No pneumothorax is seen. Bilateral moderate-sized pleural effusions continue to be noted with overlying consolidation or atelectasis of the lower lung fields. The heart is enlarged. Perihilar vascular congestion and mild central interstitial edema appear similar. The upper lung fields remain clear. The mediastinum is stable with aortic atherosclerosis.

## 2022-12-15 NOTE — Progress Notes (Signed)
  HEMODIALYSIS TREATMENT NOTE (HD#5):   S/p new RIJ TDC.  Pre-HD pt is lying in bed, drowsy, having recently returned from [facility].  128/64, HR 78, spO2 96% on 3L. She opens her eyes to her voice, briefly makes eye contact but does not follow commands or answer orientation questions.  HD was initiated with 1L goal. Less than 20 minutes into treatment pt had an episode of hypotension 84/59 with loss of consciousness.  NS bolus was given, UF was paused, pt was placed in t'berg  Despite these interventions pt remained unresponsive.  She maintained pulse and respirations.  Rapid Response Team was paged.  Albumin 50g was given per Dr. Fredirick Lathe v.o.  BP ^ to 95/65 --- 147/75. Pt's mentation gradually improved, returning to pre-treatment baseline. HD continued without UF, per Dr. Eliane Decree t.o.  Hgb resulted at 7.0 and one unit pRBCs was transfused per Dr. Fredirick Lathe order.    3.5 hour session completed.  All blood was returned.  Net +1.5 L - Dr. Allena Katz was notified.  Post-HD:  12/15/22 2010  Vitals  Temp 98.4 F (36.9 C)  Temp Source Axillary  BP 121/72  MAP (mmHg) 87  BP Location Right Arm  BP Method Automatic  Patient Position (if appropriate) Lying  Pulse Rate 79  Pulse Rate Source Monitor  Oxygen Therapy  SpO2 96 %  O2 Device Nasal Cannula  O2 Flow Rate (L/min) 3 L/min  Post Treatment  Dialyzer Clearance Lightly streaked  Hemodialysis Intake (mL) 1500 mL  Liters Processed 79.3  Fluid Removed (mL) 0 mL  Tolerated HD Treatment No (Comment)  Post-Hemodialysis Comments Episode of hypotension with loss of consciousness 20 m into treatment.  No UF thereafter.  Hemodialysis Catheter Right Internal jugular Double lumen Permanent (Tunneled)  Placement Date/Time: 12/15/22 1303   Placed prior to admission: No  Serial / Lot #: [id number]  Expiration Date: 07/09/27  Time Out: Correct patient;Correct site;Correct procedure  Maximum sterile barrier precautions: Hand hygiene;Cap;Mask;Sterile  gown...  Site Condition No complications  Blue Lumen Status Flushed;Heparin locked;Dead end cap in place  Red Lumen Status Flushed;Heparin locked;Dead end cap in place  Purple Lumen Status N/A  Catheter fill solution Heparin 1000 units/ml  Catheter fill volume (Arterial) 1.6 cc  Catheter fill volume (Venous) 1.6  Dressing Type Transparent;Tube stabilization device  Dressing Status Antimicrobial disc in place;Clean, Dry, Intact  Interventions New dressing;Other (Comment)  Dressing Change Due 12/22/22  Post treatment catheter status Capped and Clamped    Arman Filter, RN AP [facility]

## 2022-12-15 NOTE — Progress Notes (Signed)
Patient's daughter pointed out to me that patient is now drooling. Stated, "she had not been doing that". Patient is able to smile and face is symmetrical. Plan of care ongoing.

## 2022-12-15 NOTE — Procedures (Signed)
Vascular and Interventional Radiology Procedure Note  Patient: [name] [name] DOB: [birth year] Medical Record Number: [id number] Note Date/Time: 12/15/22 1:20 PM   Performing Physician: Roanna Banning, MD Assistant(s): None  Diagnosis: ESRD requiring Hemodialysis  Procedure: TUNNELED HEMODIALYSIS CATHETER PLACEMENT  Anesthesia: Local Anesthetic Complications: None Estimated Blood Loss: Minimal Specimens:  None  Findings:  Successful placement of right-sided, 19 cm (tip-to-cuff), tunneled hemodialysis catheter with the tip of the catheter in the proximal right atrium.  Plan: Catheter ready for use.  See detailed procedure note with images in PACS. The patient tolerated the procedure well without incident or complication and was returned to Recovery in stable condition.    Roanna Banning, MD [facility] Radiology   Pager. [phone] Clinic. [phone]

## 2022-12-15 NOTE — Significant Event (Signed)
Rapid Response Event Note   Reason for Call :  Hypotensive 84/62 during initial 15 minutes of dialysis with decreased LOC   Initial Focused Assessment:  Upon entering dialysis room [facility] MD, Respiratory, Lab, ICU Charge, ED RN, and pt primary RN in room. Pt already placed in trendelenburg on O2 [place] 6L/min, Still on telemetry from the floor showing HR in the 80s and BP had come up. (See VS in flowsheets). Per dialysis RN pt had been on dialysis for about 15 minutes with a starting BP of 131/72 that then dropped to 84/62 at which point she called a rapid. With interventions patient returned to baseline. MD updated family who was in the hall and advised that patient could resume dialysis. Pt oxygen was weaned down and pt responsive to staff.   Interventions:  Pt was given albumin by dialysis RN and 500 saline bolus.   Plan of Care:  Resume dialysis and return room.   Event Follow up: This RN Checked in on patient multiple times during her continued time at dialysis. Pt was free from any additional events.   MD Notified: 1651 Call Time:1651 Arrival Time:1655 End Time:1725  Marvis Repress, RN

## 2022-12-16 DIAGNOSIS — J9601 Acute respiratory failure with hypoxia: Secondary | ICD-10-CM | POA: Diagnosis not present

## 2022-12-16 DIAGNOSIS — J189 Pneumonia, unspecified organism: Secondary | ICD-10-CM | POA: Diagnosis not present

## 2022-12-16 DIAGNOSIS — E782 Mixed hyperlipidemia: Secondary | ICD-10-CM | POA: Diagnosis not present

## 2022-12-16 DIAGNOSIS — S81802D Unspecified open wound, left lower leg, subsequent encounter: Secondary | ICD-10-CM | POA: Diagnosis not present

## 2022-12-16 LAB — CBC
HCT: 27.3 % — ABNORMAL LOW (ref 36.0–46.0)
Hemoglobin: 8.7 g/dL — ABNORMAL LOW (ref 12.0–15.0)
MCH: 28.6 pg (ref 26.0–34.0)
MCHC: 31.9 g/dL (ref 30.0–36.0)
MCV: 89.8 fL (ref 80.0–100.0)
Platelets: 268 10*3/uL (ref 150–400)
RBC: 3.04 MIL/uL — ABNORMAL LOW (ref 3.87–5.11)
RDW: 15.9 % — ABNORMAL HIGH (ref 11.5–15.5)
WBC: 13 10*3/uL — ABNORMAL HIGH (ref 4.0–10.5)
nRBC: 0.2 % (ref 0.0–0.2)

## 2022-12-16 LAB — BPAM RBC
Blood Product Expiration Date: 202410122359
ISSUE DATE / TIME: 202409061824
Unit Type and Rh: 1700

## 2022-12-16 LAB — RENAL FUNCTION PANEL
Albumin: 3 g/dL — ABNORMAL LOW (ref 3.5–5.0)
Anion gap: 10 (ref 5–15)
BUN: 23 mg/dL (ref 8–23)
CO2: 29 mmol/L (ref 22–32)
Calcium: 8.3 mg/dL — ABNORMAL LOW (ref 8.9–10.3)
Chloride: 97 mmol/L — ABNORMAL LOW (ref 98–111)
Creatinine, Ser: 2.8 mg/dL — ABNORMAL HIGH (ref 0.44–1.00)
GFR, Estimated: 16 mL/min — ABNORMAL LOW (ref >60)
Glucose, Bld: 96 mg/dL (ref 70–99)
Phosphorus: 2.9 mg/dL (ref 2.5–4.6)
Potassium: 3.8 mmol/L (ref 3.5–5.1)
Sodium: 136 mmol/L (ref 135–145)

## 2022-12-16 LAB — GLUCOSE, CAPILLARY
Glucose-Capillary: 100 mg/dL — ABNORMAL HIGH (ref 70–99)
Glucose-Capillary: 100 mg/dL — ABNORMAL HIGH (ref 70–99)
Glucose-Capillary: 132 mg/dL — ABNORMAL HIGH (ref 70–99)
Glucose-Capillary: 160 mg/dL — ABNORMAL HIGH (ref 70–99)
Glucose-Capillary: 164 mg/dL — ABNORMAL HIGH (ref 70–99)
Glucose-Capillary: 90 mg/dL (ref 70–99)
Glucose-Capillary: 99 mg/dL (ref 70–99)

## 2022-12-16 LAB — TYPE AND SCREEN
ABO/RH(D): B POS
Antibody Screen: NEGATIVE
Unit division: 0

## 2022-12-16 MED ORDER — ISOSORBIDE MONONITRATE ER 60 MG PO TB24
30.0000 mg | ORAL_TABLET | Freq: Every day | ORAL | Status: DC
  Administered 2022-12-17: 30 mg via ORAL
  Filled 2022-12-16 (×2): qty 1

## 2022-12-16 MED ORDER — HYDRALAZINE HCL 20 MG/ML IJ SOLN: 10.0000 mg | Freq: Three times a day (TID) | INTRAMUSCULAR | Status: DC | PRN

## 2022-12-16 MED ORDER — HYDROXYZINE HCL 10 MG PO TABS
10.0000 mg | ORAL_TABLET | Freq: Three times a day (TID) | ORAL | Status: DC
  Administered 2022-12-16 – 2022-12-18 (×5): 10 mg via ORAL
  Filled 2022-12-16 (×6): qty 1

## 2022-12-16 MED ORDER — APIXABAN 2.5 MG PO TABS
2.5000 mg | ORAL_TABLET | Freq: Two times a day (BID) | ORAL | Status: DC
  Administered 2022-12-16 – 2022-12-18 (×4): 2.5 mg via ORAL
  Filled 2022-12-16 (×4): qty 1

## 2022-12-16 MED ORDER — HYDRALAZINE HCL 25 MG PO TABS
25.0000 mg | ORAL_TABLET | Freq: Two times a day (BID) | ORAL | Status: DC
  Administered 2022-12-16 – 2022-12-17 (×3): 25 mg via ORAL
  Filled 2022-12-16 (×4): qty 1

## 2022-12-16 NOTE — Progress Notes (Signed)
PROGRESS NOTE    [name] [name]  NGE:[id number] DOB: 1941/04/05 DOA: 11/27/2022 PCP: Dorothyann Peng, MD   Brief Narrative:    [name] [name] is a 82 y.o. female with medical history significant for HFpEF, type 2 diabetes insulin-dependent, CKD stage IV, hypertension, dyslipidemia, chronic anemia of CKD, and prior CVA who presented to the ED with worsening shortness of breath as well as dry cough that began last night.  Patient was admitted with acute hypoxemic respiratory failure-multifactorial in the setting of volume overload as well as suspected pneumonia.  She is also noted to have worsening AKI on CKD stage IV and nephrology recommending hemodialysis, family now appears agreeable.  She is noted to have pleural effusion and will undergo thoracentesis today.  Anticipate discharge to skilled nursing facility pending bed availability in the next 24-48 hours.  Assessment & Plan:   Principal Problem:   Acute hypoxemic respiratory failure (HCC) Active Problems:   Chronic kidney disease (CKD), stage IV (severe) (HCC)   Essential hypertension   Mixed hyperlipidemia   Anemia due to chronic kidney disease   Diabetic retinopathy associated with type 2 diabetes mellitus (HCC)   Wound of left leg   End stage renal disease (HCC)   Protein-calorie malnutrition, severe  Assessment and Plan:  Acute hypoxemic respiratory failure-likely related to possible pneumonia -Off BiPAP; status post thoracentesis. -Left lower lobe atelectasis versus effusion with possible pneumonia. -no signs of acute infection currently. -Scheduled breathing treatments, flutter valve and incentive spirometry has been encouraged. -Continue to wean off oxygen supplementation as needed. Down to 1 L [place] -Antibiotics therapy has been completed.  Patient is overall stable.   AKI on CKD stage IV to V with metabolic acidosis Poor oral intake over the last 1 week noted Family now appears agreeable for hemodialysis and this  will be initiated per nephrology when appropriate -Status post dialysis catheter placement; and back-to-back initiation treatments (8/29>>8/30>>8/31); social worker aware to assist with outpatient dialysis chair.  -Patient so far has tolerated dialysis treatment; following available spot in the outpatient setting (At [facility] dialysis Tuesday-Thursday and Saturday); patient will mimic that schedule while in house. -Status post dialysis catheter replacement on 12/15/2022 with subsequent dialysis treatment that day.  No ultrafiltration provided as patient experienced episode of hypotension. -Per nephrology service will hold on dialysis today.  There is no acute need for dialysis treatment. -Continue to closely follow daily weights, volume status and response to HD. -Continue to follow nephrology service recommendation; appreciate their assistance in care.   Left lower extremity cellulitis/wound -Acute process resolved; patient has completed antibiotic therapy -Continue wound care treatment as recommended by wound care service. -DVT evaluation negative.   Elevated BNP -Does not appear to be related to acute CHF -Likely elevated in the setting of AKI on CKD stage IV-V patient a transition point currently based on GFR. -Continue to follow daily weights and strict I's and O's. -At the moment will continue volume management with dialysis treatments. -Continue low-sodium diet.   Anemia of CKD -Receives Retacrit injections outpatient with last dose 8/5 -No overt bleeding appreciated. -Continue to monitor with repeat CBC, no overt bleeding noted -Further treatment with Epogen and IV iron as per nephrology discretion. -Continue to follow hemoglobin trend/stability. -No overt bleeding reported or appreciated. -In the event of hypotension on 12/15/2022 with borderline low hemoglobin at 7.0, 1 unit PRBCs was transfused. -Continue to follow hemoglobin trend.  Currently 8.7   DM2 with mild  hyperglycemia/hypoglycemia -Continue on carb modified diet -Continue  to follow CBGs fluctuation and adjust hypoglycemic regimen as needed. -Now the patient has been started on dialysis mild hypoglycemic events appreciated; insulin pump has been discontinue and the sliding scale insulin along with low-dose Semglee has been started.   Hypertension/hypertension -Continue home antihypertensive agents; medication has been adjusted following episode of hypotension on 12/15/2022 while having dialysis. -Continue as needed hydralazine -Follow vital signs -Anticipating vital signs fluctuation while receiving dialysis  Dyslipidemia -Continue atorvastatin -Heart healthy diet discussed with patient and family.   PAF -Continue Eliquis for secondary prevention. -Continue metoprolol -Normal sinus rhythm per telemetry evaluation. -Continue to follow electrolytes. -Planning to resume the use of Eliquis today (12/16/2022).   HFpEF Continue metoprolol Repeat 2D echocardiogram with previous 03/2021 with LVEF 65-70% with grade 1 diastolic dysfunction and moderate LVH Noted to have elevated BNP, but more likely related to significant renal dysfunction. -At this moment after discussing with nephrologist and following further deterioration in her renal function plan is to initiate dialysis -Dialysis catheter will be replaced by IR on 12/15/2022 with show hemodialysis without ultrafiltration on 12/15/2022.  Hypokalemia -Continue to have electrolytes repleted throughout hemodialysis -Potassium currently stable. -No abnormalities appreciated on telemetry.  Continue monitoring electrolyte trend  Presumed dysphagia -Speech therapy has been consulted and and so far recommending regular diet and thin liquids.   DVT prophylaxis: Continue Eliquis. Code Status: Full Family Communication: daughter and sister at bedside (12/16/2022).   Disposition Plan:  Status is: Inpatient  Remains inpatient appropriate because: Need  for IV antibiotics and inpatient procedures.  Consultants:  Nephrology Palliative care  Procedures:  Ultrasound thoracentesis pending 8/27  Antimicrobials:  Anti-infectives (From admission, onward)    Start     Dose/Rate Route Frequency Ordered Stop   12/13/22 1600  cefdinir (OMNICEF) capsule 300 mg        300 mg Oral Every 24 hours 12/13/22 1445 12/13/22 1717   12/12/22 2200  doxycycline (VIBRA-TABS) tablet 100 mg        100 mg Oral Every 12 hours 12/12/22 0930 12/13/22 2204   12/12/22 1500  cefTRIAXone (ROCEPHIN) 1 g in sodium chloride 0.9 % 100 mL IVPB  Status:  Discontinued        1 g 200 mL/hr over 30 Minutes Intravenous Every 24 hours 12/12/22 0930 12/13/22 1445   11/29/2022 1045  ceFAZolin (ANCEF) IVPB 2g/100 mL premix  Status:  Discontinued        2 g 200 mL/hr over 30 Minutes Intravenous On call to O.R. 11/16/2022 1610 11/15/2022 1333   12/05/22 2200  doxycycline (VIBRAMYCIN) 100 mg in sodium chloride 0.9 % 250 mL IVPB  Status:  Discontinued        100 mg 125 mL/hr over 120 Minutes Intravenous Every 12 hours 12/09/2022 1526 12/12/22 0930   12/05/22 1500  cefTRIAXone (ROCEPHIN) 1 g in sodium chloride 0.9 % 100 mL IVPB  Status:  Discontinued        1 g 200 mL/hr over 30 Minutes Intravenous Every 24 hours 11/28/2022 1526 12/12/22 0930   11/09/2022 1345  cefTRIAXone (ROCEPHIN) 1 g in sodium chloride 0.9 % 100 mL IVPB        1 g 200 mL/hr over 30 Minutes Intravenous  Once 11/14/2022 1335 11/19/2022 1537   12/01/2022 1345  doxycycline (VIBRA-TABS) tablet 100 mg        100 mg Oral  Once 11/11/2022 1335 11/11/2022 1507      Subjective: Chronically ill in appearance and deconditioned; patient was seated in bedside chair in  the intensive and following commands at time of examination.  No chest pain, no nausea, no vomiting.  Yesterday evening while having dialysis experience episode of rapid response in the setting of hypotension.  Patient received albumin, blood transfusion and 500 mL bolus with  stabilization on her vital signs.  Objective: Vitals:   12/16/22 0410 12/16/22 0800 12/16/22 0900 12/16/22 1556  BP: (!) 144/55  107/83 (!) 154/74  Pulse: 85  90 81  Resp:    19  Temp: 99 F (37.2 C)   98.3 F (36.8 C)  TempSrc:    Oral  SpO2: 91% 93% 93% 93%  Weight:      Height:        Intake/Output Summary (Last 24 hours) at 12/16/2022 1630 Last data filed at 12/16/2022 0900 Gross per 24 hour  Intake 325 ml  Output 0 ml  Net 325 ml   Filed Weights   12/12/22 0500 12/12/22 1120 12/13/22 0500  Weight: 71.8 kg 71.8 kg 69.5 kg    Examination: General exam: Alert, awake, oriented x2; following commands and in no acute distress.  Blood pressure has stabilized and the patient expressed no nausea, no vomiting, no chest pain and no significant difficulty breathing.  Chronically ill and deconditioned. Respiratory system: No wheezing, no crackles, no using accessory muscle.  1 L nasal cannula in place with saturation in the mid 90s. Cardiovascular system: Rate controlled, no rubs, no gallops, no JVD. Gastrointestinal system: Abdomen is nondistended, soft and nontender. No organomegaly or masses felt. Normal bowel sounds heard. Central nervous system: Alert and oriented. No focal neurological deficits. Extremities: No cyanosis, clubbing or edema.  Left lower extremity with clean dressings in place. Skin: No petechiae; left anterior shin with cellulitic process that has completely resolved; scab in anterior wound without drainage or signs of superimposed infection. Psychiatry: Judgement and insight appear stable.  Stable mood.  Data Reviewed: I have personally reviewed following labs and imaging studies  CBC: Recent Labs  Lab 12/12/22 1405 12/13/22 0500 12/15/22 1637 12/16/22 0717  WBC 13.8* 11.4* 14.6* 13.0*  HGB 8.0* 7.7* 7.0* 8.7*  HCT 24.5* 23.7* 21.5* 27.3*  MCV 87.8 89.8 90.0 89.8  PLT 474* 406* 384 268   Basic Metabolic Panel: Recent Labs  Lab 12/12/22 0609  12/13/22 0500 12/14/22 0453 12/15/22 0412 12/16/22 0447  NA 135 135 136 136 136  K 4.1 4.1 3.9 3.2* 3.8  CL 97* 95* 97* 96* 97*  CO2 21* 21* 21* 24 29  GLUCOSE 208* 233* 218* 226* 96  BUN 52* 29* 42* 48* 23  CREATININE 5.05* 3.23* 4.07* 4.90* 2.80*  CALCIUM 8.5* 8.3* 8.4* 8.5* 8.3*  PHOS 4.9* 3.5 3.7 3.6 2.9   GFR: Estimated Creatinine Clearance: 13.7 mL/min (A) (by C-G formula based on SCr of 2.8 mg/dL (H)).  CBG: Recent Labs  Lab 12/16/22 0306 12/16/22 0414 12/16/22 0726 12/16/22 1108 12/16/22 1625  GLUCAP 100* 90 99 160* 100*    Recent Results (from the past 240 hour(s))  Surgical PCR screen     Status: None   Collection Time: 11/12/2022  3:07 AM   Specimen: Nasal Mucosa; Nasal Swab  Result Value Ref Range Status   MRSA, PCR NEGATIVE NEGATIVE Final   Staphylococcus aureus NEGATIVE NEGATIVE Final    Comment: (NOTE) The Xpert SA Assay (FDA approved for NASAL specimens in patients 31 years of age and older), is one component of a comprehensive surveillance program. It is not intended to diagnose infection nor to guide or  monitor treatment. Performed at [facility], [address], [place], [place] [address]     Radiology Studies: IR Fluoro Guide CV Line Right  Result Date: 12/15/2022 INDICATION: ESRD requiring HD. Prior dialysis catheter placed by surgery service, inadvertently removed. EXAM: TUNNELED CENTRAL VENOUS HEMODIALYSIS CATHETER PLACEMENT WITH ULTRASOUND AND FLUOROSCOPIC GUIDANCE MEDICATIONS: Ancef 2 gm IV . The antibiotic was given in an appropriate time interval prior to skin puncture. ANESTHESIA/SEDATION: Local anesthetic was employed during this procedure. A total of Benadryl 25 mg was administered intravenously. The patient's level of consciousness and vital signs were monitored continuously by radiology nursing throughout the procedure under my direct supervision. FLUOROSCOPY TIME:  Fluoroscopic dose; 2 mGy COMPLICATIONS: None immediate. PROCEDURE: Informed  written consent was obtained from the patient and/or patient's representative after a discussion of the risks, benefits, and alternatives to treatment. Questions regarding the procedure were encouraged and answered. The RIGHT neck and chest were prepped with chlorhexidine in a sterile fashion, and a sterile drape was applied covering the operative field. Maximum barrier sterile technique with sterile gowns and gloves were used for the procedure. A timeout was performed prior to the initiation of the procedure. After creating a small venotomy incision, a micropuncture kit was utilized to access the internal jugular vein. Real-time ultrasound guidance was utilized for vascular access including the acquisition of a permanent ultrasound image documenting patency of the accessed vessel. The microwire was utilized to measure appropriate catheter length. A stiff Glidewire was advanced to the level of the IVC and the micropuncture sheath was exchanged for a peel-away sheath. A palindrome tunneled hemodialysis catheter measuring 19 cm from tip to cuff was tunneled in a retrograde fashion from the anterior chest wall to the venotomy incision. The catheter was then placed through the peel-away sheath with tips ultimately positioned within the superior aspect of the right atrium. Final catheter positioning was confirmed and documented with a spot radiographic image. The catheter aspirates and flushes normally. The catheter was flushed with appropriate volume heparin dwells. The catheter exit site was secured with a 2-0 Ethilon retention suture. The venotomy incision was closed with Dermabond. Dressings were applied. The patient tolerated the procedure well without immediate post procedural complication. IMPRESSION: Successful placement of 19 cm tip to cuff tunneled hemodialysis catheter via the RIGHT internal jugular vein The tip of the catheter is positioned within the proximal RIGHT atrium. The catheter is ready for immediate  use. Roanna Banning, MD [facility] Radiology Electronically Signed   By: Roanna Banning M.D.   On: 12/15/2022 16:08   IR US Guide Vasc Access Right  Result Date: 12/15/2022 INDICATION: ESRD requiring HD. Prior dialysis catheter placed by surgery service, inadvertently removed. EXAM: TUNNELED CENTRAL VENOUS HEMODIALYSIS CATHETER PLACEMENT WITH ULTRASOUND AND FLUOROSCOPIC GUIDANCE MEDICATIONS: Ancef 2 gm IV . The antibiotic was given in an appropriate time interval prior to skin puncture. ANESTHESIA/SEDATION: Local anesthetic was employed during this procedure. A total of Benadryl 25 mg was administered intravenously. The patient's level of consciousness and vital signs were monitored continuously by radiology nursing throughout the procedure under my direct supervision. FLUOROSCOPY TIME:  Fluoroscopic dose; 2 mGy COMPLICATIONS: None immediate. PROCEDURE: Informed written consent was obtained from the patient and/or patient's representative after a discussion of the risks, benefits, and alternatives to treatment. Questions regarding the procedure were encouraged and answered. The RIGHT neck and chest were prepped with chlorhexidine in a sterile fashion, and a sterile drape was applied covering the operative field. Maximum barrier  sterile technique with sterile gowns and gloves were used for the procedure. A timeout was performed prior to the initiation of the procedure. After creating a small venotomy incision, a micropuncture kit was utilized to access the internal jugular vein. Real-time ultrasound guidance was utilized for vascular access including the acquisition of a permanent ultrasound image documenting patency of the accessed vessel. The microwire was utilized to measure appropriate catheter length. A stiff Glidewire was advanced to the level of the IVC and the micropuncture sheath was exchanged for a peel-away sheath. A palindrome tunneled hemodialysis catheter  measuring 19 cm from tip to cuff was tunneled in a retrograde fashion from the anterior chest wall to the venotomy incision. The catheter was then placed through the peel-away sheath with tips ultimately positioned within the superior aspect of the right atrium. Final catheter positioning was confirmed and documented with a spot radiographic image. The catheter aspirates and flushes normally. The catheter was flushed with appropriate volume heparin dwells. The catheter exit site was secured with a 2-0 Ethilon retention suture. The venotomy incision was closed with Dermabond. Dressings were applied. The patient tolerated the procedure well without immediate post procedural complication. IMPRESSION: Successful placement of 19 cm tip to cuff tunneled hemodialysis catheter via the RIGHT internal jugular vein The tip of the catheter is positioned within the proximal RIGHT atrium. The catheter is ready for immediate use. Roanna Banning, MD [facility] Radiology Electronically Signed   By: Roanna Banning M.D.   On: 12/15/2022 16:08    Scheduled Meds:  atorvastatin  20 mg Oral Daily   budesonide (PULMICORT) nebulizer solution  0.25 mg Nebulization BID   Chlorhexidine Gluconate Cloth  6 each Topical Q0600   darbepoetin (ARANESP) injection - DIALYSIS  60 mcg Subcutaneous Q Tue-1800   famotidine  20 mg Oral QHS   feeding supplement (NEPRO CARB STEADY)  237 mL Oral TID BM   fluticasone  1 spray Each Nare Daily   hydrALAZINE  25 mg Oral BID   hydrOXYzine  10 mg Oral TID   influenza vaccine adjuvanted  0.5 mL Intramuscular Tomorrow-1000   insulin aspart  0-6 Units Subcutaneous TID WC   insulin glargine-yfgn  5 Units Subcutaneous Daily   isosorbide mononitrate  30 mg Oral Daily   latanoprost  1 drop Both Eyes QHS   loratadine  10 mg Oral Daily   metoprolol succinate  12.5 mg Oral Daily   multivitamin  1 tablet Oral QHS   Continuous Infusions:     LOS: 12 days    Time spent: 55 minutes  Zaynab Chipman,MD [facility]  If 7PM-7AM, please contact night-coverage [email or web address] 12/16/2022, 4:30 PM

## 2022-12-16 NOTE — Progress Notes (Signed)
Patient ID: [name] [name], female   DOB: 11/02/1940, 82 y.o.   MRN: [id number] [facility] Progress Note   Assessment/ Plan:   1.  Acute hypoxic respiratory failure: Pneumonia versus volume overload.  With some residual pleural effusion status postthoracentesis and will continue hemodialysis while here in the hospital with efforts at ultrafiltration.  Unable to tolerate UF goal yesterday because of intradialytic hypotension prompting need for albumin infusion. 2. ESRD: New start to dialysis based on directions from family after previously refusing dialysis.  Underwent TDC placement yesterday followed by dialysis that was demarcated by intradialytic hypotension.  I was asked to evaluate the patient today to see if she needed dialysis-no indications identified.  Next dialysis Monday. 3. Anemia: Status post PRBC transfusion with hemodialysis yesterday.  Continue ESA status post IV iron. 4. CKD-MBD: Calcium and phosphorus levels at goal, not on binder and nutritional intake being optimized. 5. Nutrition: With protein calorie malnutrition, continue to optimize nutritional status. 6. Hypertension: Soft blood pressures noted at this time on low-dose amlodipine/hydralazine/Imdur and metoprolol, continue to monitor with intradialytic hypotension.  Subjective:   Reports to have eaten breakfast and denies any chest pain or shortness of breath at this time.  Spoke to daughter over phone with assistance from granddaughter.   Objective:   BP 107/83   Pulse 90   Temp 99 F (37.2 C)   Resp (!) 23   Ht 5\' 2"  (1.575 m)   Wt 69.5 kg   SpO2 93%   BMI 28.02 kg/m   Physical Exam: Gen: Comfortably sitting up in recliner, on oxygen via nasal cannula CVS: Pulse regular rhythm, normal rate, S1 and S2 normal Resp: Diminished breath sounds over bases without distinct rales or rhonchi Abd: Soft, obese, nontender, bowel sounds normal Ext: Trace ankle edema  Labs: BMET Recent Labs  Lab  12/10/22 0429 12/11/22 0420 12/12/22 0609 12/13/22 0500 12/14/22 0453 12/15/22 0412 12/16/22 0447  NA 136 136 135 135 136 136 136  K 3.7 3.7 4.1 4.1 3.9 3.2* 3.8  CL 98 99 97* 95* 97* 96* 97*  CO2 27 24 21* 21* 21* 24 29  GLUCOSE 138* 177* 208* 233* 218* 226* 96  BUN 30* 42* 52* 29* 42* 48* 23  CREATININE 3.62* 4.43* 5.05* 3.23* 4.07* 4.90* 2.80*  CALCIUM 8.1* 8.5* 8.5* 8.3* 8.4* 8.5* 8.3*  PHOS 3.9 5.4* 4.9* 3.5 3.7 3.6 2.9   CBC Recent Labs  Lab 12/12/22 1405 12/13/22 0500 12/15/22 1637 12/16/22 0717  WBC 13.8* 11.4* 14.6* 13.0*  HGB 8.0* 7.7* 7.0* 8.7*  HCT 24.5* 23.7* 21.5* 27.3*  MCV 87.8 89.8 90.0 89.8  PLT 474* 406* 384 268      Medications:     atorvastatin  20 mg Oral Daily   budesonide (PULMICORT) nebulizer solution  0.25 mg Nebulization BID   Chlorhexidine Gluconate Cloth  6 each Topical Q0600   darbepoetin (ARANESP) injection - DIALYSIS  60 mcg Subcutaneous Q Tue-1800   famotidine  20 mg Oral QHS   feeding supplement (NEPRO CARB STEADY)  237 mL Oral TID BM   fluticasone  1 spray Each Nare Daily   hydrALAZINE  25 mg Oral BID   influenza vaccine adjuvanted  0.5 mL Intramuscular Tomorrow-1000   insulin aspart  0-6 Units Subcutaneous TID WC   insulin glargine-yfgn  5 Units Subcutaneous Daily   isosorbide mononitrate  30 mg Oral Daily   latanoprost  1 drop Both Eyes QHS   loratadine  10 mg Oral Daily  metoprolol succinate  12.5 mg Oral Daily   multivitamin  1 tablet Oral QHS   Zetta Bills, MD 12/16/2022, 10:29 AM

## 2022-12-16 NOTE — Progress Notes (Signed)
Patient alert with confusion, ambulated from bed to chair with assistance. Tolerated medications whole with sips of water. Had to hold several of patients medications this morning due to BP 107/83, see MAR. MD Gwenlyn Perking made aware. Patients daughter at bedside reported patient anxious requesting anxiety medication to be scheduled, patient having hot flashes and pulling off clothes. Noted patients room hot and temperature set to 80, adjusted temp in patients room. MD Gwenlyn Perking made aware. New orders placed.

## 2022-12-16 NOTE — Progress Notes (Signed)
Physical Therapy Treatment Patient Details Name: [name] [name] MRN: [id number] DOB: 07/03/1940 Today's Date: 12/16/2022   History of Present Illness per MD note today:[name] [name] is a 82 y.o. female with medical history significant for HFpEF, type 2 diabetes insulin-dependent, CKD stage IV, hypertension, dyslipidemia, chronic anemia of CKD, and prior CVA who presented to the ED with worsening shortness of breath as well as dry cough that began last night.  Patient was admitted with acute hypoxemic respiratory failure-multifactorial in the setting of volume overload as well as suspected pneumonia.  She is also noted to have worsening AKI on CKD stage IV and nephrology recommending hemodialysis, family now appears agreeable.  She is noted to have pleural effusion and will undergo thoracentesis today.  Anticipate hemodialysis initiation in the next 1-2 days.    PT Comments  Patient presents up in chair (assisted by nursing staff) and agreeable for therapy.  Patient requires active assistance for completing BLE exercises with fair carryover, able to take a few steps forward/backward at bedside before having to sit due to buckling of knees/BLE weakness and tolerated staying up in chair with family member present.  Patient will benefit from continued skilled physical therapy in hospital and recommended venue below to increase strength, balance, endurance for safe ADLs and gait.     If plan is discharge home, recommend the following: A lot of help with bathing/dressing/bathroom;A lot of help with walking and/or transfers;Help with stairs or ramp for entrance;Assistance with cooking/housework   Can travel by private vehicle     No  Equipment Recommendations  None recommended by PT    Recommendations for Other Services       Precautions / Restrictions Precautions Precautions: Fall Restrictions Weight Bearing Restrictions: No     Mobility  Bed Mobility               General bed  mobility comments: Patient presents in chair (assisted by nursing staff)    Transfers Overall transfer level: Needs assistance Equipment used: Rolling walker (2 wheels) Transfers: Sit to/from Stand, Bed to chair/wheelchair/BSC Sit to Stand: Min assist, Mod assist   Step pivot transfers: Mod assist       General transfer comment: unsteady labored movement, knees buckling once fatigued    Ambulation/Gait Ambulation/Gait assistance: Mod assist Gait Distance (Feet): 6 Feet Assistive device: Rolling walker (2 wheels) Gait Pattern/deviations: Decreased step length - right, Decreased step length - left, Decreased stride length, Trunk flexed, Knees buckling Gait velocity: slow     General Gait Details: able to take a few steps forward/backward before having to sit due weakness with buckling of knees   Stairs             Wheelchair Mobility     Tilt Bed    Modified Rankin (Stroke Patients Only)       Balance Overall balance assessment: Needs assistance Sitting-balance support: Feet supported, No upper extremity supported Sitting balance-Leahy Scale: Fair Sitting balance - Comments: fair/good seated at EOB and in chair   Standing balance support: Reliant on assistive device for balance, During functional activity, Bilateral upper extremity supported Standing balance-Leahy Scale: Poor Standing balance comment: fair/poor using RW                            Cognition Arousal: Alert   Overall Cognitive Status: Within Functional Limits for tasks assessed  General Comments: requires repeated verbal/tactile cueing for completing most task        Exercises General Exercises - Lower Extremity Ankle Circles/Pumps: Seated, AROM, Strengthening, 10 reps, AAROM Long Arc Quad: Seated, AROM, Strengthening, Both, 10 reps, AAROM Hip Flexion/Marching: Seated, AROM, Strengthening, Both, 10 reps, AAROM    General  Comments        Pertinent Vitals/Pain Pain Assessment Pain Assessment: No/denies pain    Home Living                          Prior Function            PT Goals (current goals can now be found in the care plan section) Acute Rehab PT Goals Patient Stated Goal: to be able to walk again PT Goal Formulation: With patient/family Potential to Achieve Goals: Good Progress towards PT goals: Progressing toward goals    Frequency    Min 3X/week      PT Plan      Co-evaluation              AM-PAC PT "6 Clicks" Mobility   Outcome Measure  Help needed turning from your back to your side while in a flat bed without using bedrails?: A Little Help needed moving from lying on your back to sitting on the side of a flat bed without using bedrails?: A Lot Help needed moving to and from a bed to a chair (including a wheelchair)?: A Lot Help needed standing up from a chair using your arms (e.g., wheelchair or bedside chair)?: A Lot Help needed to walk in hospital room?: A Lot Help needed climbing 3-5 steps with a railing? : Total 6 Click Score: 12    End of Session Equipment Utilized During Treatment: Oxygen Activity Tolerance: Patient tolerated treatment well;Patient limited by fatigue Patient left: in chair;with call bell/phone within reach;with family/visitor present Nurse Communication: Mobility status PT Visit Diagnosis: Unsteadiness on feet (R26.81);Other abnormalities of gait and mobility (R26.89);Muscle weakness (generalized) (M62.81)     Time: 1660-6301 PT Time Calculation (min) (ACUTE ONLY): 28 min  Charges:    $Therapeutic Exercise: 8-22 mins $Therapeutic Activity: 8-22 mins PT General Charges $$ ACUTE PT VISIT: 1 Visit                     12:50 PM, 12/16/22 Ocie Bob, MPT Physical Therapist with [facility] [phone] office [phone] mobile phone

## 2022-12-17 DIAGNOSIS — J9601 Acute respiratory failure with hypoxia: Secondary | ICD-10-CM | POA: Diagnosis not present

## 2022-12-17 LAB — GLUCOSE, CAPILLARY
Glucose-Capillary: 136 mg/dL — ABNORMAL HIGH (ref 70–99)
Glucose-Capillary: 122 mg/dL — ABNORMAL HIGH (ref 70–99)
Glucose-Capillary: 132 mg/dL — ABNORMAL HIGH (ref 70–99)
Glucose-Capillary: 198 mg/dL — ABNORMAL HIGH (ref 70–99)
Glucose-Capillary: 281 mg/dL — ABNORMAL HIGH (ref 70–99)
Glucose-Capillary: 243 mg/dL — ABNORMAL HIGH (ref 70–99)

## 2022-12-17 LAB — RENAL FUNCTION PANEL
Albumin: 2.7 g/dL — ABNORMAL LOW (ref 3.5–5.0)
Anion gap: 16 — ABNORMAL HIGH (ref 5–15)
BUN: 35 mg/dL — ABNORMAL HIGH (ref 8–23)
CO2: 24 mmol/L (ref 22–32)
Calcium: 8.5 mg/dL — ABNORMAL LOW (ref 8.9–10.3)
Chloride: 96 mmol/L — ABNORMAL LOW (ref 98–111)
Creatinine, Ser: 3.77 mg/dL — ABNORMAL HIGH (ref 0.44–1.00)
GFR, Estimated: 11 mL/min — ABNORMAL LOW (ref >60)
Glucose, Bld: 127 mg/dL — ABNORMAL HIGH (ref 70–99)
Phosphorus: 4 mg/dL (ref 2.5–4.6)
Potassium: 3.6 mmol/L (ref 3.5–5.1)
Sodium: 136 mmol/L (ref 135–145)

## 2022-12-17 NOTE — Progress Notes (Signed)
PROGRESS NOTE    [name] [name]  ZOX:[id number] DOB: 1940/09/07 DOA: 11/30/2022 PCP: Dorothyann Peng, MD   Brief Narrative:    [name] [name] is a 82 y.o. female with medical history significant for HFpEF, type 2 diabetes insulin-dependent, CKD stage IV, hypertension, dyslipidemia, chronic anemia of CKD, and prior CVA who presented to the ED with worsening shortness of breath as well as dry cough that began last night.  Patient was admitted with acute hypoxemic respiratory failure-multifactorial in the setting of volume overload as well as suspected pneumonia.  She is also noted to have worsening AKI on CKD stage IV and nephrology recommending hemodialysis, family now appears agreeable.  She is noted to have pleural effusion and will undergo thoracentesis today.  Anticipate discharge to skilled nursing facility pending bed availability and hemodialysis tolerance.  Assessment & Plan:   Principal Problem:   Acute hypoxemic respiratory failure (HCC) Active Problems:   Chronic kidney disease (CKD), stage IV (severe) (HCC)   Essential hypertension   Mixed hyperlipidemia   Anemia due to chronic kidney disease   Diabetic retinopathy associated with type 2 diabetes mellitus (HCC)   Wound of left leg   End stage renal disease (HCC)   Protein-calorie malnutrition, severe  Assessment and Plan:  Acute hypoxemic respiratory failure-likely related to possible pneumonia -Off BiPAP; status post thoracentesis. -Left lower lobe atelectasis versus effusion with possible pneumonia. -no signs of acute infection currently. -Scheduled breathing treatments, flutter valve and incentive spirometry has been encouraged. -Continue to wean off oxygen supplementation as needed. Down to 1 L [place] -Antibiotics therapy has been completed.  Patient is overall stable.   AKI on CKD stage IV to V with metabolic acidosis Poor oral intake over the last 1 week noted Family now appears agreeable for hemodialysis and  this will be initiated per nephrology when appropriate -Status post dialysis catheter placement; and back-to-back initiation treatments (8/29>>8/30>>8/31); social worker aware to assist with outpatient dialysis chair.  -Patient so far has tolerated dialysis treatment; following available spot in the outpatient setting (At [facility] dialysis Tuesday-Thursday and Saturday); patient will mimic that schedule while in house. -Status post dialysis catheter replacement on 12/15/2022 with subsequent dialysis treatment that day.  No ultrafiltration provided as patient experienced episode of hypotension. -Per nephrology service will hold on dialysis today.  There is no acute need for dialysis treatment. -Continue to closely follow daily weights, volume status and response to HD. -Continue to follow nephrology service recommendation; appreciate their assistance in care with next follow-up hemodialysis 20-Dec-2022   Left lower extremity cellulitis/wound -Acute process resolved; patient has completed antibiotic therapy -Continue wound care treatment as recommended by wound care service. -DVT evaluation negative.   Elevated BNP -Does not appear to be related to acute CHF -Likely elevated in the setting of AKI on CKD stage IV-V patient a transition point currently based on GFR. -Continue to follow daily weights and strict I's and O's. -At the moment will continue volume management with dialysis treatments. -Continue low-sodium diet.   Anemia of CKD -Receives Retacrit injections outpatient with last dose 8/5 -No overt bleeding appreciated. -Continue to monitor with repeat CBC, no overt bleeding noted -Further treatment with Epogen and IV iron as per nephrology discretion. -Continue to follow hemoglobin trend/stability. -No overt bleeding reported or appreciated. -In the event of hypotension on 12/15/2022 with borderline low hemoglobin at 7.0, 1 unit PRBCs was transfused. -Continue to follow hemoglobin trend.  Currently  8.7   DM2 with mild hyperglycemia/hypoglycemia -Continue on carb  modified diet -Continue to follow CBGs fluctuation and adjust hypoglycemic regimen as needed. -Now the patient has been started on dialysis mild hypoglycemic events appreciated; insulin pump has been discontinue and the sliding scale insulin along with low-dose Semglee has been started.   Hypertension/hypertension -Continue home antihypertensive agents; medication has been adjusted following episode of hypotension on 12/15/2022 while having dialysis. -Continue as needed hydralazine -Follow vital signs -Anticipating vital signs fluctuation while receiving dialysis  Dyslipidemia -Continue atorvastatin -Heart healthy diet discussed with patient and family.   PAF -Continue Eliquis for secondary prevention. -Continue metoprolol -Normal sinus rhythm per telemetry evaluation. -Continue to follow electrolytes. -Planning to resume the use of Eliquis today (12/16/2022).   HFpEF Continue metoprolol Repeat 2D echocardiogram with previous 03/2021 with LVEF 65-70% with grade 1 diastolic dysfunction and moderate LVH Noted to have elevated BNP, but more likely related to significant renal dysfunction. -At this moment after discussing with nephrologist and following further deterioration in her renal function plan is to initiate dialysis -Dialysis catheter will be replaced by IR on 12/15/2022 with show hemodialysis without ultrafiltration on 12/15/2022.  Presumed dysphagia -Speech therapy has been consulted and and so far recommending regular diet and thin liquids.   DVT prophylaxis: Continue Eliquis. Code Status: Full Family Communication: daughter and sister at bedside (12/16/2022).   Disposition Plan:  Status is: Inpatient  Remains inpatient appropriate because: Need for IV antibiotics and inpatient procedures.  Consultants:  Nephrology Palliative care  Procedures:  Ultrasound thoracentesis pending 8/27  Antimicrobials:   Anti-infectives (From admission, onward)    Start     Dose/Rate Route Frequency Ordered Stop   12/13/22 1600  cefdinir (OMNICEF) capsule 300 mg        300 mg Oral Every 24 hours 12/13/22 1445 12/13/22 1717   12/12/22 2200  doxycycline (VIBRA-TABS) tablet 100 mg        100 mg Oral Every 12 hours 12/12/22 0930 12/13/22 2204   12/12/22 1500  cefTRIAXone (ROCEPHIN) 1 g in sodium chloride 0.9 % 100 mL IVPB  Status:  Discontinued        1 g 200 mL/hr over 30 Minutes Intravenous Every 24 hours 12/12/22 0930 12/13/22 1445   11/27/2022 1045  ceFAZolin (ANCEF) IVPB 2g/100 mL premix  Status:  Discontinued        2 g 200 mL/hr over 30 Minutes Intravenous On call to O.R. 12/02/2022 4098 11/17/2022 1333   12/05/22 2200  doxycycline (VIBRAMYCIN) 100 mg in sodium chloride 0.9 % 250 mL IVPB  Status:  Discontinued        100 mg 125 mL/hr over 120 Minutes Intravenous Every 12 hours 11/18/2022 1526 12/12/22 0930   12/05/22 1500  cefTRIAXone (ROCEPHIN) 1 g in sodium chloride 0.9 % 100 mL IVPB  Status:  Discontinued        1 g 200 mL/hr over 30 Minutes Intravenous Every 24 hours 11/16/2022 1526 12/12/22 0930   11/27/2022 1345  cefTRIAXone (ROCEPHIN) 1 g in sodium chloride 0.9 % 100 mL IVPB        1 g 200 mL/hr over 30 Minutes Intravenous  Once 12/03/2022 1335 12/03/2022 1537   11/15/2022 1345  doxycycline (VIBRA-TABS) tablet 100 mg        100 mg Oral  Once 11/12/2022 1335 11/19/2022 1507      Subjective: Patient seen and evaluated at bedside with no acute overnight events noted.  Hemodialysis planned for tomorrow a.m. per nephrology.  Objective: Vitals:   12/16/22 0900 12/16/22 1556 12/16/22 2046 12/17/22  0400  BP: 107/83 (!) 154/74 (!) 142/68 (!) 151/91  Pulse: 90 81 83 73  Resp:  19 20 19   Temp:  98.3 F (36.8 C) 98.3 F (36.8 C) (!) 97.3 F (36.3 C)  TempSrc:  Oral  Oral  SpO2: 93% 93% 96% (!) 88%  Weight:      Height:        Intake/Output Summary (Last 24 hours) at 12/17/2022 0802 Last data filed at 12/17/2022  0430 Gross per 24 hour  Intake 370 ml  Output --  Net 370 ml   Filed Weights   12/12/22 0500 12/12/22 1120 12/13/22 0500  Weight: 71.8 kg 71.8 kg 69.5 kg    Examination: General exam: Alert, awake, oriented x2; following commands and in no acute distress.  Blood pressure has stabilized and the patient expressed no nausea, no vomiting, no chest pain and no significant difficulty breathing.  Chronically ill and deconditioned. Respiratory system: No wheezing, no crackles, no using accessory muscle.  1 L nasal cannula in place with saturation in the mid 90s. Cardiovascular system: Rate controlled, no rubs, no gallops, no JVD. Gastrointestinal system: Abdomen is nondistended, soft and nontender. No organomegaly or masses felt. Normal bowel sounds heard. Central nervous system: Alert and oriented. No focal neurological deficits. Extremities: No cyanosis, clubbing or edema.  Left lower extremity with clean dressings in place. Skin: No petechiae; left anterior shin with cellulitic process that has completely resolved; scab in anterior wound without drainage or signs of superimposed infection. Psychiatry: Judgement and insight appear stable.  Stable mood.  Data Reviewed: I have personally reviewed following labs and imaging studies  CBC: Recent Labs  Lab 12/12/22 1405 12/13/22 0500 12/15/22 1637 12/16/22 0717  WBC 13.8* 11.4* 14.6* 13.0*  HGB 8.0* 7.7* 7.0* 8.7*  HCT 24.5* 23.7* 21.5* 27.3*  MCV 87.8 89.8 90.0 89.8  PLT 474* 406* 384 268   Basic Metabolic Panel: Recent Labs  Lab 12/13/22 0500 12/14/22 0453 12/15/22 0412 12/16/22 0447 12/17/22 0424  NA 135 136 136 136 136  K 4.1 3.9 3.2* 3.8 3.6  CL 95* 97* 96* 97* 96*  CO2 21* 21* 24 29 24   GLUCOSE 233* 218* 226* 96 127*  BUN 29* 42* 48* 23 35*  CREATININE 3.23* 4.07* 4.90* 2.80* 3.77*  CALCIUM 8.3* 8.4* 8.5* 8.3* 8.5*  PHOS 3.5 3.7 3.6 2.9 4.0   GFR: Estimated Creatinine Clearance: 10.2 mL/min (A) (by C-G formula based  on SCr of 3.77 mg/dL (H)).  CBG: Recent Labs  Lab 12/16/22 1625 12/16/22 2049 12/16/22 2336 12/17/22 0403 12/17/22 0756  GLUCAP 100* 164* 132* 136* 122*    No results found for this or any previous visit (from the past 240 hour(s)).   Radiology Studies: IR Fluoro Guide CV Line Right  Result Date: 12/15/2022 INDICATION: ESRD requiring HD. Prior dialysis catheter placed by surgery service, inadvertently removed. EXAM: TUNNELED CENTRAL VENOUS HEMODIALYSIS CATHETER PLACEMENT WITH ULTRASOUND AND FLUOROSCOPIC GUIDANCE MEDICATIONS: Ancef 2 gm IV . The antibiotic was given in an appropriate time interval prior to skin puncture. ANESTHESIA/SEDATION: Local anesthetic was employed during this procedure. A total of Benadryl 25 mg was administered intravenously. The patient's level of consciousness and vital signs were monitored continuously by radiology nursing throughout the procedure under my direct supervision. FLUOROSCOPY TIME:  Fluoroscopic dose; 2 mGy COMPLICATIONS: None immediate. PROCEDURE: Informed written consent was obtained from the patient and/or patient's representative after a discussion of the risks, benefits, and alternatives to treatment. Questions regarding the procedure were encouraged  and answered. The RIGHT neck and chest were prepped with chlorhexidine in a sterile fashion, and a sterile drape was applied covering the operative field. Maximum barrier sterile technique with sterile gowns and gloves were used for the procedure. A timeout was performed prior to the initiation of the procedure. After creating a small venotomy incision, a micropuncture kit was utilized to access the internal jugular vein. Real-time ultrasound guidance was utilized for vascular access including the acquisition of a permanent ultrasound image documenting patency of the accessed vessel. The microwire was utilized to measure appropriate catheter length. A stiff Glidewire was advanced to the level of the IVC and  the micropuncture sheath was exchanged for a peel-away sheath. A palindrome tunneled hemodialysis catheter measuring 19 cm from tip to cuff was tunneled in a retrograde fashion from the anterior chest wall to the venotomy incision. The catheter was then placed through the peel-away sheath with tips ultimately positioned within the superior aspect of the right atrium. Final catheter positioning was confirmed and documented with a spot radiographic image. The catheter aspirates and flushes normally. The catheter was flushed with appropriate volume heparin dwells. The catheter exit site was secured with a 2-0 Ethilon retention suture. The venotomy incision was closed with Dermabond. Dressings were applied. The patient tolerated the procedure well without immediate post procedural complication. IMPRESSION: Successful placement of 19 cm tip to cuff tunneled hemodialysis catheter via the RIGHT internal jugular vein The tip of the catheter is positioned within the proximal RIGHT atrium. The catheter is ready for immediate use. Roanna Banning, MD [facility] Radiology Electronically Signed   By: Roanna Banning M.D.   On: 12/15/2022 16:08   IR US Guide Vasc Access Right  Result Date: 12/15/2022 INDICATION: ESRD requiring HD. Prior dialysis catheter placed by surgery service, inadvertently removed. EXAM: TUNNELED CENTRAL VENOUS HEMODIALYSIS CATHETER PLACEMENT WITH ULTRASOUND AND FLUOROSCOPIC GUIDANCE MEDICATIONS: Ancef 2 gm IV . The antibiotic was given in an appropriate time interval prior to skin puncture. ANESTHESIA/SEDATION: Local anesthetic was employed during this procedure. A total of Benadryl 25 mg was administered intravenously. The patient's level of consciousness and vital signs were monitored continuously by radiology nursing throughout the procedure under my direct supervision. FLUOROSCOPY TIME:  Fluoroscopic dose; 2 mGy COMPLICATIONS: None immediate. PROCEDURE:  Informed written consent was obtained from the patient and/or patient's representative after a discussion of the risks, benefits, and alternatives to treatment. Questions regarding the procedure were encouraged and answered. The RIGHT neck and chest were prepped with chlorhexidine in a sterile fashion, and a sterile drape was applied covering the operative field. Maximum barrier sterile technique with sterile gowns and gloves were used for the procedure. A timeout was performed prior to the initiation of the procedure. After creating a small venotomy incision, a micropuncture kit was utilized to access the internal jugular vein. Real-time ultrasound guidance was utilized for vascular access including the acquisition of a permanent ultrasound image documenting patency of the accessed vessel. The microwire was utilized to measure appropriate catheter length. A stiff Glidewire was advanced to the level of the IVC and the micropuncture sheath was exchanged for a peel-away sheath. A palindrome tunneled hemodialysis catheter measuring 19 cm from tip to cuff was tunneled in a retrograde fashion from the anterior chest wall to the venotomy incision. The catheter was then placed through the peel-away sheath with tips ultimately positioned within the superior aspect of the right atrium. Final catheter positioning was confirmed and documented with a spot  radiographic image. The catheter aspirates and flushes normally. The catheter was flushed with appropriate volume heparin dwells. The catheter exit site was secured with a 2-0 Ethilon retention suture. The venotomy incision was closed with Dermabond. Dressings were applied. The patient tolerated the procedure well without immediate post procedural complication. IMPRESSION: Successful placement of 19 cm tip to cuff tunneled hemodialysis catheter via the RIGHT internal jugular vein The tip of the catheter is positioned within the proximal RIGHT atrium. The catheter is ready for  immediate use. Roanna Banning, MD [facility] Radiology Electronically Signed   By: Roanna Banning M.D.   On: 12/15/2022 16:08    Scheduled Meds:  apixaban  2.5 mg Oral BID   atorvastatin  20 mg Oral Daily   budesonide (PULMICORT) nebulizer solution  0.25 mg Nebulization BID   Chlorhexidine Gluconate Cloth  6 each Topical Q0600   darbepoetin (ARANESP) injection - DIALYSIS  60 mcg Subcutaneous Q Tue-1800   famotidine  20 mg Oral QHS   feeding supplement (NEPRO CARB STEADY)  237 mL Oral TID BM   fluticasone  1 spray Each Nare Daily   hydrALAZINE  25 mg Oral BID   hydrOXYzine  10 mg Oral TID   influenza vaccine adjuvanted  0.5 mL Intramuscular Tomorrow-1000   insulin aspart  0-6 Units Subcutaneous TID WC   insulin glargine-yfgn  5 Units Subcutaneous Daily   isosorbide mononitrate  30 mg Oral Daily   latanoprost  1 drop Both Eyes QHS   loratadine  10 mg Oral Daily   metoprolol succinate  12.5 mg Oral Daily   multivitamin  1 tablet Oral QHS       LOS: 13 days   Time spent: 55 minutes  Glennette Galster D Sheri Gatchel,DO [facility]  If 7PM-7AM, please contact night-coverage [email or web address] 12/17/2022, 8:02 AM

## 2022-12-17 NOTE — Plan of Care (Signed)

## 2022-12-18 DIAGNOSIS — J9601 Acute respiratory failure with hypoxia: Secondary | ICD-10-CM | POA: Diagnosis not present

## 2022-12-18 LAB — GLUCOSE, CAPILLARY
Glucose-Capillary: 216 mg/dL — ABNORMAL HIGH (ref 70–99)
Glucose-Capillary: 217 mg/dL — ABNORMAL HIGH (ref 70–99)
Glucose-Capillary: 266 mg/dL — ABNORMAL HIGH (ref 70–99)
Glucose-Capillary: 122 mg/dL — ABNORMAL HIGH (ref 70–99)

## 2022-12-18 LAB — RENAL FUNCTION PANEL
Albumin: 2.8 g/dL — ABNORMAL LOW (ref 3.5–5.0)
Anion gap: 16 — ABNORMAL HIGH (ref 5–15)
BUN: 48 mg/dL — ABNORMAL HIGH (ref 8–23)
CO2: 24 mmol/L (ref 22–32)
Calcium: 8.6 mg/dL — ABNORMAL LOW (ref 8.9–10.3)
Chloride: 97 mmol/L — ABNORMAL LOW (ref 98–111)
Creatinine, Ser: 4.61 mg/dL — ABNORMAL HIGH (ref 0.44–1.00)
GFR, Estimated: 9 mL/min — ABNORMAL LOW (ref >60)
Glucose, Bld: 231 mg/dL — ABNORMAL HIGH (ref 70–99)
Phosphorus: 4.6 mg/dL (ref 2.5–4.6)
Potassium: 3.6 mmol/L (ref 3.5–5.1)
Sodium: 137 mmol/L (ref 135–145)

## 2022-12-18 LAB — MAGNESIUM: Magnesium: 2.1 mg/dL (ref 1.7–2.4)

## 2022-12-18 MED ORDER — TRAZODONE HCL 50 MG PO TABS: 25.0000 mg | ORAL_TABLET | Freq: Every day | ORAL | Status: DC

## 2022-12-18 MED ORDER — HALOPERIDOL LACTATE 5 MG/ML IJ SOLN: 1.0000 mg | Freq: Four times a day (QID) | INTRAMUSCULAR | Status: DC | PRN

## 2022-12-18 MED ORDER — DARBEPOETIN ALFA 60 MCG/0.3ML IJ SOSY
60.0000 ug | PREFILLED_SYRINGE | INTRAMUSCULAR | Status: DC
  Filled 2022-12-18: qty 0.3

## 2022-12-18 MED ORDER — HEPARIN SODIUM (PORCINE) 1000 UNIT/ML IJ SOLN
INTRAMUSCULAR | Status: AC
  Filled 2022-12-18: qty 6

## 2022-12-18 NOTE — Procedures (Signed)
   HEMODIALYSIS TREATMENT NOTE (HD #6):  Pt arrived to [facility] in her bed, transported by primary nursing staff.  A/O x2.  Pt's daughter, Lamar Laundry, arrived a few minutes later.  Treatment initiation was delayed as Tablo device was in maintenance rinse cycle.  While we waited, pt repeated, "Oh no.  How long have I got to stay down here?  Lord have mercy.  Lemme up from here.  I gotta get [place] here."  Daughter, Lamar Laundry, was also present and encouraged pt to focus on baseball game on TV.  Pt was given warm blankets, ice water, and HOB was adjusted.  Pt's anxiety increased once treatment began.  She continued to verbalize discomfort but also was trying to sit up, asking for "something, anything."  Daughter was encouraging and attempted to keep pt distracted.  Pt suddenly became silent 15 minutes into treatment.  Blank stare was noted.  UF was stopped and bed was put in trendelenburg as NS bolus was given.  134/94, NSR 60s on telemetry but pt was unresponsive.  Code Blue was activated with team response within a minute.  Pt became pulseless.  CPR was initiated at 1722.  Blood was returned as emergency meds were administered by me via HD catheter--see code flowsheet.  Despite interventions pt could not recover pulse. Resuscitative efforts ceased at 1738.   Total run time: 16 minutes Net UF: +567ml  Dr. Jayme Cloud spoke with patient's daughter while pt's body was prepared for family viewing.    Mrs. Dechert was transported to 308, a larger/corner room better appointed for family gathering.  I visited briefly and was received warmly.  Family denied needs at that time.  Nephrologist Dr. Noah Charon was notified of above events.     Arman Filter, RN [facility]

## 2022-12-18 NOTE — Progress Notes (Signed)
Patient alert with confusion at the beginning of shift, noted patient yelling out and trying to get out of bed. Staff assisted patient from bed to chair, patients legs elevated and chair alarm activated. Family at bedside. Tolerated medications whole with no issues. Held patients blood pressure medication this morning due to patient having dialysis today. Dressing changed to patient left lower leg, cleaned, applied Xeroform guaze and covered with foam, patient tolerated with no complaints.

## 2022-12-18 NOTE — Inpatient Diabetes Management (Signed)
Inpatient Diabetes Program Recommendations  AACE/ADA: New Consensus Statement on Inpatient Glycemic Control   Target Ranges:  Prepandial:   less than 140 mg/dL      Peak postprandial:   less than 180 mg/dL (1-2 hours)      Critically ill patients:  140 - 180 mg/dL    Latest Reference Range & Units  04:17  07:59  Glucose-Capillary 70 - 99 mg/dL 161 (H) 096 (H)    Latest Reference Range & Units 12/17/22 07:56 12/17/22 11:51 12/17/22 16:08 12/17/22 19:47 12/17/22 23:04  Glucose-Capillary 70 - 99 mg/dL 045 (H) 409 (H) 811 (H) 281 (H) 243 (H)   Review of Glycemic Control  Diabetes history: DM2 Outpatient Diabetes medications: OmniPod insulin pump with Humalog, Libre CGM, Ozempic 1 mg Qweek Current orders for Inpatient glycemic control: Semglee 5 units daily, Novolog 0-6 units TID with meals   Inpatient Diabetes Program Recommendations:     Insulin: CBG 217 mg/dl today. Please consider increasing Semglee to 7 units daily.  Thanks, [name], RN, MSN, CDCES Diabetes Coordinator Inpatient Diabetes Program [phone] (Team Pager from 8am to 5pm)

## 2022-12-18 NOTE — Progress Notes (Signed)
Patient ID: [name] [name], female   DOB: 1940-05-09, 82 y.o.   MRN: [id number] [facility] Progress Note   Assessment/ Plan:   1.  Acute hypoxic respiratory failure: Pneumonia versus volume overload; abx completed.  With some residual pleural effusion status postthoracentesis and will continue hemodialysis while here in the hospital with efforts at ultrafiltration. 2. ESRD: New start to dialysis based on directions from family after previously refusing dialysis.  Underwent TDC placement 9/8 followed by dialysis that was demarcated by intradialytic hypotension. Plan for HD today. Anticipating HD on MWF schedule unless outpatient HD placement dictates otherwise 3. Anemia: Status post PRBC transfusion with hemodialysis Friday. Transfuse prn for hgb <7.  Continue ESA. status post IV iron. 4. CKD-MBD: Calcium and phosphorus levels at goal, not on binder; monitor for now. Will check PTH 5. Nutrition: With protein calorie malnutrition, continue to optimize nutritional status. 6. Hypertension: Had some soft blood pressures over the weekend, now on just hydralazine 25mg  BID. Will UF as tolerated  Discussed with granddaughter and RN at the bedside.  Subjective:   Patient seen and examined bedside. No acute events. On stable 3L [place]. Discussed with granddaughter at the bedside.   Objective:   BP (!) 177/95 (BP Location: Right Arm)   Pulse 85   Temp 98 F (36.7 C) (Oral)   Resp 19   Ht 5\' 2"  (1.575 m)   Wt 69.5 kg   SpO2 97%   BMI 28.02 kg/m   Physical Exam: Gen: NAD, sitting up in recliner CV: RRR Resp: decreased breath sounds bibasilar, no overt w/r/r/c, on 3LNC Abd: soft, ND Ext: trace edema b/l ankles Neuro: awake, alert Dialysis access: [facility]  Labs: BMET Recent Labs  Lab 12/12/22 0609 12/13/22 0500 12/14/22 0453 12/15/22 0412 12/16/22 0447 12/17/22 0424 12/25/2022 0455  NA 135 135 136 136 136 136 137  K 4.1 4.1 3.9 3.2* 3.8 3.6 3.6  CL 97* 95* 97* 96* 97*  96* 97*  CO2 21* 21* 21* 24 29 24 24   GLUCOSE 208* 233* 218* 226* 96 127* 231*  BUN 52* 29* 42* 48* 23 35* 48*  CREATININE 5.05* 3.23* 4.07* 4.90* 2.80* 3.77* 4.61*  CALCIUM 8.5* 8.3* 8.4* 8.5* 8.3* 8.5* 8.6*  PHOS 4.9* 3.5 3.7 3.6 2.9 4.0 4.6   CBC Recent Labs  Lab 12/12/22 1405 12/13/22 0500 12/15/22 1637 12/16/22 0717  WBC 13.8* 11.4* 14.6* 13.0*  HGB 8.0* 7.7* 7.0* 8.7*  HCT 24.5* 23.7* 21.5* 27.3*  MCV 87.8 89.8 90.0 89.8  PLT 474* 406* 384 268      Medications:     apixaban  2.5 mg Oral BID   atorvastatin  20 mg Oral Daily   budesonide (PULMICORT) nebulizer solution  0.25 mg Nebulization BID   Chlorhexidine Gluconate Cloth  6 each Topical Q0600   darbepoetin (ARANESP) injection - DIALYSIS  60 mcg Subcutaneous Q Tue-1800   famotidine  20 mg Oral QHS   feeding supplement (NEPRO CARB STEADY)  237 mL Oral TID BM   fluticasone  1 spray Each Nare Daily   hydrALAZINE  25 mg Oral BID   hydrOXYzine  10 mg Oral TID   influenza vaccine adjuvanted  0.5 mL Intramuscular Tomorrow-1000   insulin aspart  0-6 Units Subcutaneous TID WC   insulin glargine-yfgn  5 Units Subcutaneous Daily   isosorbide mononitrate  30 mg Oral Daily   latanoprost  1 drop Both Eyes QHS   loratadine  10 mg Oral Daily   metoprolol  succinate  12.5 mg Oral Daily   multivitamin  1 tablet Oral QHS   Anthony Sar, MD [facility] Dec 27, 2022, 8:26 AM

## 2022-12-18 NOTE — Progress Notes (Signed)
Responded to CODE blue while patient was in hemodialysis, daughter outside the room crying. Receiving comfort from unit 300 staff. Code team present (see Code Team and MDs note). ROSC unsuccessful pronounced at 1738. Patient brought back to room 308 for family viewing. On call chaplain notified and MD Sherryll Burger made aware.

## 2022-12-18 NOTE — Progress Notes (Signed)
-  Responded to CODE BLUE in hemodialysis unit - Patient's daughter Lamar Laundry right outside the room  crying -Dialysis RN Marylene Land Poteat-doing pulse check on patient - Patient appeared to have initially had a pulse... And sinus rhythm on monitor -Patient became asystolic without hypoxia very quickly around 1721 PM -CPR and bag valve mask respirations initiated -ED provider and the rest of the code team at bedside -- Patient never regained a pulse despite repeated doses of iv epinephrine, and single dose calcium gluconate - A.m. labs from today reviewed with a potassium of 3.6 bicarb of 24 calcium was 8.6 phosphorus was 4.6, sodium was 137 -Magnesium was 2.1  -Please see code blue log/documentation for details of resuscitative efforts - -Sadly patient never regained pulse, never regained a perfusing rhythm, remained unresponsive without spontaneous respirations - Discussed with patient's daughter Lamar Laundry outside the room -Other family members notified of inability to successfully resuscitate patient - Total critical care time 38 minutes including time spent discussing with patient's daughter   Shon Hale, MD  -

## 2022-12-18 NOTE — Progress Notes (Signed)
12/21/2022 1931  Spiritual Encounters  Type of Visit Initial  Care provided to: Family  Conversation partners present during encounter Nurse  Referral source Nurse (RN/NT/LPN)  Reason for visit Patient death  OnCall Visit Yes   Chaplain was called in to comfort and support family of Pt who unexpectedly passed. Pt's family is large and many of them are coming to the hospital to view the body and comfort one another.  Pt's pastor was here and he gathered the family, asking the chaplain to lead in prayer. Chaplain remained with family for approx 70 minutes while members came and went, providing a compassionate presence, empathetic listening, and words fo encouragement where appropriate  Chaplain services remain available by paging the on-call chaplain  Laurey Arrow, [phone].

## 2022-12-18 NOTE — Progress Notes (Signed)
PROGRESS NOTE    [name] [name]  XBM:[id number] DOB: 01-29-1941 DOA: 11/14/2022 PCP: Dorothyann Peng, MD   Brief Narrative:    [name] [name] is a 82 y.o. female with medical history significant for HFpEF, type 2 diabetes insulin-dependent, CKD stage IV, hypertension, dyslipidemia, chronic anemia of CKD, and prior CVA who presented to the ED with worsening shortness of breath as well as dry cough that began last night.  Patient was admitted with acute hypoxemic respiratory failure-multifactorial in the setting of volume overload as well as suspected pneumonia.  She is also noted to have worsening AKI on CKD stage IV and nephrology recommending hemodialysis, family now appears agreeable.  She is noted to have pleural effusion and will undergo thoracentesis today.  Anticipate discharge to skilled nursing facility pending bed availability and hemodialysis tolerance.  Assessment & Plan:   Principal Problem:   Acute hypoxemic respiratory failure (HCC) Active Problems:   Chronic kidney disease (CKD), stage IV (severe) (HCC)   Essential hypertension   Mixed hyperlipidemia   Anemia due to chronic kidney disease   Diabetic retinopathy associated with type 2 diabetes mellitus (HCC)   Wound of left leg   End stage renal disease (HCC)   Protein-calorie malnutrition, severe  Assessment and Plan:  Acute hypoxemic respiratory failure-likely related to possible pneumonia -Off BiPAP; status post thoracentesis. -Left lower lobe atelectasis versus effusion with possible pneumonia. -no signs of acute infection currently. -Scheduled breathing treatments, flutter valve and incentive spirometry has been encouraged. -Continue to wean off oxygen supplementation as needed. Down to 1 L [place] -Antibiotics therapy has been completed.  Patient is overall stable.   AKI on CKD stage IV to V with metabolic acidosis Poor oral intake over the last 1 week noted Family now appears agreeable for hemodialysis and  this will be initiated per nephrology when appropriate -Status post dialysis catheter placement; and back-to-back initiation treatments (8/29>>8/30>>8/31); social worker aware to assist with outpatient dialysis chair.  -Patient so far has tolerated dialysis treatment; following available spot in the outpatient setting (At [facility] dialysis Tuesday-Thursday and Saturday); patient will mimic that schedule while in house. -Status post dialysis catheter replacement on 12/15/2022 with subsequent dialysis treatment that day.  No ultrafiltration provided as patient experienced episode of hypotension. -Per nephrology service will hold on dialysis today.  There is no acute need for dialysis treatment. -Continue to closely follow daily weights, volume status and response to HD. -Continue to follow nephrology service recommendation; appreciate their assistance in care with further hemodialysis planned today.   Left lower extremity cellulitis/wound -Acute process resolved; patient has completed antibiotic therapy -Continue wound care treatment as recommended by wound care service. -DVT evaluation negative.   Elevated BNP -Does not appear to be related to acute CHF -Likely elevated in the setting of AKI on CKD stage IV-V patient a transition point currently based on GFR. -Continue to follow daily weights and strict I's and O's. -At the moment will continue volume management with dialysis treatments. -Continue low-sodium diet.   Anemia of CKD -Receives Retacrit injections outpatient with last dose 8/5 -No overt bleeding appreciated. -Continue to monitor with repeat CBC, no overt bleeding noted -Further treatment with Epogen and IV iron as per nephrology discretion. -Continue to follow hemoglobin trend/stability. -No overt bleeding reported or appreciated. -In the event of hypotension on 12/15/2022 with borderline low hemoglobin at 7.0, 1 unit PRBCs was transfused. -Continue to follow hemoglobin trend.   Currently 8.7   DM2 with mild hyperglycemia/hypoglycemia -Continue on carb  modified diet -Continue to follow CBGs fluctuation and adjust hypoglycemic regimen as needed. -Now the patient has been started on dialysis mild hypoglycemic events appreciated; insulin pump has been discontinue and the sliding scale insulin along with low-dose Semglee has been started.   Hypertension/hypertension -Continue home antihypertensive agents; medication has been adjusted following episode of hypotension on 12/15/2022 while having dialysis. -Continue as needed hydralazine -Follow vital signs -Anticipating vital signs fluctuation while receiving dialysis  Dyslipidemia -Continue atorvastatin -Heart healthy diet discussed with patient and family.   PAF -Continue Eliquis for secondary prevention. -Continue metoprolol -Normal sinus rhythm per telemetry evaluation. -Continue to follow electrolytes. -Planning to resume the use of Eliquis today (12/16/2022).   HFpEF Continue metoprolol Repeat 2D echocardiogram with previous 03/2021 with LVEF 65-70% with grade 1 diastolic dysfunction and moderate LVH Noted to have elevated BNP, but more likely related to significant renal dysfunction. -At this moment after discussing with nephrologist and following further deterioration in her renal function plan is to initiate dialysis -Dialysis catheter will be replaced by IR on 12/15/2022 with show hemodialysis without ultrafiltration on 12/15/2022.  Presumed dysphagia -Speech therapy has been consulted and and so far recommending regular diet and thin liquids.   DVT prophylaxis: Continue Eliquis. Code Status: Full Family Communication: daughter and sister at bedside (12/16/2022).   Disposition Plan:  Status is: Inpatient  Remains inpatient appropriate because: Need for IV antibiotics and inpatient procedures.  Consultants:  Nephrology Palliative care  Procedures:  Ultrasound thoracentesis pending  8/27  Antimicrobials:  Anti-infectives (From admission, onward)    Start     Dose/Rate Route Frequency Ordered Stop   12/13/22 1600  cefdinir (OMNICEF) capsule 300 mg        300 mg Oral Every 24 hours 12/13/22 1445 12/13/22 1717   12/12/22 2200  doxycycline (VIBRA-TABS) tablet 100 mg        100 mg Oral Every 12 hours 12/12/22 0930 12/13/22 2204   12/12/22 1500  cefTRIAXone (ROCEPHIN) 1 g in sodium chloride 0.9 % 100 mL IVPB  Status:  Discontinued        1 g 200 mL/hr over 30 Minutes Intravenous Every 24 hours 12/12/22 0930 12/13/22 1445   12/01/2022 1045  ceFAZolin (ANCEF) IVPB 2g/100 mL premix  Status:  Discontinued        2 g 200 mL/hr over 30 Minutes Intravenous On call to O.R. 11/30/2022 2130 12/09/2022 1333   12/05/22 2200  doxycycline (VIBRAMYCIN) 100 mg in sodium chloride 0.9 % 250 mL IVPB  Status:  Discontinued        100 mg 125 mL/hr over 120 Minutes Intravenous Every 12 hours 11/13/2022 1526 12/12/22 0930   12/05/22 1500  cefTRIAXone (ROCEPHIN) 1 g in sodium chloride 0.9 % 100 mL IVPB  Status:  Discontinued        1 g 200 mL/hr over 30 Minutes Intravenous Every 24 hours 11/23/2022 1526 12/12/22 0930   11/28/2022 1345  cefTRIAXone (ROCEPHIN) 1 g in sodium chloride 0.9 % 100 mL IVPB        1 g 200 mL/hr over 30 Minutes Intravenous  Once 11/16/2022 1335 11/22/2022 1537   11/23/2022 1345  doxycycline (VIBRA-TABS) tablet 100 mg        100 mg Oral  Once 11/27/2022 1335 11/09/2022 1507      Subjective: Patient seen and evaluated at bedside with some mild confusion and agitation noted this morning.  Patient did not sleep well overnight.  Hemodialysis planned for today.  Objective: Vitals:  12/17/22 1945 12/17/22 1952 12/14/2022 0420 01/01/2023 0710  BP:  (!) 174/62 (!) 177/95   Pulse:  71 85   Resp:  20 19   Temp:  98.6 F (37 C) 98 F (36.7 C)   TempSrc:  Oral Oral   SpO2: 93% 97% 95% 97%  Weight:      Height:        Intake/Output Summary (Last 24 hours) at 01/06/2023 0857 Last data filed at  12/17/2022 1857 Gross per 24 hour  Intake 480 ml  Output --  Net 480 ml   Filed Weights   12/12/22 0500 12/12/22 1120 12/13/22 0500  Weight: 71.8 kg 71.8 kg 69.5 kg    Examination: General exam: Alert, awake, oriented x2; following commands and in no acute distress.  Blood pressure has stabilized and the patient expressed no nausea, no vomiting, no chest pain and no significant difficulty breathing.  Chronically ill and deconditioned. Respiratory system: No wheezing, no crackles, no using accessory muscle.  1 L nasal cannula in place with saturation in the mid 90s. Cardiovascular system: Rate controlled, no rubs, no gallops, no JVD. Gastrointestinal system: Abdomen is nondistended, soft and nontender. No organomegaly or masses felt. Normal bowel sounds heard. Central nervous system: Alert and oriented. No focal neurological deficits. Extremities: No cyanosis, clubbing or edema.  Left lower extremity with clean dressings in place. Skin: No petechiae; left anterior shin with cellulitic process that has completely resolved; scab in anterior wound without drainage or signs of superimposed infection. Psychiatry: Judgement and insight appear stable.  Stable mood.  Data Reviewed: I have personally reviewed following labs and imaging studies  CBC: Recent Labs  Lab 12/12/22 1405 12/13/22 0500 12/15/22 1637 12/16/22 0717  WBC 13.8* 11.4* 14.6* 13.0*  HGB 8.0* 7.7* 7.0* 8.7*  HCT 24.5* 23.7* 21.5* 27.3*  MCV 87.8 89.8 90.0 89.8  PLT 474* 406* 384 268   Basic Metabolic Panel: Recent Labs  Lab 12/14/22 0453 12/15/22 0412 12/16/22 0447 12/17/22 0424 12/23/2022 0455  NA 136 136 136 136 137  K 3.9 3.2* 3.8 3.6 3.6  CL 97* 96* 97* 96* 97*  CO2 21* 24 29 24 24   GLUCOSE 218* 226* 96 127* 231*  BUN 42* 48* 23 35* 48*  CREATININE 4.07* 4.90* 2.80* 3.77* 4.61*  CALCIUM 8.4* 8.5* 8.3* 8.5* 8.6*  MG  --   --   --   --  2.1  PHOS 3.7 3.6 2.9 4.0 4.6   GFR: Estimated Creatinine Clearance:  8.3 mL/min (A) (by C-G formula based on SCr of 4.61 mg/dL (H)).  CBG: Recent Labs  Lab 12/17/22 1608 12/17/22 1947 12/17/22 2304 01/04/2023 0417 12/15/2022 0759  GLUCAP 198* 281* 243* 216* 217*    No results found for this or any previous visit (from the past 240 hour(s)).   Radiology Studies: No results found.  Scheduled Meds:  apixaban  2.5 mg Oral BID   atorvastatin  20 mg Oral Daily   budesonide (PULMICORT) nebulizer solution  0.25 mg Nebulization BID   Chlorhexidine Gluconate Cloth  6 each Topical Q0600   darbepoetin (ARANESP) injection - DIALYSIS  60 mcg Subcutaneous Q Mon-1800   famotidine  20 mg Oral QHS   feeding supplement (NEPRO CARB STEADY)  237 mL Oral TID BM   fluticasone  1 spray Each Nare Daily   hydrALAZINE  25 mg Oral BID   hydrOXYzine  10 mg Oral TID   influenza vaccine adjuvanted  0.5 mL Intramuscular Tomorrow-1000   insulin aspart  0-6 Units Subcutaneous TID WC   insulin glargine-yfgn  5 Units Subcutaneous Daily   isosorbide mononitrate  30 mg Oral Daily   latanoprost  1 drop Both Eyes QHS   loratadine  10 mg Oral Daily   metoprolol succinate  12.5 mg Oral Daily   multivitamin  1 tablet Oral QHS       LOS: 14 days   Time spent: 55 minutes  Leonard Feigel D Terin Cragle,DO [facility]  If 7PM-7AM, please contact night-coverage [email or web address] 12/21/2022, 8:57 AM

## 2022-12-18 NOTE — Progress Notes (Signed)
Physical Therapy Treatment Patient Details Name: [name] [name] MRN: [id number] DOB: 11/09/1940 Today's Date: 01/08/2023   History of Present Illness per MD note today:Lexi W Monachino is a 82 y.o. female with medical history significant for HFpEF, type 2 diabetes insulin-dependent, CKD stage IV, hypertension, dyslipidemia, chronic anemia of CKD, and prior CVA who presented to the ED with worsening shortness of breath as well as dry cough that began last night.  Patient was admitted with acute hypoxemic respiratory failure-multifactorial in the setting of volume overload as well as suspected pneumonia.  She is also noted to have worsening AKI on CKD stage IV and nephrology recommending hemodialysis, family now appears agreeable.  She is noted to have pleural effusion and will undergo thoracentesis today.  Anticipate hemodialysis initiation in the next 1-2 days.    PT Comments  Patient presents seated in chair (assisted by nursing staff) and agreeable for therapy.  Patient demonstrates fair return for completing BLE ROM/strengthening exercises requiring frequent verbal cueing and occasional tactile, slightly increased endurance/distance for taking steps forward/backwards at bedside, but limited mostly due to c/o fatigue requiring frequent rest breaks.  Patient tolerated staying up in chair after therapy with family member present.  Patient will benefit from continued skilled physical therapy in hospital and recommended venue below to increase strength, balance, endurance for safe ADLs and gait.       If plan is discharge home, recommend the following: A lot of help with bathing/dressing/bathroom;A lot of help with walking and/or transfers;Help with stairs or ramp for entrance;Assistance with cooking/housework   Can travel by private vehicle     No  Equipment Recommendations  None recommended by PT    Recommendations for Other Services       Precautions / Restrictions  Precautions Precautions: Fall Restrictions Weight Bearing Restrictions: No     Mobility  Bed Mobility               General bed mobility comments: Patient presents seated in chair (assisted by nursing staff)    Transfers Overall transfer level: Needs assistance Equipment used: Rolling walker (2 wheels) Transfers: Sit to/from Stand, Bed to chair/wheelchair/BSC Sit to Stand: Min assist   Step pivot transfers: Min assist, Mod assist       General transfer comment: increased BLE strength for sit to stands    Ambulation/Gait Ambulation/Gait assistance: Mod assist Gait Distance (Feet): 10 Feet Assistive device: Rolling walker (2 wheels) Gait Pattern/deviations: Decreased step length - right, Decreased step length - left, Decreased stride length, Knees buckling Gait velocity: slow     General Gait Details: slightly increased endurance/distance for taking steps forward/backwards at bedside, limited mostly due to fatigue   Stairs             Wheelchair Mobility     Tilt Bed    Modified Rankin (Stroke Patients Only)       Balance Overall balance assessment: Needs assistance Sitting-balance support: Feet supported, No upper extremity supported Sitting balance-Leahy Scale: Fair Sitting balance - Comments: fair/good seated at EOB and in chair   Standing balance support: Reliant on assistive device for balance, During functional activity, Bilateral upper extremity supported Standing balance-Leahy Scale: Poor Standing balance comment: fair/poor using RW                            Cognition Arousal: Alert Behavior During Therapy: WFL for tasks assessed/performed Overall Cognitive Status: Within Functional Limits for tasks assessed  Exercises General Exercises - Lower Extremity Long Arc Quad: Seated, AROM, Strengthening, Both, 10 reps, AAROM Hip Flexion/Marching: Seated, AROM,  Strengthening, Both, 10 reps, AAROM Toe Raises: Seated, AROM, Strengthening, Both, 10 reps Heel Raises: Seated, AROM, Strengthening, Both, 10 reps    General Comments        Pertinent Vitals/Pain Pain Assessment Pain Assessment: No/denies pain    Home Living                          Prior Function            PT Goals (current goals can now be found in the care plan section) Acute Rehab PT Goals Patient Stated Goal: to be able to walk again PT Goal Formulation: With patient/family Potential to Achieve Goals: Good Progress towards PT goals: Progressing toward goals    Frequency    Min 3X/week      PT Plan      Co-evaluation              AM-PAC PT "6 Clicks" Mobility   Outcome Measure  Help needed turning from your back to your side while in a flat bed without using bedrails?: A Little Help needed moving from lying on your back to sitting on the side of a flat bed without using bedrails?: A Lot Help needed moving to and from a bed to a chair (including a wheelchair)?: A Lot Help needed standing up from a chair using your arms (e.g., wheelchair or bedside chair)?: A Lot Help needed to walk in hospital room?: A Lot Help needed climbing 3-5 steps with a railing? : A Lot 6 Click Score: 13    End of Session Equipment Utilized During Treatment: Oxygen Activity Tolerance: Patient tolerated treatment well;Patient limited by fatigue Patient left: in chair;with call bell/phone within reach;with family/visitor present Nurse Communication: Mobility status PT Visit Diagnosis: Unsteadiness on feet (R26.81);Other abnormalities of gait and mobility (R26.89);Muscle weakness (generalized) (M62.81)     Time: 4782-9562 PT Time Calculation (min) (ACUTE ONLY): 26 min  Charges:    $Therapeutic Exercise: 8-22 mins $Therapeutic Activity: 8-22 mins PT General Charges $$ ACUTE PT VISIT: 1 Visit                     1:40 PM, 12/17/2022 Ocie Bob, MPT Physical  Therapist with [facility] [phone] office [phone] mobile phone

## 2022-12-19 DIAGNOSIS — J9601 Acute respiratory failure with hypoxia: Secondary | ICD-10-CM | POA: Diagnosis not present

## 2022-12-19 LAB — PARATHYROID HORMONE, INTACT (NO CA): PTH: 132 pg/mL — ABNORMAL HIGH (ref 15–65)

## 2022-12-21 NOTE — Telephone Encounter (Signed)
Will call [employer] to cancel recently approved pt assitance

## 2022-12-26 ENCOUNTER — Ambulatory Visit: Payer: Medicare Other

## 2022-12-28 ENCOUNTER — Ambulatory Visit: Payer: Medicare Other | Admitting: Internal Medicine

## 2022-12-29 IMAGING — MG DIGITAL SCREENING BILAT W/ CAD
4 series · 4 of 4 positions shown · non-contrast
Comparison: Previous exam(s).

CLINICAL DATA: Screening.

EXAM:
DIGITAL SCREENING BILATERAL MAMMOGRAM WITH CAD
TECHNIQUE: Bilateral screening digital craniocaudal and mediolateral oblique
mammograms were obtained. The images were evaluated with
computer-aided detection.

[L CC]
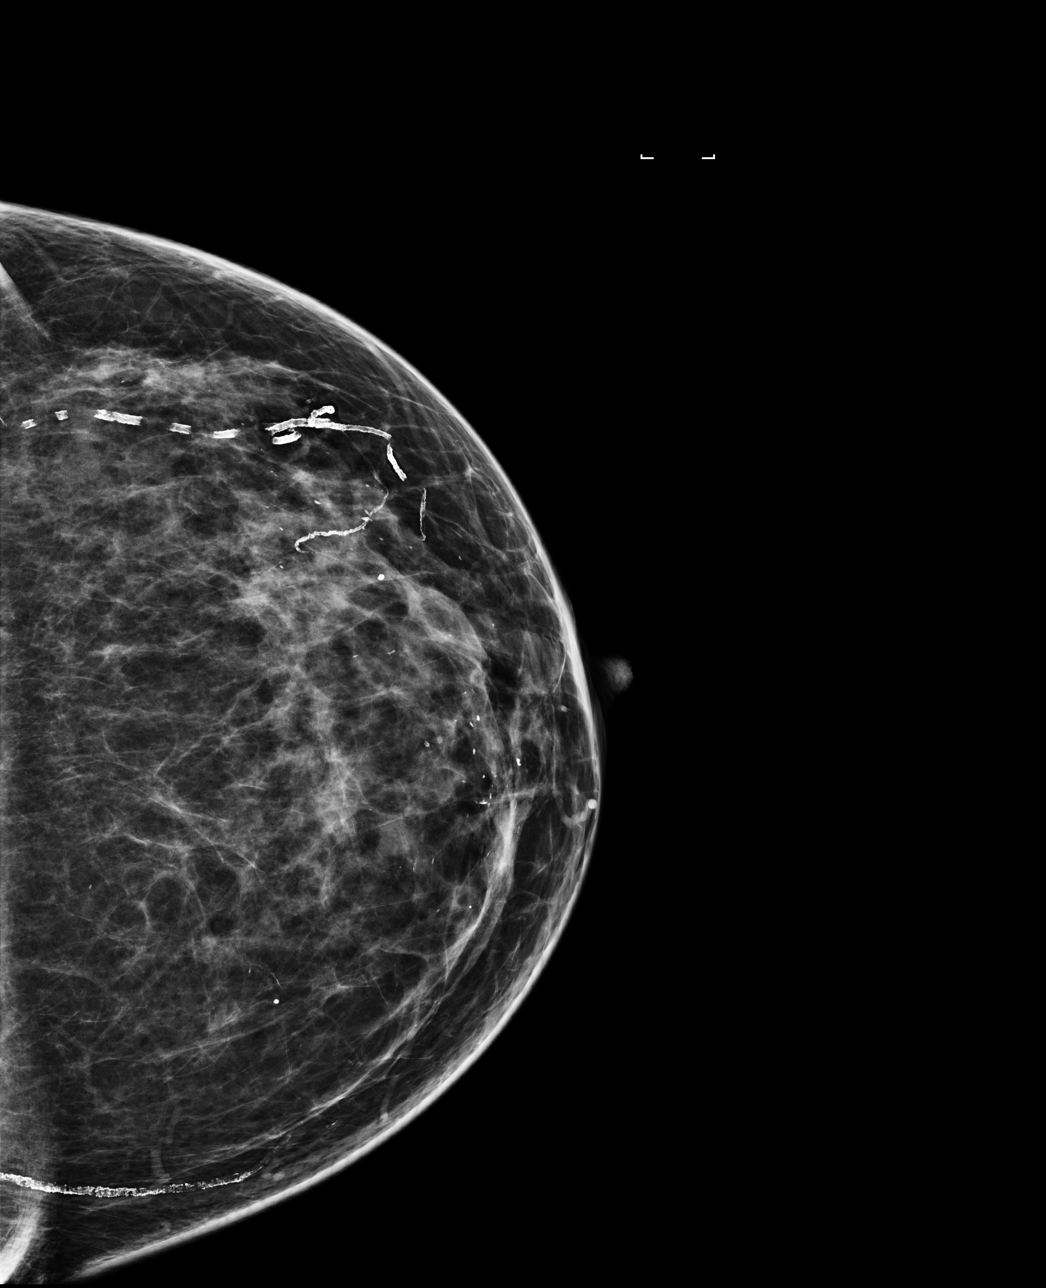

[L MLO]
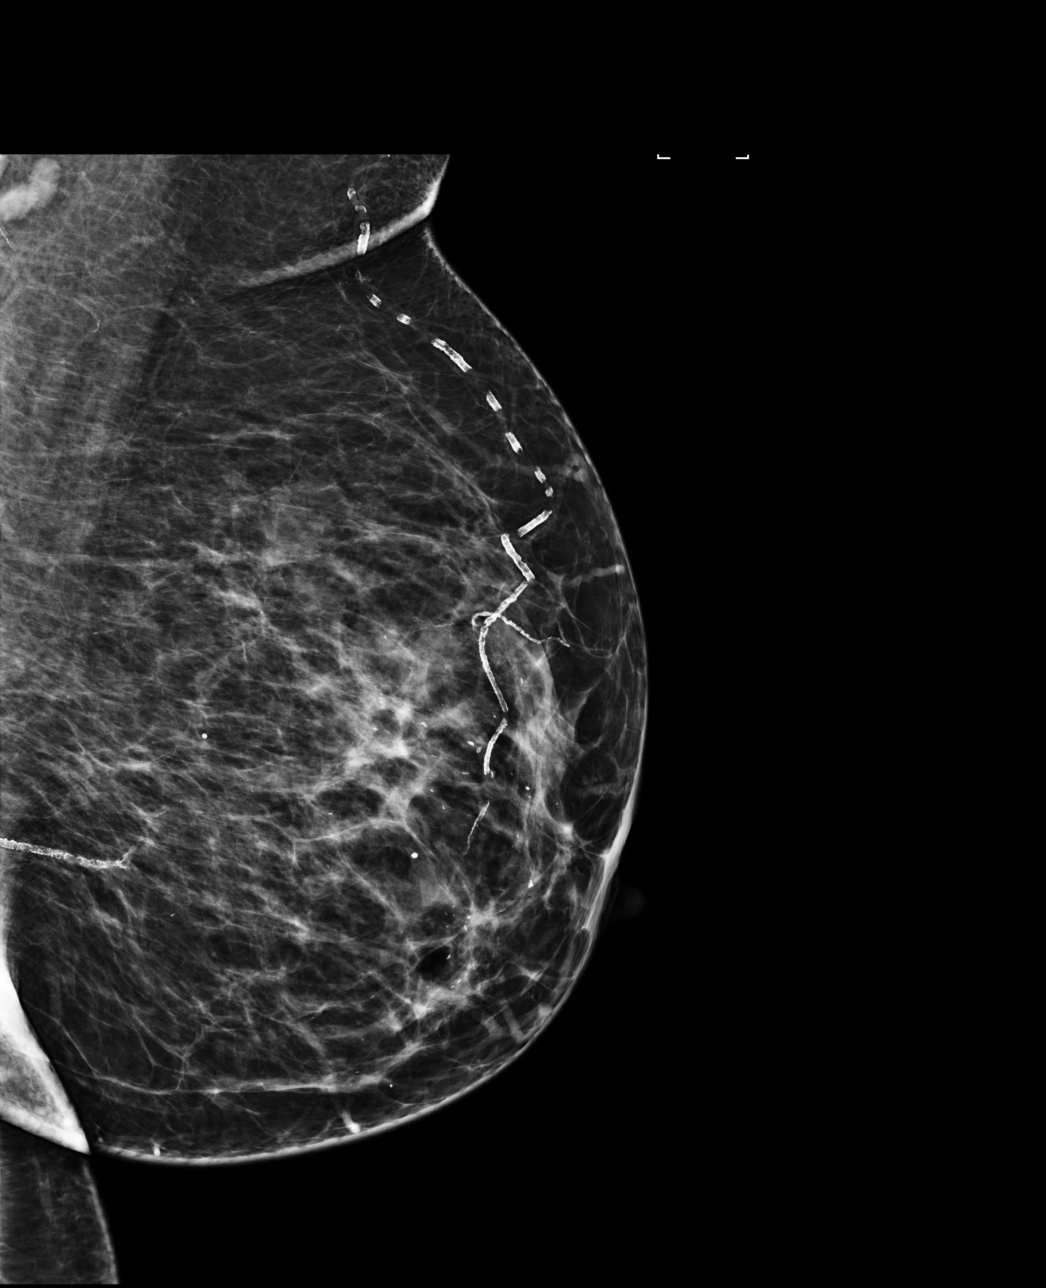

[R CC]
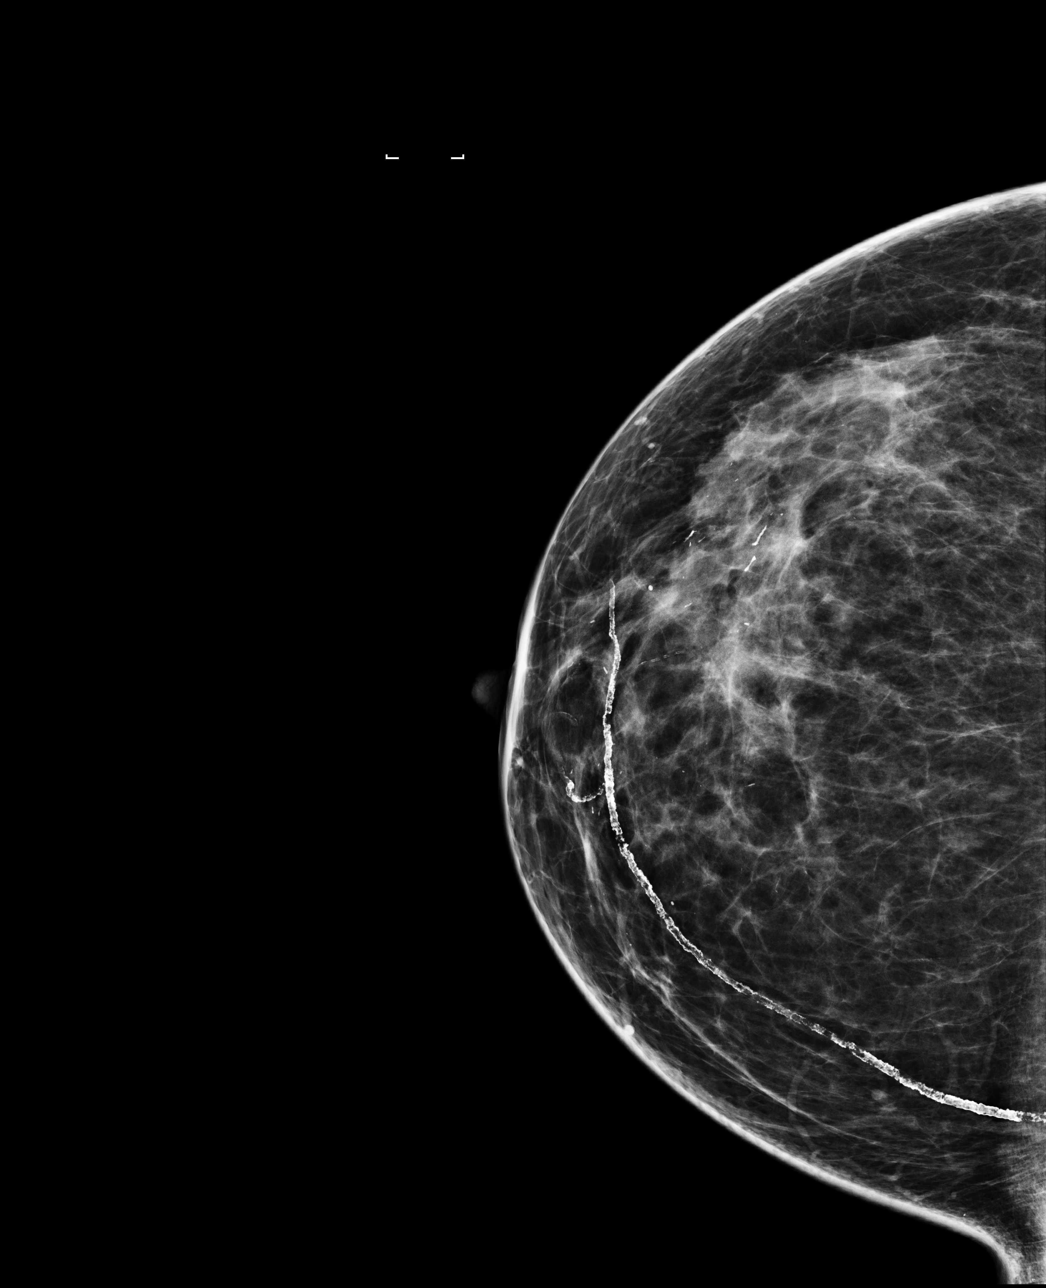

[R MLO]
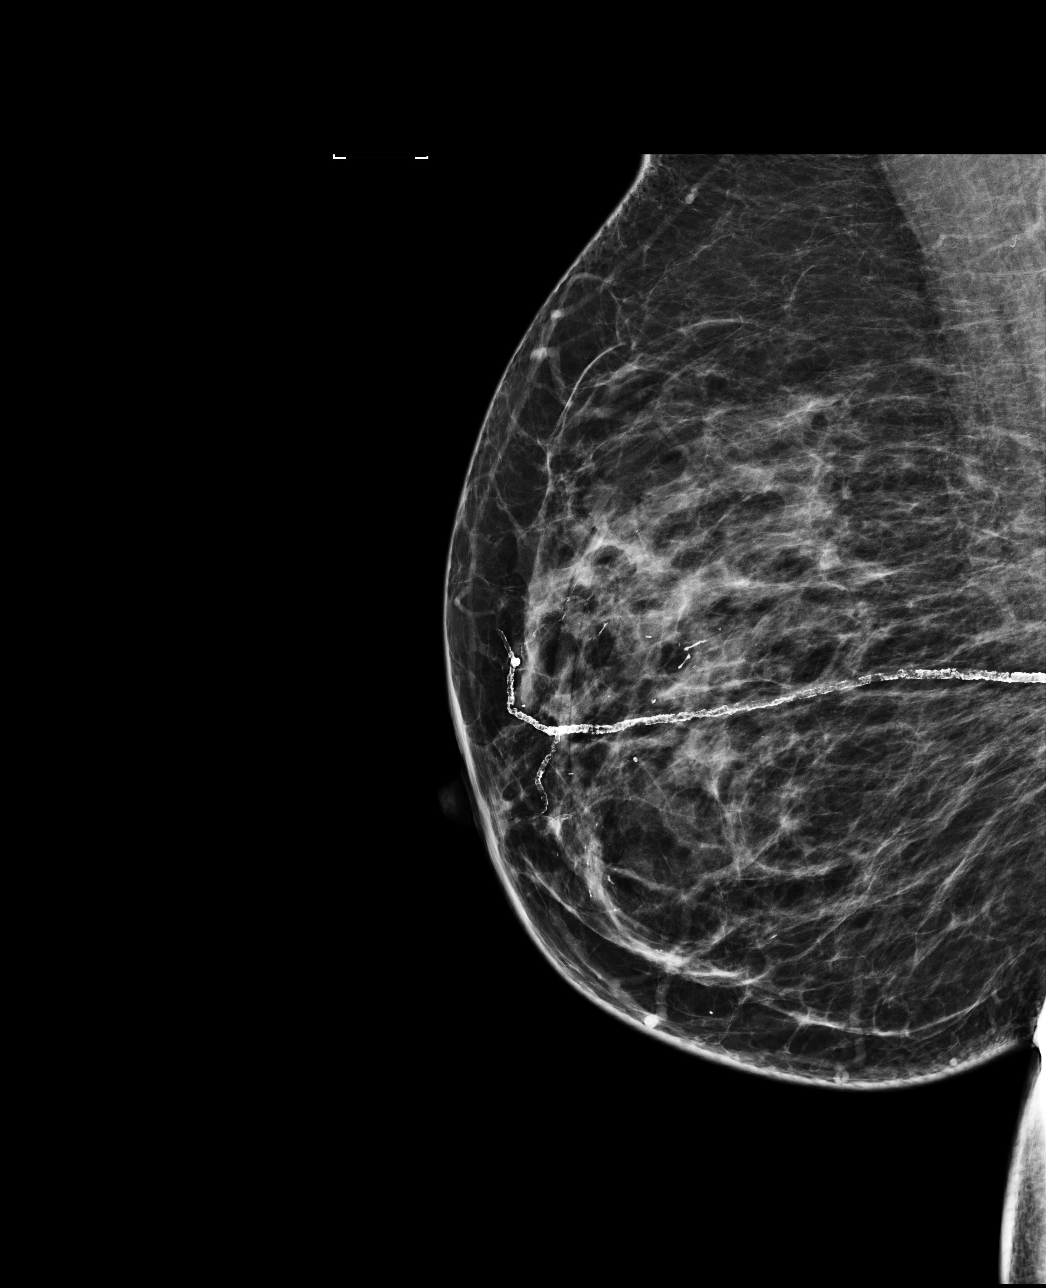

[4 of 4 positions shown; findings below may reference images not displayed]

ACR Breast Density Category c: The breast tissue is heterogeneously
dense, which may obscure small masses.
FINDINGS: There are no findings suspicious for malignancy.
IMPRESSION: No mammographic evidence of malignancy. A result letter of this
screening mammogram will be mailed directly to the patient.

RECOMMENDATION:
Screening mammogram in one year. (Code:TJ-B-ZPA)

BI-RADS CATEGORY  1: Negative.

## 2023-01-04 MED FILL — Medication: Qty: 1 | Status: AC

## 2023-01-09 NOTE — Death Summary Note (Signed)
DEATH SUMMARY   Patient Details  Name: [name] [name] MRN: [id number] DOB: April 02, 1941 MWN:[id number], Melina Schools, MD Admission/Discharge Information   Admit Date:  2022/12/27  Date of Death: Date of Death: 01/10/23  Time of Death: Time of Death: 1737/03/22  Length of Stay: 15   Principle Cause of death: Cardiopulmonary respiratory arrest.  Hospital Diagnoses: Principal Problem:   Acute hypoxemic respiratory failure (HCC) Active Problems:   Chronic kidney disease (CKD), stage IV (severe) (HCC)   Essential hypertension   Mixed hyperlipidemia   Anemia due to chronic kidney disease   Diabetic retinopathy associated with type 2 diabetes mellitus (HCC)   Wound of left leg   End stage renal disease (HCC)   Protein-calorie malnutrition, severe   Hospital Course: [name] [name] is a 82 y.o. female with medical history significant for HFpEF, type 2 diabetes insulin-dependent, CKD stage IV, hypertension, dyslipidemia, chronic anemia of CKD, and prior CVA who presented to the ED with worsening shortness of breath as well as dry cough on the night prior to admission.  Patient was admitted with acute hypoxemic respiratory failure-multifactorial in the setting of volume overload as well as suspected pneumonia.  She was also noted to have worsening AKI on CKD stage IV and nephrology recommended hemodialysis. Family was agreeable and therefore, HD catheter was placed on 9/6.  She underwent a few episodes of hemodialysis, but had some difficulty with hypotension and tolerance.  She was undergoing set up for hemodialysis and began treatment on January 10, 2023 and suddenly was becoming more anxious and then became silent 15 minutes into treatment and had a blank stare.  She was put into Trendelenburg and normal saline bolus was given.  She was essentially unresponsive and then became pulseless and CODE BLUE was activated.  CPR was initiated at 1722 and patient continued to show asystole with no electrical activity.  Despite  multiple rounds of CPR as well as epinephrine and calcium gluconate as given on code sheet, she did not recover her pulse and expired at 1738 on 2023/01/10.  Assessment and Plan:  Acute hypoxemic respiratory failure with possible pneumonia AKI on CKD stage V progressed to ESRD Left lower extremity cellulitis  Procedures: Placement of hemodialysis catheter 9/6, thoracentesis 8/27  Consultations: Nephrology  The results of significant diagnostics from this hospitalization (including imaging, microbiology, ancillary and laboratory) are listed below for reference.   Significant Diagnostic Studies: IR Fluoro Guide CV Line Right  Result Date: 12/15/2022 INDICATION: ESRD requiring HD. Prior dialysis catheter placed by surgery service, inadvertently removed. EXAM: TUNNELED CENTRAL VENOUS HEMODIALYSIS CATHETER PLACEMENT WITH ULTRASOUND AND FLUOROSCOPIC GUIDANCE MEDICATIONS: Ancef 2 gm IV . The antibiotic was given in an appropriate time interval prior to skin puncture. ANESTHESIA/SEDATION: Local anesthetic was employed during this procedure. A total of Benadryl 25 mg was administered intravenously. The patient's level of consciousness and vital signs were monitored continuously by radiology nursing throughout the procedure under my direct supervision. FLUOROSCOPY TIME:  Fluoroscopic dose; 2 mGy COMPLICATIONS: None immediate. PROCEDURE: Informed written consent was obtained from the patient and/or patient's representative after a discussion of the risks, benefits, and alternatives to treatment. Questions regarding the procedure were encouraged and answered. The RIGHT neck and chest were prepped with chlorhexidine in a sterile fashion, and a sterile drape was applied covering the operative field. Maximum barrier sterile technique with sterile gowns and gloves were used for the procedure. A timeout was performed prior to the initiation of the procedure. After creating a small venotomy incision, a micropuncture  kit  was utilized to access the internal jugular vein. Real-time ultrasound guidance was utilized for vascular access including the acquisition of a permanent ultrasound image documenting patency of the accessed vessel. The microwire was utilized to measure appropriate catheter length. A stiff Glidewire was advanced to the level of the IVC and the micropuncture sheath was exchanged for a peel-away sheath. A palindrome tunneled hemodialysis catheter measuring 19 cm from tip to cuff was tunneled in a retrograde fashion from the anterior chest wall to the venotomy incision. The catheter was then placed through the peel-away sheath with tips ultimately positioned within the superior aspect of the right atrium. Final catheter positioning was confirmed and documented with a spot radiographic image. The catheter aspirates and flushes normally. The catheter was flushed with appropriate volume heparin dwells. The catheter exit site was secured with a 2-0 Ethilon retention suture. The venotomy incision was closed with Dermabond. Dressings were applied. The patient tolerated the procedure well without immediate post procedural complication. IMPRESSION: Successful placement of 19 cm tip to cuff tunneled hemodialysis catheter via the RIGHT internal jugular vein The tip of the catheter is positioned within the proximal RIGHT atrium. The catheter is ready for immediate use. Roanna Banning, MD [facility] Radiology Electronically Signed   By: Roanna Banning M.D.   On: 12/15/2022 16:08   IR US Guide Vasc Access Right  Result Date: 12/15/2022 INDICATION: ESRD requiring HD. Prior dialysis catheter placed by surgery service, inadvertently removed. EXAM: TUNNELED CENTRAL VENOUS HEMODIALYSIS CATHETER PLACEMENT WITH ULTRASOUND AND FLUOROSCOPIC GUIDANCE MEDICATIONS: Ancef 2 gm IV . The antibiotic was given in an appropriate time interval prior to skin puncture. ANESTHESIA/SEDATION: Local anesthetic  was employed during this procedure. A total of Benadryl 25 mg was administered intravenously. The patient's level of consciousness and vital signs were monitored continuously by radiology nursing throughout the procedure under my direct supervision. FLUOROSCOPY TIME:  Fluoroscopic dose; 2 mGy COMPLICATIONS: None immediate. PROCEDURE: Informed written consent was obtained from the patient and/or patient's representative after a discussion of the risks, benefits, and alternatives to treatment. Questions regarding the procedure were encouraged and answered. The RIGHT neck and chest were prepped with chlorhexidine in a sterile fashion, and a sterile drape was applied covering the operative field. Maximum barrier sterile technique with sterile gowns and gloves were used for the procedure. A timeout was performed prior to the initiation of the procedure. After creating a small venotomy incision, a micropuncture kit was utilized to access the internal jugular vein. Real-time ultrasound guidance was utilized for vascular access including the acquisition of a permanent ultrasound image documenting patency of the accessed vessel. The microwire was utilized to measure appropriate catheter length. A stiff Glidewire was advanced to the level of the IVC and the micropuncture sheath was exchanged for a peel-away sheath. A palindrome tunneled hemodialysis catheter measuring 19 cm from tip to cuff was tunneled in a retrograde fashion from the anterior chest wall to the venotomy incision. The catheter was then placed through the peel-away sheath with tips ultimately positioned within the superior aspect of the right atrium. Final catheter positioning was confirmed and documented with a spot radiographic image. The catheter aspirates and flushes normally. The catheter was flushed with appropriate volume heparin dwells. The catheter exit site was secured with a 2-0 Ethilon retention suture. The venotomy incision was closed with  Dermabond. Dressings were applied. The patient tolerated the procedure well without immediate post procedural complication. IMPRESSION: Successful placement of 19 cm tip  to cuff tunneled hemodialysis catheter via the RIGHT internal jugular vein The tip of the catheter is positioned within the proximal RIGHT atrium. The catheter is ready for immediate use. Roanna Banning, MD [facility] Radiology Electronically Signed   By: Roanna Banning M.D.   On: 12/15/2022 16:08   DG Chest 1 View  Result Date: 12/14/2022 CLINICAL DATA:  [id number]. Extravasation injury of IV catheter site with other complication. The patient pulled out his right IJ hemodialysis catheter. Admitted for hypoxic respiratory failure. EXAM: CHEST  1 VIEW COMPARISON:  Portable chest 12/11/2022 FINDINGS: 12:53 a.m. interval removal of previous right IJ dialysis catheter. No pneumothorax is seen. Bilateral moderate-sized pleural effusions continue to be noted with overlying consolidation or atelectasis of the lower lung fields. The heart is enlarged. Perihilar vascular congestion and mild central interstitial edema appear similar. The upper lung fields remain clear. The mediastinum is stable with aortic atherosclerosis. Overall aeration is unchanged with no new abnormality. No new osseous findings. IMPRESSION: 1. Interval removal of previous right IJ dialysis catheter. No pneumothorax. 2. Stable cardiomegaly, perihilar vascular congestion and mild central interstitial edema. 3. Stable moderate-sized bilateral pleural effusions with overlying consolidation or atelectasis in the lower lung fields. Electronically Signed   By: Almira Bar M.D.   On: 12/14/2022 01:29   DG CHEST PORT 1 VIEW  Result Date: 12/11/2022 CLINICAL DATA:  Increased shortness of breath. EXAM: PORTABLE CHEST 1 VIEW COMPARISON:  Radiographs 11/26/2022 and 12/06/2022. FINDINGS: 0802 hours. Right IJ hemodialysis catheter projects to the level  of the superior cavoatrial junction. The heart size and mediastinal contours are stable with aortic atherosclerosis. There are increased bibasilar pulmonary opacities with probable enlarging pleural effusions and mild pulmonary edema. No pneumothorax. The bones appear unchanged. Telemetry leads overlie the chest. IMPRESSION: Increasing bibasilar pulmonary opacities, likely a combination of enlarging pleural effusions and pulmonary edema. Findings suggest congestive heart failure. Electronically Signed   By: Carey Bullocks M.D.   On: 12/11/2022 11:33   DG Chest Port 1 View  Result Date: 11/18/2022 CLINICAL DATA:  Dialysis catheter placement. EXAM: PORTABLE CHEST 1 VIEW COMPARISON:  12/06/2022 FINDINGS: Right IJ central line tip overlies the distal SVC level. No evidence for pneumothorax. Diffuse vascular congestion with interstitial pulmonary edema, progressive in the interval. Bibasilar collapse/consolidation with small right and tiny left pleural effusions. The cardio pericardial silhouette is enlarged. IMPRESSION: 1. Right IJ central line tip overlies the distal SVC level. No pneumothorax. 2. Progressive vascular congestion and interstitial pulmonary edema. 3. Bibasilar collapse/consolidation with small right and tiny left pleural effusions. Electronically Signed   By: Kennith Center M.D.   On: 11/30/2022 13:38   DG C-Arm 1-60 Min-No Report  Result Date: 11/13/2022 Fluoroscopy was utilized by the requesting physician.  No radiographic interpretation.   US THORACENTESIS ASP PLEURAL SPACE W/IMG GUIDE  Result Date: 12/06/2022 INDICATION: Symptomatic LEFT sided pleural effusion EXAM: US THORACENTESIS ASP PLEURAL SPACE W/IMG GUIDE COMPARISON:  Chest XR, 11/18/2022. MEDICATIONS: None. COMPLICATIONS: None immediate. TECHNIQUE: Informed written consent was obtained from the patient and/or patient's representative after a discussion of the risks, benefits and alternatives to treatment. A timeout was performed  prior to the initiation of the procedure. Initial ultrasound scanning demonstrates a small-to-moderate volume LEFT pleural effusion. The lower chest was prepped and draped in the usual sterile fashion. 1% lidocaine was used for local anesthesia. An ultrasound image was saved for documentation purposes. An 8 Fr Safe-T-Centesis catheter was introduced. The thoracentesis was performed. The  catheter was removed and a dressing was applied. The patient tolerated the procedure well without immediate post procedural complication. The patient was escorted to have an upright chest radiograph. FINDINGS: A total of approximately 700 mL of serous pleural fluid was removed. Requested samples were sent to the laboratory. IMPRESSION: 1. Successful ultrasound-guided diagnostic and therapeutic LEFT sided thoracentesis yielding 700 mL of pleural fluid. 2. A small volume pleural effusion is present on the contralateral RIGHT chest. Roanna Banning, MD [facility] Radiology Electronically Signed   By: Roanna Banning M.D.   On: 12/06/2022 13:05   DG Chest 1 View  Result Date: 12/06/2022 CLINICAL DATA:  Status post left thoracentesis. EXAM: CHEST  1 VIEW COMPARISON:  11/23/2022 FINDINGS: Stable cardiomediastinal contours. Aortic atherosclerosis. No significant pneumothorax identified status post left thoracentesis. Significant decreased volume of left pleural effusion with improved aeration to the right base. Unchanged small right effusion. IMPRESSION: 1. No significant pneumothorax identified status post left thoracentesis. 2. Significant decreased volume of left pleural effusion with improved aeration to the left base. Electronically Signed   By: Signa Kell M.D.   On: 12/06/2022 10:09   ECHOCARDIOGRAM COMPLETE  Result Date: 12/05/2022    ECHOCARDIOGRAM REPORT   Patient Name:   NIJA WENDLAND Date of Exam: 12/05/2022 Medical Rec #:  [id number]         Height:       62.0 in Accession  #:    [id number]        Weight:       142.0 lb Date of Birth:  [birth year]         BSA:          1.653 m Patient Age:    82 years          BP:           164/66 mmHg Patient Gender: F                 HR:           68 bpm. [facility] Procedure: 2D Echo, Cardiac Doppler, Color Doppler, 3D Echo and Strain Analysis Indications:    CHF-Acute Diastolic I50.31  History:        Patient has prior history of Echocardiogram examinations, most                 recent 12/02/2021. CHF, Pulmonary HTN; Risk Factors:Hypertension,                 Dyslipidemia, Diabetes and Non-Smoker.  Sonographer:    Aron Baba Referring Phys: [id number] Seth Higginbotham D [facility]  Sonographer Comments: Image acquisition challenging due to respiratory motion. Global longitudinal strain was attempted. IMPRESSIONS  1. Left ventricular ejection fraction, by estimation, is 35%. The left ventricle has moderately decreased function. The left ventricle demonstrates regional wall motion abnormalities (see scoring diagram/findings for description). There is mild left ventricular hypertrophy. Left ventricular diastolic parameters are consistent with Grade II diastolic dysfunction (pseudonormalization). Elevated left atrial pressure.  2. Right ventricular systolic function is normal. The right ventricular size is normal. There is severely elevated pulmonary artery systolic pressure.  3. Left atrial size was moderately dilated.  4. Large pleural effusion in the left lateral region.  5. The mitral valve is abnormal. Moderate mitral valve regurgitation. Mild mitral stenosis.  6. The tricuspid valve is abnormal. Tricuspid valve regurgitation is moderate.  7. The aortic valve is tricuspid. There is mild calcification of the aortic  valve. There is mild thickening of the aortic valve. Aortic valve regurgitation is not visualized. No aortic stenosis is present.  8. The inferior vena cava is normal in size with <50% respiratory variability, suggesting right atrial  pressure of 8 mmHg. FINDINGS  Left Ventricle: Left ventricular ejection fraction, by estimation, is 35%. The left ventricle has moderately decreased function. The left ventricle demonstrates regional wall motion abnormalities. The left ventricular internal cavity size was normal in size. There is mild left ventricular hypertrophy. Left ventricular diastolic parameters are consistent with Grade II diastolic dysfunction (pseudonormalization). Elevated left atrial pressure.  LV Wall Scoring: The apical septal segment and apex are akinetic. The anterior septum, mid inferoseptal segment, and basal inferoseptal segment are hypokinetic. Right Ventricle: The right ventricular size is normal. Right vetricular wall thickness was not well visualized. Right ventricular systolic function is normal. There is severely elevated pulmonary artery systolic pressure. The tricuspid regurgitant velocity is 3.66 m/s, and with an assumed right atrial pressure of 8 mmHg, the estimated right ventricular systolic pressure is 61.6 mmHg. Left Atrium: Left atrial size was moderately dilated. Right Atrium: Right atrial size was normal in size. Pericardium: There is no evidence of pericardial effusion. Mitral Valve: The mitral valve is abnormal. There is mild thickening of the mitral valve leaflet(s). There is mild calcification of the mitral valve leaflet(s). Mild mitral annular calcification. Moderate mitral valve regurgitation. Mild mitral valve stenosis. The mean mitral valve gradient is 3.0 mmHg. Tricuspid Valve: The tricuspid valve is abnormal. Tricuspid valve regurgitation is moderate . No evidence of tricuspid stenosis. Aortic Valve: The aortic valve is tricuspid. There is mild calcification of the aortic valve. There is mild thickening of the aortic valve. There is mild aortic valve annular calcification. Aortic valve regurgitation is not visualized. No aortic stenosis  is present. Aortic valve mean gradient measures 5.7 mmHg. Aortic  valve peak gradient measures 12.3 mmHg. Aortic valve area, by VTI measures 1.41 cm. Pulmonic Valve: The pulmonic valve was not well visualized. Pulmonic valve regurgitation is mild. No evidence of pulmonic stenosis. Aorta: The aortic root is normal in size and structure. Venous: The inferior vena cava is normal in size with less than 50% respiratory variability, suggesting right atrial pressure of 8 mmHg. IAS/Shunts: No atrial level shunt detected by color flow Doppler. Additional Comments: There is a large pleural effusion in the left lateral region.  LEFT VENTRICLE PLAX 2D LVIDd:         4.30 cm     Diastology LVIDs:         3.60 cm     LV e' medial:    3.77 cm/s LV PW:         1.20 cm     LV E/e' medial:  45.6 LV IVS:        1.10 cm     LV e' lateral:   7.89 cm/s LVOT diam:     1.80 cm     LV E/e' lateral: 21.8 LV SV:         49 LV SV Index:   30 LVOT Area:     2.54 cm                             3D Volume EF: LV Volumes (MOD)           3D EF:        49 % LV vol d, MOD A2C: 90.4 ml LV EDV:  142 ml LV vol d, MOD A4C: 87.1 ml LV ESV:       72 ml LV vol s, MOD A2C: 50.8 ml LV SV:        70 ml LV vol s, MOD A4C: 42.1 ml LV SV MOD A2C:     39.6 ml LV SV MOD A4C:     87.1 ml LV SV MOD BP:      43.3 ml RIGHT VENTRICLE RV S prime:     12.70 cm/s TAPSE (M-mode): 1.7 cm LEFT ATRIUM             Index        RIGHT ATRIUM           Index LA diam:        4.00 cm 2.42 cm/m   RA Area:     13.10 cm LA Vol (A2C):   89.5 ml 54.15 ml/m  RA Volume:   33.00 ml  19.97 ml/m LA Vol (A4C):   59.7 ml 36.12 ml/m LA Biplane Vol: 74.9 ml 45.32 ml/m  AORTIC VALVE                     PULMONIC VALVE AV Area (Vmax):    1.42 cm      PR End Diast Vel: 7.51 msec AV Area (Vmean):   1.51 cm AV Area (VTI):     1.41 cm AV Vmax:           175.57 cm/s AV Vmean:          110.256 cm/s AV VTI:            0.346 m AV Peak Grad:      12.3 mmHg AV Mean Grad:      5.7 mmHg LVOT Vmax:         98.20 cm/s LVOT Vmean:        65.600 cm/s LVOT VTI:           0.192 m LVOT/AV VTI ratio: 0.55  AORTA Ao Root diam: 3.20 cm Ao Asc diam:  3.00 cm MITRAL VALVE                TRICUSPID VALVE MV Area (PHT): 2.96 cm     TR Peak grad:   53.6 mmHg MV Mean grad:  3.0 mmHg     TR Vmax:        366.00 cm/s MV Decel Time: 256 msec MR Peak grad: 107.5 mmHg    SHUNTS MR Mean grad: 80.0 mmHg     Systemic VTI:  0.19 m MR Vmax:      518.50 cm/s   Systemic Diam: 1.80 cm MR Vmean:     428.0 cm/s MR PISA:      1.57 cm MV E velocity: 172.00 cm/s MV A velocity: 132.00 cm/s MV E/A ratio:  1.30 Dina Rich MD Electronically signed by Dina Rich MD Signature Date/Time: 12/05/2022/3:48:21 PM    Final    US RENAL  Result Date: 12/05/2022 CLINICAL DATA:  Acute renal insufficiency EXAM: RENAL / URINARY TRACT ULTRASOUND COMPLETE COMPARISON:  05/19/2020 FINDINGS: Right Kidney: Renal measurements: 10.8 x 6.1 x 4.2 cm = volume: 146.5 ML. Echogenicity within normal limits. No mass or hydronephrosis visualized. Left Kidney: Renal measurements: 9.4 x 4.9 x 3.9 cm = volume: 94.0 mL. Echogenicity within normal limits. No mass or hydronephrosis visualized. Bladder: Appears normal for degree of bladder distention. Other: Bilateral pleural effusions are incidentally noted, left greater than right. IMPRESSION: 1.  Incidental bilateral pleural effusions, left greater than right. 2. Otherwise unremarkable renal ultrasound. Electronically Signed   By: Sharlet Salina M.D.   On: 12/05/2022 14:50   US Venous Img Lower  Left (DVT Study)  Result Date: 12/01/2022 CLINICAL DATA:  Short of breath, left lower extremity wound EXAM: LEFT LOWER EXTREMITY VENOUS DOPPLER ULTRASOUND TECHNIQUE: Gray-scale sonography with compression, as well as color and duplex ultrasound, were performed to evaluate the deep venous system(s) from the level of the common femoral vein through the popliteal and proximal calf veins. COMPARISON:  None Available. FINDINGS: VENOUS Normal compressibility of the common femoral, superficial  femoral, and popliteal veins, as well as the visualized calf veins. Visualized portions of profunda femoral vein and great saphenous vein unremarkable. No filling defects to suggest DVT on grayscale or color Doppler imaging. Doppler waveforms show normal direction of venous flow, normal respiratory plasticity and response to augmentation. Limited views of the contralateral common femoral vein are unremarkable. OTHER None. Limitations: none IMPRESSION: Negative. Electronically Signed   By: Malachy Moan M.D.   On: 12/01/2022 13:23   DG Chest Portable 1 View  Result Date: 11/14/2022 CLINICAL DATA:  Shortness of breath.  History of CHF. EXAM: PORTABLE CHEST 1 VIEW COMPARISON:  06/02/2022. FINDINGS: There is a left retrocardiac airspace opacity obscuring the left hemidiaphragm, descending thoracic aorta and blunting the left lateral costophrenic angle suggesting combination of left lower lobe atelectasis and/or consolidation with pleural effusion. Bilateral lung fields are otherwise clear. Right lateral costophrenic angle is also clear. Stable cardio-mediastinal silhouette. No acute osseous abnormalities. The soft tissues are within normal limits. IMPRESSION: 1. Left lower lobe atelectasis and/or consolidation with pleural effusion. Electronically Signed   By: Jules Schick M.D.   On: 11/23/2022 13:14   DG Tibia/Fibula Left  Result Date: 12/02/2022 CLINICAL DATA:  Mid lower leg wound for 10 days EXAM: LEFT TIBIA AND FIBULA - 2 VIEW COMPARISON:  None Available. FINDINGS: There is no evidence of fracture or other focal bone lesions. Osteopenia. Vascular calcifications. Mild soft tissue swelling if there is further concern of bone or soft tissue infection, MRI or bone scan could be performed for further sensitivity IMPRESSION: No acute osseous abnormality.  Mild soft tissue swelling Electronically Signed   By: Karen Kays M.D.   On: 12/02/2022 19:35    Microbiology: No results found for this or any  previous visit (from the past 240 hour(s)).  Time spent: 40 minutes  Signed: Erick Blinks, DO 01/05/2023

## 2023-01-09 NOTE — ED Provider Notes (Signed)
I was the emergency physician in the [facility] emergency department.  A CODE BLUE was called overhead.  I gathered the code supplies, and went to the second floor with our charge nurse.  Upon arrival to the room, staff was performing chest compressions, the hospitalist was directing resuscitation efforts.  I obtained a quick history from the hospitalist and the patient's nurse.  Patient was an 82 year old female, recently started on dialysis.  At this point during the code, patient had been in asystole, compressions have been ongoing for approximately 5 minutes.  Patient received their first epinephrine as directed by hospitalist.  Hospitalist then left the room to confer with family who is outside of the room.  I assumed leadership role of code.  I directed staff to provide the patient with calcium.  Patient remained in asystole.  I assessed the patient's airway.  Due to the set up of the room, bed would need to be moved several feet in order for definitive airway to be placed.  Discussed this with the hospitalist, and with the nature of the patient's code, airway was likely not the primary cause.  Patient was bagging well, grabbing no issues with oxygenation.  I performed a jaw thrust with assistance of respiratory therapy.  Unfortunately, the patient remained in a prolonged period of asystole, no point or pulses or organized rhythm obtained.  Hospitalist called time of death.   Anders Simmonds T, DO 12/11/2022 [id number]

## 2023-01-09 DEATH — deceased

## 2023-01-12 ENCOUNTER — Ambulatory Visit (HOSPITAL_BASED_OUTPATIENT_CLINIC_OR_DEPARTMENT_OTHER): Payer: Medicare Other | Admitting: Family

## 2023-04-18 ENCOUNTER — Encounter: Payer: Medicare Other | Admitting: Internal Medicine
